# Patient Record
Sex: Female | Born: 1955 | Race: Black or African American | Hispanic: No | Marital: Single | Smoking: Former smoker
Health system: Southern US, Community
[De-identification: ages and names within clinical notes are randomized; demographics above are authoritative.]

## PROBLEM LIST (undated history)

## (undated) DIAGNOSIS — Z9221 Personal history of antineoplastic chemotherapy: Secondary | ICD-10-CM

## (undated) DIAGNOSIS — Z972 Presence of dental prosthetic device (complete) (partial): Secondary | ICD-10-CM

## (undated) DIAGNOSIS — K219 Gastro-esophageal reflux disease without esophagitis: Secondary | ICD-10-CM

## (undated) DIAGNOSIS — E7800 Pure hypercholesterolemia, unspecified: Secondary | ICD-10-CM

## (undated) DIAGNOSIS — Z853 Personal history of malignant neoplasm of breast: Secondary | ICD-10-CM

## (undated) DIAGNOSIS — M5030 Other cervical disc degeneration, unspecified cervical region: Secondary | ICD-10-CM

## (undated) DIAGNOSIS — C50919 Malignant neoplasm of unspecified site of unspecified female breast: Secondary | ICD-10-CM

## (undated) DIAGNOSIS — Z8489 Family history of other specified conditions: Secondary | ICD-10-CM

## (undated) DIAGNOSIS — R0602 Shortness of breath: Secondary | ICD-10-CM

## (undated) DIAGNOSIS — H269 Unspecified cataract: Secondary | ICD-10-CM

## (undated) DIAGNOSIS — R0989 Other specified symptoms and signs involving the circulatory and respiratory systems: Secondary | ICD-10-CM

## (undated) DIAGNOSIS — J449 Chronic obstructive pulmonary disease, unspecified: Secondary | ICD-10-CM

## (undated) DIAGNOSIS — K08109 Complete loss of teeth, unspecified cause, unspecified class: Secondary | ICD-10-CM

## (undated) DIAGNOSIS — R05 Cough: Secondary | ICD-10-CM

## (undated) HISTORY — DX: Chronic obstructive pulmonary disease, unspecified: J44.9

## (undated) HISTORY — DX: Gastro-esophageal reflux disease without esophagitis: K21.9

## (undated) HISTORY — PX: COLONOSCOPY: SHX174

## (undated) HISTORY — PX: REDUCTION MAMMAPLASTY: SUR839

## (undated) HISTORY — PX: TUBAL LIGATION: SHX77

## (undated) SURGERY — RECONSTRUCTION, BREAST
Anesthesia: General | Laterality: Right

---

## 1995-09-24 DIAGNOSIS — Z9221 Personal history of antineoplastic chemotherapy: Secondary | ICD-10-CM

## 1995-09-24 HISTORY — PX: BREAST SURGERY: SHX581

## 1995-09-24 HISTORY — DX: Personal history of antineoplastic chemotherapy: Z92.21

## 1995-09-24 HISTORY — PX: MASTECTOMY, PARTIAL: SHX709

## 1997-11-17 ENCOUNTER — Ambulatory Visit (HOSPITAL_COMMUNITY): Admission: RE | Admit: 1997-11-17 | Discharge: 1997-11-17 | Payer: Self-pay | Admitting: General Surgery

## 1998-04-22 ENCOUNTER — Emergency Department (HOSPITAL_COMMUNITY): Admission: EM | Admit: 1998-04-22 | Discharge: 1998-04-22 | Payer: Self-pay | Admitting: Emergency Medicine

## 1998-11-13 ENCOUNTER — Ambulatory Visit (HOSPITAL_COMMUNITY): Admission: RE | Admit: 1998-11-13 | Discharge: 1998-11-13 | Payer: Self-pay | Admitting: General Surgery

## 1998-11-13 ENCOUNTER — Encounter (HOSPITAL_BASED_OUTPATIENT_CLINIC_OR_DEPARTMENT_OTHER): Payer: Self-pay | Admitting: General Surgery

## 2001-03-20 ENCOUNTER — Ambulatory Visit (HOSPITAL_COMMUNITY): Admission: RE | Admit: 2001-03-20 | Discharge: 2001-03-20 | Payer: Self-pay | Admitting: General Surgery

## 2001-03-20 ENCOUNTER — Encounter (HOSPITAL_BASED_OUTPATIENT_CLINIC_OR_DEPARTMENT_OTHER): Payer: Self-pay | Admitting: General Surgery

## 2001-08-25 ENCOUNTER — Emergency Department (HOSPITAL_COMMUNITY): Admission: EM | Admit: 2001-08-25 | Discharge: 2001-08-25 | Payer: Self-pay | Admitting: Emergency Medicine

## 2002-01-02 ENCOUNTER — Emergency Department (HOSPITAL_COMMUNITY): Admission: EM | Admit: 2002-01-02 | Discharge: 2002-01-02 | Payer: Self-pay | Admitting: *Deleted

## 2002-01-02 ENCOUNTER — Encounter: Payer: Self-pay | Admitting: *Deleted

## 2002-01-04 ENCOUNTER — Emergency Department (HOSPITAL_COMMUNITY): Admission: EM | Admit: 2002-01-04 | Discharge: 2002-01-04 | Payer: Self-pay

## 2002-05-19 ENCOUNTER — Encounter (HOSPITAL_BASED_OUTPATIENT_CLINIC_OR_DEPARTMENT_OTHER): Payer: Self-pay | Admitting: General Surgery

## 2002-05-19 ENCOUNTER — Encounter: Admission: RE | Admit: 2002-05-19 | Discharge: 2002-05-19 | Payer: Self-pay | Admitting: General Surgery

## 2004-11-14 ENCOUNTER — Emergency Department (HOSPITAL_COMMUNITY): Admission: EM | Admit: 2004-11-14 | Discharge: 2004-11-14 | Payer: Self-pay | Admitting: Family Medicine

## 2006-04-18 ENCOUNTER — Inpatient Hospital Stay (HOSPITAL_COMMUNITY): Admission: EM | Admit: 2006-04-18 | Discharge: 2006-04-21 | Payer: Self-pay | Admitting: Emergency Medicine

## 2006-11-02 ENCOUNTER — Emergency Department (HOSPITAL_COMMUNITY): Admission: EM | Admit: 2006-11-02 | Discharge: 2006-11-02 | Payer: Self-pay | Admitting: Emergency Medicine

## 2006-12-16 ENCOUNTER — Emergency Department (HOSPITAL_COMMUNITY): Admission: EM | Admit: 2006-12-16 | Discharge: 2006-12-16 | Payer: Self-pay | Admitting: Family Medicine

## 2006-12-18 ENCOUNTER — Encounter: Admission: RE | Admit: 2006-12-18 | Discharge: 2006-12-18 | Payer: Self-pay | Admitting: Family Medicine

## 2007-07-06 ENCOUNTER — Emergency Department (HOSPITAL_COMMUNITY): Admission: EM | Admit: 2007-07-06 | Discharge: 2007-07-06 | Payer: Self-pay | Admitting: Emergency Medicine

## 2008-01-05 ENCOUNTER — Encounter: Admission: RE | Admit: 2008-01-05 | Discharge: 2008-01-05 | Payer: Self-pay | Admitting: General Surgery

## 2008-01-09 ENCOUNTER — Emergency Department (HOSPITAL_COMMUNITY): Admission: EM | Admit: 2008-01-09 | Discharge: 2008-01-09 | Payer: Self-pay | Admitting: Emergency Medicine

## 2008-01-11 ENCOUNTER — Emergency Department (HOSPITAL_COMMUNITY): Admission: EM | Admit: 2008-01-11 | Discharge: 2008-01-11 | Payer: Self-pay | Admitting: Emergency Medicine

## 2008-01-20 ENCOUNTER — Emergency Department (HOSPITAL_COMMUNITY): Admission: EM | Admit: 2008-01-20 | Discharge: 2008-01-20 | Payer: Self-pay | Admitting: Emergency Medicine

## 2009-01-08 ENCOUNTER — Emergency Department (HOSPITAL_COMMUNITY): Admission: EM | Admit: 2009-01-08 | Discharge: 2009-01-08 | Payer: Self-pay | Admitting: Emergency Medicine

## 2009-12-22 ENCOUNTER — Emergency Department (HOSPITAL_COMMUNITY): Admission: EM | Admit: 2009-12-22 | Discharge: 2009-12-22 | Payer: Self-pay | Admitting: Emergency Medicine

## 2010-07-27 ENCOUNTER — Observation Stay (HOSPITAL_COMMUNITY): Admission: EM | Admit: 2010-07-27 | Discharge: 2010-07-27 | Payer: Self-pay | Admitting: Emergency Medicine

## 2010-11-04 ENCOUNTER — Emergency Department (HOSPITAL_COMMUNITY)
Admission: EM | Admit: 2010-11-04 | Discharge: 2010-11-04 | Disposition: A | Discharge status: Home / Self Care | Payer: Self-pay | Attending: Emergency Medicine | Admitting: Emergency Medicine

## 2010-11-04 ENCOUNTER — Emergency Department (HOSPITAL_COMMUNITY): Payer: Self-pay

## 2010-11-04 DIAGNOSIS — J4489 Other specified chronic obstructive pulmonary disease: Secondary | ICD-10-CM | POA: Insufficient documentation

## 2010-11-04 DIAGNOSIS — R0609 Other forms of dyspnea: Secondary | ICD-10-CM | POA: Insufficient documentation

## 2010-11-04 DIAGNOSIS — R05 Cough: Secondary | ICD-10-CM | POA: Insufficient documentation

## 2010-11-04 DIAGNOSIS — Z853 Personal history of malignant neoplasm of breast: Secondary | ICD-10-CM | POA: Insufficient documentation

## 2010-11-04 DIAGNOSIS — R0602 Shortness of breath: Secondary | ICD-10-CM | POA: Insufficient documentation

## 2010-11-04 DIAGNOSIS — J449 Chronic obstructive pulmonary disease, unspecified: Secondary | ICD-10-CM | POA: Insufficient documentation

## 2010-11-04 DIAGNOSIS — R64 Cachexia: Secondary | ICD-10-CM | POA: Insufficient documentation

## 2010-11-04 DIAGNOSIS — R059 Cough, unspecified: Secondary | ICD-10-CM | POA: Insufficient documentation

## 2010-11-04 DIAGNOSIS — R0989 Other specified symptoms and signs involving the circulatory and respiratory systems: Secondary | ICD-10-CM | POA: Insufficient documentation

## 2010-11-04 LAB — POCT I-STAT, CHEM 8
BUN: 16 mg/dL (ref 6–23)
Chloride: 105 mEq/L (ref 96–112)
HCT: 50 % — ABNORMAL HIGH (ref 36.0–46.0)
Potassium: 4.1 mEq/L (ref 3.5–5.1)
Sodium: 140 mEq/L (ref 135–145)

## 2010-11-15 ENCOUNTER — Emergency Department (HOSPITAL_COMMUNITY)
Admission: EM | Admit: 2010-11-15 | Discharge: 2010-11-15 | Disposition: A | Discharge status: Home / Self Care | Payer: No Typology Code available for payment source | Attending: Emergency Medicine | Admitting: Emergency Medicine

## 2010-11-15 ENCOUNTER — Emergency Department (HOSPITAL_COMMUNITY): Payer: No Typology Code available for payment source

## 2010-11-15 DIAGNOSIS — R071 Chest pain on breathing: Secondary | ICD-10-CM | POA: Insufficient documentation

## 2010-11-15 DIAGNOSIS — S139XXA Sprain of joints and ligaments of unspecified parts of neck, initial encounter: Secondary | ICD-10-CM | POA: Insufficient documentation

## 2010-11-15 DIAGNOSIS — M25519 Pain in unspecified shoulder: Secondary | ICD-10-CM | POA: Insufficient documentation

## 2010-11-15 DIAGNOSIS — Y92410 Unspecified street and highway as the place of occurrence of the external cause: Secondary | ICD-10-CM | POA: Insufficient documentation

## 2010-11-20 ENCOUNTER — Emergency Department (HOSPITAL_COMMUNITY)
Admission: EM | Admit: 2010-11-20 | Discharge: 2010-11-20 | Disposition: A | Discharge status: Home / Self Care | Payer: No Typology Code available for payment source | Attending: Emergency Medicine | Admitting: Emergency Medicine

## 2010-11-20 DIAGNOSIS — R109 Unspecified abdominal pain: Secondary | ICD-10-CM | POA: Insufficient documentation

## 2010-11-20 LAB — URINALYSIS, ROUTINE W REFLEX MICROSCOPIC
Ketones, ur: NEGATIVE mg/dL
Leukocytes, UA: NEGATIVE
Nitrite: NEGATIVE
Specific Gravity, Urine: 1.026 (ref 1.005–1.030)
Urine Glucose, Fasting: NEGATIVE mg/dL
pH: 5 (ref 5.0–8.0)

## 2010-11-20 LAB — URINE MICROSCOPIC-ADD ON

## 2010-11-21 LAB — URINE CULTURE: Culture  Setup Time: 201202290133

## 2010-12-04 LAB — DIFFERENTIAL
Lymphocytes Relative: 41 % (ref 12–46)
Lymphs Abs: 1.7 10*3/uL (ref 0.7–4.0)
Neutrophils Relative %: 46 % (ref 43–77)

## 2010-12-04 LAB — BASIC METABOLIC PANEL
Calcium: 8.9 mg/dL (ref 8.4–10.5)
Chloride: 111 mEq/L (ref 96–112)
Creatinine, Ser: 0.8 mg/dL (ref 0.4–1.2)
GFR calc Af Amer: >60 mL/min (ref >60)
Sodium: 143 mEq/L (ref 135–145)

## 2010-12-04 LAB — CBC
Hemoglobin: 12.7 g/dL (ref 12.0–15.0)
MCV: 79.1 fL (ref 78.0–100.0)
Platelets: 214 10*3/uL (ref 150–400)
RBC: 4.94 MIL/uL (ref 3.87–5.11)
WBC: 4.1 10*3/uL (ref 4.0–10.5)

## 2011-02-08 NOTE — Discharge Summary (Signed)
NAMECATHE, Mary Jenkins                ACCOUNT NO.:  [id number]   MEDICAL RECORD NO.:  [id number]          PATIENT TYPE:  INP   LOCATION:  1407                         FACILITY:  [facility]   PHYSICIAN:  Corinna L. Conley Canal, MDDATE OF BIRTH:  [birth year]   DATE OF ADMISSION:  04/18/2006  DATE OF DISCHARGE:  04/21/2006                                 DISCHARGE SUMMARY   DISCHARGE DIAGNOSES:  1.  Pneumonia.  2.  Chronic hypotension.  3.  Cocaine abuse.  4.  Tobacco abuse.   DISCHARGE MEDICATIONS:  1.  Azithromycin 250 mg p.o. for one more day.  2.  Ceftin 500 mg p.o. b.i.d. for five more days.   FOLLOWUP:  With [facility].   DIET:  Regular.   ACTIVITY:  No heavy exertion for a week.  She may return to work in a week.   CONDITION:  Stable.   CONSULTATIONS:  None.   PROCEDURE:  None.   PERTINENT LABORATORY DATA:  On admission, CBC was significant for a white  blood cell count of 11,000 with 89% neutrophils, 7% lymphocytes.  Basic  metabolic panel was fairly unremarkable.  Urine drug screen positive for  cocaine and opiates, but after having received morphine.  UA revealed small  blood, negative nitrite, negative leukocyte esterase, negative ketones,  negative protein.  Chest x-ray showed COPD, increased opacities in the left  mid lung zone worrisome for developing pneumonia.   HISTORY AND HOSPITAL COURSE:  This is a 55 year old unassigned black female  without a primary care physician who presented to the emergency room with  cough and chest pain.  The ED physician reported hypotension and hypoxemia,  although this was not on any of the nursing documentation.  She reportedly  dropped into the oxygen saturations of 88th percentile range on room air,  but again the nurses did not confirm this.  She also reportedly dropped into  the 41P systolic blood pressure but the lowest blood pressure that the  nurses recorded was 89.  The patient reported that she runs chronically low  blood pressures.  She had been coughing and had chest pain, generalized  malaise for three days.  She also had a fever.  Please see my H&P for  admission details.  Her heart rate was 104, respiratory rate 20.  His  temperature was 102.3.  She was very thin and had pursed-lip breathing.  She  would not take a deep breath.  She was admitted for pneumonia, given IV  Rocephin, and azithromycin, placed on telemetry, and she was given IV  fluids.  She improved.  Her blood pressure remained marginal, but she was  asymptomatic and I suspect this was her baseline of about 85 to 90 systolic.  She was noted to be very thin and I have ordered a HIV test which she  refused.  I did order a urine drug screen, which is positive for cocaine.  She had already received morphine and so of course her urine drug screen was  positive for opiates.  She denied using cocaine.  However, she is a smoker  and  was counseled against. On the 29th of July, she was feeling better,  tolerating a diet.  I will Hep-lock her IV, change her antibiotics over to  oral, however he ambulate and discontinue the telemetry. I suspect she can  go home in the morning, if stable.      Corinna L. Conley Canal, MD  Electronically Signed     CLS/MEDQ  D:  04/20/2006  T:  04/20/2006  Job:  [id number]   cc:   [facility]

## 2011-02-08 NOTE — H&P (Signed)
Mary Jenkins, Mary Jenkins                ACCOUNT NO.:  [id number]   MEDICAL RECORD NO.:  [id number]          PATIENT TYPE:  EMS   LOCATION:  ED                           FACILITY:  [facility]   PHYSICIAN:  Corinna L. Conley Canal, MDDATE OF BIRTH:  1956-08-30   DATE OF ADMISSION:  04/18/2006  DATE OF DISCHARGE:                                HISTORY & PHYSICAL   CHIEF COMPLAINT:  Cough.   HISTORY OF PRESENT ILLNESS:  Mary Jenkins is an unassigned 55 year old black  female smoker who presents with a 3-day history of cough, pleuritic chest  pain, fever.  She also is short of breath.   PAST MEDICAL HISTORY:  Significant for breast cancer, status post  mastectomy.   MEDICATIONS:  None.   SOCIAL HISTORY:  She smokes a pack of cigarettes a day.  She does not drink  or use drugs.  She is a Secretary/administrator.   FAMILY HISTORY:  Her mother had a stroke.   REVIEW OF SYSTEMS:  As above, otherwise negative.   PHYSICAL EXAMINATION:  VITAL SIGNS:  Her oxygen saturations are recorded at  98% on 2 L per the ED physician.  She dropped into the 80s off oxygen but I  do not see this recorded anywhere.  She also reportedly dropped into the 35T  systolic blood pressure but I do not see this recorded.  She has been 95  systolic blood pressure here, heart rate 104, respiratory rate 20.  GENERAL:  The patient is thin black female who has pursed-lips breathing.  HEENT: Normocephalic, atraumatic.  Pupils equal, round and reactive to  light.  She has dentures.  Moist mucous membranes.  NECK:  Supple.  No lymphadenopathy.  LUNGS:  Poor inspiratory effort.  No wheeze, no rhonchi.  CARDIOVASCULAR:  Regular rate and rhythm without murmurs, gallops or rubs.  ABDOMEN:  Normal bowel sounds, soft, nontender, nondistended.  GENITOURINARY, RECTAL:  Deferred.  EXTREMITIES:  No clubbing, cyanosis or edema.  SKIN:  No rash.  PSYCHIATRIC:  Normal affect.  NEUROLOGIC:  Alert and oriented.  Cranial nerves and sensorimotor exam are  intact.   LABORATORY DATA:  Her CBC is significant for a white blood cell count of  11,000, 89% neutrophils, 7% lymphocytes, otherwise unremarkable.  Urine  pregnancy negative.  Sodium is 133, potassium 3.5, BMET otherwise  unremarkable.  UA is negative for nitrite, leukocyte esterase.  Chest x-ray  shows COPD with increased opacities in the left midlung zone.   ASSESSMENT/PLAN:  1.  Pneumonia with questionable hypoxia and hypotension.  I cannot find      record of this, but I will admit the patient to telemetry.  She will get      IV fluids, IV antibiotics.  She has had blood cultures drawn.  I will      give pain medication as needed for the pleuritic pain.  2.  Tobacco abuse, counseled against.  In addition to above, I will check      urine drug screen and HIV.  The patient reports that she is always thin,      but she  looks near-emaciated on exam.      Corinna L. Conley Canal, MD  Electronically Signed     CLS/MEDQ  D:  04/18/2006  T:  04/18/2006  Job:  [id number]

## 2011-06-01 ENCOUNTER — Emergency Department (HOSPITAL_COMMUNITY): Payer: Medicaid Other

## 2011-06-01 ENCOUNTER — Emergency Department (HOSPITAL_COMMUNITY)
Admission: EM | Admit: 2011-06-01 | Discharge: 2011-06-01 | Disposition: A | Discharge status: Home / Self Care | Payer: Medicaid Other | Attending: Emergency Medicine | Admitting: Emergency Medicine

## 2011-06-01 DIAGNOSIS — J4489 Other specified chronic obstructive pulmonary disease: Secondary | ICD-10-CM | POA: Insufficient documentation

## 2011-06-01 DIAGNOSIS — J449 Chronic obstructive pulmonary disease, unspecified: Secondary | ICD-10-CM | POA: Insufficient documentation

## 2011-06-01 DIAGNOSIS — Z853 Personal history of malignant neoplasm of breast: Secondary | ICD-10-CM | POA: Insufficient documentation

## 2011-06-01 DIAGNOSIS — R0989 Other specified symptoms and signs involving the circulatory and respiratory systems: Secondary | ICD-10-CM | POA: Insufficient documentation

## 2011-06-01 DIAGNOSIS — R0609 Other forms of dyspnea: Secondary | ICD-10-CM | POA: Insufficient documentation

## 2011-06-01 DIAGNOSIS — R0602 Shortness of breath: Secondary | ICD-10-CM | POA: Insufficient documentation

## 2011-06-01 LAB — CBC
HCT: 40.3 % (ref 36.0–46.0)
Hemoglobin: 13.9 g/dL (ref 12.0–15.0)
MCH: 27.1 pg (ref 26.0–34.0)
MCHC: 34.5 g/dL (ref 30.0–36.0)
MCV: 78.6 fL (ref 78.0–100.0)
Platelets: 241 10*3/uL (ref 150–400)
RBC: 5.13 MIL/uL — ABNORMAL HIGH (ref 3.87–5.11)
RDW: 15.3 % (ref 11.5–15.5)
WBC: 5 10*3/uL (ref 4.0–10.5)

## 2011-06-01 LAB — POCT I-STAT, CHEM 8
BUN: 14 mg/dL (ref 6–23)
Calcium, Ion: 1.17 mmol/L (ref 1.12–1.32)
Chloride: 105 meq/L (ref 96–112)
Creatinine, Ser: 0.8 mg/dL (ref 0.50–1.10)
Glucose, Bld: 89 mg/dL (ref 70–99)
HCT: 46 % (ref 36.0–46.0)
Hemoglobin: 15.6 g/dL — ABNORMAL HIGH (ref 12.0–15.0)
Potassium: 4.2 meq/L (ref 3.5–5.1)
Sodium: 141 meq/L (ref 135–145)
TCO2: 26 mmol/L (ref 0–100)

## 2011-06-13 ENCOUNTER — Inpatient Hospital Stay (HOSPITAL_COMMUNITY)
Admission: EM | Admit: 2011-06-13 | Discharge: 2011-06-16 | DRG: 192 | Disposition: A | Discharge status: Home / Self Care | Payer: Medicaid Other | Attending: Internal Medicine | Admitting: Internal Medicine

## 2011-06-13 ENCOUNTER — Emergency Department (HOSPITAL_COMMUNITY): Payer: Medicaid Other

## 2011-06-13 ENCOUNTER — Encounter: Payer: Self-pay | Admitting: Internal Medicine

## 2011-06-13 ENCOUNTER — Encounter (HOSPITAL_COMMUNITY): Payer: Self-pay | Admitting: Radiology

## 2011-06-13 DIAGNOSIS — J441 Chronic obstructive pulmonary disease with (acute) exacerbation: Principal | ICD-10-CM | POA: Diagnosis present

## 2011-06-13 DIAGNOSIS — R935 Abnormal findings on diagnostic imaging of other abdominal regions, including retroperitoneum: Secondary | ICD-10-CM | POA: Diagnosis present

## 2011-06-13 DIAGNOSIS — Z853 Personal history of malignant neoplasm of breast: Secondary | ICD-10-CM

## 2011-06-13 HISTORY — DX: Malignant neoplasm of unspecified site of unspecified female breast: C50.919

## 2011-06-13 HISTORY — DX: Personal history of antineoplastic chemotherapy: Z92.21

## 2011-06-13 LAB — DIFFERENTIAL
Basophils Absolute: 0.1 10*3/uL (ref 0.0–0.1)
Basophils Relative: 1 % (ref 0–1)
Eosinophils Absolute: 0.6 10*3/uL (ref 0.0–0.7)
Eosinophils Relative: 7 % — ABNORMAL HIGH (ref 0–5)
Monocytes Absolute: 0.7 10*3/uL (ref 0.1–1.0)

## 2011-06-13 LAB — CBC
MCHC: 34.3 g/dL (ref 30.0–36.0)
RDW: 16.2 % — ABNORMAL HIGH (ref 11.5–15.5)

## 2011-06-13 LAB — BASIC METABOLIC PANEL
BUN: 14 mg/dL (ref 6–23)
GFR calc Af Amer: >60 mL/min (ref >60)
GFR calc non Af Amer: >60 mL/min (ref >60)
Potassium: 3.7 mEq/L (ref 3.5–5.1)
Sodium: 140 mEq/L (ref 135–145)

## 2011-06-13 LAB — POCT I-STAT 3, ART BLOOD GAS (G3+)
Acid-Base Excess: 2 mmol/L (ref 0.0–2.0)
pCO2 arterial: 50.5 mmHg — ABNORMAL HIGH (ref 35.0–45.0)
pO2, Arterial: 284 mmHg — ABNORMAL HIGH (ref 80.0–100.0)

## 2011-06-13 MED ORDER — IOHEXOL 300 MG/ML  SOLN
100.0000 mL | Freq: Once | INTRAMUSCULAR | Status: AC | PRN
  Administered 2011-06-13: 100 mL via INTRAVENOUS

## 2011-06-13 NOTE — H&P (Signed)
Hospital Admission Note Date: 06/13/2011  Patient name: Mary Jenkins Medical record number: [id number] Date of birth: [birth year] Age: 55 y.o. Gender: female PCP: No primary provider on file.  Medical Service:  General Medicine Teaching Service B1  Attending physician:   Mary Jenkins Resident [phone]):   Mary Jenkins  Pager:  [phone] Resident (R1):   Mary Jenkins   Pager:  [phone]  Chief Complaint: Shortness of Breath  History of Present Illness: Mary Jenkins is a 55 y/o woman with a history of COPD, past heavy tobacco use, past crack cocaine use, and past breast cancer s/p mastectomy and chemotherapy who presents with increased shortness of breath that started today.  She was on the couch watching TV when she started to have the shortness of breath.  She also experienced cough, headache, and diaphoresis starting at the same time.  She went to lay down, but the SOB did not improve significantly.  Due to the severity, her family called EMS as they did not think it was safe to take her to the hospital themselves without supplemental oxygen.  No CP, fevers, or chills.  No runny nose or sore throat. No new leg pain, but she does have chronic bilateral leg soreness at baseline.  She has no history of recent long car rides or travel, but she has been laying around a lot recently.  No sick contacts.    She has a history of multiple ED visits for COPD exacerbations, including one September 8th, 2012.  Since that time, her shortness of breath has remained worse than previous baseline, but not nearly as bad as it has been today.  At home she only uses an albuterol inhaler, no other medications.  At baseline, she does not need it every day, but since this recent ED visit she has needed her albuterol "too much" at home.    She has a history of smoking 3 ppd cigarettes for over 20 years before quitting two months ago.  She also has a past history of crack cocaine use, which she stopped about four  years ago.   She has no PFTs in our records, and she does not see a doctor outside of the hospital because she is still applying for Medicaid.    No abdominal pain, nausea, vomiting, constipation, diarrhea, hematuria, dysuria, numbness/tingling anywhere, or recent weight changes.    Meds: Albuterol Inhaler Q4HPRN  Allergies: No known drug allergies  Past Medical History: Breast Cancer s/p chemotherapy and mastectomy.  "many years ago" "no recurrence" COPD  Past Surgical History: L mastectomy  Family History: Some family members with diabetes HTN in multiple sisters  Social History: Smoked 3 ppd for over 20 years, quit two months ago Past history of crack cocaine use, quit 4 yrs ago Lives with her daughter Unemployed and uninsured, applying for disability and medicaid  Review of Systems: See HPI  Physical Exam: Vitals on arrival:  T 97.6, P 121, BP 194/136, R 36, SiO2 100% on 5L O2  General: alert, thin, and cooperative to examination.  Head: normocephalic and atraumatic.  Eyes: vision grossly intact, pupils equal, pupils round, pupils reactive to light, no injection and anicteric.  Mouth: pharynx pink and moist, no erythema, and no exudates.  Neck: full ROM, no JVD, and no carotid bruits.   Lungs: on BIPAP, increased respiratory effort with some accessory muscle use, decreased air movement, mild scattered crackles, few expiratory wheezes.  Heart: normal rate during exam, regular rhythm, no murmur, no  gallop, and no rub.  Abdomen: soft, non-tender, normal bowel sounds, no distention, no guarding, no rebound tenderness. Msk: no joint swelling, no joint warmth, and no redness over joints.  Pulses: 2+ DP/PT pulses bilaterally Extremities: No cyanosis, clubbing, edema Neurologic: alert & oriented X3, cranial nerves II-XII intact, strength 4/5 in upper extremities (patient very thin, poor musculature), sensation intact to light touch. Skin: turgor normal and no rashes.    Psych: Oriented X3, memory intact for recent and remote, good eye contact, not anxious appearing, and not depressed appearing.      Lab results: Basic Metabolic Panel:  [facility] 06/13/11 1926  NA 140  K 3.7  CL 104  CO2 29  GLUCOSE 116*  BUN 14  CREATININE 0.95  CALCIUM 8.9  MG --  PHOS --   CBC:  Basename 06/13/11 1926  WBC 9.0  NEUTROABS 4.4  HGB 13.5  HCT 39.4  MCV 78.5  PLT 262    ABG:  Result Name                               Result     Abnl   Normal Range     Units      Perf. Loc.  pH, Blood Gas                             7.359             7.350-7.400  pCO2                                      50.5       h      35.0-45.0        mmHg  pO2, Blood Gas                            284.0      h      80.0-100.0       mmHg  Bicarbonate                               28.5       h      20.0-24.0        mEq/L  TCO2                                      30                0-100            mmol/L  Acid-Base Excess                          2.0               0.0-2.0          mmol/L  Oxygen Saturation                         100.0                              %  Collection Site                           SEE NOTE.      RADIAL, ALLEN'S TEST ACCEPTABLE   Imaging results:  Dg Chest Portable 1 View  06/13/2011  *RADIOLOGY REPORT*  Clinical Data: 55 year old female with shortness of breath.  PORTABLE CHEST - 1 VIEW  Comparison: 06/01/2011  Findings: The cardiomediastinal silhouette is unremarkable. Moderate to severe emphysema is again identified. There is no evidence of focal airspace disease, pulmonary edema, pulmonary nodule/mass, pleural effusion, or pneumothorax. No acute bony abnormalities are identified. Right axillary surgical clips are again identified as well as a right breast prosthesis.  IMPRESSION: Emphysema without evidence of acute cardiopulmonary disease.  Original Report Authenticated By: Mary Jenkins, M.D.    Other results: EKG: Borderline right axis deviation,  normal rate, sinus rhythm, late R wave progression in precordial leads, T wave inversion in V2 and aVL, no other ST or T wave abnormalities.       Assessment & Plan by Problem:  Mary Jenkins is a 55 y/o woman with a history of COPD, past heavy tobacco use, past crack cocaine use, and past breast cancer s/p mastectomy and chemotherapy who presents with increased shortness of breath that started today.  Shortness of Breath:  The most likely cause of Ms. Savich's SOB is a COPD exacerbation, given her history of previous exacerbations and ED team's report that she dramatically improved in ED with albuterol nebulizer treatment.  No signs of pneumonia on chest XRay.  Congestive heart failure is unlikely no known history of CAD, HLD, or HTN and no peripheral edema on exam.  However, she has not seen an outpatient doctor for formal health screening, so she may have one of these conditions.  NICM would also be possible given her history of crack cocaine use.  Finally, PE is also a concern given her lack of more severe findings on lung auscultation and recent history of "laying around".  Her modified Geneva score indicates that she is intermediate probability for a PE.  Given this and her history of breast cancer and COPD, a D-Dimer screen would likely be positive in her regardless of if she has a PE, so will go straight to imaging.  Avelox IV (on BIPAP) Duonebs Q4H Solumedrol IV Progress of BIPAP to supplemental O2 as tolerated PA and Lat CXRay on the am CTA to rule out possible PE Pro-BNP CEs x 1 set UDS HIV Antibody Screen Zofran PRN nausea Tylenol PRN pain   COPD: Aside from this possible exacerbation, Ms. Tabet's COPD does not seem to be adequately controlled at home on just PRN albuterol inhaler.  She will likely need at lease one maintenance inhaler in addition to this on discharge.  She also needs to establish care with a PCP for management of her COPD.  She will likely need PFTs in the future after  exacerbation is resolved.  Will consult case management to see if she qualifies for home O2.     DVT Prophylaxis: Lovenox      R2/3______________________________      R1________________________________  ATTENDING: I performed and/or observed a history and physical examination of the patient.  I discussed the case with the residents as noted and reviewed the residents' notes.  I agree with the findings and plan--please refer to the attending physician note for more details.  Signature________________________________  Printed Name_____________________________

## 2011-06-14 ENCOUNTER — Inpatient Hospital Stay (HOSPITAL_COMMUNITY): Payer: Medicaid Other

## 2011-06-14 ENCOUNTER — Encounter (HOSPITAL_COMMUNITY): Payer: Self-pay | Admitting: Radiology

## 2011-06-14 DIAGNOSIS — J441 Chronic obstructive pulmonary disease with (acute) exacerbation: Secondary | ICD-10-CM

## 2011-06-14 LAB — PRO B NATRIURETIC PEPTIDE: Pro B Natriuretic peptide (BNP): 96.9 pg/mL (ref 0–125)

## 2011-06-14 LAB — RAPID URINE DRUG SCREEN, HOSP PERFORMED
Amphetamines: NOT DETECTED
Opiates: NOT DETECTED

## 2011-06-14 LAB — CBC
HCT: 37.7 % (ref 36.0–46.0)
Hemoglobin: 12.7 g/dL (ref 12.0–15.0)
MCH: 26.6 pg (ref 26.0–34.0)
MCV: 79 fL (ref 78.0–100.0)
Platelets: 246 10*3/uL (ref 150–400)
RBC: 4.77 MIL/uL (ref 3.87–5.11)

## 2011-06-14 LAB — BASIC METABOLIC PANEL
BUN: 12 mg/dL (ref 6–23)
CO2: 23 mEq/L (ref 19–32)
Calcium: 9 mg/dL (ref 8.4–10.5)
Creatinine, Ser: 0.9 mg/dL (ref 0.50–1.10)
Glucose, Bld: 154 mg/dL — ABNORMAL HIGH (ref 70–99)

## 2011-06-14 LAB — HIV ANTIBODY (ROUTINE TESTING W REFLEX): HIV: NONREACTIVE

## 2011-06-14 MED ORDER — IOHEXOL 300 MG/ML  SOLN
100.0000 mL | Freq: Once | INTRAMUSCULAR | Status: AC | PRN
  Administered 2011-06-14: 100 mL via INTRAVENOUS

## 2011-06-15 LAB — CBC
Hemoglobin: 11.2 g/dL — ABNORMAL LOW (ref 12.0–15.0)
Platelets: 233 10*3/uL (ref 150–400)
RBC: 4.28 MIL/uL (ref 3.87–5.11)
WBC: 30.4 10*3/uL — ABNORMAL HIGH (ref 4.0–10.5)

## 2011-06-15 LAB — BASIC METABOLIC PANEL
CO2: 29 mEq/L (ref 19–32)
Glucose, Bld: 130 mg/dL — ABNORMAL HIGH (ref 70–99)
Potassium: 4.4 mEq/L (ref 3.5–5.1)
Sodium: 141 mEq/L (ref 135–145)

## 2011-06-16 LAB — CBC
Hemoglobin: 10.4 g/dL — ABNORMAL LOW (ref 12.0–15.0)
MCH: 26 pg (ref 26.0–34.0)
MCV: 79.3 fL (ref 78.0–100.0)
RBC: 4 MIL/uL (ref 3.87–5.11)
WBC: 17.9 10*3/uL — ABNORMAL HIGH (ref 4.0–10.5)

## 2011-06-18 LAB — BASIC METABOLIC PANEL
BUN: 19
Chloride: 101
Glucose, Bld: 107 — ABNORMAL HIGH
Potassium: 4.2
Sodium: 136

## 2011-06-18 LAB — CBC
HCT: 42.5
Hemoglobin: 14.2
MCV: 80.4
Platelets: 248
WBC: 5.2

## 2011-06-18 LAB — DIFFERENTIAL
Eosinophils Absolute: 0
Eosinophils Relative: 0
Lymphocytes Relative: 20
Lymphs Abs: 1.1
Monocytes Absolute: 0.7
Monocytes Relative: 13 — ABNORMAL HIGH

## 2011-06-18 LAB — ETHANOL: Alcohol, Ethyl (B): <5

## 2011-06-18 LAB — POCT CARDIAC MARKERS
Operator id: 1211
Troponin i, poc: <0.05

## 2011-06-24 ENCOUNTER — Encounter: Payer: Self-pay | Admitting: Internal Medicine

## 2011-06-24 ENCOUNTER — Ambulatory Visit (INDEPENDENT_AMBULATORY_CARE_PROVIDER_SITE_OTHER): Payer: Medicaid Other | Admitting: Internal Medicine

## 2011-06-24 DIAGNOSIS — D72829 Elevated white blood cell count, unspecified: Secondary | ICD-10-CM | POA: Insufficient documentation

## 2011-06-24 DIAGNOSIS — K7689 Other specified diseases of liver: Secondary | ICD-10-CM | POA: Insufficient documentation

## 2011-06-24 DIAGNOSIS — J449 Chronic obstructive pulmonary disease, unspecified: Secondary | ICD-10-CM

## 2011-06-24 DIAGNOSIS — F17200 Nicotine dependence, unspecified, uncomplicated: Secondary | ICD-10-CM

## 2011-06-24 DIAGNOSIS — G4700 Insomnia, unspecified: Secondary | ICD-10-CM

## 2011-06-24 LAB — CBC WITH DIFFERENTIAL/PLATELET
Basophils Absolute: 0 10*3/uL (ref 0.0–0.1)
Eosinophils Relative: 1 % (ref 0–5)
HCT: 40.2 % (ref 36.0–46.0)
Hemoglobin: 12.9 g/dL (ref 12.0–15.0)
Lymphocytes Relative: 14 % (ref 12–46)
Lymphs Abs: 1 10*3/uL (ref 0.7–4.0)
MCV: 81.2 fL (ref 78.0–100.0)
Monocytes Absolute: 0.4 10*3/uL (ref 0.1–1.0)
Monocytes Relative: 5 % (ref 3–12)
Neutro Abs: 6 10*3/uL (ref 1.7–7.7)
RBC: 4.95 MIL/uL (ref 3.87–5.11)
WBC: 7.5 10*3/uL (ref 4.0–10.5)

## 2011-06-24 MED ORDER — NICOTINE 14 MG/24HR TD PT24: 1.0000 | MEDICATED_PATCH | TRANSDERMAL | Status: AC

## 2011-06-24 MED ORDER — ZOLPIDEM TARTRATE 5 MG PO TABS: 5.0000 mg | ORAL_TABLET | Freq: Every evening | ORAL | Status: DC | PRN

## 2011-06-24 MED ORDER — NICOTINE 21 MG/24HR TD PT24: 21.0000 | MEDICATED_PATCH | TRANSDERMAL | Status: AC

## 2011-06-24 MED ORDER — NICOTINE 7 MG/24HR TD PT24: 1.0000 | MEDICATED_PATCH | TRANSDERMAL | Status: AC

## 2011-06-24 NOTE — Progress Notes (Deleted)
  Subjective:    Patient ID: Mary Jenkins, female    DOB: [birth year], 55 y.o.   MRN: [id number]  HPI    Review of Systems     Objective:   Physical Exam        Assessment & Plan:

## 2011-06-24 NOTE — Progress Notes (Signed)
Subjective:   Patient ID: Mary Jenkins female   DOB: October 08, 1955 55 y.o.   MRN: [id number]  HPI: [name] is a 55 y.o. female who presented to the clinic for a hospital follow up ( hospital admission 9/20-9/23)She is seen in this clinic for the first time. She has multiple ED visits and admission for SOB most likely due COPD exacerbation. She never had a PFT done. She was discharged on steroid taper which she completed today and antibiotic therapy for a total of 7 days with doxycycline. She currently just using albuterol inhaler which was given from the hospital on day of discharge. She was not able to get medication for her nebulizer due to financial restrains.  She just got her Medicaid approved. She noted that she feels SOB and has some wheezing. She cut down on cigarettes from 2ppd before admission now down to some few cigarettes but she underlined she needs something to decrease the urge to smoke. She understands the importance to quit. She further noted significant trouble falling a sleep. She watch TV in bed but otherwise denies any further special activities in bed. She lives with her daughter and plays with her grandchildren but seems that is not making her tired enough. She would walk around and eat since she is not able to sleep.     Past Medical History  Diagnosis Date  . Breast cancer   . History of right mastectomy   . Personal history of chemotherapy    Current Outpatient Prescriptions  Medication Sig Dispense Refill  . albuterol (ACCUNEB) 1.25 MG/3ML nebulizer solution Take 1 ampule by nebulization every 6 (six) hours as needed.        Marland Kitchen albuterol (PROVENTIL HFA;VENTOLIN HFA) 108 (90 BASE) MCG/ACT inhaler Inhale 2 puffs into the lungs every 4 (four) hours as needed.        Marland Kitchen albuterol (PROVENTIL,VENTOLIN) 2 MG/5ML syrup Take 2 mg by mouth 3 (three) times daily.        Marland Kitchen doxycycline (VIBRA-TABS) 100 MG tablet Take 100 mg by mouth 2 (two) times daily. For 6 days.       Marland Kitchen  ipratropium (ATROVENT HFA) 17 MCG/ACT inhaler Inhale 2 puffs into the lungs 4 (four) times daily. Prn        . predniSONE (DELTASONE) 10 MG tablet Take 10 mg by mouth daily. 30 mg po for 3 days,  then 20 mg po for 3 days, then 10 mg for 3 days and then stop.        No family history on file. History   Social History  . Marital Status: Single    Spouse Name: N/A    Number of Children: N/A  . Years of Education: N/A   Social History Main Topics  . Smoking status: Former Smoker -- 41 years    Types: Cigarettes  . Smokeless tobacco: None   Comment: quit x 2 months  . Alcohol Use: No  . Drug Use: No  . Sexually Active: None   Other Topics Concern  . None   Social History Narrative  . None   Review of Systems: Constitutional: Denies fever, chills, diaphoresis, appetite change and fatigue.  Respiratory: Noted SOB, DOE, cough, chest tightness,  and wheezing occasionally.    Cardiovascular: Denies chest pain, palpitations and leg swelling.  Gastrointestinal: Denies nausea, vomiting, abdominal pain, diarrhea, constipation, blood in stool and abdominal distention.   Musculoskeletal: Denies myalgias, back pain, joint swelling, arthralgias and gait problem.  Neurological: Denies  dizziness, seizures, syncope, weakness, light-headedness, numbness and headaches.   Objective:  Physical Exam: Filed Vitals:   06/24/11 1606  BP: 112/72  Pulse: 93  Temp: 98 F (36.7 C)  TempSrc: Oral  Height: 5' 5.5" (1.664 m)  Weight: 92 lb 4.8 oz (41.867 kg)  SpO2: 95%   Constitutional: Vital signs reviewed.  Patient is a well-developed and well-nourished in no acute distress and cooperative with exam. Alert and oriented x3.  Head: Normocephalic and atraumatic Ear: TM normal bilaterally Mouth: no erythema or exudates, MMM Eyes: PERRL, EOMI, conjunctivae normal, No scleral icterus.  Neck: Supple, Trachea midline normal ROM, No JVD, mass, thyromegaly, or carotid bruit present.  Cardiovascular:  Tachycardic, S1 normal, S2 normal, no MRG, pulses symmetric and intact bilaterally Pulmonary/Chest: CTAB,  Wheezes noted  But no  rales, or rhonchi Abdominal: Soft. Non-tender, non-distended, bowel sounds are normal, no masses, organomegaly, or guarding present.  GU: no CVA tenderness Musculoskeletal: No joint deformities, erythema, or stiffness, ROM full and no nontender.  Neurological: A&O x3, Strenght is normal and symmetric bilaterally,no focal motor deficit, sensory intact to light touch bilaterally.  Skin: Warm, dry and intact. No rash, cyanosis, or clubbing.

## 2011-06-25 ENCOUNTER — Encounter: Payer: Self-pay | Admitting: Internal Medicine

## 2011-06-25 NOTE — Assessment & Plan Note (Signed)
Patient was noted to have leucocytosis likely due to steroids. Will reevaluate today and will repeat a CBC

## 2011-06-25 NOTE — Assessment & Plan Note (Signed)
Patient was smoking for 40 years 2-3 packs a day. She quit 2 month ago but started to smoke again some few cigarettes. She expressed wishes to quit and wanted to try nicotine patches at this point. I will start a 21 day taper starting with 21 mg then 14 mg and then 7 mg. She did not want any further help but she would think about it at this point.

## 2011-06-25 NOTE — Assessment & Plan Note (Signed)
It is not clear if patient has COPD but most likely. Patient had multiple admission and ED visit. Patient was not seeing a PCP on a regular basis. Patient was discharged for possible COPD exacerbation 0n 9/23 with steroid taper for 12 days, Doxycyline for 7 days, albuterol nebs scheduled and Atrovent and Albuterol inhaler prn. Patient completed the steroid and antibiotic therapy course but was not able to get albuterol neb medication due to financial restriction. She got medicaid approved . Will refer patient today for PFT which scheduled for Thursday and will determine possible changes in management at that time.

## 2011-06-25 NOTE — Assessment & Plan Note (Signed)
Patient noted that has been having chronic insomnia since years. We discussed sleep hygiene extensively and noted that Lorrin Mais was helping during the hospital admission.

## 2011-06-27 ENCOUNTER — Ambulatory Visit (HOSPITAL_COMMUNITY)
Admission: RE | Admit: 2011-06-27 | Discharge: 2011-06-27 | Disposition: A | Discharge status: Home / Self Care | Payer: Medicaid Other | Source: Ambulatory Visit | Attending: Internal Medicine | Admitting: Internal Medicine

## 2011-06-27 DIAGNOSIS — J449 Chronic obstructive pulmonary disease, unspecified: Secondary | ICD-10-CM | POA: Insufficient documentation

## 2011-06-27 DIAGNOSIS — J4489 Other specified chronic obstructive pulmonary disease: Secondary | ICD-10-CM | POA: Insufficient documentation

## 2011-06-27 LAB — PULMONARY FUNCTION TEST

## 2011-07-01 ENCOUNTER — Other Ambulatory Visit: Payer: Self-pay | Admitting: Internal Medicine

## 2011-07-01 ENCOUNTER — Other Ambulatory Visit: Payer: Self-pay | Admitting: *Deleted

## 2011-07-01 NOTE — Telephone Encounter (Signed)
Pt called needs to get a Nebulizer.  Had an old one that is not working.  Will come by to pick up a prescription for to take to [facility].

## 2011-07-01 NOTE — Telephone Encounter (Signed)
Will give a prescription for nebulizer.

## 2011-07-03 NOTE — Discharge Summary (Signed)
NAMEKELLSEY, SANSONE                ACCOUNT NO.:  [id number]  MEDICAL RECORD NO.:  [id number]  LOCATION:  6945                         FACILITY:  [facility]  PHYSICIAN:  Oval Linsey, MD     DATE OF BIRTH:  [birth year]  DATE OF ADMISSION:  06/13/2011 DATE OF DISCHARGE:  06/16/2011                              DISCHARGE SUMMARY   DISCHARGING DIAGNOSES: 1. Chronic obstructive pulmonary disease exacerbation. 2. Liver cyst.  DISCHARGE MEDICATIONS: 1. Albuterol 2.5 mg inhale four times daily. 2. Doxycycline 100 mg p.o. twice daily. 3. Atrovent 0.5 mg inhaled four times daily. 4. Prednisone 30 mg p.o. for 3 days, then take 20 mg p.o. for 3 days,     then take 10 mg p.o. for 3 days, and then stop. 5. Ventolin inhaler 2 puffs inhale every 4 hours p.r.n. for shortness     of breath.  DISPOSITION AND FOLLOWUP:  Ms. Vanecek has an appointment with Dr. Newt Lukes [facility] on June 24, 2011, at 3:45 p.m.  The liver lesions  seen on CT needed to be followed up in 3-6 months.  PROCEDURE PERFORMED IN THE HOSPITAL: 1. Chest portable x-ray:  Emphysema without evidence of acute     cardiopulmonary disease. 2. CT angiogram:  No pulmonary embolism or acute intrathoracic process     identified.  Central lobular emphysematous change.  A hypervascular     focus within the left hepatic lobe was accidentally found. 3. CT abdomen with or without contrast:  Hypervascular focus in the     left lobe of the liver was only identified on the portal venous     phase image and it favored to represent perfusion abnormalities,     also separate hypoattenuating lesions within the liver, favored to     represent small cysts or complex cysts, too small to entirely     characterize.  CONSULTATIONS:  None.  BRIEF ADMISSION HISTORY:  Ms. Hinkson is a 55 year old woman with a past medical history of COPD,  heavy tobacco use, crack cocaine use, and breast cancer status post  mastectomy and  chemotherapy, who presents with increased shortness of  breath.  She also experienced cough, headache, and diaphoresis starting  at the same time.  She went to lay down, but the shortness of breath  did not improve significantly.  Due to the severity, her family called  EMS.  She denies chest pain, fevers, chills, runny nose sore throat, sick contacts, recent history of a long car ride or traveling, but has  been laying around a lot recently.  She has a history of multiple ED visits for COPD exacerbations, including on June 01, 2011.  Since then, her shortness of breath has remained worse than previous baseline, but not nearly as bad as today.  At home, she only uses an albuterol inhaler.  At baseline,  she does not need it every day, but since her recent ED visit, she has  needed her albuterol too much at home per patient.  She has a history of smoking three packs a day for over 20 years before  quitting 2 months ago.  She also has a past  medical history of crack  cocaine use, which she stopped about 4 years ago.  She has no pulmonary function tests in our record.  PHYSICAL EXAMINATION:   ADMISSION VITAL SIGNS:  Temperature 97.6, blood pressure 194/136, heart rate  121, respiration rate 36, oxygen saturation 100% on 5 liters of oxygen. GENERAL:  Alert, sitting and cooperative to examination.  On BiPAP. HEENT:  PERRLA. Supple.  No JVD or carotid artery bruits. LUNGS:  Increased respiratory effort with accessory muscle use, decreased  air movement bilaterally, mild scattered crackles, very few expiratory wheezes. HEART:  S1, S2, regular rhythm and rate, no murmurs or gallops. ABDOMEN:  Soft, nontender, active bowel sounds, no distention, guarding, or  rebound pain. PULSES:  2+ DP/PT bilaterally. EXTREMITIES:  No cyanosis, clubbing, or edema. NEURO:  Alert, oriented x 3, cranial nerves II through XII grossly intact.   Muscle strength 4/5 in upper extremities. Sensation intact to  the light touch.  ADMISSION LABS:   WBC count 9.0, hemoglobin 13.5, hematocrit 39.4, platelet 262.   Sodium 140, potassium 3.7, chloride 104, bicarbonate 29, glucose 116, BUN 14,  creatinine 0.95, calcium 8.9.   Drug screen negative.   Cardiac enzyme CK-MB 2.29, troponin less than 0.3. BNP 96.9.   HIV antibody negative.  HOSPITAL COURSE BY PROBLEM: 1. Shortness of breath:  Ms. Martorano's shortness of breath was most     likely caused by a COPD exacerbation.  However, her Geneva     score suggested she has intermittent probability for PE.  CT     angiogram was negative for PE.  Her chest x-ray showed     emphysematous changes without evidence of acute cardiopulmonary     disease.  She was treated with antibiotics, steroids,     BiPAP, Ventolin, Atrovent, and Advair.  She responded well to      treatment.  At discharge, her shortness of breath dramatically      improved.  We changed her antibiotics from Avelox to p.o.      doxycycline for another 6 days.  She was also started on a      prednisone taper.  She has nebulizer machine at home and was placed     on albuterol and Atrovent nebulizer after discharge.  She will be      on p.r.n. Ventolin inhaler as well.  2. Liver cysts:  Ms. Rascon was incidentally found to have a     hypervascular focus in the left hepatic lobe on CT angiogram.  Liver      protocol CT scan showed that the hypervascular focus in the left     lobe of liver is only identified on the portal venous phase image     and was felt to represent a perfusion abnormality, also a     separate hypoattenuating lesion within the liver, favored to     represent a small cyst or complex cyst, too small to entirely     characterize.  These liver lesions need to be followed up in 3-6     months.  DISCHARGE VITAL SIGNS:  Temperature 98, blood pressure 113/72, pulse rate 71, respiration rate 16, oxygen saturation 98% on 2 liters oxygen.  DISCHARGE LABS: Sodium 141, potassium 4.4,  chloride 106, bicarbonate 29, glucose 130,  BUN 12, creatinine 0.77, calcium 8.8. WBC 17.9, hemoglobin 10.4, hematocrit 31.7, platelet 206.   ______________________________ Ivor Costa, MD   ______________________________ Oval Linsey, MD   XN/MEDQ  D:  06/23/2011  T:  06/23/2011  Job:  [id number]  cc:   Rosalia Hammers, MD  Electronically Signed by Ivor Costa MD on 06/24/2011 07:00:58 PM Electronically Signed by Oval Linsey  on 07/03/2011 10:09:03 AM

## 2011-07-09 ENCOUNTER — Emergency Department (HOSPITAL_COMMUNITY): Payer: Medicaid Other

## 2011-07-09 ENCOUNTER — Encounter: Payer: Self-pay | Admitting: Internal Medicine

## 2011-07-09 ENCOUNTER — Inpatient Hospital Stay (HOSPITAL_COMMUNITY)
Admission: EM | Admit: 2011-07-09 | Discharge: 2011-07-11 | DRG: 192 | Disposition: A | Discharge status: Home / Self Care | Payer: Medicaid Other | Source: Ambulatory Visit | Attending: Internal Medicine | Admitting: Internal Medicine

## 2011-07-09 DIAGNOSIS — Z9221 Personal history of antineoplastic chemotherapy: Secondary | ICD-10-CM

## 2011-07-09 DIAGNOSIS — Z853 Personal history of malignant neoplasm of breast: Secondary | ICD-10-CM

## 2011-07-09 DIAGNOSIS — J441 Chronic obstructive pulmonary disease with (acute) exacerbation: Secondary | ICD-10-CM

## 2011-07-09 DIAGNOSIS — Z79899 Other long term (current) drug therapy: Secondary | ICD-10-CM

## 2011-07-09 DIAGNOSIS — IMO0002 Reserved for concepts with insufficient information to code with codable children: Secondary | ICD-10-CM

## 2011-07-09 DIAGNOSIS — Z87891 Personal history of nicotine dependence: Secondary | ICD-10-CM

## 2011-07-09 DIAGNOSIS — Z9010 Acquired absence of unspecified breast and nipple: Secondary | ICD-10-CM

## 2011-07-09 DIAGNOSIS — G4700 Insomnia, unspecified: Secondary | ICD-10-CM | POA: Diagnosis present

## 2011-07-09 LAB — COMPREHENSIVE METABOLIC PANEL
ALT: 13 U/L (ref 0–35)
Calcium: 9.1 mg/dL (ref 8.4–10.5)
GFR calc Af Amer: 88 mL/min — ABNORMAL LOW (ref >90)
Glucose, Bld: 119 mg/dL — ABNORMAL HIGH (ref 70–99)
Sodium: 138 mEq/L (ref 135–145)
Total Protein: 7 g/dL (ref 6.0–8.3)

## 2011-07-09 LAB — LACTIC ACID, PLASMA: Lactic Acid, Venous: 1.8 mmol/L (ref 0.5–2.2)

## 2011-07-09 LAB — DIFFERENTIAL
Basophils Absolute: 0.1 10*3/uL (ref 0.0–0.1)
Basophils Relative: 1 % (ref 0–1)
Monocytes Absolute: 0.7 10*3/uL (ref 0.1–1.0)
Neutro Abs: 2.4 10*3/uL (ref 1.7–7.7)
Neutrophils Relative %: 45 % (ref 43–77)

## 2011-07-09 LAB — CK TOTAL AND CKMB (NOT AT ARMC)
CK, MB: 2.4 ng/mL (ref 0.3–4.0)
Relative Index: INVALID (ref 0.0–2.5)
Total CK: 91 U/L (ref 7–177)

## 2011-07-09 LAB — CBC
Hemoglobin: 12.1 g/dL (ref 12.0–15.0)
MCHC: 31.8 g/dL (ref 30.0–36.0)
RBC: 4.77 MIL/uL (ref 3.87–5.11)
WBC: 5.4 10*3/uL (ref 4.0–10.5)

## 2011-07-09 NOTE — H&P (Signed)
Hospital Admission Note Date: 07/09/2011  Patient name:  Mary Jenkins   Medical record number:  [id number] Date of birth:  1956/04/26  Age: 55 y.o. Gender:  female PCP:    Rosalia Hammers, MD  Medical Service:   Internal Medicine Teaching Service   Attending physician:  Dr. Michel Bickers First Contact:   Dr. Michail Sermon  Pager: [phone]  Second Contact:   Dr. Guy Sandifer Pager: [phone] After Hours:    First Contact   Pager: [phone]      Second Contact  Pager: [phone]   Chief Complaint: Shortness of breath  History of Present Illness:  Mary Jenkins is a 55 y/o woman with a history of COPD with multiple Ed visits and recent hospitalization (06/16/11) for exacerbation , past heavy tobacco use, past crack cocaine use, and breast cancer s/p mastectomy and chemotherapy in remote past who presents with increased shortness of breath that started 2 days ago and got progressively worse. She woke up this morning with worsening SOB. She has mild shortness of breath the night before she went to bed which has been bothering her for past 2 days. She took 3 Ambiens last night to go to sleep. She has also been having cough which was not productive until after coming to Ed when she had some yellowish expectoration. No CP, fevers, or chills. No runny nose or sore throat. No new leg pain, but she does have chronic bilateral leg soreness at baseline. She has no history of recent long car rides or travel, and she has been fairly ambulatory at home. No sick contacts.  She has been using albuterol inhaler and albuterol nebulizer four to five times a day since past 2 days without much improvement in symptoms. Before her last hospitalization, she was using her ventolin just once a day which has gone up to up to 4 times a day since discharge. She did complete her course of doxycycline and prednisone prescribed last time and started feeling worse since she stopped it. She has a history of smoking 3 ppd cigarettes for over 20  years before quitting two months ago. No abdominal pain, nausea, vomiting, constipation, diarrhea, hematuria, dysuria, numbness/tingling anywhere, or recent weight changes. She has received 58m Mag Sulfate, duonebs, 1227msolumedrol,1 L NS bolus and BiPAP since coming to Ed this am and is feeling significantly better now.   Current Outpatient Medications: Current Outpatient Prescriptions  Medication Sig Dispense Refill  . albuterol (ACCUNEB) 1.25 MG/3ML nebulizer solution Take 1 ampule by nebulization every 6 (six) hours as needed.        . Marland Kitchenlbuterol (PROVENTIL HFA;VENTOLIN HFA) 108 (90 BASE) MCG/ACT inhaler Inhale 2 puffs into the lungs every 4 (four) hours as needed.        . nicotine (NICODERM CQ - DOSED IN MG/24 HR) 7 mg/24hr patch Place 1 patch onto the skin daily.  30 patch  0  . zolpidem (AMBIEN) 5 MG tablet Take 1 tablet (5 mg total) by mouth at bedtime as needed for sleep.  30 tablet  0    Allergies: Allergies  Allergen Reactions  . Aspirin ? B/l flank pain     Past Medical History: Past Medical History  Diagnosis Date  . Breast Cancer s/p chemotherapy and mastectomy. "many years ago" "no recurrence"     . COPD (chronic obstructive pulmonary disease)- No PFTs. Multiple ED visit. Hospitalized 06/23/2011. No h/o intubation 06/24/2011  . Liver cyst ( A separate 4 mm hypoattenuating focus is identified in the medial segment left  lobe, too small to characterize. No follow up recommended)  06/24/2011  . Smoking addiction 06/24/2011  . Insomnia 06/24/2011    Past Surgical History:  Right mastectomy.   Family History: Family History  Problem Relation Age of Onset  . Hypertension Sister     Social History: Smoked 3 ppd for over 20 years, quit two months ago  Past history of crack cocaine use, quit 4 yrs ago  Lives with her daughter  Unemployed , has Medicaid.   Review of Systems: Constitutional:  Denies fever, chills,, appetite change and fatigue.  HEENT: Denies photophobia,  eye pain, redness, hearing loss, ear pain, congestion, sore throat, rhinorrhea, sneezing, mouth sores, trouble swallowing, neck pain, neck stiffness and tinnitus.  Respiratory: See HPI  Cardiovascular: Denies chest pain, palpitations and leg swelling.  Gastrointestinal: Denies nausea, vomiting, abdominal pain, diarrhea, constipation, blood in stool and abdominal distention.  Genitourinary: Denies dysuria, urgency, frequency, hematuria, flank pain and difficulty urinating.  Musculoskeletal: Denies myalgias, back pain, joint swelling, arthralgias and gait problem. Has b/l leg pain  Skin: Denies pallor, rash and wound.  Neurological: Denies dizziness, seizures, syncope, weakness, light-headedness, numbness and headaches.   Hematological: Denies adenopathy. Easy bruising, personal or family bleeding history.  Psychiatric/ Behavioral: Denies suicidal ideation, mood changes, confusion, nervousness, sleep disturbance and agitation.    Vital Signs: T: 98.2 P: 86-108 BP: 103-118/64-81 dropped to sys 80-90 in Ed which is responding to fluid bolus RR: 23 O2 sat: 100% RA on arrival to ED, BiPAP placed for increased work of breathing and reduced breath sound b/l   Physical Exam: General: Vital signs reviewed and noted. Well-developed, well-nourished, on BiPAP, no acute distress, able to speak in full sentences  Head: Normocephalic, atraumatic.  Eyes: PERRL, EOMI, No signs of anemia or jaundince.  Nose: Mucous membranes moist, not inflammed, nonerythematous.  Throat: Oropharynx nonerythematous, no exudate appreciated.   Neck: No deformities, masses, or tenderness noted.Supple, No carotid Bruits, no JVD.  Lungs:  Mildly SOB. Clear to auscultation BL without crackles or wheezes.  Heart: RRR, tachycardia. S1 and S2 normal without gallop, murmur, or rubs.  Abdomen:  BS normoactive. Soft, Nondistended, non-tender.  No masses or organomegaly.  Extremities: No pretibial edema.  Neurologic: A&O X3, CN II - XII  are grossly intact. Motor strength is 5/5 in the all 4 extremities, Sensations intact to light touch, Cerebellar signs negative.  Skin: No visible rashes, scars.   Lab results: Basic Metabolic Panel: Recent Labs  [facility] 07/09/11 0755   NA 138   K 3.9   CL 104   CO2 25   GLUCOSE 119*   BUN 12   CREATININE 0.85   CALCIUM 9.1   MG --   PHOS --   Liver Function Tests: Recent Labs  [facility] 07/09/11 0755   AST 20   ALT 13   ALKPHOS 74   BILITOT 0.3   PROT 7.0   ALBUMIN 3.4*   No results found for this basename: LIPASE:2,AMYLASE:2 in the last 72 hours CBC: Recent Labs  [facility] 07/09/11 0755   WBC 5.4   NEUTROABS 2.4   HGB 12.1   HCT 38.1   MCV 79.9   PLT 256   Cardiac Enzymes: Recent Labs  [facility] 07/09/11 0755   CKTOTAL 91   CKMB 2.4   CKMBINDEX --   TROPONINI <0.30    Imaging results:  Dg Chest Portable 1 View  07/09/2011  *RADIOLOGY REPORT*  Clinical Data: Shortness of breath, COPD, smoker, BiPap  PORTABLE  CHEST - 1 VIEW  Comparison: Portable exam 2952 hours compared to 06/14/2011  Findings: Normal heart size, mediastinal contours, and pulmonary vascularity. Severe emphysematous changes with minimal peribronchial thickening. No acute infiltrate, pleural effusion, or pneumothorax. Bones demineralized. Prior right mastectomy, axillary node dissection, and breast prosthesis.  IMPRESSION: Severe emphysematous changes consistent with COPD. No acute abnormalities.  Original Report Authenticated By: Burnetta Sabin, M.D.     Other results: EKG:  Problem List  1.COPD exacerbation 2. Tobacco abuse- has quit since past 2 months 3. Leg pain b/l- chronic 4. Insomnia  Assessment & Plan:  The most likely cause of Ms. Caraher's SOB is a COPD exacerbation, given her history of previous exacerbations and ED team's report that she dramatically improved in ED with albuterol nebulizer treatment. No signs of pneumonia on chest XRay. Congestive heart failure is unlikely no  known history of CAD, HLD, or HTN and no peripheral edema on exam. ACS is less likely given absense of chest pain, no EKG changes and negative cardiac enzymes. Finally, PE is also a concern given her h/o breast cancer but my suspicion is low because her symptoms are well explained by COPD exas, improvement with nebulizer and the fact that she had CT angiograms done in the past when she presented with SOB and were negative.  The cause of her COPD exascerbation is unclear to me at this time but it seems that she has not been taking advair or spiriva and her COPD is not well controlled at baseline.   Will start treating as COPD exascerbation with albuterol/atrovent nebs, rocephin and zithromax for CAP coverage although given the fact that she was in the health care setting in less than 3 months, she may need broader coverage if pneumonia is diagnosed.  Patient is currently on BiPAP which was intitated basically for increased work of breathing. Will try to wean her off BiPAP Duonebs Q4H  Solumedrol 60 mg IV q6 hr  UDS  HIV Antibody Screen  Zofran PRN nausea  Tylenol PRN pain Will need inhaled steroids when she is discharged for chronic management of COPD  2. Leg pain b/l- no evidence of DVT on physical exam and this problem has been persistent for a while. Tylenol for pain 3. Insomnia- will avoid ativan at this time until her respiratory function improves 4. DVT PPX - lovenox     Lester [place], M.D. (PGY3):  ____________________________________    Date/ Time:     ____________________________________        I have seen and examined the patient. I reviewed the resident/fellow note and agree with the findings and plan of care as documented. My additions and revisions are included.   Signature:  ____________________________________________     Internal Medicine Teaching Service Attending    Date:    ____________________________________________

## 2011-07-10 DIAGNOSIS — J441 Chronic obstructive pulmonary disease with (acute) exacerbation: Secondary | ICD-10-CM

## 2011-07-10 LAB — BASIC METABOLIC PANEL
Calcium: 8.9 mg/dL (ref 8.4–10.5)
Chloride: 107 mEq/L (ref 96–112)
Creatinine, Ser: 0.67 mg/dL (ref 0.50–1.10)
GFR calc non Af Amer: >90 mL/min (ref >90)
Potassium: 3.9 mEq/L (ref 3.5–5.1)

## 2011-07-10 LAB — EXPECTORATED SPUTUM ASSESSMENT W GRAM STAIN, RFLX TO RESP C

## 2011-07-10 LAB — CBC
Platelets: 237 10*3/uL (ref 150–400)
RDW: 15.8 % — ABNORMAL HIGH (ref 11.5–15.5)
WBC: 9.4 10*3/uL (ref 4.0–10.5)

## 2011-07-10 LAB — CORTISOL: Cortisol, Plasma: 29.2 ug/dL

## 2011-07-11 LAB — CBC
Hemoglobin: 10.2 g/dL — ABNORMAL LOW (ref 12.0–15.0)
MCH: 25.7 pg — ABNORMAL LOW (ref 26.0–34.0)
MCHC: 31.7 g/dL (ref 30.0–36.0)
RDW: 16.3 % — ABNORMAL HIGH (ref 11.5–15.5)

## 2011-07-11 LAB — LEGIONELLA ANTIGEN, URINE

## 2011-07-12 LAB — CULTURE, RESPIRATORY W GRAM STAIN: Culture: NORMAL

## 2011-07-15 ENCOUNTER — Telehealth: Payer: Self-pay | Admitting: *Deleted

## 2011-07-15 NOTE — Telephone Encounter (Signed)
Request refill on: Advair Diskus 250/50  Inhale 1 dose by moth twice daily  Qty 60 Last refill date 07/12/11

## 2011-07-15 NOTE — Telephone Encounter (Signed)
[place] made awared.

## 2011-07-15 NOTE — Telephone Encounter (Signed)
Patient is not on Advair at this point. Will discuss changes in management at the next office visit.

## 2011-07-16 ENCOUNTER — Other Ambulatory Visit: Payer: Self-pay | Admitting: Internal Medicine

## 2011-07-16 ENCOUNTER — Telehealth: Payer: Self-pay | Admitting: *Deleted

## 2011-07-16 MED ORDER — FLUTICASONE-SALMETEROL 250-50 MCG/DOSE IN AEPB: 1.0000 | INHALATION_SPRAY | Freq: Two times a day (BID) | RESPIRATORY_TRACT | Status: DC

## 2011-07-16 NOTE — Telephone Encounter (Signed)
Call from pt to ask about her Advair.  Pt was informed that she is not on Advair.  Pt adamantly said that she is and has 2 puffs left and that we needed to get a refill for her.

## 2011-07-17 ENCOUNTER — Telehealth: Payer: Self-pay | Admitting: *Deleted

## 2011-07-17 NOTE — Telephone Encounter (Signed)
Dr Newt Lukes completed papers for prior authorization for Advair Diskus - faxed to [phone]. Hilda Blades Tahmid Stonehocker RN 07/17/11 1:45PM

## 2011-07-17 NOTE — Telephone Encounter (Signed)
Pt was given a sample through Dr. Lynnae January.

## 2011-07-18 ENCOUNTER — Telehealth: Payer: Self-pay | Admitting: *Deleted

## 2011-07-18 NOTE — Telephone Encounter (Signed)
Pt and [facility] aware of approval Advair Diskus till10/23/2014. Hilda Blades Parthena Fergeson RN 07/18/11 1:30PM

## 2011-07-23 ENCOUNTER — Encounter: Payer: Self-pay | Admitting: Internal Medicine

## 2011-07-27 NOTE — Discharge Summary (Signed)
[name], GUNNOE NO.:  [id number]  MEDICAL RECORD NO.:  [id number]  LOCATION:                                 FACILITY:  PHYSICIAN:  Michel Bickers, M.D.    DATE OF BIRTH:  1955-11-22  DATE OF ADMISSION:  07/09/2011 DATE OF DISCHARGE:  07/11/2011                              DISCHARGE SUMMARY   PRIMARY CARE PROVIDER:  Outpatient Clinic.  DISCHARGE DIAGNOSES: 1. Chronic obstructive pulmonary disease exacerbation.  The patient was     on nebulizers at home 4 times a day of albuterol and ipratropium prior     to admission. The patient does not use home oxygen. Her COPD had required     a hospital admission within the past month and a home steroid taper. She     was not on any contoller medications prior to admission. 2. Tobacco cessation.  The patient has a history of smoking 3 packs a day      for over 20 years.  She has not smoked for the last 2 months, but is      struggling with this change as she lives with her daughter who is a heavy smoker. 3. Insomnia.  The patient has insomnia treated with Ambien on an     outpatient basis.  DISCHARGE MEDICATIONS: 1. Tessalon Perles 100 mg capsule by mouth 3 times a day as needed. 2. Advair inhaler 250/50 one puff twice daily. 3. Nicotine patch 21 mg transdermally daily. 4. Prednisone 20 mg by mouth daily.  Instructions, take 2 tablets by     mouth once daily for 2 days and take one tablet by mouth once a day     for 2 days, then stop medication. 5. Ambien 5 mg one tablet by mouth daily at bedtime as needed. 6. Albuterol nebulizer 2 vials 4 times a day as needed. 7. Ipratropium nebulizer one vial inhaled 4 times a day as needed. 8. Albuterol inhaler 90 mcg 2 puffs 4 times daily as needed.  DISPOSITION AND FOLLOWUP:  Appointment with Dr. Rosalia Hammers in [facility], July 29, 2011, at 2:15 p.m.  Please monitor the patient's respiratory status during and after steroid taper.  The patient represented  to [facility] after prior steroid taper if possible that she will not be adequately controlled when she has stopped taking the steroids.  PROCEDURES PERFORMED:  Portable chest x-ray findings, severe emphysematous changes consistent with COPD.  No acute cardiopulmonary abnormalities.  CONSULTATIONS:  No consultations ordered.  BRIEF HISTORY AND PHYSICAL:  Mr. Mary Jenkins is a 55 year old woman with a history of COPD with multiple emergency department visits and a recent hospitalization on June 16, 2011, for COPD exacerbation with past heavy tobacco use, past crack cocaine use and breast cancers, status post mastectomy and chemotherapy in the remote history who presents with increased shortness of breath that began 2 days ago and got progressively worse.  She woke this morning with worsening shortness of breath.  She has had mild shortness of breath for the past 2 days.  She has been having cough, which is nonproductive.  No chest pain, fever, or chills.  No runny nose or sore  throat.  No new leg pain.  No sick contacts.  She has been using her albuterol inhaler and albuterol nebulizer 4-5 times a day for the past 2 days without any improvement. Before her last hospitalization, she was using her nebulizers and inhalers just once a day.  She did complete her course of doxycycline and prednisone prescribed at last admission, but started feeling worse around the time that she stopped the course.  She has a history of smoking 3 packs a day for over 20 years for quitting about 2 months ago. While in the ED, she received 2 mg of magnesium sulfate, 125 mg of Solu- Medrol and was placed on BiPAP.  PHYSICAL EXAMINATION:  VITAL SIGNS:  Temperature 98.2, pulse 86-108, blood pressure 103-118/64-81, respiratory rate 23, oxygen saturation 100% on room air on arrival to ED.  The patient was on BiPAP for increased work of breathing. GENERAL:  No acute distress, able to speak in full sentences. HEAD:   Normocephalic, atraumatic. EYES:  Pupils equal and reactive to light.  Extraocular motions intact. No signs of anemia or jaundice. NOSE:  Mucous membranes moist, noninflamed, nonerythematous. THROAT:  Oropharynx nonerythematous.  No exudate appreciated. NECK:  No deformities, no masses or tenderness.  Supple.  No carotid bruits.  No JVD. LUNGS:  Mildly short of breath, clear to auscultation bilaterally without crackles or wheezes. HEART:  Regular rate and rhythm, tachycardia.  S1 and S2 normal without rubs, murmurs, or gallops. ABDOMEN:  Bowel sounds normoactive, soft, nondistended, nontender.  No masses or organomegaly. EXTREMITIES:  No pretibial edema. NEUROLOGIC:  Alert and oriented x3.  Cranial nerves II through XII are grossly intact.  Motor strength is 5/5 in all 4 extremities.  Sensation intact to light touch.  Cerebellar signs negative. SKIN:  No visible rashes or scars.  LABS UPON ADMISSION:  CBC; white blood cells 5.4, H and H 12.1 and 38.1, platelets 256.  No diff.  BMP; sodium 138, potassium 3.9, chloride 104, bicarb 25, BUN 12, creatinine 0.85 with the glucose of 119, calcium 9.1. LFTs, AST 20, ALT 13, alk phos 74, bilirubin 0.3, protein 7.0, albumin 3.4.  Cardiac enzymes, CK total 91, CK-MB 2.4, troponin less than 0.3.  HOSPITAL COURSE BY PROBLEMS: 1. COPD exacerbation.  Began treatment with albuterol, Atrovent     nebulizer, Rocephin, and Zithromax for community-acquired pneumonia     coverage.  The patient was also initially started on BiPAP in the     emergency room, which was initiated for increased work of     breathing.  She was taken off BiPAP on the same day of admission.     The patient was screened for HIV and also was checked for     legionella and streptococcal antigen.  These were found to be     negative.  The patient was started on IV Solu-Medrol 60 mg.  She     was then switched to oral steroids 60 mg.  Her antibiotics were     stopped that morning  after admission since she did not seem to have     any infectious symptoms and she appeared to be having simply a COPD     exacerbation.  The patient was started on oxygen by nasal cannula.     She does not use oxygen at home.  She did not require oxygen by     nasal cannula and this was stopped.  She did well overnight and     through the day  of discharge without any oxygen.  There was a     concern for needs for home oxygen and the patient was walked with     the pulse oximeter.  She started on oxygen saturation of 94% and     decreased to 91% with walking.  She was asymptomatic at this time     so it was felt that she does not need home oxygen. 2. Tobacco cessation.  The patient has not smoked for 2 months,     although she admits to being around her daughter and getting a     significant amount of second-hand smoke.  She says that the     nicotine patches are helping her and reduced all cravings for     smoking and she feels that she will not restart smoking on return     to home. 3. Insomnia.  The patient was restarted on her home dose of Ambien.     No further changes were made to management of her insomnia.  DISCHARGE LABS AND VITALS:  CBC; white blood cells 8.1, H and H 10.2 and 32.2 with platelets of 215.  No diff.  Temperature 98.2, pulse 82, respirations 18, blood pressure 106/71, oxygen saturation 94% on room air.    ______________________________ Hans Eden, MD   ______________________________ Michel Bickers, M.D.    ST/MEDQ  D:  07/17/2011  T:  07/18/2011  Job:  [id number]  Electronically Signed by Hans Eden MD on 07/26/2011 08:28:33 AM Electronically Signed by Michel Bickers M.D. on 07/27/2011 01:29:08 PM

## 2011-07-29 ENCOUNTER — Ambulatory Visit (INDEPENDENT_AMBULATORY_CARE_PROVIDER_SITE_OTHER): Payer: Medicaid Other | Admitting: Internal Medicine

## 2011-07-29 ENCOUNTER — Encounter: Payer: Self-pay | Admitting: Internal Medicine

## 2011-07-29 VITALS — BP 100/73 | HR 91 | Temp 96.6°F | Ht 65.5 in | Wt 97.8 lb

## 2011-07-29 DIAGNOSIS — D649 Anemia, unspecified: Secondary | ICD-10-CM

## 2011-07-29 DIAGNOSIS — F17200 Nicotine dependence, unspecified, uncomplicated: Secondary | ICD-10-CM

## 2011-07-29 DIAGNOSIS — Z299 Encounter for prophylactic measures, unspecified: Secondary | ICD-10-CM

## 2011-07-29 DIAGNOSIS — J449 Chronic obstructive pulmonary disease, unspecified: Secondary | ICD-10-CM

## 2011-07-29 DIAGNOSIS — K7689 Other specified diseases of liver: Secondary | ICD-10-CM

## 2011-07-29 LAB — IRON AND TIBC
%SAT: 23 % (ref 20–55)
Iron: 61 ug/dL (ref 42–145)
TIBC: 267 ug/dL (ref 250–470)
UIBC: 206 ug/dL (ref 125–400)

## 2011-07-29 LAB — FERRITIN: Ferritin: 126 ng/mL (ref 10–291)

## 2011-07-29 NOTE — Assessment & Plan Note (Signed)
Using nicotine patches  At this point.

## 2011-07-29 NOTE — Assessment & Plan Note (Signed)
Will refer patient for Colonoscopy and Mammogram today.

## 2011-07-29 NOTE — Assessment & Plan Note (Signed)
Patient stable on current regimen. Completed prednisone taper from the hospital 10/18. Will continue Advair and prn albuterol and Atrovent . Counseled again how  important it is not to smoke and she agrees.

## 2011-07-29 NOTE — Progress Notes (Signed)
Subjective:   Patient ID: Mary Jenkins female   DOB: Mar 27, 1956 55 y.o.   MRN: [id number]  HPI: [name] is a 55 y.o. female with PMH significant as outlined below who presented to the clinic for hospital follow up. Patient was admitted on 10/16 for COPD exacerbation. Patient was discharged on Advair and prednisone  Taper. She reports she is doing great. She stopped smoking and moved out of he daughter's house who was smoking on a daily basis.She noted that she feels a lot better.       Past Medical History  Diagnosis Date  . Breast cancer   . History of right mastectomy   . Personal history of chemotherapy   . COPD (chronic obstructive pulmonary disease) 06/24/2011  . Liver cyst 06/24/2011  . Smoking addiction 06/24/2011  . Insomnia 06/24/2011   Current Outpatient Prescriptions  Medication Sig Dispense Refill  . albuterol (ACCUNEB) 1.25 MG/3ML nebulizer solution Take 1 ampule by nebulization every 6 (six) hours as needed.        Marland Kitchen albuterol (PROVENTIL HFA;VENTOLIN HFA) 108 (90 BASE) MCG/ACT inhaler Inhale 2 puffs into the lungs every 4 (four) hours as needed.        . Fluticasone-Salmeterol (ADVAIR DISKUS) 250-50 MCG/DOSE AEPB Inhale 1 puff into the lungs 2 (two) times daily.  1 each  0  . ipratropium (ATROVENT HFA) 17 MCG/ACT inhaler Inhale 2 puffs into the lungs 4 (four) times daily. Prn        . zolpidem (AMBIEN) 5 MG tablet Take 1 tablet (5 mg total) by mouth at bedtime as needed for sleep.  30 tablet  0   Family History  Problem Relation Age of Onset  . Hypertension Sister    History   Social History  . Marital Status: Single    Spouse Name: N/A    Number of Children: N/A  . Years of Education: N/A   Social History Main Topics  . Smoking status: Current Some Day Smoker -- 0.2 packs/day for 41 years    Types: Cigarettes  . Smokeless tobacco: Not on file   Comment: quit x 2 months  . Alcohol Use: No  . Drug Use: No     In the past cocaine abuse. Quit 4  years ago.   Marland Kitchen Sexually Active: Not on file   Other Topics Concern  . Not on file   Social History Narrative  . No narrative on file   Review of Systems: Constitutional: Denies fever, chills, diaphoresis, appetite change and fatigue.  HEENT: Denies  congestion, sore throat, rhinorrhea, sneezing, mouth sores, trouble swallowing, neck pain, neck stiffness and tinnitus.   Respiratory: Denies SOB, cough, chest tightness,  and wheezing.   Cardiovascular: Denies chest pain, palpitations and leg swelling.  Gastrointestinal: Denies nausea, vomiting, abdominal pain, diarrhea, constipation, blood in stool and abdominal distention.  Genitourinary: Denies dysuria, urgency, frequency, hematuria, flank pain and difficulty urinating.  Musculoskeletal: Denies myalgias, back pain, joint swelling, arthralgias and gait problem.  Skin: Denies pallor, rash and wound.  Neurological: Denies dizziness,, syncope, weakness, light-headedness, numbness and headaches.  Hematological: Denies adenopathy. Easy bruising, personal or family bleeding history    Objective:  Physical Exam: Constitutional: Vital signs reviewed.  Patient is a well-developed and well-nourished  in no acute distress and cooperative with exam. Alert and oriented x3.  Mouth: no erythema or exudates, MMM Neck: Supple Cardiovascular: RRR, S1 normal, S2 normal, no MRG, pulses symmetric and intact bilaterally Pulmonary/Chest: CTAB, no wheezes, rales, or  rhonchi Abdominal: Soft. Non-tender, non-distended, bowel sounds are normal, Musculoskeletal: No joint deformities, erythema, or stiffness, ROM full and no nontender  Neurological: A&O x3, no focal motor deficit, sensory intact to light touch bilaterally.  Skin: Warm, dry and intact. No rash, cyanosis, or clubbing.

## 2011-08-15 ENCOUNTER — Encounter (HOSPITAL_COMMUNITY): Payer: Self-pay | Admitting: Emergency Medicine

## 2011-08-15 ENCOUNTER — Observation Stay (HOSPITAL_COMMUNITY)
Admission: EM | Admit: 2011-08-15 | Discharge: 2011-08-17 | Disposition: A | Discharge status: Home / Self Care | Payer: Medicaid Other | Source: Ambulatory Visit | Attending: Internal Medicine | Admitting: Internal Medicine

## 2011-08-15 ENCOUNTER — Emergency Department (HOSPITAL_COMMUNITY): Payer: Medicaid Other

## 2011-08-15 DIAGNOSIS — Z87891 Personal history of nicotine dependence: Secondary | ICD-10-CM | POA: Insufficient documentation

## 2011-08-15 DIAGNOSIS — J449 Chronic obstructive pulmonary disease, unspecified: Secondary | ICD-10-CM

## 2011-08-15 DIAGNOSIS — J441 Chronic obstructive pulmonary disease with (acute) exacerbation: Secondary | ICD-10-CM

## 2011-08-15 DIAGNOSIS — G4700 Insomnia, unspecified: Secondary | ICD-10-CM

## 2011-08-15 DIAGNOSIS — D649 Anemia, unspecified: Secondary | ICD-10-CM | POA: Insufficient documentation

## 2011-08-15 DIAGNOSIS — J40 Bronchitis, not specified as acute or chronic: Secondary | ICD-10-CM

## 2011-08-15 LAB — CBC
MCH: 26.1 pg (ref 26.0–34.0)
MCHC: 32.7 g/dL (ref 30.0–36.0)
Platelets: 246 10*3/uL (ref 150–400)
RBC: 4.91 MIL/uL (ref 3.87–5.11)

## 2011-08-15 LAB — BASIC METABOLIC PANEL
GFR calc Af Amer: >90 mL/min (ref >90)
GFR calc non Af Amer: 81 mL/min — ABNORMAL LOW (ref >90)
Potassium: 4.6 mEq/L (ref 3.5–5.1)
Sodium: 136 mEq/L (ref 135–145)

## 2011-08-15 LAB — DIFFERENTIAL
Basophils Relative: 1 % (ref 0–1)
Eosinophils Absolute: 0 10*3/uL (ref 0.0–0.7)
Neutrophils Relative %: 67 % (ref 43–77)

## 2011-08-15 MED ORDER — ALBUTEROL SULFATE (5 MG/ML) 0.5% IN NEBU
5.0000 mg | INHALATION_SOLUTION | Freq: Once | RESPIRATORY_TRACT | Status: AC
  Administered 2011-08-15: 5 mg via RESPIRATORY_TRACT
  Filled 2011-08-15: qty 0.5

## 2011-08-15 MED ORDER — BENZONATATE 100 MG PO CAPS
100.0000 mg | ORAL_CAPSULE | Freq: Three times a day (TID) | ORAL | Status: DC | PRN
  Administered 2011-08-16: 100 mg via ORAL
  Filled 2011-08-15: qty 1

## 2011-08-15 MED ORDER — ACETAMINOPHEN 650 MG RE SUPP: 650.0000 mg | Freq: Four times a day (QID) | RECTAL | Status: DC | PRN

## 2011-08-15 MED ORDER — ALBUTEROL SULFATE (5 MG/ML) 0.5% IN NEBU
15.0000 mg | INHALATION_SOLUTION | Freq: Once | RESPIRATORY_TRACT | Status: AC
  Administered 2011-08-15: 5 mg via RESPIRATORY_TRACT
  Filled 2011-08-15: qty 1
  Filled 2011-08-15: qty 3

## 2011-08-15 MED ORDER — ZOLPIDEM TARTRATE 5 MG PO TABS: 5.0000 mg | ORAL_TABLET | Freq: Every evening | ORAL | Status: DC | PRN

## 2011-08-15 MED ORDER — IPRATROPIUM BROMIDE 0.02 % IN SOLN
0.5000 mg | Freq: Once | RESPIRATORY_TRACT | Status: AC
  Administered 2011-08-15: 0.5 mg via RESPIRATORY_TRACT
  Filled 2011-08-15: qty 2.5

## 2011-08-15 MED ORDER — FLUTICASONE-SALMETEROL 250-50 MCG/DOSE IN AEPB
1.0000 | INHALATION_SPRAY | Freq: Two times a day (BID) | RESPIRATORY_TRACT | Status: DC
  Administered 2011-08-16 – 2011-08-17 (×2): 1 via RESPIRATORY_TRACT
  Filled 2011-08-15: qty 14

## 2011-08-15 MED ORDER — SODIUM CHLORIDE 0.9 % IV BOLUS (SEPSIS)
500.0000 mL | Freq: Once | INTRAVENOUS | Status: AC
  Administered 2011-08-15: 500 mL via INTRAVENOUS

## 2011-08-15 MED ORDER — ALBUTEROL (5 MG/ML) CONTINUOUS INHALATION SOLN
10.0000 mg/h | INHALATION_SOLUTION | RESPIRATORY_TRACT | Status: DC
  Administered 2011-08-15: 10 mg/h via RESPIRATORY_TRACT

## 2011-08-15 MED ORDER — ALBUTEROL SULFATE (5 MG/ML) 0.5% IN NEBU
2.5000 mg | INHALATION_SOLUTION | RESPIRATORY_TRACT | Status: DC
  Administered 2011-08-16 – 2011-08-17 (×9): 2.5 mg via RESPIRATORY_TRACT
  Filled 2011-08-15 (×9): qty 0.5

## 2011-08-15 MED ORDER — ACETAMINOPHEN 325 MG PO TABS
650.0000 mg | ORAL_TABLET | Freq: Four times a day (QID) | ORAL | Status: DC | PRN
  Filled 2011-08-15: qty 2

## 2011-08-15 MED ORDER — SODIUM CHLORIDE 0.9 % IV SOLN
INTRAVENOUS | Status: AC
  Administered 2011-08-15 (×2): via INTRAVENOUS

## 2011-08-15 MED ORDER — METHYLPREDNISOLONE SODIUM SUCC 125 MG IJ SOLR
60.0000 mg | Freq: Every day | INTRAMUSCULAR | Status: DC
  Administered 2011-08-16 – 2011-08-17 (×2): 60 mg via INTRAVENOUS
  Filled 2011-08-15 (×2): qty 0.96

## 2011-08-15 MED ORDER — ALBUTEROL SULFATE (5 MG/ML) 0.5% IN NEBU
INHALATION_SOLUTION | RESPIRATORY_TRACT | Status: AC
  Administered 2011-08-16: 2.5 mg via RESPIRATORY_TRACT
  Filled 2011-08-15: qty 2

## 2011-08-15 MED ORDER — IPRATROPIUM BROMIDE 0.02 % IN SOLN
0.5000 mg | RESPIRATORY_TRACT | Status: DC
  Administered 2011-08-16 – 2011-08-17 (×9): 0.5 mg via RESPIRATORY_TRACT
  Filled 2011-08-15 (×7): qty 2.5

## 2011-08-15 MED ORDER — ENOXAPARIN SODIUM 40 MG/0.4ML ~~LOC~~ SOLN: 40.0000 mg | SUBCUTANEOUS | Status: DC

## 2011-08-15 MED ORDER — IPRATROPIUM BROMIDE 0.02 % IN SOLN
0.5000 mg | RESPIRATORY_TRACT | Status: DC | PRN
  Filled 2011-08-15: qty 2.5

## 2011-08-15 MED ORDER — ENOXAPARIN SODIUM 30 MG/0.3ML ~~LOC~~ SOLN
30.0000 mg | SUBCUTANEOUS | Status: DC
  Administered 2011-08-15 – 2011-08-16 (×2): 30 mg via SUBCUTANEOUS
  Filled 2011-08-15 (×3): qty 0.3

## 2011-08-15 MED ORDER — METHYLPREDNISOLONE SODIUM SUCC 125 MG IJ SOLR
125.0000 mg | Freq: Once | INTRAMUSCULAR | Status: AC
  Administered 2011-08-15: 125 mg via INTRAVENOUS
  Filled 2011-08-15: qty 2

## 2011-08-15 MED ORDER — ALBUTEROL SULFATE (5 MG/ML) 0.5% IN NEBU: 2.5000 mg | INHALATION_SOLUTION | RESPIRATORY_TRACT | Status: DC | PRN

## 2011-08-15 MED ORDER — MOXIFLOXACIN HCL IN NACL 400 MG/250ML IV SOLN
400.0000 mg | INTRAVENOUS | Status: DC
  Administered 2011-08-15 – 2011-08-16 (×2): 400 mg via INTRAVENOUS
  Filled 2011-08-15 (×3): qty 250

## 2011-08-15 MED ORDER — SODIUM CHLORIDE 0.9 % IV SOLN
INTRAVENOUS | Status: DC
  Administered 2011-08-15: 15:00:00 via INTRAVENOUS

## 2011-08-15 NOTE — ED Notes (Signed)
Attempted to call report to floor.  States can not take report at this time.

## 2011-08-15 NOTE — H&P (Signed)
Hospital Admission Note Date: 08/15/2011  Patient name: Mary Jenkins Medical record number: [id number] Date of birth: 08-25-1956 Age: 55 y.o. Gender: female PCP: Rosalia Hammers, MD  Medical Service: Int Med  Attending physician: Lynnae January    Pager: Resident (R2/R3): [place]     Pager: [phone] Resident (R1): [place]    Pager: [phone]  Chief Complaint: SOB  History of Present Illness: Patient is a 55 year old woman with past medical history of COPD who presents to the emergency room with shortness of breath and cough. Patient says for the past 2 days she's had increased cough, white sputum production, chest tightness, and shortness of breath. She has had no recent infections, no sick contacts, but thinks that some burning inscents may have triggered her exacerbation. She moved into her sister's house roughly 3 weeks go, but says there are no allergens, pets, or other triggers in the house. The patient also describes some increased nebulizer use.  She was previously admitted in September and October of 2012 for a COPD exacerbations. She was placed on BiPAP during her October admission, but has not been intubated. She has a history of smoking 3 packs a day for roughly 50 years, but quit smoking 4-5 months ago. She also moved out of her daughter's house, where she was exposed to additional cigarette smoke.  Patient denies chest pain, palpitations, abdominal pain, dysuria, changes in bowel or bladder habits, abdominal pain.  Past Medical History: Past Medical History  Diagnosis Date  . Breast cancer   . History of right mastectomy   . Personal history of chemotherapy   . COPD (chronic obstructive pulmonary disease) 06/24/2011  . Liver cyst 06/24/2011  . Smoking addiction 06/24/2011  . Insomnia 06/24/2011  . Shortness of breath   . Anemia    Past Surgical History  Procedure Date  . Mastectomy, partial     right   . Tubal ligation     Meds: Current Facility-Administered Medications    Medication Dose Route Frequency Provider Last Rate Last Dose  . 0.9 %  sodium chloride infusion   Intravenous Continuous Richarda Blade, MD 125 mL/hr at 08/15/11 1518    . albuterol (PROVENTIL) (5 MG/ML) 0.5% nebulizer solution 15 mg  15 mg Nebulization Once Richarda Blade, MD   5 mg at 08/15/11 1414  . albuterol (PROVENTIL) (5 MG/ML) 0.5% nebulizer solution 5 mg  5 mg Nebulization Once Richarda Blade, MD   5 mg at 08/15/11 1252  . albuterol (PROVENTIL) (5 MG/ML) 0.5% nebulizer solution           . albuterol (PROVENTIL,VENTOLIN) solution continuous neb  10 mg/hr Nebulization Continuous Richarda Blade, MD 2 mL/hr at 08/15/11 1431 10 mg/hr at 08/15/11 1431  . ipratropium (ATROVENT) nebulizer solution 0.5 mg  0.5 mg Nebulization Once Richarda Blade, MD   0.5 mg at 08/15/11 1252  . methylPREDNISolone sodium succinate (SOLU-MEDROL) 125 MG injection 125 mg  125 mg Intravenous Once Richarda Blade, MD   125 mg at 08/15/11 1306  . sodium chloride 0.9 % bolus 500 mL  500 mL Intravenous Once Richarda Blade, MD   500 mL at 08/15/11 1305   Current Outpatient Prescriptions  Medication Sig Dispense Refill  . albuterol (PROVENTIL HFA;VENTOLIN HFA) 108 (90 BASE) MCG/ACT inhaler Inhale 2 puffs into the lungs every 4 (four) hours as needed.        Marland Kitchen albuterol (PROVENTIL) (2.5 MG/3ML) 0.083% nebulizer solution Take 2.5 mg by nebulization every 6 (six) hours as  needed.        . benzonatate (TESSALON) 100 MG capsule Take 100 mg by mouth 3 (three) times daily as needed. For cough       . Fluticasone-Salmeterol (ADVAIR DISKUS) 250-50 MCG/DOSE AEPB Inhale 1 puff into the lungs 2 (two) times daily.  1 each  0  . ipratropium (ATROVENT) 0.02 % nebulizer solution Take 500 mcg by nebulization 4 (four) times daily as needed. For wheezing.        Marland Kitchen zolpidem (AMBIEN) 5 MG tablet Take 1 tablet (5 mg total) by mouth at bedtime as needed for sleep.  30 tablet  0    Allergies: Aspirin  Family Hx: Family History   Problem Relation Age of Onset  . Hypertension Sister     Social Hx: History   Social History  . Marital Status: Single    Spouse Name: N/A    Number of Children: N/A  . Years of Education: N/A   Occupational History  . Not on file.   Social History Main Topics  . Smoking status: Former Smoker -- 0.2 packs/day for 41 years    Types: Cigarettes    Quit date: 07/24/2011  . Smokeless tobacco: Never Used  . Alcohol Use: No  . Drug Use: No     In the past cocaine abuse. Quit 4 years ago.   Marland Kitchen Sexually Active: No   Other Topics Concern  . Not on file   Social History Narrative  . No narrative on file    Review of Systems: As per history of present illness  Physical Exam: Filed Vitals:   08/15/11 1530 08/15/11 1630 08/15/11 1745 08/15/11 1749  BP: _0   Pulse: 94 82 90   Temp:    98.4 F (36.9 C)  TempSrc:    Oral  Resp: _1 SpO2: 100% 99% 100%    GEN: No apparent distress.  Alert and oriented x 3.  Pleasant, conversant, and cooperative to exam. HEENT: head is autraumatic and normocephalic.  Neck is supple without palpable masses or lymphadenopathy.  No JVD or carotid bruits.  Vision intact.  EOMI.  PERRLA.  Sclerae anicteric.  Conjunctivae without pallor or injection. Mucous membranes are moist.  Oropharynx is without erythema, exudates, or other abnormal lesions. RESP:  Lungs are clear to ascultation bilaterally with very poor air movement.  No wheezes, ronchi, or rubs. CARDIOVASCULAR: regular rate, normal rhythm.  Clear S1, S2, no murmurs, gallops, or rubs. ABDOMEN: soft, non-tender, non-distended.  Bowels sounds present in all quadrants and normoactive.  No palpable masses. EXT: warm and dry.  Peripheral pulses equal, intact, and +2 globally.  No clubbing or cyanosis.  No edema in b/l lower extremeties SKIN: warm and dry with normal turgor.  No rashes or abnormal lesions observed.  Lab results: Basic Metabolic Panel:  Basename 08/15/11 1240   NA 136  K 4.6  CL 100  CO2 26  GLUCOSE 87  BUN 11  CREATININE 0.80  CALCIUM 8.7  MG --  PHOS --   CBC:  Basename 08/15/11 1240  WBC 5.6  NEUTROABS 3.7  HGB 12.8  HCT 39.2  MCV 79.8  PLT 246    Imaging results:  Dg Chest Port 1 View  08/15/2011  *RADIOLOGY REPORT*  Clinical Data: Shortness of breath.  PORTABLE CHEST - 1 VIEW  Comparison: 07/09/2011.  Findings: The patient has a right breast implant.  Again noted is hyperinflation of the lungs which is unchanged.  There is a round nodular density in the left hemithorax probably representing a nipple shadow and similar to prior examinations.  Stable appearance of the heart and mediastinum.  Postsurgical changes near the right axilla.  No focal airspace disease.  IMPRESSION: Emphysematous changes without focal disease.  Original Report Authenticated By: Markus Daft, M.D.    Assessment & Plan by Problem: #Shortness of breath Patient presents with 2 days of shortness of breath, increased sputum, and cough, most likely secondary to COPD exacerbation. This patient does have extensive history of COPD, and this appears to be yet another episode. Given the lack of leukocytosis and negative chest x-ray, pneumonia remains much less likely. PE is also unlikely given her low Geneva score. She was given steroids and nebulizers in the ED, and by the time we evaluated her she was breathing quite comfortably with no wheezes on exam. She has, however, been in the hospital for 3 months, so we will give her IV Avelox. Overall she does appear quite well, and we will evaluate her overnight and consider disposition as she progresses. - Solu-Medrol 60 mg IV - Albuterol nebs q4h - Atrovent nebs q4h - Avelox IV - Advair twice a day - Sputum culture - Incentive spirometry  #Ppx - Lovenox

## 2011-08-15 NOTE — ED Notes (Signed)
Pt state she has been having shortness of breath for the past two days but it got a lot worse this morning.  Pt state she has been coughing and took medications at home for her cough

## 2011-08-15 NOTE — ED Notes (Signed)
Pt reports breathing has improved.

## 2011-08-15 NOTE — ED Notes (Signed)
Pt given happy meal and ice water per RNs approval.  Pt finished albuterol treatment and resting comfortably with no other requests at this time

## 2011-08-15 NOTE — ED Provider Notes (Signed)
History     CSN: [id number] Arrival date & time: 08/15/2011  9:46 AM   First MD Initiated Contact with Patient 08/15/11 [phone]      Chief Complaint  Patient presents with  . Shortness of Breath    (Consider location/radiation/quality/duration/timing/severity/associated sxs/prior treatment) Patient is a 55 y.o. female presenting with shortness of breath. The history is provided by the patient.  Shortness of Breath  The current episode started yesterday. The problem has been gradually worsening. The problem is moderate. The symptoms are relieved by nothing. The symptoms are aggravated by activity. Associated symptoms include chest pain, cough (Productive of white sputum) and shortness of breath. Pertinent negatives include no chest pressure, no orthopnea, no fever, no rhinorrhea and no sore throat. The cough is productive. The cough is relieved by beta-agonist inhalers. The cough is worsened by activity. She was not exposed to toxic fumes. She has not inhaled smoke recently. She has had intermittent steroid use. She has had prior hospitalizations. She has had no prior ICU admissions. She has had no prior intubations. Her past medical history is significant for asthma. She has been behaving normally. Urine output has been normal. There were no sick contacts. Recently, medical care has been given by the PCP.    Past Medical History  Diagnosis Date  . Breast cancer   . History of right mastectomy   . Personal history of chemotherapy   . COPD (chronic obstructive pulmonary disease) 06/24/2011  . Liver cyst 06/24/2011  . Smoking addiction 06/24/2011  . Insomnia 06/24/2011  . Shortness of breath   . Anemia     Past Surgical History  Procedure Date  . Mastectomy, partial     right   . Tubal ligation     Family History  Problem Relation Age of Onset  . Hypertension Sister     History  Substance Use Topics  . Smoking status: Former Smoker -- 0.2 packs/day for 41 years    Types:  Cigarettes    Quit date: 07/24/2011  . Smokeless tobacco: Never Used  . Alcohol Use: No    OB History    Grav Para Term Preterm Abortions TAB SAB Ect Mult Living                  Review of Systems  Constitutional: Negative for fever.  HENT: Negative for sore throat and rhinorrhea.   Respiratory: Positive for cough (Productive of white sputum) and shortness of breath.   Cardiovascular: Positive for chest pain. Negative for orthopnea.  All other systems reviewed and are negative.    Allergies  Aspirin  Home Medications   No current outpatient prescriptions on file.  BP 104/68  Pulse 90  Temp(Src) 97.9 F (36.6 C) (Oral)  Resp 18  Ht _0  (1.651 m)  Wt 100 lb (45.36 kg)  BMI 16.64 kg/m2  SpO2 98%  Physical Exam  Constitutional: She is oriented to person, place, and time. She appears well-developed and well-nourished.  HENT:  Head: Normocephalic and atraumatic.  Eyes: EOM are normal. Pupils are equal, round, and reactive to light. Right eye exhibits no discharge. Left eye exhibits no discharge.  Neck: Normal range of motion. Neck supple.  Cardiovascular: Normal rate and regular rhythm.   Pulmonary/Chest: She is in respiratory distress. She has wheezes. She has no rales. She exhibits no tenderness.  Abdominal: Soft. Bowel sounds are normal.  Musculoskeletal: Normal range of motion.  Neurological: She is alert and oriented to person, place, and time. No  cranial nerve deficit. She exhibits normal muscle tone. Coordination normal.  Skin: Skin is warm and dry.  Psychiatric: She has a normal mood and affect. Her behavior is normal. Judgment and thought content normal.    ED Course  Procedures (including critical care time) Emergency Department treatment: IV fluids. Multiple nebulizers and Solu-Medrol. Reevaluation at 1558. Patient still  tachypneic and uncomfortable. Her blood pressure is mildly low. Labs Reviewed  CBC - Abnormal; Notable for the following:    RDW  15.8 (*)    All other components within normal limits  BASIC METABOLIC PANEL - Abnormal; Notable for the following:    GFR calc non Af Amer 81 (*)    All other components within normal limits  CBC - Abnormal; Notable for the following:    Hemoglobin 10.3 (*)    HCT 32.2 (*)    MCH 25.7 (*)    RDW 16.2 (*)    All other components within normal limits  BASIC METABOLIC PANEL - Abnormal; Notable for the following:    Glucose, Bld 107 (*)    All other components within normal limits  DIFFERENTIAL  CULTURE, SPUTUM-ASSESSMENT  CBC  VITAMIN B12  FOLATE  IRON AND TIBC  FERRITIN  RETICULOCYTES  CULTURE, RESPIRATORY   Dg Chest Port 1 View  08/15/2011  *RADIOLOGY REPORT*  Clinical Data: Shortness of breath.  PORTABLE CHEST - 1 VIEW  Comparison: 07/09/2011.  Findings: The patient has a right breast implant.  Again noted is hyperinflation of the lungs which is unchanged.  There is a round nodular density in the left hemithorax probably representing a nipple shadow and similar to prior examinations.  Stable appearance of the heart and mediastinum.  Postsurgical changes near the right axilla.  No focal airspace disease.  IMPRESSION: Emphysematous changes without focal disease.  Original Report Authenticated By: Markus Daft, M.D.     1. COPD (chronic obstructive pulmonary disease)   2. Bronchitis   3. Insomnia       MDM  COPD exacerbation, unable to improve in the ER with the usual treatments. She'll be admitted for stabilization.        Richarda Blade, MD 08/17/11 [phone]

## 2011-08-16 LAB — BASIC METABOLIC PANEL
Calcium: 8.9 mg/dL (ref 8.4–10.5)
Creatinine, Ser: 0.69 mg/dL (ref 0.50–1.10)
GFR calc non Af Amer: >90 mL/min (ref >90)
Glucose, Bld: 107 mg/dL — ABNORMAL HIGH (ref 70–99)
Potassium: 4.6 mEq/L (ref 3.5–5.1)
Sodium: 138 mEq/L (ref 135–145)

## 2011-08-16 LAB — EXPECTORATED SPUTUM ASSESSMENT W GRAM STAIN, RFLX TO RESP C

## 2011-08-16 LAB — CBC
MCH: 25.7 pg — ABNORMAL LOW (ref 26.0–34.0)
MCHC: 32 g/dL (ref 30.0–36.0)
Platelets: 202 10*3/uL (ref 150–400)
RDW: 16.2 % — ABNORMAL HIGH (ref 11.5–15.5)

## 2011-08-16 NOTE — Progress Notes (Signed)
Subjective: No events overnight.  Still some tightness, SOB.  Objective: Vital signs in last 24 hours: Filed Vitals:   08/15/11 2003 08/15/11 2054 08/16/11 0533 08/16/11 0945  BP:  105/75 96/60   Pulse:  95 91   Temp:  97.9 F (36.6 C) 97.5 F (36.4 C)   TempSrc:  Oral Oral   Resp:  17 18   Height:      Weight:      SpO2: 98% 97% 91% 100%   Weight change:   Intake/Output Summary (Last 24 hours) at 08/16/11 1135 Last data filed at 08/16/11 0900  Gross per 24 hour  Intake    120 ml  Output      0 ml  Net    120 ml   Physical Exam: Physical Exam GEN: NAD.  Alert and oriented x 3.  Pleasant, conversant, and cooperative to exam. RESP:  Poor air movement, no wheezes CARDIOVASCULAR: RRR, S1, S2, no m/r/g ABDOMEN: soft, NT/ND, NABS EXT: warm and dry. No edema in b/l LE  Lab Results: Basic Metabolic Panel:  Lab 62/13/08 0500 08/15/11 1240  NA 138 136  K 4.6 4.6  CL 104 100  CO2 24 26  GLUCOSE 107* 87  BUN 12 11  CREATININE 0.69 0.80  CALCIUM 8.9 8.7  MG -- --  PHOS -- --   CBC:  Lab 08/16/11 0500 08/15/11 1240  WBC 4.4 5.6  NEUTROABS -- 3.7  HGB 10.3* 12.8  HCT 32.2* 39.2  MCV 80.3 79.8  PLT 202 246   CarUrine Drug Screen: Drugs of Abuse     Component Value Date/Time   LABOPIA NONE DETECTED 06/13/2011 2154   COCAINSCRNUR NONE DETECTED 06/13/2011 2154   LABBENZ NONE DETECTED 06/13/2011 2154   AMPHETMU NONE DETECTED 06/13/2011 2154   THCU NONE DETECTED 06/13/2011 2154   LABBARB NONE DETECTED 06/13/2011 2154     Micro Results: No results found for this or any previous visit (from the past 240 hour(s)). Studies/Results: Dg Chest Port 1 View  08/15/2011  *RADIOLOGY REPORT*  Clinical Data: Shortness of breath.  PORTABLE CHEST - 1 VIEW  Comparison: 07/09/2011.  Findings: The patient has a right breast implant.  Again noted is hyperinflation of the lungs which is unchanged.  There is a round nodular density in the left hemithorax probably representing a nipple  shadow and similar to prior examinations.  Stable appearance of the heart and mediastinum.  Postsurgical changes near the right axilla.  No focal airspace disease.  IMPRESSION: Emphysematous changes without focal disease.  Original Report Authenticated By: Markus Daft, M.D.   Medications: I have reviewed the patient's current medications. Scheduled Meds:   . albuterol  15 mg Nebulization Once  . ipratropium  0.5 mg Nebulization Q4H   And  . albuterol  2.5 mg Nebulization Q4H  . albuterol  5 mg Nebulization Once  . enoxaparin (LOVENOX) injection  30 mg Subcutaneous Q24H  . Fluticasone-Salmeterol  1 puff Inhalation BID  . ipratropium  0.5 mg Nebulization Once  . methylPREDNISolone (SOLU-MEDROL) injection  125 mg Intravenous Once  . methylPREDNISolone (SOLU-MEDROL) injection  60 mg Intravenous Daily  . moxifloxacin  400 mg Intravenous Q24H  . sodium chloride  500 mL Intravenous Once  . DISCONTD: enoxaparin  40 mg Subcutaneous Q24H   Continuous Infusions:   . sodium chloride 150 mL/hr at 08/15/11 2122  . DISCONTD: sodium chloride 125 mL/hr at 08/15/11 1518  . DISCONTD: albuterol 10 mg/hr (08/15/11 1431)   PRN Meds:.acetaminophen, acetaminophen,  albuterol, benzonatate, ipratropium, zolpidem Assessment/Plan: #Shortness of breath  Mildly improved overnight, still with SOB and tightness.  Was on RA but now on [place] at 2 L.  Will con't with IV steroids today, likely switch to PO tomorrow. - Solu-Medrol 60 mg IV today, switch to PO tomorrow - Albuterol nebs q4h  - Atrovent nebs q4h  - Avelox IV  - Advair twice a day  - Sputum culture  - Incentive spirometry   #Ppx  - Lovenox   LOS: 1 day   WILDMAN-TOBRINER, BEN 08/16/2011, 11:35 AM

## 2011-08-16 NOTE — Progress Notes (Signed)
08-16-11 UR completed . Magdalen Spatz RN BSN

## 2011-08-16 NOTE — H&P (Signed)
I have reviewed and discussed the care of this patient in detail with the resident physician including pertinent patient records, physical exam findings and data. I agree with details of this encounter with the following changes: Transition to PO steroids, continue to monitor inpatient until lung exam improved. Needs Pulm Rehab referral on D/C and nebs at home. Ambulatory O2Sats-may need home o2,

## 2011-08-17 LAB — RETICULOCYTES
RBC.: 4.32 MIL/uL (ref 3.87–5.11)
Retic Count, Absolute: 64.8 10*3/uL (ref 19.0–186.0)
Retic Ct Pct: 1.5 % (ref 0.4–3.1)

## 2011-08-17 LAB — FERRITIN: Ferritin: 96 ng/mL (ref 10–291)

## 2011-08-17 LAB — CBC
MCH: 25.7 pg — ABNORMAL LOW (ref 26.0–34.0)
MCHC: 31.9 g/dL (ref 30.0–36.0)
MCV: 80.6 fL (ref 78.0–100.0)
Platelets: 223 10*3/uL (ref 150–400)
RBC: 4.32 MIL/uL (ref 3.87–5.11)
RDW: 16.4 % — ABNORMAL HIGH (ref 11.5–15.5)

## 2011-08-17 LAB — VITAMIN B12: Vitamin B-12: 361 pg/mL (ref 211–911)

## 2011-08-17 LAB — IRON AND TIBC: UIBC: 208 ug/dL (ref 125–400)

## 2011-08-17 MED ORDER — MOXIFLOXACIN HCL 400 MG PO TABS: 400.0000 mg | ORAL_TABLET | Freq: Every day | ORAL | Status: DC

## 2011-08-17 MED ORDER — PREDNISONE 20 MG PO TABS: ORAL_TABLET | ORAL | Status: DC

## 2011-08-17 NOTE — Progress Notes (Signed)
Subjective: Feeling better today.  Still coughing a lot but no fevers.  Denies any pain, diarrhea, constipation, nausea, or decreased appetite.    Objective: Vital signs in last 24 hours: Filed Vitals:   08/16/11 2300 08/17/11 0438 08/17/11 0519 08/17/11 0802  BP:   96/60   Pulse:   88   Temp:   98 F (36.7 C)   TempSrc:   Oral   Resp:   18   Height:      Weight:      SpO2: 98% 97% 100% 99%   Weight change:   Intake/Output Summary (Last 24 hours) at 08/17/11 0840 Last data filed at 08/17/11 0600  Gross per 24 hour  Intake   1690 ml  Output      1 ml  Net   1689 ml   Physical Exam: Vitals reviewed. General: resting in bed, NAD HEENT: PERRL, EOMI, no scleral icterus Cardiac: RRR, no rubs, murmurs or gallops Pulm: very distant breath sounds but clear to auscultation bilaterally, no wheezes, rales, or rhonchi Abd: soft, nontender, nondistended, BS present Ext: warm and well perfused, no pedal edema Neuro: alert and oriented X3, cranial nerves II-XII grossly intact, strength and sensation to light touch equal in bilateral upper and lower extremities  Lab Results: Basic Metabolic Panel:  Lab 27/51/70 0500 08/15/11 1240  NA 138 136  K 4.6 4.6  CL 104 100  CO2 24 26  GLUCOSE 107* 87  BUN 12 11  CREATININE 0.69 0.80  CALCIUM 8.9 8.7  MG -- --  PHOS -- --   CBC:  Lab 08/17/11 0600 08/16/11 0500 08/15/11 1240  WBC 6.4 4.4 --  NEUTROABS -- -- 3.7  HGB 11.1* 10.3* --  HCT 34.8* 32.2* --  MCV 80.6 80.3 --  PLT 223 202 --   Anemia Panel:  Lab 08/17/11 0600  VITAMINB12 --  FOLATE --  FERRITIN --  TIBC --  IRON --  RETICCTPCT 1.5   Urine Drug Screen: Drugs of Abuse     Component Value Date/Time   LABOPIA NONE DETECTED 06/13/2011 2154   COCAINSCRNUR NONE DETECTED 06/13/2011 2154   LABBENZ NONE DETECTED 06/13/2011 2154   AMPHETMU NONE DETECTED 06/13/2011 2154   THCU NONE DETECTED 06/13/2011 2154   LABBARB NONE DETECTED 06/13/2011 2154    Micro Results: Recent  Results (from the past 240 hour(s))  CULTURE, SPUTUM-ASSESSMENT     Status: Normal   Collection Time   08/16/11  7:53 PM      Component Value Range Status Comment   Specimen Description SPUTUM   Final    Special Requests NONE   Final    Sputum evaluation     Final    Value: THIS SPECIMEN IS ACCEPTABLE. RESPIRATORY CULTURE REPORT TO FOLLOW.   Report Status 08/16/2011 FINAL   Final   CULTURE, RESPIRATORY     Status: Normal (Preliminary result)   Collection Time   08/16/11  7:53 PM      Component Value Range Status Comment   Specimen Description SPUTUM   Final    Special Requests NONE   Final    Gram Stain     Final    Value: MODERATE WBC PRESENT, PREDOMINANTLY PMN     RARE SQUAMOUS EPITHELIAL CELLS PRESENT     RARE GRAM POSITIVE COCCI IN PAIRS   Culture PENDING   Incomplete    Report Status PENDING   Incomplete    Studies/Results: Dg Chest Port 1 View  08/15/2011  *RADIOLOGY  REPORT*  Clinical Data: Shortness of breath.  PORTABLE CHEST - 1 VIEW  Comparison: 07/09/2011.  Findings: The patient has a right breast implant.  Again noted is hyperinflation of the lungs which is unchanged.  There is a round nodular density in the left hemithorax probably representing a nipple shadow and similar to prior examinations.  Stable appearance of the heart and mediastinum.  Postsurgical changes near the right axilla.  No focal airspace disease.  IMPRESSION: Emphysematous changes without focal disease.  Original Report Authenticated By: Markus Daft, M.D.   Medications: I have reviewed the patient's current medications. Scheduled Meds:   . ipratropium  0.5 mg Nebulization Q4H   And  . albuterol  2.5 mg Nebulization Q4H  . enoxaparin (LOVENOX) injection  30 mg Subcutaneous Q24H  . Fluticasone-Salmeterol  1 puff Inhalation BID  . methylPREDNISolone (SOLU-MEDROL) injection  60 mg Intravenous Daily  . moxifloxacin  400 mg Intravenous Q24H   Continuous Infusions:  PRN Meds:.acetaminophen, acetaminophen,  albuterol, benzonatate, ipratropium, zolpidem  Assessment/Plan: 1. Shortness of breath: Secondary to COPD exacerbation.  Improving slowly.  Off O2 this am with good sats.  Still states she feels short of breath and is coughing.   - Change to PO prednisone 60 today. - Ambulate with O2 monitor today for desaturation.  If these are good will consider D/C this afternoon. - Albuterol nebs q4h  - Atrovent nebs q4h  - Avelox PO - Advair twice a day  - Sputum culture Pending. - continue to encouraged Incentive spirometry   2. Anemia:  Hemoglobin stable at 11.1.  MCV 80.6 which is normal but mildly hypochromic.  Retic % normal.  Rest of the Anemia panel is pending today.  Will continue to follow.  May be contributing to her SOB.  We will consider iron therapy if they appear low.   3. Ppx  - Lovenox   LOS: 2 days   Sheryn Aldaz 08/17/2011, 8:40 AM

## 2011-08-17 NOTE — Progress Notes (Signed)
Gave pt AVS with follow up appointments, new prescriptions and medication administration times. All questions answered. Skin intact. D/C IV, unremarkable. D/C telemetry. Went home with family members. Devoria Albe RN

## 2011-08-17 NOTE — Progress Notes (Signed)
Pt ambulated the length of two hallways without O2 and oxygen sat. Remained at 91% the whole time. Devoria Albe RN

## 2011-08-19 ENCOUNTER — Telehealth: Payer: Self-pay | Admitting: *Deleted

## 2011-08-19 ENCOUNTER — Other Ambulatory Visit: Payer: Self-pay | Admitting: Internal Medicine

## 2011-08-19 LAB — CULTURE, RESPIRATORY W GRAM STAIN

## 2011-08-19 MED ORDER — LEVOFLOXACIN 750 MG PO TABS: 750.0000 mg | ORAL_TABLET | Freq: Every day | ORAL | Status: DC

## 2011-08-19 NOTE — Telephone Encounter (Signed)
Dr. Obie Dredge changed abx to Levaquin and electronic sent to [facility]; pt was called and made awared.

## 2011-08-19 NOTE — Telephone Encounter (Signed)
Pt states she was d/c from the hospital recently.  She was prescribed Moxifloxacin and Prednisone; she was able to purchase the Prednisone but did not get The Moxifloxacin b/c need prior authorization from [facility].  Do you want to try to get it approved or change medication? Thanks

## 2011-08-19 NOTE — Discharge Summary (Signed)
[facility] Discharge Note  Name: Mary Jenkins MRN: [id number] DOB: 11-Jun-1956 55 y.o.  Date of Admission: 08/15/2011  9:46 AM Date of Discharge: 08/19/2011 Attending Physician: Dr. Desiree Hane  Discharge Diagnosis: 1. COPD (chronic obstructive pulmonary disease)  2. Anemia  Discharge Medications: Discharge Medication List as of 08/17/2011  4:50 PM    START taking these medications   Details  moxifloxacin (AVELOX) 400 MG tablet Take 1 tablet (400 mg total) by mouth daily., Starting 08/17/2011, Until Tue 08/27/11, Normal    predniSONE (DELTASONE) 20 MG tablet Take 3 tabs BID for 3 days then 3 tabs daily for 3 days then 2 tabs daily for 3 days then 1 tab daily for 3 days then 1/2 tab daily til gone, Normal      CONTINUE these medications which have NOT CHANGED   Details  albuterol (PROVENTIL HFA;VENTOLIN HFA) 108 (90 BASE) MCG/ACT inhaler Inhale 2 puffs into the lungs every 4 (four) hours as needed.  , Starting 06/16/2011, Until Discontinued, Historical Med    albuterol (PROVENTIL) (2.5 MG/3ML) 0.083% nebulizer solution Take 2.5 mg by nebulization every 6 (six) hours as needed.  , Until Discontinued, Historical Med    benzonatate (TESSALON) 100 MG capsule Take 100 mg by mouth 3 (three) times daily as needed. For cough , Until Discontinued, Historical Med    Fluticasone-Salmeterol (ADVAIR DISKUS) 250-50 MCG/DOSE AEPB Inhale 1 puff into the lungs 2 (two) times daily., Starting 07/16/2011, Until Wed 07/15/12, Sample    ipratropium (ATROVENT) 0.02 % nebulizer solution Take 500 mcg by nebulization 4 (four) times daily as needed. For wheezing.  , Until Discontinued, Historical Med    zolpidem (AMBIEN) 5 MG tablet Take 1 tablet (5 mg total) by mouth at bedtime as needed for sleep., Starting 06/24/2011, Until Tue 06/23/12, Print       Disposition and follow-up:   Mary Jenkins was discharged from [facility] in Stable and improved  condition.  She will have a follow up appointment in the [facility] on 08/22/11 with Dr. Marcello Jenkins.  At that appointment they should assess how she is doing after the steroid taper.    Follow-up Appointments: Follow-up Information    Make an appointment with [facility]. (We will call you with a follow up appointment. )    Contact information:   [address] [place]         Discharge Orders    Future Appointments: Provider: Department: Dept Phone: Center:   08/22/2011 2:30 PM Mary Eden, MD [facility] [phone] [facility]   08/23/2011 11:00 AM [place] [phone] [facility]   08/26/2011 2:45 PM [facility] [phone]   09/06/2011 10:30 AM Mary Jenkins, [place] [phone] [facility]   10/01/2011 3:15 PM Mary Jenkins [facility] [phone] [facility]     Future Orders Please Complete By Expires   Diet - low sodium heart healthy      Increase activity slowly      Discharge instructions      Comments:   1. Start Avelox 400 mg 1 tablet daily for 5 more days. 2. Start Prednisone 20 mg tablets:  Take 3 tablets twice daily for 3 days then 3 tablets daily for 3 days then 2 tablets daily for 3 days then 1 tablet daily for 3 days then 1/2 tablet daily until gone. 3.  We will call with a follow up appointment  at [facility]. 4.  Continue all your other medications as directed.   Call MD for:  temperature >100.4      Call MD for:  difficulty breathing, headache or visual disturbances        Consultations:    Procedures Performed:  Dg Chest Port 1 View  08/15/2011  *RADIOLOGY REPORT*  Clinical Data: Shortness of breath.  PORTABLE CHEST - 1 VIEW  Comparison: 07/09/2011.  Findings: The patient has a right breast implant.  Again noted is hyperinflation of the lungs which is unchanged.  There is a round nodular density in the left hemithorax probably representing a nipple  shadow and similar to prior examinations.  Stable appearance of the heart and mediastinum.  Postsurgical changes near the right axilla.  No focal airspace disease.  IMPRESSION: Emphysematous changes without focal disease.  Original Report Authenticated By: Mary Jenkins, M.D.   Admission HPI: Patient is a 55 year old woman with past medical history of COPD who presents to the emergency room with shortness of breath and cough. Patient says for the past 2 days she's had increased cough, white sputum production, chest tightness, and shortness of breath. She has had no recent infections, no sick contacts, but thinks that some burning inscents may have triggered her exacerbation. She moved into her sister's house roughly 3 weeks go, but says there are no allergens, pets, or other triggers in the house. The patient also describes some increased nebulizer use. She was previously admitted in September and October of 2012 for a COPD exacerbations. She was placed on BiPAP during her October admission, but has not been intubated. She has a history of smoking 3 packs a day for roughly 50 years, but quit smoking 4-5 months ago. She also moved out of her daughter's house, where she was exposed to additional cigarette smoke.  Patient denies chest pain, palpitations, abdominal pain, dysuria, changes in bowel or bladder habits, abdominal pain.  Hospital Course by problem list: 1. COPD:  Mary Jenkins was admitted with a COPD exacerbation.  She was given IV solumedrol in the ED and slowly tapered off steroids.  She was also started on scheduled nebulizers and avelox.  She was slowly tapered off oxygen to her baseline of room air.  On the date of discharge she ambulated without O2 and was able to maintain her O2 saturation >90%.  On discharge she was sent with an additional 5 days of Avelox.  This was changed to Levoquin because of cost issues and Avelox needing a Prior Authorization from [place].  She was also sent on a prednisone  taper over the next 10 days.  2. Anemia:  On admission her hemoglobin was noted to be 10.3.  MCV was 79.8 which indicated microcytosis.  Anemia panel showed iron at 30, Ferritin of 96, % Sat was 13 with reticulocyte % was 1.5 indicating that this a mild Iron deficiency anemia with a component of anemia of chronic disease.  She may respond well to iron therapy which can be initiated as an outpatient.   Discharge Vitals:  BP 98/65  Pulse 90  Temp(Src) 98.1 F (36.7 C) (Oral)  Resp 18  Ht _0  (1.651 m)  Wt 100 lb (45.36 kg)  BMI 16.64 kg/m2  SpO2 94%  Discharge Labs: No results found for this or any previous visit (from the past 24 hour(s)).  Signed: Nannie Starzyk 08/19/2011, 11:57 AM

## 2011-08-22 ENCOUNTER — Encounter: Payer: Self-pay | Admitting: Ophthalmology

## 2011-08-22 ENCOUNTER — Encounter: Payer: Medicaid Other | Admitting: Ophthalmology

## 2011-08-22 ENCOUNTER — Ambulatory Visit (INDEPENDENT_AMBULATORY_CARE_PROVIDER_SITE_OTHER): Payer: Medicaid Other | Admitting: Ophthalmology

## 2011-08-22 VITALS — BP 121/71 | HR 83 | Temp 98.2°F | Ht 65.5 in | Wt 104.4 lb

## 2011-08-22 DIAGNOSIS — G4700 Insomnia, unspecified: Secondary | ICD-10-CM

## 2011-08-22 DIAGNOSIS — J449 Chronic obstructive pulmonary disease, unspecified: Secondary | ICD-10-CM

## 2011-08-22 DIAGNOSIS — R61 Generalized hyperhidrosis: Secondary | ICD-10-CM

## 2011-08-22 DIAGNOSIS — Z299 Encounter for prophylactic measures, unspecified: Secondary | ICD-10-CM

## 2011-08-22 DIAGNOSIS — D649 Anemia, unspecified: Secondary | ICD-10-CM

## 2011-08-22 DIAGNOSIS — Z0000 Encounter for general adult medical examination without abnormal findings: Secondary | ICD-10-CM

## 2011-08-22 LAB — LIPID PANEL
HDL: 98 mg/dL (ref >39)
LDL Cholesterol: 110 mg/dL — ABNORMAL HIGH (ref 0–99)
Total CHOL/HDL Ratio: 2.3 Ratio

## 2011-08-22 NOTE — Assessment & Plan Note (Signed)
Patient is sleeping better likely due to improved respiratory status and breathing txt before bed. She is using [place] only occassionally.

## 2011-08-22 NOTE — Assessment & Plan Note (Signed)
Patient mentioned this at the end of her visit. Has been post-menopausal for over 15 years so this is unlikely to be hot flashes. Checked TSH since not checked in the past. CBC has normal blood counts. Could check PPD at next visit.

## 2011-08-22 NOTE — Patient Instructions (Signed)
Keep walking regularly as long as you feel comfortable. Keep up the good work with staying away from the cigarettes!!

## 2011-08-22 NOTE — Assessment & Plan Note (Signed)
Patient is doing well aside from exacerbation caused by URI. Has not restarted smoking and is avoiding situations that will tempt her. Advised her to continue her current regimen and keep walking to build her capacity back up.

## 2011-08-22 NOTE — Assessment & Plan Note (Signed)
Offer pap smear at next appointment. Patient reports not having one for the past 20 years. She is not sexually active for the past 4-5 years. Post-menopausal.

## 2011-08-22 NOTE — Progress Notes (Signed)
Subjective:   Patient ID: [name] female   DOB: 1956/06/11 55 y.o.   MRN: [id number]  HPI: [name] is a 55 y.o. woman who is a hospital follow up from 3rd COPD exacerbation in 3 months. No hospitalizations for exacerbations prior to these.  COPD: She has moved out living with her daughter Nov 1st who is a heavy smoker and was not willing to quit smoking. She had a cough and nasal congestion and chest congestion. Went to her sister's house for [employer] and was likely exposed to a sick person there. Tried to treat at home with a breathing treatment but she couldn't. Is taking albuterol inhaler and advair twice a day. Is doing treatment every morning and every night. Using puff when she over exerts herself, trying not to use it. Uses maybe 2-3 times a week. Was feeling fine until she got sick. Was able to walk all the way around [facility] which was a large improvement from her prior ability to walk around a room. Oxygen goes down to low 90's when walking.  RHM: has mammogram and colonoscopy scheduled. No abnormal pap smears in the past. Is willing to do pap smear at next appt. Went through menopause at 55 in the the setting of chemotherapy. Will measure lipid panel.  Night sweats: Has night sweats that wet the sheets. Does sleep near a furnace. Was also hot when staying with her daughter who didn't use the heat. No heart racing. No prior TSH. Sister has a goiter.  Insomnia: Sleeping well, not needing to use ambien regularly.  Past Medical History  Diagnosis Date  . Breast cancer   . History of right mastectomy   . Personal history of chemotherapy   . COPD (chronic obstructive pulmonary disease) 06/24/2011  . Liver cyst 06/24/2011  . Smoking addiction 06/24/2011  . Insomnia 06/24/2011  . Shortness of breath   . Anemia    Current Outpatient Prescriptions  Medication Sig Dispense Refill  . albuterol (PROVENTIL HFA;VENTOLIN HFA) 108 (90 BASE) MCG/ACT inhaler Inhale 2 puffs into  the lungs every 4 (four) hours as needed.        Marland Kitchen albuterol (PROVENTIL) (2.5 MG/3ML) 0.083% nebulizer solution Take 2.5 mg by nebulization every 6 (six) hours as needed.        . benzonatate (TESSALON) 100 MG capsule Take 100 mg by mouth 3 (three) times daily as needed. For cough       . Fluticasone-Salmeterol (ADVAIR DISKUS) 250-50 MCG/DOSE AEPB Inhale 1 puff into the lungs 2 (two) times daily.  1 each  0  . ipratropium (ATROVENT) 0.02 % nebulizer solution Take 500 mcg by nebulization 4 (four) times daily as needed. For wheezing.        Marland Kitchen levofloxacin (LEVAQUIN) 750 MG tablet Take 1 tablet (750 mg total) by mouth daily.  5 tablet  0  . predniSONE (DELTASONE) 20 MG tablet Take 3 tabs BID for 3 days then 3 tabs daily for 3 days then 2 tabs daily for 3 days then 1 tab daily for 3 days then 1/2 tab daily til gone  38 tablet  0  . zolpidem (AMBIEN) 5 MG tablet Take 1 tablet (5 mg total) by mouth at bedtime as needed for sleep.  30 tablet  0   Family History  Problem Relation Age of Onset  . Hypertension Sister    History   Social History  . Marital Status: Single    Spouse Name: N/A    Number  of Children: N/A  . Years of Education: N/A   Social History Main Topics  . Smoking status: Former Smoker -- 0.2 packs/day for 41 years    Types: Cigarettes    Quit date: 07/24/2011  . Smokeless tobacco: Never Used  . Alcohol Use: No  . Drug Use: No     In the past cocaine abuse. Quit 4 years ago.   Marland Kitchen Sexually Active: No   Other Topics Concern  . None   Social History Narrative  . None   Objective:  Physical Exam: Filed Vitals:   08/22/11 1521  BP: 121/71  Pulse: 83  Temp: 98.2 F (36.8 C)  TempSrc: Oral  Height: 5' 5.5" (1.664 m)  Weight: 104 lb 6.4 oz (47.356 kg)  SpO2: 96%   General: thin friendly woman sitting in a hospital wheelchair HEENT: PERRL, EOMI, no scleral icterus Cardiac: RRR, no rubs, murmurs or gallops Pulm: clear to auscultation bilaterally, moving reduced  volumes of air, some accessory muscle use Neuro: alert and oriented X3, cranial nerves II-XII grossly intact  Assessment & Plan:

## 2011-08-22 NOTE — Assessment & Plan Note (Signed)
Patient does not have evidence of iron deficiency or anemia of chronic disease. Her Hgb is also improved to 11.1. She has colonoscopy pre-visit tomorrow.

## 2011-08-23 ENCOUNTER — Ambulatory Visit (AMBULATORY_SURGERY_CENTER): Payer: Medicaid Other | Admitting: *Deleted

## 2011-08-23 VITALS — Ht 65.5 in | Wt 104.8 lb

## 2011-08-23 DIAGNOSIS — Z1211 Encounter for screening for malignant neoplasm of colon: Secondary | ICD-10-CM

## 2011-08-23 NOTE — Progress Notes (Signed)
agree with plans and notes

## 2011-08-23 NOTE — Progress Notes (Signed)
Pt is here today for PV.  She has COPD and was discharged from the hospital 6 days ago with respiratory infection.  She says that she still is weak and having some difficulty breathing on exertion.  Pt wants to cancel colonoscopy scheduled for 12/14.  She will call back and reschedule when she is feeling better.  Ulice Dash

## 2011-08-26 ENCOUNTER — Ambulatory Visit (HOSPITAL_COMMUNITY)
Admission: RE | Admit: 2011-08-26 | Discharge: 2011-08-26 | Disposition: A | Discharge status: Home / Self Care | Payer: Medicaid Other | Source: Ambulatory Visit | Attending: Internal Medicine | Admitting: Internal Medicine

## 2011-08-26 DIAGNOSIS — Z299 Encounter for prophylactic measures, unspecified: Secondary | ICD-10-CM

## 2011-08-26 DIAGNOSIS — Z1231 Encounter for screening mammogram for malignant neoplasm of breast: Secondary | ICD-10-CM | POA: Insufficient documentation

## 2011-09-02 ENCOUNTER — Other Ambulatory Visit: Payer: Self-pay | Admitting: *Deleted

## 2011-09-02 DIAGNOSIS — J449 Chronic obstructive pulmonary disease, unspecified: Secondary | ICD-10-CM

## 2011-09-02 MED ORDER — ALBUTEROL SULFATE (2.5 MG/3ML) 0.083% IN NEBU: 2.5000 mg | INHALATION_SOLUTION | Freq: Four times a day (QID) | RESPIRATORY_TRACT | Status: DC | PRN

## 2011-09-02 MED ORDER — IPRATROPIUM BROMIDE 0.02 % IN SOLN: 500.0000 ug | Freq: Four times a day (QID) | RESPIRATORY_TRACT | Status: DC | PRN

## 2011-09-02 MED ORDER — ALBUTEROL SULFATE HFA 108 (90 BASE) MCG/ACT IN AERS: 2.0000 | INHALATION_SPRAY | RESPIRATORY_TRACT | Status: DC | PRN

## 2011-09-02 NOTE — Telephone Encounter (Signed)
Rx called in 

## 2011-09-06 ENCOUNTER — Other Ambulatory Visit: Payer: Medicaid Other | Admitting: Gastroenterology

## 2011-09-10 ENCOUNTER — Other Ambulatory Visit: Payer: Self-pay | Admitting: *Deleted

## 2011-09-10 DIAGNOSIS — R05 Cough: Secondary | ICD-10-CM

## 2011-09-11 MED ORDER — BENZONATATE 100 MG PO CAPS: 100.0000 mg | ORAL_CAPSULE | Freq: Three times a day (TID) | ORAL | Status: DC | PRN

## 2011-10-01 ENCOUNTER — Encounter: Payer: Medicaid Other | Admitting: Internal Medicine

## 2011-10-01 ENCOUNTER — Encounter: Payer: Self-pay | Admitting: Internal Medicine

## 2011-10-01 ENCOUNTER — Ambulatory Visit (INDEPENDENT_AMBULATORY_CARE_PROVIDER_SITE_OTHER): Payer: Medicaid Other | Admitting: Internal Medicine

## 2011-10-01 DIAGNOSIS — E038 Other specified hypothyroidism: Secondary | ICD-10-CM

## 2011-10-01 DIAGNOSIS — Z299 Encounter for prophylactic measures, unspecified: Secondary | ICD-10-CM

## 2011-10-01 DIAGNOSIS — D649 Anemia, unspecified: Secondary | ICD-10-CM

## 2011-10-01 DIAGNOSIS — K7689 Other specified diseases of liver: Secondary | ICD-10-CM

## 2011-10-01 DIAGNOSIS — R05 Cough: Secondary | ICD-10-CM

## 2011-10-01 DIAGNOSIS — J449 Chronic obstructive pulmonary disease, unspecified: Secondary | ICD-10-CM

## 2011-10-01 DIAGNOSIS — R059 Cough, unspecified: Secondary | ICD-10-CM

## 2011-10-01 MED ORDER — DOXYCYCLINE HYCLATE 100 MG PO TABS: 100.0000 mg | ORAL_TABLET | Freq: Two times a day (BID) | ORAL | Status: AC

## 2011-10-01 MED ORDER — BENZONATATE 100 MG PO CAPS: 100.0000 mg | ORAL_CAPSULE | Freq: Three times a day (TID) | ORAL | Status: DC | PRN

## 2011-10-01 MED ORDER — PREDNISONE (PAK) 10 MG PO TABS: 20.0000 mg | ORAL_TABLET | Freq: Every day | ORAL | Status: AC

## 2011-10-01 MED ORDER — FERROUS SULFATE 325 (65 FE) MG PO TABS: 325.0000 mg | ORAL_TABLET | Freq: Every day | ORAL | Status: DC

## 2011-10-01 MED ORDER — PREDNISONE (PAK) 10 MG PO TABS: 20.0000 mg | ORAL_TABLET | Freq: Every day | ORAL | Status: DC

## 2011-10-01 NOTE — Assessment & Plan Note (Signed)
Most likely acute bronchitis with mild COPD exacerbation. Since patient has recon hospitalization with COPD exacerbation I will start patient on antibiotic therapy with doxycycline for 10 days and 14 days prednisone taper. Recommended to use albuterol and Atrovent inhaler every 4 hours for the next 2 days and then as needed. If she is experiencing worsening symptoms she should call the clinic for further evaluation and management.

## 2011-10-01 NOTE — Progress Notes (Signed)
  Subjective:   Patient ID: Mary Jenkins female   DOB: July 30, 1956 56 y.o.   MRN: [id number]  HPI: [name] is a 56 y.o.  female with past history significant as outlined below who presented to the clinic for cough. Patient reports having productive cough with yellowish sputum since 5 days and has been recently getting worse. She further reports shortness of breath and wheezing and some nasal congestion. She noted she was taken care off her nieces children who are 68 years old and a newborn who had some rest if symptoms. She has been using hot. Inhaler more often.  She is not smoking anymore.  Cough Colonoscopy Liver lesion tsh    Past Medical History  Diagnosis Date  . Breast cancer   . History of right mastectomy   . Personal history of chemotherapy   . COPD (chronic obstructive pulmonary disease) 06/24/2011  . Liver cyst 06/24/2011  . Smoking addiction 06/24/2011  . Insomnia 06/24/2011  . Shortness of breath   . Anemia    Current Outpatient Prescriptions  Medication Sig Dispense Refill  . albuterol (PROVENTIL HFA;VENTOLIN HFA) 108 (90 BASE) MCG/ACT inhaler Inhale 2 puffs into the lungs every 4 (four) hours as needed.  1 Inhaler  2  . albuterol (PROVENTIL) (2.5 MG/3ML) 0.083% nebulizer solution Take 3 mLs (2.5 mg total) by nebulization every 6 (six) hours as needed.  75 mL  3  . benzonatate (TESSALON) 100 MG capsule Take 1 capsule (100 mg total) by mouth 3 (three) times daily as needed. For cough  20 capsule  1  . Fluticasone-Salmeterol (ADVAIR DISKUS) 250-50 MCG/DOSE AEPB Inhale 1 puff into the lungs 2 (two) times daily.  1 each  0  . ipratropium (ATROVENT) 0.02 % nebulizer solution Take 2.5 mLs (500 mcg total) by nebulization 4 (four) times daily as needed. For wheezing.   75 mL  3  . zolpidem (AMBIEN) 5 MG tablet Take 1 tablet (5 mg total) by mouth at bedtime as needed for sleep.  30 tablet  0   Review of Systems: Constitutional: Denies fever, chills, diaphoresis,  appetite change and fatigue.    Respiratory: Noted SOB, DOE, cough, chest tightness,  and wheezing.   Cardiovascular: Denies chest pain, palpitations and leg swelling.  Gastrointestinal: Denies nausea, vomiting, abdominal pain, diarrhea, constipation, blood in stool and abdominal distention.  Genitourinary: Denies dysuria, urgency, frequency, hematuria, flank pain and difficulty urinating.   Skin: Denies pallor, rash and wound. .    Objective:  Physical Exam: Filed Vitals:   10/01/11 1531  BP: 115/74  Pulse: 86  Temp: 97 F (36.1 C)  TempSrc: Oral  Height: 5' 5.5" (1.664 m)  Weight: 106 lb 11.2 oz (48.399 kg)  SpO2: 95%   Constitutional: Vital signs reviewed.  Patient is a well-developed and well-nourished woman in no acute distress and cooperative with exam. Alert and oriented x3.  Ear: TM normal bilaterally Mouth: no erythema or exudates, MMM Neck: Supple,  Cardiovascular: RRR, S1 normal, S2 normal, no MRG, pulses symmetric and intact bilaterally Pulmonary/Chest: Few wheezes throughout, rales, or rhonchi Abdominal: Soft. Non-tender, non-distended, bowel sounds are normal,  Neurological: A&O x3, no focal motor deficit, sensory intact to light touch bilaterally.  Skin: Warm, dry and intact. No rash, cyanosis, or clubbing.

## 2011-10-01 NOTE — Assessment & Plan Note (Signed)
We'll obtain CT of the abdomen and pelvis to reevaluate the liver lesions.

## 2011-10-01 NOTE — Assessment & Plan Note (Signed)
Patient was not able to be referred to colonoscopy in November since patient was in the hospital therefore we schedule a today.

## 2011-10-02 ENCOUNTER — Encounter: Payer: Self-pay | Admitting: Gastroenterology

## 2011-10-02 LAB — TSH: TSH: 0.88 u[IU]/mL (ref 0.350–4.500)

## 2011-10-23 ENCOUNTER — Ambulatory Visit (AMBULATORY_SURGERY_CENTER): Payer: Medicaid Other | Admitting: *Deleted

## 2011-10-23 VITALS — Ht 65.0 in | Wt 107.9 lb

## 2011-10-23 DIAGNOSIS — Z1211 Encounter for screening for malignant neoplasm of colon: Secondary | ICD-10-CM

## 2011-10-23 MED ORDER — MOVIPREP 100 G PO SOLR: ORAL | Status: DC

## 2011-10-30 ENCOUNTER — Encounter: Payer: Self-pay | Admitting: Gastroenterology

## 2011-10-30 ENCOUNTER — Ambulatory Visit (AMBULATORY_SURGERY_CENTER): Payer: Medicaid Other | Admitting: Gastroenterology

## 2011-10-30 VITALS — BP 141/70 | HR 99 | Temp 96.4°F | Resp 24 | Ht 65.0 in | Wt 107.0 lb

## 2011-10-30 DIAGNOSIS — Z1211 Encounter for screening for malignant neoplasm of colon: Secondary | ICD-10-CM

## 2011-10-30 MED ORDER — SODIUM CHLORIDE 0.9 % IV SOLN: 500.0000 mL | INTRAVENOUS | Status: DC

## 2011-10-30 NOTE — Op Note (Signed)
[facility] [employer]. [place], [place]  [id number]  COLONOSCOPY PROCEDURE REPORT  PATIENT:  Mary Jenkins, Mary Jenkins  MR#:  [id number] BIRTHDATE:  1956/09/21, 6 yrs. old  GENDER:  female ENDOSCOPIST:  Milus Banister, MD REFERRING:  Rosalia Hammers  MD PROCEDURE DATE:  10/30/2011 PROCEDURE:  Colonoscopy [phone] ASA CLASS:  Class II INDICATIONS:  Routine Risk Screening MEDICATIONS:   Fentanyl 100 mcg IV, These medications were titrated to patient response per physician's verbal order, Versed 10 mg IV  DESCRIPTION OF PROCEDURE:   After the risks benefits and alternatives of the procedure were thoroughly explained, informed consent was obtained.  Digital rectal exam was performed and revealed no rectal masses.   The LB [id number]-[id number] endoscope was introduced through the anus and advanced to the cecum, which was identified by both the appendix and ileocecal valve, without limitations.  The quality of the prep was excellent..  The instrument was then slowly withdrawn as the colon was fully examined.<<PROCEDUREIMAGES>> FINDINGS:  A normal appearing cecum, ileocecal valve, and appendiceal orifice were identified. The ascending, hepatic flexure, transverse, splenic flexure, descending, sigmoid colon, and rectum appeared unremarkable (see image1, image2, and image3). Retroflexed views in the rectum revealed no abnormalities.  ENDOSCOPIC IMPRESSION: 1) Normal colon 2) No polyps or cancers  RECOMMENDATIONS: 1) You should continue to follow colorectal cancer screening guidelines for "routine risk" patients with a repeat colonoscopy in 10 years. There is no need for FOBT (stool) testing for at least 5 years.  REPEAT EXAM:  10 years  ______________________________ Milus Banister, MD  n. eSIGNED:   Milus Banister at 10/30/2011 10:14 AM  Desiree Lucy, [phone]

## 2011-10-30 NOTE — Progress Notes (Signed)
Pt extremely sleepy from IV sedation. Pt unable to help dress self, required maximum assistance to dress and ambulate to wheelchair. Pt holding in conference room, resting in recliner, care partner at side. Will continue to monitor pt until she is more awake and able to ambulate to wheelchair to go home.  1120-assisted pt minimally to wheelchair to be discharged home. Pt tolerated well. Pt more alert.

## 2011-10-30 NOTE — Progress Notes (Signed)
Patient did not experience any of the following events: a burn prior to discharge; a fall within the facility; wrong site/side/patient/procedure/implant event; or a hospital transfer or hospital admission upon discharge from the facility. (G8907) Patient did not have preoperative order for IV antibiotic SSI prophylaxis. (G8918)  

## 2011-10-30 NOTE — Patient Instructions (Signed)
Please refer to your blue and neon green sheets for instructions regarding diet and activity for the rest of today.  You may resume your medications as you would normally take them.

## 2011-10-31 ENCOUNTER — Telehealth: Payer: Self-pay | Admitting: *Deleted

## 2011-10-31 NOTE — Telephone Encounter (Signed)
  Follow up Call-     Patient questions:  Do you have a fever, pain , or abdominal swelling? no Pain Score  0 *  Have you tolerated food without any problems? yes  Have you been able to return to your normal activities? yes  Do you have any questions about your discharge instructions: Diet   no Medications  no Follow up visit  no  Do you have questions or concerns about your Care? no  Actions: * If pain score is 4 or above: No action needed, pain <4. No ph # recorded in adm. yesterday ;therefore called # on demographic sheet and pt. answered.

## 2011-11-05 ENCOUNTER — Encounter: Payer: Self-pay | Admitting: Internal Medicine

## 2011-11-05 ENCOUNTER — Ambulatory Visit (INDEPENDENT_AMBULATORY_CARE_PROVIDER_SITE_OTHER): Payer: Medicaid Other | Admitting: Internal Medicine

## 2011-11-05 DIAGNOSIS — R05 Cough: Secondary | ICD-10-CM

## 2011-11-05 DIAGNOSIS — K7689 Other specified diseases of liver: Secondary | ICD-10-CM

## 2011-11-05 DIAGNOSIS — R51 Headache: Secondary | ICD-10-CM

## 2011-11-05 DIAGNOSIS — F4541 Pain disorder exclusively related to psychological factors: Secondary | ICD-10-CM

## 2011-11-05 DIAGNOSIS — J449 Chronic obstructive pulmonary disease, unspecified: Secondary | ICD-10-CM

## 2011-11-05 MED ORDER — IPRATROPIUM BROMIDE 0.02 % IN SOLN: 500.0000 ug | Freq: Four times a day (QID) | RESPIRATORY_TRACT | Status: DC | PRN

## 2011-11-05 MED ORDER — BENZONATATE 100 MG PO CAPS: 100.0000 mg | ORAL_CAPSULE | Freq: Three times a day (TID) | ORAL | Status: DC | PRN

## 2011-11-05 MED ORDER — OMEPRAZOLE 20 MG PO CPDR: 20.0000 mg | DELAYED_RELEASE_CAPSULE | Freq: Every day | ORAL | Status: DC

## 2011-11-05 MED ORDER — ALBUTEROL SULFATE (2.5 MG/3ML) 0.083% IN NEBU: 2.5000 mg | INHALATION_SOLUTION | Freq: Four times a day (QID) | RESPIRATORY_TRACT | Status: DC | PRN

## 2011-11-05 NOTE — Progress Notes (Signed)
  Subjective:    Patient ID: Mary Jenkins, female    DOB: 11/17/1955, 56 y.o.   MRN: [id number]  HPI: 56 year old woman with past medical history significant for breast cancer status post surgery, hypertension comes to the clinic for a followup visit.  1) Cough: She reports that she continues to have dry cough for 6-7 months. She states that her cough gets worse when she lies down and she feels ticklish in her throat she also states that her cough gets worse sometimes when her sister is looking some specific type of meals.  2) Headaches: She also reports some headaches for last few months which occur almost every day. She describes these as episodic, lasting for a few seconds, located in the left temporal and occipital region. She states that the she has been undergoing some stress lately from family related issues    Review of Systems  Constitutional: Negative for fever and fatigue.  HENT: Negative for hearing loss, congestion, rhinorrhea and ear discharge.   Respiratory: Positive for cough.   Cardiovascular: Negative for chest pain, palpitations and leg swelling.  Genitourinary: Negative for dysuria and frequency.  Musculoskeletal: Negative for arthralgias.  Neurological: Negative for dizziness, facial asymmetry and headaches.  Hematological: Negative for adenopathy.       Objective:   Physical Exam  Constitutional: She is oriented to person, place, and time. She appears well-developed and well-nourished. No distress.  HENT:  Head: Normocephalic and atraumatic.  Mouth/Throat: No oropharyngeal exudate.  Eyes: Conjunctivae and EOM are normal. Pupils are equal, round, and reactive to light.  Neck: Normal range of motion. Neck supple. No JVD present. No tracheal deviation present. No thyromegaly present.  Cardiovascular: Normal rate, regular rhythm, normal heart sounds and intact distal pulses.  Exam reveals no gallop and no friction rub.   No murmur heard. Pulmonary/Chest: Breath  sounds normal. No stridor. No respiratory distress. She has no wheezes. She has no rales.  Abdominal: Soft. Bowel sounds are normal. She exhibits no distension. There is no tenderness. There is no rebound and no guarding.  Musculoskeletal: Normal range of motion. She exhibits no edema and no tenderness.  Neurological: She is alert and oriented to person, place, and time. She has normal reflexes. She displays normal reflexes. No cranial nerve deficit. Coordination normal.  Skin: Skin is warm. She is not diaphoretic.          Assessment & Plan:

## 2011-11-05 NOTE — Patient Instructions (Signed)
Please schedule a follow up appointment in 1-2 months whenever she is available. . Please bring your medication bottles with your next appointment. Please take your medicines as prescribed.

## 2011-11-06 NOTE — Assessment & Plan Note (Signed)
CT ordered during the last visit did not get approved by Medicaid as apparently there was no indication to cover her pelvis as the part of imaging. I discussed her CT with the radiologist who thinks that this liver lesion is likely a benign cyst but given her prior history of breast cancer, a followup CT is recommended atleast once. I would change the order to CT abdomen with contrast and see if it gets approved by her insurance.

## 2011-11-06 NOTE — Assessment & Plan Note (Signed)
I think her cough could be related to GERD, given the history that it gets worse when she lies down. - Give her a trial of PPI.

## 2011-11-07 DIAGNOSIS — R51 Headache: Secondary | ICD-10-CM | POA: Insufficient documentation

## 2011-11-07 DIAGNOSIS — R519 Headache, unspecified: Secondary | ICD-10-CM | POA: Insufficient documentation

## 2011-11-07 NOTE — Assessment & Plan Note (Signed)
Etiology unclear. I think this is most likely tension or stress headache. - Continue to monitor for now.

## 2011-11-15 ENCOUNTER — Ambulatory Visit (HOSPITAL_COMMUNITY)
Admission: RE | Admit: 2011-11-15 | Discharge: 2011-11-15 | Disposition: A | Discharge status: Home / Self Care | Payer: Medicaid Other | Source: Ambulatory Visit | Attending: Internal Medicine | Admitting: Internal Medicine

## 2011-11-15 ENCOUNTER — Other Ambulatory Visit: Payer: Self-pay | Admitting: Internal Medicine

## 2011-11-15 ENCOUNTER — Other Ambulatory Visit (HOSPITAL_COMMUNITY): Payer: Medicaid Other

## 2011-11-15 DIAGNOSIS — K7689 Other specified diseases of liver: Secondary | ICD-10-CM | POA: Insufficient documentation

## 2011-11-15 DIAGNOSIS — Z853 Personal history of malignant neoplasm of breast: Secondary | ICD-10-CM | POA: Insufficient documentation

## 2011-11-15 DIAGNOSIS — J438 Other emphysema: Secondary | ICD-10-CM | POA: Insufficient documentation

## 2011-11-15 MED ORDER — IOHEXOL 300 MG/ML  SOLN
100.0000 mL | Freq: Once | INTRAMUSCULAR | Status: AC | PRN
  Administered 2011-11-15: 100 mL via INTRAVENOUS

## 2011-12-03 ENCOUNTER — Other Ambulatory Visit: Payer: Self-pay | Admitting: *Deleted

## 2011-12-03 MED ORDER — ALBUTEROL SULFATE HFA 108 (90 BASE) MCG/ACT IN AERS: 2.0000 | INHALATION_SPRAY | RESPIRATORY_TRACT | Status: DC | PRN

## 2011-12-13 ENCOUNTER — Telehealth: Payer: Self-pay | Admitting: *Deleted

## 2011-12-13 NOTE — Telephone Encounter (Signed)
Pt states she has not felt well for 3-5 days, achy, cough, general malaise, denies fevers. No appts this pm, appt made by sharonb. Informed she may go to urg care or ED if becomes worse or feels the need before the appt.

## 2011-12-15 ENCOUNTER — Other Ambulatory Visit: Payer: Self-pay

## 2011-12-15 ENCOUNTER — Observation Stay (HOSPITAL_COMMUNITY)
Admission: EM | Admit: 2011-12-15 | Discharge: 2011-12-16 | Disposition: A | Discharge status: Home / Self Care | Payer: Medicaid Other | Source: Ambulatory Visit | Attending: Internal Medicine | Admitting: Internal Medicine

## 2011-12-15 ENCOUNTER — Emergency Department (HOSPITAL_COMMUNITY): Payer: Medicaid Other

## 2011-12-15 ENCOUNTER — Encounter (HOSPITAL_COMMUNITY): Payer: Self-pay | Admitting: Emergency Medicine

## 2011-12-15 DIAGNOSIS — R51 Headache: Secondary | ICD-10-CM | POA: Insufficient documentation

## 2011-12-15 DIAGNOSIS — Z853 Personal history of malignant neoplasm of breast: Secondary | ICD-10-CM | POA: Insufficient documentation

## 2011-12-15 DIAGNOSIS — J441 Chronic obstructive pulmonary disease with (acute) exacerbation: Principal | ICD-10-CM | POA: Insufficient documentation

## 2011-12-15 DIAGNOSIS — J449 Chronic obstructive pulmonary disease, unspecified: Secondary | ICD-10-CM

## 2011-12-15 DIAGNOSIS — R05 Cough: Secondary | ICD-10-CM

## 2011-12-15 DIAGNOSIS — R0902 Hypoxemia: Secondary | ICD-10-CM

## 2011-12-15 DIAGNOSIS — G4700 Insomnia, unspecified: Secondary | ICD-10-CM

## 2011-12-15 LAB — CBC
HCT: 41.9 % (ref 36.0–46.0)
Hemoglobin: 13.9 g/dL (ref 12.0–15.0)
MCH: 26.1 pg (ref 26.0–34.0)
MCHC: 33.2 g/dL (ref 30.0–36.0)
MCV: 78.6 fL (ref 78.0–100.0)
RDW: 15.1 % (ref 11.5–15.5)

## 2011-12-15 LAB — BASIC METABOLIC PANEL
BUN: 10 mg/dL (ref 6–23)
CO2: 26 mEq/L (ref 19–32)
Chloride: 103 mEq/L (ref 96–112)
Creatinine, Ser: 0.85 mg/dL (ref 0.50–1.10)
Glucose, Bld: 146 mg/dL — ABNORMAL HIGH (ref 70–99)
Potassium: 4.2 mEq/L (ref 3.5–5.1)

## 2011-12-15 LAB — DIFFERENTIAL
Basophils Absolute: 0 10*3/uL (ref 0.0–0.1)
Basophils Relative: 0 % (ref 0–1)
Eosinophils Relative: 1 % (ref 0–5)
Monocytes Absolute: 0.6 10*3/uL (ref 0.1–1.0)
Monocytes Relative: 15 % — ABNORMAL HIGH (ref 3–12)

## 2011-12-15 MED ORDER — SODIUM CHLORIDE 0.9 % IV SOLN
INTRAVENOUS | Status: DC
  Administered 2011-12-15 – 2011-12-16 (×3): via INTRAVENOUS

## 2011-12-15 MED ORDER — ENOXAPARIN SODIUM 40 MG/0.4ML ~~LOC~~ SOLN
40.0000 mg | SUBCUTANEOUS | Status: DC
  Administered 2011-12-16: 40 mg via SUBCUTANEOUS
  Filled 2011-12-15: qty 0.4

## 2011-12-15 MED ORDER — METHYLPREDNISOLONE SODIUM SUCC 125 MG IJ SOLR
125.0000 mg | Freq: Once | INTRAMUSCULAR | Status: AC
  Administered 2011-12-15: 125 mg via INTRAVENOUS
  Filled 2011-12-15: qty 2

## 2011-12-15 MED ORDER — DOXYCYCLINE HYCLATE 100 MG PO TABS
100.0000 mg | ORAL_TABLET | Freq: Two times a day (BID) | ORAL | Status: DC
  Administered 2011-12-15: 100 mg via ORAL
  Filled 2011-12-15 (×3): qty 1

## 2011-12-15 MED ORDER — ZOLPIDEM TARTRATE 5 MG PO TABS
5.0000 mg | ORAL_TABLET | Freq: Every evening | ORAL | Status: DC | PRN
Start: 2011-12-15 — End: 2011-12-16

## 2011-12-15 MED ORDER — ACETAMINOPHEN 325 MG PO TABS: 650.0000 mg | ORAL_TABLET | Freq: Four times a day (QID) | ORAL | Status: DC | PRN

## 2011-12-15 MED ORDER — ALBUTEROL SULFATE (5 MG/ML) 0.5% IN NEBU
2.5000 mg | INHALATION_SOLUTION | Freq: Four times a day (QID) | RESPIRATORY_TRACT | Status: DC
  Administered 2011-12-15 – 2011-12-16 (×4): 2.5 mg via RESPIRATORY_TRACT
  Filled 2011-12-15 (×4): qty 0.5

## 2011-12-15 MED ORDER — PANTOPRAZOLE SODIUM 40 MG PO TBEC
40.0000 mg | DELAYED_RELEASE_TABLET | Freq: Every day | ORAL | Status: DC
  Administered 2011-12-16: 40 mg via ORAL
  Filled 2011-12-15: qty 1

## 2011-12-15 MED ORDER — FLUTICASONE-SALMETEROL 250-50 MCG/DOSE IN AEPB
1.0000 | INHALATION_SPRAY | Freq: Two times a day (BID) | RESPIRATORY_TRACT | Status: DC
  Filled 2011-12-15: qty 14

## 2011-12-15 MED ORDER — IPRATROPIUM BROMIDE 0.02 % IN SOLN
500.0000 ug | Freq: Four times a day (QID) | RESPIRATORY_TRACT | Status: DC
  Administered 2011-12-15 – 2011-12-16 (×4): 500 ug via RESPIRATORY_TRACT
  Filled 2011-12-15 (×4): qty 2.5

## 2011-12-15 MED ORDER — SODIUM CHLORIDE 0.9 % IV BOLUS (SEPSIS)
1000.0000 mL | Freq: Once | INTRAVENOUS | Status: AC
  Administered 2011-12-15: 1000 mL via INTRAVENOUS

## 2011-12-15 MED ORDER — ALBUTEROL SULFATE (5 MG/ML) 0.5% IN NEBU
10.0000 mg | INHALATION_SOLUTION | Freq: Once | RESPIRATORY_TRACT | Status: AC
  Administered 2011-12-15: 10 mg via RESPIRATORY_TRACT
  Filled 2011-12-15: qty 2

## 2011-12-15 MED ORDER — ACETAMINOPHEN 650 MG RE SUPP: 650.0000 mg | Freq: Four times a day (QID) | RECTAL | Status: DC | PRN

## 2011-12-15 MED ORDER — IPRATROPIUM BROMIDE 0.02 % IN SOLN
0.5000 mg | Freq: Once | RESPIRATORY_TRACT | Status: AC
  Administered 2011-12-15: 0.5 mg via RESPIRATORY_TRACT
  Filled 2011-12-15: qty 2.5

## 2011-12-15 MED ORDER — HYDROCOD POLST-CHLORPHEN POLST 10-8 MG/5ML PO LQCR
5.0000 mL | Freq: Two times a day (BID) | ORAL | Status: DC
  Administered 2011-12-15 – 2011-12-16 (×2): 5 mL via ORAL
  Filled 2011-12-15 (×2): qty 5

## 2011-12-15 MED ORDER — PREDNISONE 50 MG PO TABS
60.0000 mg | ORAL_TABLET | Freq: Every day | ORAL | Status: DC
  Administered 2011-12-16: 60 mg via ORAL
  Filled 2011-12-15 (×2): qty 1

## 2011-12-15 NOTE — ED Provider Notes (Signed)
History     CSN: [id number]  Arrival date & time 12/15/11  1658   First MD Initiated Contact with Patient 12/15/11 1708      Chief Complaint  Patient presents with  . Shortness of Breath   PCP: OPC (Consider location/radiation/quality/duration/timing/severity/associated sxs/prior treatment) HPI This 56 year old female is a patient from the outpatient clinic [facility] at [facility], she has a history of COPD and now has been coughing and short of breath for the last few days with gradually worsening shortness breath today. She has chest pain when she coughs and twists and moves her torso but not exertional chest pain. She is no fever or altered mental status. She is in moderately severe respiratory distress able to speak short phrases only. She is mildly hypoxic with room pulse oximetry 90%. She is no nausea vomiting abdominal pain or confusion. She does feel generally weak. She has had no relief with multiple uses of her inhalers at home. Past Medical History  Diagnosis Date  . Breast cancer   . History of right mastectomy   . Personal history of chemotherapy   . COPD (chronic obstructive pulmonary disease) 06/24/2011  . Liver cyst 06/24/2011  . Smoking addiction 06/24/2011  . Insomnia 06/24/2011  . Shortness of breath   . Anemia     Past Surgical History  Procedure Date  . Mastectomy, partial 1997    right   . Tubal ligation     Family History  Problem Relation Age of Onset  . Hypertension Sister   . Colon cancer Neg Hx   . Esophageal cancer Neg Hx   . Stomach cancer Neg Hx   . Heart disease Brother   . Heart disease Brother   . Heart disease Brother     History  Substance Use Topics  . Smoking status: Former Smoker -- 0.2 packs/day for 41 years    Types: Cigarettes    Quit date: 07/24/2011  . Smokeless tobacco: Never Used  . Alcohol Use: No    OB History    Grav Para Term Preterm Abortions TAB SAB Ect Mult Living                  Review of Systems    Constitutional: Negative for fever.       10 Systems reviewed and are negative for acute change except as noted in the HPI.  HENT: Negative for congestion.   Eyes: Negative for discharge and redness.  Respiratory: Positive for cough, shortness of breath and wheezing.   Cardiovascular: Positive for chest pain. Negative for leg swelling.  Gastrointestinal: Negative for vomiting and abdominal pain.  Musculoskeletal: Negative for back pain.  Skin: Negative for rash.  Neurological: Positive for weakness. Negative for syncope, numbness and headaches.  Psychiatric/Behavioral:       No behavior change.    Allergies  Aspirin  Home Medications   Current Outpatient Rx  Name Route Sig Dispense Refill  . ALBUTEROL SULFATE HFA 108 (90 BASE) MCG/ACT IN AERS Inhalation Inhale 2 puffs into the lungs every 4 (four) hours as needed. For shortness of breath.    . ALBUTEROL SULFATE (2.5 MG/3ML) 0.083% IN NEBU Nebulization Take 2.5 mg by nebulization every 6 (six) hours as needed. For shortness of breath.    Marland Kitchen FLUTICASONE-SALMETEROL 250-50 MCG/DOSE IN AEPB Inhalation Inhale 1 puff into the lungs 2 (two) times daily. 1 each 0    Samples of this drug were given to the patient, qu ...  . ZOLPIDEM TARTRATE  5 MG PO TABS Oral Take 5 mg by mouth at bedtime as needed. For sleep.    Marland Kitchen DOXYCYCLINE HYCLATE 100 MG PO CAPS Oral Take 1 capsule (100 mg total) by mouth 2 (two) times daily. One po bid x 7 days 10 capsule 0  . IPRATROPIUM BROMIDE HFA 17 MCG/ACT IN AERS Inhalation Inhale 2 puffs into the lungs every 6 (six) hours as needed for wheezing. 1 Inhaler 12  . PREDNISONE 10 MG PO TABS  67m (3 tabs) daily for 10 days, then 267m(2 tabs) daily for 3 days, then 1071m1 tab) daily for 4 days.  Take with breakfast. 40 tablet 0    BP 123/84  Pulse 76  Temp(Src) 97.1 F (36.2 C) (Oral)  Resp 20  Ht _0  (1.575 m)  Wt 114 lb 6.7 oz (51.9 kg)  BMI 20.93 kg/m2  SpO2 100%  Physical Exam  Nursing note and vitals  reviewed. Constitutional:       Awake, alert, nontoxic appearance.  HENT:  Head: Atraumatic.  Eyes: Right eye exhibits no discharge. Left eye exhibits no discharge.  Neck: Neck supple.  Cardiovascular: Regular rhythm.   No murmur heard.      Tachycardic  Pulmonary/Chest: She is in respiratory distress. She has wheezes. She has no rales. She exhibits tenderness.       The patient has diffuse expiratory wheezes with decreased breath sounds bilaterally retractions, accessory muscle usage able to speak short phrases with moderate respiratory distress and mild hypoxia with room air pulse oximetry 90% upon arrival  Abdominal: Soft. There is no tenderness. There is no rebound.  Musculoskeletal: She exhibits no edema and no tenderness.       Baseline ROM, no obvious new focal weakness.  Neurological: She is alert.       Mental status and motor strength appears baseline for patient and situation.  Skin: No rash noted.  Psychiatric: She has a normal mood and affect.    ED Course  Procedures (including critical care time) ECG: Sinus rhythm, ventricular rate 90, right axis deviation, nonspecific T wave changes, no significant change noted compared with October Tussi-12 Labs Reviewed  BASIC METABOLIC PANEL - Abnormal; Notable for the following:    Glucose, Bld 146 (*)    GFR calc non Af Amer 76 (*)    GFR calc Af Amer 88 (*)    All other components within normal limits  CBC - Abnormal; Notable for the following:    RBC 5.33 (*)    All other components within normal limits  DIFFERENTIAL - Abnormal; Notable for the following:    Monocytes Relative 15 (*)    All other components within normal limits  BASIC METABOLIC PANEL - Abnormal; Notable for the following:    Glucose, Bld 127 (*)    All other components within normal limits  GLUCOSE, CAPILLARY - Abnormal; Notable for the following:    Glucose-Capillary 135 (*)    All other components within normal limits  POCT I-STAT TROPONIN I   Dg  Chest Port 1 View  12/15/2011  *RADIOLOGY REPORT*  Clinical Data: Cough and shortness of breath.  PORTABLE CHEST - 1 VIEW  Comparison: Chest 08/15/2011.  CT chest 06/13/2011.  Findings: The chest is markedly hyperexpanded with attenuation of the pulmonary vasculature.  Lungs are clear.  Heart size is normal. No pneumothorax or pleural effusion.  IMPRESSION: Emphysema without acute disease.  Original Report Authenticated By: THOArvid Right'ALESSIO, M.D.     1. COPD  exacerbation   2. Hypoxia   3. Insomnia   4. Cough   5. COPD (chronic obstructive pulmonary disease)       MDM  Patient / Family / Caregiver understand and agree with initial ED impression and plan with expectations set for ED visit.  Pt stable in ED with no significant deterioration in condition.  Patient / Family / Caregiver informed of clinical course, understand medical decision-making process, and agree with plan. The patient appears reasonably stabilized for admission considering the current resources, flow, and capabilities available in the ED at this time, and I doubt any other [facility] requiring further screening and/or treatment in the ED prior to admission.      Babette Relic, MD 12/16/11 [phone]

## 2011-12-15 NOTE — ED Notes (Signed)
Pt remains on continuous nebulizer treatment. Vital signs stable. Work of breathing decreased since arrival to ED. She remains alert and oriented x4. Son at bedside.

## 2011-12-15 NOTE — ED Notes (Signed)
Pt presents to department for evaluation of COPD exacerbation/SOB. Pt states difficulty breathing started this afternoon, no relief with at home nebulizer treatments. Expiratory/inspiratory wheezing all fields. Labored and shallow breathing upon arrival. Speaking short sentences. Skin warm and dry. Denies pain at the time. She is conscious alert and oriented x4.

## 2011-12-15 NOTE — ED Notes (Signed)
C/o difficulty breathing x 30 minutes.  Pt unable to speak in complete sentences. Pursed lip breathing.

## 2011-12-15 NOTE — ED Notes (Signed)
Continuous nebulizer treatment completed. Pt now speaking complete sentences. Respirations unlabored. Expiratory wheezing noted. Pt states she feels much better. Placed on 2L nasal cannula.

## 2011-12-15 NOTE — H&P (Signed)
Hospital Admission Note Date: 12/15/2011  Patient name: Mary Jenkins Medical record number: [id number] Date of birth: 09-04-1956 Age: 56 y.o. Gender: female PCP: Rosalia Hammers, MD, MD  Medical Service: Internal Medicine  Attending physician:  Lynnae January   Pager: Resident (R2/R3): Leonia Reeves   Pager: [phone] Resident (R1): Victorio Palm   Pager: [phone]  Chief Complaint: shortness of breath  History of Present Illness: This is a 5 her old woman with a history of COPD, last admitted in November 2012 with COPD exacerbation, who presents with one-week progressively worsening cough and shortness of breath. Over the past 2 days in particular, her shortness of breath had worsened significantly. She has been increasing her inhaler and nebulizer use, but with incomplete relief. She has a mildly productive cough and does endorse rhinorrhea and sore throat for a similar duration, as well as subjective fevers. She denies sick contacts, recent travel, or pets. She also endorses a decreased appetite x2 weeks.  At the time of interview in the emergency room, the patient has experienced significant and dramatic improvement after treatment with steroids and nebulizers in the ED.  In addition, the patient describes a bilateral posterior headache at the skull base that radiates occasionally to the ears, as well as left ear pain. This has been going on for 3 weeks, with no aggravating or alleviating factors. The pain is throbbing in nature, and lasts for an unclear length of time for episode.  Denies chest pain, abdominal pain, dysuria, changes in bowel/bladder habits.   Past Medical History: Past Medical History  Diagnosis Date  . Breast cancer   . History of right mastectomy   . Personal history of chemotherapy   . COPD (chronic obstructive pulmonary disease) 06/24/2011  . Liver cyst 06/24/2011  . Smoking addiction 06/24/2011  . Insomnia 06/24/2011  . Shortness of breath   . Anemia    Past Surgical History    Procedure Date  . Mastectomy, partial 1997    right   . Tubal ligation     Meds: Prior to Admission medications   Medication Sig Start Date End Date Taking? Authorizing Provider  albuterol (PROVENTIL HFA;VENTOLIN HFA) 108 (90 BASE) MCG/ACT inhaler Inhale 2 puffs into the lungs every 4 (four) hours as needed. For shortness of breath. 12/03/11  Yes Bartholomew Crews, MD  albuterol (PROVENTIL) (2.5 MG/3ML) 0.083% nebulizer solution Take 2.5 mg by nebulization every 6 (six) hours as needed. For shortness of breath. 11/05/11  Yes Pedro Earls, MD  Fluticasone-Salmeterol (ADVAIR DISKUS) 250-50 MCG/DOSE AEPB Inhale 1 puff into the lungs 2 (two) times daily. 07/16/11 07/15/12 Yes Bartholomew Crews, MD  zolpidem (AMBIEN) 5 MG tablet Take 5 mg by mouth at bedtime as needed. For sleep. 06/24/11 06/23/12 Yes Rosalia Hammers, MD  atrovent nebs  Allergies: Aspirin  Family Hx: Family History  Problem Relation Age of Onset  . Hypertension Sister   . Colon cancer Neg Hx   . Esophageal cancer Neg Hx   . Stomach cancer Neg Hx   . Heart disease Brother   . Heart disease Brother   . Heart disease Brother     Social Hx: History   Social History  . Marital Status: Single    Spouse Name: N/A    Number of Children: N/A  . Years of Education: N/A   Occupational History  . Not on file.   Social History Main Topics  . Smoking status: Former Smoker -- 0.2 packs/day for 41 years    Types: Cigarettes  Quit date: 07/24/2011  . Smokeless tobacco: Never Used  . Alcohol Use: No  . Drug Use: No     In the past cocaine abuse. Quit 4 years ago.   Marland Kitchen Sexually Active: No   Other Topics Concern  . Not on file   Social History Narrative  . No narrative on file    Review of Systems: As per HPI  Physical Exam: Filed Vitals:   12/15/11 1732 12/15/11 1816 12/15/11 1840 12/15/11 1938  BP:  107/70 104/66 104/78  Pulse:  86 92   Temp:   99.2 F (37.3 C)   TempSrc:   Axillary   Resp:  _0 SpO2: 91% 100% 100% 98%   GEN: No apparent distress.  Alert and oriented x 3.  Pleasant, conversant, and cooperative to exam. HEENT: head is autraumatic and normocephalic.  Mild TTP over posterior b/l neck. RESP:  Speaking in complete sentences, at ease with conversation.  No accessory muscle use.  Very poor air movement, no wheezes appreciated. CARDIOVASCULAR: regular rate, normal rhythm.  distant S1, S2, no murmurs, gallops, or rubs. ABDOMEN: soft, non-tender, non-distended.  EXT: warm and dry. No clubbing or cyanosis.  No edema in b/l lower extremeties SKIN: warm and dry with normal turgor.  No rashes or abnormal lesions observed. NEURO: communicating w/o difficult, ambulating   Lab results: Basic Metabolic Panel:  Basename 12/15/11 1717  NA 138  K 4.2  CL 103  CO2 26  GLUCOSE 146*  BUN 10  CREATININE 0.85  CALCIUM 8.9  MG --  PHOS --   CBC:  Basename 12/15/11 1717  WBC 4.2  NEUTROABS 2.0  HGB 13.9  HCT 41.9  MCV 78.6  PLT 230    Imaging results:  Dg Chest Port 1 View  12/15/2011  *RADIOLOGY REPORT*  Clinical Data: Cough and shortness of breath.  PORTABLE CHEST - 1 VIEW  Comparison: Chest 08/15/2011.  CT chest 06/13/2011.  Findings: The chest is markedly hyperexpanded with attenuation of the pulmonary vasculature.  Lungs are clear.  Heart size is normal. No pneumothorax or pleural effusion.  IMPRESSION: Emphysema without acute disease.  Original Report Authenticated By: Arvid Right. Luther Parody, M.D.    Other results: EKG: NSR, no new ST or TW changes  Assessment & Plan by Problem: # COPD Exacerbation Patient with extensive smoking history, shortness of breath, last admitted for COPD exacerbation in November of 2012, who now presents with shortness of breath. She is afebrile with no leukocytosis and a clean chest x-ray. She was given steroids and nebulizer treatments in the ED, and has markedly improved in just a few hours. She was given the option to go home, however,  given her extensive history of COPD, she elected to stay for continued treatment in the hospital. She does have PFTs from 06/2011, but the test results were unreliable given reduced patient effort. We will continue to treat her for COPD exacerbation and hope she continues her quick recovery. Alternative etiologies include PE (low risk score), MI (few risk factors, initial troponin 0.00, normal EKG). - albuterol nebs q6h - atrovent nebs q6h  - prednisone 57m daily - doxycycline 1052mbid - tussionex q12  # HA Patient complains of 3 weeks bilateral posterior occipital and neck headache. Based on her description and tenderness on exam, it is possible these are musculoskeletal in origin given her chronic cough (noted in last clinic progress note) and recent worsening. We will treat her with Tylenol for pain and will  have to continue to consider alternative diagnoses such as tension headache, stress headache, or less likely migraine. - tylenol  # Chronic Cough Patient was noted to have chronic cough at previous clinic visit, thought possibly secondary to GERD given the worsening when she is prone. - con't GERD trial  # Liver Cyst See prior clinic note, but patient does have a liver lesion that was incidentally seen on a September 2012 CT scan. Followup CT, which was recommended at 3-6 months, as an outpatient has been difficult secondary to insurance difficulties. - Consider inpatient CT scan if indicated and/or feasible  # Ppx - lovenox

## 2011-12-16 ENCOUNTER — Encounter: Payer: Medicaid Other | Admitting: Internal Medicine

## 2011-12-16 LAB — BASIC METABOLIC PANEL
CO2: 24 mEq/L (ref 19–32)
Calcium: 8.4 mg/dL (ref 8.4–10.5)
Creatinine, Ser: 0.69 mg/dL (ref 0.50–1.10)
GFR calc non Af Amer: >90 mL/min (ref >90)
Glucose, Bld: 127 mg/dL — ABNORMAL HIGH (ref 70–99)

## 2011-12-16 LAB — GLUCOSE, CAPILLARY: Glucose-Capillary: 135 mg/dL — ABNORMAL HIGH (ref 70–99)

## 2011-12-16 MED ORDER — PREDNISONE 10 MG PO TABS: ORAL_TABLET | ORAL | Status: DC

## 2011-12-16 MED ORDER — DOXYCYCLINE HYCLATE 100 MG PO CAPS: 100.0000 mg | ORAL_CAPSULE | Freq: Two times a day (BID) | ORAL | Status: AC

## 2011-12-16 MED ORDER — IPRATROPIUM BROMIDE HFA 17 MCG/ACT IN AERS: 2.0000 | INHALATION_SPRAY | Freq: Four times a day (QID) | RESPIRATORY_TRACT | Status: DC | PRN

## 2011-12-16 NOTE — Discharge Summary (Signed)
Internal [facility] Discharge Note  Name: Mary Jenkins MRN: [id number] DOB: [birth year] 56 y.o.  Date of Admission: 12/15/2011  5:02 PM Date of Discharge: 12/16/2011 Attending Physician: Bartholomew Crews, MD  Discharge Diagnosis: #COPD Exacerbation #H/o liver cyst  Discharge Medications: Medication List  As of 12/16/2011  2:24 PM   TAKE these medications         albuterol (2.5 MG/3ML) 0.083% nebulizer solution   Commonly known as: PROVENTIL   Take 2.5 mg by nebulization every 6 (six) hours as needed. For shortness of breath.      albuterol 108 (90 BASE) MCG/ACT inhaler   Commonly known as: PROVENTIL HFA;VENTOLIN HFA   Inhale 2 puffs into the lungs every 4 (four) hours as needed. For shortness of breath.      doxycycline 100 MG capsule   Commonly known as: VIBRAMYCIN   Take 1 capsule (100 mg total) by mouth 2 (two) times daily. One po bid x 5 days      Fluticasone-Salmeterol 250-50 MCG/DOSE Aepb   Commonly known as: ADVAIR   Inhale 1 puff into the lungs 2 (two) times daily.      ipratropium 17 MCG/ACT inhaler   Commonly known as: ATROVENT HFA   Inhale 2 puffs into the lungs every 6 (six) hours as needed for wheezing.      predniSONE 10 MG tablet   Commonly known as: DELTASONE   39m (3 tabs) daily for 10 days, then 278m(2 tabs) daily for 3 days, then 1078m1 tab) daily for 4 days.  Take with breakfast.      zolpidem 5 MG tablet   Commonly known as: AMBIEN   Take 5 mg by mouth at bedtime as needed. For sleep.            Disposition and follow-up:   [facility]Send[facility] G [facility]  At hospital follow up, please assess recovery of COPD exacerbation.  Follow-up Appointments: Follow-up Information    [facility] (3:45pm)    Contact information:   [id number] [phone]      Discharge Orders    Future  Appointments: Provider: Department: Dept Phone: Center:   12/25/2011 3:45 PM MadCoralee PesaD [phone] 2:15 PM JasRosalia HammersD [phone]  Future Orders Please Complete By Expires   Diet - low sodium heart healthy      Increase activity slowly      Discharge instructions      Comments:   Please complete your course of antibiotics and your prednisone taper.  Be sure to go to your hospital follow up as scheduled.  Please bring all of your medications with you to your appointment.  If you have any new or worsening symptoms, please call the internal medicine outpatient clinic at [phone] Call MD for:  temperature >100.4      Call MD for:  persistant nausea and vomiting      Call MD for:  severe uncontrolled pain      Call MD for:  difficulty breathing, headache or visual disturbances      Call MD for:  hives      Call MD for:  persistant dizziness or light-headedness      Call MD for:  extreme fatigue  Procedures Performed:  Dg Chest Port 1 View 12/15/2011   IMPRESSION: Emphysema without acute disease.    Admission HPI:  This is a 54 her old woman with a history of COPD, last admitted in November 2012 with COPD exacerbation, who presents with one-week progressively worsening cough and shortness of breath. Over the past 2 days in particular, her shortness of breath had worsened significantly. She has been increasing her inhaler and nebulizer use, but with incomplete relief. She has a mildly productive cough and does endorse rhinorrhea and sore throat for a similar duration, as well as subjective fevers. She denies sick contacts, recent travel, or pets. She also endorses a decreased appetite x2 weeks.  At the time of interview in the emergency room, the patient has experienced significant and dramatic improvement after treatment with steroids and nebulizers in the ED.  In addition, the patient describes a bilateral  posterior headache at the skull base that radiates occasionally to the ears, as well as left ear pain. This has been going on for 3 weeks, with no aggravating or alleviating factors. The pain is throbbing in nature, and lasts for an unclear length of time for episode.  Denies chest pain, abdominal pain, dysuria, changes in bowel/bladder habits.  Admission Physical Exam:  Filed Vitals:    12/15/11 1732  12/15/11 1816  12/15/11 1840  12/15/11 1938   BP:   107/70  104/66  104/78   Pulse:   86  92    Temp:    99.2 F (37.3 C)    TempSrc:    Axillary    Resp:   _0 SpO2:  91%  100%  100%  98%    GEN: No apparent distress. Alert and oriented x 3. Pleasant, conversant, and cooperative to exam.  HEENT: head is autraumatic and normocephalic. Mild TTP over posterior b/l neck.  RESP: Speaking in complete sentences, at ease with conversation. No accessory muscle use. Very poor air movement, no wheezes appreciated.  CARDIOVASCULAR: regular rate, normal rhythm. distant S1, S2, no murmurs, gallops, or rubs.  ABDOMEN: soft, non-tender, non-distended.  EXT: warm and dry. No clubbing or cyanosis. No edema in b/l lower extremeties  SKIN: warm and dry with normal turgor. No rashes or abnormal lesions observed.  NEURO: communicating w/o difficult, ambulating   Admission Lab results:  Basic Metabolic Panel:   Basename  12/15/11 1717   NA  138   K  4.2   CL  103   CO2  26   GLUCOSE  146*   BUN  10   CREATININE  0.85   CALCIUM  8.9   MG  --   PHOS  --    CBC:   Basename  12/15/11 1717   WBC  4.2   NEUTROABS  2.0   HGB  13.9   HCT  41.9   MCV  78.6   PLT  230     Hospital Course by problem list:  #COPD Exacerbation: Improved SOB and cough by day of discharge. She was discharged with 5d course of doxy and prednisone taper, with plan to continue home nebs/inhalers.   #HA: Improved by day of discharge, likely related to cough/exacerbation.  No complaints at discharge.  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  Discharge Vitals:  BP 123/84  Pulse 76  Temp(Src) 97.1 F (36.2 C) (Oral)  Resp 20  Ht _0  (1.575 m)  Wt 114 lb 6.7 oz (51.9 kg)  BMI 20.93 kg/m2  SpO2 100%  Discharge Labs:  Results for orders placed during the hospital encounter of 12/15/11 (from the past 24 hour(s))  BASIC METABOLIC PANEL     Status: Abnormal   Collection Time   12/15/11  5:17 PM      Component Value Range   Sodium 138  135 - 145 (mEq/L)   Potassium 4.2  3.5 - 5.1 (mEq/L)   Chloride 103  96 - 112 (mEq/L)   CO2 26  19 - 32 (mEq/L)   Glucose, Bld 146 (*) 70 - 99 (mg/dL)   BUN 10  6 - 23 (mg/dL)   Creatinine, Ser 0.85  0.50 - 1.10 (mg/dL)   Calcium 8.9  8.4 - 10.5 (mg/dL)   GFR calc non Af Amer 76 (*) >90 (mL/min)   GFR calc Af Amer 88 (*) >90 (mL/min)  CBC     Status: Abnormal   Collection Time   12/15/11  5:17 PM      Component Value Range   WBC 4.2  4.0 - 10.5 (K/uL)   RBC 5.33 (*) 3.87 - 5.11 (MIL/uL)   Hemoglobin 13.9  12.0 - 15.0 (g/dL)   HCT 41.9  36.0 - 46.0 (%)   MCV 78.6  78.0 - 100.0 (fL)   MCH 26.1  26.0 - 34.0 (pg)   MCHC 33.2  30.0 - 36.0 (g/dL)   RDW 15.1  11.5 - 15.5 (%)   Platelets 230  150 - 400 (K/uL)  DIFFERENTIAL     Status: Abnormal   Collection Time   12/15/11  5:17 PM      Component Value Range   Neutrophils Relative 48  43 - 77 (%)   Neutro Abs 2.0  1.7 - 7.7 (K/uL)   Lymphocytes Relative 37  12 - 46 (%)   Lymphs Abs 1.6  0.7 - 4.0 (K/uL)   Monocytes Relative 15 (*) 3 - 12 (%)   Monocytes Absolute 0.6  0.1 - 1.0 (K/uL)   Eosinophils Relative 1  0 - 5 (%)   Eosinophils Absolute 0.0  0.0 - 0.7 (K/uL)   Basophils Relative 0  0 - 1 (%)   Basophils Absolute 0.0  0.0 - 0.1 (K/uL)  POCT I-STAT TROPONIN I     Status: Normal   Collection Time   12/15/11  5:26 PM      Component Value Range   Troponin i, poc 0.00  0.00 - 0.08 (ng/mL)   Comment 3             GLUCOSE, CAPILLARY     Status: Abnormal   Collection Time   12/15/11 10:43 PM      Component Value Range   Glucose-Capillary 135 (*) 70 - 99 (mg/dL)  BASIC METABOLIC PANEL     Status: Abnormal   Collection Time   12/16/11  5:58 AM      Component Value Range   Sodium 137  135 - 145 (mEq/L)   Potassium 4.3  3.5 - 5.1 (mEq/L)   Chloride 104  96 - 112 (mEq/L)   CO2 24  19 - 32 (mEq/L)   Glucose, Bld 127 (*) 70 - 99 (mg/dL)   BUN 10  6 - 23 (mg/dL)   Creatinine, Ser 0.69  0.50 - 1.10 (mg/dL)   Calcium 8.4  8.4 - 10.5 (mg/dL)   GFR calc non Af Amer >90  >90 (mL/min)  GFR calc Af Amer >90  >90 (mL/min)    Signed: KAPADIA, Laressa Bolinger 12/16/2011, 2:24 PM  I spent 30 min on this discharge.

## 2011-12-16 NOTE — Discharge Instructions (Signed)
Chronic Obstructive Pulmonary Disease Exacerbation Chronic obstructive pulmonary disease (COPD) is a condition that limits airflow. COPD may include chronic bronchitis, pulmonary emphysema, or both. A COPD exacerbation means that your COPD has gotten worse. Without treatment, this can be a life-threatening problem. COPD exacerbation requires immediate medical care. CAUSES  COPD exacerbation can be caused by:  Exposure to smoke.   Exposure to air pollution, chemical fumes, or dust.   Respiratory infections.   Genetics, particularly alpha 1-antitrypsin deficiency.   A condition in which the body's immune system attacks itself (autoimmunity).  SYMPTOMS   Increased coughing.   Increased wheezing.   Increased shortness of breath.   Swelling due to a buildup of fluid (peripheral edema) related to heart strain.   Rapid breathing.   Chest enlargement (barrel chest).   Chest tightness.  DIAGNOSIS  There is no single test that can diagnosis COPD exacerbation. Your history, physical exam, and other tests will help your caregiver make a diagnosis. Tests may include a chest X-ray, pulmonary function tests, spirometry, basic lab tests, and an arterial blood gas test. TREATMENT  Severe problems may require a stay in the hospital. Depending on the cause of your problems, the following may be prescribed:  Antibiotic medicines.   Bronchodilators (inhaled or tablets).   Cortisone medicines (inhaled or tablets).   Supplemental oxygen therapy.   Pulmonary rehabilitation. This is a broad program that may involve exercise, nutrition counseling, breathing techniques, and further education about your condition.  It is important to use good technique with inhaled medicines. Spacer devices may be needed to help improve drug delivery. HOME CARE INSTRUCTIONS   Do not smoke. Quitting smoking is very important to prevent worsening of COPD.   Avoid exposure to all substances that irritate the airway,  especially tobacco smoke.   If prescribed, take your antibiotics as directed. Finish them even if you start to feel better.   Only take over-the-counter or prescription medicines as directed by your caregiver.   Drink enough fluids to keep your urine clear or pale yellow. This can help thin bronchial secretions.   Use a cool mist vaporizer. This makes it easier to clear your chest when you cough.   If you have a home nebulizer and oxygen, continue to use them as directed.   Maintain all necessary vaccinations to prevent infections.   Exercise regularly.   Eat a healthy diet.   Keep all follow-up appointments as directed by your caregiver.  SEEK IMMEDIATE MEDICAL CARE IF:  You have extreme shortness of breath.   You have severe chest pain or blood in your sputum.   You have a high fever, weakness, repeated vomiting, or fainting.   You feel confused.  MAKE SURE YOU:   Understand these instructions.   Will watch your condition.   Will get help right away if you are not doing well or get worse.  Document Released: 07/07/2007 Document Revised: 08/29/2011 Document Reviewed: 05/07/2011 [employer] Patient Information 2012 [employer]. 

## 2011-12-16 NOTE — H&P (Signed)
Please see Dr Buel Ream H&P for full details.   Briefly, Mary Jenkins is a 56 yo female with COPD. She had PFT's 10/12 that indicated severe COPD but results were questionable as her effort was poor. Her only RF for COPD is prior tobacco use. She has had URI sxs for one week, with sig worsening the past two days. She has not gotten relief with increased neb and MDI usage. She also has a posterior / neck HA and anorexia.   In there ER, she received steroids, nebs, and ABX and had sig relief of her sxs.   She had a similiar episode and hospitalization in Nov 2012 and recovered after 4 day stay.  On PE, she is able to speak in full sentences and is in no resp distress. She has exp wheezes B in the bases but overall has poor air movement.   Labs, CXR reviewed.  A/P  1. COPD exac - She has responded to ABX, steroids, and nebs. She can cont these meds at home and is medically stable for D/C. She has Medicaid so will be able to afford meds.   2. HA - this has been daily for three weeks. No red flags. Use symptomatic treatment for now.  3. Liver cyst - stable on repeat imaging.  Stable for D/C home on ABX, steroids, and nebs. F/U clinic 1-2 weeks.

## 2011-12-16 NOTE — Progress Notes (Signed)
Subjective: Reports feeling significantly better this AM.  Improved appetite.  Decreased cough and SOB.  HA improved.  Objective: Vital signs in last 24 hours: Filed Vitals:   12/15/11 2307 12/15/11 2324 12/16/11 0131 12/16/11 0622  BP: 91/59   123/84  Pulse: 78 84 81 76  Temp: 97.7 F (36.5 C)   97.1 F (36.2 C)  TempSrc:      Resp: _0 Height: 5' 2" (1.575 m)     Weight: 114 lb 6.7 oz (51.9 kg)     SpO2: 99% 98% 99% 97%   Weight change:   Intake/Output Summary (Last 24 hours) at 12/16/11 0918 Last data filed at 12/16/11 0100  Gross per 24 hour  Intake 1918.75 ml  Output      0 ml  Net 1918.75 ml   Physical Exam: General: resting in bed HEENT: PERRL, EOMI, no scleral icterus Cardiac: RRR, no rubs, murmurs or gallops Pulm: decent air mvmt in upper lobes with very soft expiratory wheezing, but decreased air mvmt in lower loves, no resp distress Abd: soft, nontender, nondistended, BS present Ext: warm and well perfused, no pedal edema Neuro: alert and oriented X3  Lab Results: Basic Metabolic Panel:  Lab 03/00/92 0558 12/15/11 1717  NA 137 138  K 4.3 4.2  CL 104 103  CO2 24 26  GLUCOSE 127* 146*  BUN 10 10  CREATININE 0.69 0.85  CALCIUM 8.4 8.9  MG -- --  PHOS -- --   CBC:  Lab 12/15/11 1717  WBC 4.2  NEUTROABS 2.0  HGB 13.9  HCT 41.9  MCV 78.6  PLT 230   CBG:  Lab 12/15/11 2243  GLUCAP 135*    Studies/Results: Dg Chest Port 1 View  12/15/2011  *RADIOLOGY REPORT*  Clinical Data: Cough and shortness of breath.  PORTABLE CHEST - 1 VIEW  Comparison: Chest 08/15/2011.  CT chest 06/13/2011.  Findings: The chest is markedly hyperexpanded with attenuation of the pulmonary vasculature.  Lungs are clear.  Heart size is normal. No pneumothorax or pleural effusion.  IMPRESSION: Emphysema without acute disease.  Original Report Authenticated By: Arvid Right. Luther Parody, M.D.   Medications: I have reviewed the patient's current medications. Scheduled  Meds:   . albuterol  10 mg Nebulization Once  . albuterol  2.5 mg Nebulization Q6H  . chlorpheniramine-HYDROcodone  5 mL Oral Q12H  . doxycycline  100 mg Oral Q12H  . enoxaparin  40 mg Subcutaneous Q24H  . Fluticasone-Salmeterol  1 puff Inhalation BID  . ipratropium  0.5 mg Nebulization Once  . ipratropium  500 mcg Nebulization Q6H  . methylPREDNISolone (SOLU-MEDROL) injection  125 mg Intravenous Once  . pantoprazole  40 mg Oral Q1200  . predniSONE  60 mg Oral Q breakfast  . sodium chloride  1,000 mL Intravenous Once   Continuous Infusions:   . sodium chloride 125 mL/hr at 12/16/11 0846   PRN Meds:.acetaminophen, acetaminophen, zolpidem  Assessment/Plan:  #COPD Exacerbation: Improved SOB and cough. Will plan to d/c home today with home nebs/inhalers and prednisone taper.  She was treated with 1 dose of doxy last evening, but I do not suspect that she needs antibiotic coverage given fast recovery.  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

## 2011-12-25 ENCOUNTER — Ambulatory Visit (INDEPENDENT_AMBULATORY_CARE_PROVIDER_SITE_OTHER): Payer: Medicaid Other | Admitting: Internal Medicine

## 2011-12-25 ENCOUNTER — Encounter: Payer: Self-pay | Admitting: Internal Medicine

## 2011-12-25 VITALS — BP 122/75 | HR 73 | Temp 98.0°F | Ht 65.5 in | Wt 112.9 lb

## 2011-12-25 DIAGNOSIS — J449 Chronic obstructive pulmonary disease, unspecified: Secondary | ICD-10-CM

## 2011-12-25 MED ORDER — ALBUTEROL SULFATE (2.5 MG/3ML) 0.083% IN NEBU: 2.5000 mg | INHALATION_SOLUTION | Freq: Four times a day (QID) | RESPIRATORY_TRACT | Status: DC | PRN

## 2011-12-25 MED ORDER — PANTOPRAZOLE SODIUM 40 MG PO TBEC: 40.0000 mg | DELAYED_RELEASE_TABLET | Freq: Every day | ORAL | Status: DC

## 2011-12-25 MED ORDER — NICOTINE 21 MG/24HR TD PT24: 1.0000 | MEDICATED_PATCH | TRANSDERMAL | Status: DC

## 2011-12-25 NOTE — Assessment & Plan Note (Addendum)
Almost back to baseline Continue current inhalers. Would not prefer to continue to ipratropium for long time on prn bases, should be changed to tiotropium, but will defer to pcp Continue prednisone  Add ppi for gerd as cause of dry cough in am in setting of prednisone use Follow up in 1 week if continues to cough

## 2011-12-25 NOTE — Progress Notes (Signed)
Patient ID: Mary Jenkins, female   DOB: [birth year], 56 y.o.   MRN: [id number]  56 y/o w with pmh listed below comes for hfu Feeling much better Complaint with medications Back to baseline respi wise Has cough occasionally without sputum   Physical exam   General Appearance:     Filed Vitals:   12/25/11 1552  BP: 122/75  Pulse: 73  Temp: 98 F (36.7 C)  TempSrc: Oral  Height: 5' 5.5" (1.664 m)  Weight: 112 lb 14.4 oz (51.211 kg)  SpO2: 97%     Alert, cooperative, no distress, appears stated age  Head:    Normocephalic, without obvious abnormality, atraumatic  Eyes:    PERRL, conjunctiva/corneas clear, EOM's intact, fundi    benign, both eyes       Neck:   Supple, symmetrical, trachea midline, no adenopathy;       thyroid:  No enlargement/tenderness/nodules; no carotid   bruit or JVD  Lungs:    faint wheezes b/l with prolonged expi phase respirations unlabored  Chest wall:    No tenderness or deformity  Heart:    Regular rate and rhythm, S1 and S2 normal, no murmur, rub   or gallop  Abdomen:     Soft, non-tender, bowel sounds active all four quadrants,    no masses, no organomegaly  Extremities:   Extremities normal, atraumatic, no cyanosis or edema  Pulses:   2+ and symmetric all extremities  Skin:   Skin color, texture, turgor normal, no rashes or lesions  Neurologic:  nonfocal grossly   Ros  Constitutional: Denies fever, chills, diaphoresis, appetite change and fatigue.  Respiratory: as per hpi  Cardiovascular: Denies chest pain, palpitations and leg swelling.  Gastrointestinal: Denies nausea, vomiting, abdominal pain, diarrhea, constipation, blood in stool and abdominal distention.  Skin: Denies pallor, rash and wound.  Neurological: Denies dizziness, light-headedness, numbness and headaches.

## 2012-01-01 ENCOUNTER — Telehealth: Payer: Self-pay | Admitting: *Deleted

## 2012-01-01 ENCOUNTER — Emergency Department (HOSPITAL_COMMUNITY): Payer: Medicaid Other

## 2012-01-01 ENCOUNTER — Emergency Department (HOSPITAL_COMMUNITY)
Admission: EM | Admit: 2012-01-01 | Discharge: 2012-01-01 | Disposition: A | Discharge status: Home / Self Care | Payer: Medicaid Other | Attending: Emergency Medicine | Admitting: Emergency Medicine

## 2012-01-01 ENCOUNTER — Encounter (HOSPITAL_COMMUNITY): Payer: Self-pay | Admitting: *Deleted

## 2012-01-01 DIAGNOSIS — Z853 Personal history of malignant neoplasm of breast: Secondary | ICD-10-CM | POA: Insufficient documentation

## 2012-01-01 DIAGNOSIS — J4489 Other specified chronic obstructive pulmonary disease: Secondary | ICD-10-CM | POA: Insufficient documentation

## 2012-01-01 DIAGNOSIS — K219 Gastro-esophageal reflux disease without esophagitis: Secondary | ICD-10-CM | POA: Insufficient documentation

## 2012-01-01 DIAGNOSIS — J449 Chronic obstructive pulmonary disease, unspecified: Secondary | ICD-10-CM | POA: Insufficient documentation

## 2012-01-01 DIAGNOSIS — R079 Chest pain, unspecified: Secondary | ICD-10-CM | POA: Insufficient documentation

## 2012-01-01 DIAGNOSIS — Z87891 Personal history of nicotine dependence: Secondary | ICD-10-CM | POA: Insufficient documentation

## 2012-01-01 DIAGNOSIS — R0602 Shortness of breath: Secondary | ICD-10-CM | POA: Insufficient documentation

## 2012-01-01 LAB — POCT I-STAT TROPONIN I
Troponin i, poc: 0 ng/mL (ref 0.00–0.08)
Troponin i, poc: 0 ng/mL (ref 0.00–0.08)

## 2012-01-01 LAB — CBC
HCT: 41 % (ref 36.0–46.0)
MCHC: 31.5 g/dL (ref 30.0–36.0)
MCV: 81.3 fL (ref 78.0–100.0)
Platelets: 260 10*3/uL (ref 150–400)
RDW: 17.3 % — ABNORMAL HIGH (ref 11.5–15.5)

## 2012-01-01 LAB — COMPREHENSIVE METABOLIC PANEL
Albumin: 3.4 g/dL — ABNORMAL LOW (ref 3.5–5.2)
BUN: 12 mg/dL (ref 6–23)
Calcium: 9 mg/dL (ref 8.4–10.5)
Creatinine, Ser: 0.94 mg/dL (ref 0.50–1.10)
Total Protein: 6.4 g/dL (ref 6.0–8.3)

## 2012-01-01 MED ORDER — GI COCKTAIL ~~LOC~~
30.0000 mL | Freq: Once | ORAL | Status: AC
  Administered 2012-01-01: 30 mL via ORAL
  Filled 2012-01-01: qty 30

## 2012-01-01 NOTE — ED Provider Notes (Signed)
History     CSN: [id number]  Arrival date & time 01/01/12  6962   First MD Initiated Contact with Patient 01/01/12 [phone]      Chief Complaint  Patient presents with  . Chest Pain  . Shortness of Breath    (Consider location/radiation/quality/duration/timing/severity/associated sxs/prior treatment) HPI Patient is a 56 yo female who presents complaining of 30 minutes of substernal chest pain lasting 30 minutes.  She has no history of CAD, HLD or DM.  She is a former smoker with history of HTN.  Pain occurred at rest and resolved spontaneously.  She did not a belch at the termination of this.  She did not have ASA prior to arrival and feels that this was different then prior episodes of heart burn.  She denies cough, fever, injury, or variation with respiration.  Enid Derry  Past Medical History  Diagnosis Date  . Breast cancer   . History of right mastectomy   . Personal history of chemotherapy   . COPD (chronic obstructive pulmonary disease) 06/24/2011  . Liver cyst 06/24/2011  . Smoking addiction 06/24/2011  . Insomnia 06/24/2011  . Shortness of breath   . Anemia     Past Surgical History  Procedure Date  . Mastectomy, partial 1997    right   . Tubal ligation     Family History  Problem Relation Age of Onset  . Hypertension Sister   . Colon cancer Neg Hx   . Esophageal cancer Neg Hx   . Stomach cancer Neg Hx   . Heart disease Brother   . Heart disease Brother   . Heart disease Brother     History  Substance Use Topics  . Smoking status: Former Smoker -- 0.2 packs/day for 41 years    Types: Cigarettes    Quit date: 07/24/2011  . Smokeless tobacco: Never Used  . Alcohol Use: No    OB History    Grav Para Term Preterm Abortions TAB SAB Ect Mult Living                  Review of Systems  Constitutional: Negative.   HENT: Negative.   Eyes: Negative.   Respiratory: Negative.   Cardiovascular: Positive for chest pain.  Gastrointestinal: Negative.     Genitourinary: Negative.   Musculoskeletal: Negative.   Skin: Negative.   Neurological: Negative.   Hematological: Negative.   Psychiatric/Behavioral: Negative.   All other systems reviewed and are negative.    Allergies  Aspirin  Home Medications   Current Outpatient Rx  Name Route Sig Dispense Refill  . ALBUTEROL SULFATE HFA 108 (90 BASE) MCG/ACT IN AERS Inhalation Inhale 2 puffs into the lungs every 4 (four) hours as needed. For shortness of breath.    . ALBUTEROL SULFATE (2.5 MG/3ML) 0.083% IN NEBU Nebulization Take 3 mLs (2.5 mg total) by nebulization every 6 (six) hours as needed. For shortness of breath. 75 mL 1  . FLUTICASONE-SALMETEROL 250-50 MCG/DOSE IN AEPB Inhalation Inhale 1 puff into the lungs 2 (two) times daily. 1 each 0    Samples of this drug were given to the patient, qu ...  . IPRATROPIUM BROMIDE HFA 17 MCG/ACT IN AERS Inhalation Inhale 2 puffs into the lungs every 6 (six) hours as needed for wheezing. 1 Inhaler 12  . ADULT MULTIVITAMIN W/MINERALS CH Oral Take 1 tablet by mouth daily.    Marland Kitchen NICOTINE 14 MG/24HR TD PT24 Transdermal Place 1 patch onto the skin daily.    Marland Kitchen PANTOPRAZOLE SODIUM  40 MG PO TBEC Oral Take 1 tablet (40 mg total) by mouth daily. 30 tablet 1  . ZOLPIDEM TARTRATE 5 MG PO TABS Oral Take 5 mg by mouth at bedtime as needed. For sleep.      BP 124/63  Pulse 84  Temp(Src) 98.3 F (36.8 C) (Oral)  Resp 18  SpO2 97%  Physical Exam  Nursing note and vitals reviewed. GEN: Well-developed, well-nourished female in no distress HEENT: Atraumatic, normocephalic. Oropharynx clear without erythema EYES: PERRLA BL, no scleral icterus. NECK: Trachea midline, no meningismus CV: regular rate and rhythm. No murmurs, rubs, or gallops PULM: No respiratory distress.  No crackles, wheezes, or rales. GI: soft, non-tender. No guarding, rebound, or tenderness. + bowel sounds  GU: deferred Neuro: cranial nerves 2-12 intact, no abnormalities of strength or  sensation, A and O x 3 MSK: Patient moves all 4 extremities symmetrically, no deformity, edema, or injury noted Skin: No rashes petechiae, purpura, or jaundice Psych: no abnormality of mood   ED Course  Procedures (including critical care time)   Date: 01/02/2012  Rate: 101  Rhythm: sinus tachycardia  QRS Axis: right  Intervals: normal  ST/T Wave abnormalities: nonspecific T wave changes  Conduction Disutrbances:none  Narrative Interpretation:   Old EKG Reviewed: unchanged   Labs Reviewed  CBC - Abnormal; Notable for the following:    MCH 25.6 (*)    RDW 17.3 (*)    All other components within normal limits  COMPREHENSIVE METABOLIC PANEL - Abnormal; Notable for the following:    Albumin 3.4 (*)    GFR calc non Af Amer 67 (*)    GFR calc Af Amer 78 (*)    All other components within normal limits  POCT I-STAT TROPONIN I  POCT I-STAT TROPONIN I   Dg Chest 2 View  01/01/2012  *RADIOLOGY REPORT*  Clinical Data: Shortness of breath, wheezing, chest pain.  CHEST - 2 VIEW  Comparison: 12/15/2011  Findings: There is hyperinflation of the lungs compatible with COPD.  Heart and mediastinal contours are within normal limits.  No focal opacities or effusions.  No acute bony abnormality.  Surgical clips in the right axilla.  IMPRESSION: COPD. No active cardiopulmonary disease.  Original Report Authenticated By: Raelyn Number, M.D.     1. Chest pain       MDM  Patient was evaluated and treated with ASA.  She also received a GI cocktail.  Pain was resolved prior to presentation.  Eval was negative with negative TNI at 0 and 3 hours.  Patient had pulmonary disease pattern on ECG consistent with her COPD.  She was discharged in good condition with instructions to start the medication for reflux prescribed by her PCP for GERD and to follow-up with them.  Her symptoms were more consistent with GERD than ACS.  She was discharged in good condition and was comfortable with  plan.        Chauncy Passy, MD 01/02/12 1133

## 2012-01-01 NOTE — ED Notes (Signed)
Pt was received to RM 7 with c/o sudden onset of chest discomfort with shortness of breath this morning. Pt claimed that the pain radiated to her lt arm with tingling noted. Pt was attached to the cardiac monitor. Family is at the bedside

## 2012-01-01 NOTE — Discharge Instructions (Signed)
Chest Pain (Nonspecific) It is often hard to give a specific diagnosis for the cause of chest pain. There is always a chance that your pain could be related to something serious, such as a heart attack or a blood clot in the lungs. You need to follow up with your caregiver for further evaluation. CAUSES   Heartburn.   Pneumonia or bronchitis.   Anxiety or stress.   Inflammation around your heart (pericarditis) or lung (pleuritis or pleurisy).   A blood clot in the lung.   A collapsed lung (pneumothorax). It can develop suddenly on its own (spontaneous pneumothorax) or from injury (trauma) to the chest.   Shingles infection (herpes zoster virus).  The chest wall is composed of bones, muscles, and cartilage. Any of these can be the source of the pain.  The bones can be bruised by injury.   The muscles or cartilage can be strained by coughing or overwork.   The cartilage can be affected by inflammation and become sore (costochondritis).  DIAGNOSIS  Lab tests or other studies, such as X-rays, electrocardiography, stress testing, or cardiac imaging, may be needed to find the cause of your pain.  TREATMENT   Treatment depends on what may be causing your chest pain. Treatment may include:   Acid blockers for heartburn.   Anti-inflammatory medicine.   Pain medicine for inflammatory conditions.   Antibiotics if an infection is present.   You may be advised to change lifestyle habits. This includes stopping smoking and avoiding alcohol, caffeine, and chocolate.   You may be advised to keep your head raised (elevated) when sleeping. This reduces the chance of acid going backward from your stomach into your esophagus.   Most of the time, nonspecific chest pain will improve within 2 to 3 days with rest and mild pain medicine.  HOME CARE INSTRUCTIONS   If antibiotics were prescribed, take your antibiotics as directed. Finish them even if you start to feel better.   For the next few  days, avoid physical activities that bring on chest pain. Continue physical activities as directed.   Do not smoke.   Avoid drinking alcohol.   Only take over-the-counter or prescription medicine for pain, discomfort, or fever as directed by your caregiver.   Follow your caregiver's suggestions for further testing if your chest pain does not go away.   Keep any follow-up appointments you made. If you do not go to an appointment, you could develop lasting (chronic) problems with pain. If there is any problem keeping an appointment, you must call to reschedule.  SEEK MEDICAL CARE IF:   You think you are having problems from the medicine you are taking. Read your medicine instructions carefully.   Your chest pain does not go away, even after treatment.   You develop a rash with blisters on your chest.  SEEK IMMEDIATE MEDICAL CARE IF:   You have increased chest pain or pain that spreads to your arm, neck, jaw, back, or abdomen.   You develop shortness of breath, an increasing cough, or you are coughing up blood.   You have severe back or abdominal pain, feel nauseous, or vomit.   You develop severe weakness, fainting, or chills.   You have a fever.  THIS IS AN EMERGENCY. Do not wait to see if the pain will go away. Get medical help at once. Call your local emergency services (911 in [place] Do not drive yourself to the hospital. MAKE SURE YOU:   Understand these instructions.  Will watch your condition.   Will get help right away if you are not doing well or get worse.  Document Released: 06/19/2005 Document Revised: 08/29/2011 Document Reviewed: 04/14/2008 [employer] Patient Information 2012 [place].

## 2012-01-01 NOTE — Telephone Encounter (Signed)
Agree that severe chest pain requires emergent evaluation in the nearest Emergency Department.

## 2012-01-01 NOTE — Telephone Encounter (Signed)
Returned pt's call.  Pt stated she's having "terrible" chest pain in her mid chest; with tingling of right arm. Instructed pt to call 911. Stated she had called someone to take her to the hospital (I told her ED not here at the clinic).  But I told her she need to call 911 then she said someone was calling her back; and she hung up. I called pt back to make sure she had left home. Apparently telephone # is a cell # [phone]); the person who answered the phone stated she is on her  Way to the ED and we were disconnected.

## 2012-01-01 NOTE — ED Notes (Signed)
Reports onset of mid chest pain 30 min ago, radiates into right arm and hand. Then pt became sob, hx of copd. spo2 93% at triage, ekg being done.

## 2012-01-07 ENCOUNTER — Other Ambulatory Visit: Payer: Self-pay | Admitting: *Deleted

## 2012-01-07 MED ORDER — PREDNISONE 10 MG PO TABS: ORAL_TABLET | ORAL | Status: AC

## 2012-01-07 NOTE — Telephone Encounter (Signed)
Rx called in

## 2012-01-07 NOTE — Telephone Encounter (Signed)
Pt states she needs refill on prednisone. Her cough is not better and she needs to use inhaler more that she should.

## 2012-02-03 ENCOUNTER — Encounter: Payer: Self-pay | Admitting: Internal Medicine

## 2012-02-03 ENCOUNTER — Ambulatory Visit (INDEPENDENT_AMBULATORY_CARE_PROVIDER_SITE_OTHER): Payer: Medicaid Other | Admitting: Internal Medicine

## 2012-02-03 VITALS — BP 110/72 | HR 80 | Temp 97.1°F | Ht 65.5 in | Wt 119.4 lb

## 2012-02-03 DIAGNOSIS — J4489 Other specified chronic obstructive pulmonary disease: Secondary | ICD-10-CM

## 2012-02-03 DIAGNOSIS — K219 Gastro-esophageal reflux disease without esophagitis: Secondary | ICD-10-CM | POA: Insufficient documentation

## 2012-02-03 DIAGNOSIS — R200 Anesthesia of skin: Secondary | ICD-10-CM | POA: Insufficient documentation

## 2012-02-03 DIAGNOSIS — R209 Unspecified disturbances of skin sensation: Secondary | ICD-10-CM

## 2012-02-03 DIAGNOSIS — K5900 Constipation, unspecified: Secondary | ICD-10-CM

## 2012-02-03 DIAGNOSIS — Z23 Encounter for immunization: Secondary | ICD-10-CM | POA: Insufficient documentation

## 2012-02-03 DIAGNOSIS — J449 Chronic obstructive pulmonary disease, unspecified: Secondary | ICD-10-CM

## 2012-02-03 DIAGNOSIS — K7689 Other specified diseases of liver: Secondary | ICD-10-CM

## 2012-02-03 DIAGNOSIS — R079 Chest pain, unspecified: Secondary | ICD-10-CM | POA: Insufficient documentation

## 2012-02-03 HISTORY — DX: Gastro-esophageal reflux disease without esophagitis: K21.9

## 2012-02-03 MED ORDER — DOCUSATE SODIUM 100 MG PO CAPS: 100.0000 mg | ORAL_CAPSULE | Freq: Two times a day (BID) | ORAL | Status: DC

## 2012-02-03 MED ORDER — FLUTICASONE-SALMETEROL 500-50 MCG/DOSE IN AEPB: 1.0000 | INHALATION_SPRAY | Freq: Two times a day (BID) | RESPIRATORY_TRACT | Status: DC

## 2012-02-03 NOTE — Assessment & Plan Note (Signed)
Patient was given DTaP vaccination today.

## 2012-02-03 NOTE — Assessment & Plan Note (Addendum)
Patient's COPD is well controlled. Shortness of breath with mild exertion is present. Had multiple ED visits and hospitalization until 11/2011. Currently on Advair 250/50 mcg. I will increase it to 500/50 MCG. I further counseled her on the importance about not smoking and to avoiding secondary exposure as much as possible. Her daughter is present during this office visit and she noted that  does smoke.  I will refer patient to pulmonology for further evaluation and management for uncontrolled COPD

## 2012-02-03 NOTE — Assessment & Plan Note (Signed)
Pain most likely due to constipation. Prescribed Colace 100 g twice a day

## 2012-02-03 NOTE — Assessment & Plan Note (Signed)
Chronic. If patient has chronic vascular disease with long history of smoking. We'll continue to monitor at this point. Electrolytes last month were within normal.  limits.

## 2012-02-03 NOTE — Progress Notes (Signed)
Subjective:   Patient ID: [name] female   DOB: July 24, 1956 56 y.o.   MRN: [id number]  HPI: [name] is a 56 y.o. female with PMH significant as outlined below who presented to the clinic for regular office visit. Patient is complaining today about: 1. Chest pain: It started one month ago. It is located in the epigastric area. At worst it is 8/10 in severity. It does not radiate. No aggravating or alleviating factors. Patient and noted that most of the time it is in the morning when she wakes up and has an episode of cough. It occasionally occurs also went resting. Patient does not do exercise do to low shortness of breath with exertion. Patient was evaluated in the ED a month ago for chest pain. Cardiac enzymes were negative x2. Patient's symptoms resolved with GI cocktail. She occasionally takes aspirin, ibuprofen, and BC powder for leg cramps  2. Numbness and cramping in arm and leg : Patient reports whenever she holds her arm too long for example to calm her hair she would experience some cramping. She was noted cramping feeling in her legs. This has been going on for couple of months. Has not been experiencing worsening symptoms. Denies any dizziness, syncope.   3. Abdominal pain: Patient noted that she'll patient had some cramping in her left lower quadrant area. It improves after bowel movements. Noted that she does have constipation. Denies any diarrhea or changes in urinary habits.   4. COPD: Patient noted that since March she has not been in the emergency room are admitted to the hospital. She feels a lot better. Takes all her medication on a regular basis. She noted that she has been using albuterol inhaler only once a day in the morning. She would sneak one or 2 cigarettes into her house and with small prior to the last admission in March. She noted that this may have contributed to the multiple ED visit and hospitalization in the past. Patient reports that she has to hold on  to heart when she goes grocery shopping otherwise she is short of breath. She is not able to walk in [employer] since this is too big for her and she would get short of breast very soon. Denies any fever this or chills, short of breath or cough today   Past Medical History  Diagnosis Date  . Breast cancer   . History of right mastectomy   . Personal history of chemotherapy   . COPD (chronic obstructive pulmonary disease) 06/24/2011  . Liver cyst 06/24/2011  . Smoking addiction 06/24/2011  . Insomnia 06/24/2011  . Shortness of breath   . Anemia    Current Outpatient Prescriptions  Medication Sig Dispense Refill  . albuterol (PROVENTIL HFA;VENTOLIN HFA) 108 (90 BASE) MCG/ACT inhaler Inhale 2 puffs into the lungs every 4 (four) hours as needed. For shortness of breath.      Marland Kitchen albuterol (PROVENTIL) (2.5 MG/3ML) 0.083% nebulizer solution Take 3 mLs (2.5 mg total) by nebulization every 6 (six) hours as needed. For shortness of breath.  75 mL  1  . Fluticasone-Salmeterol (ADVAIR DISKUS) 250-50 MCG/DOSE AEPB Inhale 1 puff into the lungs 2 (two) times daily.  1 each  0  . ipratropium (ATROVENT HFA) 17 MCG/ACT inhaler Inhale 2 puffs into the lungs every 6 (six) hours as needed for wheezing.  1 Inhaler  12  . Multiple Vitamin (MULITIVITAMIN WITH MINERALS) TABS Take 1 tablet by mouth daily.      . nicotine (  NICODERM CQ - DOSED IN MG/24 HOURS) 14 mg/24hr patch Place 1 patch onto the skin daily.      . pantoprazole (PROTONIX) 40 MG tablet Take 1 tablet (40 mg total) by mouth daily.  30 tablet  1  . zolpidem (AMBIEN) 5 MG tablet Take 5 mg by mouth at bedtime as needed. For sleep.       Family History  Problem Relation Age of Onset  . Hypertension Sister   . Colon cancer Neg Hx   . Esophageal cancer Neg Hx   . Stomach cancer Neg Hx   . Heart disease Brother   . Heart disease Brother   . Heart disease Brother    History   Social History  . Marital Status: Single    Spouse Name: N/A    Number of  Children: N/A  . Years of Education: N/A   Social History Main Topics  . Smoking status: Former Smoker -- 41 years    Types: Cigarettes    Quit date: 07/24/2011  . Smokeless tobacco: Never Used  . Alcohol Use: No  . Drug Use: No     In the past cocaine abuse. Quit 4 years ago.   Marland Kitchen Sexually Active: No   Other Topics Concern  . None   Social History Narrative  . None   Review of Systems: Constitutional: Denies fever, chills, diaphoresis, appetite change and fatigue.  HEENT: Denies congestion, sore throat, rhinorrhea, sneezing,trouble swallowing, neck pain, neck stiffness and tinnitus.   Respiratory: Denies SOB, DOE, cough, chest tightness,  and wheezing.   Cardiovascular: Denies, palpitations and leg swelling.  Gastrointestinal: Denies nausea, vomiting, abdominal pain, diarrhea,  blood in stool and abdominal distention.  Genitourinary: Denies dysuria, urgency, frequency, hematuria, flank pain and difficulty urinating.  Skin: Denies pallor, rash and wound.  Neurological: Denies dizziness, syncope, weakness, light-headedness, numbness and headaches.  Psychiatric/Behavioral: Denies  mood changes  Objective:  Physical Exam: Filed Vitals:   02/03/12 1421  BP: 110/72  Pulse: 90  Temp: 97.1 F (36.2 C)  TempSrc: Oral  Height: 5' 5.5" (1.664 m)  Weight: 119 lb 6.4 oz (54.159 kg)   Constitutional: Vital signs reviewed.  Patient is a well-developed and well-nourished female in no acute distress and cooperative with exam. Alert and oriented x3.  Mouth: no erythema or exudates, MMM Neck: Supple,  Cardiovascular: RRR, S1 normal, S2 normal, no MRG, pulses symmetric and intact bilaterally Pulmonary/Chest: Few rhonchi at the basis. no wheezes, rales,  Abdominal: Soft. Non-tender, non-distended, bowel sounds are normal, no masses, or guarding present.  GU: no CVA tenderness Hematology: no cervical adenopathy.  Neurological: A&O x3, no focal motor deficit, sensory intact to light touch  bilaterally.  Skin: Warm, dry and intact. No rash, cyanosis, or clubbing.  Psychiatric: Normal mood and affect.

## 2012-02-03 NOTE — Patient Instructions (Addendum)
1. Do not smoke a single cigarette any more.  2. Try to exercise. You can do what ever you like :walking, running, swimming.  3. Avoid caffeine, chocolate and smoking as much as possible for heartburn 5. Avoid nonsteroidal, aspirin and BC powder

## 2012-02-13 ENCOUNTER — Encounter: Payer: Self-pay | Admitting: Internal Medicine

## 2012-02-13 ENCOUNTER — Ambulatory Visit (INDEPENDENT_AMBULATORY_CARE_PROVIDER_SITE_OTHER): Payer: Medicaid Other | Admitting: Internal Medicine

## 2012-02-13 VITALS — BP 102/68 | HR 86 | Temp 97.9°F | Ht 65.5 in | Wt 120.4 lb

## 2012-02-13 DIAGNOSIS — J4489 Other specified chronic obstructive pulmonary disease: Secondary | ICD-10-CM

## 2012-02-13 DIAGNOSIS — J449 Chronic obstructive pulmonary disease, unspecified: Secondary | ICD-10-CM

## 2012-02-13 MED ORDER — BUDESONIDE-FORMOTEROL FUMARATE 160-4.5 MCG/ACT IN AERO: INHALATION_SPRAY | RESPIRATORY_TRACT | Status: DC

## 2012-02-13 MED ORDER — TIOTROPIUM BROMIDE MONOHYDRATE 18 MCG IN CAPS: 18.0000 ug | ORAL_CAPSULE | Freq: Every day | RESPIRATORY_TRACT | Status: DC

## 2012-02-13 NOTE — Patient Instructions (Addendum)
Stop atrovent and advair  Start symbiocort Take 2 puffs first thing in am and then another 2 puffs about 12 hours later.   Take after only am dose Spiriva  Protonix 40 mg Take 30-60 min before first meal of the day   GERD (REFLUX)  is an extremely common cause of respiratory symptoms, many times with no significant heartburn at all.    It can be treated with medication, but also with lifestyle changes including avoidance of late meals, excessive alcohol, smoking cessation, and avoid fatty foods, chocolate, peppermint, colas, red wine, and acidic juices such as orange juice.  NO MINT OR MENTHOL PRODUCTS SO NO COUGH DROPS  USE SUGARLESS CANDY INSTEAD (jolley ranchers or Stover's)  NO OIL BASED VITAMINS - use powdered substitutes.    Only use your albuterol (Plan B= ventolin puffer,  Plan C is nebulizer) as a rescue medication to be used if you can't catch your breath by resting or doing a relaxed purse lip breathing pattern. The less you use it, the better it will work when you need it. Ok to use rescue up to every 4 hours if doing poorly.   Please schedule a follow up office visit in 4 weeks, sooner if needed

## 2012-02-13 NOTE — Progress Notes (Signed)
  Subjective:    Patient ID: Mary Jenkins, female    DOB: 02/05/1956  MRN: [id number]  HPI  5 yobm quit smoking  07/24/11 not taking breathing medications at that point and subsequently placed on multiple meds and still sob just getting dressed so referred from the medicine clinic at [facility] 02/13/2012 for pulmonary eval.  02/13/2012 1st pulmonary cc progressive worse x 6 months doe x 50-100 ft can't do a grocery store where could do before quit. No real variability, cough is better with no excess or purulent sputum.  Not using saba daytime because finds if she's holding still doesn't need it as oftern. Some better p last  Prednisone rx. Not much variability.  Sleeping ok without nocturnal  or early am exacerbation  of respiratory  c/o's or need for noct saba. Also denies any obvious fluctuation of symptoms with weather or environmental changes or other aggravating or alleviating factors except as outlined above     Review of Systems  Constitutional: Negative for fever, chills and unexpected weight change.  HENT: Positive for ear pain. Negative for nosebleeds, congestion, sore throat, rhinorrhea, sneezing, trouble swallowing, dental problem, voice change, postnasal drip and sinus pressure.   Eyes: Negative for visual disturbance.  Respiratory: Positive for cough and shortness of breath. Negative for choking.   Cardiovascular: Negative for chest pain and leg swelling.  Gastrointestinal: Negative for vomiting, abdominal pain and diarrhea.  Genitourinary: Negative for difficulty urinating.  Musculoskeletal: Negative for arthralgias.  Skin: Negative for rash.  Neurological: Negative for tremors, syncope and headaches.  Hematological: Does not bruise/bleed easily.       Objective:   Physical Exam amb thin wf nad Wt 120 02/13/2012  HEENT mild turbinate edema.  Oropharynx no thrush or excess pnd or cobblestoning.  No JVD or cervical adenopathy. Mild accessory muscle hypertrophy. Trachea midline,  nl thryroid. Chest was hyperinflated by percussion with diminished breath sounds and moderate increased exp time without wheeze. Hoover sign positive at mid inspiration. Regular rate and rhythm without murmur gallop or rub or increase P2 or edema.  Abd: no hsm, nl excursion. Ext warm without cyanosis or clubbing.     cxr 01/01/12 COPD. No active cardiopulmonary disease.     Assessment & Plan:

## 2012-02-13 NOTE — Assessment & Plan Note (Signed)
-  FEV1 0.39 ( 16%) ratio 23 and 16% better p B2    - HFA 75% p coaching  02/13/2012   Probably GOLD IV copd though the FV curve doesn't really look physiologic to me, it is c/w severe airflow obst in both the effort dep and indep portion.  DDX of  difficult airways managment all start with A and  include Adherence, Ace Inhibitors, Acid Reflux, Active Sinus Disease, Alpha 1 Antitripsin deficiency, Anxiety masquerading as Airways dz,  ABPA,  allergy(esp in young), Aspiration (esp in elderly), Adverse effects of DPI,  Active smokers, plus two Bs  = Bronchiectasis and Beta blocker use..and one C= CHF  Adherence is always the initial "prime suspect" and is a multilayered concern that requires a "trust but verify" approach in every patient - starting with knowing how to use medications, especially inhalers, correctly, keeping up with refills and understanding the fundamental difference between maintenance and prns vs those medications only taken for a very short course and then stopped and not refilled. The proper method of use, as well as anticipated side effects, of a metered-dose inhaler are discussed and demonstrated to the patient. Improved effectiveness after extensive coaching during this visit to a level of approximately  75% so try symbicort and spiriva then regroup in 4 weeks  ? Acid reflux > reviewed rx and diet  ? Adverse effect of dpi's esp advair > try off and challenge with spiriva

## 2012-03-04 ENCOUNTER — Encounter: Payer: Self-pay | Admitting: Internal Medicine

## 2012-03-06 ENCOUNTER — Telehealth: Payer: Self-pay | Admitting: Internal Medicine

## 2012-03-06 NOTE — Telephone Encounter (Signed)
Per MW- Alpha 1 test was neg. Spoke with pt and notified of this and she verbalized understanding and denied any questions.

## 2012-03-13 ENCOUNTER — Ambulatory Visit (INDEPENDENT_AMBULATORY_CARE_PROVIDER_SITE_OTHER): Payer: Medicaid Other | Admitting: Internal Medicine

## 2012-03-13 ENCOUNTER — Encounter: Payer: Self-pay | Admitting: Internal Medicine

## 2012-03-13 ENCOUNTER — Telehealth: Payer: Self-pay | Admitting: Internal Medicine

## 2012-03-13 VITALS — BP 102/78 | HR 74 | Temp 97.8°F | Ht 65.5 in | Wt 119.0 lb

## 2012-03-13 DIAGNOSIS — R05 Cough: Secondary | ICD-10-CM

## 2012-03-13 DIAGNOSIS — J449 Chronic obstructive pulmonary disease, unspecified: Secondary | ICD-10-CM

## 2012-03-13 MED ORDER — FAMOTIDINE 20 MG PO TABS: ORAL_TABLET | ORAL | Status: DC

## 2012-03-13 MED ORDER — PANTOPRAZOLE SODIUM 40 MG PO TBEC: 40.0000 mg | DELAYED_RELEASE_TABLET | Freq: Every day | ORAL | Status: DC

## 2012-03-13 MED ORDER — BUDESONIDE-FORMOTEROL FUMARATE 160-4.5 MCG/ACT IN AERO: INHALATION_SPRAY | RESPIRATORY_TRACT | Status: DC

## 2012-03-13 MED ORDER — TIOTROPIUM BROMIDE MONOHYDRATE 18 MCG IN CAPS: 18.0000 ug | ORAL_CAPSULE | Freq: Every day | RESPIRATORY_TRACT | Status: DC

## 2012-03-13 NOTE — Assessment & Plan Note (Addendum)
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  The proper method of use, as well as anticipated side effects, of a metered-dose inhaler are discussed and demonstrated to the patient. Improved effectiveness after extensive coaching during this visit to a level of approximately  90% with dpi but only 50% with mdi    Each maintenance medication was reviewed in detail including most importantly the difference between maintenance and as needed and under what circumstances the prns are to be used.  Please see instructions for details which were reviewed in writing and the patient given a copy.

## 2012-03-13 NOTE — Telephone Encounter (Signed)
rx has been sent

## 2012-03-13 NOTE — Progress Notes (Signed)
  Subjective:    Patient ID: Mary Jenkins, female    DOB: 1955-12-01  MRN: [id number]  HPI  40 yobm quit smoking  07/24/11 not taking breathing medications at that point and subsequently placed on multiple meds and still sob just getting dressed so referred from the medicine clinic at [facility] 02/13/2012 for pulmonary eval.  02/13/2012 1st pulmonary cc progressive worse x 6 months doe x 50-100 ft can't do a grocery store where could do before quit. No real variability, cough is better with no excess or purulent sputum.  Not using saba daytime because finds if she's holding still doesn't need it as oftern. Some better p last  Prednisone rx. Not much variability. rec Stop atrovent and advair Start symbiocort Take 2 puffs first thing in am and then another 2 puffs about 12 hours later.  Take after only am dose Spiriva Protonix 40 mg Take 30-60 min before first meal of the day  GERD diet Only use your albuterol (Plan B= ventolin puffer,  Plan C is nebulizer) as a rescue medication to be used if you can't catch your breath by resting or doing a relaxed purse lip breathing pattern. The less you use it, the better it will work when you need it. Ok to use rescue up to every 4 hours if doing poorly.     03/13/2012 f/u ov/Mary Jenkins cc breathing better but cough worse x 1-2 weeks esp when lie down and since ran out of ppi (didn't know she was supposed to refill despite above instructions). Min thick white mucus, then sleeps ok s exacerbation in am.  Denies variability or need for more than occ daytime saba. No neb use at all      Sleeping ok without nocturnal  or early am exacerbation  of respiratory  c/o's or need for noct saba. Also denies any obvious fluctuation of symptoms with weather or environmental changes or other aggravating or alleviating factors except as outlined above.  ROS  At present no c/o any significant sore throat, dysphagia, dental problems, itching, sneezing,  nasal congestion or excess/  purulent secretions, ear ache,   fever, chills, sweats, unintended wt loss, pleuritic or exertional cp, hemoptysis, palpitations, orthopnea pnd or leg swelling.  Also denies presyncope, palpitations, heartburn, abdominal pain, anorexia, nausea, vomiting, diarrhea  or change in bowel or urinary habits, change in stools or urine, dysuria,hematuria,  rash, arthralgias, visual complaints, headache, numbness weakness or ataxia or problems with walking or coordination. No noted change in mood/affect or memory.             Objective:   Physical Exam amb thin wf nad Wt 120 02/13/2012  > 119 03/13/2012  HEENT mild turbinate edema.  Oropharynx no thrush or excess pnd or cobblestoning.  No JVD or cervical adenopathy. Mild accessory muscle hypertrophy. Trachea midline, nl thryroid. Chest was hyperinflated by percussion with diminished breath sounds and moderate increased exp time without wheeze. Hoover sign positive at mid inspiration. Regular rate and rhythm without murmur gallop or rub or increase P2 or edema.  Abd: no hsm, nl excursion. Ext warm without cyanosis or clubbing.     cxr 01/01/12 COPD. No active cardiopulmonary disease.     Assessment & Plan:

## 2012-03-13 NOTE — Assessment & Plan Note (Signed)
Note this worsened off ppi while breathing improved  Strongly suggests  Classic Upper airway cough syndrome, so named because it's frequently impossible to sort out how much is  CR/sinusitis with freq throat clearing (which can be related to primary GERD)   vs  causing  secondary (" extra esophageal")  GERD from wide swings in gastric pressure that occur with throat clearing, often  promoting self use of mint and menthol lozenges that reduce the lower esophageal sphincter tone and exacerbate the problem further in a cyclical fashion.   These are the same pts (now being labeled as having "irritable larynx syndrome" by some cough centers) who not infrequently have a history of having failed to tolerate ace inhibitors,  dry powder inhalers or biphosphonates or report having atypical reflux symptoms that don't respond to standard doses of PPI , and are easily confused as having aecopd or asthma flares by even experienced allergists/ pulmonologists.  For now rec restart ppi plus hs h2 x 6 week trial then regroup.

## 2012-03-13 NOTE — Patient Instructions (Addendum)
Work on inhaler technique:  relax and gently blow all the way out then take a nice smooth deep breath back in, triggering the inhaler at same time you start breathing in.  Hold for up to 5 seconds if you can.  Rinse and gargle with water when done   If your mouth or throat starts to bother you,   I suggest you time the inhaler to your dental care and after using the inhaler(s) brush teeth and tongue with a baking soda containing toothpaste and when you rinse this out, gargle with it first to see if this helps your mouth and throat.     Start back on protonix 40 mg Take 30-60 min before first meal of the day and Pepcid 20 mg one at bedtime  GERD (REFLUX)  is an extremely common cause of respiratory symptoms, many times with no significant heartburn at all.    It can be treated with medication, but also with lifestyle changes including avoidance of late meals, excessive alcohol, smoking cessation, and avoid fatty foods, chocolate, peppermint, colas, red wine, and acidic juices such as orange juice.  NO MINT OR MENTHOL PRODUCTS SO NO COUGH DROPS  USE SUGARLESS CANDY INSTEAD (jolley ranchers or Stover's)  NO OIL BASED VITAMINS - use powdered substitutes.   Please schedule a follow up office visit in 6 weeks, call sooner if needed

## 2012-03-17 ENCOUNTER — Encounter: Payer: Self-pay | Admitting: Internal Medicine

## 2012-03-30 ENCOUNTER — Telehealth: Payer: Self-pay | Admitting: Internal Medicine

## 2012-03-30 NOTE — Telephone Encounter (Signed)
Called spoke with patient who reports that her cough is unimproved with occasional "very little" amounts of white mucus.  Denies tightness, wheezing, SOB, f/c/s.  Is still taking her symbicort, protonix and pepcid at bedtime.  Reports is following all other recs as well.  Pt aware MW not in office until Wed 7.10.13 and is ok with call back at that time.  Dr Melvyn Novas please advise, thanks.

## 2012-03-30 NOTE — Telephone Encounter (Signed)
Way to soon to judge response to changes made on last ov but this is best we can do for now - if not happy with progress she'll need to return with all acitve meds incuding otc s in hand to regroup   She should understand this  The standardized cough guidelines recently published in Chest by Lissa Morales in 2006  are a multiple step process (up to 12!) , not a single office visit,  and are intended  to address this problem logically,  with an alogrithm dependent on response to empiric treatment at  each progressive step  to determine a specific diagnosis with  minimal addtional testing needed.

## 2012-03-31 NOTE — Telephone Encounter (Signed)
I spoke with pt and is aware of MW response. She voiced her understanding and will give it a little bit longer to see if her cough improves. I advised pt to call back for appt if she is not happy with the improvement or if it worsens. She voiced her understanding and needed nothing further

## 2012-04-22 ENCOUNTER — Ambulatory Visit (INDEPENDENT_AMBULATORY_CARE_PROVIDER_SITE_OTHER): Payer: Medicaid Other | Admitting: Internal Medicine

## 2012-04-22 ENCOUNTER — Encounter: Payer: Self-pay | Admitting: Internal Medicine

## 2012-04-22 VITALS — BP 88/60 | HR 75 | Temp 97.4°F | Ht 65.5 in | Wt 117.0 lb

## 2012-04-22 DIAGNOSIS — R079 Chest pain, unspecified: Secondary | ICD-10-CM

## 2012-04-22 DIAGNOSIS — J449 Chronic obstructive pulmonary disease, unspecified: Secondary | ICD-10-CM

## 2012-04-22 NOTE — Progress Notes (Signed)
Subjective:    Patient ID: Mary Jenkins, female    DOB: 04-Jul-1956  MRN: [id number]  HPI  78 yobm quit smoking  07/24/11 not taking breathing medications at that point and subsequently placed on multiple meds and still sob just getting dressed so referred from the medicine clinic at [facility] 02/13/2012 for pulmonary eval.  02/13/2012 1st pulmonary cc progressive worse x 6 months doe x 50-100 ft can't do a grocery store where could do before quit. No real variability, cough is better with no excess or purulent sputum.  Not using saba daytime because finds if she's holding still doesn't need it as oftern. Some better p last  Prednisone rx. Not much variability. rec Stop atrovent and advair Start symbiocort Take 2 puffs first thing in am and then another 2 puffs about 12 hours later.  Take after only am dose Spiriva Protonix 40 mg Take 30-60 min before first meal of the day  GERD diet Only use your albuterol (Plan B= ventolin puffer,  Plan C is nebulizer) as a rescue medication to be used if you can't catch your breath by resting or doing a relaxed purse lip breathing pattern. The less you use it, the better it will work when you need it. Ok to use rescue up to every 4 hours if doing poorly.     03/13/2012 f/u ov/Mary Jenkins cc breathing better but cough worse x 1-2 weeks esp when lie down and since ran out of ppi (didn't know she was supposed to refill despite above instructions). Min thick white mucus, then sleeps ok s exacerbation in am.  Denies variability or need for more than occ daytime saba. No neb use at all  rec Work on inhaler technique:   Start back on protonix 40 mg Take 30-60 min before first meal of the day and Pepcid 20 mg one at bedtime GERD diet  04/22/2012 f/u ov/Mary Jenkins cc breathing better, dry cough worse x this week attributed to dietary discretion assoc with overt HB , maintaining on symbicort 160 2bid and spiriva. Really no limitation.  No purulent sputum or sinus symptoms  Sleeping  ok without nocturnal  or early am exacerbation  of respiratory  c/o's or need for noct saba. Also denies any obvious fluctuation of symptoms with weather or environmental changes or other aggravating or alleviating factors except as outlined above.  ROS  At present no c/o any significant sore throat, dysphagia, dental problems, itching, sneezing,  nasal congestion or excess/ purulent secretions, ear ache,   fever, chills, sweats, unintended wt loss, pleuritic or exertional cp, hemoptysis, palpitations, orthopnea pnd or leg swelling.  Also denies presyncope, palpitations, heartburn, abdominal pain, anorexia, nausea, vomiting, diarrhea  or change in bowel or urinary habits, change in stools or urine, dysuria,hematuria,  rash, arthralgias, visual complaints, headache, numbness weakness or ataxia or problems with walking or coordination. No noted change in mood/affect or memory.             Objective:   Physical Exam amb thin wf nad Wt 120 02/13/2012  > 119 03/13/2012 > 04/22/2012 117 HEENT mild turbinate edema.  Oropharynx no thrush or excess pnd or cobblestoning.  No JVD or cervical adenopathy. Mild accessory muscle hypertrophy. Trachea midline, nl thryroid. Chest was hyperinflated by percussion with diminished breath sounds and moderate increased exp time without wheeze. Hoover sign positive at mid inspiration. Regular rate and rhythm without murmur gallop or rub or increase P2 or edema.  Abd: no hsm, nl excursion. Ext warm without  cyanosis or clubbing.     cxr 01/01/12 COPD. No active cardiopulmonary disease.     Assessment & Plan:

## 2012-04-22 NOTE — Patient Instructions (Addendum)
Work on inhaler technique:  relax and gently blow all the way out then take a nice smooth deep breath back in, triggering the inhaler at same time you start breathing in.  Hold for up to 5 seconds if you can.  Rinse and gargle with water when done   If your mouth or throat starts to bother you,   I suggest you time the inhaler to your dental care and after using the inhaler(s) brush teeth and tongue with a baking soda containing toothpaste and when you rinse this out, gargle with it first to see if this helps your mouth and throat.     Best cough medication is delsym cough syrup  Please schedule a follow up visit in 3 months but call sooner if needed

## 2012-04-24 ENCOUNTER — Ambulatory Visit: Payer: Medicaid Other | Admitting: Internal Medicine

## 2012-04-24 NOTE — Assessment & Plan Note (Signed)
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 Improved effectiveness after extensive coaching during this visit to a level of approximately  50%   See instructions for specific recommendations which were reviewed directly with the patient who was given a copy with highlighter outlining the key components.

## 2012-04-24 NOTE — Assessment & Plan Note (Signed)
Likely due to GERD. Evaluated 01/01/12 in the ED with negative cardiac workup including cardiac enzymes and EKG. Recommend to take protonix on a daily basis and avoid Nonsteroidal, aspirin and BC powder. Furthermore advised the patient to reduce caffeine and chocolate  to help with reflux  Reviewed diet 04/22/12

## 2012-05-12 ENCOUNTER — Other Ambulatory Visit: Payer: Self-pay | Admitting: *Deleted

## 2012-05-12 DIAGNOSIS — J449 Chronic obstructive pulmonary disease, unspecified: Secondary | ICD-10-CM

## 2012-05-12 MED ORDER — ALBUTEROL SULFATE HFA 108 (90 BASE) MCG/ACT IN AERS: 2.0000 | INHALATION_SPRAY | RESPIRATORY_TRACT | Status: DC | PRN

## 2012-05-14 ENCOUNTER — Other Ambulatory Visit: Payer: Self-pay | Admitting: Internal Medicine

## 2012-05-14 DIAGNOSIS — J449 Chronic obstructive pulmonary disease, unspecified: Secondary | ICD-10-CM

## 2012-05-14 MED ORDER — ALBUTEROL SULFATE HFA 108 (90 BASE) MCG/ACT IN AERS: 2.0000 | INHALATION_SPRAY | Freq: Four times a day (QID) | RESPIRATORY_TRACT | Status: DC | PRN

## 2012-05-14 NOTE — Telephone Encounter (Signed)
Note from pharmacy - Medicaid will pay for Proair instead of Albuterol inhaler. Will need new Rx. [facility] as listed # [id number]. Hilda Blades Teion Ballin RN 05/14/12 10:30AM

## 2012-05-15 NOTE — Telephone Encounter (Signed)
Pharmacy called to ok generic ProAir for pt. Mary Jenkins [name] RN 05/15/12 11:50AM

## 2012-07-21 ENCOUNTER — Ambulatory Visit (INDEPENDENT_AMBULATORY_CARE_PROVIDER_SITE_OTHER): Payer: Medicaid Other | Admitting: Internal Medicine

## 2012-07-21 ENCOUNTER — Encounter: Payer: Self-pay | Admitting: Internal Medicine

## 2012-07-21 VITALS — BP 102/68 | HR 87 | Temp 98.2°F | Ht 65.5 in | Wt 122.0 lb

## 2012-07-21 DIAGNOSIS — Z23 Encounter for immunization: Secondary | ICD-10-CM

## 2012-07-21 DIAGNOSIS — J449 Chronic obstructive pulmonary disease, unspecified: Secondary | ICD-10-CM

## 2012-07-21 NOTE — Progress Notes (Signed)
Subjective:    Patient ID: Mary Jenkins, female    DOB: 05-27-1956  MRN: [id number]  HPI  7 yobm quit smoking  07/24/11 not taking breathing medications at that point and subsequently placed on multiple meds and still sob just getting dressed so referred from the medicine clinic at [facility] 02/13/2012 for pulmonary eval.  02/13/2012 1st pulmonary cc progressive worse x 6 months doe x 50-100 ft can't do a grocery store where could do before quit. No real variability, cough is better with no excess or purulent sputum.  Not using saba daytime because finds if she's holding still doesn't need it as oftern. Some better p last  Prednisone rx. Not much variability. rec Stop atrovent and advair Start symbiocort Take 2 puffs first thing in am and then another 2 puffs about 12 hours later.  Take after only am dose Spiriva Protonix 40 mg Take 30-60 min before first meal of the day  GERD diet Only use your albuterol (Plan B= ventolin puffer,  Plan C is nebulizer) as a rescue medication to be used if you can't catch your breath by resting or doing a relaxed purse lip breathing pattern. The less you use it, the better it will work when you need it. Ok to use rescue up to every 4 hours if doing poorly.     03/13/2012 f/u ov/Mary Jenkins cc breathing better but cough worse x 1-2 weeks esp when lie down and since ran out of ppi (didn't know she was supposed to refill despite above instructions). Min thick white mucus, then sleeps ok s exacerbation in am.  Denies variability or need for more than occ daytime saba. No neb use at all  rec Work on inhaler technique:   Start back on protonix 40 mg Take 30-60 min before first meal of the day and Pepcid 20 mg one at bedtime GERD diet  04/22/2012 f/u ov/Mary Jenkins cc breathing better, dry cough worse x this week attributed to dietary discretion assoc with overt HB , maintaining on symbicort 160 2bid and spiriva. Really no limitation.  No purulent sputum or sinus symptoms rec Work on  inhaler technique: .    Best cough medication is delsym cough syrup   07/21/2012 f/u ov/Mary Jenkins cc breathing better, rare daytime saba hfa, never neb. No obvious daytime variabilty or assoc chronic cough or cp or chest tightness, subjective wheeze overt sinus or hb symptoms. No unusual exp hx or h/o childhood pna/ asthma or premature birth to his knowledge.      Sleeping ok without nocturnal  or early am exacerbation  of respiratory  c/o's or need for noct saba. Also denies any obvious fluctuation of symptoms with weather or environmental changes or other aggravating or alleviating factors except as outlined above.  ROS  At present no c/o any significant sore throat, dysphagia, dental problems, itching, sneezing,  nasal congestion or excess/ purulent secretions, ear ache,   fever, chills, sweats, unintended wt loss, pleuritic or exertional cp, hemoptysis, palpitations, orthopnea pnd or leg swelling.  Also denies presyncope, palpitations, heartburn, abdominal pain, anorexia, nausea, vomiting, diarrhea  or change in bowel or urinary habits, change in stools or urine, dysuria,hematuria,  rash, arthralgias, visual complaints, headache, numbness weakness or ataxia or problems with walking or coordination. No noted change in mood/affect or memory.             Objective:   Physical Exam amb thin wf nad Wt 120 02/13/2012  > 119 03/13/2012 > 04/22/2012 117 > 07/21/2012 122  HEENT mild turbinate edema.  Oropharynx no thrush or excess pnd or cobblestoning.  No JVD or cervical adenopathy. Mild accessory muscle hypertrophy. Trachea midline, nl thryroid. Chest was hyperinflated by percussion with diminished breath sounds and moderate increased exp time without wheeze. Hoover sign positive at mid inspiration. Regular rate and rhythm without murmur gallop or rub or increase P2 or edema.  Abd: no hsm, nl excursion. Ext warm without cyanosis or clubbing.     cxr 01/01/12 COPD. No active cardiopulmonary disease.      Assessment & Plan:

## 2012-07-21 NOTE — Patient Instructions (Addendum)
If you are satisfied with your treatment plan let your doctor know and he/she can either refill your medications or you can return here when your prescription runs out.     If in any way you are not 100% satisfied,  please tell us.  If 100% better, tell your friends!

## 2012-07-21 NOTE — Assessment & Plan Note (Signed)
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  Please see instructions for details which were reviewed in writing and the patient given a copy.  pulmonary f/u can be prn

## 2012-07-22 ENCOUNTER — Ambulatory Visit (INDEPENDENT_AMBULATORY_CARE_PROVIDER_SITE_OTHER): Payer: Medicaid Other | Admitting: Internal Medicine

## 2012-07-22 ENCOUNTER — Telehealth: Payer: Self-pay | Admitting: *Deleted

## 2012-07-22 ENCOUNTER — Encounter: Payer: Self-pay | Admitting: Internal Medicine

## 2012-07-22 VITALS — BP 102/67 | HR 70 | Temp 97.6°F | Ht 65.5 in | Wt 120.4 lb

## 2012-07-22 DIAGNOSIS — J449 Chronic obstructive pulmonary disease, unspecified: Secondary | ICD-10-CM

## 2012-07-22 DIAGNOSIS — R202 Paresthesia of skin: Secondary | ICD-10-CM

## 2012-07-22 DIAGNOSIS — Z853 Personal history of malignant neoplasm of breast: Secondary | ICD-10-CM

## 2012-07-22 DIAGNOSIS — R209 Unspecified disturbances of skin sensation: Secondary | ICD-10-CM

## 2012-07-22 MED ORDER — GABAPENTIN 100 MG PO CAPS: 100.0000 mg | ORAL_CAPSULE | Freq: Two times a day (BID) | ORAL | Status: DC

## 2012-07-22 NOTE — Patient Instructions (Addendum)
Please start taking Gabapentin for nerve pain  Please you will be contacted to have an MRI of your neck  Please come back to the clinic after 2 weeks

## 2012-07-22 NOTE — Telephone Encounter (Signed)
Pt called c/o of right arm hurts, sore and has a knot. Hx of Br Ca - right mastectomy. Problem has been going on 1-2 years but getting worse. Problems with movement both arms. Tingling feeling both arms. Appt 07/22/12 2:15PM Dr Alice Rieger. Hilda Blades Jaime Grizzell RN 07/22/12 1:30PM

## 2012-07-23 DIAGNOSIS — Z853 Personal history of malignant neoplasm of breast: Secondary | ICD-10-CM | POA: Insufficient documentation

## 2012-07-23 LAB — BASIC METABOLIC PANEL
BUN: 13 mg/dL (ref 6–23)
Chloride: 107 mEq/L (ref 96–112)
Glucose, Bld: 88 mg/dL (ref 70–99)
Potassium: 5.4 mEq/L — ABNORMAL HIGH (ref 3.5–5.3)

## 2012-07-23 NOTE — Progress Notes (Signed)
Subjective:   Patient ID: Mary Jenkins female   DOB: 01/04/1956 56 y.o.   MRN: [id number]  HPI: [name] is a 57 y.o. with past medical history of COPD, breast cancer, status post left mastectomy with silicon implants. She presents to the clinic with worsening tingling and numbness of the upper extremities. She reports that she has had these symptoms for the last 1-1/2 years. However, over the last 1 month they have progressively worsened. 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 Symptoms are worse on the right side compared to the left. She notes that she drops things in her right hand. She also reports some associated pains in both fore and upper arms that is achy in nature and about 4/10 in severity and constant. The symptoms seem to be worsened by moving her arms like when she needs to make her hair. It feel like are arms are locked between the shoulder blades and can't move high enough. She also reports associated history of neck pains. She does not report any history of difficulty with speech, visual changes, headaches, or weakness in her legs.    Past Medical History  Diagnosis Date  . Breast cancer   . History of right mastectomy   . Personal history of chemotherapy   . COPD (chronic obstructive pulmonary disease) 06/24/2011  . Liver cyst 06/24/2011  . Smoking addiction 06/24/2011  . Insomnia 06/24/2011  . Shortness of breath   . Anemia   . GERD (gastroesophageal reflux disease) 02/03/2012   Current Outpatient Prescriptions  Medication Sig Dispense Refill  . albuterol (PROVENTIL HFA;VENTOLIN HFA) 108 (90 BASE) MCG/ACT inhaler Inhale 2 puffs into the lungs every 6 (six) hours as needed for wheezing.  1 Inhaler  0  . albuterol (PROVENTIL) (2.5 MG/3ML) 0.083% nebulizer solution Take 3 mLs (2.5 mg total) by nebulization every 6 (six) hours as needed. For shortness of breath.  75 mL  1  . budesonide-formoterol  (SYMBICORT) 160-4.5 MCG/ACT inhaler Take 2 puffs first thing in am and then another 2 puffs about 12 hours later.  1 Inhaler  12  . famotidine (PEPCID) 20 MG tablet One at bedtime  30 tablet  11  . pantoprazole (PROTONIX) 40 MG tablet Take 1 tablet (40 mg total) by mouth daily.  30 tablet  11  . tiotropium (SPIRIVA) 18 MCG inhalation capsule Place 1 capsule (18 mcg total) into inhaler and inhale daily.  30 capsule  6  . gabapentin (NEURONTIN) 100 MG capsule Take 1 capsule (100 mg total) by mouth 2 (two) times daily.  30 capsule  2   Family History  Problem Relation Age of Onset  . Hypertension Sister   . Colon cancer Neg Hx   . Esophageal cancer Neg Hx   . Stomach cancer Neg Hx   . Heart disease Brother   . Heart disease Brother   . Heart disease Brother    History   Social History  . Marital Status: Single    Spouse Name: N/A    Number of Children: N/A  . Years of Education: N/A   Social History Main Topics  . Smoking status: Former Smoker -- 2.5 packs/day for 41 years    Types: Cigarettes    Quit date: 07/24/2011  . Smokeless tobacco: Never Used  . Alcohol Use: No  . Drug Use: No     In the past cocaine abuse. Quit 4 years ago.   Marland Kitchen  Sexually Active: No   Other Topics Concern  . None   Social History Narrative  . None   Review of Systems: Review of Systems - General ROS: negative for - chills, fatigue, fever, malaise, night sweats or weight loss Breast ROS: negative Respiratory ROS: no cough, shortness of breath, or wheezing symptoms have been stable with her medications for COPD Cardiovascular ROS: no chest pain or dyspnea on exertion Gastrointestinal ROS: no abdominal pain, change in bowel habits, or black or bloody stools Neurological ROS: no TIA or stroke symptoms Objective:  Physical Exam: Filed Vitals:   07/22/12 1440  BP: 102/67  Pulse: 70  Temp: 97.6 F (36.4 C)  TempSrc: Oral  Height: 5' 5.5" (1.664 m)  Weight: 120 lb 6.4 oz (54.613 kg)  SpO2: 94%     Physical Examination: General appearance - alert, well appearing, and in no distress, oriented to person, place, and time, normal appearing weight and playful, active Neck - supple, no significant adenopathy, no area of tenderness noted in cervical spine. There is some noted restrictions in head movements, with inability to turn her head all the way to the sides. Chest - clear to auscultation, no wheezes, rales or rhonchi, symmetric air entry Heart - normal rate, regular rhythm, normal S1, S2, no murmurs, rubs, clicks or gallops Abdomen - soft, nontender, nondistended, no masses or organomegaly Breasts - breasts appear normal, no suspicious masses, no skin or nipple changes or axillary nodes, right breast with breast implants. Left breast - no lumps Back exam - full range of motion, no tenderness, palpable spasm or pain on motion Musculoskeletal - no joint tenderness, deformity or swelling Extremities - peripheral pulses normal, no pedal edema, no clubbing or cyanosis, no pedal edema noted, reduced sensation on touch right more than left, power is reduced to 4/5 in the right UE. There is notable pain/tenderness with abduction of the right shoulder but no point tenderness in her shoulder joint. DTR are normal. Pulses and skin look normal.  Assessment & Plan:  Assessment and plan has been discussed with Dr Murlean Caller as detailed under each problem.

## 2012-07-25 NOTE — Assessment & Plan Note (Signed)
Mary Jenkins was recently seen by her primary pulmonologist, and was advised to continue with her current medications for COPD. She does not report any new symptoms today, and she describes her disease is stable. Have not made any changes to her, medication. She'll continue with the inhalers including albuterol Symbicort and Spiriva.

## 2012-07-25 NOTE — Assessment & Plan Note (Addendum)
Mary Jenkins's  description of bilateral tingling and weakness in the upper extremities which has worsened over the last 1 month is concerning for cervical radiculopathy. She reports pain in her neck associated with her symptoms. Examination of the right upper extremity reveals reduced sensation, but with normal reflexes. She does not have tenderness in the muscles or bone. She winks with abduction of the right shoulder. However, there is no point tenderness to suggest joint abnormality. The left extremity has normal strength with normal sensation. Differential diagnosis for this presentation and physical exam include nerve entrapment and right shoulder arthritis. However cervical radiculopathy is more favored given the distribution of her tingling and numbness that is bilateral, associated with neck pain. Cervical, CT scan 4 years ago, revealed degenerative changes most notably C6-  7.  There were also Uncinate spurring congestion with disc height loss and facet hypertrophy narrows the right C6-7 foramen.  Plan -X-ray of the right shoulder to rule out right shoulder arthritis given reduced range of motion especially with abduction.. -MRI of the cervical spine - this will be beneficial in considering surgery. -gabapentin at 100 mg twice daily as this will help with her neuropathic pain. -I will contact the patient with the findings of the x-ray of the shoulder and MRI.

## 2012-07-28 ENCOUNTER — Other Ambulatory Visit: Payer: Self-pay | Admitting: Internal Medicine

## 2012-07-28 DIAGNOSIS — Z853 Personal history of malignant neoplasm of breast: Secondary | ICD-10-CM

## 2012-07-28 DIAGNOSIS — Z1231 Encounter for screening mammogram for malignant neoplasm of breast: Secondary | ICD-10-CM

## 2012-07-30 NOTE — Progress Notes (Signed)
INTERNAL MEDICINE TEACHING ATTENDING ADDENDUM - Dominic Pea, DO: I personally saw and evaluated Mary Jenkins in this clinic visit in conjunction with the resident, Dr. Alice Rieger. I have discussed patient's plan of care with medical resident during this visit. I have confirmed the physical exam findings and have read and agree with the clinic note including the plan.

## 2012-08-26 ENCOUNTER — Ambulatory Visit (HOSPITAL_COMMUNITY)
Admission: RE | Admit: 2012-08-26 | Discharge: 2012-08-26 | Disposition: A | Discharge status: Home / Self Care | Payer: Medicaid Other | Source: Ambulatory Visit | Attending: Internal Medicine | Admitting: Internal Medicine

## 2012-08-26 DIAGNOSIS — Z1231 Encounter for screening mammogram for malignant neoplasm of breast: Secondary | ICD-10-CM

## 2012-08-26 DIAGNOSIS — Z853 Personal history of malignant neoplasm of breast: Secondary | ICD-10-CM

## 2012-08-27 ENCOUNTER — Ambulatory Visit (HOSPITAL_COMMUNITY)
Admission: RE | Admit: 2012-08-27 | Discharge: 2012-08-27 | Disposition: A | Discharge status: Home / Self Care | Payer: Medicaid Other | Source: Ambulatory Visit | Attending: Internal Medicine | Admitting: Internal Medicine

## 2012-08-27 ENCOUNTER — Encounter: Payer: Self-pay | Admitting: Internal Medicine

## 2012-08-27 ENCOUNTER — Ambulatory Visit (INDEPENDENT_AMBULATORY_CARE_PROVIDER_SITE_OTHER): Payer: Medicaid Other | Admitting: Internal Medicine

## 2012-08-27 VITALS — BP 103/71 | HR 76 | Temp 98.2°F | Ht 65.5 in | Wt 120.8 lb

## 2012-08-27 DIAGNOSIS — R209 Unspecified disturbances of skin sensation: Secondary | ICD-10-CM | POA: Insufficient documentation

## 2012-08-27 DIAGNOSIS — R200 Anesthesia of skin: Secondary | ICD-10-CM

## 2012-08-27 DIAGNOSIS — J449 Chronic obstructive pulmonary disease, unspecified: Secondary | ICD-10-CM

## 2012-08-27 DIAGNOSIS — M47812 Spondylosis without myelopathy or radiculopathy, cervical region: Secondary | ICD-10-CM | POA: Insufficient documentation

## 2012-08-27 DIAGNOSIS — M25519 Pain in unspecified shoulder: Secondary | ICD-10-CM | POA: Insufficient documentation

## 2012-08-27 DIAGNOSIS — R202 Paresthesia of skin: Secondary | ICD-10-CM

## 2012-08-27 DIAGNOSIS — M5412 Radiculopathy, cervical region: Secondary | ICD-10-CM

## 2012-08-27 MED ORDER — GABAPENTIN 300 MG PO CAPS: 300.0000 mg | ORAL_CAPSULE | Freq: Three times a day (TID) | ORAL | Status: DC

## 2012-08-27 MED ORDER — IBUPROFEN 800 MG PO TABS: 800.0000 mg | ORAL_TABLET | Freq: Three times a day (TID) | ORAL | Status: DC | PRN

## 2012-08-27 MED ORDER — TIOTROPIUM BROMIDE MONOHYDRATE 18 MCG IN CAPS: 18.0000 ug | ORAL_CAPSULE | Freq: Every day | RESPIRATORY_TRACT | Status: DC

## 2012-08-27 NOTE — Patient Instructions (Addendum)
We will refer you to physical therapy for your neck and shoulder pain. We have increased the dosage of Gabapentin to 300 mg three times a day.  The dosage has been changed at the pharmacy.  You may take 3 of the 100 mg pills three times a day until the bottle is empty then refill for the new prescription. We have prescribed Ibuprofen 800 mg every 6-8 hours as needed for your pain. Follow-up with your Primary Doctor after you have completed Physical Therapy.

## 2012-08-27 NOTE — Assessment & Plan Note (Signed)
MRI declined by Medicaid -will refer to PT, increase Gabapentin and add Ibuprofen -if no improvement consider repeat CT cervical spine and referral to Neurosurgeon

## 2012-08-27 NOTE — Assessment & Plan Note (Signed)
Stable, refilled Spiriva

## 2012-08-27 NOTE — Progress Notes (Signed)
  Subjective:    Patient ID: Mary Jenkins, female    DOB: 11-Apr-1956, 56 y.o.   MRN: [id number]  HPI  Pt with chronic cervical degenerative changes associated with pain and tingling of right arm.  Recently evaluated in clinic with recommendation for MRI of neck which was declined per Medicaid.  She presents today with continued pain, numbness and tingling on Gabapentin 100 mg bid.  Has not had Physical Therapy previously.  Review of Systems As per HPI    Objective:   Physical Exam  Constitutional: She is oriented to person, place, and time. She appears well-developed and well-nourished. She appears distressed.  HENT:  Head: Normocephalic and atraumatic.  Neck: Neck supple. Muscular tenderness present. No spinous process tenderness present. Normal range of motion present.  Cardiovascular: Normal rate and regular rhythm.   Pulmonary/Chest: Effort normal and breath sounds normal.  Abdominal: Soft. Bowel sounds are normal.  Musculoskeletal:       Right shoulder: She exhibits decreased range of motion and pain.       Cervical back: She exhibits tenderness and pain. She exhibits normal range of motion, no bony tenderness and no swelling.  Neurological: She is alert and oriented to person, place, and time.  Skin: Skin is warm and dry.  Psychiatric: She has a normal mood and affect.          Assessment & Plan:  1. Cervical radiculopathy: 2010 CT of the cervical spine shows degenerative changes most notably C6- 7. Uncinate spurring congestion with disc height loss and facet hypertrophy narrows the right C6-7 foramen. Medicaid denied MRI. -will refer to PT -increase Gabapentin to 300 tid -ibuprofen 800 mg q8h  -if no improvement consider repeat CT and NeuroSurgery referral   2. COPD: stable -refilled Spiriva

## 2012-09-18 ENCOUNTER — Ambulatory Visit: Payer: Medicaid Other | Admitting: Rehabilitative and Restorative Service Providers"

## 2012-09-21 ENCOUNTER — Ambulatory Visit: Payer: Medicaid Other | Attending: Internal Medicine | Admitting: Physical Therapy

## 2012-09-21 DIAGNOSIS — R209 Unspecified disturbances of skin sensation: Secondary | ICD-10-CM | POA: Insufficient documentation

## 2012-09-21 DIAGNOSIS — IMO0001 Reserved for inherently not codable concepts without codable children: Secondary | ICD-10-CM | POA: Insufficient documentation

## 2012-09-21 DIAGNOSIS — M5412 Radiculopathy, cervical region: Secondary | ICD-10-CM | POA: Insufficient documentation

## 2012-09-21 DIAGNOSIS — M2569 Stiffness of other specified joint, not elsewhere classified: Secondary | ICD-10-CM | POA: Insufficient documentation

## 2012-09-29 ENCOUNTER — Ambulatory Visit: Payer: Medicaid Other | Attending: Internal Medicine | Admitting: Physical Therapy

## 2012-09-29 DIAGNOSIS — IMO0001 Reserved for inherently not codable concepts without codable children: Secondary | ICD-10-CM | POA: Insufficient documentation

## 2012-09-29 DIAGNOSIS — M5412 Radiculopathy, cervical region: Secondary | ICD-10-CM | POA: Insufficient documentation

## 2012-09-29 DIAGNOSIS — R209 Unspecified disturbances of skin sensation: Secondary | ICD-10-CM | POA: Insufficient documentation

## 2012-09-29 DIAGNOSIS — M2569 Stiffness of other specified joint, not elsewhere classified: Secondary | ICD-10-CM | POA: Insufficient documentation

## 2012-10-06 ENCOUNTER — Ambulatory Visit: Payer: Medicaid Other | Admitting: Physical Therapy

## 2012-10-06 ENCOUNTER — Telehealth: Payer: Self-pay | Admitting: *Deleted

## 2012-10-06 NOTE — Telephone Encounter (Signed)
Pt calls and states when she was at the PT today the PT noticed pt's R upper arm is swollen and she states it tingles and is painful, she has not injured the arm, nothing makes it start or stop and nothing makes it better or worse. appt 1/15 0915 dr brown/sadek

## 2012-10-07 ENCOUNTER — Encounter: Payer: Self-pay | Admitting: Internal Medicine

## 2012-10-07 ENCOUNTER — Telehealth: Payer: Self-pay | Admitting: Internal Medicine

## 2012-10-07 ENCOUNTER — Ambulatory Visit (INDEPENDENT_AMBULATORY_CARE_PROVIDER_SITE_OTHER): Payer: Medicaid Other | Admitting: Internal Medicine

## 2012-10-07 VITALS — BP 108/70 | HR 70 | Temp 97.3°F | Ht 65.5 in | Wt 124.1 lb

## 2012-10-07 DIAGNOSIS — M5412 Radiculopathy, cervical region: Secondary | ICD-10-CM

## 2012-10-07 DIAGNOSIS — D179 Benign lipomatous neoplasm, unspecified: Secondary | ICD-10-CM

## 2012-10-07 MED ORDER — IBUPROFEN 800 MG PO TABS: 800.0000 mg | ORAL_TABLET | Freq: Three times a day (TID) | ORAL | Status: DC | PRN

## 2012-10-07 NOTE — Patient Instructions (Signed)
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  This is a harmless collection of fat cells, and requires no further work-up.  We are still concerned about the pinched nerve in your neck, and we will try again to obtain an MRI for this condition.  We will contact you with an appointment.   Treatment Goals:  Goals (1 Years of Data) as of 10/07/2012    None      Progress Toward Treatment Goals:    Self Care Goals & Plans:       Care Management & Community Referrals:     Lipoma A lipoma is a noncancerous (benign) tumor composed of fat cells. They are usually found under the skin (subcutaneous). A lipoma may occur in any tissue of the body that contains fat. Common areas for lipomas to appear include the back, shoulders, buttocks, and thighs. Lipomas are a very common soft tissue growth. They are soft and grow slowly. Most problems caused by a lipoma depend on where it is growing. DIAGNOSIS  A lipoma can be diagnosed with a physical exam. These tumors rarely become cancerous, but radiographic studies can help determine this for certain. Studies used may include:  Computerized X-ray scans (CT or CAT scan).  Computerized magnetic scans (MRI). TREATMENT  Small lipomas that are not causing problems may be watched. If a lipoma continues to enlarge or causes problems, removal is often the best treatment. Lipomas can also be removed to improve appearance. Surgery is done to remove the fatty cells and the surrounding capsule. Most often, this is done with medicine that numbs the area (local anesthetic). The removed tissue is examined under a microscope to make sure it is not cancerous. Keep all follow-up appointments with your caregiver. SEEK MEDICAL CARE IF:   The lipoma becomes larger or hard.  The lipoma becomes painful, red, or increasingly swollen. These could be signs of infection or a more serious condition. Document Released: 08/30/2002  Document Revised: 12/02/2011 Document Reviewed: 02/09/2010 [facility] Patient Information 2013 [place].

## 2012-10-07 NOTE — Progress Notes (Signed)
HPI The patient is a 57 y.o. female with a history of COPD, breast cancer (diagnosed in 1997, treated with mastectomy and chemo (no rad)), presenting with R arm pain.  The patient has a history of R arm pain, present for months, thought to be due to cervical nerve impingement.  MRI has been previously ordered, and has been denied by medicaid.  She describes the pain as a 7/10, radiating from right shoulder to fingertips, maximum pain at the elbow, which comes and goes, no relation to movement.  She also notes numbness on the back of her right hand for this same time period.  Now she notes a new "knot" in her right arm, first noticed yesterday at physical therapy.    ROS: General: no fevers, chills, changes in weight, changes in appetite Skin: no rash HEENT: no blurry vision, hearing changes, sore throat Pulm: no dyspnea, coughing, wheezing CV: no chest pain, palpitations, shortness of breath Abd: no abdominal pain, nausea/vomiting, diarrhea/constipation GU: no dysuria, hematuria, polyuria Ext: no arthralgias, myalgias Neuro: see HPI  Filed Vitals:   10/07/12 0933  BP: 108/70  Pulse: 70  Temp: 97.3 F (36.3 C)    PEX General: alert, cooperative, and in no apparent distress HEENT: pupils equal round and reactive to light, vision grossly intact, oropharynx clear and non-erythematous  Neck: supple, no lymphadenopathy Lungs: clear to ascultation bilaterally, normal work of respiration, no wheezes, rales, ronchi Heart: regular rate and rhythm, no murmurs, gallops, or rubs Abdomen: soft, non-tender, non-distended, normal bowel sounds Extremities: Right dorsal upper arm with soft mass proximal to olecranon process (c/w lipoma) Neurologic: alert & oriented X3, cranial nerves II-XII intact, strength 4/5 throughout right arm, otherwise 5/5 throughout other 3 extremities, sensation somewhat subjectively decreased to pinprick in right upper extremity diffusely to the level of the  shoulder  Current Outpatient Prescriptions on File Prior to Visit  Medication Sig Dispense Refill  . albuterol (PROVENTIL HFA;VENTOLIN HFA) 108 (90 BASE) MCG/ACT inhaler Inhale 2 puffs into the lungs every 6 (six) hours as needed for wheezing.  1 Inhaler  0  . albuterol (PROVENTIL) (2.5 MG/3ML) 0.083% nebulizer solution Take 3 mLs (2.5 mg total) by nebulization every 6 (six) hours as needed. For shortness of breath.  75 mL  1  . budesonide-formoterol (SYMBICORT) 160-4.5 MCG/ACT inhaler Take 2 puffs first thing in am and then another 2 puffs about 12 hours later.  1 Inhaler  12  . famotidine (PEPCID) 20 MG tablet One at bedtime  30 tablet  11  . gabapentin (NEURONTIN) 300 MG capsule Take 1 capsule (300 mg total) by mouth 3 (three) times daily.  90 capsule  3  . ibuprofen (ADVIL,MOTRIN) 800 MG tablet Take 1 tablet (800 mg total) by mouth every 8 (eight) hours as needed for pain.  30 tablet  0  . pantoprazole (PROTONIX) 40 MG tablet Take 1 tablet (40 mg total) by mouth daily.  30 tablet  11  . tiotropium (SPIRIVA) 18 MCG inhalation capsule Place 1 capsule (18 mcg total) into inhaler and inhale daily.  30 capsule  6    Assessment/Plan

## 2012-10-07 NOTE — Telephone Encounter (Signed)
I advised pt Dr. Michail Sermon sent in RX 08/27/12 x 6 refills. I also confirmed this with [facility] they did have this. Nothing further was needed

## 2012-10-07 NOTE — Assessment & Plan Note (Signed)
Case discussed with Dr. Stann Mainland.  The patient notes a several-month history of right arm pain, consistent with cervical radiculopathy.  Given her history of breast cancer (albeit distant), new cervical radiculopathy raises some concern for new bony metastasis to cervical spine, which could only be adequately evaluated by MRI.  MRI has been declined once by medicaid, but we will try to obtain prior authorization again, given possibility of breast cancer metastatsis. -re-ordered MRI cervical spine

## 2012-10-07 NOTE — Assessment & Plan Note (Addendum)
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  DVT not likely (no edema distal to lesion, and no pain on palpation of lesion) -no treatment needed at this time.

## 2012-10-13 ENCOUNTER — Ambulatory Visit: Payer: Medicaid Other | Admitting: Physical Therapy

## 2012-10-27 ENCOUNTER — Telehealth: Payer: Self-pay | Admitting: *Deleted

## 2012-10-27 NOTE — Telephone Encounter (Signed)
Pt calls and states pain is worse, ibuprofen not helping, has done therapy as instructed and not progressing, pain is spreading, appt 2/5 at 0945 dr Newt Lukes

## 2012-10-28 ENCOUNTER — Ambulatory Visit (INDEPENDENT_AMBULATORY_CARE_PROVIDER_SITE_OTHER): Payer: Medicaid Other | Admitting: Internal Medicine

## 2012-10-28 ENCOUNTER — Encounter: Payer: Self-pay | Admitting: Internal Medicine

## 2012-10-28 VITALS — BP 124/64 | HR 85 | Temp 97.6°F | Resp 20 | Ht 65.0 in | Wt 124.0 lb

## 2012-10-28 DIAGNOSIS — R200 Anesthesia of skin: Secondary | ICD-10-CM

## 2012-10-28 DIAGNOSIS — M5412 Radiculopathy, cervical region: Secondary | ICD-10-CM

## 2012-10-28 DIAGNOSIS — K219 Gastro-esophageal reflux disease without esophagitis: Secondary | ICD-10-CM

## 2012-10-28 LAB — COMPREHENSIVE METABOLIC PANEL
Albumin: 4 g/dL (ref 3.5–5.2)
Alkaline Phosphatase: 77 U/L (ref 39–117)
BUN: 15 mg/dL (ref 6–23)
Glucose, Bld: 85 mg/dL (ref 70–99)
Total Bilirubin: 0.4 mg/dL (ref 0.3–1.2)

## 2012-10-28 MED ORDER — TRAMADOL HCL 50 MG PO TABS: 50.0000 mg | ORAL_TABLET | Freq: Two times a day (BID) | ORAL | Status: DC

## 2012-10-28 MED ORDER — GABAPENTIN 400 MG PO CAPS: 400.0000 mg | ORAL_CAPSULE | Freq: Three times a day (TID) | ORAL | Status: DC

## 2012-10-28 NOTE — Assessment & Plan Note (Signed)
Patient continues to have significant cervical radiculopathy. MRI cervical spine was ordered twice but was rejected by insurance. Will try to get the MRI to further evaluate her cervical spine. At this point I will increase her Neurontin dosage to 400 mg 3 times a day as well and starting hormone tramadol 50 mg twice a day. I will reevaluate her in 3 weeks.

## 2012-10-28 NOTE — Progress Notes (Signed)
Subjective:   Patient ID: Mary Jenkins female   DOB: January 13, 1956 57 y.o.   MRN: [id number]  HPI: [name] is a 57 y.o. female with past history significant as outlined below who presented to the clinic with persistent neck and shoulder pain. She reports that her pain has migrated now to her left  Shoulder as well as back. She further notice significant numbness and tingling in both her arms worse in her right. She's not able to sleep well do to numbness and tingling. She denies any weakness. She continues to do physical therapy without any significant improvement. She is currently taking 800 mg ibuprofen every 6 hours without any change of her symptoms.  Past Medical History  Diagnosis Date  . Breast cancer   . History of right mastectomy   . Personal history of chemotherapy   . COPD (chronic obstructive pulmonary disease) 06/24/2011  . Liver cyst 06/24/2011  . Smoking addiction 06/24/2011  . Insomnia 06/24/2011  . Shortness of breath   . Anemia   . GERD (gastroesophageal reflux disease) 02/03/2012   Current Outpatient Prescriptions  Medication Sig Dispense Refill  . albuterol (PROVENTIL HFA;VENTOLIN HFA) 108 (90 BASE) MCG/ACT inhaler Inhale 2 puffs into the lungs every 6 (six) hours as needed for wheezing.  1 Inhaler  0  . albuterol (PROVENTIL) (2.5 MG/3ML) 0.083% nebulizer solution Take 3 mLs (2.5 mg total) by nebulization every 6 (six) hours as needed. For shortness of breath.  75 mL  1  . budesonide-formoterol (SYMBICORT) 160-4.5 MCG/ACT inhaler Take 2 puffs first thing in am and then another 2 puffs about 12 hours later.  1 Inhaler  12  . famotidine (PEPCID) 20 MG tablet One at bedtime  30 tablet  11  . gabapentin (NEURONTIN) 300 MG capsule Take 1 capsule (300 mg total) by mouth 3 (three) times daily.  90 capsule  3  . ibuprofen (ADVIL,MOTRIN) 800 MG tablet Take 1 tablet (800 mg total) by mouth every 8 (eight) hours as needed for pain.  60 tablet  2  . pantoprazole (PROTONIX) 40  MG tablet Take 1 tablet (40 mg total) by mouth daily.  30 tablet  11  . tiotropium (SPIRIVA) 18 MCG inhalation capsule Place 1 capsule (18 mcg total) into inhaler and inhale daily.  30 capsule  6   Family History  Problem Relation Age of Onset  . Hypertension Sister   . Colon cancer Neg Hx   . Esophageal cancer Neg Hx   . Stomach cancer Neg Hx   . Heart disease Brother   . Heart disease Brother   . Heart disease Brother    History   Social History  . Marital Status: Single    Spouse Name: N/A    Number of Children: N/A  . Years of Education: N/A   Social History Main Topics  . Smoking status: Former Smoker -- 2.5 packs/day for 41 years    Types: Cigarettes    Quit date: 07/24/2011  . Smokeless tobacco: Never Used  . Alcohol Use: No  . Drug Use: No     Comment: In the past cocaine abuse. Quit 4 years ago.   Marland Kitchen Sexually Active: No   Other Topics Concern  . None   Social History Narrative  . None   Review of Systems: Constitutional: Denies fever, chills, diaphoresis, appetite change and fatigue.  HEENT: Denies trouble swallowing but noted neck pain, neck stiffness    Respiratory: Denies SOB, DOE, cough, chest tightness,  and wheezing.   Cardiovascular: Denies chest pain, palpitations and leg swelling.  Gastrointestinal: Denies nausea, vomiting, abdominal pain, diarrhea, constipation, Musculoskeletal: Noted  myalgias, back pain but denies joint swelling, arthralgias and gait problem.   Neurological: Denies dizziness, seizures, syncope, weakness, light-headedness and headaches but  numbness    Objective:  Physical Exam: Filed Vitals:   10/28/12 0952  BP: 124/64  Pulse: 85  Temp: 97.6 F (36.4 C)  TempSrc: Oral  Resp: 20  Height: _0  (1.651 m)  Weight: 124 lb (56.246 kg)  SpO2: 96%   Constitutional: Vital signs reviewed.  Patient is a well-developed and well-nourished female in no acute distress and cooperative with exam. Alert and oriented x3.  Head:  Normocephalic and atraumatic  Neck: Supple, Trachea midline normal ROM, No JVD, mass, thyromegaly, or carotid bruit present.  Cardiovascular: RRR, S1 normal, S2 normal, no MRG, pulses symmetric and intact bilaterally Pulmonary/Chest: CTAB, no wheezes, rales, or rhonchi Abdominal: Soft. Non-tender, non-distended, bowel sounds are normal,  GU: no CVA tenderness Musculoskeletal: No joint deformities, erythema, or stiffness, ROM full and no nontender.  Neurological: A&O x3, Strength is normal and symmetric bilaterally, cranial nerve II-XII are grossly intact, no focal motor deficit, decreased sensation of left arm starting from cervical spine down to her hand compared to the left arm otherwise sensory intact to light touch of lower extremity bilateral. Patient reports about numbness as soon as I am examining her cervical spine. Skin: Warm, dry and intact. No rash, cyanosis, or clubbing.

## 2012-11-03 ENCOUNTER — Emergency Department (HOSPITAL_COMMUNITY): Payer: Medicaid Other

## 2012-11-03 ENCOUNTER — Emergency Department (HOSPITAL_COMMUNITY)
Admission: EM | Admit: 2012-11-03 | Discharge: 2012-11-03 | Disposition: A | Discharge status: Home / Self Care | Payer: Medicaid Other | Attending: Emergency Medicine | Admitting: Emergency Medicine

## 2012-11-03 ENCOUNTER — Encounter (HOSPITAL_COMMUNITY): Payer: Self-pay | Admitting: *Deleted

## 2012-11-03 DIAGNOSIS — Z8719 Personal history of other diseases of the digestive system: Secondary | ICD-10-CM | POA: Insufficient documentation

## 2012-11-03 DIAGNOSIS — Z79899 Other long term (current) drug therapy: Secondary | ICD-10-CM | POA: Insufficient documentation

## 2012-11-03 DIAGNOSIS — J441 Chronic obstructive pulmonary disease with (acute) exacerbation: Secondary | ICD-10-CM | POA: Insufficient documentation

## 2012-11-03 DIAGNOSIS — R079 Chest pain, unspecified: Secondary | ICD-10-CM | POA: Insufficient documentation

## 2012-11-03 DIAGNOSIS — Z9221 Personal history of antineoplastic chemotherapy: Secondary | ICD-10-CM | POA: Insufficient documentation

## 2012-11-03 DIAGNOSIS — Z853 Personal history of malignant neoplasm of breast: Secondary | ICD-10-CM | POA: Insufficient documentation

## 2012-11-03 DIAGNOSIS — K219 Gastro-esophageal reflux disease without esophagitis: Secondary | ICD-10-CM | POA: Insufficient documentation

## 2012-11-03 DIAGNOSIS — Z862 Personal history of diseases of the blood and blood-forming organs and certain disorders involving the immune mechanism: Secondary | ICD-10-CM | POA: Insufficient documentation

## 2012-11-03 DIAGNOSIS — Z87891 Personal history of nicotine dependence: Secondary | ICD-10-CM | POA: Insufficient documentation

## 2012-11-03 LAB — CBC WITH DIFFERENTIAL/PLATELET
Eosinophils Absolute: 0.1 10*3/uL (ref 0.0–0.7)
Hemoglobin: 13 g/dL (ref 12.0–15.0)
Lymphs Abs: 1.9 10*3/uL (ref 0.7–4.0)
MCH: 25.5 pg — ABNORMAL LOW (ref 26.0–34.0)
MCV: 79.4 fL (ref 78.0–100.0)
Monocytes Relative: 7 % (ref 3–12)
Neutrophils Relative %: 62 % (ref 43–77)
RBC: 5.09 MIL/uL (ref 3.87–5.11)

## 2012-11-03 LAB — BASIC METABOLIC PANEL
BUN: 12 mg/dL (ref 6–23)
CO2: 26 mEq/L (ref 19–32)
GFR calc non Af Amer: 60 mL/min — ABNORMAL LOW (ref >90)
Glucose, Bld: 79 mg/dL (ref 70–99)
Potassium: 3.9 mEq/L (ref 3.5–5.1)

## 2012-11-03 MED ORDER — IPRATROPIUM BROMIDE 0.02 % IN SOLN
0.5000 mg | Freq: Once | RESPIRATORY_TRACT | Status: AC
  Administered 2012-11-03: 0.5 mg via RESPIRATORY_TRACT
  Filled 2012-11-03: qty 2.5

## 2012-11-03 MED ORDER — ALBUTEROL (5 MG/ML) CONTINUOUS INHALATION SOLN
INHALATION_SOLUTION | RESPIRATORY_TRACT | Status: AC
  Administered 2012-11-03: 15 mg via RESPIRATORY_TRACT
  Filled 2012-11-03: qty 20

## 2012-11-03 MED ORDER — METHYLPREDNISOLONE SODIUM SUCC 125 MG IJ SOLR
125.0000 mg | Freq: Once | INTRAMUSCULAR | Status: AC
  Administered 2012-11-03: 125 mg via INTRAVENOUS
  Filled 2012-11-03: qty 2

## 2012-11-03 MED ORDER — PREDNISONE 10 MG PO TABS: 60.0000 mg | ORAL_TABLET | Freq: Every day | ORAL | Status: DC

## 2012-11-03 MED ORDER — ALBUTEROL SULFATE (5 MG/ML) 0.5% IN NEBU
15.0000 mg | INHALATION_SOLUTION | Freq: Once | RESPIRATORY_TRACT | Status: AC
  Administered 2012-11-03: 15 mg via RESPIRATORY_TRACT

## 2012-11-03 NOTE — ED Notes (Addendum)
Pt with hx of copd, NOT asthma, to ED C/O SOB.  Pt given 1 breathing tx of 5 albut/0.5 atrovent with some relief.  Sats of 99., however, lung sounds remain very tight and pt has very labored breathing.  Pt c/o L chest pain on inspiration since this am that is increasing.

## 2012-11-03 NOTE — ED Notes (Signed)
Patient given copy of discharge paperwork; went over discharge instructions with patient and family.  Patient instructed to take Prednisone as directed, to follow up with primary care physician, and to return to the ED for new, worsening, or concerning symptoms.

## 2012-11-03 NOTE — ED Notes (Signed)
Patient informed that troponin results are back and that EDP will be in to talk about them; EDP notified that results are back.  Patient ambulated to restroom with no difficulty; will continue to monitor.

## 2012-11-03 NOTE — ED Provider Notes (Signed)
History     CSN: [id number]  Arrival date & time 11/03/12  1545   First MD Initiated Contact with Patient 11/03/12 1629      Chief Complaint  Patient presents with  . Shortness of Breath    (Consider location/radiation/quality/duration/timing/severity/associated sxs/prior treatment) HPI Comments: 57 y/o F p/w SOB and CP. Onset this AM while cleaning. Intermittently improved with inhalers at home. Feels similar to prior COPD exacerbations. Previously feeling well. CP primarily left sided. Is worse with inspiration. Not exertional. No fevers or cough.   Patient is a 57 y.o. female presenting with shortness of breath. The history is provided by the patient.  Shortness of Breath Severity:  Severe Onset quality:  Gradual Timing:  Constant Progression:  Worsening Chronicity: h/o COPD. Relieved by:  Inhaler Worsened by:  Activity Associated symptoms: chest pain (left sided primarily) and wheezing   Associated symptoms: no abdominal pain, no cough, no fever, no headaches, no rash and no vomiting     Past Medical History  Diagnosis Date  . History of right mastectomy   . Personal history of chemotherapy   . COPD (chronic obstructive pulmonary disease) 06/24/2011  . Liver cyst 06/24/2011  . Smoking addiction 06/24/2011  . Insomnia 06/24/2011  . Shortness of breath   . Anemia   . GERD (gastroesophageal reflux disease) 02/03/2012  . Breast cancer     Past Surgical History  Procedure Laterality Date  . Mastectomy, partial  1997    right   . Tubal ligation      Family History  Problem Relation Age of Onset  . Hypertension Sister   . Colon cancer Neg Hx   . Esophageal cancer Neg Hx   . Stomach cancer Neg Hx   . Heart disease Brother   . Heart disease Brother   . Heart disease Brother     History  Substance Use Topics  . Smoking status: Former Smoker -- 2.50 packs/day for 41 years    Types: Cigarettes    Quit date: 07/23/2012  . Smokeless tobacco: Never Used  . Alcohol  Use: No    OB History   Grav Para Term Preterm Abortions TAB SAB Ect Mult Living                  Review of Systems  Constitutional: Negative for fever and chills.  HENT: Negative for congestion and rhinorrhea.   Respiratory: Positive for chest tightness, shortness of breath and wheezing. Negative for cough.   Cardiovascular: Positive for chest pain (left sided primarily). Negative for leg swelling.  Gastrointestinal: Negative for nausea, vomiting, abdominal pain and diarrhea.  Genitourinary: Negative for dysuria, hematuria, flank pain and difficulty urinating.  Musculoskeletal: Negative for back pain.  Skin: Negative for rash.  Neurological: Negative for dizziness and headaches.  All other systems reviewed and are negative.    Allergies  Aspirin  Home Medications   Current Outpatient Rx  Name  Route  Sig  Dispense  Refill  . albuterol (PROVENTIL HFA;VENTOLIN HFA) 108 (90 BASE) MCG/ACT inhaler   Inhalation   Inhale 2 puffs into the lungs every 6 (six) hours as needed for wheezing.   1 Inhaler   0   . budesonide-formoterol (SYMBICORT) 160-4.5 MCG/ACT inhaler      Take 2 puffs first thing in am and then another 2 puffs about 12 hours later.   1 Inhaler   12   . gabapentin (NEURONTIN) 400 MG capsule   Oral   Take 1 capsule (400  mg total) by mouth 3 (three) times daily.   90 capsule   2   . pantoprazole (PROTONIX) 40 MG tablet   Oral   Take 1 tablet (40 mg total) by mouth daily.   30 tablet   11   . tiotropium (SPIRIVA) 18 MCG inhalation capsule   Inhalation   Place 1 capsule (18 mcg total) into inhaler and inhale daily.   30 capsule   6   . traMADol (ULTRAM) 50 MG tablet   Oral   Take 1 tablet (50 mg total) by mouth 2 (two) times daily.   60 tablet   0   . predniSONE (DELTASONE) 10 MG tablet   Oral   Take 6 tablets (60 mg total) by mouth daily.   30 tablet   0     BP 109/69  Pulse 84  Temp(Src) 98.1 F (36.7 C) (Oral)  Resp 17  SpO2  96%  Physical Exam  Nursing note and vitals reviewed. Constitutional: She is oriented to person, place, and time. She appears well-developed and well-nourished. No distress.  HENT:  Head: Normocephalic and atraumatic.  Eyes: Conjunctivae are normal. Right eye exhibits no discharge. Left eye exhibits no discharge.  Neck: No tracheal deviation present.  Cardiovascular: Normal heart sounds and intact distal pulses.   Pulmonary/Chest: No stridor. She has no wheezes. She has no rales.  Tachypnea. Diffusely diminished breath sounds.  Abdominal: Soft. She exhibits no distension. There is no tenderness. There is no guarding.  Musculoskeletal: She exhibits no edema and no tenderness.  Neurological: She is alert and oriented to person, place, and time.  Skin: Skin is warm and dry.  Psychiatric: She has a normal mood and affect. Her behavior is normal.    ED Course  Procedures (including critical care time)  Labs Reviewed  CBC WITH DIFFERENTIAL - Abnormal; Notable for the following:    MCH 25.5 (*)    RDW 15.9 (*)    All other components within normal limits  BASIC METABOLIC PANEL - Abnormal; Notable for the following:    GFR calc non Af Amer 60 (*)    GFR calc Af Amer 69 (*)    All other components within normal limits  TROPONIN I  D-DIMER, QUANTITATIVE  TROPONIN I   Dg Chest Portable 1 View  11/03/2012  *RADIOLOGY REPORT*  Clinical Data: Shortness of breath  PORTABLE CHEST - 1 VIEW  Comparison: 01/01/2012  Findings: Cardiomediastinal silhouette is stable.  Stable surgical clips in the right axilla.  No acute infiltrate or pleural effusion.  No pulmonary edema.  Hyperinflation again noted.  IMPRESSION: No active disease.  Hyperinflation again noted.   Original Report Authenticated By: Lahoma Crocker, M.D.      1. COPD exacerbation      Date: 11/03/2012  Rate: 79  Rhythm: normal sinus rhythm  QRS Axis: normal  Intervals: normal  ST/T Wave abnormalities: nonspecific T wave changes   Conduction Disutrbances:none  Narrative Interpretation:   Old EKG Reviewed: Unchanged    MDM    57 y/o F h/o COPD p/w SOB and CP. Similar to prior COPD exacerbations. Improved with beta agonists at home.  Given solumedrol and continuous neb treatment. Resolution of symptoms after treatment.  Likely COPD/Asthma exacerbation. Given course of prednisone for next 5 days. Unlikely PNA as CXR neg, no leukocytosis, no fever  Unlikely CHF exacerbation as no history. Not c/w history or physical Unlikely ACS as troponin neg x2, EKG as above, low risk per [facility]  Unlikely PE as atypical presentation, low risk per [facility]. Dimer negative Doubt Aortic Dissection, Pancreatitis, Pneumothorax, Arrhythmia, Endo/Myo/Pericarditis, Esophageal pathology, or other Emergent pathology.   O2 saturations high 90's on room air   Dc home. Return precautions given. Follow up with primary care provider. Patient in agreement with plan  Labs and imaging reviewed by myself and considered in medical decision making if ordered. Imaging interpreted by radiology.   Discussed case with Dr. Darl Householder who is in agreement with assessment and plan.            Bonnita Hollow, MD 11/04/12 [phone]

## 2012-11-03 NOTE — ED Provider Notes (Signed)
I have supervised the resident on the management of this patient and agree with the note above. I personally interviewed and examined the patient and my addendum is below.   Mary Jenkins is a 57 y.o. female hx of COPD here with chest pain and SOB. Chest pain this AM then worsening SOB. Not improving with nebs at home. Not hypoxic on exam but tripoding and tachypneic. Dec breath sounds throughout. Will give nebs, steroids, and check EKG and trop. Will likely need admission.    Wandra Arthurs, MD 11/03/12 1640

## 2012-11-03 NOTE — ED Notes (Signed)
Patient currently resting quietly in bed; no respiratory or acute distress noted.  Patient updated on plan of care; informed patient that we are currently waiting on further orders/disposition from EDP.  Patient denies any need at this time; family present at bedside.  Will continue to monitor.

## 2012-11-03 NOTE — ED Notes (Signed)
C/o onset non radiating left side CP underneath left breast since 0800 with SOB. Reports pain as sharp in nature which worsens with deep breaths. Area is tender to palpation. Denies fever, cold, cough, chills, diaphoresis, n/v. Also c/o LUE pain, numbness & tingling intermittently x 2 weeks.

## 2012-11-03 NOTE — ED Notes (Signed)
Patient currently sitting up in bed; no respiratory or acute distress noted.  Patient updated on plan of care; informed patient that we are currently waiting for troponin results to come back; patient verbalized understanding.  Patient denies any other needs at this time; will continue to monitor.

## 2012-11-04 NOTE — ED Provider Notes (Signed)
I have supervised the resident on the management of this patient and agree with the note above. I personally interviewed and examined the patient and my addendum is below.   See my separate note. Patient here with COPD exacerbation. Sent home on steroids, albuterol.   Wandra Arthurs, MD 11/04/12 2212

## 2012-11-10 ENCOUNTER — Telehealth: Payer: Self-pay | Admitting: Internal Medicine

## 2012-11-10 MED ORDER — BUDESONIDE-FORMOTEROL FUMARATE 160-4.5 MCG/ACT IN AERO: INHALATION_SPRAY | RESPIRATORY_TRACT | Status: DC

## 2012-11-10 NOTE — Telephone Encounter (Signed)
Rx has been sent in, pt is aware.

## 2012-11-18 ENCOUNTER — Ambulatory Visit (INDEPENDENT_AMBULATORY_CARE_PROVIDER_SITE_OTHER): Payer: Medicaid Other | Admitting: Internal Medicine

## 2012-11-18 ENCOUNTER — Encounter: Payer: Self-pay | Admitting: Internal Medicine

## 2012-11-18 VITALS — BP 108/73 | HR 75 | Temp 96.9°F | Ht 65.5 in | Wt 129.2 lb

## 2012-11-18 DIAGNOSIS — J449 Chronic obstructive pulmonary disease, unspecified: Secondary | ICD-10-CM

## 2012-11-18 DIAGNOSIS — M5412 Radiculopathy, cervical region: Secondary | ICD-10-CM

## 2012-11-18 NOTE — Progress Notes (Signed)
Subjective:   Patient ID: Mary Jenkins female   DOB: 1956-08-12 57 y.o.   MRN: [id number]  HPI: [name] is a 57 y.o. female with past medical history significant as outlined below who presented to the clinic for an ED visit which was evaluated for COPD exacerbation ( was given Solumedrol and nebs). Patient reports on the day of the ED visit she fell softly short of breath. She did not try to use any of her albuterol inhaler or nebulizer. Trigger fracture may have been smoking exposure. She reported that her nephew slept that night in her apartment in smoke in the bathroom. Since that morning when she inhaled to smoke she was not feeling right. She noticed that whenever she is around people who smoke she feels more short of breath.  Today she feels almost back to normal. Patient completed prednisone taper dose. Noted just mild shortness of breath. She had been not using her rescue inhaler recently. Demonstrated  appropriate  Albuterol inhaler use. Denied any fevers or chills, nasal congestion, runny nose, cough, sick contacts recent travel.    Past Medical History  Diagnosis Date  . History of right mastectomy   . Personal history of chemotherapy   . COPD (chronic obstructive pulmonary disease) 06/24/2011  . Smoking addiction 06/24/2011    Quit 06/2012  . Insomnia 06/24/2011  . Anemia   . GERD (gastroesophageal reflux disease) 02/03/2012  . Breast cancer   . 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  Separate hypoattenuating lesions within the liver, favored to represent small cysts or complex cyst. Too small to entirely characterize.   CT abdomen/pelvis 11/2011. 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 2. Also  unchanged is a 7 mm low attenuation lesion in segment 8 of  the liver, which is too small to characterize, but is favored to represent a small cyst or biliary hamartoma. 3. Small amount of pneumobilia in the left lobe of the liver, as above, new compared to the prior examination 06/14/2011. This is of uncertain etiology and significance, but is a common finding in patients who have had prior sphincterotomy (clinical correlation is recommended).      Current Outpatient Prescriptions  Medication Sig Dispense Refill  . albuterol (PROVENTIL) (2.5 MG/3ML) 0.083% nebulizer solution Take 2.5 mg by nebulization every 6 (six) hours as needed for wheezing.      Marland Kitchen albuterol (PROVENTIL HFA;VENTOLIN HFA) 108 (90 BASE) MCG/ACT inhaler Inhale 2 puffs into the lungs every 6 (six) hours as needed for wheezing.  1 Inhaler  0  . budesonide-formoterol (SYMBICORT) 160-4.5 MCG/ACT inhaler Take 2 puffs first thing in am and then another 2 puffs about 12 hours later.  1 Inhaler  5  . gabapentin (NEURONTIN) 400 MG capsule Take 1 capsule (400 mg total) by mouth 3 (three) times daily.  90 capsule  2  . pantoprazole (PROTONIX) 40 MG tablet Take 1 tablet (40 mg total) by mouth daily.  30 tablet  11  . tiotropium (SPIRIVA) 18 MCG inhalation capsule Place 1 capsule (18 mcg total) into inhaler and inhale daily.  30 capsule  6  . traMADol (ULTRAM) 50 MG tablet Take 1 tablet (50 mg total) by mouth 2 (two)  times daily.  60 tablet  0   No current facility-administered medications for this visit.   Family History  Problem Relation Age of Onset  . Hypertension Sister   . Colon cancer Neg Hx   . Esophageal cancer Neg Hx   . Stomach cancer Neg Hx   . Heart disease Brother   . Heart disease Brother   . Heart disease Brother    History   Social History  . Marital Status: Single    Spouse Name: N/A    Number of Children: N/A  . Years of Education: N/A   Social History Main Topics  . Smoking status: Former Smoker -- 2.50  packs/day for 41 years    Types: Cigarettes    Quit date: 07/23/2012  . Smokeless tobacco: Never Used  . Alcohol Use: No  . Drug Use: No     Comment: In the past cocaine abuse. Quit 4 years ago.   Marland Kitchen Sexually Active: None   Other Topics Concern  . None   Social History Narrative  . None   Review of Systems: Constitutional: Denies fever, chills, diaphoresis, appetite change and fatigue.  HEENT: Denies  congestion, sore throat, rhinorrhea,mouth sores, trouble swallowing   Respiratory: Noted mild SOB, DOE but denies cough, chest tightness,  and wheezing.   Cardiovascular: Denies chest pain, palpitations and leg swelling.  Gastrointestinal: Denies nausea, vomiting, abdominal pain, diarrhea, constipation, blood in stool and abdominal distention.  Neurological: Denies dizziness  Objective:  Physical Exam: Filed Vitals:   11/18/12 0946  BP: 108/73  Pulse: 75  Temp: 96.9 F (36.1 C)  TempSrc: Oral  Height: 5' 5.5" (1.664 m)  Weight: 129 lb 3.2 oz (58.605 kg)  SpO2: 96%   Constitutional: Vital signs reviewed.  Patient is a well-developed and well-nourished female in no acute distress and cooperative with exam. Alert and oriented x3.  Mouth: no erythema or exudates, MMM.  Neck: Supple, Cardiovascular: RRR, S1 normal, S2 normal, no MRG, pulses symmetric and intact bilaterally Pulmonary/Chest: CTAB, no wheezes, rales, or rhonchi Abdominal: Soft. Musculoskeletal: No joint deformities, erythema, or stiffness, ROM full and no nontender Hematology: no cervical adenopathy.  Neurological: A&O x3,

## 2012-11-18 NOTE — Assessment & Plan Note (Signed)
Patient completed prednisone taper dose after COPD exacerbation. Patient otherwise has been doing well with only one ED visit since her last six-month period.  Recommended to continue Spiriva and Symbicort on a daily basis. Discussed that albuterol nebulizer and inhalers are only rescue medication and should not be used as a  daily medication. I advised her if she is using these  rescue medication on a daily basis she needs to be evaluated in the clinic. I will further refer patient back to Dr. Melvyn Novas ( Pulmonology)who recommended to followup in October.

## 2012-11-18 NOTE — Patient Instructions (Signed)
1. Take your Symbicort and Spiriva every day as prescribed 2. Take albuterol nebulizer or inhaler only as a rescue medication.

## 2012-12-02 ENCOUNTER — Encounter: Payer: Self-pay | Admitting: Internal Medicine

## 2012-12-02 ENCOUNTER — Ambulatory Visit (INDEPENDENT_AMBULATORY_CARE_PROVIDER_SITE_OTHER): Payer: Medicaid Other | Admitting: Internal Medicine

## 2012-12-02 VITALS — BP 102/72 | HR 83 | Temp 97.9°F | Ht 65.5 in | Wt 131.0 lb

## 2012-12-02 DIAGNOSIS — J441 Chronic obstructive pulmonary disease with (acute) exacerbation: Secondary | ICD-10-CM

## 2012-12-02 NOTE — Progress Notes (Signed)
Subjective:    Patient ID: Mary Jenkins, female    DOB: 1956-04-25  MRN: [id number]  HPI  34 yobm quit smoking  07/24/11 not taking breathing medications at that point and subsequently placed on multiple meds and still sob just getting dressed so referred from the medicine clinic at [facility] 02/13/2012 for pulmonary eval.  02/13/2012 1st pulmonary cc progressive worse x 6 months doe x 50-100 ft can't do a grocery store where could do before quit. No real variability, cough is better with no excess or purulent sputum.  Not using saba daytime because finds if she's holding still doesn't need it as oftern. Some better p last  Prednisone rx. Not much variability. rec Stop atrovent and advair Start symbiocort Take 2 puffs first thing in am and then another 2 puffs about 12 hours later.  Take after only am dose Spiriva Protonix 40 mg Take 30-60 min before first meal of the day  GERD diet Only use your albuterol (Plan B= ventolin puffer,  Plan C is nebulizer) as a rescue medication to be used if you can't catch your breath by resting or doing a relaxed purse lip breathing pattern. The less you use it, the better it will work when you need it. Ok to use rescue up to every 4 hours if doing poorly.     03/13/2012 f/u ov/Wert cc breathing better but cough worse x 1-2 weeks esp when lie down and since ran out of ppi (didn't know she was supposed to refill despite above instructions). Min thick white mucus, then sleeps ok s exacerbation in am.  Denies variability or need for more than occ daytime saba. No neb use at all  rec Work on inhaler technique:   Start back on protonix 40 mg Take 30-60 min before first meal of the day and Pepcid 20 mg one at bedtime GERD diet  04/22/2012 f/u ov/Wert cc breathing better, dry cough worse x this week attributed to dietary discretion assoc with overt HB , maintaining on symbicort 160 2bid and spiriva. Really no limitation.  No purulent sputum or sinus symptoms rec Work on  inhaler technique: .    Best cough medication is delsym cough syrup       12/02/2012 f/u ov/Wert cc back to baseline doe = gets dropped off at door, does shopping leaning on a buggy, and housework slow, can't vacuum - one day after clearing table could not recover breathing at rest > ER  11/03/12- had episode where she was very SOB, was txed with steroids and neb txs. Breathing has improved since then.    Normally rarely use saba but needed during event and did not use Plan C as per instructions "you told me to stop it" (see 02/13/12 printed instructions to pt)  No obvious daytime variabilty or assoc chronic cough or cp or chest tightness, subjective wheeze overt sinus or hb symptoms. No unusual exp hx or h/o childhood pna/ asthma or premature birth to his knowledge.      Sleeping ok without nocturnal  or early am exacerbation  of respiratory  c/o's or need for noct saba. Also denies any obvious fluctuation of symptoms with weather or environmental changes or other aggravating or alleviating factors except as outlined above.  ROS  At present no c/o any significant sore throat, dysphagia, dental problems, itching, sneezing,  nasal congestion or excess/ purulent secretions, ear ache,   fever, chills, sweats, unintended wt loss, pleuritic or exertional cp, hemoptysis, palpitations, orthopnea pnd or  leg swelling.  Also denies presyncope, palpitations, heartburn, abdominal pain, anorexia, nausea, vomiting, diarrhea  or change in bowel or urinary habits, change in stools or urine, dysuria,hematuria,  rash, arthralgias, visual complaints, headache, numbness weakness or ataxia or problems with walking or coordination. No noted change in mood/affect or memory.             Objective:   Physical Exam amb thin wf nad Wt 120 02/13/2012  > 119 03/13/2012 > 04/22/2012 117 > 07/21/2012 122> 12/02/2012  131 HEENT mild turbinate edema.  Oropharynx no thrush or excess pnd or cobblestoning.  No JVD or cervical  adenopathy. Mild accessory muscle hypertrophy. Trachea midline, nl thryroid. Chest was hyperinflated by percussion with diminished breath sounds and moderate increased exp time without wheeze. Hoover sign positive at mid inspiration. Regular rate and rhythm without murmur gallop or rub or increase P2 or edema.  Abd: no hsm, nl excursion. Ext warm without cyanosis or clubbing.    2//11/14 pcxr No active disease, hyperinflation noted     Assessment & Plan:

## 2012-12-02 NOTE — Patient Instructions (Addendum)
Please schedule a follow up visit in 3 months but call sooner if needed with pft's on return.   Plan A = automatic symbicort and spiriva   Plan B = Backup, proventil  Plan C = nebulizer, only use if can't relief sitting still and used plan B and still can't recover  Plan D= Doctor  Plan E = Er  Please schedule a follow up visit in 3 months but call sooner if needed with pft's

## 2012-12-05 NOTE — Assessment & Plan Note (Signed)
-  FEV1 0.39 ( 16%) ratio 23 and 16% better p B2    - 03/13/2012  Inhaler technique 90% with dpi but only 50% with mdi > 50% 04/22/12   - Alpha one genotype 02/13/12 > MM  I had an extended discussion with the patient today lasting 15 to 20 minutes of a 25 minute visit on the following issues:    Each maintenance medication Plan A) was reviewed in detail including most importantly the difference between maintenance and as needed (plan  B > E)  and under what circumstances the prns are to be used.  Please see instructions for details which were reviewed in writing and the patient given a copy.

## 2012-12-11 ENCOUNTER — Other Ambulatory Visit: Payer: Self-pay

## 2012-12-11 ENCOUNTER — Encounter (HOSPITAL_COMMUNITY): Payer: Self-pay | Admitting: Emergency Medicine

## 2012-12-11 ENCOUNTER — Telehealth: Payer: Self-pay | Admitting: *Deleted

## 2012-12-11 ENCOUNTER — Emergency Department (HOSPITAL_COMMUNITY)
Admission: EM | Admit: 2012-12-11 | Discharge: 2012-12-11 | Disposition: A | Discharge status: Home / Self Care | Payer: Medicaid Other | Attending: Emergency Medicine | Admitting: Emergency Medicine

## 2012-12-11 ENCOUNTER — Emergency Department (HOSPITAL_COMMUNITY): Payer: Medicaid Other

## 2012-12-11 DIAGNOSIS — Z87891 Personal history of nicotine dependence: Secondary | ICD-10-CM | POA: Insufficient documentation

## 2012-12-11 DIAGNOSIS — Z853 Personal history of malignant neoplasm of breast: Secondary | ICD-10-CM | POA: Insufficient documentation

## 2012-12-11 DIAGNOSIS — R5381 Other malaise: Secondary | ICD-10-CM | POA: Insufficient documentation

## 2012-12-11 DIAGNOSIS — J449 Chronic obstructive pulmonary disease, unspecified: Secondary | ICD-10-CM | POA: Insufficient documentation

## 2012-12-11 DIAGNOSIS — E860 Dehydration: Secondary | ICD-10-CM

## 2012-12-11 DIAGNOSIS — Z862 Personal history of diseases of the blood and blood-forming organs and certain disorders involving the immune mechanism: Secondary | ICD-10-CM | POA: Insufficient documentation

## 2012-12-11 DIAGNOSIS — Z79899 Other long term (current) drug therapy: Secondary | ICD-10-CM | POA: Insufficient documentation

## 2012-12-11 DIAGNOSIS — J4489 Other specified chronic obstructive pulmonary disease: Secondary | ICD-10-CM | POA: Insufficient documentation

## 2012-12-11 DIAGNOSIS — Z8719 Personal history of other diseases of the digestive system: Secondary | ICD-10-CM | POA: Insufficient documentation

## 2012-12-11 LAB — URINALYSIS, ROUTINE W REFLEX MICROSCOPIC
Nitrite: NEGATIVE
Specific Gravity, Urine: 1.028 (ref 1.005–1.030)
pH: 5 (ref 5.0–8.0)

## 2012-12-11 LAB — COMPREHENSIVE METABOLIC PANEL
BUN: 10 mg/dL (ref 6–23)
Calcium: 9 mg/dL (ref 8.4–10.5)
Creatinine, Ser: 1.06 mg/dL (ref 0.50–1.10)
GFR calc Af Amer: 67 mL/min — ABNORMAL LOW (ref >90)
Glucose, Bld: 90 mg/dL (ref 70–99)
Total Protein: 7.7 g/dL (ref 6.0–8.3)

## 2012-12-11 LAB — CBC WITH DIFFERENTIAL/PLATELET
Eosinophils Absolute: 0.2 10*3/uL (ref 0.0–0.7)
Eosinophils Relative: 3 % (ref 0–5)
Hemoglobin: 13.8 g/dL (ref 12.0–15.0)
Lymphs Abs: 2.2 10*3/uL (ref 0.7–4.0)
MCH: 26.5 pg (ref 26.0–34.0)
MCV: 78.3 fL (ref 78.0–100.0)
Monocytes Relative: 10 % (ref 3–12)
Platelets: 325 10*3/uL (ref 150–400)
RBC: 5.21 MIL/uL — ABNORMAL HIGH (ref 3.87–5.11)

## 2012-12-11 LAB — URINE MICROSCOPIC-ADD ON

## 2012-12-11 MED ORDER — SODIUM CHLORIDE 0.9 % IV BOLUS (SEPSIS)
1000.0000 mL | Freq: Once | INTRAVENOUS | Status: AC
  Administered 2012-12-11: 1000 mL via INTRAVENOUS

## 2012-12-11 NOTE — ED Notes (Signed)
Pt feels very weak, lightheaded when standing. Unable to stand longer than a minute. Pt remains AO x 4. Denies pain. Reports intermittent blurred vision, but denies at this time. Family at the bedside.

## 2012-12-11 NOTE — ED Notes (Signed)
Onset today syncope while talking on phone. States laid in bed all day today due to general weakness.  Alert answering and following commands appropriate.

## 2012-12-11 NOTE — ED Notes (Signed)
Verbal order given by greg/rn to draw a "dark green tube".

## 2012-12-11 NOTE — Telephone Encounter (Signed)
Pt called stating she just got up from a rest and noted change in eye sight, hard to describe but not seeing well, dizziness and being off balance.  She states she is not feeling right.  Denies any pain.  Onset of this was sudden starting about 1 hour ago.  She is concern and ask advise.  We will not be able to see pt today and closed for week end.  Pt advised to go to ED for evaluation.  She has someone to bring her. She voices understanding.

## 2012-12-12 NOTE — ED Provider Notes (Signed)
History     CSN: [id number]  Arrival date & time 12/11/12  1707   First MD Initiated Contact with Patient 12/11/12 1804      Chief Complaint  Patient presents with  . Near Syncope  . Fatigue     The history is provided by the patient.  Onset today syncope while talking on phone. States laid in bed all day today due to general weakness. Alert answering and following commands appropriate Denies headache, chest pain or sob.    Past Medical History  Diagnosis Date  . History of right mastectomy 1997  . Personal history of chemotherapy 1997  . COPD (chronic obstructive pulmonary disease) 06/24/2011  . Smoking addiction 06/24/2011    Quit 06/2012  . Insomnia 06/24/2011  . Anemia   . GERD (gastroesophageal reflux disease) 02/03/2012  . 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  Separate hypoattenuating lesions within the liver, favored to represent small cysts or complex cyst. Too small to entirely characterize.   CT abdomen/pelvis 11/2011. 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 2. Also unchanged is a 7 mm low attenuation lesion in segment 8 of  the liver, which is too small to characterize, but is favored to represent a small cyst or biliary hamartoma. 3. Small amount of pneumobilia in the left lobe of the liver, as above, new compared to the prior examination 06/14/2011. This is of uncertain etiology and significance, but is a common finding in patients who have had prior sphincterotomy (clinical correlation is recommended).     . Breast cancer 1997    Past Surgical History  Procedure Laterality Date  . Mastectomy, partial  1997    right   . Tubal ligation      Family History  Problem Relation Age of Onset  .  Hypertension Sister   . Colon cancer Neg Hx   . Esophageal cancer Neg Hx   . Stomach cancer Neg Hx   . Heart disease Brother   . Heart disease Brother   . Heart disease Brother     History  Substance Use Topics  . Smoking status: Former Smoker -- 2.50 packs/day for 41 years    Types: Cigarettes    Quit date: 07/23/2012  . Smokeless tobacco: Never Used  . Alcohol Use: No    OB History   Grav Para Term Preterm Abortions TAB SAB Ect Mult Living                  Review of Systems  Constitutional: Positive for fatigue. Negative for fever and unexpected weight change.  Respiratory: Negative for chest tightness and shortness of breath.   Cardiovascular: Negative for chest pain.  All other systems reviewed and are negative.    Allergies  Aspirin  Home Medications   Current Outpatient Rx  Name  Route  Sig  Dispense  Refill  . albuterol (PROVENTIL HFA;VENTOLIN HFA) 108 (90 BASE) MCG/ACT inhaler   Inhalation   Inhale 2 puffs into the lungs every 6 (six) hours as needed for wheezing.   1 Inhaler   0   . albuterol (PROVENTIL) (2.5 MG/3ML) 0.083% nebulizer solution   Nebulization   Take 2.5 mg by nebulization every 6 (six)  hours as needed for wheezing. For wheezing         . budesonide-formoterol (SYMBICORT) 160-4.5 MCG/ACT inhaler   Inhalation   Inhale 2 puffs into the lungs 2 (two) times daily.         Marland Kitchen gabapentin (NEURONTIN) 400 MG capsule   Oral   Take 1 capsule (400 mg total) by mouth 3 (three) times daily.   90 capsule   2   . pantoprazole (PROTONIX) 40 MG tablet   Oral   Take 1 tablet (40 mg total) by mouth daily.   30 tablet   11   . tiotropium (SPIRIVA) 18 MCG inhalation capsule   Inhalation   Place 1 capsule (18 mcg total) into inhaler and inhale daily.   30 capsule   6   . traMADol (ULTRAM) 50 MG tablet   Oral   Take 50 mg by mouth 2 (two) times daily as needed. For pain           BP 124/83  Pulse 70  Temp(Src) 98 F (36.7 C) (Oral)   Resp 16  Ht _0  (1.651 m)  Wt 131 lb (59.421 kg)  BMI 21.8 kg/m2  SpO2 99%  Physical Exam  Nursing note and vitals reviewed. Constitutional: She is oriented to person, place, and time. She appears well-developed and well-nourished. No distress.  HENT:  Head: Normocephalic and atraumatic.  Mouth/Throat: Mucous membranes are dry.  Eyes: Pupils are equal, round, and reactive to light.  Neck: Normal range of motion.  Cardiovascular: Normal rate and intact distal pulses.   Pulmonary/Chest: No respiratory distress.  Abdominal: Normal appearance. She exhibits no distension.  Musculoskeletal: Normal range of motion.  Neurological: She is alert and oriented to person, place, and time. No cranial nerve deficit.  Skin: Skin is warm and dry. No rash noted.  Psychiatric: She has a normal mood and affect. Her behavior is normal.    ED Course  Procedures (including critical care time) Medications  sodium chloride 0.9 % bolus 1,000 mL (0 mLs Intravenous Stopped 12/11/12 2145)   Labs Reviewed  CBC WITH DIFFERENTIAL - Abnormal; Notable for the following:    RBC 5.21 (*)    RDW 15.9 (*)    All other components within normal limits  COMPREHENSIVE METABOLIC PANEL - Abnormal; Notable for the following:    GFR calc non Af Amer 58 (*)    GFR calc Af Amer 67 (*)    All other components within normal limits  URINALYSIS, ROUTINE W REFLEX MICROSCOPIC - Abnormal; Notable for the following:    Bilirubin Urine SMALL (*)    Leukocytes, UA SMALL (*)    All other components within normal limits  URINE MICROSCOPIC-ADD ON - Abnormal; Notable for the following:    Squamous Epithelial / LPF FEW (*)    Bacteria, UA FEW (*)    All other components within normal limits  URINE CULTURE  GLUCOSE, CAPILLARY   No results found.   1. Dehydration       MDM  After treatment in the ED the patient feels back to baseline and wants to go home.        Dot Lanes, MD 12/14/12 [phone]

## 2012-12-13 LAB — URINE CULTURE

## 2013-02-04 ENCOUNTER — Other Ambulatory Visit: Payer: Self-pay | Admitting: Internal Medicine

## 2013-02-16 ENCOUNTER — Other Ambulatory Visit: Payer: Self-pay | Admitting: *Deleted

## 2013-02-16 DIAGNOSIS — M5412 Radiculopathy, cervical region: Secondary | ICD-10-CM

## 2013-02-16 DIAGNOSIS — R200 Anesthesia of skin: Secondary | ICD-10-CM

## 2013-02-16 DIAGNOSIS — R202 Paresthesia of skin: Secondary | ICD-10-CM

## 2013-02-17 MED ORDER — GABAPENTIN 400 MG PO CAPS: 400.0000 mg | ORAL_CAPSULE | Freq: Three times a day (TID) | ORAL | Status: DC

## 2013-03-08 ENCOUNTER — Emergency Department (HOSPITAL_COMMUNITY)
Admission: EM | Admit: 2013-03-08 | Discharge: 2013-03-08 | Disposition: A | Discharge status: Home / Self Care | Payer: Medicaid Other | Attending: Emergency Medicine | Admitting: Emergency Medicine

## 2013-03-08 ENCOUNTER — Emergency Department (HOSPITAL_COMMUNITY): Payer: Medicaid Other

## 2013-03-08 ENCOUNTER — Encounter (HOSPITAL_COMMUNITY): Payer: Self-pay | Admitting: Emergency Medicine

## 2013-03-08 DIAGNOSIS — K219 Gastro-esophageal reflux disease without esophagitis: Secondary | ICD-10-CM | POA: Insufficient documentation

## 2013-03-08 DIAGNOSIS — R5383 Other fatigue: Secondary | ICD-10-CM | POA: Insufficient documentation

## 2013-03-08 DIAGNOSIS — R059 Cough, unspecified: Secondary | ICD-10-CM | POA: Insufficient documentation

## 2013-03-08 DIAGNOSIS — R5381 Other malaise: Secondary | ICD-10-CM | POA: Insufficient documentation

## 2013-03-08 DIAGNOSIS — J3489 Other specified disorders of nose and nasal sinuses: Secondary | ICD-10-CM | POA: Insufficient documentation

## 2013-03-08 DIAGNOSIS — R079 Chest pain, unspecified: Secondary | ICD-10-CM | POA: Insufficient documentation

## 2013-03-08 DIAGNOSIS — Z853 Personal history of malignant neoplasm of breast: Secondary | ICD-10-CM | POA: Insufficient documentation

## 2013-03-08 DIAGNOSIS — J329 Chronic sinusitis, unspecified: Secondary | ICD-10-CM

## 2013-03-08 DIAGNOSIS — Z87891 Personal history of nicotine dependence: Secondary | ICD-10-CM | POA: Insufficient documentation

## 2013-03-08 DIAGNOSIS — R6884 Jaw pain: Secondary | ICD-10-CM | POA: Insufficient documentation

## 2013-03-08 DIAGNOSIS — G4700 Insomnia, unspecified: Secondary | ICD-10-CM | POA: Insufficient documentation

## 2013-03-08 DIAGNOSIS — Z862 Personal history of diseases of the blood and blood-forming organs and certain disorders involving the immune mechanism: Secondary | ICD-10-CM | POA: Insufficient documentation

## 2013-03-08 DIAGNOSIS — J441 Chronic obstructive pulmonary disease with (acute) exacerbation: Secondary | ICD-10-CM | POA: Insufficient documentation

## 2013-03-08 DIAGNOSIS — Z79899 Other long term (current) drug therapy: Secondary | ICD-10-CM | POA: Insufficient documentation

## 2013-03-08 DIAGNOSIS — R05 Cough: Secondary | ICD-10-CM | POA: Insufficient documentation

## 2013-03-08 DIAGNOSIS — Z8719 Personal history of other diseases of the digestive system: Secondary | ICD-10-CM | POA: Insufficient documentation

## 2013-03-08 LAB — CBC
Hemoglobin: 13.4 g/dL (ref 12.0–15.0)
MCH: 25.9 pg — ABNORMAL LOW (ref 26.0–34.0)
Platelets: 254 10*3/uL (ref 150–400)
RBC: 5.17 MIL/uL — ABNORMAL HIGH (ref 3.87–5.11)

## 2013-03-08 LAB — BASIC METABOLIC PANEL
Calcium: 8.6 mg/dL (ref 8.4–10.5)
GFR calc Af Amer: 71 mL/min — ABNORMAL LOW (ref >90)
GFR calc non Af Amer: 61 mL/min — ABNORMAL LOW (ref >90)
Glucose, Bld: 94 mg/dL (ref 70–99)
Potassium: 3.8 mEq/L (ref 3.5–5.1)
Sodium: 137 mEq/L (ref 135–145)

## 2013-03-08 LAB — POCT I-STAT TROPONIN I: Troponin i, poc: 0 ng/mL (ref 0.00–0.08)

## 2013-03-08 MED ORDER — ALBUTEROL SULFATE (5 MG/ML) 0.5% IN NEBU
INHALATION_SOLUTION | RESPIRATORY_TRACT | Status: AC
  Administered 2013-03-08: 5 mg via RESPIRATORY_TRACT
  Filled 2013-03-08: qty 0.5

## 2013-03-08 MED ORDER — DEXTROSE 5 % IV SOLN
1.0000 g | Freq: Once | INTRAVENOUS | Status: AC
  Administered 2013-03-08: 1 g via INTRAVENOUS
  Filled 2013-03-08: qty 10

## 2013-03-08 MED ORDER — FLUTICASONE PROPIONATE 50 MCG/ACT NA SUSP: 2.0000 | Freq: Every day | NASAL | Status: DC

## 2013-03-08 MED ORDER — METHYLPREDNISOLONE SODIUM SUCC 125 MG IJ SOLR
125.0000 mg | Freq: Once | INTRAMUSCULAR | Status: AC
  Administered 2013-03-08: 125 mg via INTRAVENOUS
  Filled 2013-03-08: qty 2

## 2013-03-08 MED ORDER — PREDNISONE 20 MG PO TABS: 40.0000 mg | ORAL_TABLET | Freq: Every day | ORAL | Status: DC

## 2013-03-08 MED ORDER — AMOXICILLIN 500 MG PO CAPS: 500.0000 mg | ORAL_CAPSULE | Freq: Three times a day (TID) | ORAL | Status: DC

## 2013-03-08 MED ORDER — ALBUTEROL SULFATE (5 MG/ML) 0.5% IN NEBU: 5.0000 mg | INHALATION_SOLUTION | Freq: Once | RESPIRATORY_TRACT | Status: AC

## 2013-03-08 MED ORDER — ALBUTEROL SULFATE (5 MG/ML) 0.5% IN NEBU
5.0000 mg | INHALATION_SOLUTION | Freq: Once | RESPIRATORY_TRACT | Status: AC
  Administered 2013-03-08: 5 mg via RESPIRATORY_TRACT
  Filled 2013-03-08: qty 1

## 2013-03-08 NOTE — ED Provider Notes (Signed)
History     CSN: [id number]  Arrival date & time 03/08/13  0846   First MD Initiated Contact with Patient 03/08/13 [phone]      Chief Complaint  Patient presents with  . Shortness of Breath    (Consider location/radiation/quality/duration/timing/severity/associated sxs/prior treatment) HPI  Mary Jenkins is a 57 y.o. female past medical history significant for COPD and breast cancer in remission complaining of cough, shortness of breath, change in sputum (is now yellow), also endorses a pleuritic chest pain. Patient has been compliant with her inhalers. She denies fever, chills, nausea vomiting, abdominal pain, change in bowel or bladder habits. Patient does endorse a general fatigue. Patient also has clear rhinorrhea and nasal congestion. She reports that she has a positional cough is worse at night.   Past Medical History  Diagnosis Date  . History of right mastectomy 1997  . Personal history of chemotherapy 1997  . COPD (chronic obstructive pulmonary disease) 06/24/2011  . Smoking addiction 06/24/2011    Quit 06/2012  . Insomnia 06/24/2011  . Anemia   . GERD (gastroesophageal reflux disease) 02/03/2012  . 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  Separate hypoattenuating lesions within the liver, favored to represent small cysts or complex cyst. Too small to entirely characterize.   CT abdomen/pelvis 11/2011. 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 2. Also unchanged is a 7 mm low attenuation lesion in segment 8 of  the liver, which is too small to characterize, but is favored to represent a small cyst or biliary hamartoma. 3. Small amount of pneumobilia in the left lobe of the liver, as above, new compared to the  prior examination 06/14/2011. This is of uncertain etiology and significance, but is a common finding in patients who have had prior sphincterotomy (clinical correlation is recommended).     . Breast cancer 1997    Past Surgical History  Procedure Laterality Date  . Mastectomy, partial  1997    right   . Tubal ligation      Family History  Problem Relation Age of Onset  . Hypertension Sister   . Colon cancer Neg Hx   . Esophageal cancer Neg Hx   . Stomach cancer Neg Hx   . Heart disease Brother   . Heart disease Brother   . Heart disease Brother     History  Substance Use Topics  . Smoking status: Former Smoker -- 2.50 packs/day for 41 years    Types: Cigarettes    Quit date: 07/23/2012  . Smokeless tobacco: Never Used  . Alcohol Use: No    OB History   Grav Para Term Preterm Abortions TAB SAB Ect Mult Living                  Review of Systems  Constitutional: Negative for fever.  Respiratory: Positive for cough and shortness of breath.   Cardiovascular: Positive for chest pain.  Gastrointestinal: Negative for nausea, vomiting, abdominal pain and diarrhea.  All other systems reviewed and are negative.    Allergies  Aspirin  Home Medications   Current Outpatient Rx  Name  Route  Sig  Dispense  Refill  . budesonide-formoterol (SYMBICORT) 160-4.5 MCG/ACT inhaler   Inhalation  Inhale 2 puffs into the lungs 2 (two) times daily.         Marland Kitchen gabapentin (NEURONTIN) 400 MG capsule   Oral   Take 1 capsule (400 mg total) by mouth 3 (three) times daily.   90 capsule   3   . pantoprazole (PROTONIX) 40 MG tablet   Oral   Take 1 tablet (40 mg total) by mouth daily.   30 tablet   11   . tiotropium (SPIRIVA) 18 MCG inhalation capsule   Inhalation   Place 1 capsule (18 mcg total) into inhaler and inhale daily.   30 capsule   6   . albuterol (PROVENTIL HFA;VENTOLIN HFA) 108 (90 BASE) MCG/ACT inhaler   Inhalation   Inhale 2 puffs into the lungs every 6 (six)  hours as needed for wheezing.   1 Inhaler   0   . albuterol (PROVENTIL) (2.5 MG/3ML) 0.083% nebulizer solution   Nebulization   Take 2.5 mg by nebulization every 6 (six) hours as needed for wheezing. For wheezing           BP 98/64  Pulse 90  Temp(Src) 98.6 F (37 C) (Oral)  Resp 20  SpO2 96%  Physical Exam  Nursing note and vitals reviewed. Constitutional: She is oriented to person, place, and time. She appears well-developed and well-nourished. No distress.  HENT:  Head: Normocephalic and atraumatic.  Mouth/Throat: Oropharynx is clear and moist.  TTP of bilateral maxillary sinuses.   Posterior pharynx is injected  Eyes: Conjunctivae and EOM are normal.  Cardiovascular: Normal rate, regular rhythm and normal heart sounds.   Pulmonary/Chest: Effort normal and breath sounds normal. No stridor. No respiratory distress. She has no wheezes. She has no rales. She exhibits no tenderness.  Decreased at the bases bilaterally, no wheezing, rhonchi or rales  Abdominal: Soft. Bowel sounds are normal. She exhibits no distension. There is no tenderness. There is no rebound and no guarding.  Musculoskeletal: Normal range of motion.  Neurological: She is alert and oriented to person, place, and time.  Psychiatric: She has a normal mood and affect.    ED Course  Procedures (including critical care time)  Labs Reviewed  BASIC METABOLIC PANEL - Abnormal; Notable for the following:    GFR calc non Af Amer 61 (*)    GFR calc Af Amer 71 (*)    All other components within normal limits  CBC - Abnormal; Notable for the following:    RBC 5.17 (*)    MCV 77.9 (*)    MCH 25.9 (*)    All other components within normal limits  POCT I-STAT TROPONIN I   Dg Chest 2 View (if Patient Has Fever And/or Copd)  03/08/2013   *RADIOLOGY REPORT*  Clinical Data: Short of breath with fever and cough.  CHEST - 2 VIEW  Comparison: 12/11/2012 radiographs.  CT 06/13/2011  Findings: The heart size and  mediastinal contours are stable. There are stable emphysematous changes throughout both lungs.  No pleural effusion or pneumothorax is identified.  Postsurgical changes status post right mastectomy, axillary node dissection and breast implantation are noted.  IMPRESSION: Stable postoperative chest with evidence of emphysema.  No acute findings identified.   Original Report Authenticated By: Richardean Sale, M.D.     1. Sinusitis       MDM   Filed Vitals:   03/08/13 5664 03/08/13 1000 03/08/13 1030 03/08/13 1057  BP:  98/64 100/65   Pulse:  90 82   Temp:  TempSrc:      Resp:  20 16   SpO2: 97% 96% 97% 98%     Mary Jenkins is a 57 y.o. female with COPD complaining of exacerbation with change in sputum. Patient is afebrile she is saturating well on room air, not tachypneic, there is no stridor, no wheezing on exam, chest x-ray shows no infiltrate. Patient has no leukocytosis, electrolytes are grossly normal. And her discomfort is coming from a sinusitis with postnasal drip.  Medications  albuterol (PROVENTIL) (5 MG/ML) 0.5% nebulizer solution 5 mg (5 mg Nebulization Given 03/08/13 0921)  cefTRIAXone (ROCEPHIN) 1 g in dextrose 5 % 50 mL IVPB (0 g Intravenous Stopped 03/08/13 1132)  methylPREDNISolone sodium succinate (SOLU-MEDROL) 125 mg/2 mL injection 125 mg (125 mg Intravenous Given 03/08/13 1044)  albuterol (PROVENTIL) (5 MG/ML) 0.5% nebulizer solution 5 mg (5 mg Nebulization Given 03/08/13 1057)    The patient is hemodynamically stable, appropriate for, and amenable to, discharge at this time. Pt verbalized understanding and agrees with care plan. Outpatient follow-up and return precautions given.    Discharge Medication List as of 03/08/2013  1:17 PM    START taking these medications   Details  amoxicillin (AMOXIL) 500 MG capsule Take 1 capsule (500 mg total) by mouth 3 (three) times daily., Starting 03/08/2013, Until Discontinued, Print    fluticasone (FLONASE) 50 MCG/ACT nasal  spray Place 2 sprays into the nose daily., Starting 03/08/2013, Until Discontinued, [employer], PA-C 03/08/13 [phone]

## 2013-03-08 NOTE — ED Notes (Signed)
Hx of COPD-- started getting SOB yesterday-- coughing up yellow sputum0-- able to speak in complete sentences currently-- has a pulmonologist appt at end of month with Dr. Melvyn Novas

## 2013-03-08 NOTE — ED Provider Notes (Signed)
9:31 AM  Date: 03/08/2013  Rate:84  Rhythm: normal sinus rhythm  QRS Axis: normal  Intervals: normal  ST/T Wave abnormalities: normal  Conduction Disutrbances:none  Narrative Interpretation: Normal EKg  Old EKG Reviewed: unchanged    Mylinda Latina III, MD 03/08/13 [phone]

## 2013-03-08 NOTE — ED Notes (Signed)
BIL BS clear/diminished post tx.  RT Spoke with [place], [place] and no other treatments are necessary at this time.

## 2013-03-08 NOTE — ED Notes (Signed)
Pt unable to successfully perform peak flow.  Minimal effort and cooperation noted from pt.

## 2013-03-09 NOTE — ED Provider Notes (Signed)
Medical screening examination/treatment/procedure(s) were performed by non-physician practitioner and as supervising physician I was immediately available for consultation/collaboration.   Mylinda Latina III, MD 03/09/13 1120

## 2013-03-10 ENCOUNTER — Telehealth: Payer: Self-pay | Admitting: Internal Medicine

## 2013-03-10 MED ORDER — PANTOPRAZOLE SODIUM 40 MG PO TBEC: 40.0000 mg | DELAYED_RELEASE_TABLET | Freq: Every day | ORAL | Status: DC

## 2013-03-10 NOTE — Telephone Encounter (Signed)
Pt aware rx has been sent. Nothing further was needed

## 2013-03-12 ENCOUNTER — Telehealth: Payer: Self-pay | Admitting: Internal Medicine

## 2013-03-12 ENCOUNTER — Encounter (HOSPITAL_COMMUNITY): Payer: Self-pay | Admitting: Internal Medicine

## 2013-03-12 ENCOUNTER — Emergency Department (HOSPITAL_COMMUNITY): Payer: Medicaid Other

## 2013-03-12 ENCOUNTER — Inpatient Hospital Stay (HOSPITAL_COMMUNITY)
Admission: EM | Admit: 2013-03-12 | Discharge: 2013-03-13 | DRG: 192 | Disposition: A | Discharge status: Home / Self Care | Payer: Medicaid Other | Attending: Internal Medicine | Admitting: Internal Medicine

## 2013-03-12 DIAGNOSIS — J441 Chronic obstructive pulmonary disease with (acute) exacerbation: Secondary | ICD-10-CM

## 2013-03-12 DIAGNOSIS — Z853 Personal history of malignant neoplasm of breast: Secondary | ICD-10-CM

## 2013-03-12 DIAGNOSIS — Z79899 Other long term (current) drug therapy: Secondary | ICD-10-CM

## 2013-03-12 DIAGNOSIS — J449 Chronic obstructive pulmonary disease, unspecified: Secondary | ICD-10-CM | POA: Diagnosis present

## 2013-03-12 DIAGNOSIS — R0982 Postnasal drip: Secondary | ICD-10-CM | POA: Diagnosis present

## 2013-03-12 DIAGNOSIS — M5412 Radiculopathy, cervical region: Secondary | ICD-10-CM | POA: Diagnosis present

## 2013-03-12 DIAGNOSIS — Z87891 Personal history of nicotine dependence: Secondary | ICD-10-CM

## 2013-03-12 DIAGNOSIS — G4700 Insomnia, unspecified: Secondary | ICD-10-CM | POA: Diagnosis present

## 2013-03-12 DIAGNOSIS — K219 Gastro-esophageal reflux disease without esophagitis: Secondary | ICD-10-CM | POA: Diagnosis present

## 2013-03-12 DIAGNOSIS — J069 Acute upper respiratory infection, unspecified: Secondary | ICD-10-CM | POA: Diagnosis present

## 2013-03-12 HISTORY — DX: Shortness of breath: R06.02

## 2013-03-12 LAB — BASIC METABOLIC PANEL
Chloride: 103 mEq/L (ref 96–112)
GFR calc Af Amer: 73 mL/min — ABNORMAL LOW (ref >90)
GFR calc non Af Amer: 63 mL/min — ABNORMAL LOW (ref >90)
Potassium: 3.9 mEq/L (ref 3.5–5.1)
Sodium: 140 mEq/L (ref 135–145)

## 2013-03-12 LAB — HEPATIC FUNCTION PANEL
ALT: 13 U/L (ref 0–35)
AST: 15 U/L (ref 0–37)
Total Protein: 6.8 g/dL (ref 6.0–8.3)

## 2013-03-12 LAB — CBC WITH DIFFERENTIAL/PLATELET
Basophils Relative: 0 % (ref 0–1)
Eosinophils Absolute: 0 10*3/uL (ref 0.0–0.7)
Hemoglobin: 12.2 g/dL (ref 12.0–15.0)
MCH: 25.9 pg — ABNORMAL LOW (ref 26.0–34.0)
MCHC: 32.9 g/dL (ref 30.0–36.0)
Neutro Abs: 6.6 10*3/uL (ref 1.7–7.7)
Neutrophils Relative %: 85 % — ABNORMAL HIGH (ref 43–77)
Platelets: 236 10*3/uL (ref 150–400)
RBC: 4.71 MIL/uL (ref 3.87–5.11)

## 2013-03-12 MED ORDER — IPRATROPIUM BROMIDE 0.02 % IN SOLN
0.5000 mg | Freq: Once | RESPIRATORY_TRACT | Status: AC
  Administered 2013-03-12: 0.5 mg via RESPIRATORY_TRACT
  Filled 2013-03-12 (×2): qty 2.5

## 2013-03-12 MED ORDER — FLUTICASONE PROPIONATE 50 MCG/ACT NA SUSP
2.0000 | Freq: Every day | NASAL | Status: DC
  Filled 2013-03-12: qty 16

## 2013-03-12 MED ORDER — ACETAMINOPHEN 325 MG PO TABS: 650.0000 mg | ORAL_TABLET | Freq: Four times a day (QID) | ORAL | Status: DC | PRN

## 2013-03-12 MED ORDER — DIPHENHYDRAMINE HCL 25 MG PO CAPS
25.0000 mg | ORAL_CAPSULE | Freq: Once | ORAL | Status: AC
  Administered 2013-03-12: 25 mg via ORAL
  Filled 2013-03-12: qty 1

## 2013-03-12 MED ORDER — ENOXAPARIN SODIUM 40 MG/0.4ML ~~LOC~~ SOLN
40.0000 mg | SUBCUTANEOUS | Status: DC
  Administered 2013-03-12: 40 mg via SUBCUTANEOUS
  Filled 2013-03-12 (×2): qty 0.4

## 2013-03-12 MED ORDER — ONDANSETRON HCL 4 MG/2ML IJ SOLN: 4.0000 mg | Freq: Four times a day (QID) | INTRAMUSCULAR | Status: DC | PRN

## 2013-03-12 MED ORDER — PANTOPRAZOLE SODIUM 40 MG PO TBEC
40.0000 mg | DELAYED_RELEASE_TABLET | Freq: Every day | ORAL | Status: DC
  Administered 2013-03-12 – 2013-03-13 (×2): 40 mg via ORAL
  Filled 2013-03-12 (×2): qty 1

## 2013-03-12 MED ORDER — ALBUTEROL SULFATE (5 MG/ML) 0.5% IN NEBU
2.5000 mg | INHALATION_SOLUTION | Freq: Four times a day (QID) | RESPIRATORY_TRACT | Status: DC
  Administered 2013-03-12 – 2013-03-13 (×4): 2.5 mg via RESPIRATORY_TRACT
  Filled 2013-03-12 (×4): qty 0.5

## 2013-03-12 MED ORDER — IPRATROPIUM BROMIDE 0.02 % IN SOLN
0.5000 mg | Freq: Four times a day (QID) | RESPIRATORY_TRACT | Status: DC
  Administered 2013-03-12 – 2013-03-13 (×4): 0.5 mg via RESPIRATORY_TRACT
  Filled 2013-03-12 (×4): qty 2.5

## 2013-03-12 MED ORDER — IPRATROPIUM BROMIDE 0.02 % IN SOLN: 0.5000 mg | RESPIRATORY_TRACT | Status: DC | PRN

## 2013-03-12 MED ORDER — HYDROCOD POLST-CHLORPHEN POLST 10-8 MG/5ML PO LQCR: 5.0000 mL | Freq: Two times a day (BID) | ORAL | Status: DC | PRN

## 2013-03-12 MED ORDER — SALINE SPRAY 0.65 % NA SOLN
1.0000 | NASAL | Status: DC | PRN
  Filled 2013-03-12: qty 44

## 2013-03-12 MED ORDER — PREDNISONE 50 MG PO TABS
60.0000 mg | ORAL_TABLET | Freq: Every day | ORAL | Status: DC
  Administered 2013-03-13: 60 mg via ORAL
  Filled 2013-03-12 (×2): qty 1

## 2013-03-12 MED ORDER — ALBUTEROL SULFATE (5 MG/ML) 0.5% IN NEBU
10.0000 mg | INHALATION_SOLUTION | Freq: Once | RESPIRATORY_TRACT | Status: AC
  Administered 2013-03-12: 10 mg via RESPIRATORY_TRACT
  Filled 2013-03-12: qty 2
  Filled 2013-03-12: qty 0.5

## 2013-03-12 MED ORDER — MENTHOL 3 MG MT LOZG
1.0000 | LOZENGE | OROMUCOSAL | Status: DC | PRN
  Filled 2013-03-12: qty 9

## 2013-03-12 MED ORDER — SODIUM CHLORIDE 0.9 % IV SOLN
INTRAVENOUS | Status: AC
  Administered 2013-03-12: 19:00:00 via INTRAVENOUS

## 2013-03-12 MED ORDER — MORPHINE SULFATE 2 MG/ML IJ SOLN: 2.0000 mg | INTRAMUSCULAR | Status: DC | PRN

## 2013-03-12 MED ORDER — ONDANSETRON HCL 4 MG PO TABS: 4.0000 mg | ORAL_TABLET | Freq: Four times a day (QID) | ORAL | Status: DC | PRN

## 2013-03-12 MED ORDER — GABAPENTIN 400 MG PO CAPS
400.0000 mg | ORAL_CAPSULE | Freq: Three times a day (TID) | ORAL | Status: DC
  Administered 2013-03-12 – 2013-03-13 (×2): 400 mg via ORAL
  Filled 2013-03-12 (×4): qty 1

## 2013-03-12 MED ORDER — ACETAMINOPHEN 650 MG RE SUPP: 650.0000 mg | Freq: Four times a day (QID) | RECTAL | Status: DC | PRN

## 2013-03-12 MED ORDER — ALBUTEROL SULFATE (5 MG/ML) 0.5% IN NEBU: 2.5000 mg | INHALATION_SOLUTION | RESPIRATORY_TRACT | Status: DC | PRN

## 2013-03-12 MED ORDER — LEVOFLOXACIN IN D5W 750 MG/150ML IV SOLN
750.0000 mg | INTRAVENOUS | Status: DC
  Administered 2013-03-12: 750 mg via INTRAVENOUS
  Filled 2013-03-12 (×2): qty 150

## 2013-03-12 MED ORDER — ONDANSETRON HCL 4 MG/2ML IJ SOLN: 4.0000 mg | Freq: Three times a day (TID) | INTRAMUSCULAR | Status: DC | PRN

## 2013-03-12 NOTE — ED Notes (Signed)
Pink armband applied to right arm.

## 2013-03-12 NOTE — ED Notes (Signed)
Pt's O2 sats reached 100% and was put on 2 L/min via nasal cannula for comfort.

## 2013-03-12 NOTE — Telephone Encounter (Signed)
Aware, pt in ER

## 2013-03-12 NOTE — Progress Notes (Signed)
Continuous neb started at 1425. BBS diminished, SAT 99% on RA. No distress noted. RT will continue to monitor.

## 2013-03-12 NOTE — H&P (Signed)
Date: 03/12/2013               Patient Name:  Mary Jenkins MRN: [id number]  DOB: May 16, 1956 Age / Sex: 57 y.o., female   PCP: Rosalia Hammers, MD         Medical Service: Internal Medicine Teaching Service         Attending Physician: Dr. Madilyn Fireman, MD    First Contact: Dr. Hayes Ludwig Pager: [phone]  Second Contact: Dr. Doug Sou Pager: [phone]       After Hours (After 5p/  First Contact Pager: [phone]  weekends / holidays): Second Contact Pager: [phone]   Chief Complaint: Cough, shortness of breath  History of Present Illness:  [name] is a pleasant 57 year old woman with PMH of COPD, and GERD who presents to the [facility] ED for shortness of breath. She states that on Monday she developed URI symptoms with runny nose, sinus, pressure, cough productive of yellow sputum, and subjective fever. She was seen at the ED that day and treated for sinusitis and given prescription for amoxicillin, and prednisone. Since then her symptoms have progressively worsened with change in the sputum color to yellow/brown, increased cough, and worsening of her shortness of breath. She has been using her daily inhaler but did not have time to try her nebulizer treatment prior to coming to the ED. She denies smoking, exposure to pets, or dust, and denies having environmental allergies.   She denies abdominal pain, decreased appetite, nausea, or vomiting.   Meds: No current facility-administered medications for this encounter.   Current Outpatient Prescriptions  Medication Sig Dispense Refill  . amoxicillin (AMOXIL) 500 MG capsule Take 500 mg by mouth 3 (three) times daily.      . budesonide-formoterol (SYMBICORT) 160-4.5 MCG/ACT inhaler Inhale 2 puffs into the lungs 2 (two) times daily.      . fluticasone (FLONASE) 50 MCG/ACT nasal spray Place 2 sprays into the nose daily.  16 g  0  . gabapentin (NEURONTIN) 400 MG capsule Take 1 capsule (400 mg total) by mouth 3 (three) times daily.  90 capsule  3  .  pantoprazole (PROTONIX) 40 MG tablet Take 40 mg by mouth daily.      Marland Kitchen tiotropium (SPIRIVA) 18 MCG inhalation capsule Place 1 capsule (18 mcg total) into inhaler and inhale daily.  30 capsule  6  . albuterol (PROVENTIL HFA;VENTOLIN HFA) 108 (90 BASE) MCG/ACT inhaler Inhale 2 puffs into the lungs every 6 (six) hours as needed for wheezing.  1 Inhaler  0  . albuterol (PROVENTIL) (2.5 MG/3ML) 0.083% nebulizer solution Take 2.5 mg by nebulization every 6 (six) hours as needed for wheezing. For wheezing        Allergies: Allergies as of 03/12/2013 - Review Complete 03/12/2013  Allergen Reaction Noted  . Aspirin Other (See Comments) 06/14/2011   Past Medical History  Diagnosis Date  . History of right mastectomy 1997  . Personal history of chemotherapy 1997  . COPD (chronic obstructive pulmonary disease) 06/24/2011  . Smoking addiction 06/24/2011    Quit 06/2012  . Insomnia 06/24/2011  . Anemia   . GERD (gastroesophageal reflux disease) 02/03/2012  . 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  Separate hypoattenuating lesions within the liver, favored to represent small cysts or complex cyst. Too small to entirely characterize.  CT abdomen/pelvis 11/2011. 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 2. Also unchanged is a 7 mm low attenuation lesion in segment 8 of  the liver, which is too small to characterize, but is favored to represent a small cyst or biliary hamartoma. 3. Small amount of pneumobilia in the left lobe of the liver, as above, new compared to the prior examination 06/14/2011. This is of uncertain etiology and significance, but is a common finding in patients who have had prior sphincterotomy (clinical correlation is  recommended).     . Breast cancer 1997   Past Surgical History  Procedure Laterality Date  . Mastectomy, partial  1997    right   . Tubal ligation     Family History  Problem Relation Age of Onset  . Hypertension Sister   . Colon cancer Neg Hx   . Esophageal cancer Neg Hx   . Stomach cancer Neg Hx   . Heart disease Brother   . Heart disease Brother   . Heart disease Brother    History   Social History  . Marital Status: Single    Spouse Name: N/A    Number of Children: N/A  . Years of Education: N/A   Occupational History  . Not on file.   Social History Main Topics  . Smoking status: Former Smoker -- 2.50 packs/day for 41 years    Types: Cigarettes    Quit date: 07/23/2012  . Smokeless tobacco: Never Used  . Alcohol Use: No  . Drug Use: No     Comment: In the past cocaine abuse. Quit 4 years ago.   Marland Kitchen Sexually Active: Not on file   Other Topics Concern  . Not on file   Social History Narrative  . No narrative on file    Review of Systems: Pertinent items are noted in HPI.  Physical Exam: Blood pressure 108/72, pulse 74, temperature 99.1 F (37.3 C), temperature source Oral, resp. rate 16, SpO2 98.00%. Vitals reviewed. General: Sitting up in in bed, in NAD, her sister at her bedside HEENT: PERRL, EOMI, no scleral icterus, MM, red swollen turbinates bilaterally, clear nasal discharge Cardiac: RRR, no rubs, murmurs or gallops Pulm: clear to auscultation bilaterally, no wheezes, rales, transient expiratory rhonchi, no respiratory distress Abd: soft, nontender, nondistended, BS present Ext: warm and well perfused, no pedal edema Neuro: alert and oriented X3, cranial nerves II-XII grossly intact, strength and sensation to light touch equal in bilateral upper and lower extremities   Lab results: Basic Metabolic Panel:  Recent Labs  03/12/13 1437  NA 140  K 3.9  CL 103  CO2 29  GLUCOSE 131*  BUN 13  CREATININE 0.98  CALCIUM 8.4   CBC:  Recent  Labs  03/12/13 1437  WBC 7.8  NEUTROABS 6.6  HGB 12.2  HCT 37.1  MCV 78.8  PLT 236    Urine Drug Screen: Drugs of Abuse     Component Value Date/Time   LABOPIA NONE DETECTED 06/13/2011 2154   COCAINSCRNUR NONE DETECTED 06/13/2011 2154   LABBENZ NONE DETECTED 06/13/2011 2154   AMPHETMU NONE DETECTED 06/13/2011 2154   THCU NONE DETECTED 06/13/2011 2154   LABBARB NONE DETECTED 06/13/2011 2154    Imaging results:  Dg Chest Portable 1 View  03/12/2013   *RADIOLOGY REPORT*  Clinical Data: P, cough  PORTABLE CHEST - 1 VIEW  Comparison: 03/08/2013  Findings: Right breast reconstruction noted. No effusion, infiltrate, or pneumothorax.  IMPRESSION: No acute cardiopulmonary process.   Original Report Authenticated By: Suzy Bouchard, M.D.    Other results: EKG: NSR, normal PR and QRS intervals, borderline right axis deviation, nonspecific T wave changes (flatening in V1 and V2)  Assessment & Plan by Problem:  COPD with acute exacerbation - She has increased cough and sputum changes, increased shortness of breath, COPD exacerbation likely triggered by ongoing. Pneumonia is less likely given that her CXR is unremarkable for PNA and she is afebrile with no leucocytosis.  -Inpatient Observation  -DuoNebs with albuterol and atrovent -Levaquin 528m PO -Prednisone 667mPO daily -O2 supplementation for saturation ,92%  URI (upper respiratory infection) with postnasal drip - Likely viral etiology but could be bacterial as well.  -Tussionex q 12hr PRN for cough -Albuterol inhaler PRN -Flonase nasal spray PRN  GERD. Stable. Continue Protonix  Cervical radiculopathy. Stable. Continue her gabapentin home dose.   DVT prophylaxis: Lovenox SQ  Dispo: Disposition is deferred at this time, awaiting improvement of current medical problems. Anticipated discharge in approximately 1-2 day(s).   The patient does have a current PCP (JRosalia HammersMD) and [facility].  The patient does not have transportation limitations that hinder transportation to clinic appointments.  Signed: SoBlain PaisMD 03/12/2013, 5:31 PM

## 2013-03-12 NOTE — ED Notes (Signed)
Patient arrived via [facility] in respiratory distress x5 days. Patient started enroute to ER with family when EMS had to be called. Patient had unsuccessful neb treatments at home. EMS administered Albuterol 37m x2, Solumedrol 1250m atrovent 0.74m28m1.

## 2013-03-12 NOTE — ED Notes (Signed)
Pt's o2 sats dropped to 87% while ambulating. Pt put on nonrebreather mask at 15 L/min upon returning to her room.

## 2013-03-12 NOTE — ED Provider Notes (Signed)
History     CSN: [id number]  Arrival date & time 03/12/13  1333   First MD Initiated Contact with Patient 03/12/13 1333      Chief Complaint  Patient presents with  . Respiratory Distress    (Consider location/radiation/quality/duration/timing/severity/associated sxs/prior treatment) The history is provided by the patient.   57 year old female comes in because of dyspnea. She woke up this morning with severe dyspnea which did not improve with using her inhaler or taking her other medications. She has had a cough productive of yellowish to greenish sputum. She denies fever, chills, sweats. She denies chest pain. She denies nausea, vomiting, diarrhea. She denies arthralgias or myalgias. She states her breathing was normal yesterday. EMS treated her with 2 breathing treatments and a dose of methylprednisolone. She has noted moderate improvement with the second nebulizer treatment. She was seen in the emergency department 3 days ago and diagnosed with a respiratory tract infection and given a prescription for an antibiotic. She has a history of COPD and has been hospitalized for same but has never been intubated. She is currently a nonsmoker.  Past Medical History  Diagnosis Date  . History of right mastectomy 1997  . Personal history of chemotherapy 1997  . COPD (chronic obstructive pulmonary disease) 06/24/2011  . Smoking addiction 06/24/2011    Quit 06/2012  . Insomnia 06/24/2011  . Anemia   . GERD (gastroesophageal reflux disease) 02/03/2012  . 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  Separate hypoattenuating lesions within the liver, favored to represent small cysts or complex cyst. Too small to entirely characterize.   CT abdomen/pelvis 11/2011. No significant change in size and appearance of small 6 mm hypervascular lesion in segment 4A  of the liver, again favored to  represent a benign perfusion anomaly. 2. Also unchanged is a 7 mm low attenuation lesion in segment 8 of  the liver, which is too small to characterize, but is favored to represent a small cyst or biliary hamartoma. 3. Small amount of pneumobilia in the left lobe of the liver, as above, new compared to the prior examination 06/14/2011. This is of uncertain etiology and significance, but is a common finding in patients who have had prior sphincterotomy (clinical correlation is recommended).     . Breast cancer 1997    Past Surgical History  Procedure Laterality Date  . Mastectomy, partial  1997    right   . Tubal ligation      Family History  Problem Relation Age of Onset  . Hypertension Sister   . Colon cancer Neg Hx   . Esophageal cancer Neg Hx   . Stomach cancer Neg Hx   . Heart disease Brother   . Heart disease Brother   . Heart disease Brother     History  Substance Use Topics  . Smoking status: Former Smoker -- 2.50 packs/day for 41 years    Types: Cigarettes    Quit date: 07/23/2012  . Smokeless tobacco: Never Used  . Alcohol Use: No    OB History   Grav Para Term Preterm Abortions TAB SAB Ect Mult Living                  Review of Systems  All other systems reviewed and are negative.    Allergies  Aspirin  Home Medications  Current Outpatient Rx  Name  Route  Sig  Dispense  Refill  . albuterol (PROVENTIL HFA;VENTOLIN HFA) 108 (90 BASE) MCG/ACT inhaler   Inhalation   Inhale 2 puffs into the lungs every 6 (six) hours as needed for wheezing.   1 Inhaler   0   . albuterol (PROVENTIL) (2.5 MG/3ML) 0.083% nebulizer solution   Nebulization   Take 2.5 mg by nebulization every 6 (six) hours as needed for wheezing. For wheezing         . amoxicillin (AMOXIL) 500 MG capsule   Oral   Take 1 capsule (500 mg total) by mouth 3 (three) times daily.   30 capsule   0   . budesonide-formoterol (SYMBICORT) 160-4.5 MCG/ACT  inhaler   Inhalation   Inhale 2 puffs into the lungs 2 (two) times daily.         . fluticasone (FLONASE) 50 MCG/ACT nasal spray   Nasal   Place 2 sprays into the nose daily.   16 g   0   . gabapentin (NEURONTIN) 400 MG capsule   Oral   Take 1 capsule (400 mg total) by mouth 3 (three) times daily.   90 capsule   3   . pantoprazole (PROTONIX) 40 MG tablet   Oral   Take 1 tablet (40 mg total) by mouth daily.   30 tablet   11   . predniSONE (DELTASONE) 20 MG tablet   Oral   Take 2 tablets (40 mg total) by mouth daily.   10 tablet   0   . tiotropium (SPIRIVA) 18 MCG inhalation capsule   Inhalation   Place 1 capsule (18 mcg total) into inhaler and inhale daily.   30 capsule   6     BP 107/35  Pulse 72  Temp(Src) 99.1 F (37.3 C) (Oral)  Resp 22  SpO2 100%  Physical Exam  Nursing note and vitals reviewed.  57 year old female, in mild respiratory distress using accessory muscles of respiration. She is able to speak in full sentences. Vital signs are significant for tachypnea with respiratory rate of 22. Oxygen saturation is 100%, which is normal. Head is normocephalic and atraumatic. PERRLA, EOMI. Oropharynx is clear. Neck is nontender and supple without adenopathy or JVD. Back is nontender and there is no CVA tenderness. Lungs have distant breath sounds throughout with diffuse, high pitched, expiratory wheezes. There are no rales or rhonchi. Chest is nontender. Heart has regular rate and rhythm without murmur. Abdomen is soft, flat, nontender without masses or hepatosplenomegaly and peristalsis is normoactive. Extremities have no cyanosis or edema, full range of motion is present. Skin is warm and dry without rash. Neurologic: Mental status is normal, cranial nerves are intact, there are no motor or sensory deficits.  ED Course  Procedures (including critical care time)  Results for orders placed during the hospital encounter of 03/12/13  CBC WITH  DIFFERENTIAL      Result Value Range   WBC 7.8  4.0 - 10.5 K/uL   RBC 4.71  3.87 - 5.11 MIL/uL   Hemoglobin 12.2  12.0 - 15.0 g/dL   HCT 37.1  36.0 - 46.0 %   MCV 78.8  78.0 - 100.0 fL   MCH 25.9 (*) 26.0 - 34.0 pg   MCHC 32.9  30.0 - 36.0 g/dL   RDW 15.5  11.5 - 15.5 %   Platelets 236  150 - 400 K/uL   Neutrophils Relative % 85 (*) 43 - 77 %  Neutro Abs 6.6  1.7 - 7.7 K/uL   Lymphocytes Relative 14  12 - 46 %   Lymphs Abs 1.1  0.7 - 4.0 K/uL   Monocytes Relative 1 (*) 3 - 12 %   Monocytes Absolute 0.1  0.1 - 1.0 K/uL   Eosinophils Relative 0  0 - 5 %   Eosinophils Absolute 0.0  0.0 - 0.7 K/uL   Basophils Relative 0  0 - 1 %   Basophils Absolute 0.0  0.0 - 0.1 K/uL  BASIC METABOLIC PANEL      Result Value Range   Sodium 140  135 - 145 mEq/L   Potassium 3.9  3.5 - 5.1 mEq/L   Chloride 103  96 - 112 mEq/L   CO2 29  19 - 32 mEq/L   Glucose, Bld 131 (*) 70 - 99 mg/dL   BUN 13  6 - 23 mg/dL   Creatinine, Ser 0.98  0.50 - 1.10 mg/dL   Calcium 8.4  8.4 - 10.5 mg/dL   GFR calc non Af Amer 63 (*) >90 mL/min   GFR calc Af Amer 73 (*) >90 mL/min   Dg Chest Portable 1 View  03/12/2013   *RADIOLOGY REPORT*  Clinical Data: P, cough  PORTABLE CHEST - 1 VIEW  Comparison: 03/08/2013  Findings: Right breast reconstruction noted. No effusion, infiltrate, or pneumothorax.  IMPRESSION: No acute cardiopulmonary process.   Original Report Authenticated By: Suzy Bouchard, M.D.   \   Date: 03/12/2013  Rate: 76  Rhythm: normal sinus rhythm  QRS Axis: right  Intervals: normal  ST/T Wave abnormalities: normal  Conduction Disutrbances:none  Narrative Interpretation: Borderline right axis deviation. We are ECG available for comparison.  Old EKG Reviewed: unchanged   1. COPD exacerbation       MDM  Exacerbation of COPD. She is already received methylprednisolone and 2 breathing treatments by EMS. She will be given a continuous nebulizer treatment. Chest x-ray will be obtained to rule out  pneumonia and screening labs obtained. If history status has not significantly improved with the third treatment, she will need admission. Old records are reviewed and she was seen on June 17 and discharged with prescriptions for amoxicillin and Flonase. Most recent hospitalization was March of 2013.  3:38 PM Following third nebulizer treatment, and she was no longer in respiratory distress. She's no longer using accessory muscles or respiration. Auscultation of lungs shows prolonged exhalation phase but no wheezing-even with forced exhalation. Oxygen saturation is 98% on room air. She will be given a trial of ambulation and discharged if she does not become hypoxic with ambulation.  3:50 PM Oxygen saturation drops to 97% with ambulation. Case is discussed with Dr. Obie Dredge of internal medicine teaching service who agrees to admit the patient.  Delora Fuel, MD 29/02/11 1552

## 2013-03-12 NOTE — Telephone Encounter (Signed)
I spoke with pt. She c/o being very SOB, chest tx, hurts in chest, pain in lower abdomen. She was in the ED 03/08/13. Was giving pred taper, abx and nasal spray. Not any better. She stated her sister is on her way to take her to the ED right now bc she does not feel right. She called her PCP but never heard back from them so is why she is going. I advised her next time this happened she needed to call our office for an appt instead of waiting. 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

## 2013-03-13 ENCOUNTER — Inpatient Hospital Stay (HOSPITAL_COMMUNITY): Payer: Medicaid Other

## 2013-03-13 LAB — BASIC METABOLIC PANEL
BUN: 14 mg/dL (ref 6–23)
Chloride: 105 mEq/L (ref 96–112)
GFR calc Af Amer: 84 mL/min — ABNORMAL LOW (ref >90)
Potassium: 3.3 mEq/L — ABNORMAL LOW (ref 3.5–5.1)

## 2013-03-13 MED ORDER — PREDNISONE 20 MG PO TABS: 60.0000 mg | ORAL_TABLET | Freq: Every day | ORAL | Status: DC

## 2013-03-13 MED ORDER — LEVOFLOXACIN 750 MG PO TABS: 750.0000 mg | ORAL_TABLET | Freq: Every day | ORAL | Status: DC

## 2013-03-13 MED ORDER — POTASSIUM CHLORIDE CRYS ER 20 MEQ PO TBCR
40.0000 meq | EXTENDED_RELEASE_TABLET | Freq: Once | ORAL | Status: AC
  Administered 2013-03-13: 40 meq via ORAL
  Filled 2013-03-13: qty 2

## 2013-03-13 NOTE — Progress Notes (Signed)
SATURATION QUALIFICATIONS: (This note is used to comply with regulatory documentation for home oxygen)  Patient Saturations on Room Air at Rest = 90% before walking, 95% at rest after walking  Patient Saturations on Room Air while Ambulating = 89-95% walked 225 ft  Patient Saturations on  Room air  Please briefly explain why patient needs home oxygen:

## 2013-03-13 NOTE — Progress Notes (Signed)
Subjective: The patient is doing well this morning and is not having much difficulty breathing. In her own words she is feeling about 80% better this morning. She states that she thinks she caught this from her grandbaby whom she was caring for. She states that she became sick after he was coughing a lot and now she is not coughing as much. She did not get great sleep last night due to nebulizer treatments. She is very familiar with her action plan and knows how to use her nebulizer at home. She is feeling like she could go home today if possible. She is not having productive sputum and clarifies that it was a yellow color at home and then changed to a greenish 2 days ago. She denies any fevers or chills overnight. She denies SOB or abdominal pain. She did well with breakfast and is not having any nausea or vomiting.   Objective: Vital signs in last 24 hours: Filed Vitals:   03/12/13 1857 03/12/13 2100 03/13/13 0536 03/13/13 0729  BP: 114/67 93/60 111/63   Pulse: 86 78 74   Temp: 98.5 F (36.9 C) 98.1 F (36.7 C) 97.8 F (36.6 C)   TempSrc: Oral Oral Oral   Resp: _0 Height: _1  (1.651 m)     Weight: 127 lb 9.6 oz (57.879 kg)     SpO2: 95% 100% 97% 100%   Weight change:   Intake/Output Summary (Last 24 hours) at 03/13/13 1134 Last data filed at 03/13/13 0013  Gross per 24 hour  Intake    120 ml  Output      2 ml  Net    118 ml   General: resting in bed, sits up easily, no acute distress HEENT: PERRL, EOMI, no scleral icterus Cardiac: RRR, no rubs, murmurs or gallops Pulm: moving good amounts of air and no wheezing or rales auscultated Abd: soft, nontender, nondistended, BS present Ext: warm and well perfused, no pedal edema Neuro: alert and oriented X3, cranial nerves II-XII grossly intact  Lab Results: Basic Metabolic Panel:  Recent Labs Lab 03/12/13 1437 03/12/13 1908 03/13/13 0525  NA 140  --  140  K 3.9  --  3.3*  CL 103  --  105  CO2 29  --  27    GLUCOSE 131*  --  102*  BUN 13  --  14  CREATININE 0.98  --  0.88  CALCIUM 8.4  --  8.1*  MG  --  2.3  --    CBC:  Recent Labs Lab 03/08/13 0940 03/12/13 1437  WBC 6.1 7.8  NEUTROABS  --  6.6  HGB 13.4 12.2  HCT 40.3 37.1  MCV 77.9* 78.8  PLT 254 236   Studies/Results: Dg Chest Portable 1 View  03/12/2013   *RADIOLOGY REPORT*  Clinical Data: P, cough  PORTABLE CHEST - 1 VIEW  Comparison: 03/08/2013  Findings: Right breast reconstruction noted. No effusion, infiltrate, or pneumothorax.  IMPRESSION: No acute cardiopulmonary process.   Original Report Authenticated By: Suzy Bouchard, M.D.   Medications: I have reviewed the patient's current medications. Scheduled Meds: . albuterol  2.5 mg Nebulization Q6H   And  . ipratropium  0.5 mg Nebulization Q6H  . enoxaparin (LOVENOX) injection  40 mg Subcutaneous Q24H  . fluticasone  2 spray Each Nare Daily  . gabapentin  400 mg Oral TID  . levofloxacin  750 mg Intravenous Q24H  . pantoprazole  40 mg Oral Daily  . predniSONE  60 mg Oral Q breakfast   Continuous Infusions:  PRN Meds:.acetaminophen, acetaminophen, albuterol, chlorpheniramine-HYDROcodone, ipratropium, menthol-cetylpyridinium, morphine injection, ondansetron (ZOFRAN) IV, ondansetron, sodium chloride Assessment/Plan:  COPD with acute exacerbation - Concern for failure of out-patient regimen however seems to be doing better with prednisone and levofloxacin IV. Will switch to PO levaquin and continue course of 7 days total. Will use prednisone course as well. Advised home nebulizers. She was able to ambulate without desaturating to need home oxygen. Will obtain 2 view CXR given the history of breast cancer and the original description of rusty sputum although the patient denies that today. -IF CXR okay, home today -Will have her use nebulizers as needed with inhalers at home -Levaquin PO for 7 days -Follow up closely in clinic  URI (upper respiratory infection) - Likely  the start of her exacerbation and may have come from her grandchild and developed into COPD exacerbation.   Dispo: Disposition is deferred at this time, awaiting improvement of current medical problems.  Anticipated discharge in approximately 0-1 day(s).   The patient does have a current PCP Rosalia Hammers, MD) and does need an [facility] hospital follow-up appointment after discharge.  The patient does not have transportation limitations that hinder transportation to clinic appointments.  .Services Needed at time of discharge: Y = Yes, Blank = No PT:   OT:   RN:   Equipment:   Other:     LOS: 1 day   Olga Millers, MD 03/13/2013, 11:34 AM

## 2013-03-13 NOTE — Discharge Summary (Signed)
Name: Mary Jenkins MRN: [id number] DOB: 1956-03-11 57 y.o. PCP: Rosalia Hammers, MD  Date of Admission: 03/12/2013  1:33 PM Date of Discharge: 03/13/2013 Attending Physician: Madilyn Fireman, MD  Discharge Diagnosis: Principal Problem:   COPD with acute exacerbation Active Problems:   URI (upper respiratory infection)   Post-nasal drip  Discharge Medications:   Medication List    STOP taking these medications       amoxicillin 500 MG capsule  Commonly known as:  AMOXIL      TAKE these medications       albuterol 108 (90 BASE) MCG/ACT inhaler  Commonly known as:  PROVENTIL HFA;VENTOLIN HFA  Inhale 2 puffs into the lungs every 6 (six) hours as needed for wheezing.     albuterol (2.5 MG/3ML) 0.083% nebulizer solution  Commonly known as:  PROVENTIL  Take 2.5 mg by nebulization every 6 (six) hours as needed for wheezing. For wheezing     budesonide-formoterol 160-4.5 MCG/ACT inhaler  Commonly known as:  SYMBICORT  Inhale 2 puffs into the lungs 2 (two) times daily.     fluticasone 50 MCG/ACT nasal spray  Commonly known as:  FLONASE  Place 2 sprays into the nose daily.     gabapentin 400 MG capsule  Commonly known as:  NEURONTIN  Take 1 capsule (400 mg total) by mouth 3 (three) times daily.     levofloxacin 750 MG tablet  Commonly known as:  LEVAQUIN  Take 1 tablet (750 mg total) by mouth daily.     pantoprazole 40 MG tablet  Commonly known as:  PROTONIX  Take 40 mg by mouth daily.     predniSONE 20 MG tablet  Commonly known as:  DELTASONE  Take 3 tablets (60 mg total) by mouth daily with breakfast.     tiotropium 18 MCG inhalation capsule  Commonly known as:  SPIRIVA  Place 1 capsule (18 mcg total) into inhaler and inhale daily.        Disposition and follow-up:   Mary Jenkins was [facility] from [facility] in Stable condition.  At the hospital follow up visit please address:  1.  Clinical breathing status  Follow-up  Appointments:     Follow-up Information   Follow up with [facility]. (Clinic will call you on Monday with your clinic appointment and time.)    Contact information:   [id number] [place] [address] [phone]      Discharge Instructions: Discharge Orders   Future Appointments Provider Department Dept Phone   03/22/2013 9:00 AM [facility] [phone]   03/22/2013 10:00 AM Tanda Rockers, MD [facility] [phone]   Future Orders Complete By Expires     Call MD for:  As directed     Scheduling Instructions:      Shortness of breath or difficulty breathing or fevers or chills.    Diet - low sodium heart healthy  As directed     Increase activity slowly  As directed        Consultations:  None  Procedures Performed:  Dg Chest 2 View (if Patient Has Fever And/or Copd)  03/08/2013   *RADIOLOGY REPORT*  Clinical Data: Short of breath with fever and cough.  CHEST - 2 VIEW  Comparison: 12/11/2012 radiographs.  CT 06/13/2011  Findings: The heart size and mediastinal contours are stable. There are stable emphysematous changes throughout both lungs.  No pleural effusion or pneumothorax is  identified.  Postsurgical changes status post right mastectomy, axillary node dissection and breast implantation are noted.  IMPRESSION: Stable postoperative chest with evidence of emphysema.  No acute findings identified.   Original Report Authenticated By: Richardean Sale, M.D.   Dg Chest Portable 1 View  03/12/2013   *RADIOLOGY REPORT*  Clinical Data: P, cough  PORTABLE CHEST - 1 VIEW  Comparison: 03/08/2013  Findings: Right breast reconstruction noted. No effusion, infiltrate, or pneumothorax.  IMPRESSION: No acute cardiopulmonary process.   Original Report Authenticated By: Suzy Bouchard, M.D.   Admission HPI:  [name] is a pleasant 57 year old woman with PMH of COPD, and GERD who presents to the [facility] ED  for shortness of breath. She states that on Monday she developed URI symptoms with runny nose, sinus, pressure, cough productive of yellow sputum, and subjective fever. She was seen at the ED that day and treated for sinusitis and given prescription for amoxicillin, and prednisone. Since then her symptoms have progressively worsened with change in the sputum color to yellow/brown, increased cough, and worsening of her shortness of breath. She has been using her daily inhaler but did not have time to try her nebulizer treatment prior to coming to the ED. She denies smoking, exposure to pets, or dust, and denies having environmental allergies.  She denies abdominal pain, decreased appetite, nausea, or vomiting.  Hospital Course by problem list:  COPD with acute exacerbation - The patient does have nebulizers at home and is very familiar with her action plan. She was unable to trial nebulizer prior to coming to the ED. She did desaturate to the 80s in the ED with room air and was briefly on non-rebreather. During the next day she rapidly improved and was able to ambulate on room air the following afternoon without desaturating. She was ambulating at 89% on room air. She received oral prednisone and was changed to IV levaquin due to her recent course with amoxicillin as an out-patient. She was given slightly higher doses of prednisone and was given a 5 day supply of prednisone to help her recover. It was thought to be a viral process but given the length of the course felt that antibiotics would benefit her and she was given total of 7 day course of levaquin. Continued her home inhalers and nebulizers at home. Advised her to try to use nebulizer for the first few days upon going home to help her breathing. Seemed to have reasonable understanding of inhaler technique.  URI (upper respiratory infection) - Likely cause for COPD exacerbation and obtained from a grandchild that the patient was caring for shortly prior  to becoming sick.  Discharge Vitals:   BP 111/63  Pulse 74  Temp(Src) 97.8 F (36.6 C) (Oral)  Resp 18  Ht _0  (1.651 m)  Wt 127 lb 9.6 oz (57.879 kg)  BMI 21.23 kg/m2  SpO2 100%  Discharge Labs:  Results for orders placed during the hospital encounter of 03/12/13 (from the past 24 hour(s))  CBC WITH DIFFERENTIAL     Status: Abnormal   Collection Time    03/12/13  2:37 PM      Result Value Range   WBC 7.8  4.0 - 10.5 K/uL   RBC 4.71  3.87 - 5.11 MIL/uL   Hemoglobin 12.2  12.0 - 15.0 g/dL   HCT 37.1  36.0 - 46.0 %   MCV 78.8  78.0 - 100.0 fL   MCH 25.9 (*) 26.0 - 34.0 pg   MCHC  32.9  30.0 - 36.0 g/dL   RDW 15.5  11.5 - 15.5 %   Platelets 236  150 - 400 K/uL   Neutrophils Relative % 85 (*) 43 - 77 %   Neutro Abs 6.6  1.7 - 7.7 K/uL   Lymphocytes Relative 14  12 - 46 %   Lymphs Abs 1.1  0.7 - 4.0 K/uL   Monocytes Relative 1 (*) 3 - 12 %   Monocytes Absolute 0.1  0.1 - 1.0 K/uL   Eosinophils Relative 0  0 - 5 %   Eosinophils Absolute 0.0  0.0 - 0.7 K/uL   Basophils Relative 0  0 - 1 %   Basophils Absolute 0.0  0.0 - 0.1 K/uL  BASIC METABOLIC PANEL     Status: Abnormal   Collection Time    03/12/13  2:37 PM      Result Value Range   Sodium 140  135 - 145 mEq/L   Potassium 3.9  3.5 - 5.1 mEq/L   Chloride 103  96 - 112 mEq/L   CO2 29  19 - 32 mEq/L   Glucose, Bld 131 (*) 70 - 99 mg/dL   BUN 13  6 - 23 mg/dL   Creatinine, Ser 0.98  0.50 - 1.10 mg/dL   Calcium 8.4  8.4 - 10.5 mg/dL   GFR calc non Af Amer 63 (*) >90 mL/min   GFR calc Af Amer 73 (*) >90 mL/min  HEPATIC FUNCTION PANEL     Status: Abnormal   Collection Time    03/12/13  7:08 PM      Result Value Range   Total Protein 6.8  6.0 - 8.3 g/dL   Albumin 3.4 (*) 3.5 - 5.2 g/dL   AST 15  0 - 37 U/L   ALT 13  0 - 35 U/L   Alkaline Phosphatase 81  39 - 117 U/L   Total Bilirubin 0.2 (*) 0.3 - 1.2 mg/dL   Bilirubin, Direct <0.1  0.0 - 0.3 mg/dL   Indirect Bilirubin NOT CALCULATED  0.3 - 0.9 mg/dL  MAGNESIUM      Status: None   Collection Time    03/12/13  7:08 PM      Result Value Range   Magnesium 2.3  1.5 - 2.5 mg/dL  BASIC METABOLIC PANEL     Status: Abnormal   Collection Time    03/13/13  5:25 AM      Result Value Range   Sodium 140  135 - 145 mEq/L   Potassium 3.3 (*) 3.5 - 5.1 mEq/L   Chloride 105  96 - 112 mEq/L   CO2 27  19 - 32 mEq/L   Glucose, Bld 102 (*) 70 - 99 mg/dL   BUN 14  6 - 23 mg/dL   Creatinine, Ser 0.88  0.50 - 1.10 mg/dL   Calcium 8.1 (*) 8.4 - 10.5 mg/dL   GFR calc non Af Amer 72 (*) >90 mL/min   GFR calc Af Amer 84 (*) >90 mL/min    Signed: Olga Millers, MD 03/13/2013, 12:21 PM   Time Spent on Discharge: 21 minutes Services Ordered on Discharge: none Equipment Ordered on Discharge: none

## 2013-03-13 NOTE — H&P (Signed)
Internal Medicine Teaching Service Attending Note Date: 03/13/2013  Patient name: Mary Jenkins  Medical record number: [id number]  Date of birth: May 09, 1956   57 year old lady with COPD exacerbation (cough with yellow sputum, shortness of breath, sinus like symptoms) with failed outpatient treatment with amoxicillin and prednisone. Has been feeling much better since yesterday with the nebulizations. Former -smoker for 41 years, quit 2013.   Filed Vitals:   03/13/13 0536  BP: 111/63  Pulse: 74  Temp: 97.8 F (36.6 C)  Resp: 18   No acute distress. Alert. Oriented. Lungs: Good air movement, clear to auscultation. No wheezing. Heart: s1s2 rrr, no murmur.  Abdomen: benign.  Extremities: pulses intact, no pedal edema.   Recent Labs Lab 03/08/13 0940 03/12/13 1437  HGB 13.4 12.2  HCT 40.3 37.1  WBC 6.1 7.8  PLT 254 236    Recent Labs Lab 03/08/13 0940 03/12/13 1437 03/12/13 1908 03/13/13 0525  NA 137 140  --  140  K 3.8 3.9  --  3.3*  CL 102 103  --  105  CO2 25 29  --  27  GLUCOSE 94 131*  --  102*  BUN 13 13  --  14  CREATININE 1.01 0.98  --  0.88  CALCIUM 8.6 8.4  --  8.1*  MG  --   --  2.3  --     Recent Labs Lab 03/12/13 1908  AST 15  ALT 13  ALKPHOS 81  BILITOT 0.2*  PROT 6.8  ALBUMIN 3.4*   CXR reviewed. EKG reviewed. No concerns for AMI.    Scheduled Medications . albuterol  2.5 mg Nebulization Q6H   And  . ipratropium  0.5 mg Nebulization Q6H  . enoxaparin (LOVENOX) injection  40 mg Subcutaneous Q24H  . fluticasone  2 spray Each Nare Daily  . gabapentin  400 mg Oral TID  . levofloxacin  750 mg Intravenous Q24H  . pantoprazole  40 mg Oral Daily  . predniSONE  60 mg Oral Q breakfast    Assessment and Plan   I agree with the assessment and plan of care of Dr. Hayes Ludwig with the following additions:  COPD exacerbation - Patient feeling more or less back to baseline. Clinically much improved with duonebs and levoquin administration, along  with steroids. I think its ok to let her go with instructions to continue nebulizations, and levoquin course with prednisone taper, and early follow up with PCP.     Rest per resident note. [place], [place] 03/13/2013, 12:04 PM.

## 2013-03-13 NOTE — Progress Notes (Signed)
Mary Jenkins to be D/C'd Home per MD order.  Discussed with the patient and all questions fully answered.    Medication List    STOP taking these medications       amoxicillin 500 MG capsule  Commonly known as:  AMOXIL      TAKE these medications       albuterol 108 (90 BASE) MCG/ACT inhaler  Commonly known as:  PROVENTIL HFA;VENTOLIN HFA  Inhale 2 puffs into the lungs every 6 (six) hours as needed for wheezing.     albuterol (2.5 MG/3ML) 0.083% nebulizer solution  Commonly known as:  PROVENTIL  Take 2.5 mg by nebulization every 6 (six) hours as needed for wheezing. For wheezing     budesonide-formoterol 160-4.5 MCG/ACT inhaler  Commonly known as:  SYMBICORT  Inhale 2 puffs into the lungs 2 (two) times daily.     fluticasone 50 MCG/ACT nasal spray  Commonly known as:  FLONASE  Place 2 sprays into the nose daily.     gabapentin 400 MG capsule  Commonly known as:  NEURONTIN  Take 1 capsule (400 mg total) by mouth 3 (three) times daily.     levofloxacin 750 MG tablet  Commonly known as:  LEVAQUIN  Take 1 tablet (750 mg total) by mouth daily.     pantoprazole 40 MG tablet  Commonly known as:  PROTONIX  Take 40 mg by mouth daily.     predniSONE 20 MG tablet  Commonly known as:  DELTASONE  Take 3 tablets (60 mg total) by mouth daily with breakfast.     tiotropium 18 MCG inhalation capsule  Commonly known as:  SPIRIVA  Place 1 capsule (18 mcg total) into inhaler and inhale daily.        VVS, Skin clean, dry and intact without evidence of skin break down, no evidence of skin tears noted. IV catheter discontinued intact. Site without signs and symptoms of complications. Dressing and pressure applied.  An After Visit Summary was printed and given to the patient. Follow up appointments , new prescriptions and medication administration times given. Handouts and teach back on COPD exacerbation. Patient escorted via [place], and D/C home via private auto.  Park Breed, RN 03/13/2013 2:56 PM

## 2013-03-14 NOTE — Discharge Summary (Signed)
Internal Medicine Teaching Service Attending Note Date: 03/14/2013  Patient name: Mary Jenkins  Medical record number: [id number]  Date of birth: 1956-03-10    I evaluated the patient on the day of discharge and discussed the discharge plan with my resident team. I agree with the discharge documentation and disposition by the resident.     Thanks Madilyn Fireman 03/14/2013, 5:09 PM

## 2013-03-22 ENCOUNTER — Ambulatory Visit (INDEPENDENT_AMBULATORY_CARE_PROVIDER_SITE_OTHER): Payer: Medicaid Other | Admitting: Internal Medicine

## 2013-03-22 ENCOUNTER — Encounter: Payer: Self-pay | Admitting: Internal Medicine

## 2013-03-22 VITALS — BP 102/70 | HR 86 | Temp 98.6°F | Ht 65.5 in | Wt 131.0 lb

## 2013-03-22 DIAGNOSIS — R0602 Shortness of breath: Secondary | ICD-10-CM

## 2013-03-22 DIAGNOSIS — J441 Chronic obstructive pulmonary disease with (acute) exacerbation: Secondary | ICD-10-CM

## 2013-03-22 LAB — PULMONARY FUNCTION TEST

## 2013-03-22 NOTE — Progress Notes (Signed)
PFT done today. 

## 2013-03-22 NOTE — Patient Instructions (Addendum)
Plan A = automatic symbicort and spiriva   Plan B = Backup, proventil / proaire up to 2 puffs every 4 hours   Plan C = nebulizer albuterol, only use if can't get relief sitting still and used plan B and if start needing nebulizer call asap to see me or Tammy NP same day.  Plan D= Doctor  Plan E = Er  Work on inhaler technique:  relax and gently blow all the way out then take a nice smooth deep breath back in, triggering the inhaler at same time you start breathing in.  Hold for up to 5 seconds if you can.  Rinse and gargle with water when done   If your mouth or throat starts to bother you,   I suggest you time the inhaler to your dental care and after using the inhaler(s) brush teeth and tongue with a baking soda containing toothpaste and when you rinse this out, gargle with it first to see if this helps your mouth and throat.     Please schedule a follow up office visit in 4 weeks, sooner if needed

## 2013-03-22 NOTE — Progress Notes (Signed)
Subjective:    Patient ID: Mary Jenkins, female    DOB: 02/25/1956  MRN: [id number]  HPI  71 yobm quit smoking  07/24/11 not taking breathing medications at that point and subsequently placed on multiple meds and still sob just getting dressed so referred from the medicine clinic at [facility] 02/13/2012 for pulmonary eval.  02/13/2012 1st pulmonary cc progressive worse x 6 months doe x 50-100 ft can't do a grocery store where could do before quit. No real variability, cough is better with no excess or purulent sputum.  Not using saba daytime because finds if she's holding still doesn't need it as oftern. Some better p last  Prednisone rx. Not much variability. rec Stop atrovent and advair Start symbiocort Take 2 puffs first thing in am and then another 2 puffs about 12 hours later.  Take after only am dose Spiriva Protonix 40 mg Take 30-60 min before first meal of the day  GERD diet Only use your albuterol (Plan B= ventolin puffer,  Plan C is nebulizer) as a rescue medication to be used if you can't catch your breath by resting or doing a relaxed purse lip breathing pattern. The less you use it, the better it will work when you need it. Ok to use rescue up to every 4 hours if doing poorly.     03/13/2012 f/u ov/Mary Jenkins cc breathing better but cough worse x 1-2 weeks esp when lie down and since ran out of ppi (didn't know she was supposed to refill despite above instructions). Min thick white mucus, then sleeps ok s exacerbation in am.  Denies variability or need for more than occ daytime saba. No neb use at all  rec Work on inhaler technique:   Start back on protonix 40 mg Take 30-60 min before first meal of the day and Pepcid 20 mg one at bedtime GERD diet    12/02/2012 f/u ov/Mary Jenkins cc back to baseline doe = gets dropped off at door, does shopping leaning on a buggy, and housework slow, can't vacuum - one day after clearing table could not recover breathing at rest > ER  11/03/12- had episode where  she was very SOB, was txed with steroids and neb txs. Breathing has improved since then.  rec Please schedule a follow up visit in 3 months but call sooner if needed with pft's on return.  Plan A = automatic symbicort and spiriva  Plan B = Backup, proventil Plan C = nebulizer, only use if can't relief sitting still and used plan B and still can't recover Plan D= Doctor Plan E = Er  03/22/2013 f/u ov/Mary Jenkins GOLD IV COPD Chief Complaint  Patient presents with  . Follow-up    with PFTs.  Was d/c'd from [facility] last week.  Breathing has improved since d/c but still recovering and building strength.  Has DOE and prod cough with white mucus.  No wheezing, chest tightness, or chest pain at this time.    Normally rarely use saba but needed during event and did not use Plan C as per instructions "you told me to stop it" (see 12/02/12  printed instructions to pt)  No obvious daytime variabilty or assoc chronic cough or cp or chest tightness, subjective wheeze overt sinus or hb symptoms. No unusual exp hx or h/o childhood pna/ asthma or premature birth to his knowledge.      Sleeping ok without nocturnal  or early am exacerbation  of respiratory  c/o's or need for noct saba. Also  denies any obvious fluctuation of symptoms with weather or environmental changes or other aggravating or alleviating factors except as outlined above.  Current Medications, Allergies, Past Medical History, Past Surgical History, Family History, and Social History were reviewed in [employer] record.  ROS  The following are not active complaints unless bolded sore throat, dysphagia, dental problems, itching, sneezing,  nasal congestion or excess/ purulent secretions, ear ache,   fever, chills, sweats, unintended wt loss, pleuritic or exertional cp, hemoptysis,  orthopnea pnd or leg swelling, presyncope, palpitations, heartburn, abdominal pain, anorexia, nausea, vomiting, diarrhea  or change in bowel or urinary  habits, change in stools or urine, dysuria,hematuria,  rash, arthralgias, visual complaints, headache, numbness weakness or ataxia or problems with walking or coordination,  change in mood/affect or memory.             Objective:   Physical Exam  amb thin wf nad with nl vital signs  Wt 120 02/13/2012  > 119 03/13/2012 > 04/22/2012 117 > 07/21/2012 122> 12/02/2012  131> 03/22/2013  131  HEENT mild turbinate edema.  Oropharynx no thrush or excess pnd or cobblestoning.  No JVD or cervical adenopathy. Mild accessory muscle hypertrophy. Trachea midline, nl thryroid. Chest was hyperinflated by percussion with diminished breath sounds and moderate increased exp time without wheeze. Hoover sign positive at mid inspiration. Regular rate and rhythm without murmur gallop or rub or increase P2 or edema.  Abd: no hsm, nl excursion. Ext warm without cyanosis or clubbing.      03/13/13 cxr Stable COPD. No acute findings.     Assessment & Plan:

## 2013-03-24 NOTE — Assessment & Plan Note (Addendum)
-  FEV1 0.39 ( 16%) ratio 23 and 16% better p B2    - 03/13/2012  Inhaler technique 90% with dpi but only 50% with mdi > 50% 04/22/12   - Alpha one genotype 02/13/12 > MM   - 03/22/2013  PFT's 0.56 ( 24%) ratio 33 but 12% better improvement and DLCO 21 corrects to 28%   The proper method of use, as well as anticipated side effects, of a metered-dose inhaler are discussed and demonstrated to the patient. Improved effectiveness after extensive coaching during this visit to a level of approximately  75% so ok to continue symbicort and spiriva for now  I had an extended discussion with the patient today lasting 15 to 20 minutes of a 25 minute visit on the following issues:  Pt did not process previous instructions intended to help prevent er/ admit so I reviewed them line by line with her    Each maintenance medication was reviewed in detail including most importantly the difference between maintenance and as needed and under what circumstances the prns are to be used.  Please see instructions for details which were reviewed in writing and the patient given a copy.

## 2013-03-31 ENCOUNTER — Ambulatory Visit (INDEPENDENT_AMBULATORY_CARE_PROVIDER_SITE_OTHER): Payer: Medicaid Other | Admitting: Internal Medicine

## 2013-03-31 ENCOUNTER — Encounter: Payer: Self-pay | Admitting: Internal Medicine

## 2013-03-31 VITALS — BP 102/72 | HR 79 | Temp 97.1°F | Ht 65.5 in | Wt 129.6 lb

## 2013-03-31 DIAGNOSIS — J449 Chronic obstructive pulmonary disease, unspecified: Secondary | ICD-10-CM

## 2013-03-31 DIAGNOSIS — M25519 Pain in unspecified shoulder: Secondary | ICD-10-CM

## 2013-03-31 DIAGNOSIS — E876 Hypokalemia: Secondary | ICD-10-CM

## 2013-03-31 DIAGNOSIS — M25512 Pain in left shoulder: Secondary | ICD-10-CM

## 2013-03-31 LAB — BASIC METABOLIC PANEL
CO2: 28 mEq/L (ref 19–32)
Chloride: 106 mEq/L (ref 96–112)
Creat: 1.01 mg/dL (ref 0.50–1.10)
Sodium: 139 mEq/L (ref 135–145)

## 2013-03-31 MED ORDER — KETOROLAC TROMETHAMINE 30 MG/ML IJ SOLN
30.0000 mg | Freq: Once | INTRAMUSCULAR | Status: AC
  Administered 2013-03-31: 30 mg via INTRAMUSCULAR

## 2013-03-31 NOTE — Patient Instructions (Signed)
Please follow up with sports medicine  Try rest, ice to left shoulder at least 4 times a day up to 15 minutes, and NSAIDs as needed for pain relief  Continue your respiratory treatments per Dr. Gustavus Bryant recommendations  If you notice worsening of your breathing or fever and chills or worsening of your shoulder pain call the clinic or if severe go to ED

## 2013-03-31 NOTE — Assessment & Plan Note (Signed)
Reports improvement and stable breathing since hospital discharge.  No current sob or wheezing.  Recently saw Dr. Melvyn Novas.  Has been using her inhalers as recommended with proper technique.  Completed course of levaquin and prednisone taper since discharge.   -recommended to continue to follow with Dr. Melvyn Novas and adhere to medications and continue to work on proper technique

## 2013-03-31 NOTE — Progress Notes (Signed)
Subjective:   Patient ID: Mary Jenkins female   DOB: 05/10/1956 57 y.o.   MRN: [id number]  HPI: Mary Jenkins is a 57 y.o. African American female with PMH of COPD stage IV presenting to clinic today for hospital follow up.  She was recently discharged from the hospital on 03/13/13 for COPD exacerbation though to be likely secondary to URI.  Since discharge she has completed her course of antibiotics (levaquin) and short prednisone taper and reports feeling much better.  She denies any current SOB, fever, chills, N/V/D, headaches, cough, or sputum production.  She was recently seen by Dr. Melvyn Novas in his office on 03/24/13 where she was given instructions again on proper use of inhalers. She is currently on spiriva, symbicort, and albuterol inhalers.     Mary Jenkins also reports severe left shoulder pain since hospital discharge.  She claims she was discharged on a Saturday, was working around the house like normal on Sunday and moving some light things around, and then woke up on Monday morning with severe left shoulder pain unable to move her left arm.  The pain has gotten worse over time with limitation of movement of left arm as well.  Her children help her get dressed, she cannot raise her left arm up to her head or reach to brush her hair.  She can move her forearm without complaints but limited motion other than that.  She denies any swelling or rash or trauma to area or any similar prior episodes.  She has not tried much for pain relief, just resting the shoulder and not using it as much.  When at rest, she has no pain.    Past Medical History  Diagnosis Date  . Personal history of chemotherapy 1997  . Insomnia 06/24/2011  . Anemia   . GERD (gastroesophageal reflux disease) 02/03/2012  . 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  Separate  hypoattenuating lesions within the liver, favored to represent small cysts or complex cyst. Too small to entirely characterize.   CT abdomen/pelvis 11/2011. 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 2. Also unchanged is a 7 mm low attenuation lesion in segment 8 of  the liver, which is too small to characterize, but is favored to represent a small cyst or biliary hamartoma. 3. Small amount of pneumobilia in the left lobe of the liver, as above, new compared to the prior examination 06/14/2011. This is of uncertain etiology and significance, but is a common finding in patients who have had prior sphincterotomy (clinical correlation is recommended).     . Breast cancer 1997  . COPD (chronic obstructive pulmonary disease) 06/24/2011  . Emphysema   . Exertional shortness of breath    Current Outpatient Prescriptions  Medication Sig Dispense Refill  . albuterol (PROVENTIL HFA;VENTOLIN HFA) 108 (90 BASE) MCG/ACT inhaler Inhale 2 puffs into the lungs every 6 (six) hours as needed for wheezing.  1 Inhaler  0  . albuterol (PROVENTIL) (2.5 MG/3ML) 0.083% nebulizer solution Take 2.5 mg by nebulization every 6 (six) hours as needed for wheezing. For wheezing      . budesonide-formoterol (SYMBICORT) 160-4.5 MCG/ACT inhaler Inhale 2 puffs into the lungs 2 (two) times daily.      Marland Kitchen  fluticasone (FLONASE) 50 MCG/ACT nasal spray Place 2 sprays into the nose daily.  16 g  0  . gabapentin (NEURONTIN) 400 MG capsule Take 1 capsule (400 mg total) by mouth 3 (three) times daily.  90 capsule  3  . pantoprazole (PROTONIX) 40 MG tablet Take 40 mg by mouth daily.      Marland Kitchen tiotropium (SPIRIVA) 18 MCG inhalation capsule Place 1 capsule (18 mcg total) into inhaler and inhale daily.  30 capsule  6   No current facility-administered medications for this visit.   Family History  Problem Relation Age of Onset  .  Hypertension Sister   . Colon cancer Neg Hx   . Esophageal cancer Neg Hx   . Stomach cancer Neg Hx   . Heart disease Brother   . Heart disease Brother   . Heart disease Brother    History   Social History  . Marital Status: Single    Spouse Name: N/A    Number of Children: N/A  . Years of Education: N/A   Social History Main Topics  . Smoking status: Former Smoker -- 5.00 packs/day for 41 years    Types: Cigarettes    Quit date: 07/23/2012  . Smokeless tobacco: Never Used  . Alcohol Use: No  . Drug Use: Yes    Special: Cocaine     Comment: I6/20/2014 "n the past cocaine abuse; quit before I quit smoking in 06/2012"  . Sexually Active: Not Currently   Other Topics Concern  . Not on file   Social History Narrative  . No narrative on file   Review of Systems:  Constitutional:  Denies fever, chills, diaphoresis, appetite change and fatigue.   HEENT:  Denies congestion, sore throat, rhinorrhea, sneezing, trouble swallowing, neck pain   Respiratory:  Chronic DOE. Currently denies SOB, cough, and wheezing.   Cardiovascular:  Denies palpitations and leg swelling.   Gastrointestinal:  Denies nausea, vomiting, abdominal pain, diarrhea, constipation, blood in stool and abdominal distention.   Genitourinary:  Denies dysuria, urgency, frequency, hematuria, flank pain and difficulty urinating.   Musculoskeletal:  L shoulder pain.  Denies myalgias, back pain, joint swelling, and gait problem.   Skin:  Denies pallor, rash and wound.   Neurological:  Denies dizziness, seizures, syncope, weakness, light-headedness, numbness and headaches.    Objective:  Physical Exam: Filed Vitals:   03/31/13 1050  BP: 102/72  Pulse: 79  Temp: 97.1 F (36.2 C)  TempSrc: Oral  Height: 5' 5.5" (1.664 m)  Weight: 129 lb 9.6 oz (58.786 kg)  SpO2: 95%   Vitals reviewed. General: sitting in chair, NAD HEENT: PERRL, EOMI Cardiac: RRR, no rubs, murmurs or gallops Pulm: clear to auscultation  bilaterally, no wheezes, rales, or rhonchi Abd: soft, nontender, nondistended, BS present Ext: warm and well perfused, no pedal edema, +2DP B/L Neuro: alert and oriented X3, cranial nerves II-XII grossly intact Limited LUE exam due to pain: strength decreased in LUE due to complaints of pain as compared to RUE.  Tenderness to deep palpation of left shoulder and deltoid.  No visible swelling or erythema or rash.  Limited mobility of LUE especially with external and internal rotation, unable to life arm greater than 15 degrees but able to rotate forearm without pain.  5/5 RUE and RLE.  5/5 b/l lower extremities with sensation in tact in all extremities.      Assessment & Plan:   Discussed with Dr.Mullen -L shoulder pain: toradol 74m IM injection x1, refer to sports medicine,  rest, ice, and NSAID prn pain in meantime

## 2013-03-31 NOTE — Assessment & Plan Note (Signed)
Severe pain, x2-3 weeks.  Possible rotator cuff tear vs tendinopathy? Now frozen shoulder.  Very limited mobility and rotation of left arm due to pain, with tenderness to palpation of left shoulder and deltoid.    -toradol injection 59m im x1 -rest, ice, nsaids prn -sports medicine referral, may need MRI imaging for further investigation vs. PT eventually -asked to come back or call if pain worsening

## 2013-04-01 NOTE — Progress Notes (Signed)
Case discussed with Dr. Eula Fried at the time of the visit.  We reviewed the resident's history and exam and pertinent patient test results.  I agree with the assessment, diagnosis, and plan of care documented in the resident's note.

## 2013-04-09 ENCOUNTER — Ambulatory Visit (INDEPENDENT_AMBULATORY_CARE_PROVIDER_SITE_OTHER): Payer: Medicaid Other | Admitting: Sports Medicine

## 2013-04-09 ENCOUNTER — Encounter: Payer: Self-pay | Admitting: Sports Medicine

## 2013-04-09 ENCOUNTER — Telehealth: Payer: Self-pay | Admitting: Internal Medicine

## 2013-04-09 VITALS — BP 125/82 | HR 76 | Ht 65.5 in | Wt 129.0 lb

## 2013-04-09 DIAGNOSIS — M25512 Pain in left shoulder: Secondary | ICD-10-CM

## 2013-04-09 DIAGNOSIS — S43422A Sprain of left rotator cuff capsule, initial encounter: Secondary | ICD-10-CM

## 2013-04-09 DIAGNOSIS — M25519 Pain in unspecified shoulder: Secondary | ICD-10-CM

## 2013-04-09 DIAGNOSIS — S43429A Sprain of unspecified rotator cuff capsule, initial encounter: Secondary | ICD-10-CM

## 2013-04-09 MED ORDER — CLOTRIMAZOLE 10 MG MT TROC: OROMUCOSAL | Status: DC

## 2013-04-09 MED ORDER — METHYLPREDNISOLONE ACETATE 40 MG/ML IJ SUSP
40.0000 mg | Freq: Once | INTRAMUSCULAR | Status: AC
  Administered 2013-04-09: 40 mg via INTRA_ARTICULAR

## 2013-04-09 NOTE — Telephone Encounter (Signed)
Clotrimazole troche 10 mg qid x 3 days refill x 5

## 2013-04-09 NOTE — Telephone Encounter (Signed)
Spoke with the pt and she is c/o having thrush. She states she has sores in her mouth and throat and it is painful to swallow or eat. Pt states she is rinsing her mouth but missed a few days. Please advise. [place] Bing, CMA Allergies  Allergen Reactions  . Aspirin Other (See Comments)    Pt does not remember reaction

## 2013-04-09 NOTE — Patient Instructions (Addendum)
You have pain in your rotator cuff, we injected this today. I would expect you to feel some better either tomorrow or Sunday. Starting on Monday, do the exercises I gave you. We will send you to physical therapy also.

## 2013-04-09 NOTE — Progress Notes (Signed)
CC: Left shoulder pain HPI: Patient is a very pleasant 57 year old female who presents for left shoulder pain x2 weeks. Patient states that she has not had any injury or trigger for the onset of pain. She feels like it is all the way around the shoulder and into the lateral upper arm. Pain has not been worsening but is definitely not improved. She denies any radicular symptoms down into the hand or any paresthesias. Pain is achy at rest and becomes sharp and severe with motion. No previous injury to the shoulder. She has the most pain with external rotation. She is able to sleep on her left. She has not really tried anything for the pain.  ROS: As above in the HPI. All other systems are stable or negative.  PMH: Includes severe COPD, history of breast cancer in remission  PSH: Mastectomy  Social: Patient previously smoked and quit one year ago. She was previously smoking 2-1/2 packs a day. She does not drink alcohol. She is not employed. Family: Family history remarkable for diabetes in her sister, heart disease in her brothers, and sister with high blood pressure  Allergies: No known drug allergies    OBJECTIVE: APPEARANCE:  Patient in no acute distress.The patient appeared well nourished and normally developed. HEENT: No scleral icterus. Conjunctiva non-injected Resp: Non labored Skin: No rash MSK:  Right Shoulder - No swelling or deformity - No TTP over AC joint, biceps tendon. Tenderness to palpation over the superolateral humerus - Range of motion decreased to 90 in forward flexion, 70 of abduction, 45 of external rotation. On back scratch patient can not even put her hand on her hip. - Strength is difficult to assess and is limited in all tests due to pain. -Positive Hawkin's, Neer's for impingement. Empty can unable to be tested due to pain -Neurovascularly intact distally   ASSESSMENT: #1. Left rotator cuff syndrome with positive impingement signs   PLAN: Patient with  severe pain on examination today. I suspect that rehabilitation at this point would be difficult for her due to the degree of her pain. Therefore, suspect she would benefit from corticosteroid injection subacromially. Patient agreed with this procedure today an injection was performed. She was given a home exercise plan to start some range of motion exercises until she can begin therapy. She was also referred to physical therapy today. She will followup in 4 weeks.  Left Subacromial Injection Consent obtained and verified. Tip of acromian located and site marked inferomedial. Skin prepped with alcohol swab. Topical analgesic spray: Ethyl chloride. Joint: Subacromial shoulder Completed without difficulty Meds: 3 cc 1% lidocaine, 1 cc methylprednisolone 40 mg Needle: 25 g 1 1/2 inch Advised to call if fevers/chills, erythema, induration, drainage, or persistent bleeding.

## 2013-04-09 NOTE — Telephone Encounter (Signed)
Rx has been sent in. Pt is aware.

## 2013-04-12 ENCOUNTER — Telehealth: Payer: Self-pay | Admitting: Internal Medicine

## 2013-04-12 MED ORDER — FIRST-DUKES MOUTHWASH MT SUSP: OROMUCOSAL | Status: DC

## 2013-04-12 NOTE — Telephone Encounter (Signed)
Pt called in on 04-09-13 and was prescribed clotrimazole troche and this is too expensive and she is asking for an alternative. Please advise. [place] Bing, CMA

## 2013-04-12 NOTE — Telephone Encounter (Signed)
Called spoke with patient, advised of MW's recs as stated below.  Pt okay with these recommendations and verbalized her understanding.  Rx sent to verified pharmacy.  Nothing further needed at this time, will sign off.

## 2013-04-12 NOTE — Telephone Encounter (Signed)
Could try magic mouthwash x 4 oz 2 tps qid prn swish gargle and swallow

## 2013-04-14 ENCOUNTER — Telehealth: Payer: Self-pay | Admitting: Internal Medicine

## 2013-04-14 MED ORDER — NYSTATIN 100000 UNIT/GM EX POWD: CUTANEOUS | Status: DC

## 2013-04-14 NOTE — Addendum Note (Signed)
Addended by: Rosana Berger on: 04/14/2013 05:48 PM   Modules accepted: Orders

## 2013-04-14 NOTE — Telephone Encounter (Signed)
Rx was sent to pharm  Pt aware

## 2013-04-14 NOTE — Telephone Encounter (Signed)
Spoke with the pt  She states that D[facility] MMW not covered per Medicaid  I advised she needs to call her ins company and see what med is covered and then we can call this in  Will await her call back

## 2013-04-14 NOTE — Telephone Encounter (Signed)
msg closed in error

## 2013-04-14 NOTE — Telephone Encounter (Signed)
Per pt, medicaid told her that the rx was invalid They did not give her an alternative, and instead gave her number for [place] call [phone]  Apparently the rx is not covered They do cover diflucan or nystatin  Will forward to doc of the day  Please advise, thanks

## 2013-04-14 NOTE — Telephone Encounter (Signed)
Nystatin suspension 400,000 IU swish and swallow tid for 5 days.

## 2013-04-19 ENCOUNTER — Encounter: Payer: Self-pay | Admitting: Internal Medicine

## 2013-04-19 ENCOUNTER — Ambulatory Visit (INDEPENDENT_AMBULATORY_CARE_PROVIDER_SITE_OTHER): Payer: Medicaid Other | Admitting: Internal Medicine

## 2013-04-19 VITALS — BP 100/80 | HR 70 | Temp 98.3°F | Ht 65.5 in | Wt 129.2 lb

## 2013-04-19 DIAGNOSIS — J4489 Other specified chronic obstructive pulmonary disease: Secondary | ICD-10-CM

## 2013-04-19 DIAGNOSIS — B370 Candidal stomatitis: Secondary | ICD-10-CM

## 2013-04-19 DIAGNOSIS — J449 Chronic obstructive pulmonary disease, unspecified: Secondary | ICD-10-CM

## 2013-04-19 MED ORDER — NYSTATIN 100000 UNIT/ML MT SUSP: 500000.0000 [IU] | Freq: Four times a day (QID) | OROMUCOSAL | Status: DC

## 2013-04-19 MED ORDER — TIOTROPIUM BROMIDE MONOHYDRATE 18 MCG IN CAPS: 18.0000 ug | ORAL_CAPSULE | Freq: Every day | RESPIRATORY_TRACT | Status: DC

## 2013-04-19 NOTE — Assessment & Plan Note (Signed)
rx nystatin plus reviewed optimal mdi technique and oral hygiene p rx with ics

## 2013-04-19 NOTE — Patient Instructions (Addendum)
Nystatin suspension 4 x daily as needed  If you start needing your albuterol more often than you do now, we need to see you right away (either me or Tammy NP)  Please schedule a follow up visit in 3 months but call sooner if needed

## 2013-04-19 NOTE — Assessment & Plan Note (Addendum)
-  FEV1 0.39 ( 16%) ratio 23 and 16% better p B2    - 03/13/2012  Inhaler technique 90% with dpi but only 50% with mdi > 50% 04/22/12   - Alpha one genotype 02/13/12 > MM   - 03/22/2013  PFT's 0.56 ( 24%) ratio 33 but 12% better improvement and DLCO 21 corrects to 28%     No decline since last round of prednisone while maintaining on laba/lama/ics > no change in recs  The proper method of use, as well as anticipated side effects, of a metered-dose inhaler are discussed and demonstrated to the patient. Improved effectiveness after extensive coaching during this visit to a level of approximately  90%

## 2013-04-19 NOTE — Progress Notes (Signed)
Subjective:    Patient ID: Mary Jenkins, female    DOB: Mar 18, 1956  MRN: [id number]    Brief patient profile:  89 yobm quit smoking  07/24/11 not taking breathing medications at that point and subsequently placed on multiple meds and still sob just getting dressed so referred from the medicine clinic at [facility] 02/13/2012 for pulmonary eval.   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 No real variability, cough is better with no excess or purulent sputum.  Not using saba daytime because finds if she's holding still doesn't need it as oftern. Some better p last  Prednisone rx. Not much variability. rec Stop atrovent and advair Start symbiocort Take 2 puffs first thing in am and then another 2 puffs about 12 hours later.  Take after only am dose Spiriva Protonix 40 mg Take 30-60 min before first meal of the day  GERD diet Only use your albuterol (Plan B= ventolin puffer,  Plan C is nebulizer) as a rescue medication to be used if you can't catch your breath by resting or doing a relaxed purse lip breathing pattern. The less you use it, the better it will work when you need it. Ok to use rescue up to every 4 hours if doing poorly.           03/22/2013 f/u ov/Lashan Macias GOLD IV COPD Chief Complaint  Patient presents with  . Follow-up    with PFTs.  Was d/c'd from [facility] last week.  Breathing has improved since d/c but still recovering and building strength.  Has DOE and prod cough with white mucus.  No wheezing, chest tightness, or chest pain at this time.   Plan A = automatic symbicort and spiriva  Plan B = Backup, proventil / proaire up to 2 puffs every 4 hours  Plan C = nebulizer albuterol, only use if can't get relief sitting still and used plan B and if start needing nebulizer call asap to see me or Tammy NP same day. Plan D= Doctor Plan E = Er Work on inhaler technique:  relax and gently blow all the way  out then take a nice smooth deep breath back in, triggering the inhaler at same time you start breathing in.  Hold for up to 5 seconds if you can.  Rinse and gargle with water when done      04/19/2013 f/u ov/Sherree Shankman re [place] Chief Complaint  Patient presents with  . 4 wk follow up    Breathing has improved.  Does note DOE and occas chest pain.  No wheezing or cough at this time.    Not really limited by breathing while on symbiocort/ spiriva and rare need for saba hfa, never neb now   Cp is fleeting s pleuritic or exertional components.   No obvious daytime variabilty or assoc chronic cough or cp or chest tightness, subjective wheeze overt sinus or hb symptoms. No unusual exp hx or h/o childhood pna/ asthma or premature birth to his knowledge.     Sleeping ok without nocturnal  or early am exacerbation  of respiratory  c/o's or need for noct saba. Also denies any obvious fluctuation of symptoms with weather or environmental changes or other aggravating or alleviating factors except as outlined above.  Current Medications, Allergies, Past Medical History, Past Surgical History, Family History, and Social History were reviewed in [employer] record.  ROS  The following are not active complaints unless bolded sore throat, dysphagia, dental problems, itching, sneezing,  nasal congestion or excess/ purulent secretions, ear ache,   fever, chills, sweats, unintended wt loss, pleuritic or exertional cp, hemoptysis,  orthopnea pnd or leg swelling, presyncope, palpitations, heartburn, abdominal pain, anorexia, nausea, vomiting, diarrhea  or change in bowel or urinary habits, change in stools or urine, dysuria,hematuria,  rash, arthralgias, visual complaints, headache, numbness weakness or ataxia or problems with walking or coordination,  change in mood/affect or memory.             Objective:   Physical Exam  amb thin wf nad with nl vital signs  Wt 120 02/13/2012  >  119 03/13/2012 > 04/22/2012 117 > 07/21/2012 122> 12/02/2012  131> 03/22/2013  131 > 04/19/2013  129  HEENT mild turbinate edema.  Oropharynx no excess pnd or cobblestoning. Mild thrush under lower dentures.  No JVD or cervical adenopathy. Mild accessory muscle hypertrophy. Trachea midline, nl thryroid. Chest was hyperinflated by percussion with diminished breath sounds and moderate increased exp time without wheeze. Hoover sign positive at mid inspiration. Regular rate and rhythm without murmur gallop or rub or increase P2 or edema.  Abd: no hsm, nl excursion. Ext warm without cyanosis or clubbing.      03/13/13 cxr Stable COPD. No acute findings.     Assessment & Plan:

## 2013-04-20 ENCOUNTER — Ambulatory Visit: Payer: Medicaid Other | Admitting: Physical Therapy

## 2013-05-10 ENCOUNTER — Ambulatory Visit: Payer: Medicaid Other | Admitting: Sports Medicine

## 2013-05-13 ENCOUNTER — Ambulatory Visit (INDEPENDENT_AMBULATORY_CARE_PROVIDER_SITE_OTHER): Payer: Medicare Other | Admitting: Sports Medicine

## 2013-05-13 ENCOUNTER — Encounter: Payer: Self-pay | Admitting: Sports Medicine

## 2013-05-13 VITALS — BP 103/64 | HR 79 | Ht 65.5 in | Wt 129.0 lb

## 2013-05-13 DIAGNOSIS — M25519 Pain in unspecified shoulder: Secondary | ICD-10-CM

## 2013-05-13 DIAGNOSIS — M25512 Pain in left shoulder: Secondary | ICD-10-CM

## 2013-05-13 DIAGNOSIS — S43429A Sprain of unspecified rotator cuff capsule, initial encounter: Secondary | ICD-10-CM | POA: Diagnosis not present

## 2013-05-13 MED ORDER — MELOXICAM 15 MG PO TABS: ORAL_TABLET | ORAL | Status: DC

## 2013-05-14 NOTE — Progress Notes (Signed)
  Subjective:    Patient ID: Mary Jenkins, female    DOB: 09-17-1956, 57 y.o.   MRN: [id number]  HPI Patient comes in today for followup on left shoulder pain. She is about 50% better after subacromial cortisone injection. She has been doing her home exercises. Still experiencing some residual discomfort along the lateral shoulder especially with shoulder motion.    Review of Systems     Objective:   Physical Exam Well-developed, well-nourished. No acute distress  Left shoulder: Active forward flexion to about 90 active abduction to 80. Internal rotation actively is limited to about 60-70. Passive external rotation is 80. Global weakness secondary to pain. Neurovascularly intact distally.      Assessment & Plan:  1. Improving left shoulder pain secondary to rotator cuff tendinitis/tendinopathy  Continue with home exercise program. I would like to start the patient on Mobic 15 mg daily for the next 2 weeks. She is cautioned about GI upset and is instructed to stop the medicine immediately ifshe experiences this. Followup again in 4 weeks. If symptoms plateau or worsen consider further diagnostic imaging.

## 2013-05-25 ENCOUNTER — Telehealth: Payer: Self-pay | Admitting: Internal Medicine

## 2013-05-25 MED ORDER — BUDESONIDE-FORMOTEROL FUMARATE 160-4.5 MCG/ACT IN AERO: 2.0000 | INHALATION_SPRAY | Freq: Two times a day (BID) | RESPIRATORY_TRACT | Status: DC

## 2013-05-25 NOTE — Telephone Encounter (Signed)
i spoke with pt and she is aware RX has been sent. She needed nothing further

## 2013-06-03 DIAGNOSIS — C50919 Malignant neoplasm of unspecified site of unspecified female breast: Secondary | ICD-10-CM | POA: Diagnosis not present

## 2013-06-10 ENCOUNTER — Ambulatory Visit: Payer: Medicaid Other | Admitting: Sports Medicine

## 2013-06-15 ENCOUNTER — Encounter (HOSPITAL_BASED_OUTPATIENT_CLINIC_OR_DEPARTMENT_OTHER): Payer: Self-pay | Admitting: *Deleted

## 2013-06-15 NOTE — Progress Notes (Signed)
Pt sees dr wert for copd-used to be heavy smoker-multiple admissions for uri-doing well now-no oxygen or need for neb tx recently. No labs due'  To bring all meds and neb tx meds-use inhalers preop

## 2013-06-17 DIAGNOSIS — C50919 Malignant neoplasm of unspecified site of unspecified female breast: Secondary | ICD-10-CM | POA: Diagnosis not present

## 2013-06-21 ENCOUNTER — Ambulatory Visit (HOSPITAL_BASED_OUTPATIENT_CLINIC_OR_DEPARTMENT_OTHER): Payer: Medicare Other | Admitting: Anesthesiology

## 2013-06-21 ENCOUNTER — Encounter (HOSPITAL_BASED_OUTPATIENT_CLINIC_OR_DEPARTMENT_OTHER)
Admission: RE | Disposition: A | Discharge status: Home / Self Care | Payer: Self-pay | Source: Ambulatory Visit | Attending: Specialist

## 2013-06-21 ENCOUNTER — Ambulatory Visit (HOSPITAL_BASED_OUTPATIENT_CLINIC_OR_DEPARTMENT_OTHER)
Admission: RE | Admit: 2013-06-21 | Discharge: 2013-06-21 | Disposition: A | Discharge status: Home / Self Care | Payer: Medicare Other | Source: Ambulatory Visit | Attending: Specialist | Admitting: Specialist

## 2013-06-21 ENCOUNTER — Encounter (HOSPITAL_BASED_OUTPATIENT_CLINIC_OR_DEPARTMENT_OTHER): Payer: Self-pay | Admitting: Anesthesiology

## 2013-06-21 ENCOUNTER — Encounter (HOSPITAL_BASED_OUTPATIENT_CLINIC_OR_DEPARTMENT_OTHER): Payer: Self-pay | Admitting: *Deleted

## 2013-06-21 DIAGNOSIS — T8544XA Capsular contracture of breast implant, initial encounter: Secondary | ICD-10-CM | POA: Diagnosis not present

## 2013-06-21 DIAGNOSIS — J449 Chronic obstructive pulmonary disease, unspecified: Secondary | ICD-10-CM | POA: Diagnosis not present

## 2013-06-21 DIAGNOSIS — T85898A Other specified complication of other internal prosthetic devices, implants and grafts, initial encounter: Secondary | ICD-10-CM | POA: Diagnosis not present

## 2013-06-21 DIAGNOSIS — C50919 Malignant neoplasm of unspecified site of unspecified female breast: Secondary | ICD-10-CM | POA: Diagnosis not present

## 2013-06-21 DIAGNOSIS — N651 Disproportion of reconstructed breast: Secondary | ICD-10-CM | POA: Insufficient documentation

## 2013-06-21 DIAGNOSIS — J4489 Other specified chronic obstructive pulmonary disease: Secondary | ICD-10-CM | POA: Insufficient documentation

## 2013-06-21 DIAGNOSIS — Z853 Personal history of malignant neoplasm of breast: Secondary | ICD-10-CM | POA: Diagnosis not present

## 2013-06-21 DIAGNOSIS — K219 Gastro-esophageal reflux disease without esophagitis: Secondary | ICD-10-CM | POA: Insufficient documentation

## 2013-06-21 DIAGNOSIS — T85698A Other mechanical complication of other specified internal prosthetic devices, implants and grafts, initial encounter: Secondary | ICD-10-CM | POA: Diagnosis not present

## 2013-06-21 DIAGNOSIS — T8549XA Other mechanical complication of breast prosthesis and implant, initial encounter: Secondary | ICD-10-CM | POA: Diagnosis not present

## 2013-06-21 DIAGNOSIS — Y831 Surgical operation with implant of artificial internal device as the cause of abnormal reaction of the patient, or of later complication, without mention of misadventure at the time of the procedure: Secondary | ICD-10-CM | POA: Insufficient documentation

## 2013-06-21 HISTORY — PX: BREAST CAPSULOTOMY WITH IMPLANT EXCHANGE: SHX5591

## 2013-06-21 HISTORY — DX: Other cervical disc degeneration, unspecified cervical region: M50.30

## 2013-06-21 HISTORY — DX: Complete loss of teeth, unspecified cause, unspecified class: K08.109

## 2013-06-21 HISTORY — PX: BREAST RECONSTRUCTION: SHX9

## 2013-06-21 HISTORY — DX: Presence of dental prosthetic device (complete) (partial): Z97.2

## 2013-06-21 LAB — POCT HEMOGLOBIN-HEMACUE: Hemoglobin: 12.4 g/dL (ref 12.0–15.0)

## 2013-06-21 SURGERY — BREAST CAPSULOTOMY WITH IMPLANT EXCHANGE
Anesthesia: General | Site: Breast | Laterality: Right | Wound class: Clean

## 2013-06-21 MED ORDER — HYDROMORPHONE HCL PF 1 MG/ML IJ SOLN
0.2500 mg | INTRAMUSCULAR | Status: DC | PRN
  Administered 2013-06-21 (×2): 0.5 mg via INTRAVENOUS

## 2013-06-21 MED ORDER — OXYCODONE HCL 5 MG/5ML PO SOLN: 5.0000 mg | Freq: Once | ORAL | Status: DC | PRN

## 2013-06-21 MED ORDER — OXYCODONE HCL 5 MG PO TABS: 5.0000 mg | ORAL_TABLET | Freq: Once | ORAL | Status: DC | PRN

## 2013-06-21 MED ORDER — MIDAZOLAM HCL 2 MG/2ML IJ SOLN: 1.0000 mg | INTRAMUSCULAR | Status: DC | PRN

## 2013-06-21 MED ORDER — LIDOCAINE-EPINEPHRINE 0.5 %-1:200000 IJ SOLN
INTRAMUSCULAR | Status: DC | PRN
  Administered 2013-06-21: 12:00:00

## 2013-06-21 MED ORDER — EPHEDRINE SULFATE 50 MG/ML IJ SOLN
INTRAMUSCULAR | Status: DC | PRN
  Administered 2013-06-21 (×2): 10 mg via INTRAVENOUS

## 2013-06-21 MED ORDER — SODIUM CHLORIDE 0.9 % IV SOLN
INTRAVENOUS | Status: DC | PRN
  Administered 2013-06-21: 13:00:00

## 2013-06-21 MED ORDER — NEOSTIGMINE METHYLSULFATE 1 MG/ML IJ SOLN
INTRAMUSCULAR | Status: DC | PRN
  Administered 2013-06-21: 3 mg via INTRAVENOUS

## 2013-06-21 MED ORDER — CEFAZOLIN SODIUM-DEXTROSE 2-3 GM-% IV SOLR
2.0000 g | INTRAVENOUS | Status: AC
  Administered 2013-06-21: 2 g via INTRAVENOUS

## 2013-06-21 MED ORDER — LIDOCAINE HCL (CARDIAC) 20 MG/ML IV SOLN
INTRAVENOUS | Status: DC | PRN
  Administered 2013-06-21: 100 mg via INTRAVENOUS

## 2013-06-21 MED ORDER — FENTANYL CITRATE 0.05 MG/ML IJ SOLN: 50.0000 ug | Freq: Once | INTRAMUSCULAR | Status: DC

## 2013-06-21 MED ORDER — LACTATED RINGERS IV SOLN
INTRAVENOUS | Status: DC
  Administered 2013-06-21: 10:00:00 via INTRAVENOUS

## 2013-06-21 MED ORDER — FENTANYL CITRATE 0.05 MG/ML IJ SOLN: 50.0000 ug | INTRAMUSCULAR | Status: DC | PRN

## 2013-06-21 MED ORDER — GLYCOPYRROLATE 0.2 MG/ML IJ SOLN
INTRAMUSCULAR | Status: DC | PRN
  Administered 2013-06-21: .5 mg via INTRAVENOUS

## 2013-06-21 MED ORDER — SCOPOLAMINE 1 MG/3DAYS TD PT72: 1.0000 | MEDICATED_PATCH | Freq: Once | TRANSDERMAL | Status: DC

## 2013-06-21 MED ORDER — ROCURONIUM BROMIDE 100 MG/10ML IV SOLN
INTRAVENOUS | Status: DC | PRN
  Administered 2013-06-21: 30 mg via INTRAVENOUS

## 2013-06-21 MED ORDER — PROMETHAZINE HCL 25 MG/ML IJ SOLN: 6.2500 mg | INTRAMUSCULAR | Status: DC | PRN

## 2013-06-21 MED ORDER — DEXAMETHASONE SODIUM PHOSPHATE 4 MG/ML IJ SOLN
INTRAMUSCULAR | Status: DC | PRN
  Administered 2013-06-21: 10 mg via INTRAVENOUS

## 2013-06-21 MED ORDER — FENTANYL CITRATE 0.05 MG/ML IJ SOLN
INTRAMUSCULAR | Status: DC | PRN
  Administered 2013-06-21: 100 ug via INTRAVENOUS

## 2013-06-21 MED ORDER — PROPOFOL 10 MG/ML IV BOLUS
INTRAVENOUS | Status: DC | PRN
  Administered 2013-06-21: 160 mg via INTRAVENOUS

## 2013-06-21 MED ORDER — ONDANSETRON HCL 4 MG/2ML IJ SOLN
INTRAMUSCULAR | Status: DC | PRN
  Administered 2013-06-21: 4 mg via INTRAVENOUS

## 2013-06-21 SURGICAL SUPPLY — 73 items
APL SKNCLS STERI-STRIP NONHPOA (GAUZE/BANDAGES/DRESSINGS) ×4
BAG DECANTER FOR FLEXI CONT (MISCELLANEOUS) ×6 IMPLANT
BALL CTTN LRG ABS STRL LF (GAUZE/BANDAGES/DRESSINGS)
BENZOIN TINCTURE PRP APPL 2/3 (GAUZE/BANDAGES/DRESSINGS) ×6 IMPLANT
BLADE HEX COATED 2.75 (ELECTRODE) ×3 IMPLANT
BLADE KNIFE  20 PERSONNA (BLADE) ×1
BLADE KNIFE 20 PERSONNA (BLADE) ×2 IMPLANT
BLADE KNIFE PERSONA 10 (BLADE) ×3 IMPLANT
BLADE KNIFE PERSONA 15 (BLADE) ×6 IMPLANT
CANISTER SUCTION 1200CC (MISCELLANEOUS) ×3 IMPLANT
CLOTH BEACON ORANGE TIMEOUT ST (SAFETY) ×3 IMPLANT
COTTONBALL LRG STERILE PKG (GAUZE/BANDAGES/DRESSINGS) IMPLANT
COVER MAYO STAND STRL (DRAPES) ×3 IMPLANT
COVER TABLE BACK 60X90 (DRAPES) ×3 IMPLANT
DECANTER SPIKE VIAL GLASS SM (MISCELLANEOUS) ×5 IMPLANT
DRAIN CHANNEL 10M FLAT 3/4 FLT (DRAIN) ×3 IMPLANT
DRAIN PENROSE 1/4X12 LTX STRL (WOUND CARE) IMPLANT
DRAPE LAPAROSCOPIC ABDOMINAL (DRAPES) ×3 IMPLANT
DRAPE UTILITY XL STRL (DRAPES) ×3 IMPLANT
DRSG PAD ABDOMINAL 8X10 ST (GAUZE/BANDAGES/DRESSINGS) ×12 IMPLANT
ELECT BLADE 6.5 .24CM SHAFT (ELECTRODE) IMPLANT
ELECT REM PT RETURN 9FT ADLT (ELECTROSURGICAL) ×3
ELECTRODE REM PT RTRN 9FT ADLT (ELECTROSURGICAL) ×2 IMPLANT
EVACUATOR SILICONE 100CC (DRAIN) ×3 IMPLANT
FILTER 7/8 IN (FILTER) ×3 IMPLANT
GAUZE XEROFORM 1X8 LF (GAUZE/BANDAGES/DRESSINGS) ×6 IMPLANT
GAUZE XEROFORM 5X9 LF (GAUZE/BANDAGES/DRESSINGS) ×3 IMPLANT
GLOVE BIO SURGEON STRL SZ 6.5 (GLOVE) ×3 IMPLANT
GLOVE BIOGEL M STRL SZ7.5 (GLOVE) ×3 IMPLANT
GLOVE BIOGEL PI IND STRL 8 (GLOVE) ×2 IMPLANT
GLOVE BIOGEL PI INDICATOR 8 (GLOVE) ×1
GLOVE ECLIPSE 7.0 STRL STRAW (GLOVE) ×3 IMPLANT
GOWN PREVENTION PLUS XLARGE (GOWN DISPOSABLE) IMPLANT
GOWN PREVENTION PLUS XXLARGE (GOWN DISPOSABLE) ×5 IMPLANT
IMPLANT BREAST SALINE 650CC (Breast) ×2 IMPLANT
IV NS 500ML (IV SOLUTION) ×12
IV NS 500ML BAXH (IV SOLUTION) ×8 IMPLANT
KIT FILL SYSTEM UNIVERSAL (SET/KITS/TRAYS/PACK) ×3 IMPLANT
NDL HYPO 25X1 1.5 SAFETY (NEEDLE) IMPLANT
NDL SAFETY ECLIPSE 18X1.5 (NEEDLE) ×1 IMPLANT
NDL SPNL 18GX3.5 QUINCKE PK (NEEDLE) IMPLANT
NEEDLE HYPO 18GX1.5 SHARP (NEEDLE) ×3
NEEDLE HYPO 22GX1.5 SAFETY (NEEDLE) ×3 IMPLANT
NEEDLE HYPO 25X1 1.5 SAFETY (NEEDLE) IMPLANT
NEEDLE SPNL 18GX3.5 QUINCKE PK (NEEDLE) ×3 IMPLANT
NS IRRIG 1000ML POUR BTL (IV SOLUTION) ×3 IMPLANT
PACK BASIN DAY SURGERY FS (CUSTOM PROCEDURE TRAY) ×3 IMPLANT
PEN SKIN MARKING BROAD TIP (MISCELLANEOUS) ×3 IMPLANT
PIN SAFETY STERILE (MISCELLANEOUS) IMPLANT
SLEEVE SCD COMPRESS KNEE MED (MISCELLANEOUS) ×3 IMPLANT
SPONGE GAUZE 4X4 12PLY (GAUZE/BANDAGES/DRESSINGS) ×5 IMPLANT
SPONGE LAP 18X18 X RAY DECT (DISPOSABLE) ×10 IMPLANT
STRIP SUTURE WOUND CLOSURE 1/2 (SUTURE) ×3 IMPLANT
SUT ETHILON 5 0 PS 2 18 (SUTURE) IMPLANT
SUT MNCRL AB 3-0 PS2 18 (SUTURE) ×3 IMPLANT
SUT MON AB 2-0 CT1 36 (SUTURE) ×3 IMPLANT
SUT MON AB 5-0 PS2 18 (SUTURE) IMPLANT
SUT PROLENE 3 0 PS 2 (SUTURE) ×2 IMPLANT
SUT PROLENE 4 0 PS 2 18 (SUTURE) IMPLANT
SYR 20CC LL (SYRINGE) IMPLANT
SYR 50ML LL SCALE MARK (SYRINGE) ×6 IMPLANT
SYR BULB IRRIGATION 50ML (SYRINGE) ×3 IMPLANT
SYR CONTROL 10ML LL (SYRINGE) ×3 IMPLANT
TAPE HYPAFIX 6X30 (GAUZE/BANDAGES/DRESSINGS) ×3 IMPLANT
TAPE MEASURE 72IN RETRACT (INSTRUMENTS)
TAPE MEASURE LINEN 72IN RETRCT (INSTRUMENTS) IMPLANT
TOWEL OR 17X24 6PK STRL BLUE (TOWEL DISPOSABLE) ×12 IMPLANT
TOWEL OR NON WOVEN STRL DISP B (DISPOSABLE) ×3 IMPLANT
TRAY DSU PREP LF (CUSTOM PROCEDURE TRAY) ×3 IMPLANT
TUBE CONNECTING 20X1/4 (TUBING) ×3 IMPLANT
UNDERPAD 30X30 INCONTINENT (UNDERPADS AND DIAPERS) ×6 IMPLANT
VAC PENCILS W/TUBING CLEAR (MISCELLANEOUS) ×3 IMPLANT
YANKAUER SUCT BULB TIP NO VENT (SUCTIONS) ×3 IMPLANT

## 2013-06-21 NOTE — Progress Notes (Signed)
Patient progressed to phase 2. Oxygen sat at 88 on room air.  Placed on 2l [place]

## 2013-06-21 NOTE — Anesthesia Preprocedure Evaluation (Signed)
Anesthesia Evaluation  Patient identified by MRN, date of birth, ID band Patient awake    Reviewed: Allergy & Precautions, H&P , NPO status , Patient's Chart, lab work & pertinent test results  Airway Mallampati: I TM Distance: >3 FB Neck ROM: Full    Dental   Pulmonary shortness of breath, COPDformer smoker,  + rhonchi   + decreased breath sounds      Cardiovascular Rhythm:Regular Rate:Normal     Neuro/Psych  Neuromuscular disease    GI/Hepatic GERD-  ,(+)     substance abuse  cocaine use,   Endo/Other    Renal/GU      Musculoskeletal   Abdominal   Peds  Hematology   Anesthesia Other Findings Breast ca  Reproductive/Obstetrics                           Anesthesia Physical Anesthesia Plan  ASA: III  Anesthesia Plan: General   Post-op Pain Management:    Induction: Intravenous  Airway Management Planned: Oral ETT  Additional Equipment:   Intra-op Plan:   Post-operative Plan: Extubation in OR  Informed Consent: I have reviewed the patients History and Physical, chart, labs and discussed the procedure including the risks, benefits and alternatives for the proposed anesthesia with the patient or authorized representative who has indicated his/her understanding and acceptance.     Plan Discussed with: CRNA and Surgeon  Anesthesia Plan Comments:         Anesthesia Quick Evaluation

## 2013-06-21 NOTE — H&P (Signed)
Mary Jenkins is an 57 y.o. female.   Chief Complaint: SP right breast cancer mastectomy with resultant reconstruction HPI: Since right braest reconstruction patient has developed intense capsular formation and marj=ked assymmetry of the recinstructed side(TOO SMALL Surgery previously done by Dr Mary Jenkins plastic surgery  Past Medical History  Diagnosis Date  . Personal history of chemotherapy 1997  . Insomnia 06/24/2011  . Anemia   . GERD (gastroesophageal reflux disease) 02/03/2012  . 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  Separate hypoattenuating lesions within the liver, favored to represent small cysts or complex cyst. Too small to entirely characterize.   CT abdomen/pelvis 11/2011. 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 2. Also unchanged is a 7 mm low attenuation lesion in segment 8 of  the liver, which is too small to characterize, but is favored to represent a small cyst or biliary hamartoma. 3. Small amount of pneumobilia in the left lobe of the liver, as above, new compared to the prior examination 06/14/2011. This is of uncertain etiology and significance, but is a common finding in patients who have had prior sphincterotomy (clinical correlation is recommended).     . COPD (chronic obstructive pulmonary disease) 06/24/2011  . Emphysema   . Exertional shortness of breath   . DDD (degenerative disc disease), cervical   . Wears glasses   . Full dentures   . Breast cancer 1997    chemo-no [facility]    Past Surgical History  Procedure Laterality Date  . Tubal ligation  1980's  . Orif wrist fracture      lt-age 46  . Colonoscopy    . Breast biopsy Left 1997  . Breast surgery  97    implant  . Mastectomy, partial  Right 1997    -node dissection    Family History  Problem Relation Age of Onset  . Hypertension Sister   . Colon cancer Neg Hx   . Esophageal cancer Neg Hx   . Stomach cancer Neg Hx   . Heart disease Brother   . Heart disease Brother   . Heart disease Brother    Social History:  reports that she quit smoking about 10 months ago. Her smoking use included Cigarettes. She has a 205 pack-year smoking history. She has never used smokeless tobacco. She reports that she uses illicit drugs (Cocaine). She reports that she does not drink alcohol.  Allergies:  Allergies  Allergen Reactions  . Aspirin Other (See Comments)    Pt does not remember reaction    Medications Prior to Admission  Medication Sig Dispense Refill  . albuterol (PROVENTIL) (2.5 MG/3ML) 0.083% nebulizer solution Take 2.5 mg by nebulization every 6 (six) hours as needed for wheezing. For wheezing      . budesonide-formoterol (SYMBICORT) 160-4.5 MCG/ACT inhaler Inhale 2 puffs into the lungs 2 (two) times daily.  1 Inhaler  5  . gabapentin (NEURONTIN) 400 MG capsule Take 400 mg by mouth 3 (three) times daily.      . pantoprazole (PROTONIX) 40 MG tablet Take 40 mg by mouth daily.      Marland Kitchen tiotropium (SPIRIVA) 18 MCG inhalation capsule Place 1 capsule (18 mcg total) into inhaler and inhale daily.  30 capsule  11  . albuterol (PROVENTIL HFA;VENTOLIN HFA) 108 (90 BASE) MCG/ACT inhaler Inhale 2 puffs into the lungs every 6 (six) hours as needed for wheezing.  1 Inhaler  0  . nystatin (MYCOSTATIN) 100000 UNIT/ML suspension Take 5 mLs (500,000 Units total) by mouth 4 (four) times daily.  60 mL  0    No results found for this or any previous visit (from the past 48 hour(s)). No results found.  Review of Systems  Constitutional: Negative.   HENT: Negative.   Eyes: Negative.   Respiratory: Negative.   Cardiovascular: Negative.   Gastrointestinal: Positive for heartburn.  Genitourinary: Negative.   Musculoskeletal: Negative.    Skin: Negative.   Neurological: Negative.   Endo/Heme/Allergies: Negative.   Psychiatric/Behavioral: Negative.     Blood pressure 113/74, pulse 78, temperature 98.1 F (36.7 C), temperature source Oral, resp. rate 20, height 5' 5.5" (1.664 m), weight 58.514 kg (129 lb), SpO2 100.00%. Physical Exam   Assessment/Plan Sp right breast reconstruction with severe assymmetry and capsular formation for removal of previous implant  Mary Jenkins L 06/21/2013, 11:17 AM

## 2013-06-21 NOTE — Anesthesia Procedure Notes (Signed)
Procedure Name: Intubation Performed by: Tawni Millers Pre-anesthesia Checklist: Patient identified, Emergency Drugs available, Suction available and Patient being monitored Patient Re-evaluated:Patient Re-evaluated prior to inductionOxygen Delivery Method: Circle System Utilized Preoxygenation: Pre-oxygenation with 100% oxygen Intubation Type: IV induction Ventilation: Mask ventilation without difficulty Laryngoscope Size: Mac and 3 Grade View: Grade I Tube type: Oral Tube size: 7.0 mm Number of attempts: 1 Airway Equipment and Method: stylet and oral airway Placement Confirmation: ETT inserted through vocal cords under direct vision,  positive ETCO2 and breath sounds checked- equal and bilateral Tube secured with: Tape Dental Injury: Teeth and Oropharynx as per pre-operative assessment

## 2013-06-21 NOTE — Anesthesia Postprocedure Evaluation (Signed)
  Anesthesia Post-op Note  Patient: Mary Jenkins  Procedure(s) Performed: Procedure(s): REVISION RIGHT BREAST RECONSTRUCTION/REMOVAL OF RIGHT IMPLANT/RIGHT BREAST CAPSULOTOMY WITH INSERT TISSUE EXPLANDER RIGHT BREAST/POSSIBLE LEFT BREAST MASTOPEXY (Right) BREAST RECONSTRUCTION (Right)  Patient Location: PACU  Anesthesia Type:General  Level of Consciousness: awake  Airway and Oxygen Therapy: Patient Spontanous Breathing  Post-op Pain: mild  Post-op Assessment: Post-op Vital signs reviewed, Patient's Cardiovascular Status Stable, Respiratory Function Stable, Patent Airway, No signs of Nausea or vomiting and Pain level controlled  Post-op Vital Signs: Reviewed and stable  Complications: No apparent anesthesia complications

## 2013-06-21 NOTE — Progress Notes (Signed)
Dr. Chriss Driver in to examine patient.

## 2013-06-21 NOTE — Transfer of Care (Signed)
Immediate Anesthesia Transfer of Care Note  Patient: Mary Jenkins  Procedure(s) Performed: Procedure(s): REVISION RIGHT BREAST RECONSTRUCTION/REMOVAL OF RIGHT IMPLANT/RIGHT BREAST CAPSULOTOMY WITH INSERT TISSUE EXPLANDER RIGHT BREAST/POSSIBLE LEFT BREAST MASTOPEXY (Right) BREAST RECONSTRUCTION (Right)  Patient Location: PACU  Anesthesia Type:General  Level of Consciousness: awake and sedated  Airway & Oxygen Therapy: Patient Spontanous Breathing and Patient connected to face mask oxygen  Post-op Assessment: Report given to PACU RN and Post -op Vital signs reviewed and stable  Post vital signs: Reviewed and stable  Complications: No apparent anesthesia complications

## 2013-06-21 NOTE — Brief Op Note (Signed)
06/21/2013  12:51 PM  PATIENT:  [name]  57 y.o. female  PRE-OPERATIVE DIAGNOSIS:  Complication due to breast implant  POST-OPERATIVE DIAGNOSIS:  Complication due to breast implant  PROCEDURE:  Procedure(s): REVISION RIGHT BREAST RECONSTRUCTION/REMOVAL OF RIGHT IMPLANT/RIGHT BREAST CAPSULOTOMY WITH INSERT TISSUE EXPLANDER RIGHT BREAST/POSSIBLE LEFT BREAST MASTOPEXY (Right) BREAST RECONSTRUCTION (Right)  SURGEON:  Surgeon(s) and Role:    * Cristine Polio, MD - Primary  PHYSICIAN ASSISTANT:   ASSISTANTS: none   ANESTHESIA:   general  EBL:  Total I/O In: 1400 [I.V.:1400] Out: -   BLOOD ADMINISTERED:none  DRAINS: (right lateral chest area) Jackson-Pratt drain(s) with closed bulb suction in the right lateral chest area   LOCAL MEDICATIONS USED:  LIDOCAINE   SPECIMEN:  Excision  DISPOSITION OF SPECIMEN:  PATHOLOGY  COUNTS:  YES  TOURNIQUET:  * No tourniquets in log *  DICTATION: .Other Dictation: Dictation Number [id number]  PLAN OF CARE: Discharge to home after PACU  PATIENT DISPOSITION:  PACU - hemodynamically stable.   Delay start of Pharmacological VTE agent (>24hrs) due to surgical blood loss or risk of bleeding: yes

## 2013-06-21 NOTE — Progress Notes (Signed)
Patient more awake and alert maintaining sats in mid 90's on room air.

## 2013-06-22 ENCOUNTER — Other Ambulatory Visit: Payer: Self-pay | Admitting: *Deleted

## 2013-06-22 ENCOUNTER — Encounter (HOSPITAL_BASED_OUTPATIENT_CLINIC_OR_DEPARTMENT_OTHER): Payer: Self-pay | Admitting: Specialist

## 2013-06-22 MED ORDER — GABAPENTIN 400 MG PO CAPS: 400.0000 mg | ORAL_CAPSULE | Freq: Three times a day (TID) | ORAL | Status: DC

## 2013-06-22 NOTE — Op Note (Signed)
NAMEFIZA, NATION                ACCOUNT NO.:  [id number]  MEDICAL RECORD NO.:  [id number]  LOCATION:                                FACILITY:  [facility]  PHYSICIAN:  Berneta Sages L. Amanee Iacovelli, M.D.DATE OF BIRTH:  03/09/1956  DATE OF PROCEDURE:  06/21/2013 DATE OF DISCHARGE:  06/21/2013                              OPERATIVE REPORT   INDICATIONS:  This is a 57 year old lady, who previously had mastectomy done by Dr. Chalmers Cater several years ago and reconstruction by Dr. Harlow Mares, who now has severe scar cicatrix involving the right breast area capsule formation, Baker's IV with asymmetry and asymmetrical breasts severely deformative in appearance with the implant markedly too small for the reconstruction.  PROCEDURES DONE:  Exploration of area, removal of scar tissue, and complete capsulectomy with implant removal, freeing up the space subpectoral, and skin dissection to allow much larger tissue expander and for future saline injections for reconstruction.  Also, scar revision of the right breast incision area towards the midline with Z- plasty reconstruction.  ANESTHESIA:  General.  DESCRIPTION OF PROCEDURE:  The patient underwent drawings for the areas, midline of the areas, inframammary fold to balance off the left side and right side, and anterior axillary line.  She underwent general anesthesia intubated orally.  Prep was done to the chest and breast areas bilaterally.  Using Hibiclens, soap, and solution walled off with sterile towels and drapes so as to make a sterile field.  The previous transverse incision was entered with a #94 blade delicately down to the capsule overlying the implant.  There was very, very little muscle on approximately 3-4 mm of muscle and the flap inferior to the midline incision.  I did a pericapsular dissection using Metzenbaum scissors. Using blunt dissection I was able to remove the mass and remove the implant with this capsule entirely.  Hemostasis was  maintained the Bovie anticoagulation.  Next, dissection was carried out with superiorly, submuscularly, and also we had to do a large dissection inferiorly to allow [place] to put the proper size implant tissue expander in.  We had to dissect inferiorly down almost 3-4 inches more to allow a space in the pocket to be available to measure up to the size of the left breast to improve symmetry tremendously.  After proper hemostasis, I was able then to put a mentor Siltex contour profile saline implant, serial #[id number], 650 mL to expand up to 900 if necessary.  This fit in very nicely in the pocket that was dissected and I was then able to __________ muscle as best as I could and limited on the lower portion grabbing all the muscle I can with some dermis and with 2-0 Monocryl sutures.  Then, a running subcuticular stitch of 3-0 Monocryl.  The implant fit in very nicely.  The port was placed in the right subcutaneous pocket right of the implant.  We drained the area with a #10 fully fluted Blake drain, which was placed in the depths of wound laterally and then secured with 3-0 Prolene.  This fit in very nicely.  I was able to then feel the tissue expander with 150 mL of sterile saline, injected with  tight to get some softness.  I do not want to give more than that today in fear of compromising the skin that was dissected.  The flap was very thin with limited pectoralis major muscle in the area of dissection. Therefore, the wounds were cleansed.  Many, many __________ 4x4s were placed over the skin for security and to protect it, and Hypafix tape just snugly and not tight.  She tolerated all the procedures very well and was taken to recovery in excellent condition.  ESTIMATED BLOOD LOSS:  Less than 50 mL.  COMPLICATIONS:  None.     Odella Aquas. Towanda Malkin, M.D.   ______________________________ Odella Aquas. Towanda Malkin, M.D.    Elie Confer  D:  06/21/2013  T:  06/22/2013  Job:  [id number]

## 2013-06-23 NOTE — Telephone Encounter (Signed)
Pt aware.

## 2013-07-05 ENCOUNTER — Ambulatory Visit (INDEPENDENT_AMBULATORY_CARE_PROVIDER_SITE_OTHER): Payer: Medicare Other | Admitting: Internal Medicine

## 2013-07-05 ENCOUNTER — Encounter: Payer: Self-pay | Admitting: Internal Medicine

## 2013-07-05 VITALS — BP 111/67 | HR 88 | Temp 97.1°F | Ht 65.5 in | Wt 127.6 lb

## 2013-07-05 DIAGNOSIS — Z0000 Encounter for general adult medical examination without abnormal findings: Secondary | ICD-10-CM | POA: Diagnosis not present

## 2013-07-05 DIAGNOSIS — Z23 Encounter for immunization: Secondary | ICD-10-CM

## 2013-07-05 DIAGNOSIS — R209 Unspecified disturbances of skin sensation: Secondary | ICD-10-CM

## 2013-07-05 DIAGNOSIS — Z853 Personal history of malignant neoplasm of breast: Secondary | ICD-10-CM | POA: Diagnosis not present

## 2013-07-05 DIAGNOSIS — R200 Anesthesia of skin: Secondary | ICD-10-CM

## 2013-07-05 DIAGNOSIS — J449 Chronic obstructive pulmonary disease, unspecified: Secondary | ICD-10-CM | POA: Diagnosis not present

## 2013-07-05 NOTE — Assessment & Plan Note (Signed)
Patient with recent surgical revision of her right breast implant. She is following up today for drain removal. Incision covered with Steri-Strips and appears to be clean, dry, and intact. Patient with no complaints of pain, well-controlled with hydrocodone.

## 2013-07-05 NOTE — Progress Notes (Signed)
I saw and evaluated the patient.  I personally confirmed the key portions of Dr. Renaldo Fiddler history and exam and reviewed pertinent patient test results.  The assessment, diagnosis, and plan were formulated together and I agree with the documentation in the resident's note.

## 2013-07-05 NOTE — Assessment & Plan Note (Signed)
Patient asymptomatic. No recent COPD exacerbation. Continue current regimen. Patient did not need refills today.

## 2013-07-05 NOTE — Progress Notes (Signed)
Patient ID: Mary Jenkins, female   DOB: 1956-01-01, 57 y.o.   MRN: [id number] HPI The patient is a 57 y.o. female with a history of COPD stage IV, breast cancer s/p mastectomy 1997, GERD who presents for routine clinic visit today. Patient has no complaints today.  Patient has a history of breast cancer and had recent revision of her right breast reconstruction and removal of her right breast implant with right breast capsulotomy with insertion of tissue expander on 06/21/2013. Patient has only minimal pain at the incision site, which is well controlled on hydrocodone. She's not had fevers, chills, swelling or redness to the area. No swelling of her right arm. Patient reports that her drain is still in place, which will be removed at her followup appointment today.  Patient with history of left shoulder pain, which was injected with cortisone by sports medicine. Patient has experienced significant relief of her shoulder pain after this procedure. No pain today. However, patient complains of some chronic numbness to her right outer arm which is fairly well-controlled on gabapentin 400 mg 3 times a day.  Patient with history of severe COPD (PFTs done in June 2014 showed FEV1/FVC ratio .33), though today denies having any shortness of breath, cough, chest pain either at rest or with exertion. She has not had any COPD exacerbation type symptoms recently.  Patient does not need any medications refilled today.  ROS: General: no fevers, chills, changes in weight, changes in appetite Skin: no rash HEENT: no vision changes, sore throat Pulm: no dyspnea, coughing, wheezing CV: no chest pain, palpitations, shortness of breath Abd: no abdominal pain, nausea/vomiting, diarrhea/constipation GU: no dysuria, hematuria Ext: no arthralgias, myalgias Neuro: See history of present illness  Filed Vitals:   07/05/13 1330  BP: 111/67  Pulse: 88  Temp: 97.1 F (36.2 C)   Physical Exam General: alert,  cooperative, and in no apparent distress HEENT: pupils equal round and reactive to light, vision grossly intact, oropharynx clear and non-erythematous  Neck: supple Lungs: clear to ascultation bilaterally, normal work of respiration, no wheezes, rales, ronchi Heart: regular rate and rhythm, no murmurs, gallops, or rubs Abdomen: thin, soft, non-tender, non-distended, normal bowel sounds Extremities: Warm extremities, no cyanosis, clubbing, or edema Neurologic: alert & oriented X3, cranial nerves II-XII grossly intact, strength grossly intact, sensation intact to light touch  Current Outpatient Prescriptions on File Prior to Visit  Medication Sig Dispense Refill  . albuterol (PROVENTIL) (2.5 MG/3ML) 0.083% nebulizer solution Take 2.5 mg by nebulization every 6 (six) hours as needed for wheezing. For wheezing      . budesonide-formoterol (SYMBICORT) 160-4.5 MCG/ACT inhaler Inhale 2 puffs into the lungs 2 (two) times daily.  1 Inhaler  5  . gabapentin (NEURONTIN) 400 MG capsule Take 1 capsule (400 mg total) by mouth 3 (three) times daily.  90 capsule  1  . pantoprazole (PROTONIX) 40 MG tablet Take 40 mg by mouth daily.      Marland Kitchen tiotropium (SPIRIVA) 18 MCG inhalation capsule Place 1 capsule (18 mcg total) into inhaler and inhale daily.  30 capsule  11  . albuterol (PROVENTIL HFA;VENTOLIN HFA) 108 (90 BASE) MCG/ACT inhaler Inhale 2 puffs into the lungs every 6 (six) hours as needed for wheezing.  1 Inhaler  0   No current facility-administered medications on file prior to visit.    Assessment/Plan

## 2013-07-05 NOTE — Patient Instructions (Addendum)
Thank you for your visit today. We made no changes to your medications today. Please call the clinic if you need refills. Please follow up in our clinic again in 6 months.

## 2013-07-05 NOTE — Assessment & Plan Note (Signed)
Patient has history of cervical radiculopathy with chronic numbness and tingling to her right upper-outer arm. She is on gabapentin 400 mg 3 times a day and patient reports her symptoms are under control with this regimen. No refills needed at this time.

## 2013-07-05 NOTE — Assessment & Plan Note (Addendum)
Patient received flu shot today. She will now be following up every 6 months.

## 2013-07-19 ENCOUNTER — Ambulatory Visit (INDEPENDENT_AMBULATORY_CARE_PROVIDER_SITE_OTHER): Payer: Medicare Other | Admitting: Internal Medicine

## 2013-07-19 ENCOUNTER — Encounter: Payer: Self-pay | Admitting: Internal Medicine

## 2013-07-19 VITALS — BP 102/60 | HR 73 | Temp 97.3°F | Ht 65.5 in | Wt 131.0 lb

## 2013-07-19 DIAGNOSIS — J4489 Other specified chronic obstructive pulmonary disease: Secondary | ICD-10-CM

## 2013-07-19 DIAGNOSIS — J449 Chronic obstructive pulmonary disease, unspecified: Secondary | ICD-10-CM

## 2013-07-19 NOTE — Patient Instructions (Signed)
Plan A = automatic symbicort and spiriva   Plan B = backup Only use your albuterol as a rescue medication to be used if you can't catch your breath by resting or doing a relaxed purse lip breathing pattern.  - The less you use it, the better it will work when you need it. - Ok to use up to every 4 hours if you must but call for immediate appointment if use goes up over your usual need - Don't leave home without it !!  (think of it like your spare tire for your car)   Plan C = nebulizer with albuterol up to 4 hours but call for appointment right me or Tammy NP.   If you are satisfied with your treatment plan let your doctor know and he/she can either refill your medications or you can return here when your prescription runs out.     If in any way you are not 100% satisfied,  please tell us.  If 100% better, tell your friends!

## 2013-07-19 NOTE — Progress Notes (Signed)
Subjective:    Patient ID: Mary Jenkins, female    DOB: 30-May-1956  MRN: [id number]    Brief patient profile:  71 yobm quit smoking  07/24/11 not taking breathing medications at that point and subsequently placed on multiple meds and still sob just getting dressed so referred from the medicine clinic at [facility] 02/13/2012 for pulmonary eval.   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 No real variability, cough is better with no excess or purulent sputum.  Not using saba daytime because finds if she's holding still doesn't need it as oftern. Some better p last  Prednisone rx. Not much variability. rec Stop atrovent and advair Start symbiocort Take 2 puffs first thing in am and then another 2 puffs about 12 hours later.  Take after only am dose Spiriva Protonix 40 mg Take 30-60 min before first meal of the day  GERD diet Only use your albuterol (Plan B= ventolin puffer,  Plan C is nebulizer) as a rescue medication to be used if you can't catch your breath by resting or doing a relaxed purse lip breathing pattern. The less you use it, the better it will work when you need it. Ok to use rescue up to every 4 hours if doing poorly.           03/22/2013 f/u ov/Wert GOLD IV COPD Chief Complaint  Patient presents with  . Follow-up    with PFTs.  Was d/c'd from [facility] last week.  Breathing has improved since d/c but still recovering and building strength.  Has DOE and prod cough with white mucus.  No wheezing, chest tightness, or chest pain at this time.   Plan A = automatic symbicort and spiriva  Plan B = Backup, proventil / proaire up to 2 puffs every 4 hours  Plan C = nebulizer albuterol, only use if can't get relief sitting still and used plan B and if start needing nebulizer call asap to see me or Tammy NP same day. Plan D= Doctor Plan E = Er Work on inhaler technique:  relax and gently blow all the way  out then take a nice smooth deep breath back in, triggering the inhaler at same time you start breathing in.  Hold for up to 5 seconds if you can.  Rinse and gargle with water when done      04/19/2013 f/u ov/Wert re copd GOLD IV Chief Complaint  Patient presents with  . 4 wk follow up    Breathing has improved.  Does note DOE and occas chest pain.  No wheezing or cough at this time.  Not really limited by breathing while on symbiocort/ spiriva and rare need for saba hfa, never neb now  Cp is fleeting s pleuritic or exertional components.  rec No change in rx   06/21/13 R reconstruction by Georgia Lopes  07/19/2013 f/u ov/Wert re: GOLD IV COPD Chief Complaint  Patient presents with  . Follow-up    Pt states her breathing is doing well. CP has resolved. No new co's today.   nor limited by sob from desired activities, no need for any saba at all   No obvious daytime variabilty or assoc chronic cough or cp or chest tightness, subjective wheeze overt sinus or hb symptoms. No unusual exp hx or h/o childhood pna/ asthma or premature birth to his knowledge.     Sleeping ok without nocturnal  or early am exacerbation  of respiratory  c/o's or need for noct saba. Also denies any obvious fluctuation of symptoms with weather or environmental changes or other aggravating or alleviating factors except as outlined above.  Current Medications, Allergies, Past Medical History, Past Surgical History, Family History, and Social History were reviewed in [employer] record.  ROS  The following are not active complaints unless bolded sore throat, dysphagia, dental problems, itching, sneezing,  nasal congestion or excess/ purulent secretions, ear ache,   fever, chills, sweats, unintended wt loss, pleuritic or exertional cp, hemoptysis,  orthopnea pnd or leg swelling, presyncope, palpitations, heartburn, abdominal pain, anorexia, nausea, vomiting, diarrhea  or change in bowel or urinary  habits, change in stools or urine, dysuria,hematuria,  rash, arthralgias, visual complaints, headache, numbness weakness or ataxia or problems with walking or coordination,  change in mood/affect or memory.             Objective:   Physical Exam  amb thin wf nad with nl vital signs  Wt 120 02/13/2012  > 119 03/13/2012 > 04/22/2012 117 > 07/21/2012 122> 12/02/2012  131> 03/22/2013  131 > 04/19/2013  129  > 07/19/2013 131  HEENT mild turbinate edema.  Oropharynx edentulous no excess pnd or cobblestoning. Mild thrush under lower dentures.  No JVD or cervical adenopathy. Mild accessory muscle hypertrophy. Trachea midline, nl thryroid. Chest was hyperinflated by percussion with diminished breath sounds and moderate increased exp time without wheeze. Hoover sign positive at mid inspiration. Regular rate and rhythm without murmur gallop or rub or increase P2 or edema.  Abd: no hsm, nl excursion. Ext warm without cyanosis or clubbing.      03/13/13 cxr Stable COPD. No acute findings.     Assessment & Plan:

## 2013-07-19 NOTE — Assessment & Plan Note (Signed)
-  FEV1 0.39 ( 16%) ratio 23 and 16% better p B2    - 03/13/2012  Inhaler technique 90% with dpi but only 50% with mdi > 50% 04/22/12   - Alpha one genotype 02/13/12 > MM   - 03/22/2013  PFT's 0.56 ( 24%) ratio 33 but 12% better improvement and DLCO 21 corrects to 28%  -  hfa 90% 04/19/2013 p extensive coaching   I had an extended  Summary discussion with the patient today lasting 15 to 20 minutes of a 25 minute visit on the following issues:   Despite severity of dz, she is extremely well compensated 1 years sp smoking cessation (which was emphasized as key to her progress so far) and not needing any rescue with no tendency to aecopd    Each maintenance medication was reviewed in detail including most importantly the difference between maintenance and as needed and under what circumstances the prns are to be used.  Please see instructions for details which were reviewed in writing and the patient given a copy.    Pulmonary f/u can be prn - discussed the 3-5 day rule of thumb for any exac > ov asap in event rescue rx increases

## 2013-07-27 ENCOUNTER — Telehealth: Payer: Self-pay | Admitting: Internal Medicine

## 2013-07-27 MED ORDER — OMEPRAZOLE 40 MG PO CPDR: DELAYED_RELEASE_CAPSULE | ORAL | Status: DC

## 2013-07-27 NOTE — Telephone Encounter (Signed)
I spoke with pt. She received a form from [employer] stating the pantoprazole in not on her formulary. She was not giving an alternative. Dr. Melvyn Novas please advise what pt can be changed to? thanks

## 2013-07-27 NOTE — Telephone Encounter (Signed)
Pt aware of recs and rx has been sent

## 2013-07-27 NOTE — Telephone Encounter (Signed)
omeprazole 40 Take 30-60 min before first meal of the day

## 2013-08-06 ENCOUNTER — Ambulatory Visit (INDEPENDENT_AMBULATORY_CARE_PROVIDER_SITE_OTHER): Payer: Medicare Other | Admitting: Obstetrics

## 2013-08-06 ENCOUNTER — Encounter: Payer: Self-pay | Admitting: Obstetrics

## 2013-08-06 VITALS — BP 111/75 | HR 83 | Temp 98.0°F | Ht 65.0 in | Wt 136.0 lb

## 2013-08-06 DIAGNOSIS — Z124 Encounter for screening for malignant neoplasm of cervix: Secondary | ICD-10-CM

## 2013-08-06 DIAGNOSIS — R350 Frequency of micturition: Secondary | ICD-10-CM

## 2013-08-06 DIAGNOSIS — N766 Ulceration of vulva: Secondary | ICD-10-CM

## 2013-08-06 DIAGNOSIS — N760 Acute vaginitis: Secondary | ICD-10-CM | POA: Diagnosis not present

## 2013-08-06 DIAGNOSIS — IMO0001 Reserved for inherently not codable concepts without codable children: Secondary | ICD-10-CM

## 2013-08-06 LAB — POCT URINALYSIS DIPSTICK
Nitrite, UA: NEGATIVE
Protein, UA: NEGATIVE
Urobilinogen, UA: NEGATIVE
pH, UA: 7

## 2013-08-06 NOTE — Progress Notes (Signed)
Subjective:     Mary Jenkins is a 57 y.o. female here for a problem visit and pap smear.  Current complaints: bump on inner labia.  Personal health questionnaire reviewed: yes.   Gynecologic History No LMP recorded. Patient is postmenopausal. Contraception: abstinence Last Pap: uknown. Results were: hx of abnormal pap smears Last mammogram: 2013. Results were: normal  Obstetric History OB History  No data available     The following portions of the patient's history were reviewed and updated as appropriate: allergies, current medications, past family history, past medical history, past social history, past surgical history and problem list.  Review of Systems Pertinent items are noted in HPI.    Objective:    General appearance: alert and no distress Abdomen: normal findings: soft, non-tender Pelvic: cervix normal in appearance, no adnexal masses or tenderness, no cervical motion tenderness, rectovaginal septum normal, uterus normal size, shape, and consistency, vagina normal without discharge and left labial erosion noted.  Herpes culture done.    Assessment:    Healthy female exam.   R/O Genital Herpes.   Plan:    Education reviewed: Herpes presentation . Follow up in: 2 weeks.

## 2013-08-07 LAB — WET PREP BY MOLECULAR PROBE: Gardnerella vaginalis: NEGATIVE

## 2013-08-09 LAB — PAP IG W/ RFLX HPV ASCU

## 2013-08-09 LAB — HERPES SIMPLEX VIRUS CULTURE: Organism ID, Bacteria: DETECTED

## 2013-08-13 ENCOUNTER — Telehealth: Payer: Self-pay | Admitting: Internal Medicine

## 2013-08-13 MED ORDER — OMEPRAZOLE 40 MG PO CPDR: 40.0000 mg | DELAYED_RELEASE_CAPSULE | Freq: Every day | ORAL | Status: DC

## 2013-08-13 NOTE — Telephone Encounter (Signed)
Called and spoke with pt and she is aware of MW recs to change the protonix to omeprazole 40 mg.  rx has been sent in and pt is aware.

## 2013-08-13 NOTE — Telephone Encounter (Signed)
Omeprazole 40 Take 30-60 min before first meal of the day

## 2013-08-13 NOTE — Telephone Encounter (Signed)
Dr. Melvyn Novas please advise alternative to the protonix? thanks

## 2013-08-18 ENCOUNTER — Other Ambulatory Visit: Payer: Self-pay | Admitting: *Deleted

## 2013-08-18 MED ORDER — GABAPENTIN 400 MG PO CAPS: 400.0000 mg | ORAL_CAPSULE | Freq: Three times a day (TID) | ORAL | Status: DC

## 2013-08-20 ENCOUNTER — Telehealth: Payer: Self-pay | Admitting: Internal Medicine

## 2013-08-20 ENCOUNTER — Other Ambulatory Visit: Payer: Self-pay | Admitting: Internal Medicine

## 2013-08-20 NOTE — Telephone Encounter (Signed)
Insurance is no longer covering the cost of Omeprazole Pt has a 30day supply to use right now, will need new Rx for an alternative med. Rx for new meds will need to be sent to pharmacy and placed on HOLD  Dr Melvyn Novas please advise any alternatives for pt. Thanks.

## 2013-08-20 NOTE — Telephone Encounter (Signed)
Try pantoprazole 40 mg Take 30-60 min before first meal of the day

## 2013-08-20 NOTE — Telephone Encounter (Signed)
I called and made pt aware. Nothing further needed

## 2013-08-24 ENCOUNTER — Encounter: Payer: Self-pay | Admitting: Obstetrics

## 2013-08-24 ENCOUNTER — Ambulatory Visit (INDEPENDENT_AMBULATORY_CARE_PROVIDER_SITE_OTHER): Payer: Medicare Other | Admitting: Obstetrics

## 2013-08-24 VITALS — BP 128/75 | HR 97 | Temp 97.8°F | Ht 65.0 in | Wt 140.0 lb

## 2013-08-24 DIAGNOSIS — A6000 Herpesviral infection of urogenital system, unspecified: Secondary | ICD-10-CM

## 2013-08-24 NOTE — Progress Notes (Signed)
Pt in office for follow up visit on recent lab results, denies any complaints or problems at this time  Herpes Culture:  Positive, but not able to detect Type.  This was discussed with patient.  All questions answered.  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  Instructed to return early for re-culture of any future lesions to get better culture.

## 2013-09-08 ENCOUNTER — Telehealth: Payer: Self-pay | Admitting: Internal Medicine

## 2013-09-08 MED ORDER — OMEPRAZOLE 20 MG PO CPDR: 20.0000 mg | DELAYED_RELEASE_CAPSULE | Freq: Two times a day (BID) | ORAL | Status: DC

## 2013-09-08 NOTE — Telephone Encounter (Signed)
I spoke with pt and is aware of recs. Nothing further needed

## 2013-09-08 NOTE — Telephone Encounter (Signed)
I spoke with pt. She reports the omperazole 20 mg and she can do up to 2 daily. Please advise if okay to change RX mw THANKS

## 2013-09-08 NOTE — Telephone Encounter (Signed)
Fine with me

## 2013-09-08 NOTE — Telephone Encounter (Signed)
I called and spoke with pt. We have already called in pantoprazole and omeprazole. She will call her insurance to see what they will cover. Nothing further needed

## 2013-10-13 ENCOUNTER — Other Ambulatory Visit: Payer: Self-pay | Admitting: *Deleted

## 2013-10-13 MED ORDER — GABAPENTIN 400 MG PO CAPS: 400.0000 mg | ORAL_CAPSULE | Freq: Three times a day (TID) | ORAL | Status: DC

## 2013-10-13 NOTE — Telephone Encounter (Signed)
Double request

## 2013-10-18 DIAGNOSIS — C50919 Malignant neoplasm of unspecified site of unspecified female breast: Secondary | ICD-10-CM | POA: Diagnosis not present

## 2013-10-22 ENCOUNTER — Ambulatory Visit (INDEPENDENT_AMBULATORY_CARE_PROVIDER_SITE_OTHER): Payer: Medicare Other | Admitting: Internal Medicine

## 2013-10-22 ENCOUNTER — Encounter: Payer: Self-pay | Admitting: Internal Medicine

## 2013-10-22 VITALS — BP 102/72 | HR 112 | Temp 97.3°F | Wt 131.9 lb

## 2013-10-22 DIAGNOSIS — G44209 Tension-type headache, unspecified, not intractable: Secondary | ICD-10-CM | POA: Diagnosis not present

## 2013-10-22 DIAGNOSIS — R42 Dizziness and giddiness: Secondary | ICD-10-CM | POA: Insufficient documentation

## 2013-10-22 NOTE — Patient Instructions (Addendum)
1. Please take Ibuprofen or Naproxen as needed for your Tension headaches. If ibuprofen is used your can take 661m-800mg up to three times per day if needed.  If you use Naproxen you can take (4432mnaproxen sodium) twice per day if needed. Do not use both these meds.  Use one or the other.  You can also try massaging the neck or applying heat or ice (wrapped in a towel) if that improves your symptoms.  2. Try to avoid quick position changes that make you feel dizzy.  If you continue to feel dizzy or have dizziness at rest please return to clinic.   2. Please take all medications as prescribed.    3. If you have worsening of your symptoms or new symptoms arise, please call the clinic [phone] or go to the ER immediately if symptoms are severe.  4. Return to clinic on January 10, 2014 for follow-up with your PCP.  Tension Headache A tension headache is a feeling of pain, pressure, or aching often felt over the front and sides of the head. The pain can be dull or can feel tight (constricting). It is the most common type of headache. Tension headaches are not normally associated with nausea or vomiting and do not get worse with physical activity. Tension headaches can last 30 minutes to several days.  CAUSES  The exact cause is not known, but it may be caused by chemicals and hormones in the brain that lead to pain. Tension headaches often begin after stress, anxiety, or depression. Other triggers may include:  Alcohol.  Caffeine (too much or withdrawal).  Respiratory infections (colds, flu, sinus infections).  Dental problems or teeth clenching.  Fatigue.  Holding your head and neck in one position too long while using a computer. SYMPTOMS   Pressure around the head.   Dull, aching head pain.   Pain felt over the front and sides of the head.   Tenderness in the muscles of the head, neck, and shoulders. DIAGNOSIS  A tension headache is often diagnosed based on:   Symptoms.    Physical examination.   A CT scan or MRI of your head. These tests may be ordered if symptoms are severe or unusual. TREATMENT  Medicines may be given to help relieve symptoms.  HOME CARE INSTRUCTIONS   Only take over-the-counter or prescription medicines for pain or discomfort as directed by your caregiver.   Lie down in a dark, quiet room when you have a headache.   Keep a journal to find out what may be triggering your headaches. For example, write down:  What you eat and drink.  How much sleep you get.  Any change to your diet or medicines.  Try massage or other relaxation techniques.   Ice packs or heat applied to the head and neck can be used. Use these 3 to 4 times per day for 15 to 20 minutes each time, or as needed.   Limit stress.   Sit up straight, and do not tense your muscles.   Quit smoking if you smoke.  Limit alcohol use.  Decrease the amount of caffeine you drink, or stop drinking caffeine.  Eat and exercise regularly.  Get 7 to 9 hours of sleep, or as recommended by your caregiver.  Avoid excessive use of pain medicine as recurrent headaches can occur.  SEEK MEDICAL CARE IF:   You have problems with the medicines you were prescribed.  Your medicines do not work.  You have a change  from the usual headache.  You have nausea or vomiting. SEEK IMMEDIATE MEDICAL CARE IF:   Your headache becomes severe.  You have a fever.  You have a stiff neck.  You have loss of vision.  You have muscular weakness or loss of muscle control.  You lose your balance or have trouble walking.  You feel faint or pass out.  You have severe symptoms that are different from your first symptoms. MAKE SURE YOU:   Understand these instructions.  Will watch your condition.  Will get help right away if you are not doing well or get worse. Document Released: 09/09/2005 Document Revised: 12/02/2011 Document Reviewed: 08/30/2011 [facility] Patient  Information 2014 [place], [place].

## 2013-10-22 NOTE — Assessment & Plan Note (Signed)
No neurologic deficits, coordination and gait intact so doubt posterior circulation CVA or dissection.  Seems positional as she only experiences it with squatting to standing.  Advised her to avoid quick position changes.  Advised to return to clinic if it persists or occurs at rest.

## 2013-10-22 NOTE — Progress Notes (Signed)
Subjective:    Patient ID: Mary Jenkins, female    DOB: July 22, 1956, 58 y.o.   MRN: [id number]  HPI Comments: Mary Jenkins is a 58 year old woman with a PMH COPD (Gold 4), GERD, cervical radiculopathy and breast CA (s/p revision 05/2013) who presents with headache and dizziness x 2 month.  Lower occipital headache, 10/10 at worst but more often 5/10  intermittent for past month.  She describes it as "nagging" but not pulsating or stabbing   Not worst ever.  curs daily at least once per day lasting about an hour.  Not associated with time of day or eating.  No preceding trauma or injury.  No change or loss of vision, no N/V, no URI symptoms.  No caffeine, smoking or drug use.  Sleep helps it.  Has not tried OTCs.  Never felt this prior to 2 months ago.   The dizziness does not seem associated with the headache.  Dizziness occurs with squatting to rising (i.e. Tying shoes).  Has history of cervical radiculopathy and breast CA surgery with numbness in right arm.  No new numbness/tingling.  No pre-syncope or LOC.  No new medications.          Review of Systems  Constitutional: Negative for appetite change.  Eyes: Negative for visual disturbance.  Respiratory: Negative for shortness of breath.   Cardiovascular:       Atypical sharp pain beneath left breast occurred a few times lasting about 2 minutes each.  Has not felt in over 3 weeks.  Gastrointestinal: Negative for diarrhea, constipation and blood in stool.  Genitourinary: Negative for dysuria and hematuria.       Objective:   Physical Exam  Vitals reviewed. Constitutional: She is oriented to person, place, and time. She appears well-developed. No distress.  HENT:  Mouth/Throat: Oropharynx is clear and moist. No oropharyngeal exudate.  Eyes: EOM are normal. Pupils are equal, round, and reactive to light.  Neck: Normal range of motion. Neck supple.  Cardiovascular: Normal rate, regular rhythm and normal heart sounds.   Negative  orthostatic vitals.  Pulmonary/Chest: Effort normal and breath sounds normal. No respiratory distress. She has no wheezes. She has no rales.  Abdominal: Soft. Bowel sounds are normal. She exhibits no distension. There is no tenderness.  Musculoskeletal:  No tenderness on palpation of occiput or cervical paraspinal musculature.  Spine non-tender.  Lymphadenopathy:    She has no cervical adenopathy.  Neurological: She is alert and oriented to person, place, and time. No cranial nerve deficit. She exhibits normal muscle tone. Coordination normal.  5/5 motor strength upper and lower extremities; intact finger to nose, rapid hand movement, heel to shin; no pronator drift  Skin: Skin is warm. She is not diaphoretic.  Psychiatric: She has a normal mood and affect. Her behavior is normal.          Assessment & Plan:  Please see problem based assessment and plan.

## 2013-10-22 NOTE — Assessment & Plan Note (Addendum)
No neurologic findings to suggest acute intracranial pathology.  No neck stiffness, fever or altered mentation to suggest meningitis.  Features not unilateral or occuring at a specific time to suggest cluster.    Plan:  1) OTC NSAID or Tylenol           2) heat, ice (wrapped in towel) or massage as needed and tolerated           3) Return for follow-up with PCP in three months

## 2013-10-23 NOTE — Progress Notes (Signed)
Case discussed with Dr. Redmond Pulling at the time of the visit.  We reviewed the resident's history and exam and pertinent patient test results.  I agree with the assessment, diagnosis, and plan of care documented in the resident's note.

## 2013-11-10 DIAGNOSIS — C50919 Malignant neoplasm of unspecified site of unspecified female breast: Secondary | ICD-10-CM | POA: Diagnosis not present

## 2013-11-23 ENCOUNTER — Encounter (HOSPITAL_BASED_OUTPATIENT_CLINIC_OR_DEPARTMENT_OTHER): Payer: Self-pay | Admitting: *Deleted

## 2013-11-23 NOTE — Progress Notes (Signed)
No labs needed-stable copd-sees dr wert-here for surgery 9/14 did well

## 2013-11-24 DIAGNOSIS — C50919 Malignant neoplasm of unspecified site of unspecified female breast: Secondary | ICD-10-CM | POA: Diagnosis not present

## 2013-11-28 ENCOUNTER — Other Ambulatory Visit: Payer: Self-pay | Admitting: Internal Medicine

## 2013-11-29 ENCOUNTER — Ambulatory Visit (HOSPITAL_BASED_OUTPATIENT_CLINIC_OR_DEPARTMENT_OTHER): Payer: Medicare Other | Admitting: Anesthesiology

## 2013-11-29 ENCOUNTER — Ambulatory Visit (HOSPITAL_BASED_OUTPATIENT_CLINIC_OR_DEPARTMENT_OTHER)
Admission: RE | Admit: 2013-11-29 | Discharge: 2013-11-29 | Disposition: A | Discharge status: Home / Self Care | Payer: Medicare Other | Source: Ambulatory Visit | Attending: Specialist | Admitting: Specialist

## 2013-11-29 ENCOUNTER — Encounter (HOSPITAL_BASED_OUTPATIENT_CLINIC_OR_DEPARTMENT_OTHER): Payer: Self-pay

## 2013-11-29 ENCOUNTER — Encounter (HOSPITAL_BASED_OUTPATIENT_CLINIC_OR_DEPARTMENT_OTHER): Payer: Medicare Other | Admitting: Anesthesiology

## 2013-11-29 ENCOUNTER — Encounter (HOSPITAL_BASED_OUTPATIENT_CLINIC_OR_DEPARTMENT_OTHER)
Admission: RE | Disposition: A | Discharge status: Home / Self Care | Payer: Self-pay | Source: Ambulatory Visit | Attending: Specialist

## 2013-11-29 ENCOUNTER — Telehealth: Payer: Self-pay | Admitting: Internal Medicine

## 2013-11-29 DIAGNOSIS — Z9221 Personal history of antineoplastic chemotherapy: Secondary | ICD-10-CM | POA: Insufficient documentation

## 2013-11-29 DIAGNOSIS — C50919 Malignant neoplasm of unspecified site of unspecified female breast: Secondary | ICD-10-CM | POA: Diagnosis not present

## 2013-11-29 DIAGNOSIS — Z87891 Personal history of nicotine dependence: Secondary | ICD-10-CM | POA: Insufficient documentation

## 2013-11-29 DIAGNOSIS — K219 Gastro-esophageal reflux disease without esophagitis: Secondary | ICD-10-CM | POA: Insufficient documentation

## 2013-11-29 DIAGNOSIS — D649 Anemia, unspecified: Secondary | ICD-10-CM | POA: Insufficient documentation

## 2013-11-29 DIAGNOSIS — Z886 Allergy status to analgesic agent status: Secondary | ICD-10-CM | POA: Insufficient documentation

## 2013-11-29 DIAGNOSIS — R0602 Shortness of breath: Secondary | ICD-10-CM | POA: Insufficient documentation

## 2013-11-29 DIAGNOSIS — F1420 Cocaine dependence, uncomplicated: Secondary | ICD-10-CM | POA: Insufficient documentation

## 2013-11-29 DIAGNOSIS — J438 Other emphysema: Secondary | ICD-10-CM | POA: Insufficient documentation

## 2013-11-29 DIAGNOSIS — Z853 Personal history of malignant neoplasm of breast: Secondary | ICD-10-CM | POA: Insufficient documentation

## 2013-11-29 DIAGNOSIS — L089 Local infection of the skin and subcutaneous tissue, unspecified: Secondary | ICD-10-CM | POA: Diagnosis not present

## 2013-11-29 HISTORY — PX: BREAST RECONSTRUCTION: SHX9

## 2013-11-29 LAB — POCT HEMOGLOBIN-HEMACUE: HEMOGLOBIN: 11.9 g/dL — AB (ref 12.0–15.0)

## 2013-11-29 SURGERY — RECONSTRUCTION, BREAST
Anesthesia: General | Site: Breast | Laterality: Left

## 2013-11-29 MED ORDER — CEFAZOLIN SODIUM-DEXTROSE 2-3 GM-% IV SOLR
2.0000 g | INTRAVENOUS | Status: AC
  Administered 2013-11-29: 2 g via INTRAVENOUS

## 2013-11-29 MED ORDER — ONDANSETRON HCL 4 MG/2ML IJ SOLN
INTRAMUSCULAR | Status: DC | PRN
  Administered 2013-11-29: 4 mg via INTRAVENOUS

## 2013-11-29 MED ORDER — LACTATED RINGERS IV SOLN
INTRAVENOUS | Status: DC
  Administered 2013-11-29: 07:00:00 via INTRAVENOUS

## 2013-11-29 MED ORDER — EPHEDRINE SULFATE 50 MG/ML IJ SOLN
INTRAMUSCULAR | Status: DC | PRN
  Administered 2013-11-29 (×2): 10 mg via INTRAVENOUS

## 2013-11-29 MED ORDER — CEFAZOLIN SODIUM-DEXTROSE 2-3 GM-% IV SOLR
INTRAVENOUS | Status: AC
  Filled 2013-11-29: qty 50

## 2013-11-29 MED ORDER — LIDOCAINE HCL (CARDIAC) 20 MG/ML IV SOLN
INTRAVENOUS | Status: DC | PRN
  Administered 2013-11-29: 100 mg via INTRAVENOUS

## 2013-11-29 MED ORDER — FENTANYL CITRATE 0.05 MG/ML IJ SOLN
INTRAMUSCULAR | Status: AC
  Filled 2013-11-29: qty 6

## 2013-11-29 MED ORDER — GLYCOPYRROLATE 0.2 MG/ML IJ SOLN
INTRAMUSCULAR | Status: DC | PRN
  Administered 2013-11-29: .7 mg via INTRAVENOUS

## 2013-11-29 MED ORDER — HYDROMORPHONE HCL PF 1 MG/ML IJ SOLN: 0.2500 mg | INTRAMUSCULAR | Status: DC | PRN

## 2013-11-29 MED ORDER — LIDOCAINE-EPINEPHRINE 0.5 %-1:200000 IJ SOLN
INTRAMUSCULAR | Status: AC
  Filled 2013-11-29: qty 2

## 2013-11-29 MED ORDER — MIDAZOLAM HCL 2 MG/2ML IJ SOLN
INTRAMUSCULAR | Status: AC
  Filled 2013-11-29: qty 2

## 2013-11-29 MED ORDER — MIDAZOLAM HCL 5 MG/5ML IJ SOLN
INTRAMUSCULAR | Status: DC | PRN
  Administered 2013-11-29: 2 mg via INTRAVENOUS

## 2013-11-29 MED ORDER — OXYCODONE HCL 5 MG PO TABS
ORAL_TABLET | ORAL | Status: AC
  Filled 2013-11-29: qty 1

## 2013-11-29 MED ORDER — NEOSTIGMINE METHYLSULFATE 1 MG/ML IJ SOLN
INTRAMUSCULAR | Status: DC | PRN
  Administered 2013-11-29: 4 mg via INTRAVENOUS

## 2013-11-29 MED ORDER — SODIUM CHLORIDE 0.9 % IV SOLN
INTRAVENOUS | Status: DC | PRN
  Administered 2013-11-29: 08:00:00 via INTRAMUSCULAR

## 2013-11-29 MED ORDER — FENTANYL CITRATE 0.05 MG/ML IJ SOLN: 50.0000 ug | INTRAMUSCULAR | Status: DC | PRN

## 2013-11-29 MED ORDER — FENTANYL CITRATE 0.05 MG/ML IJ SOLN
INTRAMUSCULAR | Status: DC | PRN
  Administered 2013-11-29: 100 ug via INTRAVENOUS

## 2013-11-29 MED ORDER — MIDAZOLAM HCL 2 MG/2ML IJ SOLN: 1.0000 mg | INTRAMUSCULAR | Status: DC | PRN

## 2013-11-29 MED ORDER — ROCURONIUM BROMIDE 100 MG/10ML IV SOLN
INTRAVENOUS | Status: DC | PRN
  Administered 2013-11-29: 50 mg via INTRAVENOUS

## 2013-11-29 MED ORDER — OXYCODONE HCL 5 MG PO TABS
5.0000 mg | ORAL_TABLET | Freq: Once | ORAL | Status: AC | PRN
  Administered 2013-11-29: 5 mg via ORAL

## 2013-11-29 MED ORDER — PROMETHAZINE HCL 25 MG/ML IJ SOLN: 6.2500 mg | INTRAMUSCULAR | Status: DC | PRN

## 2013-11-29 MED ORDER — OXYCODONE HCL 5 MG/5ML PO SOLN: 5.0000 mg | Freq: Once | ORAL | Status: AC | PRN

## 2013-11-29 MED ORDER — PROPOFOL 10 MG/ML IV BOLUS
INTRAVENOUS | Status: DC | PRN
  Administered 2013-11-29: 200 mg via INTRAVENOUS

## 2013-11-29 MED ORDER — DEXAMETHASONE SODIUM PHOSPHATE 4 MG/ML IJ SOLN
INTRAMUSCULAR | Status: DC | PRN
  Administered 2013-11-29: 10 mg via INTRAVENOUS

## 2013-11-29 SURGICAL SUPPLY — 64 items
APL SKNCLS STERI-STRIP NONHPOA (GAUZE/BANDAGES/DRESSINGS) ×1
BAG DECANTER FOR FLEXI CONT (MISCELLANEOUS) ×3 IMPLANT
BENZOIN TINCTURE PRP APPL 2/3 (GAUZE/BANDAGES/DRESSINGS) ×3 IMPLANT
BINDER BREAST MEDIUM (GAUZE/BANDAGES/DRESSINGS) ×2 IMPLANT
BLADE KNIFE PERSONA 10 (BLADE) ×3 IMPLANT
BLADE KNIFE PERSONA 15 (BLADE) ×3 IMPLANT
CANISTER SUCT 1200ML W/VALVE (MISCELLANEOUS) ×3 IMPLANT
COVER MAYO STAND STRL (DRAPES) ×3 IMPLANT
COVER TABLE BACK 60X90 (DRAPES) ×3 IMPLANT
DECANTER SPIKE VIAL GLASS SM (MISCELLANEOUS) ×3 IMPLANT
DRAIN CHANNEL 10M FLAT 3/4 FLT (DRAIN) ×3 IMPLANT
DRAIN PENROSE 1/4X12 LTX STRL (WOUND CARE) IMPLANT
DRAPE LAPAROSCOPIC ABDOMINAL (DRAPES) ×3 IMPLANT
ELECT BLADE 6.5 .24CM SHAFT (ELECTRODE) IMPLANT
ELECT REM PT RETURN 9FT ADLT (ELECTROSURGICAL) ×3
ELECTRODE REM PT RTRN 9FT ADLT (ELECTROSURGICAL) ×1 IMPLANT
EVACUATOR SILICONE 100CC (DRAIN) ×3 IMPLANT
FILTER 7/8 IN (FILTER) ×3 IMPLANT
GAUZE XEROFORM 5X9 LF (GAUZE/BANDAGES/DRESSINGS) ×3 IMPLANT
GLOVE BIO SURGEON STRL SZ 6.5 (GLOVE) ×2 IMPLANT
GLOVE BIO SURGEONS STRL SZ 6.5 (GLOVE) ×1
GLOVE BIOGEL M STRL SZ7.5 (GLOVE) ×3 IMPLANT
GLOVE BIOGEL PI IND STRL 8 (GLOVE) ×1 IMPLANT
GLOVE BIOGEL PI INDICATOR 8 (GLOVE) ×2
GLOVE ECLIPSE 7.0 STRL STRAW (GLOVE) ×3 IMPLANT
GOWN STRL REUS W/ TWL LRG LVL3 (GOWN DISPOSABLE) IMPLANT
GOWN STRL REUS W/ TWL XL LVL3 (GOWN DISPOSABLE) ×1 IMPLANT
GOWN STRL REUS W/TWL LRG LVL3 (GOWN DISPOSABLE)
GOWN STRL REUS W/TWL XL LVL3 (GOWN DISPOSABLE) ×6
IV NS 500ML (IV SOLUTION) ×12
IV NS 500ML BAXH (IV SOLUTION) ×4 IMPLANT
KIT FILL SYSTEM UNIVERSAL (SET/KITS/TRAYS/PACK) ×1 IMPLANT
NDL HYPO 25X1 1.5 SAFETY (NEEDLE) IMPLANT
NEEDLE HYPO 25X1 1.5 SAFETY (NEEDLE) IMPLANT
NEEDLE SPNL 18GX3.5 QUINCKE PK (NEEDLE) IMPLANT
NS IRRIG 1000ML POUR BTL (IV SOLUTION) ×1 IMPLANT
PACK BASIN DAY SURGERY FS (CUSTOM PROCEDURE TRAY) ×3 IMPLANT
PAD ABD 8X10 STRL (GAUZE/BANDAGES/DRESSINGS) ×6 IMPLANT
PEN SKIN MARKING BROAD TIP (MISCELLANEOUS) ×3 IMPLANT
PIN SAFETY STERILE (MISCELLANEOUS) IMPLANT
SLEEVE SCD COMPRESS KNEE MED (MISCELLANEOUS) ×3 IMPLANT
SPONGE GAUZE 4X4 12PLY (GAUZE/BANDAGES/DRESSINGS) ×3 IMPLANT
SPONGE LAP 18X18 X RAY DECT (DISPOSABLE) ×6 IMPLANT
STRIP SUTURE WOUND CLOSURE 1/2 (SUTURE) ×4 IMPLANT
SUT ETHILON 3 0 PS 1 (SUTURE) ×2 IMPLANT
SUT MNCRL AB 3-0 PS2 18 (SUTURE) ×3 IMPLANT
SUT MON AB 2-0 CT1 36 (SUTURE) ×3 IMPLANT
SUT MON AB 5-0 PS2 18 (SUTURE) IMPLANT
SUT PROLENE 3 0 PS 2 (SUTURE) IMPLANT
SYR 20CC LL (SYRINGE) IMPLANT
SYR 50ML LL SCALE MARK (SYRINGE) ×6 IMPLANT
SYR BULB IRRIGATION 50ML (SYRINGE) IMPLANT
SYR CONTROL 10ML LL (SYRINGE) ×1 IMPLANT
TAPE HYPAFIX 6 X30' (GAUZE/BANDAGES/DRESSINGS) ×1
TAPE HYPAFIX 6X30 (GAUZE/BANDAGES/DRESSINGS) ×2 IMPLANT
TAPE MEASURE 72IN RETRACT (INSTRUMENTS)
TAPE MEASURE LINEN 72IN RETRCT (INSTRUMENTS) IMPLANT
TOWEL OR 17X24 6PK STRL BLUE (TOWEL DISPOSABLE) ×12 IMPLANT
TRAY DSU PREP LF (CUSTOM PROCEDURE TRAY) ×3 IMPLANT
TUBE CONNECTING 20'X1/4 (TUBING) ×1
TUBE CONNECTING 20X1/4 (TUBING) ×2 IMPLANT
UNDERPAD 30X30 INCONTINENT (UNDERPADS AND DIAPERS) ×6 IMPLANT
VAC PENCILS W/TUBING CLEAR (MISCELLANEOUS) ×3 IMPLANT
YANKAUER SUCT BULB TIP NO VENT (SUCTIONS) ×3 IMPLANT

## 2013-11-29 NOTE — Brief Op Note (Signed)
11/29/2013  8:55 AM  PATIENT:  Mary Jenkins  58 y.o. female  PRE-OPERATIVE DIAGNOSIS:  Malignant neoplasm of breast (female), unspecified site [174.9]  POST-OPERATIVE DIAGNOSIS:  Malignant neoplasm of breast (female), unspecified site [174.9  PROCEDURE:  Procedure(s): LEFT MASTOPEXY FOR RECONSTRUCTION (Left)  SURGEON:  Surgeon(s) and Role:    * Cristine Polio, MD - Primary  PHYSICIAN ASSISTANT:   ASSISTANTS: none   ANESTHESIA:   general  EBL:  Total I/O In: 1000 [I.V.:1000] Out: -   BLOOD ADMINISTERED:none  DRAINS: (left lateral chest) Jackson-Pratt drain(s) with closed bulb suction in the left lateral chest   LOCAL MEDICATIONS USED:  LIDOCAINE   SPECIMEN:  Excision  DISPOSITION OF SPECIMEN:  PATHOLOGY  COUNTS:  YES  TOURNIQUET:  * No tourniquets in log *  DICTATION: .Other Dictation: Dictation Number [id number]  PLAN OF CARE: Discharge to home after PACU  PATIENT DISPOSITION:  PACU - hemodynamically stable.   Delay start of Pharmacological VTE agent (>24hrs) due to surgical blood loss or risk of bleeding: yes

## 2013-11-29 NOTE — H&P (Signed)
Mary Jenkins is an 58 y.o. female.   Chief Complaint: Right breast cancer left breast ptosis HPI: Hx of right mastectomy and reconstruction by Dr Harlow Mares She underwent revision and redo on the right side  Left ptosis  Past Medical History  Diagnosis Date  . Personal history of chemotherapy 1997  . Insomnia 06/24/2011  . GERD (gastroesophageal reflux disease) 02/03/2012  . 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  Separate hypoattenuating lesions within the liver, favored to represent small cysts or complex cyst. Too small to entirely characterize.   CT abdomen/pelvis 11/2011. 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 2. Also unchanged is a 7 mm low attenuation lesion in segment 8 of  the liver, which is too small to characterize, but is favored to represent a small cyst or biliary hamartoma. 3. Small amount of pneumobilia in the left lobe of the liver, as above, new compared to the prior examination 06/14/2011. This is of uncertain etiology and significance, but is a common finding in patients who have had prior sphincterotomy (clinical correlation is recommended).     . COPD (chronic obstructive pulmonary disease) 06/24/2011  . Emphysema   . Exertional shortness of breath   . DDD (degenerative disc disease), cervical   . Wears glasses   . Full dentures   . Breast cancer 1997    chemo-no PAC  . Anemia     Past Surgical History  Procedure Laterality Date  . Tubal ligation  1980's  . Orif wrist fracture      lt-age 34  . Colonoscopy    . Breast biopsy Left 1997  . Breast surgery  97    implant  . Mastectomy, partial Right 1997    -node dissection  . Breast capsulotomy with implant exchange Right 06/21/2013   Procedure: REVISION RIGHT BREAST RECONSTRUCTION/REMOVAL OF RIGHT IMPLANT/RIGHT BREAST CAPSULOTOMY WITH INSERT TISSUE EXPLANDER RIGHT BREAST/POSSIBLE LEFT BREAST MASTOPEXY;  Surgeon: Cristine Polio, MD;  Location: [facility];  Service: Plastics;  Laterality: Right;  . Breast reconstruction Right 06/21/2013    Procedure: BREAST RECONSTRUCTION;  Surgeon: Cristine Polio, MD;  Location: [facility];  Service: Plastics;  Laterality: Right;    Family History  Problem Relation Age of Onset  . Hypertension Sister   . Colon cancer Neg Hx   . Esophageal cancer Neg Hx   . Stomach cancer Neg Hx   . Heart disease Brother   . Heart disease Brother   . Heart disease Brother   . Hypertension Mother    Social History:  reports that she quit smoking about 16 months ago. Her smoking use included Cigarettes. She has a 205 pack-year smoking history. She has never used smokeless tobacco. She reports that she does not drink alcohol or use illicit drugs.  Allergies:  Allergies  Allergen Reactions  . Aspirin Other (See Comments)    Pt does not remember reaction    Medications Prior to Admission  Medication Sig Dispense Refill  . albuterol (PROVENTIL) (2.5 MG/3ML) 0.083% nebulizer solution Take 2.5 mg by nebulization every 6 (six) hours as needed for wheezing. For wheezing      . budesonide-formoterol (SYMBICORT) 160-4.5 MCG/ACT inhaler Inhale 2 puffs into the  lungs 2 (two) times daily.  1 Inhaler  5  . famotidine (PEPCID) 10 MG tablet Take 10 mg by mouth at bedtime.      . gabapentin (NEURONTIN) 400 MG capsule Take 400 mg by mouth 3 (three) times daily.      Marland Kitchen omeprazole (PRILOSEC) 20 MG capsule Take 1 capsule (20 mg total) by mouth 2 (two) times daily.  60 capsule  6  . tiotropium (SPIRIVA) 18 MCG inhalation capsule Place 1 capsule (18 mcg total) into inhaler and inhale daily.  30 capsule  11  . albuterol (PROVENTIL HFA;VENTOLIN HFA) 108 (90 BASE) MCG/ACT inhaler Inhale 2  puffs into the lungs every 6 (six) hours as needed for wheezing.  1 Inhaler  0    No results found for this or any previous visit (from the past 48 hour(s)). No results found.  Review of Systems  Constitutional: Negative.   HENT: Negative.   Eyes: Negative.   Respiratory: Negative.   Cardiovascular: Negative.   Gastrointestinal: Negative.   Genitourinary: Negative.   Musculoskeletal: Negative.   Skin: Negative.   Neurological: Negative.   Endo/Heme/Allergies: Negative.   Psychiatric/Behavioral: Negative.     Blood pressure 116/78, pulse 84, temperature 97.4 F (36.3 C), temperature source Oral, resp. rate 20, height 5' 6" (1.676 m), weight 58.968 kg (130 lb), SpO2 97.00%. Physical Exam   Assessment/Plan SP revision right breast with ptosis on the left  For left ptosis  reconstruction  Ladarius Seubert L 11/29/2013, 7:27 AM

## 2013-11-29 NOTE — Transfer of Care (Signed)
Immediate Anesthesia Transfer of Care Note  Patient: Mary Jenkins  Procedure(s) Performed: Procedure(s): LEFT MASTOPEXY FOR RECONSTRUCTION (Left)  Patient Location: PACU  Anesthesia Type:General  Level of Consciousness: awake and sedated  Airway & Oxygen Therapy: Patient Spontanous Breathing and Patient connected to face mask oxygen  Post-op Assessment: Report given to PACU RN and Post -op Vital signs reviewed and stable  Post vital signs: Reviewed and stable  Complications: No apparent anesthesia complications

## 2013-11-29 NOTE — Anesthesia Postprocedure Evaluation (Signed)
  Anesthesia Post-op Note  Patient: Mary Jenkins  Procedure(s) Performed: Procedure(s): LEFT MASTOPEXY FOR RECONSTRUCTION (Left)  Patient Location: PACU  Anesthesia Type:General  Level of Consciousness: awake and alert   Airway and Oxygen Therapy: Patient Spontanous Breathing  Post-op Pain: mild  Post-op Assessment: Post-op Vital signs reviewed, Patient's Cardiovascular Status Stable, Respiratory Function Stable, Patent Airway, No signs of Nausea or vomiting, Adequate PO intake and Pain level controlled  Post-op Vital Signs: Reviewed and stable  Complications: No apparent anesthesia complications

## 2013-11-29 NOTE — Anesthesia Preprocedure Evaluation (Addendum)
Anesthesia Evaluation  Patient identified by MRN, date of birth, ID band Patient awake    Reviewed: Allergy & Precautions, H&P , NPO status , Patient's Chart, lab work & pertinent test results  Airway Mallampati: I TM Distance: >3 FB Neck ROM: Full    Dental   Pulmonary shortness of breath, COPDformer smoker,  + rhonchi   + decreased breath sounds      Cardiovascular Rhythm:Regular Rate:Normal     Neuro/Psych  Headaches,  Neuromuscular disease    GI/Hepatic GERD-  ,(+)     substance abuse  cocaine use,   Endo/Other    Renal/GU      Musculoskeletal   Abdominal   Peds  Hematology   Anesthesia Other Findings Breast ca  Reproductive/Obstetrics                          Anesthesia Physical Anesthesia Plan  ASA: III  Anesthesia Plan: General   Post-op Pain Management:    Induction: Intravenous  Airway Management Planned: Oral ETT  Additional Equipment:   Intra-op Plan:   Post-operative Plan:   Informed Consent: I have reviewed the patients History and Physical, chart, labs and discussed the procedure including the risks, benefits and alternatives for the proposed anesthesia with the patient or authorized representative who has indicated his/her understanding and acceptance.     Plan Discussed with: CRNA and Surgeon  Anesthesia Plan Comments:         Anesthesia Quick Evaluation

## 2013-11-29 NOTE — Anesthesia Procedure Notes (Signed)
Procedure Name: Intubation Performed by: Terrance Mass Pre-anesthesia Checklist: Patient identified, Emergency Drugs available, Suction available, Patient being monitored and Timeout performed Patient Re-evaluated:Patient Re-evaluated prior to inductionOxygen Delivery Method: Circle system utilized Preoxygenation: Pre-oxygenation with 100% oxygen Intubation Type: IV induction Ventilation: Mask ventilation without difficulty Laryngoscope Size: Miller and 2 Grade View: Grade I Tube type: Oral Tube size: 7.0 mm Number of attempts: 1 Airway Equipment and Method: Stylet Placement Confirmation: ETT inserted through vocal cords under direct vision,  breath sounds checked- equal and bilateral and positive ETCO2 Secured at: 22 cm Tube secured with: Tape Dental Injury: Teeth and Oropharynx as per pre-operative assessment

## 2013-11-29 NOTE — Discharge Instructions (Signed)
Activity (include date of return to work if known) As tolerated: NO showers NO driving No heavy activities  Diet:regular No restrictions:  Wound Care: Keep dressing clean & dry  Do not change dressings For Abdominoplasties wear abdominal binder Special Instructions: Do not raise arms up Continue to empty, recharge, & record drainage from J-P drains &/or Hemovacs 2-3 times a day, as needed. Call Doctor if any unusual problems occur such as pain, excessive Bleeding, unrelieved Nausea/vomiting, Fever &/or chills When lying down, keep head elevated on 2-3 pillows or back-rest For Addominoplasties the Jack-knife position Follow-up appointment: Please call the office.  The patient received discharge instruction from:___________________________________________      Patient signature ________________________________________ / Date___________    Signature of individual providing instructions/ Date________________              About my Jackson-Pratt Bulb Drain  What is a Jackson-Pratt bulb? A Jackson-Pratt is a soft, round device used to collect drainage. It is connected to a long, thin drainage catheter, which is held in place by one or two small stiches near your surgical incision site. When the bulb is squeezed, it forms a vacuum, forcing the drainage to empty into the bulb.  Emptying the Jackson-Pratt bulb- To empty the bulb: 1. Release the plug on the top of the bulb. 2. Pour the bulb's contents into a measuring container which your nurse will provide. 3. Record the time emptied and amount of drainage. Empty the drain(s) as often as your     doctor or nurse recommends.  Date                  Time                    Amount (Drain 1)                 Amount (Drain  2)  _____________________________________________________________________  _____________________________________________________________________  _____________________________________________________________________  _____________________________________________________________________  _____________________________________________________________________  _____________________________________________________________________  _____________________________________________________________________  _____________________________________________________________________  Squeezing the Jackson-Pratt Bulb- To squeeze the bulb: 1. Make sure the plug at the top of the bulb is open. 2. Squeeze the bulb tightly in your fist. You will hear air squeezing from the bulb. 3. Replace the plug while the bulb is squeezed. 4. Use a safety pin to attach the bulb to your clothing. This will keep the catheter from     pulling at the bulb insertion site.  When to call your doctor- Call your doctor if:  Drain site becomes red, swollen or hot.  You have a fever greater than 101 degrees F.  There is oozing at the drain site.  Drain falls out (apply a guaze bandage over the drain hole and secure it with tape).  Drainage increases daily not related to activity patterns. (You will usually have more drainage when you are active than when you are resting.)  Drainage has a bad odor.   Post Anesthesia Home Care Instructions  Activity: Get plenty of rest for the remainder of the day. A responsible adult should stay with you for 24 hours following the procedure.  For the next 24 hours, DO NOT: -Drive a car -Paediatric nurse -Drink alcoholic beverages -Take any medication unless instructed by your physician -Make any legal decisions or sign important papers.  Meals: Start with liquid foods such as gelatin or soup. Progress to regular foods as tolerated. Avoid greasy, spicy, heavy foods. If nausea  and/or vomiting occur, drink only clear liquids until the nausea and/or  vomiting subsides. Call your physician if vomiting continues.  Special Instructions/Symptoms: Your throat may feel dry or sore from the anesthesia or the breathing tube placed in your throat during surgery. If this causes discomfort, gargle with warm salt water. The discomfort should disappear within 24 hours.

## 2013-11-29 NOTE — Telephone Encounter (Signed)
i called and spoke with pt. Advised her since she did not need to see MW any longer refill needs to come from PCP. Nothing further needed

## 2013-11-30 NOTE — Op Note (Deleted)
NAMECLARK, CLOWDUS                ACCOUNT NO.:  [id number]  MEDICAL RECORD NO.:  [id number]  LOCATION:                               FACILITY:  [facility]  PHYSICIAN:  Berneta Sages L. Lizeth Bencosme, M.D.DATE OF BIRTH:  1956-09-17  DATE OF PROCEDURE:  11/29/2013 DATE OF DISCHARGE:  11/29/2013                              OPERATIVE REPORT   This is a 58 year old lady with a right breast cancer, had previous reconstruction, was done by Dr. Harlow Mares had been redone with tissue expansion, is doing well from that, but now has ptosis about her left breast, which will make it very asymmetrical once the reconstruction on the right side is done.  PROCEDURES PLANNED:  Left mastopexy.  ANESTHESIA:  General.  Preoperatively, the patient was sat up and drawn of the left mastopexy, remarked the nipple-areolar complexes up to 20 cm from the suprasternal notch in a superior lift fashion with lollipop and lower incision midportion at the inframammary line.  After this, she underwent general anesthesia, intubated orally.  Prep was done to the chest, breast areas in routine fashion both sides using Hibiclens soap and solution, walled off with sterile towels and drapes so as to make a sterile field.  A 1/3% Xylocaine was then injected locally.  A 1:300,000 concentration of total of 100 mL bolused down to each area.  This was allowed to set up. The wounds were scored with #15 blade and the skin over the inferior pedicle was de-epithelialized at superior pedicle.  Next, the flaps were freed and elevated.  Hemostasis was maintained with the Bovie anticoagulation.  Flaps then rotated and the nipple-areolar complex was fitted to new location and secured with 2-0 Monocryl suture subcutaneously and then the skin edges reapproximated with running subcuticular stitches of 3-0 Monocryl throughout the inverted T area laterally.  A #10 fully-fluted Blake drain was placed in the depths of the wound and brought out through the  lateral portion of the incision secured with 3-0 Prolene.  At the end of the procedure, the nipple- areolar complex were examined showing excellent blood supply and suppleness. Steri-Strips and soft dressing were applied including Xeroform, 4x4s, ABDs, Hypafix tape.  She tolerated the procedures very well.  ESTIMATED BLOOD LOSS:  Less than 100 mL.  COMPLICATIONS:  None.     Odella Aquas. Towanda Malkin, M.D.     Elie Confer  D:  11/29/2013  T:  11/30/2013  Job:  [id number]

## 2013-11-30 NOTE — Op Note (Signed)
NAMECLARK, CLOWDUS                ACCOUNT NO.:  [id number]  MEDICAL RECORD NO.:  [id number]  LOCATION:                               FACILITY:  [facility]  PHYSICIAN:  Berneta Sages L. Mostyn Varnell, M.D.DATE OF BIRTH:  1956-09-17  DATE OF PROCEDURE:  11/29/2013 DATE OF DISCHARGE:  11/29/2013                              OPERATIVE REPORT   This is a 58 year old lady with a right breast cancer, had previous reconstruction, was done by Dr. Harlow Mares had been redone with tissue expansion, is doing well from that, but now has ptosis about her left breast, which will make it very asymmetrical once the reconstruction on the right side is done.  PROCEDURES PLANNED:  Left mastopexy.  ANESTHESIA:  General.  Preoperatively, the patient was sat up and drawn of the left mastopexy, remarked the nipple-areolar complexes up to 20 cm from the suprasternal notch in a superior lift fashion with lollipop and lower incision midportion at the inframammary line.  After this, she underwent general anesthesia, intubated orally.  Prep was done to the chest, breast areas in routine fashion both sides using Hibiclens soap and solution, walled off with sterile towels and drapes so as to make a sterile field.  A 1/3% Xylocaine was then injected locally.  A 1:300,000 concentration of total of 100 mL bolused down to each area.  This was allowed to set up. The wounds were scored with #15 blade and the skin over the inferior pedicle was de-epithelialized at superior pedicle.  Next, the flaps were freed and elevated.  Hemostasis was maintained with the Bovie anticoagulation.  Flaps then rotated and the nipple-areolar complex was fitted to new location and secured with 2-0 Monocryl suture subcutaneously and then the skin edges reapproximated with running subcuticular stitches of 3-0 Monocryl throughout the inverted T area laterally.  A #10 fully-fluted Blake drain was placed in the depths of the wound and brought out through the  lateral portion of the incision secured with 3-0 Prolene.  At the end of the procedure, the nipple- areolar complex were examined showing excellent blood supply and suppleness. Steri-Strips and soft dressing were applied including Xeroform, 4x4s, ABDs, Hypafix tape.  She tolerated the procedures very well.  ESTIMATED BLOOD LOSS:  Less than 100 mL.  COMPLICATIONS:  None.     Odella Aquas. Towanda Malkin, M.D.     Elie Confer  D:  11/29/2013  T:  11/30/2013  Job:  [id number]

## 2013-12-01 ENCOUNTER — Encounter (HOSPITAL_BASED_OUTPATIENT_CLINIC_OR_DEPARTMENT_OTHER): Payer: Self-pay | Admitting: Specialist

## 2013-12-20 ENCOUNTER — Other Ambulatory Visit: Payer: Self-pay | Admitting: *Deleted

## 2013-12-20 MED ORDER — GABAPENTIN 400 MG PO CAPS: 400.0000 mg | ORAL_CAPSULE | Freq: Three times a day (TID) | ORAL | Status: DC

## 2013-12-28 ENCOUNTER — Other Ambulatory Visit: Payer: Self-pay | Admitting: *Deleted

## 2013-12-28 MED ORDER — BUDESONIDE-FORMOTEROL FUMARATE 160-4.5 MCG/ACT IN AERO: 2.0000 | INHALATION_SPRAY | Freq: Two times a day (BID) | RESPIRATORY_TRACT | Status: DC

## 2014-01-10 ENCOUNTER — Encounter: Payer: Medicaid Other | Admitting: Internal Medicine

## 2014-01-17 ENCOUNTER — Ambulatory Visit (INDEPENDENT_AMBULATORY_CARE_PROVIDER_SITE_OTHER): Payer: Medicare Other | Admitting: Internal Medicine

## 2014-01-17 ENCOUNTER — Encounter: Payer: Self-pay | Admitting: Internal Medicine

## 2014-01-17 VITALS — BP 118/72 | HR 80 | Temp 97.2°F | Wt 131.0 lb

## 2014-01-17 DIAGNOSIS — G44209 Tension-type headache, unspecified, not intractable: Secondary | ICD-10-CM | POA: Diagnosis not present

## 2014-01-17 DIAGNOSIS — R200 Anesthesia of skin: Secondary | ICD-10-CM

## 2014-01-17 DIAGNOSIS — R42 Dizziness and giddiness: Secondary | ICD-10-CM | POA: Diagnosis not present

## 2014-01-17 DIAGNOSIS — J449 Chronic obstructive pulmonary disease, unspecified: Secondary | ICD-10-CM | POA: Diagnosis not present

## 2014-01-17 DIAGNOSIS — D179 Benign lipomatous neoplasm, unspecified: Secondary | ICD-10-CM | POA: Diagnosis not present

## 2014-01-17 DIAGNOSIS — R202 Paresthesia of skin: Principal | ICD-10-CM

## 2014-01-17 DIAGNOSIS — M5412 Radiculopathy, cervical region: Secondary | ICD-10-CM

## 2014-01-17 DIAGNOSIS — R229 Localized swelling, mass and lump, unspecified: Secondary | ICD-10-CM

## 2014-01-17 DIAGNOSIS — J4489 Other specified chronic obstructive pulmonary disease: Secondary | ICD-10-CM

## 2014-01-17 MED ORDER — ALBUTEROL SULFATE HFA 108 (90 BASE) MCG/ACT IN AERS: 2.0000 | INHALATION_SPRAY | Freq: Four times a day (QID) | RESPIRATORY_TRACT | Status: DC | PRN

## 2014-01-17 NOTE — Assessment & Plan Note (Signed)
Patient has new L sided neck pain w/ radiculopathy. She has h/o of known degenerative changes per CT scan in 2009, but given her new onset of symptoms I believe repeat imaging would be beneficial. No weakness to L arm, though there is significant pain as well as subjectively altered sensation to L lateral arm. Patient was asked to get an MRI C spine within the next few days. I also encouraged her to take ibuprofen 400-656m TID for 2 weeks with a follow up visit in clinic in 2 weeks. She can continue taking her gabapentin as prescribed as well.
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
## 2014-01-17 NOTE — Assessment & Plan Note (Signed)
Currently stable. No need for changes to management. Refilled albuterol inhaler.

## 2014-01-17 NOTE — Assessment & Plan Note (Signed)
Patient with stable soft, mobile subcutaneous nodule to R forearm- most likely a lipoma as was previously thought by Dr. Owens Shark in 2014. However, the episodes of pain and acute increase in size of the nodule that she experiences is atypical for lipoma. I am unsure of what else this could represent. Possibly a ganglion cyst, though not a typical picture for this either. Not likely to be an abscess as no TTP erythema or fluctuance. I asked patient to take the ibuprofen and continue her gabapentin as prescribed for her cervical radiculopathy. She agrees. We may need to work this up further if patient continues to describe episodes of pain/swelling to this area.

## 2014-01-17 NOTE — Patient Instructions (Signed)
Thank you for your visit.  Please take ibuprofen 400-620m three times per day for the next 2 weeks.   Please obtain an MRI of your neck today/tomorrow.  Please follow up in 2 weeks.

## 2014-01-18 NOTE — Progress Notes (Signed)
INTERNAL MEDICINE TEACHING ATTENDING ADDENDUM - Aldine Contes, MD: I reviewed and discussed at the time of visit with the resident Dr. Mechele Claude, the patient's medical history, physical examination, diagnosis and results of tests and treatment and I agree with the patient's care as documented.

## 2014-02-21 ENCOUNTER — Telehealth: Payer: Self-pay | Admitting: Internal Medicine

## 2014-02-21 NOTE — Telephone Encounter (Signed)
Pt returned call

## 2014-02-21 NOTE — Telephone Encounter (Signed)
[id number]. [place], CMA

## 2014-02-21 NOTE — Telephone Encounter (Signed)
lmtcb x1 

## 2014-02-22 ENCOUNTER — Other Ambulatory Visit: Payer: Self-pay | Admitting: *Deleted

## 2014-02-22 MED ORDER — GABAPENTIN 400 MG PO CAPS: 400.0000 mg | ORAL_CAPSULE | Freq: Three times a day (TID) | ORAL | Status: DC

## 2014-02-22 NOTE — Telephone Encounter (Signed)
Spoke with the pt and notified of recs per MW  She verbalized understanding and nothing further needed

## 2014-02-22 NOTE — Telephone Encounter (Signed)
Spoke with pt Requesting approval to fly Pt does not have a current trip planned - is just wanting general clearance to fly.  Pt aware that MW out of office until Thursday. Please advise Dr Melvyn Novas. Thanks.

## 2014-02-22 NOTE — Telephone Encounter (Signed)
Probably ok based on last ov but that was 9 months ago so if wants my official approval needs ov before she makes specific plans so we can go over some basic restrictions re travel after a current assessment of her copd status

## 2014-02-23 DIAGNOSIS — C50919 Malignant neoplasm of unspecified site of unspecified female breast: Secondary | ICD-10-CM | POA: Diagnosis not present

## 2014-03-02 ENCOUNTER — Other Ambulatory Visit: Payer: Self-pay | Admitting: *Deleted

## 2014-03-02 MED ORDER — BUDESONIDE-FORMOTEROL FUMARATE 160-4.5 MCG/ACT IN AERO: 2.0000 | INHALATION_SPRAY | Freq: Two times a day (BID) | RESPIRATORY_TRACT | Status: DC

## 2014-03-07 ENCOUNTER — Telehealth: Payer: Self-pay | Admitting: Internal Medicine

## 2014-03-07 ENCOUNTER — Encounter: Payer: Self-pay | Admitting: Internal Medicine

## 2014-03-07 ENCOUNTER — Ambulatory Visit (INDEPENDENT_AMBULATORY_CARE_PROVIDER_SITE_OTHER): Payer: Medicare Other | Admitting: Internal Medicine

## 2014-03-07 VITALS — BP 107/61 | HR 90 | Temp 97.4°F | Wt 127.5 lb

## 2014-03-07 DIAGNOSIS — Z9010 Acquired absence of unspecified breast and nipple: Secondary | ICD-10-CM

## 2014-03-07 DIAGNOSIS — E875 Hyperkalemia: Secondary | ICD-10-CM | POA: Diagnosis not present

## 2014-03-07 DIAGNOSIS — K219 Gastro-esophageal reflux disease without esophagitis: Secondary | ICD-10-CM

## 2014-03-07 DIAGNOSIS — M542 Cervicalgia: Secondary | ICD-10-CM | POA: Diagnosis not present

## 2014-03-07 DIAGNOSIS — Z0000 Encounter for general adult medical examination without abnormal findings: Secondary | ICD-10-CM | POA: Diagnosis not present

## 2014-03-07 DIAGNOSIS — J449 Chronic obstructive pulmonary disease, unspecified: Secondary | ICD-10-CM

## 2014-03-07 DIAGNOSIS — M5412 Radiculopathy, cervical region: Secondary | ICD-10-CM | POA: Diagnosis not present

## 2014-03-07 DIAGNOSIS — J4489 Other specified chronic obstructive pulmonary disease: Secondary | ICD-10-CM | POA: Diagnosis not present

## 2014-03-07 DIAGNOSIS — Z853 Personal history of malignant neoplasm of breast: Secondary | ICD-10-CM

## 2014-03-07 DIAGNOSIS — R209 Unspecified disturbances of skin sensation: Secondary | ICD-10-CM | POA: Diagnosis not present

## 2014-03-07 LAB — COMPLETE METABOLIC PANEL WITH GFR
ALK PHOS: 82 U/L (ref 39–117)
ALT: 9 U/L (ref 0–35)
AST: 17 U/L (ref 0–37)
Albumin: 4.2 g/dL (ref 3.5–5.2)
BILIRUBIN TOTAL: 0.3 mg/dL (ref 0.2–1.2)
BUN: 14 mg/dL (ref 6–23)
CO2: 28 mEq/L (ref 19–32)
CREATININE: 0.9 mg/dL (ref 0.50–1.10)
Calcium: 9.5 mg/dL (ref 8.4–10.5)
Chloride: 104 mEq/L (ref 96–112)
GFR, Est African American: 82 mL/min
GFR, Est Non African American: 71 mL/min
Glucose, Bld: 87 mg/dL (ref 70–99)
Potassium: 5.5 mEq/L — ABNORMAL HIGH (ref 3.5–5.3)
Sodium: 140 mEq/L (ref 135–145)
Total Protein: 6.8 g/dL (ref 6.0–8.3)

## 2014-03-07 LAB — CBC
HEMATOCRIT: 41.3 % (ref 36.0–46.0)
Hemoglobin: 13.3 g/dL (ref 12.0–15.0)
MCH: 25.6 pg — AB (ref 26.0–34.0)
MCHC: 32.2 g/dL (ref 30.0–36.0)
MCV: 79.4 fL (ref 78.0–100.0)
Platelets: 313 10*3/uL (ref 150–400)
RBC: 5.2 MIL/uL — ABNORMAL HIGH (ref 3.87–5.11)
RDW: 16.8 % — AB (ref 11.5–15.5)
WBC: 6 10*3/uL (ref 4.0–10.5)

## 2014-03-07 MED ORDER — MELOXICAM 7.5 MG PO TABS: 7.5000 mg | ORAL_TABLET | Freq: Every day | ORAL | Status: DC

## 2014-03-07 MED ORDER — OMEPRAZOLE 20 MG PO CPDR: 20.0000 mg | DELAYED_RELEASE_CAPSULE | Freq: Two times a day (BID) | ORAL | Status: DC

## 2014-03-07 MED ORDER — CYCLOBENZAPRINE HCL 5 MG PO TABS: 5.0000 mg | ORAL_TABLET | Freq: Every evening | ORAL | Status: DC | PRN

## 2014-03-07 NOTE — Assessment & Plan Note (Addendum)
Assessment: Pt with right breast cancer s/p right mastectomy in 1997 with revision of right breast reconstruction and removal of right breast implant with right breast capsulotomy and insertion of tissue expander on 06/21/2013, scheduled for nipple reconstruction surgery in 1 week, who presents with no concern for reoccurance.   Plan: -Obtain CBC (last one 03/12/13) --> normal  -Continue to monitor

## 2014-03-07 NOTE — Telephone Encounter (Signed)
Letter completed.  Placed up front to pick up. Pt aware

## 2014-03-07 NOTE — Telephone Encounter (Signed)
Pt calling requesting a letter stating that she is unable to work d/t to her COPD. Pt states that she has not talked to her PCP about getting a letter yet because she wanted to check with Dr Melvyn Novas first since she has COPD. Please advise Dr Melvyn Novas. Thanks.

## 2014-03-07 NOTE — Progress Notes (Signed)
Patient ID: Mary Jenkins, female   DOB: 1956-09-09, 58 y.o.   MRN: [id number]    Subjective:   Patient ID: Mary Jenkins female   DOB: 05-May-1956 58 y.o.   MRN: [id number]  HPI: [name] is a 58 y.o. very pleasant woman with past medical history of COPD Gold IV, breast cancer s/p mastectomy in 1997 with revision in 2014, DJD, and GERD who presents with chief complaint of neck pain with radiation to left arm.   She reports that for almost 1 year she has been having neck pain with radiation down her left arm. She was seen by Sports Medicine August 2014 for left shoulder pain thought to be due to rotator cuff tendinitis/tedinopathy and was treated with subacromial cortisone injection which temporarily relieved the pain. At last visit in April she was to receive MRI of her neck however due to insurance was unable to obtain it. She reports that since last visit she continues to have neck pain with left upper extremity "burning" pain, numbness/tingling in her left fingertips (that is positional in nature), and mild left arm/hand weakness. She denies recent trauma, injury, overuse, or fall. She stopped using OTC NSAID's because she was told it was not good for her kidneys. She takes gabapentin 400 mg TID which helps with neuropathy. She occassionally has headaches but denies vertigo, bladder/bowel incontinence, LE weakness/paraestheaiss, or gait instability. She also reports chronic low back pain that is no worse than normal.   Her COPD has been stable with no recent exacerbations. She has chronic dry cough that is at baseline. She is compliant with taking daily spiriva and symbicort with occasional rescue inhaler use with exertional activities.    She is s/p right mastectomy in 1997 with revision in 2014. She reports that in 1 week she will have nipple reconstruction surgery. She denies breast pain, lymph node enlargement/pain, or right UE swelling.     Past Medical History  Diagnosis Date  .  Personal history of chemotherapy 1997  . Insomnia 06/24/2011  . GERD (gastroesophageal reflux disease) 02/03/2012  . 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  Separate hypoattenuating lesions within the liver, favored to represent small cysts or complex cyst. Too small to entirely characterize.   CT abdomen/pelvis 11/2011. 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 2. Also unchanged is a 7 mm low attenuation lesion in segment 8 of  the liver, which is too small to characterize, but is favored to represent a small cyst or biliary hamartoma. 3. Small amount of pneumobilia in the left lobe of the liver, as above, new compared to the prior examination 06/14/2011. This is of uncertain etiology and significance, but is a common finding in patients who have had prior sphincterotomy (clinical correlation is recommended).     . COPD (chronic obstructive pulmonary disease) 06/24/2011  . Emphysema   . Exertional shortness of breath   . DDD (degenerative disc disease), cervical   . Wears glasses   . Full dentures   . Breast cancer 1997    chemo-no PAC  . Anemia    Current Outpatient Prescriptions  Medication Sig Dispense Refill  . albuterol (PROVENTIL HFA;VENTOLIN HFA) 108 (90 BASE) MCG/ACT inhaler Inhale  2 puffs into the lungs every 6 (six) hours as needed for wheezing.  1 Inhaler  1  . albuterol (PROVENTIL) (2.5 MG/3ML) 0.083% nebulizer solution Take 2.5 mg by nebulization every 6 (six) hours as needed for wheezing. For wheezing      . budesonide-formoterol (SYMBICORT) 160-4.5 MCG/ACT inhaler Inhale 2 puffs into the lungs 2 (two) times daily.  3 Inhaler  1  . famotidine (PEPCID) 10 MG tablet Take 10 mg by mouth at bedtime.      .  gabapentin (NEURONTIN) 400 MG capsule Take 1 capsule (400 mg total) by mouth 3 (three) times daily.  90 capsule  1  . omeprazole (PRILOSEC) 20 MG capsule Take 1 capsule (20 mg total) by mouth 2 (two) times daily.  60 capsule  6  . tiotropium (SPIRIVA) 18 MCG inhalation capsule Place 1 capsule (18 mcg total) into inhaler and inhale daily.  30 capsule  11   No current facility-administered medications for this visit.   Family History  Problem Relation Age of Onset  . Hypertension Sister   . Colon cancer Neg Hx   . Esophageal cancer Neg Hx   . Stomach cancer Neg Hx   . Heart disease Brother   . Heart disease Brother   . Heart disease Brother   . Hypertension Mother    History   Social History  . Marital Status: Single    Spouse Name: N/A    Number of Children: N/A  . Years of Education: N/A   Social History Main Topics  . Smoking status: Former Smoker -- 5.00 packs/day for 41 years    Types: Cigarettes    Quit date: 07/23/2012  . Smokeless tobacco: Never Used  . Alcohol Use: No  . Drug Use: No     Comment: I6/20/2014 "n the past cocaine abuse; quit before I quit smoking in 06/2012"  . Sexual Activity: None   Other Topics Concern  . None   Social History Narrative  . None   Review of Systems: Review of Systems  Constitutional: Negative for fever, chills, weight loss and malaise/fatigue.  HENT: Negative for congestion and sore throat.   Eyes: Negative for blurred vision.  Respiratory: Positive for cough (dry cough at baseline). Negative for shortness of breath and wheezing.   Cardiovascular: Negative for chest pain, palpitations and leg swelling.  Gastrointestinal: Negative for nausea, vomiting, abdominal pain, diarrhea, constipation and blood in stool.  Genitourinary: Negative for dysuria, urgency, frequency and hematuria.  Musculoskeletal: Positive for back pain (chronic low back pain at baseline) and neck pain (chronic neck pain at baseline).  Neurological: Positive  for sensory change (left fingertips). Negative for dizziness, focal weakness, weakness and headaches.    Objective:  Physical Exam: Filed Vitals:   03/07/14 0947  BP: 107/61  Pulse: 90  Temp: 97.4 F (36.3 C)  TempSrc: Oral  Weight: 127 lb 8 oz (57.834 kg)  SpO2: 92%    Physical Exam  Constitutional: She is oriented to person, place, and time. She appears well-developed and well-nourished. No distress.  HENT:  Head: Normocephalic and atraumatic.  Right Ear: External ear normal.  Left Ear: External ear normal.  Nose: Nose normal.  Mouth/Throat: Oropharynx is clear and moist. No oropharyngeal exudate.  Eyes: Conjunctivae and EOM are normal. Pupils are equal, round, and reactive to light. Right eye exhibits no discharge. Left eye exhibits no discharge. No scleral icterus.  Neck: Normal range of motion. Neck supple.  Cardiovascular: Normal rate, regular rhythm and normal  heart sounds.   Pulmonary/Chest: Effort normal and breath sounds normal. No respiratory distress. She has no wheezes. She has no rales. She exhibits no tenderness.  Abdominal: Soft. Bowel sounds are normal. She exhibits no distension. There is no tenderness. There is no rebound and no guarding.  Musculoskeletal: Normal range of motion. She exhibits no edema and no tenderness.  Cervical paraspinal tenderness Normal cervical ROM Mildly reduced 4/5 left UE strength Normal sensation to light touch   Neurological: She is alert and oriented to person, place, and time. She displays normal reflexes. No cranial nerve deficit. She exhibits normal muscle tone. Coordination normal.  Skin: Skin is warm and dry. No rash noted. She is not diaphoretic. No erythema. No pallor.  Psychiatric: She has a normal mood and affect. Her behavior is normal. Judgment and thought content normal.    Assessment & Plan:  Please see problem list for problem-based assessment and plan

## 2014-03-07 NOTE — Telephone Encounter (Signed)
She has GOLD IV copd and should easity qualify for full disability and in meantime ok to give her a work note stating only capable of deskwork from home effective her last ov or whenever she last worked

## 2014-03-07 NOTE — Patient Instructions (Addendum)
-  Use Meloxicam 7.78m daily for pain -Use flexiril 5 mg as needed at bedtime for muscle spasms since it can make you drowsy - do not drive or operate  -Will get an MRI of your neck -Will refer you to sports medicine  -Will check your labs today -Will see you back in 2 months - pleasure meeting you!  General Instructions:   Please try to bring all your medicines next time. This will help u[place] you safe from mistakes.   Progress Toward Treatment Goals:  No flowsheet data found.  Self Care Goals & Plans:  No flowsheet data found.  No flowsheet data found.   Care Management & Community Referrals:  No flowsheet data found.        Cervical Radiculopathy Cervical radiculopathy means a nerve in the neck is pinched or bruised. This can cause pain or loss of feeling (numbness) that runs from your neck to your arm and fingers. HOME CARE   Put ice on the injured or painful area.  Put ice in a plastic bag.  Place a towel between your skin and the bag.  Leave the ice on for 15-20 minutes, 03-04 times a day, or as told by your doctor.  If ice does not help, you can try using heat. Take a warm shower or bath, or use a hot water bottle as told by your doctor.  You may try a gentle neck and shoulder massage.  Use a flat pillow when you sleep.  Only take medicines as told by your doctor.  Keep all physical therapy visits as told by your doctor.  If you are given a soft collar, wear it as told by your doctor. GET HELP RIGHT AWAY IF:   Your pain gets worse and is not controlled with medicine.  You lose feeling or feel weak in your hand, arm, face, or leg.  You have a fever or stiff neck.  You cannot control when you poop or pee (incontinence).  You have trouble with walking, balance, or speaking. MAKE SURE YOU:   Understand these instructions.  Will watch your condition.  Will get help right away if you are not doing well or get worse. Document Released:  08/29/2011 Document Revised: 12/02/2011 Document Reviewed: 08/29/2011 E[facility] WasecaPatient Information 2014 [place]

## 2014-03-07 NOTE — Assessment & Plan Note (Addendum)
Assessment: Pt with Gold Stage IV COPD compliant with LABA/ICS and anti-cholinergic maintenance therapy who presents with no recent exacerbations.   Plan: -Continue spiriva 18 mcg daily -Continue symbicort 160-4.5 mcg 2 puffs BID -Continue albuterol inhaler or nebulizer Q 6 hr PRN acute bronchospasm

## 2014-03-07 NOTE — Assessment & Plan Note (Signed)
Assessment: Pt with well-controlled acid reflux disease compliant with dual therapy (H2 blocker and PPI) who presents with no alarm symptoms.   Plan:  -Continue famotidine 10 mg daily at bedtime -Continue omeprazole 20 mg BID -Monitor for alarm symptoms warranting EGD

## 2014-03-07 NOTE — Assessment & Plan Note (Addendum)
Assessment: Pt with history of disc degeneration and spondylosis of the cervical spine with b/l foraminal encroachment at C3-C7 due to spurring who presents with 1-year history of chronic neck pain with left arm radiation and paraesthesias without concern for cord compression most likely due to cervical radiculopathy.  Plan:  -Obtain MRI w/o cont cervical spine -Obtain CMP -Prescribe meloxicam 7.5 mg daily for 1 month trial  -Prescribe flexiriil 5 mg PRN bedtime for 1 month trial (pt instructed to not operate machinery while taking this medication) -Continue gabapentin 400 mg TID for neuropathy  -Refer to sports medicine and consider neurosurgery referral based on MRI results  -Consider imaging for lumbar spine due to chronic low back pain (no prior imaging on file)

## 2014-03-08 ENCOUNTER — Telehealth: Payer: Self-pay | Admitting: *Deleted

## 2014-03-08 ENCOUNTER — Other Ambulatory Visit: Payer: Self-pay | Admitting: Internal Medicine

## 2014-03-08 ENCOUNTER — Encounter: Payer: Self-pay | Admitting: *Deleted

## 2014-03-08 ENCOUNTER — Encounter (HOSPITAL_BASED_OUTPATIENT_CLINIC_OR_DEPARTMENT_OTHER): Payer: Self-pay | Admitting: *Deleted

## 2014-03-08 DIAGNOSIS — E875 Hyperkalemia: Secondary | ICD-10-CM

## 2014-03-08 NOTE — Progress Notes (Signed)
Pt had labs with [facility] out pt clinic 03/07/14-k elevated-reck 6/19-

## 2014-03-08 NOTE — Telephone Encounter (Signed)
Call made to pt- her potassium is elevated and needs to return next week for a repeat bmet.  Pt states she's scheduled for surgery on Monday June 22nd and would be out of town next week, but couldl come in on Fri June 19th for lab draw.  Discussed with DrRabbani, pt will return in 2 days for repeat bmet.Regenia Skeeter, Darlene Cassady6/16/201511:06 AM  Labs ordered stat, but pt does not have to stay for results.[place] Skeeter, Darlene Cassady6/16/201511:08 AM

## 2014-03-08 NOTE — Telephone Encounter (Signed)
Office will be closed on June 19th. Per Dr Naaman Plummer, pt can return on Thurs June 18th for lab only.Despina Hidden Cassady6/16/20151:38 PM

## 2014-03-09 NOTE — Progress Notes (Signed)
I saw patient and discussed her care with Dr. Naaman Plummer at the time of the visit.  We reviewed the resident's history and exam and pertinent patient test results.  I agree with the assessment, diagnosis, and plan of care documented in the resident's note.

## 2014-03-10 ENCOUNTER — Other Ambulatory Visit (INDEPENDENT_AMBULATORY_CARE_PROVIDER_SITE_OTHER): Payer: Medicare Other

## 2014-03-10 ENCOUNTER — Other Ambulatory Visit: Payer: Self-pay | Admitting: *Deleted

## 2014-03-10 DIAGNOSIS — E875 Hyperkalemia: Secondary | ICD-10-CM

## 2014-03-10 LAB — BASIC METABOLIC PANEL WITH GFR
BUN: 14 mg/dL (ref 6–23)
CHLORIDE: 102 meq/L (ref 96–112)
CO2: 25 mEq/L (ref 19–32)
Calcium: 9.4 mg/dL (ref 8.4–10.5)
Creat: 0.96 mg/dL (ref 0.50–1.10)
GFR, Est African American: 76 mL/min
GFR, Est Non African American: 66 mL/min
GLUCOSE: 85 mg/dL (ref 70–99)
POTASSIUM: 4.5 meq/L (ref 3.5–5.3)
Sodium: 140 mEq/L (ref 135–145)

## 2014-03-14 ENCOUNTER — Ambulatory Visit (HOSPITAL_BASED_OUTPATIENT_CLINIC_OR_DEPARTMENT_OTHER)
Admission: RE | Admit: 2014-03-14 | Discharge: 2014-03-14 | Disposition: A | Discharge status: Home / Self Care | Payer: Medicare Other | Source: Ambulatory Visit | Attending: Specialist | Admitting: Specialist

## 2014-03-14 ENCOUNTER — Encounter (HOSPITAL_BASED_OUTPATIENT_CLINIC_OR_DEPARTMENT_OTHER): Payer: Self-pay | Admitting: *Deleted

## 2014-03-14 ENCOUNTER — Encounter (HOSPITAL_BASED_OUTPATIENT_CLINIC_OR_DEPARTMENT_OTHER)
Admission: RE | Disposition: A | Discharge status: Home / Self Care | Payer: Self-pay | Source: Ambulatory Visit | Attending: Specialist

## 2014-03-14 ENCOUNTER — Ambulatory Visit (HOSPITAL_BASED_OUTPATIENT_CLINIC_OR_DEPARTMENT_OTHER): Payer: Medicare Other | Admitting: Anesthesiology

## 2014-03-14 ENCOUNTER — Encounter (HOSPITAL_BASED_OUTPATIENT_CLINIC_OR_DEPARTMENT_OTHER): Payer: Medicare Other | Admitting: Anesthesiology

## 2014-03-14 DIAGNOSIS — Z87891 Personal history of nicotine dependence: Secondary | ICD-10-CM | POA: Diagnosis not present

## 2014-03-14 DIAGNOSIS — Z9221 Personal history of antineoplastic chemotherapy: Secondary | ICD-10-CM | POA: Insufficient documentation

## 2014-03-14 DIAGNOSIS — Z421 Encounter for breast reconstruction following mastectomy: Secondary | ICD-10-CM | POA: Insufficient documentation

## 2014-03-14 DIAGNOSIS — Z853 Personal history of malignant neoplasm of breast: Secondary | ICD-10-CM | POA: Insufficient documentation

## 2014-03-14 DIAGNOSIS — M5030 Other cervical disc degeneration, unspecified cervical region: Secondary | ICD-10-CM | POA: Insufficient documentation

## 2014-03-14 DIAGNOSIS — F430 Acute stress reaction: Secondary | ICD-10-CM | POA: Diagnosis not present

## 2014-03-14 DIAGNOSIS — C50919 Malignant neoplasm of unspecified site of unspecified female breast: Secondary | ICD-10-CM | POA: Diagnosis not present

## 2014-03-14 DIAGNOSIS — Z9010 Acquired absence of unspecified breast and nipple: Secondary | ICD-10-CM | POA: Diagnosis not present

## 2014-03-14 DIAGNOSIS — J438 Other emphysema: Secondary | ICD-10-CM | POA: Insufficient documentation

## 2014-03-14 DIAGNOSIS — K219 Gastro-esophageal reflux disease without esophagitis: Secondary | ICD-10-CM | POA: Insufficient documentation

## 2014-03-14 DIAGNOSIS — G4700 Insomnia, unspecified: Secondary | ICD-10-CM | POA: Diagnosis not present

## 2014-03-14 HISTORY — PX: TISSUE EXPANDER PLACEMENT: SHX2530

## 2014-03-14 HISTORY — PX: BREAST RECONSTRUCTION: SHX9

## 2014-03-14 SURGERY — RECONSTRUCTION, NIPPLE
Anesthesia: General | Site: Breast | Laterality: Right

## 2014-03-14 MED ORDER — FENTANYL CITRATE 0.05 MG/ML IJ SOLN: 50.0000 ug | INTRAMUSCULAR | Status: DC | PRN

## 2014-03-14 MED ORDER — MIDAZOLAM HCL 2 MG/2ML IJ SOLN
INTRAMUSCULAR | Status: AC
  Filled 2014-03-14: qty 2

## 2014-03-14 MED ORDER — HYDROMORPHONE HCL PF 1 MG/ML IJ SOLN
0.2500 mg | INTRAMUSCULAR | Status: DC | PRN
  Administered 2014-03-14: 0.5 mg via INTRAVENOUS

## 2014-03-14 MED ORDER — LIDOCAINE HCL (CARDIAC) 20 MG/ML IV SOLN
INTRAVENOUS | Status: DC | PRN
  Administered 2014-03-14: 50 mg via INTRAVENOUS

## 2014-03-14 MED ORDER — MIDAZOLAM HCL 2 MG/2ML IJ SOLN: 1.0000 mg | INTRAMUSCULAR | Status: DC | PRN

## 2014-03-14 MED ORDER — LIDOCAINE-EPINEPHRINE 0.5 %-1:200000 IJ SOLN
INTRAMUSCULAR | Status: AC
  Filled 2014-03-14: qty 2

## 2014-03-14 MED ORDER — CEFAZOLIN SODIUM-DEXTROSE 2-3 GM-% IV SOLR
INTRAVENOUS | Status: AC
  Filled 2014-03-14: qty 50

## 2014-03-14 MED ORDER — ONDANSETRON HCL 4 MG/2ML IJ SOLN: 4.0000 mg | Freq: Once | INTRAMUSCULAR | Status: DC | PRN

## 2014-03-14 MED ORDER — LACTATED RINGERS IV SOLN
INTRAVENOUS | Status: DC
  Administered 2014-03-14 (×2): via INTRAVENOUS

## 2014-03-14 MED ORDER — OXYCODONE HCL 5 MG PO TABS: 5.0000 mg | ORAL_TABLET | Freq: Once | ORAL | Status: DC | PRN

## 2014-03-14 MED ORDER — HYDROMORPHONE HCL PF 1 MG/ML IJ SOLN
INTRAMUSCULAR | Status: AC
  Filled 2014-03-14: qty 1

## 2014-03-14 MED ORDER — FENTANYL CITRATE 0.05 MG/ML IJ SOLN
INTRAMUSCULAR | Status: DC | PRN
  Administered 2014-03-14: 100 ug via INTRAVENOUS

## 2014-03-14 MED ORDER — MINERAL OIL LIGHT 100 % EX OIL
TOPICAL_OIL | CUTANEOUS | Status: AC
  Filled 2014-03-14: qty 25

## 2014-03-14 MED ORDER — LIDOCAINE-EPINEPHRINE 0.5 %-1:200000 IJ SOLN
INTRAMUSCULAR | Status: DC | PRN
  Administered 2014-03-14: 35 mL

## 2014-03-14 MED ORDER — ONDANSETRON HCL 4 MG/2ML IJ SOLN
INTRAMUSCULAR | Status: DC | PRN
  Administered 2014-03-14: 4 mg via INTRAVENOUS

## 2014-03-14 MED ORDER — FENTANYL CITRATE 0.05 MG/ML IJ SOLN
INTRAMUSCULAR | Status: AC
  Filled 2014-03-14: qty 4

## 2014-03-14 MED ORDER — CEFAZOLIN SODIUM-DEXTROSE 2-3 GM-% IV SOLR
2.0000 g | INTRAVENOUS | Status: AC
  Administered 2014-03-14: 2 g via INTRAVENOUS

## 2014-03-14 MED ORDER — PHENYLEPHRINE HCL 10 MG/ML IJ SOLN
INTRAMUSCULAR | Status: DC | PRN
  Administered 2014-03-14 (×2): 80 ug via INTRAVENOUS
  Administered 2014-03-14 (×2): 40 ug via INTRAVENOUS

## 2014-03-14 MED ORDER — MEPERIDINE HCL 25 MG/ML IJ SOLN: 6.2500 mg | INTRAMUSCULAR | Status: DC | PRN

## 2014-03-14 MED ORDER — MIDAZOLAM HCL 5 MG/5ML IJ SOLN
INTRAMUSCULAR | Status: DC | PRN
  Administered 2014-03-14: 2 mg via INTRAVENOUS

## 2014-03-14 MED ORDER — DEXAMETHASONE SODIUM PHOSPHATE 4 MG/ML IJ SOLN
INTRAMUSCULAR | Status: DC | PRN
  Administered 2014-03-14: 10 mg via INTRAVENOUS

## 2014-03-14 MED ORDER — PROPOFOL 10 MG/ML IV BOLUS
INTRAVENOUS | Status: DC | PRN
  Administered 2014-03-14: 150 mg via INTRAVENOUS

## 2014-03-14 MED ORDER — OXYCODONE HCL 5 MG/5ML PO SOLN: 5.0000 mg | Freq: Once | ORAL | Status: DC | PRN

## 2014-03-14 SURGICAL SUPPLY — 70 items
APL SKNCLS STERI-STRIP NONHPOA (GAUZE/BANDAGES/DRESSINGS) ×1
BAG DECANTER FOR FLEXI CONT (MISCELLANEOUS) ×1 IMPLANT
BALL CTTN LRG ABS STRL LF (GAUZE/BANDAGES/DRESSINGS)
BENZOIN TINCTURE PRP APPL 2/3 (GAUZE/BANDAGES/DRESSINGS) ×4 IMPLANT
BLADE HEX COATED 2.75 (ELECTRODE) ×1 IMPLANT
BLADE KNIFE PERSONA 10 (BLADE) ×3 IMPLANT
BLADE KNIFE PERSONA 15 (BLADE) ×2 IMPLANT
CANISTER SUCT 1200ML W/VALVE (MISCELLANEOUS) ×3 IMPLANT
COTTONBALL LRG STERILE PKG (GAUZE/BANDAGES/DRESSINGS) ×2 IMPLANT
COVER MAYO STAND STRL (DRAPES) ×3 IMPLANT
COVER TABLE BACK 60X90 (DRAPES) ×3 IMPLANT
DECANTER SPIKE VIAL GLASS SM (MISCELLANEOUS) ×1 IMPLANT
DRAIN CHANNEL 10M FLAT 3/4 FLT (DRAIN) IMPLANT
DRAIN CHANNEL 7F 3/4 FLAT (WOUND CARE) IMPLANT
DRAPE LAPAROSCOPIC ABDOMINAL (DRAPES) ×3 IMPLANT
DRSG PAD ABDOMINAL 8X10 ST (GAUZE/BANDAGES/DRESSINGS) ×12 IMPLANT
ELECT BLADE 6.5 .24CM SHAFT (ELECTRODE) ×1 IMPLANT
ELECT REM PT RETURN 9FT ADLT (ELECTROSURGICAL) ×3
ELECTRODE REM PT RTRN 9FT ADLT (ELECTROSURGICAL) ×1 IMPLANT
EVACUATOR SILICONE 100CC (DRAIN) ×1 IMPLANT
FILTER 7/8 IN (FILTER) ×1 IMPLANT
GAUZE SPONGE 4X4 12PLY STRL (GAUZE/BANDAGES/DRESSINGS) ×3 IMPLANT
GAUZE XEROFORM 1X8 LF (GAUZE/BANDAGES/DRESSINGS) ×4 IMPLANT
GAUZE XEROFORM 5X9 LF (GAUZE/BANDAGES/DRESSINGS) ×1 IMPLANT
GLOVE BIO SURGEON STRL SZ 6.5 (GLOVE) ×2 IMPLANT
GLOVE BIO SURGEONS STRL SZ 6.5 (GLOVE) ×1
GLOVE BIOGEL M STRL SZ7.5 (GLOVE) ×3 IMPLANT
GLOVE BIOGEL PI IND STRL 8 (GLOVE) ×1 IMPLANT
GLOVE BIOGEL PI INDICATOR 8 (GLOVE) ×2
GLOVE ECLIPSE 7.0 STRL STRAW (GLOVE) ×3 IMPLANT
GOWN STRL REUS W/ TWL LRG LVL3 (GOWN DISPOSABLE) IMPLANT
GOWN STRL REUS W/ TWL XL LVL3 (GOWN DISPOSABLE) ×1 IMPLANT
GOWN STRL REUS W/TWL LRG LVL3 (GOWN DISPOSABLE)
GOWN STRL REUS W/TWL XL LVL3 (GOWN DISPOSABLE) ×6
IV NS 500ML (IV SOLUTION)
IV NS 500ML BAXH (IV SOLUTION) ×4 IMPLANT
KIT FILL SYSTEM UNIVERSAL (SET/KITS/TRAYS/PACK) ×1 IMPLANT
NDL SAFETY ECLIPSE 18X1.5 (NEEDLE) IMPLANT
NDL SPNL 18GX3.5 QUINCKE PK (NEEDLE) IMPLANT
NEEDLE HYPO 18GX1.5 SHARP (NEEDLE) ×3
NEEDLE HYPO 25X1 1.5 SAFETY (NEEDLE) ×3 IMPLANT
NEEDLE SPNL 18GX3.5 QUINCKE PK (NEEDLE) ×3 IMPLANT
NS IRRIG 1000ML POUR BTL (IV SOLUTION) ×3 IMPLANT
PACK BASIN DAY SURGERY FS (CUSTOM PROCEDURE TRAY) ×3 IMPLANT
PEN SKIN MARKING BROAD TIP (MISCELLANEOUS) ×3 IMPLANT
PIN SAFETY STERILE (MISCELLANEOUS) ×1 IMPLANT
SPECIMEN JAR MEDIUM (MISCELLANEOUS) ×2 IMPLANT
SPONGE GAUZE 4X4 12PLY STER LF (GAUZE/BANDAGES/DRESSINGS) IMPLANT
SPONGE LAP 18X18 X RAY DECT (DISPOSABLE) ×6 IMPLANT
STRIP SUTURE WOUND CLOSURE 1/2 (SUTURE) ×6 IMPLANT
SUT ETHILON 3 0 PS 1 (SUTURE) IMPLANT
SUT ETHILON 5 0 PS 2 18 (SUTURE) ×4 IMPLANT
SUT MNCRL AB 3-0 PS2 18 (SUTURE) ×7 IMPLANT
SUT MON AB 2-0 CT1 36 (SUTURE) ×3 IMPLANT
SUT PROLENE 3 0 PS 1 (SUTURE) ×1 IMPLANT
SUT PROLENE 4 0 PS 2 18 (SUTURE) ×6 IMPLANT
SYR BULB IRRIGATION 50ML (SYRINGE) IMPLANT
SYR CONTROL 10ML LL (SYRINGE) ×3 IMPLANT
SYRINGE 20CC LL (MISCELLANEOUS) ×1 IMPLANT
SYRINGE 60CC LL (MISCELLANEOUS) ×4 IMPLANT
TAPE HYPAFIX 6 X30' (GAUZE/BANDAGES/DRESSINGS) ×1
TAPE HYPAFIX 6X30 (GAUZE/BANDAGES/DRESSINGS) ×2 IMPLANT
TAPE MEASURE 72IN RETRACT (INSTRUMENTS)
TAPE MEASURE LINEN 72IN RETRCT (INSTRUMENTS) IMPLANT
TOWEL OR 17X24 6PK STRL BLUE (TOWEL DISPOSABLE) ×12 IMPLANT
TUBE CONNECTING 20'X1/4 (TUBING) ×1
TUBE CONNECTING 20X1/4 (TUBING) ×2 IMPLANT
UNDERPAD 30X30 INCONTINENT (UNDERPADS AND DIAPERS) ×6 IMPLANT
VAC PENCILS W/TUBING CLEAR (MISCELLANEOUS) ×3 IMPLANT
YANKAUER SUCT BULB TIP NO VENT (SUCTIONS) ×3 IMPLANT

## 2014-03-14 NOTE — Brief Op Note (Signed)
03/14/2014  11:25 AM  PATIENT:  Mary Jenkins  58 y.o. female  PRE-OPERATIVE DIAGNOSIS:  Malignant neoplasm of right breast   POST-OPERATIVE DIAGNOSIS:  Malignant neoplasm of right breast   PROCEDURE:  Procedure(s): RECONSTRUCTION NIPPLE RIGHT BREAST (Right) SALINE REMOVAL RIGHT TISSUE EXPANDER (Right)  SURGEON:  Surgeon(s) and Role:    * Cristine Polio, MD - Primary  PHYSICIAN ASSISTANT:   ASSISTANTS: none   ANESTHESIA:   general  EBL:  Total I/O In: 500 [I.V.:500] Out: -   BLOOD ADMINISTERED:none  DRAINS: none   LOCAL MEDICATIONS USED:  LIDOCAINE   SPECIMEN:  Excision  DISPOSITION OF SPECIMEN:  PATHOLOGY  COUNTS:  YES  TOURNIQUET:  * No tourniquets in log *  DICTATION: .Other Dictation: Dictation Number [id number]  PLAN OF CARE: Discharge to home after PACU  PATIENT DISPOSITION:  PACU - hemodynamically stable.   Delay start of Pharmacological VTE agent (>24hrs) due to surgical blood loss or risk of bleeding: not applicable

## 2014-03-14 NOTE — Discharge Instructions (Signed)
Activity As tolerated: NO showers NO driving No heavy activities  Diet:regular No restrictions:  Wound Care: Keep dressing clean & dry  Do not change dressings For Abdominoplasties wear abdominal binder Special Instructions: Do not raise arms up Continue to empty, recharge, & record drainage from J-P drains &/or  Call Doctor if any unusual problems occur such as pain, excessive Bleeding, unrelieved Nausea/vomiting, Fever &/or chills When lying down, keep head elevated on 2-3 pillows or back-rest  Follow-up appointment: Please call the office. Post Anesthesia Home Care Instructions  Activity: Get plenty of rest for the remainder of the day. A responsible adult should stay with you for 24 hours following the procedure.  For the next 24 hours, DO NOT: -Drive a car -Paediatric nurse -Drink alcoholic beverages -Take any medication unless instructed by your physician -Make any legal decisions or sign important papers.  Meals: Start with liquid foods such as gelatin or soup. Progress to regular foods as tolerated. Avoid greasy, spicy, heavy foods. If nausea and/or vomiting occur, drink only clear liquids until the nausea and/or vomiting subsides. Call your physician if vomiting continues.  Special Instructions/Symptoms: Your throat may feel dry or sore from the anesthesia or the breathing tube placed in your throat during surgery. If this causes discomfort, gargle with warm salt water. The discomfort should disappear within 24 hours.

## 2014-03-14 NOTE — Anesthesia Procedure Notes (Signed)
Procedure Name: LMA Insertion Date/Time: 03/14/2014 10:42 AM Performed by: Lieutenant Diego Pre-anesthesia Checklist: Patient identified, Emergency Drugs available, Suction available and Patient being monitored Patient Re-evaluated:Patient Re-evaluated prior to inductionOxygen Delivery Method: Circle System Utilized Preoxygenation: Pre-oxygenation with 100% oxygen Intubation Type: IV induction Ventilation: Mask ventilation without difficulty LMA: LMA inserted LMA Size: 4.0 Number of attempts: 1 Airway Equipment and Method: bite block Placement Confirmation: positive ETCO2 and breath sounds checked- equal and bilateral Tube secured with: Tape Dental Injury: Teeth and Oropharynx as per pre-operative assessment

## 2014-03-14 NOTE — H&P (Signed)
Mary Jenkins is an 58 y.o. female.   Chief Complaint: Hx of right breast cancer with initial reconstruction by Dr Harlow Mares  Has had corrective reconstruction and is now ready for nipple areolar reconstruction HPI: See above  Past Medical History  Diagnosis Date  . Personal history of chemotherapy 1997  . Insomnia 06/24/2011  . GERD (gastroesophageal reflux disease) 02/03/2012  . 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  Separate hypoattenuating lesions within the liver, favored to represent small cysts or complex cyst. Too small to entirely characterize.   CT abdomen/pelvis 11/2011. 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 2. Also unchanged is a 7 mm low attenuation lesion in segment 8 of  the liver, which is too small to characterize, but is favored to represent a small cyst or biliary hamartoma. 3. Small amount of pneumobilia in the left lobe of the liver, as above, new compared to the prior examination 06/14/2011. This is of uncertain etiology and significance, but is a common finding in patients who have had prior sphincterotomy (clinical correlation is recommended).     . COPD (chronic obstructive pulmonary disease) 06/24/2011  . Emphysema   . Exertional shortness of breath   . DDD (degenerative disc disease), cervical   . Wears glasses   . Full dentures   . Breast cancer 1997    chemo-no PAC  . Anemia     Past Surgical History  Procedure Laterality Date  . Tubal ligation  1980's  . Orif wrist fracture      lt-age 49  . Colonoscopy    . Breast biopsy Left 1997  . Breast surgery  97    implant  . Mastectomy, partial Right 1997    -node dissection  . Breast capsulotomy with implant exchange Right 06/21/2013   Procedure: REVISION RIGHT BREAST RECONSTRUCTION/REMOVAL OF RIGHT IMPLANT/RIGHT BREAST CAPSULOTOMY WITH INSERT TISSUE EXPLANDER RIGHT BREAST/POSSIBLE LEFT BREAST MASTOPEXY;  Surgeon: Cristine Polio, MD;  Location: [place];  Service: Plastics;  Laterality: Right;  . Breast reconstruction Right 06/21/2013    Procedure: BREAST RECONSTRUCTION;  Surgeon: Cristine Polio, MD;  Location: [place];  Service: Plastics;  Laterality: Right;  . Breast reconstruction Left 11/29/2013    Procedure: LEFT MASTOPEXY FOR RECONSTRUCTION;  Surgeon: Cristine Polio, MD;  Location: [place];  Service: Plastics;  Laterality: Left;    Family History  Problem Relation Age of Onset  . Hypertension Sister   . Colon cancer Neg Hx   . Esophageal cancer Neg Hx   . Stomach cancer Neg Hx   . Heart disease Brother   . Heart disease Brother   . Heart disease Brother   . Hypertension Mother    Social History:  reports that she quit smoking about 19 months ago. Her smoking use included Cigarettes. She has a 205 pack-year smoking history. She has never used smokeless tobacco. She reports that she does not drink alcohol or use illicit drugs.  Allergies:  Allergies  Allergen Reactions  . Aspirin Other (See Comments)    Pt does not remember reaction    No prescriptions prior to admission    No results found for this or any previous visit (from the  past 48 hour(s)). No results found.  Review of Systems  Constitutional: Negative.   HENT: Negative.   Eyes: Negative.   Respiratory: Negative.   Cardiovascular: Negative.   Gastrointestinal: Negative.   Genitourinary: Negative.   Musculoskeletal: Negative.   Skin: Negative.   Neurological: Negative.   Endo/Heme/Allergies: Negative.   Psychiatric/Behavioral: Negative.     Height _0  (1.676 m), weight 57.607 kg (127 lb). Physical Exam   Assessment/Plan Right breast cancer SP reconstruction  And for nipple  areolar reconstruction  TRUESDALE,GERALD L 03/14/2014, 7:27 AM

## 2014-03-14 NOTE — Anesthesia Preprocedure Evaluation (Signed)
Anesthesia Evaluation  Patient identified by MRN, date of birth, ID band Patient awake    Reviewed: Allergy & Precautions, H&P , NPO status , Patient's Chart, lab work & pertinent test results  Airway Mallampati: I TM Distance: >3 FB Neck ROM: Full    Dental   Pulmonary COPDformer smoker,          Cardiovascular     Neuro/Psych    GI/Hepatic GERD-  Medicated and Controlled,  Endo/Other    Renal/GU      Musculoskeletal   Abdominal   Peds  Hematology   Anesthesia Other Findings   Reproductive/Obstetrics                           Anesthesia Physical Anesthesia Plan  ASA: III  Anesthesia Plan: General   Post-op Pain Management:    Induction: Intravenous  Airway Management Planned: LMA  Additional Equipment:   Intra-op Plan:   Post-operative Plan: Extubation in OR  Informed Consent: I have reviewed the patients History and Physical, chart, labs and discussed the procedure including the risks, benefits and alternatives for the proposed anesthesia with the patient or authorized representative who has indicated his/her understanding and acceptance.     Plan Discussed with: CRNA and Surgeon  Anesthesia Plan Comments:         Anesthesia Quick Evaluation

## 2014-03-14 NOTE — Anesthesia Postprocedure Evaluation (Signed)
Anesthesia Post Note  Patient: Mary Jenkins  Procedure(s) Performed: Procedure(s) (LRB): RECONSTRUCTION NIPPLE RIGHT BREAST (Right) SALINE REMOVAL RIGHT TISSUE EXPANDER (Right)  Anesthesia type: general  Patient location: PACU  Post pain: Pain level controlled  Post assessment: Patient's Cardiovascular Status Stable  Last Vitals:  Filed Vitals:   03/14/14 1306  BP: 105/65  Pulse: 60  Temp: 36.4 C  Resp: 18    Post vital signs: Reviewed and stable  Level of consciousness: sedated  Complications: No apparent anesthesia complications

## 2014-03-14 NOTE — Transfer of Care (Signed)
Immediate Anesthesia Transfer of Care Note  Patient: Mary Jenkins  Procedure(s) Performed: Procedure(s): RECONSTRUCTION NIPPLE RIGHT BREAST (Right) SALINE REMOVAL RIGHT TISSUE EXPANDER (Right)  Patient Location: PACU  Anesthesia Type:General  Level of Consciousness: awake and patient cooperative  Airway & Oxygen Therapy: Patient Spontanous Breathing and Patient connected to face mask oxygen  Post-op Assessment: Report given to PACU RN and Post -op Vital signs reviewed and stable  Post vital signs: Reviewed and stable  Complications: No apparent anesthesia complications

## 2014-03-15 ENCOUNTER — Encounter (HOSPITAL_BASED_OUTPATIENT_CLINIC_OR_DEPARTMENT_OTHER): Payer: Self-pay | Admitting: Specialist

## 2014-03-15 NOTE — Op Note (Signed)
NAMEJERITA, WIMBUSH                ACCOUNT NO.:  [id number]  MEDICAL RECORD NO.:  [id number]  LOCATION:                                 FACILITY:  PHYSICIAN:  Berneta Sages L. Jadier Rockers, M.D.DATE OF BIRTH:  06/04/1956  DATE OF PROCEDURE:  03/14/2014 DATE OF DISCHARGE:  03/14/2014                              OPERATIVE REPORT   A 58 year old lady who has a history of right breast cancer, had a previous reconstruction done by Dr. Harlow Mares in the past.  I re-did it, and now she is ready for the nipple-areolar complex reconstruction.  I have used a tissue expander, which I have over-expanded somewhat, just to give [place] some ptosis while there is some of that today so we will plan on removing some of the fluid from the right tissue expander, approximately 60 to 80 mL.  Preoperatively, the patient was sat up and drawn for the midline of the chest as well as the measuring from the nipple-areolar complex to the nipple area on the left side, that is 22 cm.  We did the same thing on the right side to make it the same.  We then took her to the operating room, placed on operating room table in a supine position, was given adequate general anesthesia and intubated orally.  Prep was done of chest, breast areas, and the right groin areas with Hibiclens soap and solution, walled off with sterile towels and drapes so as to make a sterile field.  We did drawings of the nipple- areolar complex on the left, and superimposed it on the right groin area as well as a right breast reconstruction area.  We did a measurement and draw lines for skate flaps on that area and the skate flap was elevated with #15 blade being careful not to go too deep because of the tissue expander.  I have removed 60 mL of fluid from the tissue expander to make it look more like the left side.  After the skate flap have then elevated, we secured it basally with 6-0 Prolene suture.  Next, the skin was de-epithelialized and a full-thickness  skin graft was taken from the right groin area, the defect then re-closed back with 2-0 Monocryl and 3- 0 Monocryl subcutaneously and then a running subcuticular stitch of 3-0 Monocryl.  The graft was defatted and then placed over the defect and secured with 4-0 Prolene suture __________ dressing using Xeroform, cotton 4x4s, ABD pads.  The groin site was covered with half-inch Steri- Strips, Xeroform, 4x4s, and ABDs.  __________ was applied.  She withstood the procedures very well and was taken to recovery in excellent condition.  Estimated blood loss less than 100 mL. Complications none.     Odella Aquas. Towanda Malkin, M.D.     Elie Confer  D:  03/14/2014  T:  03/15/2014  Job:  [id number]

## 2014-03-19 DIAGNOSIS — E875 Hyperkalemia: Secondary | ICD-10-CM | POA: Insufficient documentation

## 2014-03-19 NOTE — Assessment & Plan Note (Addendum)
Assessment: Pt with no prior history of hyperkalemia who presents with asymptomatic potassium level of 5.5.   Plan:  -Repeat BMP on 6/18---> K normalized to 4.5

## 2014-03-29 DIAGNOSIS — H2510 Age-related nuclear cataract, unspecified eye: Secondary | ICD-10-CM | POA: Diagnosis not present

## 2014-03-29 DIAGNOSIS — H25049 Posterior subcapsular polar age-related cataract, unspecified eye: Secondary | ICD-10-CM | POA: Diagnosis not present

## 2014-03-29 DIAGNOSIS — H04129 Dry eye syndrome of unspecified lacrimal gland: Secondary | ICD-10-CM | POA: Diagnosis not present

## 2014-03-29 DIAGNOSIS — H25019 Cortical age-related cataract, unspecified eye: Secondary | ICD-10-CM | POA: Diagnosis not present

## 2014-04-22 ENCOUNTER — Ambulatory Visit (INDEPENDENT_AMBULATORY_CARE_PROVIDER_SITE_OTHER): Payer: Medicare Other | Admitting: Internal Medicine

## 2014-04-22 ENCOUNTER — Encounter: Payer: Self-pay | Admitting: Internal Medicine

## 2014-04-22 VITALS — BP 114/73 | HR 81 | Temp 97.5°F | Ht 65.5 in | Wt 127.7 lb

## 2014-04-22 DIAGNOSIS — M5412 Radiculopathy, cervical region: Secondary | ICD-10-CM

## 2014-04-22 DIAGNOSIS — Z20828 Contact with and (suspected) exposure to other viral communicable diseases: Secondary | ICD-10-CM | POA: Diagnosis not present

## 2014-04-22 DIAGNOSIS — E785 Hyperlipidemia, unspecified: Secondary | ICD-10-CM | POA: Insufficient documentation

## 2014-04-22 DIAGNOSIS — D179 Benign lipomatous neoplasm, unspecified: Secondary | ICD-10-CM | POA: Diagnosis not present

## 2014-04-22 MED ORDER — GABAPENTIN 400 MG PO CAPS: 400.0000 mg | ORAL_CAPSULE | Freq: Three times a day (TID) | ORAL | Status: DC

## 2014-04-22 NOTE — Telephone Encounter (Signed)
A user error has taken place: encounter opened in error, closed for administrative reasons.Regenia Skeeter, Mattilyn Crites Cassady7/31/201512:01 PM  .

## 2014-04-22 NOTE — Patient Instructions (Signed)
Please pick up your gabapentin at the pharmacy and return in 2-3 weeks to repeat the test.   Thank you!

## 2014-04-22 NOTE — Progress Notes (Addendum)
   Subjective:    Patient ID: Mary Jenkins, female    DOB: Oct 08, 1955, 58 y.o.   MRN: [id number]  HPI [name] is a 58 year old woman with COPD GOLD Stage IV, cervical radiculopathy, GERD, R breast cancer s/p mastectomy (1997), chemo, and corrective reconstruction who presents today for follow-up visit.  On 6/22, she underwent nipple areolar reconstruction. She does not have any complications related to it and is doing well. She needs a refill of her gapapentin today.  HIV: She is concerned her female partner might have been infected as she suspects he might have had female partners. This is first time she has not been single in the last 6 years but is no longer sexually active; last unprotected sex was 8 days ago (7/23). She denies any new vaginal discharge, rash, bumps, dysuria. Her last test in 2012 was negative.   Right arm swelling: She complains of a bump on her right arm and thinks it might be related to some swelling in her right arm. When she twists her arm while combing her hair or opening a bottle, it causes that area to swell up and induce pain. She has had this for some time since her cancer.   Review of Systems  Constitutional: Negative for fever, activity change, appetite change, fatigue and unexpected weight change.  Respiratory: Negative for shortness of breath.   Cardiovascular: Negative for chest pain.  Genitourinary: Negative for dysuria, vaginal discharge, genital sores and pelvic pain.  Musculoskeletal: Positive for myalgias.  All other systems reviewed and are negative.      Objective:   Physical Exam  Constitutional: She is oriented to person, place, and time. She appears well-developed and well-nourished. No distress.  HENT:  Head: Normocephalic and atraumatic.  Eyes: Conjunctivae are normal. Pupils are equal, round, and reactive to light.  Cardiovascular: Normal rate, regular rhythm and normal heart sounds.  Exam reveals no gallop and no friction rub.   No  murmur heard. Pulmonary/Chest: Effort normal. No respiratory distress. She has no wheezes. She has no rales.  Abdominal: Soft. Bowel sounds are normal. She exhibits no distension. There is no tenderness.  Neurological: She is alert and oriented to person, place, and time. No cranial nerve deficit. Coordination normal.  Skin: Palpable, rubbery 1-2 cm nodule at right forearm without tenderness, erythema, or swelling.  Psychiatric: Her behavior is normal.        Assessment & Plan:

## 2014-04-23 ENCOUNTER — Telehealth: Payer: Self-pay | Admitting: Internal Medicine

## 2014-04-23 LAB — HIV ANTIBODY (ROUTINE TESTING W REFLEX): HIV 1&2 Ab, 4th Generation: NONREACTIVE

## 2014-04-23 LAB — LIPID PANEL
CHOL/HDL RATIO: 2.6 ratio
CHOLESTEROL: 233 mg/dL — AB (ref 0–200)
HDL: 88 mg/dL (ref >39)
LDL Cholesterol: 132 mg/dL — ABNORMAL HIGH (ref 0–99)
TRIGLYCERIDES: 67 mg/dL (ref <150)
VLDL: 13 mg/dL (ref 0–40)

## 2014-04-23 NOTE — Telephone Encounter (Signed)
I called her to let her know that her test was negative but that it could be too early in the infection for it to turn positive. We will repeat the test at her next appointment, and she agreed. I also told her to call if she began experiencing any symptoms.

## 2014-04-24 NOTE — Assessment & Plan Note (Signed)
Assessment -I repeated lipid panel today as it had been some time since she had one done.  -Framingham score using last lipid data is 5.8%. Plan -Repeat lipid panel  ADDENDUM 04/24/2014 1:56 PM:  -Framingham score using new data is 4.0%, down due to her quitting smoking. -Her cholesterol levels are worse than 2012. -Consider lifestyle modification instead of statin therapy.

## 2014-04-24 NOTE — Assessment & Plan Note (Addendum)
Assessment -She still reports reports that are causing her significant distress and thus warrants further workup.  Plan -Schedule appropriate imaging study after speaking with radiology  ADDENDUM 04/25/2014 11:25 AM:  -Radiology recommends MRI, so I will order it once accordingly.

## 2014-04-24 NOTE — Assessment & Plan Note (Signed)
Assessment -Her suspicion that her partner may have it puts her at a high pre-test probability though she is currently asymptomatic.  Plan -Repeat HIV Ag test today -Recheck in 3-6 weeks  ADDENDUM 04/24/2014 1:41 PM:  -I did not consider the possibility of other STDs given absence of related symptoms but should consider checking them at follow-up, namely Chlamydia.

## 2014-04-24 NOTE — Assessment & Plan Note (Signed)
Assessment -She does not complain of new pain today and is controlled on gabapentin.  Plan -Refilled gabapentin today

## 2014-04-25 NOTE — Progress Notes (Signed)
I saw and evaluated the patient.  I personally confirmed the key portions of the history and exam documented by Dr. Posey Pronto and I reviewed pertinent patient test results.  The assessment, diagnosis, and plan were formulated together and I agree with the documentation in the resident's note.

## 2014-04-27 ENCOUNTER — Ambulatory Visit (HOSPITAL_COMMUNITY): Payer: Medicare Other

## 2014-04-27 ENCOUNTER — Ambulatory Visit (HOSPITAL_COMMUNITY)
Admission: RE | Admit: 2014-04-27 | Discharge: 2014-04-27 | Disposition: A | Discharge status: Home / Self Care | Payer: Medicare Other | Source: Ambulatory Visit | Attending: Internal Medicine | Admitting: Internal Medicine

## 2014-04-27 DIAGNOSIS — M542 Cervicalgia: Secondary | ICD-10-CM

## 2014-04-27 DIAGNOSIS — M47812 Spondylosis without myelopathy or radiculopathy, cervical region: Secondary | ICD-10-CM | POA: Diagnosis not present

## 2014-04-27 NOTE — Addendum Note (Signed)
Addended byJuluis Mire on: 04/27/2014 11:49 AM   Modules accepted: Orders

## 2014-05-06 ENCOUNTER — Other Ambulatory Visit: Payer: Self-pay | Admitting: Internal Medicine

## 2014-05-06 ENCOUNTER — Other Ambulatory Visit: Payer: Medicare Other

## 2014-05-06 ENCOUNTER — Other Ambulatory Visit: Payer: Self-pay | Admitting: *Deleted

## 2014-05-06 DIAGNOSIS — Z20828 Contact with and (suspected) exposure to other viral communicable diseases: Secondary | ICD-10-CM

## 2014-05-07 LAB — HIV ANTIBODY (ROUTINE TESTING W REFLEX): HIV 1&2 Ab, 4th Generation: NONREACTIVE

## 2014-05-07 MED ORDER — TIOTROPIUM BROMIDE MONOHYDRATE 18 MCG IN CAPS: 18.0000 ug | ORAL_CAPSULE | Freq: Every day | RESPIRATORY_TRACT | Status: DC

## 2014-05-07 NOTE — Telephone Encounter (Signed)
As I saw this patient recently and feel it's necessary for the treatment of their disease, I will refill this prescription for six months.

## 2014-05-09 ENCOUNTER — Telehealth: Payer: Self-pay | Admitting: Internal Medicine

## 2014-05-09 NOTE — Telephone Encounter (Signed)
I spoke to the patient today about her MRI results and possible neural impingement of C4-7. She feels her symptoms have worsened following the study, and I told her that I will decide upon on whom to refer her.  I also informed her that her recent HIV results were reassuring and that her last lipid panel was elevated. She said she would schedule an appointment to see me back in October.

## 2014-05-10 ENCOUNTER — Other Ambulatory Visit: Payer: Self-pay | Admitting: Internal Medicine

## 2014-05-10 DIAGNOSIS — M5412 Radiculopathy, cervical region: Secondary | ICD-10-CM

## 2014-05-11 ENCOUNTER — Telehealth: Payer: Self-pay | Admitting: Internal Medicine

## 2014-05-11 NOTE — Telephone Encounter (Signed)
I spoke to her today to let her know that she will be referred to neurosurgery for further management of her cervical stenosis. She acknowledged my update and will await a call about her appointment.

## 2014-05-17 ENCOUNTER — Other Ambulatory Visit: Payer: Self-pay

## 2014-05-20 ENCOUNTER — Telehealth: Payer: Self-pay | Admitting: *Deleted

## 2014-05-20 ENCOUNTER — Ambulatory Visit (INDEPENDENT_AMBULATORY_CARE_PROVIDER_SITE_OTHER): Payer: Medicare Other | Admitting: Internal Medicine

## 2014-05-20 ENCOUNTER — Encounter: Payer: Self-pay | Admitting: Internal Medicine

## 2014-05-20 VITALS — BP 127/85 | HR 94 | Temp 98.2°F | Ht 65.5 in | Wt 126.0 lb

## 2014-05-20 DIAGNOSIS — M5412 Radiculopathy, cervical region: Secondary | ICD-10-CM | POA: Diagnosis not present

## 2014-05-20 DIAGNOSIS — Z20828 Contact with and (suspected) exposure to other viral communicable diseases: Secondary | ICD-10-CM | POA: Diagnosis not present

## 2014-05-20 DIAGNOSIS — E785 Hyperlipidemia, unspecified: Secondary | ICD-10-CM | POA: Diagnosis not present

## 2014-05-20 DIAGNOSIS — D179 Benign lipomatous neoplasm, unspecified: Secondary | ICD-10-CM | POA: Diagnosis not present

## 2014-05-20 MED ORDER — TRAMADOL HCL 50 MG PO TABS: 50.0000 mg | ORAL_TABLET | Freq: Two times a day (BID) | ORAL | Status: DC | PRN

## 2014-05-20 MED ORDER — IBUPROFEN 800 MG PO TABS: 800.0000 mg | ORAL_TABLET | Freq: Three times a day (TID) | ORAL | Status: DC | PRN

## 2014-05-20 NOTE — Progress Notes (Signed)
Patient ID: Mary Jenkins, female   DOB: 1956-07-15, 58 y.o.   MRN: [id number]    Subjective:   Patient ID: Mary Jenkins female   DOB: 11-02-1955 58 y.o.   MRN: [id number]  HPI: Mary Jenkins is a 58 y.o.  very pleasant woman with past medical history of COPD Gold IV, breast cancer s/p mastectomy in 1997 with revision in 2014, DJD, and GERD who presents with chief complaint of neck pain with radiation to left arm.  She reports that since recently receiving an MRI of her neck on 04/29/14 she has had worsening neck pain and left upper arm pain with swelling across the back of her neck that is worse with movement. She denies paraesthesias or weakness of her left arm. She has been using mobic daily and flexiril without relief. She also takes gabapentin for neuropathy She was previously on extra strength ibuprofen which seemed to help. She denies recent trauma, injury, overuse, or fall. Her MRI revealed multilevel spondylosis and possible right sided symptomatic neural impingement however she denies right sided symptoms. She was referred to neurosurgery but has not been called yet with an appointment. She has occasional headaches and mild dizziness but denies bladder/bowel incontinence, LE weakness/paraestheaiss, or gait instability. She has chronic low back pain that is no worse than normal.    Past Medical History  Diagnosis Date  . Personal history of chemotherapy 1997  . Insomnia 06/24/2011  . GERD (gastroesophageal reflux disease) 02/03/2012  . 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  Separate hypoattenuating lesions within the liver, favored to represent small cysts or complex cyst. Too small to entirely characterize.   CT abdomen/pelvis 11/2011. No significant change in size and appearance of small 6 mm hypervascular lesion in segment 4A of  the liver, again favored to  represent a benign perfusion anomaly. 2. Also unchanged is a 7 mm low attenuation lesion in segment 8 of  the liver, which is too small to characterize, but is favored to represent a small cyst or biliary hamartoma. 3. Small amount of pneumobilia in the left lobe of the liver, as above, new compared to the prior examination 06/14/2011. This is of uncertain etiology and significance, but is a common finding in patients who have had prior sphincterotomy (clinical correlation is recommended).     . COPD (chronic obstructive pulmonary disease) 06/24/2011  . Emphysema   . Exertional shortness of breath   . DDD (degenerative disc disease), cervical   . Wears glasses   . Full dentures   . Breast cancer 1997    chemo-no PAC  . Anemia    Current Outpatient Prescriptions  Medication Sig Dispense Refill  . albuterol (PROVENTIL HFA;VENTOLIN HFA) 108 (90 BASE) MCG/ACT inhaler Inhale 2 puffs into the lungs every 6 (six) hours as needed for wheezing.  1 Inhaler  1  . albuterol (PROVENTIL) (2.5 MG/3ML) 0.083% nebulizer solution Take 2.5 mg by nebulization every 6 (six) hours as needed for wheezing. For wheezing      . budesonide-formoterol (SYMBICORT) 160-4.5 MCG/ACT inhaler Inhale 2 puffs into the lungs 2 (two) times daily.  3 Inhaler  1  . cyclobenzaprine (FLEXERIL) 5 MG tablet Take 1 tablet (5 mg total) by mouth at bedtime as needed for muscle spasms.  30 tablet  0  . famotidine (PEPCID) 10 MG  tablet Take 10 mg by mouth at bedtime.      . gabapentin (NEURONTIN) 400 MG capsule Take 1 capsule (400 mg total) by mouth 3 (three) times daily.  90 capsule  1  . meloxicam (MOBIC) 7.5 MG tablet Take 1 tablet (7.5 mg total) by mouth daily.  30 tablet  0  . omeprazole (PRILOSEC) 20 MG capsule Take 1 capsule (20 mg total) by mouth 2 (two) times daily.  60 capsule  6  . tiotropium (SPIRIVA) 18 MCG inhalation capsule Place 1 capsule (18 mcg total) into inhaler and inhale daily.  30 capsule  6     No current facility-administered medications for this visit.   Family History  Problem Relation Age of Onset  . Hypertension Sister   . Colon cancer Neg Hx   . Esophageal cancer Neg Hx   . Stomach cancer Neg Hx   . Heart disease Brother   . Heart disease Brother   . Heart disease Brother   . Hypertension Mother    History   Social History  . Marital Status: Single    Spouse Name: N/A    Number of Children: N/A  . Years of Education: N/A   Social History Main Topics  . Smoking status: Former Smoker -- 5.00 packs/day for 41 years    Types: Cigarettes    Quit date: 07/23/2012  . Smokeless tobacco: Never Used  . Alcohol Use: No  . Drug Use: No     Comment: I6/20/2014 "n the past cocaine abuse; quit before I quit smoking in 06/2012"  . Sexual Activity: None   Other Topics Concern  . None   Social History Narrative  . None   Review of Systems: Review of Systems  Constitutional: Negative for fever and chills.  Eyes: Negative for blurred vision.  Respiratory: Positive for cough (chronic dry). Negative for shortness of breath and wheezing.   Cardiovascular: Negative for chest pain and leg swelling.  Gastrointestinal: Negative for nausea, vomiting, abdominal pain, diarrhea and constipation.  Genitourinary: Negative for dysuria, urgency and frequency.  Musculoskeletal: Positive for back pain (chronic low back) and neck pain (worse after rececnt MRI). Negative for falls.  Neurological: Positive for dizziness (occasionally), sensory change (chronic in left fingertips controlled on gabapentin ) and headaches (chronic). Negative for focal weakness.    Objective:  Physical Exam: Filed Vitals:   05/20/14 1550  BP: 127/85  Pulse: 94  Temp: 98.2 F (36.8 C)  TempSrc: Oral  Height: 5' 5.5" (1.664 m)  Weight: 126 lb (57.153 kg)  SpO2: 94%    Physical Exam  Constitutional: She is oriented to person, place, and time. She appears well-developed and well-nourished. No  distress.  HENT:  Head: Normocephalic and atraumatic.  Eyes: EOM are normal.  Neck: Normal range of motion. Neck supple.  Pain with flexion and side to side movement, unable to  extend due to pain.   Cardiovascular: Normal rate, regular rhythm and normal heart sounds.   Pulmonary/Chest: Effort normal and breath sounds normal. No respiratory distress. She has no wheezes. She has no rales.  Abdominal: Soft. Bowel sounds are normal. She exhibits no distension. There is no tenderness. There is no rebound and no guarding.  Musculoskeletal: Normal range of motion. She exhibits tenderness (Overlying lower cervical spine ). She exhibits no edema.  Neurological: She is alert and oriented to person, place, and time. No cranial nerve deficit. Coordination normal.  Mildly reduced 4/5 left LE strength, otherwise 5/5 throughout. Normal sensation to  light touch.   Skin: Skin is warm and dry. No rash noted. She is not diaphoretic. No erythema. No pallor.  Psychiatric: She has a normal mood and affect. Her behavior is normal. Judgment and thought content normal.    Assessment & Plan:   Please see problem list for problem-based assessment and plan

## 2014-05-20 NOTE — Telephone Encounter (Signed)
Pt called - c/o of increase neck pain since MRI. Pt has been taking generic Mobic and Flexeril. Appt made today 3:45PM Dr Naaman Plummer. Trying to find transportation. Hilda Blades Subrena Devereux RN 05/20/14 2:30PM

## 2014-05-20 NOTE — Patient Instructions (Signed)
-  Instead of mobic start taking ibuprofen 800 mg every 8 hours as needed for pain, it will also help with inflammation -If your pain is uncontrolled start taking tramadol 50 mg every 12 hours as needed for pain -You should hear soon regarding your neurosurgery appointment, nice seeing you again!     General Instructions:   Please bring your medicines with you each time you come to clinic.  Medicines may include prescription medications, over-the-counter medications, herbal remedies, eye drops, vitamins, or other pills.   Progress Toward Treatment Goals:  No flowsheet data found.  Self Care Goals & Plans:  No flowsheet data found.  No flowsheet data found.   Care Management & Community Referrals:  No flowsheet data found.

## 2014-05-22 NOTE — Assessment & Plan Note (Signed)
Assessment: Pt with 1-year history of neck and left upper extremity pain with radiculopathy due to cervical disc degeneration and spondylosis with recent MRI revealing multilevel spondylosis with possible symptomatic right neural impingement who presents with worsening pain after MRI without concern for cord compression.   Plan:  -Prescribe ibuprofen 800 mg Q 8 hr PRN pain and inflammation (#60 with zero refills)  -Prescribe tramadol 50 mg BID PRN pain (#60 with zero refills)  -Discontinue meloxicam 7.5 mg daily and flexiriil 5 mg PRN due to inadequate response -Continue gabapentin 400 mg TID for neuropathy  -Pt awaiting appointment for neurosurgery referral (hopefully by next week)

## 2014-05-25 ENCOUNTER — Encounter: Payer: Self-pay | Admitting: Internal Medicine

## 2014-05-25 NOTE — Progress Notes (Signed)
Patient ID: Mary Jenkins, female   DOB: 22-Aug-1956, 58 y.o.   MRN: [id number]  I am completing paperwork today that requires my signature from [employer]:  05/13/14: re-evaluation of Protonix dose (53m BID)  05/24/14: re-evaluation of Protonix dose if she is at low risk for NSAID-induced ulcer  Dr. RNaaman Plummersaw the patient earlier this year and assessed her symptoms were well managed on this regimen, and I will defer alteration at this time.

## 2014-05-26 NOTE — Progress Notes (Signed)
INTERNAL MEDICINE TEACHING ATTENDING ADDENDUM - Aldine Contes, MD: I reviewed and discussed at the time of visit with the resident Dr. Naaman Plummer, the patient's medical history, physical examination, diagnosis and results of pertinent tests and treatment and I agree with the patient's care as documented.

## 2014-06-01 DIAGNOSIS — M5412 Radiculopathy, cervical region: Secondary | ICD-10-CM | POA: Diagnosis not present

## 2014-06-06 ENCOUNTER — Ambulatory Visit (INDEPENDENT_AMBULATORY_CARE_PROVIDER_SITE_OTHER): Payer: Medicare Other | Admitting: Internal Medicine

## 2014-06-06 ENCOUNTER — Telehealth: Payer: Self-pay | Admitting: Internal Medicine

## 2014-06-06 ENCOUNTER — Ambulatory Visit (INDEPENDENT_AMBULATORY_CARE_PROVIDER_SITE_OTHER)
Admission: RE | Admit: 2014-06-06 | Discharge: 2014-06-06 | Disposition: A | Discharge status: Home / Self Care | Payer: Medicare Other | Source: Ambulatory Visit | Attending: Internal Medicine | Admitting: Internal Medicine

## 2014-06-06 ENCOUNTER — Encounter: Payer: Self-pay | Admitting: Internal Medicine

## 2014-06-06 VITALS — BP 120/80 | HR 69 | Temp 98.2°F | Ht 65.5 in | Wt 129.0 lb

## 2014-06-06 DIAGNOSIS — J449 Chronic obstructive pulmonary disease, unspecified: Secondary | ICD-10-CM

## 2014-06-06 DIAGNOSIS — M7989 Other specified soft tissue disorders: Secondary | ICD-10-CM | POA: Diagnosis not present

## 2014-06-06 DIAGNOSIS — R0602 Shortness of breath: Secondary | ICD-10-CM | POA: Diagnosis not present

## 2014-06-06 DIAGNOSIS — Z23 Encounter for immunization: Secondary | ICD-10-CM

## 2014-06-06 NOTE — Progress Notes (Signed)
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 No real variability, cough is better with no excess or purulent sputum.  Not using saba daytime because finds if she's holding still doesn't need it as oftern. Some better p last  Prednisone rx. Not much variability. rec Stop atrovent and advair Start symbiocort Take 2 puffs first thing in am and then another 2 puffs about 12 hours later.  Take after only am dose Spiriva Protonix 40 mg Take 30-60 min before first meal of the day  GERD diet Only use your albuterol (Plan B= ventolin puffer,  Plan C is nebulizer) as a rescue medication to be used if you can't catch your breath by resting or doing a relaxed purse lip breathing pattern. The less you use it, the better it will work when you need it. Ok to use rescue up to every 4 hours if doing poorly.           03/22/2013 f/u ov/Rosellen Lichtenberger GOLD IV COPD Chief Complaint  Patient presents with  . Follow-up    with PFTs.  Was d/c'd from [facility] last week.  Breathing has improved since d/c but still recovering and building strength.  Has DOE and prod cough with white mucus.  No wheezing, chest tightness, or chest pain at this time.  Plan A = automatic symbicort and spiriva  Plan B = Backup, proventil / proaire up to 2 puffs every 4 hours  Plan C = nebulizer albuterol, only use if can't get relief sitting still and used plan B and if start needing nebulizer call asap to see me or Tammy NP same day. Plan D= Doctor Plan E = Er Work on inhaler technique:        04/19/2013 f/u ov/Vedansh Kerstetter re copd GOLD IV Chief Complaint  Patient presents with  . 4 wk follow up    Breathing has improved.  Does note DOE and occas chest pain.  No wheezing or cough at this time.  Not really limited by breathing while on symbiocort/ spiriva and rare need for saba hfa, never neb now  Cp is fleeting s pleuritic or exertional components.  rec No change in rx   06/21/13 R reconstruction by Georgia Lopes  07/19/2013 f/u ov/Kaidyn Hernandes re: GOLD IV COPD Chief Complaint  Patient presents with  . Follow-up    Pt states her breathing is doing well. CP has resolved. No new co's today.   nor limited by sob from desired activities, no need for any saba at all rec Plan A = automatic symbicort and spiriva  Plan B = backup Only use your albuterol as a rescue medication  Plan C = nebulizer with albuterol up to 4 hours but call for appointment right me or Tammy N   Phone call 06/06/14  Reports she feels "like I'm catching a cold."  C/o Increased DOE and wheezing.  Sxs started yesterday.   No chest tightness or cough. Has tried albuterol hfa and is taking maintanence meds.    06/06/2014  f/u ov/Heddy Vidana quit smoking 06/2012 re:  Chief Complaint  Patient presents with  . Acute Visit    Pt c/o increased DOE x 2 days- SOB with walking from room to room at home. She also has noticed wheezing off and on for the past few days. Not using rescue inhaler but did use neb 1 day ago.   onset of   "cold" symptoms 06/04/14= scratchy throat nasal congestion  baseline no need for the saba hfa or neb/ maint on spira/symbicort and acid rx Also new R leg pain behind knee  x one week, no swelling no h/o injury or prior dvt  No obvious daytime variabilty or assoc chronic cough or cp or chest tightness, or overt   hb symptoms. No unusual exp hx or h/o childhood pna/ asthma or premature birth to his knowledge.     Sleeping ok without nocturnal  or early am exacerbation  of respiratory  c/o's or need for noct  saba. Also denies any obvious fluctuation of symptoms with weather or environmental changes or other aggravating or alleviating factors except as outlined above.  Current Medications, Allergies, Past Medical History, Past Surgical History, Family History, and Social History were reviewed in [employer] record.  ROS  The following are not active complaints unless bolded sore throat, dysphagia, dental problems, itching, sneezing,  nasal congestion or excess/ purulent secretions, ear ache,   fever, chills, sweats, unintended wt loss, pleuritic or exertional cp, hemoptysis,  orthopnea pnd or leg swelling, presyncope, palpitations, heartburn, abdominal pain, anorexia, nausea, vomiting, diarrhea  or change in bowel or urinary habits, change in stools or urine, dysuria,hematuria,  rash, arthralgias, visual complaints, headache, numbness weakness or ataxia or problems with walking or coordination,  change in mood/affect or memory.             Objective:   Physical Exam  amb bf nad    Wt 120 02/13/2012  > 119 03/13/2012 > 04/22/2012 117 > 07/21/2012 122> 12/02/2012  131> 03/22/2013  131 > 04/19/2013  129  > 07/19/2013 131 > 06/06/2014 129   HEENT mild turbinate edema.  Oropharynx edentulous no excess pnd or cobblestoning. Mild thrush under lower dentures.  No JVD or cervical adenopathy. Mild accessory muscle hypertrophy. Trachea midline, nl thryroid. Chest was hyperinflated by percussion with diminished breath sounds and moderate increased exp time without wheeze. Hoover sign positive at mid inspiration. Regular rate and rhythm without murmur gallop or rub or increase P2 or edema.  Abd: no hsm, nl excursion. Ext warm without cyanosis or clubbing.      CXR  06/06/2014 : The lungs are again hyperinflated. Postsurgical changes are noted on the right. Cardiac shadow is within normal limits. No focal infiltrate is seen.        Assessment & Plan:

## 2014-06-06 NOTE — Patient Instructions (Addendum)
Only use your albuterol inhaler as a rescue medication to be used if you can't catch your breath by resting or doing a relaxed purse lip breathing pattern.  - The less you use it, the better it will work when you need it. - Ok to use up to 2 puffs  every 4 hours if you must but call for immediate appointment if use goes up over your usual need - Don't leave home without it !!  (think of it like the spare tire for your car)   Only use the nebulizer if not better after inhaler, ok to use up to every 4 hours if needed  Try advil/motrin/ibuprofen up to 800 mg with meals three times a day as needed for knee pain   Please see patient coordinator before you leave today  to schedule venous doppler on R  Flu shot today   Please remember to go to the   x-ray department downstairs for your tests - we will call you with the results when they are available.    Pulmonary follow up is as needed

## 2014-06-06 NOTE — Telephone Encounter (Signed)
Called, spoke with pt - Reports she feels "like I'm catching a cold."  C/o Increased DOE and wheezing.  Sxs started yesterday.   No chest tightness or cough. Has tried albuterol hfa and is taking maintanence meds.  OV scheduled with MW today at 11:45 am -- pt confirmed appt and is to seek emergency care if needed.

## 2014-06-08 ENCOUNTER — Telehealth: Payer: Self-pay | Admitting: Internal Medicine

## 2014-06-08 NOTE — Progress Notes (Signed)
Quick Note:  LMTCB ______ 

## 2014-06-08 NOTE — Assessment & Plan Note (Signed)
-  FEV1 0.39 ( 16%) ratio 23 and 16% better p B2    - 03/13/2012  Inhaler technique 90% with dpi but only 50% with mdi > 50% 04/22/12   - Alpha one genotype 02/13/12 > MM   - 03/22/2013  PFT's 0.56 ( 24%) ratio 33 but 12% better improvement and DLCO 21 corrects to 28%  -  hfa 90% 04/19/2013 p extensive coaching   Despite severity of copd she is actually doing quite well even in the setting of apparent uri but needs to remember better how to use her prns  See instructions for specific recommendations which were reviewed directly with the patient who was given a copy with highlighter outlining the key components.

## 2014-06-08 NOTE — Assessment & Plan Note (Signed)
Exam completely nl > Venous dopplers ordered in meantime rec nsaids

## 2014-06-08 NOTE — Telephone Encounter (Signed)
Spoke with pt and notified of results per Dr. Melvyn Novas. Pt verbalized understanding and denied any questions.

## 2014-06-08 NOTE — Progress Notes (Signed)
Quick Note:  Spoke with pt and notified of results per Dr. Melvyn Novas. Pt verbalized understanding and denied any questions.  ______

## 2014-06-09 ENCOUNTER — Ambulatory Visit (HOSPITAL_COMMUNITY): Payer: Medicare Other | Attending: Internal Medicine | Admitting: Cardiology

## 2014-06-09 DIAGNOSIS — Z87891 Personal history of nicotine dependence: Secondary | ICD-10-CM | POA: Diagnosis not present

## 2014-06-09 DIAGNOSIS — E785 Hyperlipidemia, unspecified: Secondary | ICD-10-CM | POA: Diagnosis not present

## 2014-06-09 DIAGNOSIS — J449 Chronic obstructive pulmonary disease, unspecified: Secondary | ICD-10-CM | POA: Diagnosis not present

## 2014-06-09 DIAGNOSIS — M7989 Other specified soft tissue disorders: Secondary | ICD-10-CM | POA: Insufficient documentation

## 2014-06-09 DIAGNOSIS — J4489 Other specified chronic obstructive pulmonary disease: Secondary | ICD-10-CM | POA: Diagnosis not present

## 2014-06-09 DIAGNOSIS — M79606 Pain in leg, unspecified: Secondary | ICD-10-CM

## 2014-06-09 DIAGNOSIS — M79609 Pain in unspecified limb: Secondary | ICD-10-CM

## 2014-06-09 DIAGNOSIS — R0602 Shortness of breath: Secondary | ICD-10-CM | POA: Diagnosis not present

## 2014-06-09 NOTE — Progress Notes (Signed)
Bilateral lower venous duplex performed  

## 2014-06-13 ENCOUNTER — Encounter: Payer: Self-pay | Admitting: Internal Medicine

## 2014-06-13 DIAGNOSIS — M5412 Radiculopathy, cervical region: Secondary | ICD-10-CM | POA: Diagnosis not present

## 2014-06-13 NOTE — Progress Notes (Signed)
Quick Note:  Spoke with pt and notified of results per Dr. Wert. Pt verbalized understanding and denied any questions.  ______ 

## 2014-06-27 ENCOUNTER — Other Ambulatory Visit: Payer: Self-pay | Admitting: *Deleted

## 2014-06-27 DIAGNOSIS — M5412 Radiculopathy, cervical region: Secondary | ICD-10-CM

## 2014-06-27 MED ORDER — GABAPENTIN 400 MG PO CAPS: 400.0000 mg | ORAL_CAPSULE | Freq: Three times a day (TID) | ORAL | Status: DC

## 2014-06-27 NOTE — Telephone Encounter (Signed)
As this patient was seen by me on 7/31 and deemed necessary for their treatment as documented in the EHR, I will refill this prescription for gabapentin 449m TID.

## 2014-07-06 DIAGNOSIS — C50911 Malignant neoplasm of unspecified site of right female breast: Secondary | ICD-10-CM | POA: Diagnosis not present

## 2014-07-07 ENCOUNTER — Encounter: Payer: Self-pay | Admitting: Internal Medicine

## 2014-07-07 ENCOUNTER — Ambulatory Visit (INDEPENDENT_AMBULATORY_CARE_PROVIDER_SITE_OTHER): Payer: Medicare Other | Admitting: Internal Medicine

## 2014-07-07 VITALS — BP 92/61 | HR 80 | Temp 98.2°F | Ht 66.6 in | Wt 129.8 lb

## 2014-07-07 DIAGNOSIS — J449 Chronic obstructive pulmonary disease, unspecified: Secondary | ICD-10-CM

## 2014-07-07 DIAGNOSIS — M5412 Radiculopathy, cervical region: Secondary | ICD-10-CM

## 2014-07-07 DIAGNOSIS — M546 Pain in thoracic spine: Secondary | ICD-10-CM | POA: Diagnosis not present

## 2014-07-07 NOTE — Assessment & Plan Note (Signed)
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 Seen by neurosurgery on 06/01/14 (and again 2-3 weeks later), given 2 week course of Prednisone which she has since completed. States her neck pain has almost completely resolved.  -Encouraged her to continue w/ neck exercises as instructed by neurosurgery.

## 2014-07-07 NOTE — Patient Instructions (Signed)
General Instructions:  1. Please schedule a follow up appointment for 4 weeks to reassess back pain.  2. Please take all medications as prescribed. Take Ibuprofen 800 mg three times daily as needed for pain. Take Tramadol 50 mg one or two times daily for breakthrough pain.   3. If you have worsening of your symptoms or new symptoms arise, please call the clinic ([phone]), or go to the ER immediately if symptoms are severe.   Please bring your medicines with you each time you come to clinic.  Medicines may include prescription medications, over-the-counter medications, herbal remedies, eye drops, vitamins, or other pills.   Shingles Shingles (herpes zoster) is an infection that is caused by the same virus that causes chickenpox (varicella). The infection causes a painful skin rash and fluid-filled blisters, which eventually break open, crust over, and heal. It may occur in any area of the body, but it usually affects only one side of the body or face. The pain of shingles usually lasts about 1 month. However, some people with shingles may develop long-term (chronic) pain in the affected area of the body. Shingles often occurs many years after the person had chickenpox. It is more common:  In people older than 50 years.  In people with weakened immune systems, such as those with HIV, AIDS, or cancer.  In people taking medicines that weaken the immune system, such as transplant medicines.  In people under great stress. CAUSES  Shingles is caused by the varicella zoster virus (VZV), which also causes chickenpox. After a person is infected with the virus, it can remain in the person's body for years in an inactive state (dormant). To cause shingles, the virus reactivates and breaks out as an infection in a nerve root. The virus can be spread from person to person (contagious) through contact with open blisters of the shingles rash. It will only spread to people who have not had chickenpox. When  these people are exposed to the virus, they may develop chickenpox. They will not develop shingles. Once the blisters scab over, the person is no longer contagious and cannot spread the virus to others. SIGNS AND SYMPTOMS  Shingles shows up in stages. The initial symptoms may be pain, itching, and tingling in an area of the skin. This pain is usually described as burning, stabbing, or throbbing.In a few days or weeks, a painful red rash will appear in the area where the pain, itching, and tingling were felt. The rash is usually on one side of the body in a band or belt-like pattern. Then, the rash usually turns into fluid-filled blisters. They will scab over and dry up in approximately 2-3 weeks. Flu-like symptoms may also occur with the initial symptoms, the rash, or the blisters. These may include:  Fever.  Chills.  Headache.  Upset stomach. DIAGNOSIS  Your health care provider will perform a skin exam to diagnose shingles. Skin scrapings or fluid samples may also be taken from the blisters. This sample will be examined under a microscope or sent to a lab for further testing. TREATMENT  There is no specific cure for shingles. Your health care provider will likely prescribe medicines to help you manage the pain, recover faster, and avoid long-term problems. This may include antiviral drugs, anti-inflammatory drugs, and pain medicines. HOME CARE INSTRUCTIONS   Take a cool bath or apply cool compresses to the area of the rash or blisters as directed. This may help with the pain and itching.   Take medicines  only as directed by your health care provider.   Rest as directed by your health care provider.  Keep your rash and blisters clean with mild soap and cool water or as directed by your health care provider.  Do not pick your blisters or scratch your rash. Apply an anti-itch cream or numbing creams to the affected area as directed by your health care provider.  Keep your shingles rash  covered with a loose bandage (dressing).  Avoid skin contact with:  Babies.   Pregnant women.   Children with eczema.   Elderly people with transplants.   People with chronic illnesses, such as leukemia or AIDS.   Wear loose-fitting clothing to help ease the pain of material rubbing against the rash.  Keep all follow-up visits as directed by your health care provider.If the area involved is on your face, you may receive a referral for a specialist, such as an eye doctor (ophthalmologist) or an ear, nose, and throat (ENT) doctor. Keeping all follow-up visits will help you avoid eye problems, chronic pain, or disability.  SEEK IMMEDIATE MEDICAL CARE IF:   You have facial pain, pain around the eye area, or loss of feeling on one side of your face.  You have ear pain or ringing in your ear.  You have loss of taste.  Your pain is not relieved with prescribed medicines.   Your redness or swelling spreads.   You have more pain and swelling.  Your condition is worsening or has changed.   You have a fever. MAKE SURE YOU:  Understand these instructions.  Will watch your condition.  Will get help right away if you are not doing well or get worse. Document Released: 09/09/2005 Document Revised: 01/24/2014 Document Reviewed: 04/23/2012 [facility] Patient Information 2015 [place], [place]. This information is not intended to replace advice given to you by your health care provider. Make sure you discuss any questions you have with your health care provider.

## 2014-07-07 NOTE — Assessment & Plan Note (Signed)
Recently seen by Dr. Melvyn Novas. Notes no issues w/ her breathing lately.

## 2014-07-07 NOTE — Assessment & Plan Note (Addendum)
Superficial and deep tenderness to palpation over area of T4-T6 to the right of her spine. No obvious rash or erythema. Suspect that this is most likely musculoskeletal in nature given physical exam findings. Some consideration for shingles, however, no obvious lesions to suggest this. Discussed this at length with patient, if she does notice rash over area, to return to clinic for antiviral therapy to decrease duration of infection.  -Encouraged ibuprofen use w/ tramadol for breakthrough.  -Also discussed using heat pack when sitting at home and continuation of normal activity. -Will have patient return in 2-4 weeks for re-evalution

## 2014-07-07 NOTE — Progress Notes (Signed)
Subjective:   Patient ID: Mary Jenkins female   DOB: 1956/05/27 58 y.o.   MRN: [id number]  HPI: Ms. Mary Jenkins is a 58 y.o. female w/ PMHx of severe COPD, GERD, cervical DDD, h/o breast CA, and anemia, presents to the clinic today for an acute visit for back pain. This pain has been present for about 2 weeks, is located in the middle/upper part of her back to the right of her spine, and is tender to the touch. She does not recall an inciting event that may have caused her pain to start. She states that it has made it difficult for her to lie on her right side. She notes no recent rash or lesions over this area of her back. No fever, chills, weakness in her upper or lower extremities, no urinary or fecal incontinence. She also denies hematuria, dysuria, or abdominal pain. She also had a right-sided mastectomy several years ago and is now undergoing reconstructive surgery for this.  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 Given these findings, the patient was referred to neurosurgery w/ Dr. Hal Neer on 06/01/14 and was given a 2 week course of Prednisone which she has since completed. Her neck pain has almost completely resolved, according to the patient.    Past Medical History  Diagnosis Date  . Personal history of chemotherapy 1997  . Insomnia 06/24/2011  . GERD (gastroesophageal reflux disease) 02/03/2012  . 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  Separate hypoattenuating lesions within the liver, favored to represent small cysts or complex cyst. Too small to entirely characterize.   CT abdomen/pelvis 11/2011. No significant change in size and appearance of small  6 mm hypervascular lesion in segment 4A of the liver, again favored to  represent a benign perfusion anomaly. 2. Also unchanged is a 7 mm low attenuation lesion in segment 8 of  the liver, which is too small to characterize, but is favored to represent a small cyst or biliary hamartoma. 3. Small amount of pneumobilia in the left lobe of the liver, as above, new compared to the prior examination 06/14/2011. This is of uncertain etiology and significance, but is a common finding in patients who have had prior sphincterotomy (clinical correlation is recommended).     . COPD (chronic obstructive pulmonary disease) 06/24/2011  . Emphysema   . Exertional shortness of breath   . DDD (degenerative disc disease), cervical   . Wears glasses   . Full dentures   . Breast cancer 1997    chemo-no PAC  . Anemia    Current Outpatient Prescriptions  Medication Sig Dispense Refill  . albuterol (PROVENTIL HFA;VENTOLIN HFA) 108 (90 BASE) MCG/ACT inhaler Inhale 2 puffs into the lungs every 6 (six) hours as needed for wheezing.  1 Inhaler  1  . albuterol (PROVENTIL) (2.5 MG/3ML) 0.083% nebulizer solution Take 2.5 mg by nebulization every 6 (six) hours as needed for wheezing. For wheezing      . budesonide-formoterol (SYMBICORT) 160-4.5 MCG/ACT inhaler Inhale 2 puffs into the lungs 2 (two) times daily.  3 Inhaler  1  . famotidine (PEPCID) 10 MG tablet Take 10 mg by mouth at bedtime.      Marland Kitchen  gabapentin (NEURONTIN) 400 MG capsule Take 1 capsule (400 mg total) by mouth 3 (three) times daily.  90 capsule  6  . ibuprofen (ADVIL,MOTRIN) 800 MG tablet Take 1 tablet (800 mg total) by mouth every 8 (eight) hours as needed.  60 tablet  0  . omeprazole (PRILOSEC) 20 MG capsule Take 1 capsule (20 mg total) by mouth 2 (two) times daily.  60 capsule  6  . predniSONE (DELTASONE) 10 MG tablet Take 10 mg by mouth daily with breakfast.      . tiotropium (SPIRIVA) 18 MCG inhalation capsule Place 1 capsule (18 mcg total) into inhaler and  inhale daily.  30 capsule  6  . traMADol (ULTRAM) 50 MG tablet Take 1 tablet (50 mg total) by mouth every 12 (twelve) hours as needed.  60 tablet  0   No current facility-administered medications for this visit.    Review of Systems: General: Positive for fatigue. Denies fever, chills, diaphoresis, appetite change.  Respiratory: Denies SOB, DOE, cough, and wheezing.   Cardiovascular: Denies chest pain and palpitations.  Gastrointestinal: Denies nausea, vomiting, abdominal pain, and diarrhea.  Genitourinary: Denies dysuria, increased frequency, and flank pain. Endocrine: Denies hot or cold intolerance, polyuria, and polydipsia. Musculoskeletal: Positive for myalgias and back pain. Denies joint swelling, arthralgias and gait problem.  Skin: Denies pallor, rash and wounds.  Neurological: Denies dizziness, seizures, syncope, weakness, lightheadedness, numbness and headaches.  Psychiatric/Behavioral: Denies mood changes, and sleep disturbances.   Objective:   Physical Exam: Filed Vitals:   07/07/14 0838  BP: 92/61  Pulse: 80  Temp: 98.2 F (36.8 C)  TempSrc: Oral  Height: 5' 6.6" (1.692 m)  Weight: 129 lb 12.8 oz (58.877 kg)  SpO2: 100%    General: Alert, cooperative, NAD. HEENT: PERRL, EOMI. Moist mucus membranes Neck: Full range of motion without pain, supple, no lymphadenopathy or carotid bruits Lungs: Clear to ascultation bilaterally, normal work of respiration, no wheezes, rales, rhonchi Heart: RRR, no murmurs, gallops, or rubs Abdomen: Soft, non-tender, non-distended, BS + Back: No spinal tenderness of ROM limitations. Tenderness to superficial palpation inferior to the right scapula. No obvious erythema or skin lesions.  Extremities: No cyanosis, clubbing, or edema Neurologic: Alert & oriented X3, cranial nerves II-XII intact, strength grossly intact, sensation intact to light touch   Assessment & Plan:   Please see problem based assessment and plan.

## 2014-07-08 NOTE — Progress Notes (Signed)
Case discussed with Dr. Ronnald Ramp at the time of the visit.  We reviewed the resident's history and exam and pertinent patient test results.  I agree with the assessment, diagnosis and plan of care documented in the resident's note.

## 2014-07-20 ENCOUNTER — Encounter: Payer: Self-pay | Admitting: Internal Medicine

## 2014-07-20 NOTE — Progress Notes (Unsigned)
Patient ID: Mary Jenkins, female   DOB: 04/23/1956, 58 y.o.   MRN: [id number]  I am completing paperwork today from [place] that requires my signature:  Received 07/14/14: authorization for a tub seat. I could not find documentation for this equipment in the EHR and called her. She told me that she had made the request to an RN in the clinic and that it was due to her chronic neck pain. She had already received it and found it quite helpful. I will therefore sign off on it.

## 2014-07-25 ENCOUNTER — Telehealth: Payer: Self-pay | Admitting: Internal Medicine

## 2014-07-25 NOTE — Telephone Encounter (Signed)
Since pt only needs to see [place] as needed.  Called pt, made her aware she should let her PCP know of the change come 09/23/14 since she no longer needs to see [place]. She will do so. Nothing further needed

## 2014-07-29 ENCOUNTER — Encounter: Payer: Self-pay | Admitting: Internal Medicine

## 2014-07-29 NOTE — Progress Notes (Signed)
Patient ID: Mary Jenkins, female   DOB: [birth year], 58 y.o.   MRN: [id number]  I am completing paperwork today from [place] that requires my signature:  Received 07/25/14: Authorization of her tub seat she received back in June 2015

## 2014-08-05 ENCOUNTER — Encounter: Payer: Self-pay | Admitting: Internal Medicine

## 2014-08-05 ENCOUNTER — Ambulatory Visit (INDEPENDENT_AMBULATORY_CARE_PROVIDER_SITE_OTHER): Payer: Medicare Other | Admitting: Internal Medicine

## 2014-08-05 VITALS — BP 121/80 | HR 76 | Temp 98.1°F | Ht 65.5 in | Wt 129.6 lb

## 2014-08-05 DIAGNOSIS — M79604 Pain in right leg: Secondary | ICD-10-CM | POA: Diagnosis not present

## 2014-08-05 DIAGNOSIS — J449 Chronic obstructive pulmonary disease, unspecified: Secondary | ICD-10-CM | POA: Diagnosis not present

## 2014-08-05 DIAGNOSIS — M546 Pain in thoracic spine: Secondary | ICD-10-CM

## 2014-08-05 DIAGNOSIS — M5412 Radiculopathy, cervical region: Secondary | ICD-10-CM

## 2014-08-05 NOTE — Assessment & Plan Note (Signed)
Overview Pain has since resolved mainly with heat compresses. She did not like how Tramadol made her feel and felt it really made her drowsy for up 2 days. She is afraid to take ibuprofen frequently given that she was told that her kidney function was not where it should be in the past.   Assessment -CKD Stage 2 per recent BMETs though infrequent ibuprofen acceptable -Conservative management appeared to have helped  Plan -Educated her on appropriateness of ibuprofen 200-456m prn for pain for future pain

## 2014-08-05 NOTE — Progress Notes (Signed)
Patient ID: Mary Jenkins, female   DOB: 07/09/1956, 57 y.o.   MRN: [id number]  Internal Medicine Clinic Attending  I saw and evaluated the patient.  I personally confirmed the key portions of the history and exam documented by Dr. Posey Pronto and I reviewed pertinent patient test results.  The assessment, diagnosis, and plan were formulated together and I agree with the documentation in the resident's note.

## 2014-08-05 NOTE — Assessment & Plan Note (Signed)
Overview Neck pain has returned for the last 1-2 weeks though not nearly as severe. It only bothers her when she drops her neck. She plans to follow-up with Neurosurgery.   Assessment -Much improved from when I first saw her back in July  Plan -Collect records from Neurosurgery

## 2014-08-05 NOTE — Assessment & Plan Note (Signed)
Overview Per her pulmonologist's office (Dr. Melvyn Novas), she only needs to follow-up with them as needed and can fill those medications through our clinic. She presents me with a letter from her insurance which reports that the following changes will be made to the formulary in 2016: Her current regimen is Symbicort 2 puffs BID, Spiriva nebs, and albuterol inhaler/nebs prn.  Albuterol inhaler->Ventolin HFA Spiriva nebs->Incruse ellipta  Assessment -Already receiving Ventolin HFA -Unsure of Incruse ellipta and how it's prescribed  Plan -Ask Dr. Melvyn Novas about Incruse ellipta and it's interchangeability with Spiriva

## 2014-08-05 NOTE — Assessment & Plan Note (Signed)
Overview She reports having pain in her right leg over the past week that's unrelated to activity. It's worse after palpation as she discovered while "mashing on it to find a knot." She does not report any erythema, fever, trauma about the site.  Assessment -Differential includes muscle cramps, DVT (less likely given physical exam findings), claudication (less likely given that it's not associated with activity though she does have h/o smoking)  Plan -Recommend ibuprofen 251m-400mg prn for pain -Reassess at follow-up visit

## 2014-08-05 NOTE — Progress Notes (Signed)
   Subjective:    Patient ID: Mary Jenkins, female    DOB: 04/02/1956, 58 y.o.   MRN: [id number]  HPI Mary Jenkins is a 58 year old woman with COPD GOLD Stage IV, cervical radiculopathy, GERD, R breast cancer s/p mastectomy (1997), chemo, and corrective reconstruction who presents today for follow-up visit.  Please see assessment & plan for documentation of each problem.  Review of Systems  Constitutional: Negative for fever and appetite change.  Respiratory: Negative for shortness of breath.   Cardiovascular: Negative for chest pain and leg swelling.  Musculoskeletal: Positive for myalgias and neck pain.       Chronic but stable       Objective:   Physical Exam Constitutional: She is oriented to person, place, and time. She appears well-developed and well-nourished. No distress.  HENT:  Head: Normocephalic and atraumatic.  Eyes: Conjunctivae are normal. Pupils are equal, round, and reactive to light.  Cardiovascular: Normal rate, regular rhythm and normal heart sounds.  Exam reveals no gallop and no friction rub.   No murmur heard. Pulmonary/Chest: Effort normal. No respiratory distress. She has no wheezes. She has no rales.  Abdominal: Soft. Bowel sounds are normal. She exhibits no distension. There is no tenderness.  Neurological: She is alert and oriented to person, place, and time. No cranial nerve deficit. Coordination normal.  Skin: Skin is warm and dry. She is not diaphoretic.  Extremities: No asymmetry noted between both LE. Tenderness to palpation noted over anteriomedial R leg but limited to the region between tibia and posterior leg. PT pulses weak but equal b/t both legs. No signs of cyanosis or erythema.  Psychiatric: Her behavior is normal.         Assessment & Plan:

## 2014-08-05 NOTE — Patient Instructions (Addendum)
It was a pleasure seeing you today.   Please continue taking ibuprofen 200-425m as needed.   I will call Dr. WMelvyn Novasabout IIantha Fallen  Your next appointment is Friday, 10/21/2014 @ 1:15pm.  Please bring your medicines with you each time you come to clinic.  Medicines may include prescription medications, over-the-counter medications, herbal remedies, eye drops, vitamins, or other pills.

## 2014-08-29 ENCOUNTER — Telehealth: Payer: Self-pay | Admitting: *Deleted

## 2014-08-29 NOTE — Telephone Encounter (Signed)
Pt called stating she  Is taking Spiriva and has thrush in her mouth. She would like something called in for this. Pt last visit 11/13 Would you like pt seen for this? Pt # [phone]

## 2014-08-29 NOTE — Telephone Encounter (Signed)
Pt informed and she will call in AM and make an appointment.

## 2014-08-29 NOTE — Telephone Encounter (Signed)
Yes... I think she would benefit from an appointment. Spiriva should not cause thrush unless she is on a steroid. Thank you

## 2014-08-31 ENCOUNTER — Ambulatory Visit (INDEPENDENT_AMBULATORY_CARE_PROVIDER_SITE_OTHER): Payer: Medicare Other | Admitting: Internal Medicine

## 2014-08-31 ENCOUNTER — Encounter: Payer: Self-pay | Admitting: Internal Medicine

## 2014-08-31 VITALS — BP 117/75 | HR 78 | Temp 97.9°F | Ht 65.5 in | Wt 129.2 lb

## 2014-08-31 DIAGNOSIS — Z87891 Personal history of nicotine dependence: Secondary | ICD-10-CM

## 2014-08-31 DIAGNOSIS — K121 Other forms of stomatitis: Secondary | ICD-10-CM

## 2014-08-31 NOTE — Assessment & Plan Note (Addendum)
Pt concerned that she has thrush given h/o thrush, which caused pain with eating. He pain is localized to posterior mandible. On exam there is a small 90m ulcer at base of posterior mandible. Intact, clean, with mild surrounding erythema. Does not appear infected. Likely from dentures, which she states do move with eating. Recommended keeping dentures and mouth clean with brushing 2 times daily, and using a fixation product for her dentures to prevent movement.

## 2014-08-31 NOTE — Progress Notes (Signed)
Medicine attending: Medical history, presenting problems, physical findings, and medications, reviewed with resident physician Dr. Lennart Pall and I concur with her evaluation and management plan.

## 2014-08-31 NOTE — Patient Instructions (Signed)
Be sure to rinse out your mouth or brush your teeth after using the Symbicort inhaler If you develop any white patches in your mouth, please give [place] a call as this might be thrush. If you develop any fevers, chills, increased SOB, productive cough, or wheezing, please give [place] a call   General Instructions:   Please bring your medicines with you each time you come to clinic.  Medicines may include prescription medications, over-the-counter medications, herbal remedies, eye drops, vitamins, or other pills.

## 2014-08-31 NOTE — Progress Notes (Signed)
Patient ID: Mary Jenkins, female   DOB: 25-Sep-1955, 58 y.o.   MRN: [id number]  Subjective:   Patient ID: Mary Jenkins female   DOB: 1956-02-25 58 y.o.   MRN: [id number]  HPI: [name] is a 58 y.o. F w/ PMH GERD, dyslipidemia, and COPD GOLD IV, presents with thrush.  Per pt, she has a h/o thursh and she thinks she has another bought of it. She is currently taking an inhaled steroid- Symbicort. She was told by her pulmonologist that she needs to wash her mouth out well after usage of her inhaled steroid. She does have dentures.   Pt denies fevers, chills, headaches, vision changes, chest pain, palpitations, wheezing, abdominal pain, nausea, vomiting, difficulty or pain with urination, peripheral edema, or difficulty ambulating. +Thrush in mouth, +DOE   Past Medical History  Diagnosis Date  . Personal history of chemotherapy 1997  . Insomnia 06/24/2011  . GERD (gastroesophageal reflux disease) 02/03/2012  . 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  Separate hypoattenuating lesions within the liver, favored to represent small cysts or complex cyst. Too small to entirely characterize.   CT abdomen/pelvis 11/2011. 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 2. Also unchanged is a 7 mm low attenuation lesion in segment 8 of  the liver, which is too small to characterize, but is favored to represent a small cyst or biliary hamartoma. 3. Small amount of pneumobilia in the left lobe of the liver, as above, new compared to the prior examination 06/14/2011. This is of uncertain etiology and significance, but is a common finding in patients who have had prior sphincterotomy (clinical correlation is recommended).     . COPD (chronic  obstructive pulmonary disease) 06/24/2011  . Emphysema   . Exertional shortness of breath   . DDD (degenerative disc disease), cervical   . Wears glasses   . Full dentures   . Breast cancer 1997    chemo-no PAC  . Anemia    Current Outpatient Prescriptions  Medication Sig Dispense Refill  . albuterol (PROVENTIL HFA;VENTOLIN HFA) 108 (90 BASE) MCG/ACT inhaler Inhale 2 puffs into the lungs every 6 (six) hours as needed for wheezing. 1 Inhaler 1  . albuterol (PROVENTIL) (2.5 MG/3ML) 0.083% nebulizer solution Take 2.5 mg by nebulization every 6 (six) hours as needed for wheezing. For wheezing    . budesonide-formoterol (SYMBICORT) 160-4.5 MCG/ACT inhaler Inhale 2 puffs into the lungs 2 (two) times daily. 3 Inhaler 1  . famotidine (PEPCID) 10 MG tablet Take 10 mg by mouth at bedtime.    . gabapentin (NEURONTIN) 400 MG capsule Take 1 capsule (400 mg total) by mouth 3 (three) times daily. 90 capsule 6  . ibuprofen (ADVIL,MOTRIN) 800 MG tablet Take 1 tablet (800 mg total) by mouth every 8 (eight) hours as needed. 60 tablet 0  . omeprazole (PRILOSEC) 20 MG capsule Take 1 capsule (20 mg total) by mouth 2 (two) times daily. 60 capsule 6  . tiotropium (SPIRIVA) 18 MCG inhalation capsule Place 1 capsule (18 mcg total) into inhaler and inhale daily. 30 capsule 6  . traMADol (ULTRAM) 50 MG tablet Take 1 tablet (50 mg total) by mouth every 12 (twelve) hours as needed. 60 tablet  0   No current facility-administered medications for this visit.   Family History  Problem Relation Age of Onset  . Hypertension Sister   . Colon cancer Neg Hx   . Esophageal cancer Neg Hx   . Stomach cancer Neg Hx   . Heart disease Brother   . Heart disease Brother   . Heart disease Brother   . Hypertension Mother    History   Social History  . Marital Status: Single    Spouse Name: N/A    Number of Children: N/A  . Years of Education: N/A   Social History Main Topics  . Smoking status: Former Smoker -- 5.00  packs/day for 41 years    Types: Cigarettes    Quit date: 07/23/2012  . Smokeless tobacco: Never Used  . Alcohol Use: No  . Drug Use: No     Comment: I6/20/2014 "n the past cocaine abuse; quit before I quit smoking in 06/2012"  . Sexual Activity: None   Other Topics Concern  . None   Social History Narrative   Review of Systems: A 12 point ROS was performed; pertinent positives and negatives were noted in the HPI   Objective:  Physical Exam: Filed Vitals:   08/31/14 1058  Height: 5' 5.5" (1.664 m)  Weight: 129 lb 3.2 oz (58.605 kg)   Constitutional: Vital signs reviewed.  Patient is a well-developed and well-nourished female in no acute distress and cooperative with exam. Alert and oriented x3.  Head: Normocephalic and atraumatic Mouth: No erythema or exudates, MMM. Small 36m ulcer at base of posterior mandible; intact, clean, with mild surrounding erythema Eyes: PERRL, EOMI Neck: Supple, Trachea midline, normal ROM, No JVD, mass, thyromegaly. Cardiovascular: RRR, no MRG, pulses symmetric and intact bilaterally Pulmonary/Chest: No respiratory distress, poor air movement, no wheezing, rales, or rhonchi present Abdominal: Soft. Non-tender, non-distended Musculoskeletal: Moves all 4 extremities  Hematology: No cervical adenopathy.  Neurological: A&O x3, cranial nerve II-XII are grossly intact, no focal motor deficit. Skin: Warm, dry and intact.   Psychiatric: Normal mood and affect. Speech and behavior is normal.   Assessment & Plan:   Please refer to Problem List based Assessment and Plan

## 2014-09-05 ENCOUNTER — Other Ambulatory Visit: Payer: Self-pay | Admitting: *Deleted

## 2014-09-05 MED ORDER — BUDESONIDE-FORMOTEROL FUMARATE 160-4.5 MCG/ACT IN AERO: 2.0000 | INHALATION_SPRAY | Freq: Two times a day (BID) | RESPIRATORY_TRACT | Status: DC

## 2014-09-09 NOTE — Telephone Encounter (Signed)
As this patient was seen by me on 11/13 and deemed necessary for their treatment as documented in the EHR, I will refill this prescription for Symbicort.

## 2014-10-05 ENCOUNTER — Other Ambulatory Visit: Payer: Self-pay | Admitting: *Deleted

## 2014-10-05 DIAGNOSIS — C50911 Malignant neoplasm of unspecified site of right female breast: Secondary | ICD-10-CM | POA: Diagnosis not present

## 2014-10-05 MED ORDER — OMEPRAZOLE 20 MG PO CPDR: 20.0000 mg | DELAYED_RELEASE_CAPSULE | Freq: Two times a day (BID) | ORAL | Status: DC

## 2014-10-05 NOTE — Telephone Encounter (Signed)
As this patient was seen by me on 11/13 and deemed necessary for their treatment as documented in the EHR, I will refill this prescription for Prilosec.

## 2014-10-13 ENCOUNTER — Other Ambulatory Visit: Payer: Self-pay | Admitting: *Deleted

## 2014-10-13 DIAGNOSIS — J441 Chronic obstructive pulmonary disease with (acute) exacerbation: Secondary | ICD-10-CM

## 2014-10-14 MED ORDER — ALBUTEROL SULFATE HFA 108 (90 BASE) MCG/ACT IN AERS: 2.0000 | INHALATION_SPRAY | Freq: Four times a day (QID) | RESPIRATORY_TRACT | Status: DC | PRN

## 2014-10-14 NOTE — Telephone Encounter (Signed)
As this patient was seen by me on 08/05/14 and deemed necessary for their treatment as documented in the EHR, I will refill this prescription for albuterol inhalers.

## 2014-10-21 ENCOUNTER — Ambulatory Visit (INDEPENDENT_AMBULATORY_CARE_PROVIDER_SITE_OTHER): Payer: Medicare Other | Admitting: Internal Medicine

## 2014-10-21 ENCOUNTER — Encounter: Payer: Self-pay | Admitting: Internal Medicine

## 2014-10-21 ENCOUNTER — Ambulatory Visit (HOSPITAL_COMMUNITY)
Admission: RE | Admit: 2014-10-21 | Discharge: 2014-10-21 | Disposition: A | Discharge status: Home / Self Care | Payer: Medicare Other | Source: Ambulatory Visit | Attending: Internal Medicine | Admitting: Internal Medicine

## 2014-10-21 ENCOUNTER — Ambulatory Visit (HOSPITAL_COMMUNITY): Payer: Medicare Other

## 2014-10-21 VITALS — BP 108/80 | HR 85 | Temp 98.1°F | Ht 65.5 in | Wt 130.1 lb

## 2014-10-21 DIAGNOSIS — R0789 Other chest pain: Secondary | ICD-10-CM | POA: Diagnosis not present

## 2014-10-21 DIAGNOSIS — J449 Chronic obstructive pulmonary disease, unspecified: Secondary | ICD-10-CM | POA: Diagnosis not present

## 2014-10-21 DIAGNOSIS — H269 Unspecified cataract: Secondary | ICD-10-CM

## 2014-10-21 DIAGNOSIS — Z853 Personal history of malignant neoplasm of breast: Secondary | ICD-10-CM | POA: Diagnosis not present

## 2014-10-21 DIAGNOSIS — Z1231 Encounter for screening mammogram for malignant neoplasm of breast: Secondary | ICD-10-CM

## 2014-10-21 DIAGNOSIS — Z8639 Personal history of other endocrine, nutritional and metabolic disease: Secondary | ICD-10-CM

## 2014-10-21 LAB — POCT GLYCOSYLATED HEMOGLOBIN (HGB A1C): Hemoglobin A1C: 5.8

## 2014-10-21 MED ORDER — ASPIRIN EC 81 MG PO TBEC: 81.0000 mg | DELAYED_RELEASE_TABLET | Freq: Every day | ORAL | Status: DC

## 2014-10-21 MED ORDER — LOVASTATIN 20 MG PO TABS: 20.0000 mg | ORAL_TABLET | Freq: Every day | ORAL | Status: DC

## 2014-10-21 NOTE — Patient Instructions (Addendum)
General Instructions:   Please try to bring all your medicines next time. This will help [place] keep you safe from mistakes.   For your chest pain, please take aspirin 69m and lovastatin 288m   If you have any reactions, please call [phone] speak with the MD on call.

## 2014-10-21 NOTE — Progress Notes (Signed)
   Subjective:    Patient ID: Mary Jenkins, female    DOB: Apr 24, 1956, 59 y.o.   MRN: [id number]  HPI Mary Jenkins is a 59 year old woman with COPD GOLD Stage IV, cervical radiculopathy, GERD, R breast cancer s/p mastectomy (1997), chemo, and corrective reconstruction who presents today for follow-up visit.    Review of Systems  Constitutional: Negative for fever.  Eyes: Positive for visual disturbance.  Respiratory: Negative for shortness of breath.   Cardiovascular: Positive for chest pain.  Gastrointestinal: Negative for nausea, abdominal pain and diarrhea.       Objective:   Physical Exam Constitutional: She is oriented to person, place, and time. She appears well-developed and well-nourished. No distress.  HENT:  Head: Normocephalic and atraumatic.  Eyes: Conjunctivae are normal. Pupils are equal, round, and reactive to light.  Cardiovascular: Normal rate, regular rhythm and normal heart sounds.  Exam reveals no gallop and no friction rub.   No murmur heard. Pulmonary/Chest: Effort normal. No respiratory distress. She has no wheezes. She has no rales.  Abdominal: Soft. Bowel sounds are normal. She exhibits no distension. There is no tenderness.  Neurological: She is alert and oriented to person, place, and time. No cranial nerve deficit. Coordination normal.  Skin: Skin is warm and dry. She is not diaphoretic.  Psychiatric: Her behavior is normal.        Assessment & Plan:

## 2014-10-21 NOTE — Progress Notes (Signed)
Internal Medicine Clinic Attending  Case discussed with Dr. Patel at the time of the visit.  We reviewed the resident's history and exam and pertinent patient test results.  I agree with the assessment, diagnosis, and plan of care documented in the resident's note.  

## 2014-10-24 LAB — GLUCOSE, CAPILLARY: Glucose-Capillary: 77 mg/dL (ref 70–99)

## 2014-10-24 NOTE — Assessment & Plan Note (Signed)
-  Due for mammogram so will go ahead and reorder

## 2014-10-24 NOTE — Assessment & Plan Note (Addendum)
Overview Occurs 2-3 times/week for the months. Not associated with exertion, rest, or breathing; occurs sporadically. Located under her L breast and forces her to stop whatever she is doing. Has GERD and does not feel pain in consistent. Female relatives in her family have hear disease, including two brothers one of which had first MI in his 20s while other brother had in 50s/60s and has undergone bypass surgery. Quit smoking two years ago though did smoke 5 packs x 41 years. No recent stressors.   Assessment -Atypical in nature -ACS less likely given alleviating factors though her family history and prior smoking history are risk factors -Not pleuritic thus making pericarditis or PNA less likely -GERD possible though less likely given that pain is not consistent with reflux sensation nor is related to eating.  Plan -Check EKG -Refer to Cardiology -Start ASA 31m & statin given dyslipidemia  -Check A1c  ADDENDUM 10/24/14 9:17 PM:  -EKG with normal sinus rhythm, no Q waves -A1c reassuring for no DM2 -Reassured and referred to Cardiology for stress test

## 2014-10-24 NOTE — Assessment & Plan Note (Signed)
-  Reports she has been told she has cataracts by her former eye doctor who recently moved. -Also notes new vision change and has been 9 months since her last appointment -Will refer her to a new one for further evaluation

## 2014-10-24 NOTE — Assessment & Plan Note (Signed)
Completed disability parking paperwork for her to allow her to park closer to the clinic for easier accessibility

## 2014-11-01 ENCOUNTER — Encounter: Payer: Self-pay | Admitting: Internal Medicine

## 2014-11-01 NOTE — Progress Notes (Signed)
Patient ID: Mary Jenkins, female   DOB: [birth year], 59 y.o.   MRN: [id number]  I am completing paperwork today from [facility] script that requires my signature:  Received 10/11/2014: Reassessment for the need of long standing dose of omeprazole given that GERD is uncomplicated  I will revisit this at her next appointment. In my prior clinic encounters with her, she feels this dose is adequate to treat her symptoms

## 2014-11-07 ENCOUNTER — Telehealth: Payer: Self-pay | Admitting: Internal Medicine

## 2014-11-07 NOTE — Telephone Encounter (Signed)
Called and spoke with pt and she stated that the spiriva is not covered by the formulary.  Pt will call her insurance company to see what will be covered.  Pt to call back.

## 2014-11-08 ENCOUNTER — Other Ambulatory Visit: Payer: Self-pay | Admitting: *Deleted

## 2014-11-09 DIAGNOSIS — H25813 Combined forms of age-related cataract, bilateral: Secondary | ICD-10-CM | POA: Diagnosis not present

## 2014-11-09 MED ORDER — UMECLIDINIUM BROMIDE 62.5 MCG/INH IN AEPB: 1.0000 | INHALATION_SPRAY | Freq: Every day | RESPIRATORY_TRACT | Status: DC

## 2014-11-09 MED ORDER — TIOTROPIUM BROMIDE MONOHYDRATE 18 MCG IN CAPS: 18.0000 ug | ORAL_CAPSULE | Freq: Every day | RESPIRATORY_TRACT | Status: DC

## 2014-11-09 NOTE — Telephone Encounter (Signed)
As this patient was seen by me on 10/21/14 and deemed necessary for their treatment as documented in the EHR, I will refill this prescription for Spiriva along with incruse as her insurance was going to drop Spiriva off of its coverage. To ensure that she is adequately covered, I will refill both medications and note to the pharmacy to refill the latter only if the former is no longer covered her insurance.

## 2014-11-09 NOTE — Addendum Note (Signed)
Addended by: Riccardo Dubin on: 11/09/2014 05:09 PM   Modules accepted: Orders

## 2014-11-09 NOTE — Telephone Encounter (Signed)
Called pt and aware of recs. appt scheduled for Friday. Pt to bring formulary

## 2014-11-09 NOTE — Telephone Encounter (Signed)
Called and spoke with pt and she stated that her insurance will not cover the spiriva.  MW please advise on alternatives.  Pt stated that she is now out of the spiriva.   Thanks   Allergies  Allergen Reactions  . Aspirin Other (See Comments)    Pt does not remember reaction    Current Outpatient Prescriptions on File Prior to Visit  Medication Sig Dispense Refill  . albuterol (PROVENTIL HFA;VENTOLIN HFA) 108 (90 BASE) MCG/ACT inhaler Inhale 2 puffs into the lungs every 6 (six) hours as needed for wheezing. 3 Inhaler 12  . albuterol (PROVENTIL) (2.5 MG/3ML) 0.083% nebulizer solution Take 2.5 mg by nebulization every 6 (six) hours as needed for wheezing. For wheezing    . aspirin EC 81 MG tablet Take 1 tablet (81 mg total) by mouth daily. 150 tablet 2  . budesonide-formoterol (SYMBICORT) 160-4.5 MCG/ACT inhaler Inhale 2 puffs into the lungs 2 (two) times daily. 10.2 g 6  . famotidine (PEPCID) 10 MG tablet Take 10 mg by mouth at bedtime.    . gabapentin (NEURONTIN) 400 MG capsule Take 1 capsule (400 mg total) by mouth 3 (three) times daily. 90 capsule 6  . ibuprofen (ADVIL,MOTRIN) 800 MG tablet Take 1 tablet (800 mg total) by mouth every 8 (eight) hours as needed. 60 tablet 0  . lovastatin (MEVACOR) 20 MG tablet Take 1 tablet (20 mg total) by mouth at bedtime. 30 tablet 0  . omeprazole (PRILOSEC) 20 MG capsule Take 1 capsule (20 mg total) by mouth 2 (two) times daily. 180 capsule 3  . tiotropium (SPIRIVA) 18 MCG inhalation capsule Place 1 capsule (18 mcg total) into inhaler and inhale daily. 30 capsule 6  . traMADol (ULTRAM) 50 MG tablet Take 1 tablet (50 mg total) by mouth every 12 (twelve) hours as needed. 60 tablet 0   No current facility-administered medications on file prior to visit.

## 2014-11-09 NOTE — Telephone Encounter (Signed)
Pt returning call.Mary Jenkins ° °

## 2014-11-09 NOTE — Telephone Encounter (Signed)
Takes about a week to wear off so needs ov with formulary and before the benefit runs out and we'll pick an alternative

## 2014-11-09 NOTE — Telephone Encounter (Signed)
lmtcb for pt.  

## 2014-11-11 ENCOUNTER — Ambulatory Visit (INDEPENDENT_AMBULATORY_CARE_PROVIDER_SITE_OTHER): Payer: Medicare Other | Admitting: Internal Medicine

## 2014-11-11 ENCOUNTER — Encounter: Payer: Self-pay | Admitting: Internal Medicine

## 2014-11-11 VITALS — BP 112/70 | HR 84 | Ht 65.5 in | Wt 131.6 lb

## 2014-11-11 DIAGNOSIS — J449 Chronic obstructive pulmonary disease, unspecified: Secondary | ICD-10-CM | POA: Diagnosis not present

## 2014-11-11 MED ORDER — UMECLIDINIUM BROMIDE 62.5 MCG/INH IN AEPB
1.0000 | INHALATION_SPRAY | Freq: Every day | RESPIRATORY_TRACT | Status: DC
Start: 2014-11-11 — End: 2014-11-15

## 2014-11-11 NOTE — Progress Notes (Addendum)
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 No real variability, cough is better with no excess or purulent sputum.  Not using saba daytime because finds if she's holding still doesn't need it as oftern. Some better p last  Prednisone rx. Not much variability. rec Stop atrovent and advair Start symbiocort Take 2 puffs first thing in am and then another 2 puffs about 12 hours later.  Take after only am dose Spiriva Protonix 40 mg Take 30-60 min before first meal of the day  GERD diet Only use your albuterol (Plan B= ventolin puffer,  Plan C is nebulizer) as a rescue medication to be used if you can't catch your breath by resting or doing a relaxed purse lip breathing pattern. The less you use it, the better it will work when you need it. Ok to use rescue up to every 4 hours if doing poorly.    Phone call 06/06/14  Reports she feels "like I'm catching a cold."  C/o Increased DOE and wheezing.  Sxs started yesterday.   No chest tightness or cough. Has tried albuterol hfa and is taking maintanence meds.> ov    06/06/2014 f/u ov/Munirah Doerner quit smoking 06/2012 re: GOLD IV copd  Chief Complaint  Patient presents with  . Acute Visit    Pt c/o increased DOE x 2 days- SOB with walking from room to room at home. She also has noticed wheezing off and on for the past few days. Not using rescue inhaler but did use neb 1 day ago.   onset of   "cold" symptoms 06/04/14= scratchy throat nasal congestion  baseline no need for the saba hfa or neb/ maint on  spira/symbicort and acid rx Also new R leg pain behind knee  x one week, no swelling no h/o injury or prior dvt rec Only use your albuterol inhaler as a rescue medication  Only use the nebulizer if not better after inhaler, ok to use up to every 4 hours if needed   11/11/2014 f/u ov/Letoya Stallone re: GOLD IV copd maint rx symbicort  160 2bid and spiriva  Chief Complaint  Patient presents with  . Follow-up    Pt here to discuss alternative to Spiriva since her insurance does not cover. She did not bring her formulary. She ran out of spiriva 6 days ago. She c/o increased SOB "for a while" and is using her rescue inhaler 5 x per day on average.   doe x getting dressed/ shower / across parking lot  avg use of saba hfa maybe once a day before ran out of spiriva  now up to 4 x daily     No obvious daytime variabilty or assoc chronic cough or cp or chest tightness, or overt   hb symptoms. No unusual exp hx or h/o childhood pna/ asthma or premature birth to his knowledge.     Sleeping ok without nocturnal  or early am exacerbation  of respiratory  c/o's or need for noct saba. Also denies  any obvious fluctuation of symptoms with weather or environmental changes or other aggravating or alleviating factors except as outlined above.  Current Medications, Allergies, Past Medical History, Past Surgical History, Family History, and Social History were reviewed in [employer] record.  ROS  The following are not active complaints unless bolded sore throat, dysphagia, dental problems, itching, sneezing,  nasal congestion or excess/ purulent secretions, ear ache,   fever, chills, sweats, unintended wt loss, pleuritic or exertional cp, hemoptysis,  orthopnea pnd or leg swelling, presyncope, palpitations, heartburn, abdominal pain, anorexia, nausea, vomiting, diarrhea  or change in bowel or urinary habits, change in stools or urine, dysuria,hematuria,  rash, arthralgias, visual complaints, headache,  numbness weakness or ataxia or problems with walking or coordination,  change in mood/affect or memory.             Objective:   Physical Exam  amb bf nad    Wt 120 02/13/2012  > 119 03/13/2012 > 04/22/2012 117 > 07/21/2012 122> 12/02/2012  131> 03/22/2013  131 > 04/19/2013  129  > 07/19/2013 131 > 06/06/2014 129 >  11/11/2014  132   HEENT mild turbinate edema.  Oropharynx edentulous no excess pnd or cobblestoning. Mild thrush under lower dentures.  No JVD or cervical adenopathy. Mild accessory muscle hypertrophy. Trachea midline, nl thryroid. Chest was hyperinflated by percussion with diminished breath sounds and moderate increased exp time without wheeze. Hoover sign positive at mid inspiration. Regular rate and rhythm without murmur gallop or rub or increase P2 or edema.  Abd: no hsm, nl excursion. Ext warm without cyanosis or clubbing.             Assessment & Plan:   Outpatient Encounter Prescriptions as of 11/11/2014  Medication Sig  . albuterol (PROVENTIL HFA;VENTOLIN HFA) 108 (90 BASE) MCG/ACT inhaler Inhale 2 puffs into the lungs every 6 (six) hours as needed for wheezing.  Marland Kitchen albuterol (PROVENTIL) (2.5 MG/3ML) 0.083% nebulizer solution Take 2.5 mg by nebulization every 6 (six) hours as needed for wheezing. For wheezing  . budesonide-formoterol (SYMBICORT) 160-4.5 MCG/ACT inhaler Inhale 2 puffs into the lungs 2 (two) times daily.  . famotidine (PEPCID) 10 MG tablet Take 10 mg by mouth at bedtime.  . gabapentin (NEURONTIN) 400 MG capsule Take 1 capsule (400 mg total) by mouth 3 (three) times daily.  Marland Kitchen ibuprofen (ADVIL,MOTRIN) 800 MG tablet Take 1 tablet (800 mg total) by mouth every 8 (eight) hours as needed.  . lovastatin (MEVACOR) 20 MG tablet Take 1 tablet (20 mg total) by mouth at bedtime.  Marland Kitchen omeprazole (PRILOSEC) 20 MG capsule Take 1 capsule (20 mg total) by mouth 2 (two) times daily.  . traMADol (ULTRAM) 50 MG tablet Take 1 tablet (50 mg total) by mouth every 12 (twelve) hours  as needed.  Marland Kitchen aspirin EC 81 MG tablet Take 1 tablet (81 mg total) by mouth daily. (Patient not taking: Reported on 11/11/2014)  . tiotropium (SPIRIVA) 18 MCG inhalation capsule Place 1 capsule (18 mcg total) into inhaler and inhale daily. (Patient not taking: Reported on 11/11/2014)  . Umeclidinium Bromide (INCRUSE ELLIPTA) 62.5 MCG/INH AEPB Inhale 1 puff into the lungs daily.  . [DISCONTINUED] Umeclidinium Bromide (INCRUSE ELLIPTA) 62.5 MCG/INH AEPB Inhale 1 puff into the lungs daily. (Patient not taking: Reported on 11/11/2014)

## 2014-11-11 NOTE — Patient Instructions (Addendum)
incruse 2 pffs off of one click after symbicort am dose only   If you are satisfied with your treatment plan,  let your doctor know and he/she can either refill your medications or you can return here when your prescription runs out.     If in any way you are not 100% satisfied,  please tell us.  If 100% better, tell your friends!  Pulmonary follow up is as needed

## 2014-11-12 ENCOUNTER — Encounter: Payer: Self-pay | Admitting: Internal Medicine

## 2014-11-12 NOTE — Assessment & Plan Note (Signed)
PFT performed on 06/27/2011. Poor quality.    - FEV1 0.39 ( 16%) ratio 23 and 16% better p B2    - 03/13/2012  Inhaler technique 90% with dpi but only 50% with mdi > 50% 04/22/12   - Alpha one genotype 02/13/12 > MM   - 03/22/2013  PFT's 0.56 ( 24%) ratio 33 but 12% better improvement and DLCO 21 corrects to 28%  -  hfa 90% 04/19/2013 p extensive coaching  - started Incruse 11/11/14 p spiriva off formulary  I had an extended discussion with the patient reviewing all relevant studies completed to date and  lasting 15 to 20 minutes of a 25 minute visit on the following ongoing concerns:  1) formulary restrictions reviewed in detail and informed here we could always give her samples if this happens again, and it probably will, rather than have her go s her meds due to insurance restrictions on her meds  2) The proper method of use, as well as anticipated side effects, of a dry powder inhaler are discussed and demonstrated to the patient. Improved effectiveness after extensive coaching during this visit to a level of approximately  90%   3)  Each maintenance medication was reviewed in detail including most importantly the difference between maintenance and as needed and under what circumstances the prns are to be used.  Please see instructions for details which were reviewed in writing and the patient given a copy.

## 2014-11-15 ENCOUNTER — Other Ambulatory Visit: Payer: Self-pay | Admitting: Internal Medicine

## 2014-11-15 ENCOUNTER — Ambulatory Visit (INDEPENDENT_AMBULATORY_CARE_PROVIDER_SITE_OTHER): Payer: Medicare Other | Admitting: Internal Medicine

## 2014-11-15 ENCOUNTER — Encounter: Payer: Self-pay | Admitting: Internal Medicine

## 2014-11-15 VITALS — BP 109/76 | HR 83 | Temp 98.1°F | Ht 65.5 in | Wt 131.3 lb

## 2014-11-15 DIAGNOSIS — Z1231 Encounter for screening mammogram for malignant neoplasm of breast: Secondary | ICD-10-CM

## 2014-11-15 DIAGNOSIS — L989 Disorder of the skin and subcutaneous tissue, unspecified: Secondary | ICD-10-CM

## 2014-11-15 DIAGNOSIS — J449 Chronic obstructive pulmonary disease, unspecified: Secondary | ICD-10-CM | POA: Diagnosis not present

## 2014-11-15 DIAGNOSIS — M79604 Pain in right leg: Secondary | ICD-10-CM

## 2014-11-15 MED ORDER — UMECLIDINIUM BROMIDE 62.5 MCG/INH IN AEPB: 1.0000 | INHALATION_SPRAY | Freq: Every day | RESPIRATORY_TRACT | Status: DC

## 2014-11-15 NOTE — Assessment & Plan Note (Signed)
Overview -She reports having pain in her right thigh the last week and is concerned it might be related to statin that she was started on at her last visit -Denies any association with exertion and reports that pain is intermittent, throbbing, and occurs sporadically throughout the day -Ibuprofen alleviates her pain -Has recently been unable to go outside walking given how cold the weather his  Assessment -Muscle spasms likely given that she has been recently inactive -Statin-induced myopathy is very unlikely given that her symptoms are unilateral and not symmetric along with onset several weeks after she has been on medication  Plan -Advised her to try stretching and continue taking ibuprofen as needed or applying heat pads -Offered her Flexeril though she is not interested in taking that medication

## 2014-11-15 NOTE — Patient Instructions (Signed)
Thank you for bringing your medicines today. This helps [place] keep you safe from mistakes.  Please try stretching and ibuprofen for your right muscle pain.   For the skin itching, try applying lotion to the area.   If things don't get better in a month, please come back in March.   Keep up the good work!

## 2014-11-15 NOTE — Progress Notes (Signed)
   Subjective:    Patient ID: Mary Jenkins, female    DOB: 04/18/1956, 59 y.o.   MRN: [id number]  HPI Mary Jenkins is a 59 year old female with stage IV COPD,cervical radiculopathy, GERD, R breast cancer s/p mastectomy (1997), chemo, and corrective reconstruction who presents today for follow-up visit. Please see assessment & plan for documentation of each problem.   Review of Systems  Respiratory: Positive for shortness of breath (Resolved).   Cardiovascular: Negative for chest pain and leg swelling.  Gastrointestinal: Negative for nausea, vomiting, abdominal pain, diarrhea and blood in stool.  Musculoskeletal:       Right gluteal pain  Neurological: Negative for dizziness.       Objective:   Physical Exam Constitutional: She is oriented to person, place, and time. Pleasant African-American female. No distress.  HENT:  Head: Normocephalic and atraumatic.  Eyes: Conjunctivae are normal. Cardiovascular: Normal rate, regular rhythm and normal heart sounds.  Exam reveals no gallop and no friction rub.   No murmur heard. Pulmonary/Chest: Poor airflow bilaterally though effort normal. Back: Melanocytic nevus and seborrheic keratosis noted at the area which she complains of just underneath her left bra strap. Hip: Flexion and extension are not limited by pain bilaterally. Internal and external rotation bilaterally are not limited by pain pain on deep palpation of right gluteal muscle.  Neurological: She is alert and oriented to person, place, and time. Coordination normal.  Skin: Skin is warm and dry. She is not diaphoretic.  Psychiatric: Her behavior is normal.         Assessment & Plan:

## 2014-11-15 NOTE — Assessment & Plan Note (Addendum)
Overview -Reports significant distress from itching that she's suffers from that is located along her left back. Feels that she had reported this at an earlier visit with another provider though it was not fully assessed at that time. -Has not applied lotion to that area she cannot reach her  Assessment -Physical exam findings consistent with melanocytic nevus that does not have features suspicious for malignant lesion along with seborrheic keratosis  Plan -Advised her to apply lotion to this area or procure the assistance of another who can help her if she cannot reach the area by herself -Offered her the option of potentially biopsying it should it continuously irritate her skin as it contacts with her bra strap though she tells me that she often does not wear a bra

## 2014-11-15 NOTE — Assessment & Plan Note (Signed)
She reports seeing her pulmonologist as her insurance would no longer cover Spiriva, and she was instructed to start a new medication called Incruse. Her pulmonologist and explained to her that she needs to make sure that she took 2 puffs per capsule to ensure that there was adequate medication to control and alleviate her symptoms. I have called in for sufficient refills, and she acknowledges understanding.

## 2014-11-15 NOTE — Assessment & Plan Note (Signed)
She received a phone call to schedule her mammogram but declined per the instructions of her reconstructive surgeon, Dr. Towanda Malkin, that she should defer mammogram until her procedure is complete. She is due to follow-up with him in April and will ask again at that time.

## 2014-11-16 NOTE — Progress Notes (Signed)
Internal Medicine Clinic Attending  Case discussed with Dr. Posey Pronto at the time of the visit.  We reviewed the resident's history and exam and pertinent patient test results.  I agree with the assessment, diagnosis, and plan of care documented in the resident's note.

## 2014-11-18 ENCOUNTER — Encounter: Payer: Self-pay | Admitting: Cardiovascular Disease

## 2014-11-18 ENCOUNTER — Ambulatory Visit (INDEPENDENT_AMBULATORY_CARE_PROVIDER_SITE_OTHER): Payer: Medicare Other | Admitting: Cardiovascular Disease

## 2014-11-18 VITALS — BP 100/60 | HR 86 | Ht 65.5 in | Wt 126.0 lb

## 2014-11-18 DIAGNOSIS — R0789 Other chest pain: Secondary | ICD-10-CM | POA: Diagnosis not present

## 2014-11-18 NOTE — Patient Instructions (Signed)
Your physician recommends that you continue on your current medications as directed. Please refer to the Current Medication list given to you today.  Your physician recommends that you schedule a follow-up appointment in: as needed with Dr. Nahser  

## 2014-11-18 NOTE — Progress Notes (Signed)
Cardiology Office Note   Date:  11/18/2014   ID:  Mary Jenkins, DOB 06-Oct-1955, MRN [id number]  PCP:  Charlott Rakes, MD  Cardiologist:   Thayer Headings, MD   Chief Complaint  Patient presents with  . Chest Pain    Problem list: 1. Chest pain 2. COPD 3. Hyperlipidemia      History of Present Illness: Mary Jenkins is a 59 y.o. female who presents for chest discomfort.  Pains are intermittant.  Occurs at rest and exertion.  Last for a few seconds.  Not associated with sweats or worsening shortness of breast. Sharp, very brief,   Has been going on for a few months.  Was worried because her brothers have heart disease.   Does not get any regular exercise,  Walks in the warmer month.   Breast cancer in 1997.   Stopped smoking     Past Medical History  Diagnosis Date  . Personal history of chemotherapy 1997  . Insomnia 06/24/2011  . GERD (gastroesophageal reflux disease) 02/03/2012  . 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  Separate hypoattenuating lesions within the liver, favored to represent small cysts or complex cyst. Too small to entirely characterize.   CT abdomen/pelvis 11/2011. 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 2. Also unchanged is a 7 mm low attenuation lesion in segment 8 of  the liver, which is too small to characterize, but is favored to represent a small cyst or biliary hamartoma. 3. Small amount of pneumobilia in the left lobe of the liver, as above, new compared to the prior examination 06/14/2011. This is of uncertain etiology and significance, but is a common finding in patients who have had prior sphincterotomy (clinical correlation is recommended).     . COPD (chronic  obstructive pulmonary disease) 06/24/2011  . Emphysema   . Exertional shortness of breath   . DDD (degenerative disc disease), cervical   . Wears glasses   . Full dentures   . Breast cancer 1997    chemo-no PAC  . Anemia     Past Surgical History  Procedure Laterality Date  . Tubal ligation  1980's  . Orif wrist fracture      lt-age 59  . Colonoscopy    . Breast biopsy Left 1997  . Breast surgery  97    implant  . Mastectomy, partial Right 1997    -node dissection  . Breast capsulotomy with implant exchange Right 06/21/2013    Procedure: REVISION RIGHT BREAST RECONSTRUCTION/REMOVAL OF RIGHT IMPLANT/RIGHT BREAST CAPSULOTOMY WITH INSERT TISSUE EXPLANDER RIGHT BREAST/POSSIBLE LEFT BREAST MASTOPEXY;  Surgeon: Cristine Polio, MD;  Location: [place];  Service: Plastics;  Laterality: Right;  . Breast reconstruction Right 06/21/2013    Procedure: BREAST RECONSTRUCTION;  Surgeon: Cristine Polio, MD;  Location: [place];  Service: Plastics;  Laterality: Right;  . Breast reconstruction Left 11/29/2013    Procedure: LEFT MASTOPEXY FOR RECONSTRUCTION;  Surgeon: Cristine Polio, MD;  Location: [facility];  Service: Plastics;  Laterality: Left;  . Breast reconstruction Right 03/14/2014    Procedure: RECONSTRUCTION NIPPLE RIGHT BREAST;  Surgeon: Cristine Polio, MD;  Location: [place];  Service: Plastics;  Laterality: Right;  . Tissue expander placement Right 03/14/2014    Procedure: SALINE REMOVAL RIGHT TISSUE EXPANDER;  Surgeon: Cristine Polio, MD;  Location: [place];  Service: Plastics;  Laterality: Right;     Current Outpatient Prescriptions  Medication Sig Dispense Refill  . albuterol (PROVENTIL HFA;VENTOLIN HFA) 108 (90 BASE) MCG/ACT inhaler Inhale 2 puffs into the lungs every 6 (six) hours as needed for wheezing. 3 Inhaler 12  . albuterol (PROVENTIL) (2.5 MG/3ML) 0.083% nebulizer solution Take 2.5 mg  by nebulization every 6 (six) hours as needed for wheezing. For wheezing    . aspirin EC 81 MG tablet Take 1 tablet (81 mg total) by mouth daily. 150 tablet 2  . budesonide-formoterol (SYMBICORT) 160-4.5 MCG/ACT inhaler Inhale 2 puffs into the lungs 2 (two) times daily. 10.2 g 6  . famotidine (PEPCID) 10 MG tablet Take 10 mg by mouth at bedtime.    . gabapentin (NEURONTIN) 400 MG capsule Take 1 capsule (400 mg total) by mouth 3 (three) times daily. 90 capsule 6  . ibuprofen (ADVIL,MOTRIN) 800 MG tablet Take 1 tablet (800 mg total) by mouth every 8 (eight) hours as needed. 60 tablet 0  . lovastatin (MEVACOR) 20 MG tablet Take 1 tablet (20 mg total) by mouth at bedtime. 30 tablet 0  . RESTASIS 0.05 % ophthalmic emulsion Place into both eyes 2 (two) times daily.    . traMADol (ULTRAM) 50 MG tablet Take 1 tablet (50 mg total) by mouth every 12 (twelve) hours as needed. 60 tablet 0  . Umeclidinium Bromide (INCRUSE ELLIPTA) 62.5 MCG/INH AEPB Inhale 1 puff into the lungs daily. 30 each 6   No current facility-administered medications for this visit.    Allergies:   Aspirin    Social History:  The patient  reports that she quit smoking about 2 years ago. Her smoking use included Cigarettes. She has a 205 pack-year smoking history. She has never used smokeless tobacco. She reports that she does not drink alcohol or use illicit drugs.   Family History:  The patient's family history includes Heart disease in her brother, brother, and brother; Hypertension in her mother and sister. There is no history of Colon cancer, Esophageal cancer, or Stomach cancer.    ROS:  Please see the history of present illness.    Review of Systems: Constitutional:  denies fever, chills, diaphoresis, appetite change and fatigue.  HEENT: denies photophobia, eye pain, redness, hearing loss, ear pain, congestion, sore throat, rhinorrhea, sneezing, neck pain, neck stiffness and tinnitus.  Respiratory: denies SOB, DOE, cough,  chest tightness, and wheezing.  Cardiovascular: admits to chest pain,   Gastrointestinal: denies nausea, vomiting, abdominal pain, diarrhea, constipation, blood in stool.  Genitourinary: denies dysuria, urgency, frequency, hematuria, flank pain and difficulty urinating.  Musculoskeletal: denies  myalgias, back pain, joint swelling, arthralgias and gait problem.   Skin: denies pallor, rash and wound.  Neurological: denies dizziness, seizures, syncope, weakness, light-headedness, numbness and headaches.   Hematological: denies adenopathy, easy bruising, personal or family bleeding history.  Psychiatric/ Behavioral: denies suicidal ideation, mood changes, confusion, nervousness, sleep disturbance and agitation.       All other systems are reviewed and negative.    PHYSICAL EXAM: VS:  BP 100/60 mmHg  Pulse 86  Ht 5' 5.5" (1.664 m)  Wt 126 lb (57.153 kg)  BMI 20.64 kg/m2  SpO2 88% , BMI Body mass index is 20.64 kg/(m^2). GEN: Well nourished, well developed, in no acute distress HEENT: normal Neck: no JVD,  carotid bruits, or masses Cardiac: RRR; no murmurs, rubs, or gallops,no edema  Respiratory:  clear to auscultation bilaterally, normal work of breathing GI: soft, nontender, nondistended, + BS MS: no deformity or atrophy Skin: warm and dry, no rash Neuro:  Strength and sensation are intact Psych: normal   EKG:  EKG is not ordered today.    Recent Labs: 03/07/2014: ALT 9; Hemoglobin 13.3; Platelets 313 03/10/2014: BUN 14; Creatinine 0.96; Potassium 4.5; Sodium 140    Lipid Panel    Component Value Date/Time   CHOL 233* 04/22/2014 0300   TRIG 67 04/22/2014 0300   HDL 88 04/22/2014 0300   CHOLHDL 2.6 04/22/2014 0300   VLDL 13 04/22/2014 0300   LDLCALC 132* 04/22/2014 0300      Wt Readings from Last 3 Encounters:  11/18/14 126 lb (57.153 kg)  11/15/14 131 lb 4.8 oz (59.557 kg)  11/11/14 131 lb 9.6 oz (59.693 kg)      Other studies Reviewed: Additional studies/  records that were reviewed today include: records from int. Med. clinic. Review of the above records demonstrates: atypical CP    ASSESSMENT AND PLAN:  1. Atypical chest pain: Mary Jenkins presents today with symptoms of very atypical chest pain. These are fleeting chest pains that last for only one second. They're not associated with any eating or drinking. She does not have any associated shortness of breath or sweats.  These pains are Atypical and do not need any further evaluation. I've recommended that she exercise on a regular basis.   Current medicines are reviewed at length with the patient today.  The patient does not have concerns regarding medicines.  The following changes have been made:  no change   Disposition:   FU with your medical doctor     Signed, Nahser, Wonda Cheng, MD  11/18/2014 10:56 AM    [address], [place], [place]  [address] Phone: [phone]; Fax: [phone]

## 2014-11-23 ENCOUNTER — Encounter: Payer: Self-pay | Admitting: Internal Medicine

## 2014-11-23 NOTE — Progress Notes (Signed)
Patient ID: Mary Jenkins, female   DOB: 27-Aug-1956, 59 y.o.   MRN: [id number]  I have reviewed the office notes from Dr. [place] Bing  for the following office visits:  06/01/14: Conservative therapy with steroids x 2 weeks for her cervical radiculopathy -MRI scans showed spondylosis at multiple levels thought was difficult to ascertain foraminal anatomy  06/13/14: Resolution in pain with stable neurologic findings.  -Follow-up as needed

## 2014-12-06 ENCOUNTER — Telehealth: Payer: Self-pay | Admitting: *Deleted

## 2014-12-06 NOTE — Telephone Encounter (Signed)
Agree with appt Thanks 

## 2014-12-06 NOTE — Telephone Encounter (Signed)
Pt called stating she had a bruise on arm, her daughter noted it yesterday so she is not sure where it came from .Marland Kitchen SNo known injury. Bruise on left arm above elbow, size of quarter, black in color. No sore to touch.  No blood thinners.  Pt also c/o hip pain so I scheduled her for an clinic visit tomorrow morning.  Pt # [phone]

## 2014-12-07 ENCOUNTER — Encounter: Payer: Self-pay | Admitting: Internal Medicine

## 2014-12-07 ENCOUNTER — Ambulatory Visit (INDEPENDENT_AMBULATORY_CARE_PROVIDER_SITE_OTHER): Payer: Commercial Managed Care - HMO | Admitting: Internal Medicine

## 2014-12-07 VITALS — BP 110/59 | HR 75 | Temp 97.9°F | Ht 65.5 in | Wt 128.1 lb

## 2014-12-07 DIAGNOSIS — T148XXA Other injury of unspecified body region, initial encounter: Secondary | ICD-10-CM

## 2014-12-07 DIAGNOSIS — T148 Other injury of unspecified body region: Secondary | ICD-10-CM | POA: Diagnosis not present

## 2014-12-07 DIAGNOSIS — X58XXXA Exposure to other specified factors, initial encounter: Secondary | ICD-10-CM

## 2014-12-07 LAB — BASIC METABOLIC PANEL WITH GFR
BUN: 12 mg/dL (ref 6–23)
CHLORIDE: 107 meq/L (ref 96–112)
CO2: 27 mEq/L (ref 19–32)
CREATININE: 0.88 mg/dL (ref 0.50–1.10)
Calcium: 9.3 mg/dL (ref 8.4–10.5)
GFR, EST AFRICAN AMERICAN: 84 mL/min
GFR, EST NON AFRICAN AMERICAN: 73 mL/min
GLUCOSE: 78 mg/dL (ref 70–99)
POTASSIUM: 5.2 meq/L (ref 3.5–5.3)
Sodium: 144 mEq/L (ref 135–145)

## 2014-12-07 LAB — CBC WITH DIFFERENTIAL/PLATELET
BASOS ABS: 0.1 10*3/uL (ref 0.0–0.1)
BASOS PCT: 1 % (ref 0–1)
EOS PCT: 2 % (ref 0–5)
Eosinophils Absolute: 0.1 10*3/uL (ref 0.0–0.7)
HCT: 41.1 % (ref 36.0–46.0)
HEMOGLOBIN: 13 g/dL (ref 12.0–15.0)
Lymphocytes Relative: 28 % (ref 12–46)
Lymphs Abs: 1.8 10*3/uL (ref 0.7–4.0)
MCH: 25.5 pg — ABNORMAL LOW (ref 26.0–34.0)
MCHC: 31.6 g/dL (ref 30.0–36.0)
MCV: 80.7 fL (ref 78.0–100.0)
MONOS PCT: 8 % (ref 3–12)
MPV: 10.3 fL (ref 8.6–12.4)
Monocytes Absolute: 0.5 10*3/uL (ref 0.1–1.0)
Neutro Abs: 4 10*3/uL (ref 1.7–7.7)
Neutrophils Relative %: 61 % (ref 43–77)
Platelets: 298 10*3/uL (ref 150–400)
RBC: 5.09 MIL/uL (ref 3.87–5.11)
RDW: 17.2 % — ABNORMAL HIGH (ref 11.5–15.5)
WBC: 6.5 10*3/uL (ref 4.0–10.5)

## 2014-12-07 LAB — PROTIME-INR
INR: 0.96 (ref 0.00–1.49)
Prothrombin Time: 12.9 seconds (ref 11.6–15.2)

## 2014-12-07 LAB — APTT: aPTT: 31 seconds (ref 24–37)

## 2014-12-07 NOTE — Patient Instructions (Signed)
It was a pleasure to see you today. We will get blood work today and call you if there are any issues. Try not to take the ibuprofen if you do not have to. Please return to clinic or seek medical attention if you have any new or worsening bruising, bleeding, or other worrisome medical condition. We look forward to seeing you again as needed.  Lottie Mussel, MD  General Instructions:   Please try to bring all your medicines next time. This will help [place] keep you safe from mistakes.   Progress Toward Treatment Goals:  No flowsheet data found.  Self Care Goals & Plans:  No flowsheet data found.  No flowsheet data found.   Care Management & Community Referrals:  No flowsheet data found.

## 2014-12-07 NOTE — Assessment & Plan Note (Addendum)
Mary Jenkins appears to have a bruise on her L arm. She does not remember trauma and denies any abuse. She had normal CBC when last checked 02/2014 with platelet 313k. No prior PT/INR or PTT. No known history of hematologic abnormalities. She did have remote history of breast cancer in remission after mastectomy and 6 months chemotherapy in 1997. She does not think it was advanced stage or that she took tamoxifen but there is still concern if it relapsed to have infiltrated bone marrow. She just started ASA 81 mg daily so this is likely not cause. She is on inhaled steroid symbicort 2 puff bid which could make her susceptible to easy bruising (even if she does not remember specific trauma), but because she says she uses her albuterol often we will not scale back on the symbicort. She did note she takes ibuprofen for known cervical radiculopathy and arm pain and thinks she has been taking more recently so we encourage her to not take if -BMP, CBC, PT/INR, PTT -continue ASA 81 mg daily, symbicort 2 puff bid -encourage cessation of ibuprofen   ADDENDUM: BMP, CBC wnl (platelet 298). PT 12.9, INR 0.96. I called Mary Jenkins and told her the results were normal. She had no questions.  Lottie Mussel, MD 12/08/14, 10:13 am

## 2014-12-07 NOTE — Addendum Note (Signed)
Addended by: Orson Gear on: 12/07/2014 11:41 AM   Modules accepted: Orders

## 2014-12-07 NOTE — Progress Notes (Signed)
Subjective:    Patient ID: Mary Jenkins, female    DOB: 07/17/1956, 59 y.o.   MRN: [id number]  HPI  Mary Jenkins is a 59 year old woman with COPD, history of breast cancer s/p mastectomy and chemo 1997 here because of a newly noticed bruise. She was in her usual state of health until Monday 3/14. Her daughter noticed a dark lesion that appears to be a bruise on her L arm. It is not tender to touch and no pain in any part of her L arm, shoulder, hand, etc. It has not changed in size or appearance since first noted two days ago. She denies any trauma, accident or injury. She lives alone but says she feels safe. She has never had issues with bruising before. No obvious source of bleeding elsewhere. Only family history of blood issues is a sister with DVT. She just started taking ASA 81 mg daily the same day as she noticed this lesion. No other new medications, detergents, lotions. She does note she has taken more ibuprofen than usual for her R arm pain from known radiculopathy. She also says she uses her albuterol inhaler a little more often than usual.  Review of Systems  Constitutional: Negative for fever, chills and diaphoresis.  HENT: Negative for nosebleeds.   Respiratory: Negative for shortness of breath.   Cardiovascular: Negative for chest pain.  Gastrointestinal: Negative for nausea, vomiting, abdominal pain, diarrhea, constipation and blood in stool.  Musculoskeletal: Positive for arthralgias.  Skin: Positive for color change.  Hematological: Bruises/bleeds easily.       Objective:   Physical Exam  Constitutional: She is oriented to person, place, and time. She appears well-developed and well-nourished. No distress.  HENT:  Head: Normocephalic and atraumatic.  Mouth/Throat: Oropharynx is clear and moist.  Eyes: EOM are normal. Pupils are equal, round, and reactive to light.  Cardiovascular: Normal rate, regular rhythm, normal heart sounds and intact distal pulses.  Exam reveals no  gallop and no friction rub.   No murmur heard. Pulmonary/Chest: Effort normal and breath sounds normal. No respiratory distress. She has no wheezes.  Abdominal: Soft. Bowel sounds are normal. She exhibits no distension. There is no tenderness.  Musculoskeletal:  ~ 3 cm x 3 cm dark brown lesion on L arm, non-tender, non-blanching, well defined margins, not raised, appears to be bruise  Neurological: She is alert and oriented to person, place, and time.  Skin: She is not diaphoretic.  Vitals reviewed.         Assessment & Plan:

## 2014-12-07 NOTE — Addendum Note (Signed)
Addended by: Orson Gear on: 12/07/2014 11:44 AM   Modules accepted: Orders

## 2014-12-08 NOTE — Progress Notes (Signed)
I saw and evaluated the patient. I personally confirmed the key portions of Dr. Bebe Shaggy history and exam and reviewed pertinent patient test results. The assessment, diagnosis, and plan were formulated together and I agree with the documentation in the resident's note.

## 2014-12-12 ENCOUNTER — Telehealth: Payer: Self-pay | Admitting: *Deleted

## 2014-12-12 NOTE — Telephone Encounter (Signed)
New bruise right lower leg - found 12/11/14. Appt made 12/18/13 8:45AM Dr Ethelene Hal. Instructed for pt to check with clinic if any cancel appt. If bruising gets worse - to call clinic. Hilda Blades Benisha Hadaway RN 12/12/14 3PM

## 2014-12-19 ENCOUNTER — Encounter: Payer: Self-pay | Admitting: Internal Medicine

## 2014-12-19 ENCOUNTER — Ambulatory Visit (INDEPENDENT_AMBULATORY_CARE_PROVIDER_SITE_OTHER): Payer: Commercial Managed Care - HMO | Admitting: Internal Medicine

## 2014-12-19 VITALS — BP 124/59 | HR 63 | Temp 98.0°F | Ht 65.0 in | Wt 127.2 lb

## 2014-12-19 DIAGNOSIS — X58XXXA Exposure to other specified factors, initial encounter: Secondary | ICD-10-CM

## 2014-12-19 DIAGNOSIS — S8011XA Contusion of right lower leg, initial encounter: Secondary | ICD-10-CM | POA: Diagnosis not present

## 2014-12-19 DIAGNOSIS — T148XXA Other injury of unspecified body region, initial encounter: Secondary | ICD-10-CM

## 2014-12-19 NOTE — Progress Notes (Signed)
   Subjective:    Patient ID: Mary Jenkins, female    DOB: [birth year], 59 y.o.   MRN: [id number]  HPI  Ms Whittier is a 59 year old woman with COPD, history of breast cancer s/p mastectomy and chemotherapy 1997 here for new bruise. She was recently seen in [facility] 3/16 for an unexplained painless bruise on her L arm. She denied any trauma, hematologic history. She had just started taking ASA 81 mg daily the day she noticed the lesion. She also takes ibuprofen for known radiculopathy. Work-up at that time noted normal BMP, CBC (platelet 298), PT (12.9), INR (0.96), PTT (31).  Today, she comes for a new bruise on her R lateral leg for the past week. It has nearly gone away by today. It does not hurt. She again denies any trauma. She feels safe at home (lives alone).The previous bruise on her L arm resolved by the middle of last week. She has stopped taking the ibuprofen. She is still taking the daily ASA 81 mg. She again denies any epistaxis, melena, hematachezia, hematuria. She is post-menopausal. She again denies any previous history of bleeds or notable family history other than a sister with a DVT.   Review of Systems  Constitutional: Negative for fever, chills and diaphoresis.  HENT: Negative for nosebleeds.   Cardiovascular: Negative for chest pain.  Gastrointestinal: Negative for nausea, vomiting, abdominal pain, diarrhea, constipation and blood in stool.  Genitourinary: Negative for hematuria.  Neurological: Negative for dizziness, weakness, light-headedness and numbness.  Hematological: Bruises/bleeds easily.       Objective:   Physical Exam  Constitutional: She appears well-developed and well-nourished. No distress.  HENT:  Head: Normocephalic and atraumatic.  Mouth/Throat: Oropharynx is clear and moist.  Cardiovascular: Normal rate, regular rhythm, normal heart sounds and intact distal pulses.  Exam reveals no gallop and no friction rub.   No murmur heard. Pulmonary/Chest: Effort  normal and breath sounds normal. No respiratory distress. She has no wheezes.  Musculoskeletal:  No evidence of previous bruise on L arm. R lateral leg with faint buise ~4 cm across and 2 cm in height, nontender  Skin: She is not diaphoretic.  Vitals reviewed.         Assessment & Plan:

## 2014-12-19 NOTE — Assessment & Plan Note (Signed)
Ms Espino presented today for a new bruise. She recently had work-up on 12/07/14 notable for normal BMP, CBC, PT/INR, PTT for unexplained bruise on her L arm which has since resolved. Today she is seen for another unexplained bruise on her R lateral leg that is nearly healed. She again denies trauma, other signs of bleeding. She has no family history of bleed but has a sister with DVT. No previous bleeds. She has stopped using ibuprofen. I explained to her the results of her lab work and she was agreeable to reassurance. She was given clear return precautions. -RTC if new signs of bleeding (epistaxis, hematochezia, melena, hematuria), several bruises at once, bruising other than on extremites, or other concern

## 2014-12-19 NOTE — Patient Instructions (Signed)
It was a pleasure to see you today. Please return to clinic or seek medical attention if you have any new signs of bleeding (nose, stool that is bloody or black and tarry, blood in urine), new bruises on your trunk, several bruises at once, or other worrisome medical condition. We look forward to seeing you again as needed.  Lottie Mussel, MD  General Instructions:   Thank you for bringing your medicines today. This helps [place] keep you safe from mistakes.   Progress Toward Treatment Goals:  No flowsheet data found.  Self Care Goals & Plans:  No flowsheet data found.  No flowsheet data found.   Care Management & Community Referrals:  No flowsheet data found.

## 2014-12-19 NOTE — Progress Notes (Signed)
Case discussed with Dr. Ethelene Hal at time of visit. We reviewed the resident's history and exam and pertinent patient test results. I agree with the assessment, diagnosis, and plan of care documented in the resident's note.

## 2014-12-21 ENCOUNTER — Encounter: Payer: Self-pay | Admitting: Internal Medicine

## 2014-12-21 NOTE — Progress Notes (Signed)
Patient ID: Mary Jenkins, female   DOB: [birth year], 59 y.o.   MRN: [id number]  I am completing paperwork today from [facility] that requires my signature:  Received 12/11/14: authorization for substituting lidocaine ointment with lidocaine patch  Looking at her current medications, I do not see this ointment on there.

## 2014-12-22 NOTE — Progress Notes (Signed)
Do not fill, pt does not want lidocaine patches.  This was driven from pharmacy.

## 2015-01-04 DIAGNOSIS — C50911 Malignant neoplasm of unspecified site of right female breast: Secondary | ICD-10-CM | POA: Diagnosis not present

## 2015-02-01 ENCOUNTER — Telehealth: Payer: Self-pay | Admitting: Internal Medicine

## 2015-02-01 MED ORDER — AZITHROMYCIN 250 MG PO TABS: 250.0000 mg | ORAL_TABLET | ORAL | Status: DC

## 2015-02-01 NOTE — Telephone Encounter (Signed)
Allergies  Allergen Reactions  . Aspirin Other (See Comments)    Pt does not remember reaction      Pt called stating that she feels like she is getting a cold. States she has sneezing, scratchy throat, runny nose, prod cough with white mucus for a few days. Says she has used her albuterol neb twice since yesterday. She is asking to get an abx since she is going out of town next week. [facility].

## 2015-02-01 NOTE — Telephone Encounter (Signed)
ZPAK sent to [place] per pt request.  Aware of rec's per MW Nothing further needed.

## 2015-02-01 NOTE — Telephone Encounter (Signed)
Zpak/ see [place] by 5/13 pm if not doing better

## 2015-02-01 NOTE — Telephone Encounter (Signed)
lmtcb for pt.

## 2015-02-02 ENCOUNTER — Other Ambulatory Visit: Payer: Self-pay | Admitting: Internal Medicine

## 2015-02-02 NOTE — Telephone Encounter (Addendum)
As this patient was seen by me on 11/15/14 and deemed necessary for their treatment as documented in the EHR, I will refill this prescription for Neurontin as she has known radiculopathy.

## 2015-02-09 ENCOUNTER — Encounter (HOSPITAL_BASED_OUTPATIENT_CLINIC_OR_DEPARTMENT_OTHER): Payer: Self-pay | Admitting: *Deleted

## 2015-02-09 DIAGNOSIS — R0989 Other specified symptoms and signs involving the circulatory and respiratory systems: Secondary | ICD-10-CM

## 2015-02-09 DIAGNOSIS — R059 Cough, unspecified: Secondary | ICD-10-CM

## 2015-02-09 HISTORY — DX: Cough, unspecified: R05.9

## 2015-02-09 HISTORY — DX: Other specified symptoms and signs involving the circulatory and respiratory systems: R09.89

## 2015-02-10 MED ORDER — CEFAZOLIN SODIUM-DEXTROSE 2-3 GM-% IV SOLR
INTRAVENOUS | Status: AC
  Filled 2015-02-10: qty 50

## 2015-02-10 MED ORDER — ACETAMINOPHEN 500 MG PO TABS
ORAL_TABLET | ORAL | Status: AC
  Filled 2015-02-10: qty 2

## 2015-02-13 ENCOUNTER — Ambulatory Visit (HOSPITAL_BASED_OUTPATIENT_CLINIC_OR_DEPARTMENT_OTHER): Payer: Commercial Managed Care - HMO | Admitting: Anesthesiology

## 2015-02-13 ENCOUNTER — Encounter (HOSPITAL_BASED_OUTPATIENT_CLINIC_OR_DEPARTMENT_OTHER)
Admission: RE | Disposition: A | Discharge status: Home / Self Care | Payer: Self-pay | Source: Ambulatory Visit | Attending: Specialist

## 2015-02-13 ENCOUNTER — Ambulatory Visit (HOSPITAL_BASED_OUTPATIENT_CLINIC_OR_DEPARTMENT_OTHER)
Admission: RE | Admit: 2015-02-13 | Discharge: 2015-02-13 | Disposition: A | Discharge status: Home / Self Care | Payer: Commercial Managed Care - HMO | Source: Ambulatory Visit | Attending: Specialist | Admitting: Specialist

## 2015-02-13 ENCOUNTER — Encounter (HOSPITAL_BASED_OUTPATIENT_CLINIC_OR_DEPARTMENT_OTHER): Payer: Self-pay | Admitting: Anesthesiology

## 2015-02-13 DIAGNOSIS — H26003 Unspecified infantile and juvenile cataract, bilateral: Secondary | ICD-10-CM | POA: Diagnosis not present

## 2015-02-13 DIAGNOSIS — K219 Gastro-esophageal reflux disease without esophagitis: Secondary | ICD-10-CM | POA: Insufficient documentation

## 2015-02-13 DIAGNOSIS — M5030 Other cervical disc degeneration, unspecified cervical region: Secondary | ICD-10-CM | POA: Diagnosis not present

## 2015-02-13 DIAGNOSIS — Z87891 Personal history of nicotine dependence: Secondary | ICD-10-CM | POA: Diagnosis not present

## 2015-02-13 DIAGNOSIS — Z853 Personal history of malignant neoplasm of breast: Secondary | ICD-10-CM | POA: Diagnosis not present

## 2015-02-13 DIAGNOSIS — L910 Hypertrophic scar: Secondary | ICD-10-CM | POA: Diagnosis not present

## 2015-02-13 DIAGNOSIS — J449 Chronic obstructive pulmonary disease, unspecified: Secondary | ICD-10-CM | POA: Diagnosis not present

## 2015-02-13 DIAGNOSIS — Z9221 Personal history of antineoplastic chemotherapy: Secondary | ICD-10-CM | POA: Insufficient documentation

## 2015-02-13 DIAGNOSIS — L905 Scar conditions and fibrosis of skin: Secondary | ICD-10-CM | POA: Diagnosis present

## 2015-02-13 HISTORY — DX: Cough: R05

## 2015-02-13 HISTORY — DX: Other specified symptoms and signs involving the circulatory and respiratory systems: R09.89

## 2015-02-13 HISTORY — DX: Unspecified cataract: H26.9

## 2015-02-13 HISTORY — DX: Pure hypercholesterolemia, unspecified: E78.00

## 2015-02-13 HISTORY — PX: REMOVAL OF TISSUE EXPANDER AND PLACEMENT OF IMPLANT: SHX6457

## 2015-02-13 HISTORY — PX: SCAR REVISION: SHX5285

## 2015-02-13 HISTORY — DX: Personal history of malignant neoplasm of breast: Z85.3

## 2015-02-13 HISTORY — DX: Family history of other specified conditions: Z84.89

## 2015-02-13 LAB — POCT HEMOGLOBIN-HEMACUE: Hemoglobin: 12.3 g/dL (ref 12.0–15.0)

## 2015-02-13 SURGERY — REVISION, SCAR
Anesthesia: General | Site: Breast | Laterality: Right

## 2015-02-13 MED ORDER — CEFAZOLIN SODIUM-DEXTROSE 2-3 GM-% IV SOLR
2.0000 g | INTRAVENOUS | Status: AC
  Administered 2015-02-13: 2 g via INTRAVENOUS

## 2015-02-13 MED ORDER — HYDROMORPHONE HCL 1 MG/ML IJ SOLN
0.2500 mg | INTRAMUSCULAR | Status: DC | PRN
  Administered 2015-02-13: 0.5 mg via INTRAVENOUS

## 2015-02-13 MED ORDER — FENTANYL CITRATE (PF) 250 MCG/5ML IJ SOLN
INTRAMUSCULAR | Status: DC | PRN
  Administered 2015-02-13: 50 ug via INTRAVENOUS

## 2015-02-13 MED ORDER — MIDAZOLAM HCL 5 MG/5ML IJ SOLN
INTRAMUSCULAR | Status: DC | PRN
  Administered 2015-02-13: 2 mg via INTRAVENOUS

## 2015-02-13 MED ORDER — EPHEDRINE SULFATE 50 MG/ML IJ SOLN
INTRAMUSCULAR | Status: DC | PRN
  Administered 2015-02-13 (×2): 10 mg via INTRAVENOUS

## 2015-02-13 MED ORDER — PROPOFOL 10 MG/ML IV BOLUS
INTRAVENOUS | Status: DC | PRN
  Administered 2015-02-13: 200 mg via INTRAVENOUS

## 2015-02-13 MED ORDER — LACTATED RINGERS IV SOLN
INTRAVENOUS | Status: DC
  Administered 2015-02-13: 09:00:00 via INTRAVENOUS

## 2015-02-13 MED ORDER — HYDROMORPHONE HCL 1 MG/ML IJ SOLN
INTRAMUSCULAR | Status: AC
  Filled 2015-02-13: qty 1

## 2015-02-13 MED ORDER — CEFAZOLIN SODIUM-DEXTROSE 2-3 GM-% IV SOLR
INTRAVENOUS | Status: AC
  Filled 2015-02-13: qty 50

## 2015-02-13 MED ORDER — DEXAMETHASONE SODIUM PHOSPHATE 4 MG/ML IJ SOLN
INTRAMUSCULAR | Status: DC | PRN
  Administered 2015-02-13: 10 mg via INTRAVENOUS

## 2015-02-13 MED ORDER — ONDANSETRON HCL 4 MG/2ML IJ SOLN
INTRAMUSCULAR | Status: DC | PRN
  Administered 2015-02-13: 4 mg via INTRAVENOUS

## 2015-02-13 MED ORDER — LIDOCAINE HCL (CARDIAC) 20 MG/ML IV SOLN
INTRAVENOUS | Status: DC | PRN
  Administered 2015-02-13: 60 mg via INTRAVENOUS

## 2015-02-13 MED ORDER — LIDOCAINE-EPINEPHRINE 0.5 %-1:200000 IJ SOLN
INTRAMUSCULAR | Status: DC | PRN
  Administered 2015-02-13: 20 mL

## 2015-02-13 MED ORDER — MIDAZOLAM HCL 2 MG/2ML IJ SOLN
INTRAMUSCULAR | Status: AC
  Filled 2015-02-13: qty 2

## 2015-02-13 MED ORDER — FENTANYL CITRATE (PF) 100 MCG/2ML IJ SOLN
INTRAMUSCULAR | Status: AC
  Filled 2015-02-13: qty 6

## 2015-02-13 SURGICAL SUPPLY — 88 items
APL SKNCLS STERI-STRIP NONHPOA (GAUZE/BANDAGES/DRESSINGS) ×1
BAG DECANTER FOR FLEXI CONT (MISCELLANEOUS) ×3 IMPLANT
BANDAGE ELASTIC 4 VELCRO ST LF (GAUZE/BANDAGES/DRESSINGS) IMPLANT
BENZOIN TINCTURE PRP APPL 2/3 (GAUZE/BANDAGES/DRESSINGS) ×3 IMPLANT
BINDER BREAST LRG (GAUZE/BANDAGES/DRESSINGS) ×2 IMPLANT
BINDER BREAST MEDIUM (GAUZE/BANDAGES/DRESSINGS) IMPLANT
BINDER BREAST XLRG (GAUZE/BANDAGES/DRESSINGS) IMPLANT
BINDER BREAST XXLRG (GAUZE/BANDAGES/DRESSINGS) IMPLANT
BLADE KNIFE PERSONA 10 (BLADE) ×3 IMPLANT
BLADE KNIFE PERSONA 15 (BLADE) ×3 IMPLANT
BNDG COHESIVE 4X5 TAN STRL (GAUZE/BANDAGES/DRESSINGS) IMPLANT
BNDG GAUZE ELAST 4 BULKY (GAUZE/BANDAGES/DRESSINGS) IMPLANT
CANISTER SUCT 1200ML W/VALVE (MISCELLANEOUS) ×3 IMPLANT
COVER BACK TABLE 60X90IN (DRAPES) ×3 IMPLANT
COVER MAYO STAND STRL (DRAPES) ×3 IMPLANT
DECANTER SPIKE VIAL GLASS SM (MISCELLANEOUS) ×3 IMPLANT
DRAIN CHANNEL 10M FLAT 3/4 FLT (DRAIN) ×1 IMPLANT
DRAIN PENROSE 1/4X12 LTX STRL (WOUND CARE) IMPLANT
DRAPE LAPAROSCOPIC ABDOMINAL (DRAPES) ×3 IMPLANT
DRAPE LAPAROTOMY 100X72 PEDS (DRAPES) IMPLANT
DRAPE LAPAROTOMY TRNSV 102X78 (DRAPE) IMPLANT
DRAPE U-SHAPE 76X120 STRL (DRAPES) IMPLANT
DRSG PAD ABDOMINAL 8X10 ST (GAUZE/BANDAGES/DRESSINGS) ×6 IMPLANT
ELECT BLADE 6.5 .24CM SHAFT (ELECTRODE) IMPLANT
ELECT NDL TIP 2.8 STRL (NEEDLE) IMPLANT
ELECT NEEDLE TIP 2.8 STRL (NEEDLE) IMPLANT
ELECT REM PT RETURN 9FT ADLT (ELECTROSURGICAL) ×3
ELECTRODE REM PT RTRN 9FT ADLT (ELECTROSURGICAL) ×1 IMPLANT
EVACUATOR SILICONE 100CC (DRAIN) ×1 IMPLANT
FILTER 7/8 IN (FILTER) IMPLANT
GAUZE SPONGE 4X4 12PLY STRL (GAUZE/BANDAGES/DRESSINGS) ×3 IMPLANT
GAUZE SPONGE 4X4 16PLY XRAY LF (GAUZE/BANDAGES/DRESSINGS) IMPLANT
GAUZE XEROFORM 1X8 LF (GAUZE/BANDAGES/DRESSINGS) IMPLANT
GAUZE XEROFORM 5X9 LF (GAUZE/BANDAGES/DRESSINGS) ×3 IMPLANT
GLOVE BIO SURGEON STRL SZ 6.5 (GLOVE) ×2 IMPLANT
GLOVE BIO SURGEONS STRL SZ 6.5 (GLOVE) ×1
GLOVE BIOGEL M STRL SZ7.5 (GLOVE) ×3 IMPLANT
GLOVE BIOGEL PI IND STRL 8 (GLOVE) ×1 IMPLANT
GLOVE BIOGEL PI INDICATOR 8 (GLOVE) ×2
GLOVE ECLIPSE 7.0 STRL STRAW (GLOVE) ×3 IMPLANT
GOWN STRL REUS W/ TWL LRG LVL3 (GOWN DISPOSABLE) ×1 IMPLANT
GOWN STRL REUS W/ TWL XL LVL3 (GOWN DISPOSABLE) ×2 IMPLANT
GOWN STRL REUS W/TWL LRG LVL3 (GOWN DISPOSABLE) ×3
GOWN STRL REUS W/TWL XL LVL3 (GOWN DISPOSABLE) ×6
IV NS 500ML (IV SOLUTION)
IV NS 500ML BAXH (IV SOLUTION) ×4 IMPLANT
KIT FILL SYSTEM UNIVERSAL (SET/KITS/TRAYS/PACK) ×3 IMPLANT
NDL HYPO 25X1 1.5 SAFETY (NEEDLE) IMPLANT
NDL SPNL 18GX3.5 QUINCKE PK (NEEDLE) IMPLANT
NEEDLE HYPO 25X1 1.5 SAFETY (NEEDLE) ×3 IMPLANT
NEEDLE SPNL 18GX3.5 QUINCKE PK (NEEDLE) ×3 IMPLANT
NS IRRIG 1000ML POUR BTL (IV SOLUTION) ×3 IMPLANT
PACK BASIN DAY SURGERY FS (CUSTOM PROCEDURE TRAY) ×3 IMPLANT
PEN SKIN MARKING BROAD TIP (MISCELLANEOUS) ×3 IMPLANT
PIN SAFETY STERILE (MISCELLANEOUS) IMPLANT
SHEET MEDIUM DRAPE 40X70 STRL (DRAPES) IMPLANT
SHEETING SILICONE GEL EPI DERM (MISCELLANEOUS) ×2 IMPLANT
SLEEVE SCD COMPRESS KNEE MED (MISCELLANEOUS) ×3 IMPLANT
SPONGE LAP 18X18 X RAY DECT (DISPOSABLE) ×6 IMPLANT
STOCKINETTE IMPERVIOUS LG (DRAPES) IMPLANT
STRIP SUTURE WOUND CLOSURE 1/2 (SUTURE) IMPLANT
SUT FIBERWIRE 3-0 18 DIAM 3/8 (SUTURE)
SUT MNCRL AB 3-0 PS2 18 (SUTURE) ×3 IMPLANT
SUT MON AB 2-0 CT1 36 (SUTURE) ×3 IMPLANT
SUT MON AB 4-0 PC3 18 (SUTURE) IMPLANT
SUT MON AB 5-0 PS2 18 (SUTURE) IMPLANT
SUT PROLENE 3 0 PS 2 (SUTURE) IMPLANT
SUT PROLENE 4 0 PS 2 18 (SUTURE) IMPLANT
SUT PROLENE 5 0 P 3 (SUTURE) IMPLANT
SUT PROLENE 5 0 PS 2 (SUTURE) IMPLANT
SUT PROLENE 6 0 P 1 18 (SUTURE) IMPLANT
SUTURE FIBERWR 3-0 18 DIAM 3/8 (SUTURE) IMPLANT
SYR 20CC LL (SYRINGE) IMPLANT
SYR 50ML LL SCALE MARK (SYRINGE) ×6 IMPLANT
SYR BULB IRRIGATION 50ML (SYRINGE) ×3 IMPLANT
SYR CONTROL 10ML LL (SYRINGE) ×3 IMPLANT
TAPE HYPAFIX 6 X30' (GAUZE/BANDAGES/DRESSINGS) ×1
TAPE HYPAFIX 6X30 (GAUZE/BANDAGES/DRESSINGS) ×2 IMPLANT
TAPE MEASURE 72IN RETRACT (INSTRUMENTS)
TAPE MEASURE LINEN 72IN RETRCT (INSTRUMENTS) IMPLANT
TAPE PAPER 1X10 WHT MICROPORE (GAUZE/BANDAGES/DRESSINGS) IMPLANT
TOWEL OR 17X24 6PK STRL BLUE (TOWEL DISPOSABLE) ×12 IMPLANT
TRAY DSU PREP LF (CUSTOM PROCEDURE TRAY) ×3 IMPLANT
TUBE CONNECTING 20'X1/4 (TUBING) ×1
TUBE CONNECTING 20X1/4 (TUBING) ×2 IMPLANT
UNDERPAD 30X30 (UNDERPADS AND DIAPERS) ×6 IMPLANT
VAC PENCILS W/TUBING CLEAR (MISCELLANEOUS) ×3 IMPLANT
YANKAUER SUCT BULB TIP NO VENT (SUCTIONS) ×3 IMPLANT

## 2015-02-13 NOTE — Brief Op Note (Signed)
02/13/2015  9:37 AM  PATIENT:  [name]  59 y.o. female  PRE-OPERATIVE DIAGNOSIS:  BREAST CANCER  POST-OPERATIVE DIAGNOSIS:  BREAST CANCER  PROCEDURE:  Procedure(s): SCAR REVISION RIGHT BREAST (Right) REMOVAL OF TISSUE EXPANDER PORT RIGHT BREAST (Right)  SURGEON:  Surgeon(s) and Role:    * Cristine Polio, MD - Primary  PHYSICIAN ASSISTANT:   ASSISTANTS: none   ANESTHESIA:   general  EBL:  Total I/O In: 1000 [I.V.:1000] Out: -   BLOOD ADMINISTERED:none  DRAINS: none   LOCAL MEDICATIONS USED:  XYLOCAINE   SPECIMEN:  Excision  DISPOSITION OF SPECIMEN:  PATHOLOGY  COUNTS:  YES  TOURNIQUET:  * No tourniquets in log *  DICTATION: .Other Dictation: Dictation Number [id number]  PLAN OF CARE: Discharge to home after PACU  PATIENT DISPOSITION:  PACU - hemodynamically stable.   Delay start of Pharmacological VTE agent (>24hrs) due to surgical blood loss or risk of bleeding: yes

## 2015-02-13 NOTE — Discharge Instructions (Signed)
Activity (include date of return to work if known) As tolerated: NO showers NO driving No heavy activities  Diet:regular No restrictions:  Wound Care: Keep dressing clean & dry  Do not change dressings Special Instructions: Do not raise arms up Call Doctor if any unusual problems occur such as pain, excessive Bleeding, unrelieved Nausea/vomiting, Fever &/or chills When lying down, keep head elevated on 2-3 pillows or back-rest Follow-up appointment: Please call the office.  The patient received discharge instruction from:___________________________________________   Post Anesthesia Home Care Instructions  Activity: Get plenty of rest for the remainder of the day. A responsible adult should stay with you for 24 hours following the procedure.  For the next 24 hours, DO NOT: -Drive a car -Paediatric nurse -Drink alcoholic beverages -Take any medication unless instructed by your physician -Make any legal decisions or sign important papers.  Meals: Start with liquid foods such as gelatin or soup. Progress to regular foods as tolerated. Avoid greasy, spicy, heavy foods. If nausea and/or vomiting occur, drink only clear liquids until the nausea and/or vomiting subsides. Call your physician if vomiting continues.  Special Instructions/Symptoms: Your throat may feel dry or sore from the anesthesia or the breathing tube placed in your throat during surgery. If this causes discomfort, gargle with warm salt water. The discomfort should disappear within 24 hours.  If you had a scopolamine patch placed behind your ear for the management of post- operative nausea and/or vomiting:  1. The medication in the patch is effective for 72 hours, after which it should be removed.  Wrap patch in a tissue and discard in the trash. Wash hands thoroughly with soap and water. 2. You may remove the patch earlier than 72 hours if you experience unpleasant side effects which may include dry mouth, dizziness  or visual disturbances. 3. Avoid touching the patch. Wash your hands with soap and water after contact with the patch.      Patient signature ________________________________________ / Date___________    Signature of individual providing instructions/ Date________________

## 2015-02-13 NOTE — Anesthesia Postprocedure Evaluation (Signed)
  Anesthesia Post-op Note  Patient: Mary Jenkins  Procedure(s) Performed: Procedure(s): SCAR REVISION RIGHT BREAST (Right) REMOVAL OF TISSUE EXPANDER PORT RIGHT BREAST (Right)  Patient Location: PACU  Anesthesia Type:General  Level of Consciousness: awake  Airway and Oxygen Therapy: Patient Spontanous Breathing  Post-op Pain: mild  Post-op Assessment: Post-op Vital signs reviewed  Post-op Vital Signs: Reviewed  Last Vitals:  Filed Vitals:   02/13/15 1045  BP: 126/70  Pulse: 53  Temp:   Resp: 11    Complications: No apparent anesthesia complications

## 2015-02-13 NOTE — Anesthesia Preprocedure Evaluation (Addendum)
Anesthesia Evaluation  Patient identified by MRN, date of birth, ID band Patient awake    Reviewed: Allergy & Precautions, NPO status , Patient's Chart, lab work & pertinent test results  Airway Mallampati: II  TM Distance: >3 FB Neck ROM: Full    Dental   Pulmonary shortness of breath, COPDformer smoker,  breath sounds clear to auscultation        Cardiovascular negative cardio ROS  Rhythm:Regular Rate:Normal     Neuro/Psych    GI/Hepatic Neg liver ROS, GERD-  ,  Endo/Other  negative endocrine ROS  Renal/GU negative Renal ROS     Musculoskeletal  (+) Arthritis -,   Abdominal   Peds  Hematology   Anesthesia Other Findings   Reproductive/Obstetrics                           Anesthesia Physical Anesthesia Plan  ASA: III  Anesthesia Plan:    Post-op Pain Management:    Induction: Intravenous  Airway Management Planned: Oral ETT  Additional Equipment:   Intra-op Plan:   Post-operative Plan: Extubation in OR  Informed Consent: I have reviewed the patients History and Physical, chart, labs and discussed the procedure including the risks, benefits and alternatives for the proposed anesthesia with the patient or authorized representative who has indicated his/her understanding and acceptance.   Dental advisory given  Plan Discussed with: Anesthesiologist and CRNA  Anesthesia Plan Comments:         Anesthesia Quick Evaluation

## 2015-02-13 NOTE — H&P (Signed)
Mary Jenkins is an 59 y.o. female.   Chief Complaint: Right breast reconstruction with port still in and scar cicatrix HPI: Hx of right breast cancer with tissue expansion   Has a port still present with scar cicatrix right breast  Past Medical History  Diagnosis Date  . Personal history of chemotherapy 1997  . GERD (gastroesophageal reflux disease) 02/03/2012  . DDD (degenerative disc disease), cervical   . Full dentures   . Exertional shortness of breath     states if she "takes her time" doing activities does not get SOB  . COPD (chronic obstructive pulmonary disease)     no home O2  . History of breast cancer   . High cholesterol     no current med.  . Family history of adverse reaction to anesthesia     pt's sister has hx. of post-op N/V  . Cataract, immature     bilateral  . Cough 02/09/2015  . Runny nose 02/09/2015    clear drainage, per pt.    Past Surgical History  Procedure Laterality Date  . Tubal ligation  1980's  . Colonoscopy    . Mastectomy, partial Right 1997    -node dissection  . Breast capsulotomy with implant exchange Right 06/21/2013    Procedure: REVISION RIGHT BREAST RECONSTRUCTION/REMOVAL OF RIGHT IMPLANT/RIGHT BREAST CAPSULOTOMY WITH INSERT TISSUE EXPLANDER RIGHT BREAST/POSSIBLE LEFT BREAST MASTOPEXY;  Surgeon: Cristine Polio, MD;  Location: [place];  Service: Plastics;  Laterality: Right;  . Tissue expander placement Right 03/14/2014    Procedure: SALINE REMOVAL RIGHT TISSUE EXPANDER;  Surgeon: Cristine Polio, MD;  Location: [place];  Service: Plastics;  Laterality: Right;  . Breast surgery  97    implant  . Breast reconstruction Right 06/21/2013    Procedure: BREAST RECONSTRUCTION;  Surgeon: Cristine Polio, MD;  Location: [place];  Service: Plastics;  Laterality: Right;  . Breast reconstruction Left 11/29/2013    Procedure: LEFT MASTOPEXY FOR RECONSTRUCTION;  Surgeon: Cristine Polio, MD;   Location: [place];  Service: Plastics;  Laterality: Left;  . Breast reconstruction Right 03/14/2014    Procedure: RECONSTRUCTION NIPPLE RIGHT BREAST;  Surgeon: Cristine Polio, MD;  Location: [employer];  Service: Plastics;  Laterality: Right;    Family History  Problem Relation Age of Onset  . Hypertension Sister   . Anesthesia problems Sister     post-op N/V  . Hypertension Mother   . Heart disease Brother    Social History:  reports that she quit smoking about 2 years ago. She has never used smokeless tobacco. She reports that she does not drink alcohol or use illicit drugs.  Allergies: No Known Allergies  No prescriptions prior to admission    No results found for this or any previous visit (from the past 48 hour(s)). No results found.  Review of Systems  Constitutional: Negative.   HENT: Negative.   Eyes: Negative.   Respiratory: Negative.   Cardiovascular: Negative.   Gastrointestinal: Negative.   Genitourinary: Negative.   Musculoskeletal: Negative.   Skin: Negative.   Neurological: Negative.   Endo/Heme/Allergies: Negative.   Psychiatric/Behavioral: Negative.     Height 5' 5.5" (1.664 m), weight 58.968 kg (130 lb). Physical Exam   Assessment/Plan Right breast cancer with implant reconstruction and severe scar cicatrix  For excision of scar with reconstruction and removal of port  Daxton Nydam L 02/13/2015, 6:44 AM

## 2015-02-13 NOTE — Transfer of Care (Signed)
Immediate Anesthesia Transfer of Care Note  Patient: Mary Jenkins  Procedure(s) Performed: Procedure(s): SCAR REVISION RIGHT BREAST (Right) REMOVAL OF TISSUE EXPANDER PORT RIGHT BREAST (Right)  Patient Location: PACU  Anesthesia Type:General  Level of Consciousness: awake and sedated  Airway & Oxygen Therapy: Patient Spontanous Breathing and Patient connected to face mask oxygen  Post-op Assessment: Report given to RN and Post -op Vital signs reviewed and stable  Post vital signs: Reviewed and stable  Last Vitals:  Filed Vitals:   02/13/15 0829  BP: 125/78  Pulse: 74  Temp: 36.9 C  Resp: 22    Complications: No apparent anesthesia complications

## 2015-02-13 NOTE — Anesthesia Procedure Notes (Signed)
Procedure Name: LMA Insertion Performed by: Terrance Mass Pre-anesthesia Checklist: Patient identified, Timeout performed, Emergency Drugs available, Suction available and Patient being monitored Patient Re-evaluated:Patient Re-evaluated prior to inductionOxygen Delivery Method: Circle system utilized Preoxygenation: Pre-oxygenation with 100% oxygen Intubation Type: IV induction Ventilation: Mask ventilation without difficulty LMA: LMA inserted LMA Size: 4.0 Tube size: 4.0 mm Number of attempts: 1 Placement Confirmation: positive ETCO2 Tube secured with: Tape Dental Injury: Teeth and Oropharynx as per pre-operative assessment

## 2015-02-14 ENCOUNTER — Encounter (HOSPITAL_BASED_OUTPATIENT_CLINIC_OR_DEPARTMENT_OTHER): Payer: Self-pay | Admitting: Specialist

## 2015-02-14 NOTE — Op Note (Signed)
NAMECAMALA, Jenkins                ACCOUNT NO.:  [id number]  MEDICAL RECORD NO.:  [id number]  LOCATION:                                FACILITY:  [facility]  PHYSICIAN:  Berneta Sages L. Olla Delancey, M.D.DATE OF BIRTH:  1955-12-11  DATE OF PROCEDURE:  02/13/2015 DATE OF DISCHARGE:  02/13/2015                              OPERATIVE REPORT   INDICATIONS:  This 59 year old lady with a history of right breast cancer, had a previous reconstruction done by myself and tissue expansion.  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  PROCEDURE:  Planned, excision of tissue expander port, excision of severe scar in the right breast with reconstruction.  ANESTHESIA:  General.  DESCRIPTION OF PROCEDURE:  Preoperatively, the patient underwent drawings for the above area.  She underwent general anesthesia, intubated orally.  Prep was done to the chest, breast areas in routine fashion using Hibiclens soap and solution, walled off with sterile towels and drapes, so as to make a sterile field.  0.5% Xylocaine with epinephrine, 1:200,000 concentration injected locally, total of 50 mL. The severe scar cicatrix was scored with #15 and 20 blades.  The excess skin removed and dissection was carried over to the port.  Dissection carried down to the port and it was dissected out and then removed from the tissue expander with gentle extrusion.  After this superior and inferior flaps were freed up significantly to allow a plastic closure of the scar over the chest, deep sutures of 2-0 Monocryl x2 layers were placed in a running subcuticular stitch of 3-0 Monocryl.  Steri-Strips and soft dressing were applied including the silicone gel patches.  She withstood the procedures very well and was taken to recovery in excellent condition.  Withstood back in the office for approximately 1 week for re-examination.     Odella Aquas. Towanda Malkin,  M.D.     Elie Confer  D:  02/13/2015  T:  02/13/2015  Job:  [id number]

## 2015-02-16 ENCOUNTER — Encounter (HOSPITAL_BASED_OUTPATIENT_CLINIC_OR_DEPARTMENT_OTHER): Payer: Self-pay | Admitting: Specialist

## 2015-02-16 ENCOUNTER — Telehealth: Payer: Self-pay | Admitting: Internal Medicine

## 2015-02-16 MED ORDER — UMECLIDINIUM BROMIDE 62.5 MCG/INH IN AEPB: 1.0000 | INHALATION_SPRAY | Freq: Every day | RESPIRATORY_TRACT | Status: DC

## 2015-02-16 NOTE — Telephone Encounter (Signed)
Spoke with pt, states her pharmacy does not keep incruse in stock so she is wanting to switch to an inhaler that is more easily obtainable.  Dr. Melvyn Novas please advise on alternative.  Thanks!

## 2015-02-16 NOTE — Telephone Encounter (Signed)
Yes but they are not interchangable and will need ov to sort out - in meantime ok to use samples

## 2015-02-16 NOTE — Telephone Encounter (Signed)
Pt scheduled for 02/28/15 with MW, samples left up front for pt in the interim.  Nothing further needed.

## 2015-02-21 ENCOUNTER — Encounter: Payer: Self-pay | Admitting: Internal Medicine

## 2015-02-21 ENCOUNTER — Ambulatory Visit (INDEPENDENT_AMBULATORY_CARE_PROVIDER_SITE_OTHER): Payer: Commercial Managed Care - HMO | Admitting: Internal Medicine

## 2015-02-21 VITALS — BP 113/73 | HR 70 | Temp 98.0°F | Ht 65.5 in | Wt 128.7 lb

## 2015-02-21 DIAGNOSIS — M5412 Radiculopathy, cervical region: Secondary | ICD-10-CM

## 2015-02-21 DIAGNOSIS — Z853 Personal history of malignant neoplasm of breast: Secondary | ICD-10-CM | POA: Diagnosis not present

## 2015-02-21 NOTE — Assessment & Plan Note (Signed)
Patient states that she was request to get a referral from her PCP by her plastic surgeon Dr Towanda Malkin. She is undergoing breast reconstruction after treatment for breast cancer.   Plan  Referral to plastic surgery per patient request.

## 2015-02-21 NOTE — Assessment & Plan Note (Signed)
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 She was seen by neurosurgery on 06/01/14 (and again 2-3 weeks later), given 2 week course of Prednisone which she has since completed. States her neck pain has almost completely resolved. She reports that pain has somehow increased recently and she plans to see her neurosurgeon. Neurologic exam is unremarkable.  Plan  -Encouraged her follow up with her neurosurgery.

## 2015-02-21 NOTE — Progress Notes (Signed)
Case discussed with Dr. Alice Rieger at time of visit. We reviewed the resident's history and exam and pertinent patient test results. I agree with the assessment, diagnosis, and plan of care documented in the resident's note.

## 2015-02-21 NOTE — Patient Instructions (Signed)
I have put in a referral for you to see Dr Jeoffrey Massed  Please be sure to follow up with Dr Shawnie Pons for your back pain

## 2015-02-21 NOTE — Progress Notes (Signed)
Patient ID: Mary Jenkins, female   DOB: [birth year], 59 y.o.   MRN: [id number]   Subjective:   HPI: [name] is a 59 y.o. woman with past medical history below presents with a request for referral to plastic surgery. Patient states that she is currently undergoing breast reconstruction surgery by Dr. Towanda Malkin. He states that he has requested him to get a referral due to her recent change of her insurance status.  Patient also has chronic pain due to degenerative disc disease in the cervical spine. She used to see a chronic pain doctor, but she has not followed up. She plans to go and see her doctor. She states that her pain has somehow increased lately. She believes that steroidal injection, which she received previously might be able to help. She does not report new neurological symptoms currently.    Past Medical History  Diagnosis Date  . Personal history of chemotherapy 1997  . GERD (gastroesophageal reflux disease) 02/03/2012  . DDD (degenerative disc disease), cervical   . Full dentures   . Exertional shortness of breath     states if she "takes her time" doing activities does not get SOB  . COPD (chronic obstructive pulmonary disease)     no home O2  . History of breast cancer   . High cholesterol     no current med.  . Family history of adverse reaction to anesthesia     pt's sister has hx. of post-op N/V  . Cataract, immature     bilateral  . Cough 02/09/2015  . Runny nose 02/09/2015    clear drainage, per pt.    ROS: Constitutional:  Denies fevers, chills, diaphoresis, appetite change and fatigue.  Respiratory: Denies SOB, DOE, cough, chest tightness, and wheezing.  CVS: No chest pain, palpitations and leg swelling.  GI: No abdominal pain, nausea, vomiting, bloody stools GU: No dysuria, frequency, hematuria, or flank pain.  MSK: No myalgias, joint swelling, arthralgias  Psych: No depression symptoms. No SI or SA.    Objective:  Physical Exam: Filed  Vitals:   02/21/15 0954  BP: 113/73  Pulse: 70  Temp: 98 F (36.7 C)  TempSrc: Oral  Height: 5' 5.5" (1.664 m)  Weight: 128 lb 11.2 oz (58.378 kg)  SpO2: 95%   General: Well nourished. No acute distress.  HEENT: Normal oral mucosa. MMM.  Lungs: she has bandanging around her breast area. CTA bilaterally. No wheezing. Heart: RRR; no extra sounds or murmurs  Abdomen: Non-distended, normal bowel sounds, soft, nontender; no hepatosplenomegaly  Extremities: No pedal edema. No joint swelling or tenderness. Neurologic: Normal EOM,  Alert and oriented x3. No obvious neurologic/cranial nerve deficits.  Assessment & Plan:  Discussed case with Dr. Eppie Gibson See problem based charting for assessment and plan.

## 2015-02-28 ENCOUNTER — Encounter: Payer: Self-pay | Admitting: Internal Medicine

## 2015-02-28 ENCOUNTER — Ambulatory Visit (INDEPENDENT_AMBULATORY_CARE_PROVIDER_SITE_OTHER): Payer: Commercial Managed Care - HMO | Admitting: Internal Medicine

## 2015-02-28 VITALS — BP 102/68 | HR 91 | Ht 64.0 in | Wt 129.2 lb

## 2015-02-28 DIAGNOSIS — J449 Chronic obstructive pulmonary disease, unspecified: Secondary | ICD-10-CM

## 2015-02-28 MED ORDER — TIOTROPIUM BROMIDE MONOHYDRATE 2.5 MCG/ACT IN AERS: INHALATION_SPRAY | RESPIRATORY_TRACT | Status: DC

## 2015-02-28 NOTE — Patient Instructions (Signed)
Stop incruse and try spiriva respimat 2 pffs each am to see if makes any difference in your cough    If you are satisfied with your treatment plan,  let your doctor know and he/she can either refill your medications or you can return here when your prescription runs out.     If in any way you are not 100% satisfied,  please tell us.  If 100% better, tell your friends!  Pulmonary follow up is as needed

## 2015-02-28 NOTE — Progress Notes (Signed)
Subjective:    Patient ID: Mary Jenkins, female    DOB: [birth year]  MRN: [id number]    Brief patient profile:  11 yobf quit smoking  07/24/11 not taking breathing medications at that point and subsequently placed on multiple meds and still sob just getting dressed so referred from the medicine clinic at [facility] 02/13/2012 for pulmonary eval with documented GOLD IV COPD 02/2013    HPI 02/13/2012 1st pulmonary cc progressive worse x 6 months doe x 50-100 ft can't do a grocery store where could do before quit. No real variability, cough is better with no excess or purulent sputum.  Not using saba daytime because finds if she's holding still doesn't need it as oftern. Some better p last  Prednisone rx. Not much variability. rec Stop atrovent and advair Start symbiocort Take 2 puffs first thing in am and then another 2 puffs about 12 hours later.  Take after only am dose Spiriva Protonix 40 mg Take 30-60 min before first meal of the day  GERD diet Only use your albuterol (Plan B= ventolin puffer,  Plan C is nebulizer) as a rescue medication to be used if you can't catch your breath by resting or doing a relaxed purse lip breathing pattern. The less you use it, the better it will work when you need it. Ok to use rescue up to every 4 hours if doing poorly.    Phone call 06/06/14  Reports she feels "like I'm catching a cold."  C/o Increased DOE and wheezing.  Sxs started yesterday.   No chest tightness or cough. Has tried albuterol hfa and is taking maintanence meds.> ov    06/06/2014 f/u ov/Inara Dike quit smoking 06/2012 re: GOLD IV copd  Chief Complaint  Patient presents with  . Acute Visit    Pt c/o increased DOE x 2 days- SOB with walking from room to room at home. She also has noticed wheezing off and on for the past few days. Not using rescue inhaler but did use neb 1 day ago.   onset of   "cold" symptoms 06/04/14= scratchy throat nasal congestion  baseline no need for the saba hfa or neb/ maint on  spira/symbicort and acid rx Also new R leg pain behind knee  x one week, no swelling no h/o injury or prior dvt rec Only use your albuterol inhaler as a rescue medication  Only use the nebulizer if not better after inhaler, ok to use up to every 4 hours if needed   11/11/2014 f/u ov/Keigan Girten re: GOLD IV copd maint rx symbicort  160 2bid and spiriva  Chief Complaint  Patient presents with  . Follow-up    Pt here to discuss alternative to Spiriva since her insurance does not cover. She did not bring her formulary. She ran out of spiriva 6 days ago. She c/o increased SOB "for a while" and is using her rescue inhaler 5 x per day on average.   doe x getting dressed/ shower / across parking lot  avg use of saba hfa maybe once a day before ran out of spiriva  now up to 4 x daily  rec incruse 2 pffs off of one click after symbicort am dose only F/u prn    02/28/2015 f/u ov/Diaz Crago re: COPD IV s/p smoking cessation 2013 / on symbiocrt 160 2bid  Chief Complaint  Patient presents with  . Follow-up    Pt here to discuss the need for Incruse. Pt states worsening cough since starting the medicine.  Pt states that it is not always available at her pharmacy. Pt c/o today of semi-productive cough with clear mucus   Not limited by breathing from desired activities  As long as walks slower than most = MMRC2  incruse making her cough / no need for saba at all    No obvious daytime variabilty or assoc cp or chest tightness, or overt   hb symptoms. No unusual exp hx or h/o childhood pna/ asthma or premature birth to his knowledge.     Sleeping ok without nocturnal  or early am exacerbation  of respiratory  c/o's or need for noct saba. Also denies any obvious fluctuation of symptoms with weather or environmental changes or other aggravating or alleviating factors except as outlined above.  Current Medications, Allergies, Past Medical History, Past Surgical History, Family History, and Social History were reviewed in  [employer] record.  ROS  The following are not active complaints unless bolded sore throat, dysphagia, dental problems, itching, sneezing,  nasal congestion or excess/ purulent secretions, ear ache,   fever, chills, sweats, unintended wt loss, pleuritic or exertional cp, hemoptysis,  orthopnea pnd or leg swelling, presyncope, palpitations, heartburn, abdominal pain, anorexia, nausea, vomiting, diarrhea  or change in bowel or urinary habits, change in stools or urine, dysuria,hematuria,  rash, arthralgias, visual complaints, headache, numbness weakness or ataxia or problems with walking or coordination,  change in mood/affect or memory.             Objective:   Physical Exam  amb bf nad    Wt 120 02/13/2012  > 119 03/13/2012 > 04/22/2012 117 > 07/21/2012 122> 12/02/2012  131> 03/22/2013  131 > 04/19/2013  129  > 07/19/2013 131 > 06/06/2014 129 >  11/11/2014  132> 02/28/2015   129    HEENT mild turbinate edema.  Oropharynx edentulous no excess pnd or cobblestoning. Mild thrush under lower dentures.  No JVD or cervical adenopathy. Mild accessory muscle hypertrophy. Trachea midline, nl thryroid. Chest was hyperinflated by percussion with diminished breath sounds and moderate increased exp time without wheeze. Hoover sign positive at mid inspiration. Regular rate and rhythm without murmur gallop or rub or increase P2 or edema.  Abd: no hsm, nl excursion. Ext warm without cyanosis or clubbing.             Assessment & Plan:

## 2015-03-01 ENCOUNTER — Other Ambulatory Visit: Payer: Self-pay | Admitting: Internal Medicine

## 2015-03-01 DIAGNOSIS — J449 Chronic obstructive pulmonary disease, unspecified: Secondary | ICD-10-CM

## 2015-03-05 ENCOUNTER — Encounter: Payer: Self-pay | Admitting: Internal Medicine

## 2015-03-05 NOTE — Assessment & Plan Note (Addendum)
-  FEV1 0.39 ( 16%) ratio 23 and 16% better p B2    - 03/13/2012  Inhaler technique 90% with dpi but only 50% with mdi > 50% 04/22/12   - Alpha one genotype 02/13/12 > MM   - 03/22/2013  PFT's 0.56 ( 24%) ratio 33 but 12% better improvement and DLCO 21 corrects to 28%    started Incruse 11/11/14 p spiriva off formulary> d/c 02/28/2015 due to cough - 02/28/2015 p extensive coaching HFA effectiveness =    90%  - changed incruse to spiriva respimat 02/28/2015 due to cough   I had an extended final summary discussion with the patient reviewing all relevant studies completed to date and  lasting 15 to 20 minutes of a 25 minute visit on the following issues:    1) not all lama's are created equal and in this case the elipta/dpi device appears to be making her cough  2)  The proper method of use, as well as anticipated side effects, of a  respimat inhaler are discussed and demonstrated to the patient. Improved effectiveness after extensive coaching during this visit to a level of approximately  90% > try spiriva respimat/ note did not make her cough at office  3) Formulary restrictions will be an ongoing challenge for the forseable future and I would be happy to pick an alternative if the pt will first  provide me a list of them but pt  will need to return here for training for any new device that is required eg dpi vs hfa vs respimat.    In meantime we can always provide samples so the patient never runs out of any needed respiratory medications.  4) Each maintenance medication was reviewed in detail including most importantly the difference between maintenance and as needed and under what circumstances the prns are to be used.  Please see instructions for details which were reviewed in writing and the patient given a copy.

## 2015-03-09 ENCOUNTER — Encounter: Payer: Self-pay | Admitting: Internal Medicine

## 2015-03-09 NOTE — Progress Notes (Signed)
Patient ID: Mary Jenkins, female   DOB: [birth year], 59 y.o.   MRN: [id number]   I have reviewed the office notes from Dr. Cristine Polio [facility]]:  06/21/13: Breast implant exchange, breast implant removal, breast reconstruction, capsulectomy  11/29/13: Left mastopexy  03/14/14: Right nipple area areolar complex reconstruction I will include these updates in the patient's chart.  04/14/14: Postoperative assessment of her breast reconstruction  10/23/14: Noted to have an additional 90 mL saline inserted into right breast implant in October 2015 with plan to follow-up in 3 months to remove port  01/04/15: Stable clinical assessment with plan to remove tissue expander

## 2015-04-28 ENCOUNTER — Other Ambulatory Visit: Payer: Self-pay | Admitting: Internal Medicine

## 2015-04-28 DIAGNOSIS — Z1231 Encounter for screening mammogram for malignant neoplasm of breast: Secondary | ICD-10-CM

## 2015-04-29 ENCOUNTER — Emergency Department (HOSPITAL_COMMUNITY)
Admission: EM | Admit: 2015-04-29 | Discharge: 2015-04-30 | Disposition: A | Discharge status: Home / Self Care | Payer: Commercial Managed Care - HMO | Attending: Emergency Medicine | Admitting: Emergency Medicine

## 2015-04-29 ENCOUNTER — Emergency Department (HOSPITAL_COMMUNITY): Payer: Commercial Managed Care - HMO

## 2015-04-29 ENCOUNTER — Encounter (HOSPITAL_COMMUNITY): Payer: Self-pay | Admitting: *Deleted

## 2015-04-29 DIAGNOSIS — Z8639 Personal history of other endocrine, nutritional and metabolic disease: Secondary | ICD-10-CM | POA: Insufficient documentation

## 2015-04-29 DIAGNOSIS — Z8669 Personal history of other diseases of the nervous system and sense organs: Secondary | ICD-10-CM | POA: Diagnosis not present

## 2015-04-29 DIAGNOSIS — Z79899 Other long term (current) drug therapy: Secondary | ICD-10-CM | POA: Diagnosis not present

## 2015-04-29 DIAGNOSIS — J441 Chronic obstructive pulmonary disease with (acute) exacerbation: Secondary | ICD-10-CM | POA: Diagnosis not present

## 2015-04-29 DIAGNOSIS — K219 Gastro-esophageal reflux disease without esophagitis: Secondary | ICD-10-CM | POA: Diagnosis not present

## 2015-04-29 DIAGNOSIS — Z87891 Personal history of nicotine dependence: Secondary | ICD-10-CM | POA: Insufficient documentation

## 2015-04-29 DIAGNOSIS — Z972 Presence of dental prosthetic device (complete) (partial): Secondary | ICD-10-CM | POA: Insufficient documentation

## 2015-04-29 DIAGNOSIS — R0602 Shortness of breath: Secondary | ICD-10-CM | POA: Diagnosis present

## 2015-04-29 DIAGNOSIS — Z853 Personal history of malignant neoplasm of breast: Secondary | ICD-10-CM | POA: Insufficient documentation

## 2015-04-29 LAB — CBC
HEMATOCRIT: 34.8 % — AB (ref 36.0–46.0)
HEMOGLOBIN: 11.3 g/dL — AB (ref 12.0–15.0)
MCH: 25.9 pg — ABNORMAL LOW (ref 26.0–34.0)
MCHC: 32.5 g/dL (ref 30.0–36.0)
MCV: 79.6 fL (ref 78.0–100.0)
PLATELETS: 251 10*3/uL (ref 150–400)
RBC: 4.37 MIL/uL (ref 3.87–5.11)
RDW: 16.3 % — ABNORMAL HIGH (ref 11.5–15.5)
WBC: 7.3 10*3/uL (ref 4.0–10.5)

## 2015-04-29 LAB — COMPREHENSIVE METABOLIC PANEL
ALT: 11 U/L — ABNORMAL LOW (ref 14–54)
AST: 17 U/L (ref 15–41)
Albumin: 3.2 g/dL — ABNORMAL LOW (ref 3.5–5.0)
Alkaline Phosphatase: 67 U/L (ref 38–126)
Anion gap: 8 (ref 5–15)
BILIRUBIN TOTAL: 0.2 mg/dL — AB (ref 0.3–1.2)
BUN: 10 mg/dL (ref 6–20)
CALCIUM: 8 mg/dL — AB (ref 8.9–10.3)
CO2: 25 mmol/L (ref 22–32)
Chloride: 104 mmol/L (ref 101–111)
Creatinine, Ser: 1.13 mg/dL — ABNORMAL HIGH (ref 0.44–1.00)
GFR calc Af Amer: >60 mL/min (ref >60)
GFR calc non Af Amer: 53 mL/min — ABNORMAL LOW (ref >60)
GLUCOSE: 97 mg/dL (ref 65–99)
Potassium: 3.5 mmol/L (ref 3.5–5.1)
Sodium: 137 mmol/L (ref 135–145)
TOTAL PROTEIN: 5.9 g/dL — AB (ref 6.5–8.1)

## 2015-04-29 LAB — BRAIN NATRIURETIC PEPTIDE: B Natriuretic Peptide: 53 pg/mL (ref 0.0–100.0)

## 2015-04-29 LAB — I-STAT TROPONIN, ED: TROPONIN I, POC: 0.01 ng/mL (ref 0.00–0.08)

## 2015-04-29 MED ORDER — PREDNISONE 20 MG PO TABS
60.0000 mg | ORAL_TABLET | Freq: Once | ORAL | Status: AC
  Administered 2015-04-29: 60 mg via ORAL
  Filled 2015-04-29: qty 3

## 2015-04-29 MED ORDER — IPRATROPIUM BROMIDE 0.02 % IN SOLN
0.5000 mg | Freq: Once | RESPIRATORY_TRACT | Status: AC
  Administered 2015-04-29: 0.5 mg via RESPIRATORY_TRACT
  Filled 2015-04-29: qty 2.5

## 2015-04-29 MED ORDER — ALBUTEROL (5 MG/ML) CONTINUOUS INHALATION SOLN
10.0000 mg/h | INHALATION_SOLUTION | Freq: Once | RESPIRATORY_TRACT | Status: AC
  Administered 2015-04-29: 10 mg/h via RESPIRATORY_TRACT
  Filled 2015-04-29: qty 20

## 2015-04-29 MED ORDER — PREDNISONE 20 MG PO TABS: 40.0000 mg | ORAL_TABLET | Freq: Every day | ORAL | Status: DC

## 2015-04-29 NOTE — ED Notes (Signed)
Dr.Walden at the bedside.

## 2015-04-29 NOTE — ED Notes (Signed)
Notified respiratory therapist of continuous neb treatment

## 2015-04-29 NOTE — Discharge Instructions (Signed)
Chronic Obstructive Pulmonary Disease Exacerbation Chronic obstructive pulmonary disease (COPD) is a common lung condition in which airflow from the lungs is limited. COPD is a general term that can be used to describe many different lung problems that limit airflow, including chronic bronchitis and emphysema. COPD exacerbations are episodes when breathing symptoms become much worse and require extra treatment. Without treatment, COPD exacerbations can be life threatening, and frequent COPD exacerbations can cause further damage to your lungs. CAUSES   Respiratory infections.   Exposure to smoke.   Exposure to air pollution, chemical fumes, or dust. Sometimes there is no apparent cause or trigger. RISK FACTORS  Smoking cigarettes.  Older age.  Frequent prior COPD exacerbations. SIGNS AND SYMPTOMS   Increased coughing.   Increased thick spit (sputum) production.   Increased wheezing.   Increased shortness of breath.   Rapid breathing.   Chest tightness. DIAGNOSIS  Your medical history, a physical exam, and tests will help your health care provider make a diagnosis. Tests may include:  A chest X-ray.  Basic lab tests.  Sputum testing.  An arterial blood gas test. TREATMENT  Depending on the severity of your COPD exacerbation, you may need to be admitted to a hospital for treatment. Some of the treatments commonly used to treat COPD exacerbations are:   Antibiotic medicines.   Bronchodilators. These are drugs that expand the air passages. They may be given with an inhaler or nebulizer. Spacer devices may be needed to help improve drug delivery.  Corticosteroid medicines.  Supplemental oxygen therapy.  HOME CARE INSTRUCTIONS   Do not smoke. Quitting smoking is very important to prevent COPD from getting worse and exacerbations from happening as often.  Avoid exposure to all substances that irritate the airway, especially to tobacco smoke.   If you were  prescribed an antibiotic medicine, finish it all even if you start to feel better.  Take all medicines as directed by your health care provider.It is important to use correct technique with inhaled medicines.  Drink enough fluids to keep your urine clear or pale yellow (unless you have a medical condition that requires fluid restriction).  Use a cool mist vaporizer. This makes it easier to clear your chest when you cough.   If you have a home nebulizer and oxygen, continue to use them as directed.   Maintain all necessary vaccinations to prevent infections.   Exercise regularly.   Eat a healthy diet.   Keep all follow-up appointments as directed by your health care provider. SEEK IMMEDIATE MEDICAL CARE IF:  You have worsening shortness of breath.   You have trouble talking.   You have severe chest pain.  You have blood in your sputum.  You have a fever.  You have weakness, vomit repeatedly, or faint.   You feel confused.   You continue to get worse. MAKE SURE YOU:   Understand these instructions.  Will watch your condition.  Will get help right away if you are not doing well or get worse. Document Released: 07/07/2007 Document Revised: 01/24/2014 Document Reviewed: 05/14/2013 [facility] Patient Information 2015 [place], [place]. This information is not intended to replace advice given to you by your health care provider. Make sure you discuss any questions you have with your health care provider.

## 2015-04-29 NOTE — ED Notes (Signed)
Explained to patient plan of care, prepared for xray transport.

## 2015-04-29 NOTE — ED Provider Notes (Signed)
CSN: [id number]     Arrival date & time 04/29/15  2023 History   First MD Initiated Contact with Patient 04/29/15 2053     Chief Complaint  Patient presents with  . Shortness of Breath     (Consider location/radiation/quality/duration/timing/severity/associated sxs/prior Treatment) HPI Comments: Did not try her inhaler or any of her COPD meds.  Patient is a 59 y.o. female presenting with shortness of breath. The history is provided by the patient.  Shortness of Breath Severity:  Moderate Onset quality:  Gradual Timing:  Constant Progression:  Unchanged Chronicity:  Recurrent Context: activity and weather changes (extremely hot and humid)   Relieved by:  Nothing Worsened by:  Nothing tried Associated symptoms: cough   Associated symptoms: no abdominal pain, no chest pain, no fever and no vomiting     Past Medical History  Diagnosis Date  . Personal history of chemotherapy 1997  . GERD (gastroesophageal reflux disease) 02/03/2012  . DDD (degenerative disc disease), cervical   . Full dentures   . Exertional shortness of breath     states if she "takes her time" doing activities does not get SOB  . COPD (chronic obstructive pulmonary disease)     no home O2  . History of breast cancer   . High cholesterol     no current med.  . Family history of adverse reaction to anesthesia     pt's sister has hx. of post-op N/V  . Cataract, immature     bilateral  . Cough 02/09/2015  . Runny nose 02/09/2015    clear drainage, per pt.   Past Surgical History  Procedure Laterality Date  . Tubal ligation  1980's  . Colonoscopy    . Mastectomy, partial Right 1997    -node dissection  . Breast capsulotomy with implant exchange Right 06/21/2013    Procedure: REVISION RIGHT BREAST RECONSTRUCTION/REMOVAL OF RIGHT IMPLANT/RIGHT BREAST CAPSULOTOMY WITH INSERT TISSUE EXPLANDER RIGHT BREAST/POSSIBLE LEFT BREAST MASTOPEXY;  Surgeon: Cristine Polio, MD;  Location: [facility];   Service: Plastics;  Laterality: Right;  . Tissue expander placement Right 03/14/2014    Procedure: SALINE REMOVAL RIGHT TISSUE EXPANDER;  Surgeon: Cristine Polio, MD;  Location: [place];  Service: Plastics;  Laterality: Right;  . Breast surgery  97    implant  . Breast reconstruction Right 06/21/2013    Procedure: BREAST RECONSTRUCTION;  Surgeon: Cristine Polio, MD;  Location: [place];  Service: Plastics;  Laterality: Right;  . Breast reconstruction Left 11/29/2013    Procedure: LEFT MASTOPEXY FOR RECONSTRUCTION;  Surgeon: Cristine Polio, MD;  Location: [place];  Service: Plastics;  Laterality: Left;  . Breast reconstruction Right 03/14/2014    Procedure: RECONSTRUCTION NIPPLE RIGHT BREAST;  Surgeon: Cristine Polio, MD;  Location: [place];  Service: Plastics;  Laterality: Right;  . Scar revision Right 02/13/2015    Procedure: SCAR REVISION RIGHT BREAST;  Surgeon: Cristine Polio, MD;  Location: [place];  Service: Plastics;  Laterality: Right;  . Removal of tissue expander and placement of implant Right 02/13/2015    Procedure: REMOVAL OF TISSUE  EXPANDER  PORT RIGHT BREAST;  Surgeon: Cristine Polio, MD;  Location: [place];  Service: Plastics;  Laterality: Right;   Family History  Problem Relation Age of Onset  . Hypertension Sister   . Anesthesia problems Sister     post-op N/V  . Hypertension Mother   . Heart disease Brother    History  Substance Use Topics  . Smoking status: Former Smoker    Quit date: 07/23/2012  . Smokeless tobacco: Never Used  . Alcohol Use: No   OB History    No data available     Review of Systems  Constitutional: Negative for fever.  Respiratory: Positive for cough and shortness of breath.   Cardiovascular: Negative for chest pain and leg swelling.  Gastrointestinal: Negative for vomiting and abdominal pain.  All other systems reviewed and  are negative.     Allergies  Review of patient's allergies indicates no known allergies.  Home Medications   Prior to Admission medications   Medication Sig Start Date End Date Taking? Authorizing Provider  albuterol (PROVENTIL HFA;VENTOLIN HFA) 108 (90 BASE) MCG/ACT inhaler Inhale 2 puffs into the lungs every 6 (six) hours as needed for wheezing. 10/14/14 12/18/15 Yes Rushil Sherrye Payor, MD  albuterol (PROVENTIL) (2.5 MG/3ML) 0.083% nebulizer solution Take 2.5 mg by nebulization every 6 (six) hours as needed for wheezing. For wheezing   Yes Historical Provider, MD  budesonide-formoterol (SYMBICORT) 160-4.5 MCG/ACT inhaler Inhale 2 puffs into the lungs 2 (two) times daily. 09/05/14  Yes Rushil Sherrye Payor, MD  famotidine (PEPCID) 10 MG tablet Take 10 mg by mouth at bedtime.   Yes Historical Provider, MD  gabapentin (NEURONTIN) 400 MG capsule TAKE ONE CAPSULE BY MOUTH THREE TIMES DAILY 02/02/15  Yes Rushil Sherrye Payor, MD  omeprazole (PRILOSEC) 20 MG capsule Take 20 mg by mouth 2 (two) times daily before a meal.   Yes Historical Provider, MD  RESTASIS 0.05 % ophthalmic emulsion Place into both eyes 2 (two) times daily. 11/09/14  Yes Historical Provider, MD  Tiotropium Bromide Monohydrate (SPIRIVA RESPIMAT) 2.5 MCG/ACT AERS 2 pffs each am Patient taking differently: Take 2 puffs by mouth every morning. 2 pffs each am 02/28/15  Yes Tanda Rockers, MD   BP 150/99 mmHg  Pulse 82  Temp(Src) 98.7 F (37.1 C) (Oral)  Resp 14  SpO2 98% Physical Exam  Constitutional: She is oriented to person, place, and time. She appears well-developed and well-nourished. No distress.  HENT:  Head: Normocephalic and atraumatic.  Mouth/Throat: Oropharynx is clear and moist.  Eyes: EOM are normal. Pupils are equal, round, and reactive to light.  Neck: Normal range of motion. Neck supple.  Cardiovascular: Normal rate and regular rhythm.  Exam reveals no friction rub.   No murmur heard. Pulmonary/Chest: She is in respiratory  distress (mild pursed lip breathing). She has decreased breath sounds (diffuse). She has wheezes (minimal, high pitched on R side). She has no rales. She exhibits no tenderness.  Abdominal: Soft. She exhibits no distension. There is no tenderness. There is no rebound.  Musculoskeletal: Normal range of motion. She exhibits edema.  Neurological: She is alert and oriented to person, place, and time.  Skin: She is not diaphoretic.  Nursing note and vitals reviewed.   ED Course  Procedures (including critical care time) Labs Review Labs Reviewed  CBC  BRAIN NATRIURETIC PEPTIDE  COMPREHENSIVE METABOLIC PANEL  I-STAT TROPOININ, ED    Imaging Review Dg Chest 2 View  04/29/2015   CLINICAL DATA:  Shortness of breath and leg swelling beginning yesterday. History of breast cancer, COPD.  EXAM: CHEST  2 VIEW  COMPARISON:  Chest radiograph June 06, 2014  FINDINGS: Mild chronic interstitial changes, increased lung volumes with flattened hemidiaphragms. Cardiomediastinal silhouette is unremarkable. No pleural effusion or focal consolidation. No pneumothorax. Surgical clips project in RIGHT axilla, RIGHT breast implant noted. Patient  is osteopenic without destructive bony lesions.  IMPRESSION: COPD, no superimposed acute cardiopulmonary process.   Electronically Signed   By: Elon Alas M.D.   On: 04/29/2015 23:05     EKG Interpretation   Date/Time:  Saturday April 29 2015 20:40:26 EDT Ventricular Rate:  78 PR Interval:  128 QRS Duration: 70 QT Interval:  392 QTC Calculation: 446 R Axis:   89 Text Interpretation:  Normal sinus rhythm Normal ECG no significant change  since Jan 2016 Confirmed by Regenia Skeeter  MD, Fellers ([id number]) on 04/29/2015 8:51:08  PM      MDM   Final diagnoses:  Chronic obstructive pulmonary disease with acute exacerbation    47F here with shortness of breath and foot swelling. Foot swelling bilateral, began earlier this evening. Was planning on going to the baseball  game, however was too short of breath and required a wheelchair to get around. Here has some mild pursed lip breathing and decreased air movement in all fields. Only a trace of foot edema. Will treat as a COPD exacerbation. Labs ok. CXR ok. Feeling much better after a breathing treatment. Stable for discharge.   Evelina Bucy, MD 04/29/15 [phone]

## 2015-04-29 NOTE — ED Notes (Signed)
Patient presents stating she noticed her legs swelling last night and today was very SOB  Upon arrival she was not moving mush air upon auscultation. Neb given in triage

## 2015-05-01 ENCOUNTER — Ambulatory Visit (HOSPITAL_COMMUNITY)
Admission: RE | Admit: 2015-05-01 | Discharge: 2015-05-01 | Disposition: A | Discharge status: Home / Self Care | Payer: Commercial Managed Care - HMO | Source: Ambulatory Visit | Attending: Internal Medicine | Admitting: Internal Medicine

## 2015-05-01 DIAGNOSIS — Z1231 Encounter for screening mammogram for malignant neoplasm of breast: Secondary | ICD-10-CM | POA: Insufficient documentation

## 2015-05-18 ENCOUNTER — Other Ambulatory Visit: Payer: Self-pay | Admitting: Internal Medicine

## 2015-05-19 NOTE — Telephone Encounter (Signed)
As this patient was seen by Dr. Melvyn Novas on 02/28/15 and deemed necessary for their treatment as documented in the EHR, I will refill this prescription for Symbicort

## 2015-06-02 ENCOUNTER — Ambulatory Visit (INDEPENDENT_AMBULATORY_CARE_PROVIDER_SITE_OTHER): Payer: Commercial Managed Care - HMO | Admitting: Internal Medicine

## 2015-06-02 ENCOUNTER — Encounter: Payer: Self-pay | Admitting: Internal Medicine

## 2015-06-02 VITALS — BP 123/70 | HR 77 | Temp 98.5°F | Ht 65.5 in | Wt 129.8 lb

## 2015-06-02 DIAGNOSIS — R252 Cramp and spasm: Secondary | ICD-10-CM

## 2015-06-02 DIAGNOSIS — Z23 Encounter for immunization: Secondary | ICD-10-CM | POA: Diagnosis not present

## 2015-06-02 DIAGNOSIS — R238 Other skin changes: Secondary | ICD-10-CM

## 2015-06-02 DIAGNOSIS — R233 Spontaneous ecchymoses: Secondary | ICD-10-CM

## 2015-06-02 LAB — APTT: APTT: 31 s (ref 24–37)

## 2015-06-02 LAB — PROTIME-INR
INR: 1.04 (ref 0.00–1.49)
Prothrombin Time: 13.8 seconds (ref 11.6–15.2)

## 2015-06-02 MED ORDER — CYCLOBENZAPRINE HCL 10 MG PO TABS: 10.0000 mg | ORAL_TABLET | Freq: Three times a day (TID) | ORAL | Status: DC | PRN

## 2015-06-02 NOTE — Assessment & Plan Note (Signed)
Flu Shot today

## 2015-06-02 NOTE — Patient Instructions (Signed)
Muscle Cramps and Spasms Muscle cramps and spasms occur when a muscle or muscles tighten and you have no control over this tightening (involuntary muscle contraction). They are a common problem and can develop in any muscle. The most common place is in the calf muscles of the leg. Both muscle cramps and muscle spasms are involuntary muscle contractions, but they also have differences:   Muscle cramps are sporadic and painful. They may last a few seconds to a quarter of an hour. Muscle cramps are often more forceful and last longer than muscle spasms.  Muscle spasms may or may not be painful. They may also last just a few seconds or much longer. CAUSES  It is uncommon for cramps or spasms to be due to a serious underlying problem. In many cases, the cause of cramps or spasms is unknown. Some common causes are:   Overexertion.   Overuse from repetitive motions (doing the same thing over and over).   Remaining in a certain position for a long period of time.   Improper preparation, form, or technique while performing a sport or activity.   Dehydration.   Injury.   Side effects of some medicines.   Abnormally low levels of the salts and ions in your blood (electrolytes), especially potassium and calcium. This could happen if you are taking water pills (diuretics) or you are pregnant.  Some underlying medical problems can make it more likely to develop cramps or spasms. These include, but are not limited to:   Diabetes.   Parkinson disease.   Hormone disorders, such as thyroid problems.   Alcohol abuse.   Diseases specific to muscles, joints, and bones.   Blood vessel disease where not enough blood is getting to the muscles.  HOME CARE INSTRUCTIONS   Stay well hydrated. Drink enough water and fluids to keep your urine clear or pale yellow.  It may be helpful to massage, stretch, and relax the affected muscle.  For tight or tense muscles, use a warm towel, heating  pad, or hot shower water directed to the affected area.  If you are sore or have pain after a cramp or spasm, applying ice to the affected area may relieve discomfort.  Put ice in a plastic bag.  Place a towel between your skin and the bag.  Leave the ice on for 15-20 minutes, 03-04 times a day.  Medicines used to treat a known cause of cramps or spasms may help reduce their frequency or severity. Only take over-the-counter or prescription medicines as directed by your caregiver. SEEK MEDICAL CARE IF:  Your cramps or spasms get more severe, more frequent, or do not improve over time.  MAKE SURE YOU:   Understand these instructions.  Will watch your condition.  Will get help right away if you are not doing well or get worse.

## 2015-06-02 NOTE — Progress Notes (Signed)
Patient ID: Mary Jenkins, female   DOB: Feb 14, 1956, 59 y.o.   MRN: [id number]   Subjective:   Patient ID: Mary Jenkins female   DOB: August 17, 1956 59 y.o.   MRN: [id number]  HPI: Mary Jenkins is a 59 y.o. with PMH listed below. Presented today with c/o easy bruising when she scratches her forearms, also says she has some swelling of her forearms, but not in any other part of her body. She also endorses occasional cramps in her fore arms, which prevents her from been able to write. Bruise does not itch. She denies bleeding from other body orifices, no dark stools, joint pain or swelling, no rash, no hair loss. She is post menopausal. She denies trauma.  Past Medical History  Diagnosis Date  . Personal history of chemotherapy 1997  . GERD (gastroesophageal reflux disease) 02/03/2012  . DDD (degenerative disc disease), cervical   . Full dentures   . Exertional shortness of breath     states if she "takes her time" doing activities does not get SOB  . COPD (chronic obstructive pulmonary disease)     no home O2  . History of breast cancer   . High cholesterol     no current med.  . Family history of adverse reaction to anesthesia     pt's sister has hx. of post-op N/V  . Cataract, immature     bilateral  . Cough 02/09/2015  . Runny nose 02/09/2015    clear drainage, per pt.   Current Outpatient Prescriptions  Medication Sig Dispense Refill  . albuterol (PROVENTIL HFA;VENTOLIN HFA) 108 (90 BASE) MCG/ACT inhaler Inhale 2 puffs into the lungs every 6 (six) hours as needed for wheezing. 3 Inhaler 12  . albuterol (PROVENTIL) (2.5 MG/3ML) 0.083% nebulizer solution Take 2.5 mg by nebulization every 6 (six) hours as needed for wheezing. For wheezing    . famotidine (PEPCID) 10 MG tablet Take 10 mg by mouth at bedtime.    . gabapentin (NEURONTIN) 400 MG capsule TAKE ONE CAPSULE BY MOUTH THREE TIMES DAILY 90 capsule 3  . omeprazole (PRILOSEC) 20 MG capsule Take 20 mg by mouth 2 (two) times  daily before a meal.    . predniSONE (DELTASONE) 20 MG tablet Take 2 tablets (40 mg total) by mouth daily. 8 tablet 0  . RESTASIS 0.05 % ophthalmic emulsion Place into both eyes 2 (two) times daily.    . SYMBICORT 160-4.5 MCG/ACT inhaler INHALE TWO PUFFS BY MOUTH TWICE DAILY 11 Inhaler 0  . Tiotropium Bromide Monohydrate (SPIRIVA RESPIMAT) 2.5 MCG/ACT AERS 2 pffs each am (Patient taking differently: Take 2 puffs by mouth every morning. 2 pffs each am) 1 Inhaler 11   No current facility-administered medications for this visit.   Family History  Problem Relation Age of Onset  . Hypertension Sister   . Anesthesia problems Sister     post-op N/V  . Hypertension Mother   . Heart disease Brother    Social History   Social History  . Marital Status: Single    Spouse Name: N/A  . Number of Children: N/A  . Years of Education: N/A   Social History Main Topics  . Smoking status: Former Smoker    Quit date: 07/23/2012  . Smokeless tobacco: Never Used  . Alcohol Use: No  . Drug Use: No  . Sexual Activity: Not Asked   Other Topics Concern  . None   Social History Narrative   Review of Systems: CONSTITUTIONAL- No Fever,  weightloss, or change in appetite. SKIN- No Rash, colour changes or itching. HEAD- No Headache or dizziness. RESPIRATORY- Baseline cough and SOB with activity inchanged. CARDIAC- No Palpitations, or chest pain. GI- No vomiting, diarrhoea, constipation, abd pain. URINARY- No Frequency,  or dysuria. NEUROLOGIC- No Numbness, syncope, seizures or burning.  Objective:  Physical Exam: Filed Vitals:   06/02/15 1409  BP: 123/70  Pulse: 77  Temp: 98.5 F (36.9 C)  TempSrc: Oral  Height: 5' 5.5" (1.664 m)  Weight: 129 lb 12.8 oz (58.877 kg)  SpO2: 94%   GENERAL- alert, co-operative, appears as stated age, not in any distress, breathing room air. HEENT- Atraumatic, normocephalic, PERRL, moist oral mucosa CARDIAC- RRR, no murmurs, rubs or gallops. RESP- Moving  equal volumes of air, and clear to auscultation bilaterally, no wheezes or crackles. ABDOMEN- Soft, nontender,  bowel sounds present. NEURO- Alert and oriented,  Gait- Normal. EXTREMITIES- pulse 2+, symmetric, no pedal edema, forearms- one slightly hyperpigmented patch on arm, not well demarcated, looks like a slight bruising from trauma, measuring ~2 by ~3cm, oval shape. SKIN- Warm, dry, No rash or lesion. PSYCH- Normal mood and affect, appropriate thought content and speech.  Assessment & Plan:   The patient's case and plan of care was discussed with attending physician, Dr. Evette Doffing.  Please see problem based charting for assessment and plan.

## 2015-06-02 NOTE — Assessment & Plan Note (Signed)
Apparently it appears this is the third time patient is presenting with complaints of a bruise. Bruises are only on her forearms, and a reaction to scratching, not  A related to light exposure. She also says her forearms are swollen today but they appear very normal. Bruise is faint. In [place] she complained of a left arm bruis which has mostly faded and very hard to appreciate today. She has no systemic symptoms that would support some alternate diagnosis other than trauma at this point. Bruise looks more like trauma, from direst increased force/pressure. Pt denies trauma or domestic abuse.  CBC checked 04/29/2015- Plt 251, and prior to that have WNL.  Pt also has very long nails, which she says she has been carrying that way for the past 3 years and recently had redone.  Plan- PT/PTT - BMET, MAg- as pt is complaining of cramping in her forearms. - Flexeril 72m TID As needed. - Also cautioned patient about her nails, and possibly that been the cause. - If this is a recurring issue, consider domestic abuse, social work consult if she is open to the idea.

## 2015-06-03 LAB — BMP8+ANION GAP
ANION GAP: 18 mmol/L (ref 10.0–18.0)
BUN/Creatinine Ratio: 10 (ref 9–23)
BUN: 10 mg/dL (ref 6–24)
CHLORIDE: 102 mmol/L (ref 97–108)
CO2: 24 mmol/L (ref 18–29)
Calcium: 9.4 mg/dL (ref 8.7–10.2)
Creatinine, Ser: 0.96 mg/dL (ref 0.57–1.00)
GFR calc Af Amer: 75 mL/min/{1.73_m2} (ref >59)
GFR, EST NON AFRICAN AMERICAN: 65 mL/min/{1.73_m2} (ref >59)
Glucose: 87 mg/dL (ref 65–99)
Potassium: 5.3 mmol/L — ABNORMAL HIGH (ref 3.5–5.2)
SODIUM: 144 mmol/L (ref 134–144)

## 2015-06-03 LAB — MAGNESIUM: MAGNESIUM: 2.4 mg/dL — AB (ref 1.6–2.3)

## 2015-06-05 NOTE — Progress Notes (Signed)
Internal Medicine Clinic Attending  Case discussed with Dr. Emokpae soon after the resident saw the patient.  We reviewed the resident's history and exam and pertinent patient test results.  I agree with the assessment, diagnosis, and plan of care documented in the resident's note. 

## 2015-06-12 ENCOUNTER — Telehealth: Payer: Self-pay | Admitting: Internal Medicine

## 2015-06-12 NOTE — Telephone Encounter (Signed)
Pt called requesting the nurse to call back. °

## 2015-06-12 NOTE — Telephone Encounter (Signed)
Returned call to patient. No answer.  Message left.

## 2015-06-14 NOTE — Telephone Encounter (Signed)
Talked with pt today and she is concerned with "Dry Mouth" for 3 weeks. There are no changes in medications or diet. She thinks it is her toothpaste.  I suggested that she change her toothpaste and if that does not help to call us back and she can be seen in clinic.

## 2015-06-19 ENCOUNTER — Other Ambulatory Visit: Payer: Self-pay | Admitting: Internal Medicine

## 2015-06-27 ENCOUNTER — Other Ambulatory Visit: Payer: Self-pay | Admitting: Neurosurgery

## 2015-06-27 DIAGNOSIS — M5412 Radiculopathy, cervical region: Secondary | ICD-10-CM

## 2015-06-30 ENCOUNTER — Ambulatory Visit
Admission: RE | Admit: 2015-06-30 | Discharge: 2015-06-30 | Disposition: A | Discharge status: Home / Self Care | Payer: Commercial Managed Care - HMO | Source: Ambulatory Visit | Attending: Neurosurgery | Admitting: Neurosurgery

## 2015-06-30 VITALS — BP 113/75 | HR 81

## 2015-06-30 DIAGNOSIS — M5412 Radiculopathy, cervical region: Secondary | ICD-10-CM

## 2015-06-30 MED ORDER — IOHEXOL 300 MG/ML  SOLN
10.0000 mL | Freq: Once | INTRAMUSCULAR | Status: DC | PRN
  Administered 2015-06-30: 10 mL via INTRATHECAL

## 2015-06-30 MED ORDER — ONDANSETRON HCL 4 MG/2ML IJ SOLN
4.0000 mg | Freq: Once | INTRAMUSCULAR | Status: AC
  Administered 2015-06-30: 4 mg via INTRAMUSCULAR

## 2015-06-30 MED ORDER — HYDROMORPHONE HCL 1 MG/ML IJ SOLN
1.0000 mg | Freq: Once | INTRAMUSCULAR | Status: AC
  Administered 2015-06-30: 1 mg via INTRAMUSCULAR

## 2015-06-30 MED ORDER — DIAZEPAM 5 MG PO TABS
5.0000 mg | ORAL_TABLET | Freq: Once | ORAL | Status: AC
  Administered 2015-06-30: 5 mg via ORAL

## 2015-06-30 MED ORDER — HYDROXYZINE HCL 25 MG PO TABS
25.0000 mg | ORAL_TABLET | Freq: Once | ORAL | Status: AC
  Administered 2015-06-30: 25 mg via ORAL

## 2015-06-30 NOTE — Discharge Instructions (Signed)

## 2015-07-05 ENCOUNTER — Other Ambulatory Visit: Payer: Commercial Managed Care - HMO

## 2015-07-14 ENCOUNTER — Other Ambulatory Visit: Payer: Self-pay | Admitting: Internal Medicine

## 2015-07-15 NOTE — Telephone Encounter (Signed)
As this patient was seen by me on 02/02/15 and deemed necessary for their treatment as documented in the EHR, I will refill this prescription for Neurontin though for a 30-day supply. It is unclear to me why she has not asked for a refill about a month and would like for her to follow-up with [place] to better characterize her neuropathy.

## 2015-07-26 ENCOUNTER — Other Ambulatory Visit: Payer: Self-pay | Admitting: Internal Medicine

## 2015-07-26 MED ORDER — ALBUTEROL SULFATE (2.5 MG/3ML) 0.083% IN NEBU: 2.5000 mg | INHALATION_SOLUTION | Freq: Four times a day (QID) | RESPIRATORY_TRACT | Status: DC | PRN

## 2015-07-26 MED ORDER — IPRATROPIUM BROMIDE 0.02 % IN SOLN: 0.5000 mg | Freq: Four times a day (QID) | RESPIRATORY_TRACT | Status: DC | PRN

## 2015-07-26 NOTE — Telephone Encounter (Signed)
As this patient was seen by Dr. Melvyn Novas on 02/28/15 and deemed necessary for their treatment as documented in the EHR, I will refill this prescription for albuterol and ipratropium nebulizer solution

## 2015-07-26 NOTE — Telephone Encounter (Signed)
Pt requesting albuterol nebulizer solution and ipratrotium bromide 0.02% solution to be filled @ [facility] on [address].

## 2015-07-29 ENCOUNTER — Encounter: Payer: Self-pay | Admitting: Internal Medicine

## 2015-07-29 DIAGNOSIS — M47812 Spondylosis without myelopathy or radiculopathy, cervical region: Secondary | ICD-10-CM

## 2015-07-29 NOTE — Progress Notes (Signed)
Patient ID: Mary Jenkins, female   DOB: [birth year], 59 y.o.   MRN: [id number]  I have reviewed the office notes from Dr. 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

## 2015-08-14 DIAGNOSIS — E538 Deficiency of other specified B group vitamins: Secondary | ICD-10-CM | POA: Diagnosis not present

## 2015-08-14 DIAGNOSIS — K219 Gastro-esophageal reflux disease without esophagitis: Secondary | ICD-10-CM | POA: Diagnosis not present

## 2015-09-12 ENCOUNTER — Encounter: Payer: Self-pay | Admitting: Obstetrics

## 2015-09-12 ENCOUNTER — Other Ambulatory Visit: Payer: Self-pay | Admitting: Internal Medicine

## 2015-09-12 ENCOUNTER — Ambulatory Visit (INDEPENDENT_AMBULATORY_CARE_PROVIDER_SITE_OTHER): Payer: Medicare Other | Admitting: Obstetrics

## 2015-09-12 VITALS — BP 111/79 | HR 80 | Temp 97.8°F | Ht 66.0 in | Wt 122.0 lb

## 2015-09-12 DIAGNOSIS — A6000 Herpesviral infection of urogenital system, unspecified: Secondary | ICD-10-CM | POA: Diagnosis not present

## 2015-09-12 DIAGNOSIS — R634 Abnormal weight loss: Secondary | ICD-10-CM

## 2015-09-12 DIAGNOSIS — Z0000 Encounter for general adult medical examination without abnormal findings: Secondary | ICD-10-CM | POA: Diagnosis not present

## 2015-09-12 DIAGNOSIS — N760 Acute vaginitis: Secondary | ICD-10-CM | POA: Diagnosis not present

## 2015-09-12 DIAGNOSIS — Z113 Encounter for screening for infections with a predominantly sexual mode of transmission: Secondary | ICD-10-CM | POA: Diagnosis not present

## 2015-09-12 LAB — CBC
HEMATOCRIT: 41.9 % (ref 36.0–46.0)
HEMOGLOBIN: 13.4 g/dL (ref 12.0–15.0)
MCH: 25.5 pg — AB (ref 26.0–34.0)
MCHC: 32 g/dL (ref 30.0–36.0)
MCV: 79.7 fL (ref 78.0–100.0)
MPV: 10 fL (ref 8.6–12.4)
Platelets: 295 10*3/uL (ref 150–400)
RBC: 5.26 MIL/uL — ABNORMAL HIGH (ref 3.87–5.11)
RDW: 18 % — AB (ref 11.5–15.5)
WBC: 7.1 10*3/uL (ref 4.0–10.5)

## 2015-09-12 LAB — COMPREHENSIVE METABOLIC PANEL
ALBUMIN: 4.4 g/dL (ref 3.6–5.1)
ALT: 16 U/L (ref 6–29)
AST: 17 U/L (ref 10–35)
Alkaline Phosphatase: 68 U/L (ref 33–130)
BILIRUBIN TOTAL: 0.5 mg/dL (ref 0.2–1.2)
BUN: 13 mg/dL (ref 7–25)
CHLORIDE: 103 mmol/L (ref 98–110)
CO2: 23 mmol/L (ref 20–31)
CREATININE: 1 mg/dL (ref 0.50–1.05)
Calcium: 9.2 mg/dL (ref 8.6–10.4)
GLUCOSE: 88 mg/dL (ref 65–99)
Potassium: 4.3 mmol/L (ref 3.5–5.3)
SODIUM: 140 mmol/L (ref 135–146)
Total Protein: 7.4 g/dL (ref 6.1–8.1)

## 2015-09-12 MED ORDER — BUDESONIDE-FORMOTEROL FUMARATE 160-4.5 MCG/ACT IN AERO: 2.0000 | INHALATION_SPRAY | Freq: Two times a day (BID) | RESPIRATORY_TRACT | Status: DC

## 2015-09-12 MED ORDER — VALACYCLOVIR HCL 1 G PO TABS: ORAL_TABLET | ORAL | Status: DC

## 2015-09-12 MED ORDER — VITAFOL ULTRA 29-0.6-0.4-200 MG PO CAPS
1.0000 | ORAL_CAPSULE | Freq: Every day | ORAL | Status: DC
Start: 2015-09-12 — End: 2016-11-15

## 2015-09-12 NOTE — Telephone Encounter (Signed)
Pt requesting Symbicort inhaler to be filled @ [facility] on [address]

## 2015-09-12 NOTE — Telephone Encounter (Addendum)
As this patient was seen by me on 11/15/14 and deemed necessary for their treatment as documented in the EHR, I will refill this prescription for Symbicort.

## 2015-09-13 ENCOUNTER — Encounter: Payer: Self-pay | Admitting: Obstetrics

## 2015-09-13 DIAGNOSIS — R634 Abnormal weight loss: Secondary | ICD-10-CM | POA: Diagnosis not present

## 2015-09-13 DIAGNOSIS — Z0000 Encounter for general adult medical examination without abnormal findings: Secondary | ICD-10-CM | POA: Diagnosis not present

## 2015-09-13 LAB — TSH: TSH: 0.944 u[IU]/mL (ref 0.350–4.500)

## 2015-09-13 NOTE — Progress Notes (Signed)
Patient ID: Mary Jenkins, female   DOB: February 26, 1956, 59 y.o.   MRN: [id number]  Chief Complaint  Patient presents with  . Gynecologic Exam    Patient was sexually active- without protection and needs to be check    HPI Mary Jenkins is a 59 y.o. female.  Sexually active without protection.  Suspect STD because of increased discharge.  HPI  Past Medical History  Diagnosis Date  . Personal history of chemotherapy 1997  . GERD (gastroesophageal reflux disease) 02/03/2012  . DDD (degenerative disc disease), cervical   . Full dentures   . Exertional shortness of breath     states if she "takes her time" doing activities does not get SOB  . COPD (chronic obstructive pulmonary disease) (HCC)     no home O2  . History of breast cancer   . High cholesterol     no current med.  . Family history of adverse reaction to anesthesia     pt's sister has hx. of post-op N/V  . Cataract, immature     bilateral  . Cough 02/09/2015  . Runny nose 02/09/2015    clear drainage, per pt.    Past Surgical History  Procedure Laterality Date  . Tubal ligation  1980's  . Colonoscopy    . Mastectomy, partial Right 1997    -node dissection  . Breast capsulotomy with implant exchange Right 06/21/2013    Procedure: REVISION RIGHT BREAST RECONSTRUCTION/REMOVAL OF RIGHT IMPLANT/RIGHT BREAST CAPSULOTOMY WITH INSERT TISSUE EXPLANDER RIGHT BREAST/POSSIBLE LEFT BREAST MASTOPEXY;  Surgeon: Cristine Polio, MD;  Location: [facility];  Service: Plastics;  Laterality: Right;  . Tissue expander placement Right 03/14/2014    Procedure: SALINE REMOVAL RIGHT TISSUE EXPANDER;  Surgeon: Cristine Polio, MD;  Location: [facility];  Service: Plastics;  Laterality: Right;  . Breast surgery  97    implant  . Breast reconstruction Right 06/21/2013    Procedure: BREAST RECONSTRUCTION;  Surgeon: Cristine Polio, MD;  Location: [facility];  Service: Plastics;  Laterality: Right;  .  Breast reconstruction Left 11/29/2013    Procedure: LEFT MASTOPEXY FOR RECONSTRUCTION;  Surgeon: Cristine Polio, MD;  Location: [facility];  Service: Plastics;  Laterality: Left;  . Breast reconstruction Right 03/14/2014    Procedure: RECONSTRUCTION NIPPLE RIGHT BREAST;  Surgeon: Cristine Polio, MD;  Location: [facility];  Service: Plastics;  Laterality: Right;  . Scar revision Right 02/13/2015    Procedure: SCAR REVISION RIGHT BREAST;  Surgeon: Cristine Polio, MD;  Location: [facility];  Service: Plastics;  Laterality: Right;  . Removal of tissue expander and placement of implant Right 02/13/2015    Procedure: REMOVAL OF TISSUE  EXPANDER  PORT RIGHT BREAST;  Surgeon: Cristine Polio, MD;  Location: [facility];  Service: Plastics;  Laterality: Right;    Family History  Problem Relation Age of Onset  . Hypertension Sister   . Anesthesia problems Sister     post-op N/V  . Hypertension Mother   . Heart disease Brother     Social History Social History  Substance Use Topics  . Smoking status: Former Smoker    Quit date: 07/23/2012  . Smokeless tobacco: Never Used  . Alcohol Use: No    No Known Allergies  Current Outpatient Prescriptions  Medication Sig Dispense Refill  . albuterol (PROVENTIL HFA;VENTOLIN HFA) 108 (90 BASE) MCG/ACT inhaler Inhale 2 puffs into the lungs every 6 (six) hours as needed for  wheezing. 3 Inhaler 12  . albuterol (PROVENTIL) (2.5 MG/3ML) 0.083% nebulizer solution Take 3 mLs (2.5 mg total) by nebulization every 6 (six) hours as needed for wheezing. For wheezing 1620 mL 3  . famotidine (PEPCID) 10 MG tablet Take 10 mg by mouth at bedtime.    . gabapentin (NEURONTIN) 400 MG capsule TAKE ONE CAPSULE BY MOUTH THREE TIMES DAILY 90 capsule 0  . ipratropium (ATROVENT) 0.02 % nebulizer solution Take 2.5 mLs (0.5 mg total) by nebulization every 6 (six) hours as needed for wheezing or shortness of breath. 75 mL  6  . omeprazole (PRILOSEC) 20 MG capsule Take 20 mg by mouth 2 (two) times daily before a meal.    . RESTASIS 0.05 % ophthalmic emulsion Place into both eyes 2 (two) times daily.    . Tiotropium Bromide Monohydrate (SPIRIVA RESPIMAT) 2.5 MCG/ACT AERS 2 pffs each am (Patient taking differently: Take 2 puffs by mouth every morning. 2 pffs each am) 1 Inhaler 11  . vitamin B-12 (CYANOCOBALAMIN) 1000 MCG tablet Take 1,000 mcg by mouth daily.    . budesonide-formoterol (SYMBICORT) 160-4.5 MCG/ACT inhaler Inhale 2 puffs into the lungs 2 (two) times daily. 3 Inhaler 11  . cyclobenzaprine (FLEXERIL) 10 MG tablet Take 1 tablet (10 mg total) by mouth 3 (three) times daily as needed for muscle spasms. (Patient not taking: Reported on 09/12/2015) 30 tablet 0  . predniSONE (DELTASONE) 20 MG tablet Take 2 tablets (40 mg total) by mouth daily. (Patient not taking: Reported on 09/12/2015) 8 tablet 0  . Prenat-Fe Poly-Methfol-FA-DHA (VITAFOL ULTRA) 29-0.6-0.4-200 MG CAPS Take 1 capsule by mouth daily before breakfast. 30 capsule 11  . valACYclovir (VALTREX) 1000 MG tablet Take 1 tablet po bid x 3 days prn. 30 tablet prn   No current facility-administered medications for this visit.    Review of Systems Review of Systems Constitutional: positive for fatigue and weight loss Respiratory: negative for cough and wheezing Cardiovascular: negative for chest pain, fatigue and palpitations Gastrointestinal: negative for abdominal pain and change in bowel habits Genitourinary: positive for vaginal discharge Integument/breast: negative for nipple discharge Musculoskeletal:negative for myalgias Neurological: negative for gait problems and tremors Behavioral/Psych: negative for abusive relationship, depression Endocrine: negative for temperature intolerance     Blood pressure 111/79, pulse 80, temperature 97.8 F (36.6 C), height 5' 6" (1.676 m), weight 122 lb (55.339 kg).  Physical Exam Physical Exam            General:  Alert and no distress Abdomen:  normal findings: no organomegaly, soft, non-tender and no hernia  Pelvis:  External genitalia: normal general appearance Urinary system: urethral meatus normal and bladder without fullness, nontender Vaginal: normal without tenderness, induration or masses Cervix: normal appearance Adnexa: normal bimanual exam Uterus: anteverted and non-tender, normal size      Data Reviewed Labs  Assessment     Vaginal discharge after unprotected intercourse. Std Screen  Poor weight gain    Plan    Wet prep and cultures sent STD Panel oedered General Health Panel ordered  Orders Placed This Encounter  Procedures  . SureSwab, Vaginosis/Vaginitis Plus  . Comprehensive metabolic panel  . TSH  . CBC   Meds ordered this encounter  Medications  . vitamin B-12 (CYANOCOBALAMIN) 1000 MCG tablet    Sig: Take 1,000 mcg by mouth daily.  . Prenat-Fe Poly-Methfol-FA-DHA (VITAFOL ULTRA) 29-0.6-0.4-200 MG CAPS    Sig: Take 1 capsule by mouth daily before breakfast.    Dispense:  30 capsule  Refill:  11  . valACYclovir (VALTREX) 1000 MG tablet    Sig: Take 1 tablet po bid x 3 days prn.    Dispense:  30 tablet    Refill:  prn

## 2015-09-16 LAB — SURESWAB, VAGINOSIS/VAGINITIS PLUS
ATOPOBIUM VAGINAE: 7.1 Log (cells/mL)
C. ALBICANS, DNA: NOT DETECTED
C. GLABRATA, DNA: NOT DETECTED
C. PARAPSILOSIS, DNA: NOT DETECTED
C. TRACHOMATIS RNA, TMA: NOT DETECTED
C. TROPICALIS, DNA: NOT DETECTED
Gardnerella vaginalis: >8 Log (cells/mL)
LACTOBACILLUS SPECIES: NOT DETECTED Log (cells/mL)
MEGASPHAERA SPECIES: NOT DETECTED Log (cells/mL)
N. GONORRHOEAE RNA, TMA: NOT DETECTED
T. vaginalis RNA, QL TMA: DETECTED — AB

## 2015-09-19 ENCOUNTER — Other Ambulatory Visit: Payer: Self-pay | Admitting: *Deleted

## 2015-09-19 DIAGNOSIS — A599 Trichomoniasis, unspecified: Secondary | ICD-10-CM

## 2015-09-19 DIAGNOSIS — B9689 Other specified bacterial agents as the cause of diseases classified elsewhere: Secondary | ICD-10-CM

## 2015-09-19 DIAGNOSIS — N760 Acute vaginitis: Secondary | ICD-10-CM

## 2015-09-19 MED ORDER — METRONIDAZOLE 500 MG PO TABS: 500.0000 mg | ORAL_TABLET | Freq: Once | ORAL | Status: DC

## 2015-09-19 MED ORDER — METRONIDAZOLE 0.75 % VA GEL: 1.0000 | Freq: Every day | VAGINAL | Status: DC

## 2015-09-29 ENCOUNTER — Encounter: Payer: Self-pay | Admitting: Internal Medicine

## 2015-09-29 ENCOUNTER — Ambulatory Visit (INDEPENDENT_AMBULATORY_CARE_PROVIDER_SITE_OTHER): Payer: Medicare Other | Admitting: Internal Medicine

## 2015-09-29 VITALS — BP 104/73 | HR 94 | Temp 97.7°F | Wt 122.8 lb

## 2015-09-29 DIAGNOSIS — K219 Gastro-esophageal reflux disease without esophagitis: Secondary | ICD-10-CM

## 2015-09-29 DIAGNOSIS — Z8742 Personal history of other diseases of the female genital tract: Secondary | ICD-10-CM | POA: Diagnosis not present

## 2015-09-29 DIAGNOSIS — M47812 Spondylosis without myelopathy or radiculopathy, cervical region: Secondary | ICD-10-CM

## 2015-09-29 DIAGNOSIS — Z7951 Long term (current) use of inhaled steroids: Secondary | ICD-10-CM | POA: Diagnosis not present

## 2015-09-29 DIAGNOSIS — G629 Polyneuropathy, unspecified: Secondary | ICD-10-CM | POA: Diagnosis not present

## 2015-09-29 DIAGNOSIS — J449 Chronic obstructive pulmonary disease, unspecified: Secondary | ICD-10-CM

## 2015-09-29 DIAGNOSIS — M4692 Unspecified inflammatory spondylopathy, cervical region: Secondary | ICD-10-CM

## 2015-09-29 DIAGNOSIS — Z8619 Personal history of other infectious and parasitic diseases: Secondary | ICD-10-CM

## 2015-09-29 MED ORDER — TIOTROPIUM BROMIDE MONOHYDRATE 2.5 MCG/ACT IN AERS: INHALATION_SPRAY | RESPIRATORY_TRACT | Status: DC

## 2015-09-29 MED ORDER — GABAPENTIN 400 MG PO CAPS: 800.0000 mg | ORAL_CAPSULE | Freq: Three times a day (TID) | ORAL | Status: DC

## 2015-09-29 NOTE — Progress Notes (Signed)
   Subjective:    Patient ID: Mary Jenkins, female    DOB: 1955/11/30, 60 y.o.   MRN: [id number]  HPI Mary Jenkins is a 60 year old female with COPD Gold stage IV, GERD, history of breast cancer who presents today for shoulder pain. Please see assessment & plan for status of chronic medical problems.   Review of Systems  Respiratory: Negative for shortness of breath.   Cardiovascular: Negative for chest pain and leg swelling.  Gastrointestinal: Negative for nausea, vomiting, abdominal pain and diarrhea.  Genitourinary: Negative for dysuria and vaginal discharge.  Neurological: Negative for dizziness.       Objective:   Physical Exam  Constitutional: She is oriented to person, place, and time. She appears well-developed and well-nourished. No distress.  HENT:  Head: Normocephalic and atraumatic.  Mouth/Throat: Oropharynx is clear and moist. No oropharyngeal exudate.  Eyes: Conjunctivae are normal. Pupils are equal, round, and reactive to light.  Neck: No tracheal deviation present.  Cardiovascular: Normal rate and regular rhythm.  Exam reveals no gallop and no friction rub.   No murmur heard. Pulmonary/Chest: Effort normal and breath sounds normal. No stridor. No respiratory distress. She has no wheezes. She has no rales.  Abdominal: Soft. Bowel sounds are normal. She exhibits no distension. There is no tenderness. There is no rebound.  Neurological: She is alert and oriented to person, place, and time. No cranial nerve deficit.  Skin: Skin is warm and dry. She is not diaphoretic.  Psychiatric: She has a normal mood and affect. Her behavior is normal.          Assessment & Plan:

## 2015-09-29 NOTE — Patient Instructions (Addendum)
Thank you for bringing your medicines today. This helps [place] keep you safe from mistakes.  Try taking gabapentin 800 mg 3 times daily see if it improves her nerve pain. If it is still affecting your sleep, you can take Benadryl as needed.  For your heartburn, you can stop taking Pepcid to see how it affects your symptoms. When you take Prilosec, please make sure to take it at least 30 minutes before you eat [breakfast and dinner].   I look forward to seeing you back in the next 6-12 months. Please keep up the good work.    Progress Toward Treatment Goals:  No flowsheet data found.  Self Care Goals & Plans:  No flowsheet data found.  No flowsheet data found.   Care Management & Community Referrals:  No flowsheet data found.

## 2015-09-29 NOTE — Assessment & Plan Note (Signed)
Overview She reports no worsening of her underlying COPD in the last few visits and is currently on albuterol inhaler/nebs every 6 hours as needed, ipratropium nebs every 6 hours as needed, tiotropium 2 puffs in the morning, budesonide/formoterol 2 puffs twice daily. She would like a refill for her tiotropium.  Assessment COPD GOLD Stage IV, well controlled on current therapy.  Plan -Refill tiotropium inhaler -Continue other medications as noted above

## 2015-09-29 NOTE — Assessment & Plan Note (Signed)
Overview On 08/06/15, she had unprotected sex with a female and noted heavy malodorous vaginal discharge three days later. 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 She reports completing her treatment with Flagyl and has had no symptoms since. Other than sexual encounter back in July 2015, she denies other sexual encounters. She is curious why she was prescribed Valtrex and does report prior history of genital herpes.  Assessment Trichomonal vaginitis, resolved.   Plan -Advised her to be careful with whom she has encounters and to use protection -Advised her to use Valtrex if she has a herpes outbreak

## 2015-09-29 NOTE — Assessment & Plan Note (Addendum)
Overview She continues to report numbness/tingling that runs down her right arm. Symptoms persist even at rest and interfere with her ability to sleep. She is still wary of having surgery as she feels her outcome may not be great. She reports taking gabapentin 462m three times day which offersher some relief and would like for me to prescribe Advil PM since it helps with sleep.  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   Plan -Increased gabapentin to 8073mthree times day and reassess her response -Advised her to take OTC Benadryl if needed for sleep

## 2015-09-29 NOTE — Assessment & Plan Note (Signed)
Overview She reports taking Prilosec 55m twice daily - morning and bedtime. She also reports taking Pepicid 157mat bedtime. Her symptoms are mainly triggered with pizza and pasta - both of which contain tomato sauce. She recalls she was started on Pepcid by her pulmonologist though cannot recall when or why.   Assessment GERD, treated with duplicate therapy. Review of the chart confirms what she reported in an office visit dated June 2013 where it appeared she had poorly controlled symptoms on Prilosec alone though unclear if she was taking this medication appropriately as she reported today taking it at bedtime.   Plan -Advised her to stop taking Pepcid to see if her symptoms change to which she is agreeable -Advised her to take Prilosec 30 minutes before meals [breakfast and dinner]

## 2015-10-02 NOTE — Progress Notes (Signed)
Internal Medicine Clinic Attending  Case discussed with Dr. Charlott Rakes soon after the resident saw the patient.  We reviewed the resident's history and exam and pertinent patient test results.  I agree with the assessment, diagnosis, and plan of care documented in the resident's note.

## 2015-10-11 ENCOUNTER — Ambulatory Visit (INDEPENDENT_AMBULATORY_CARE_PROVIDER_SITE_OTHER): Payer: Medicare Other | Admitting: Internal Medicine

## 2015-10-11 ENCOUNTER — Encounter: Payer: Self-pay | Admitting: Internal Medicine

## 2015-10-11 VITALS — BP 117/74 | HR 94 | Temp 98.0°F | Ht 65.5 in | Wt 121.0 lb

## 2015-10-11 DIAGNOSIS — K121 Other forms of stomatitis: Secondary | ICD-10-CM

## 2015-10-11 DIAGNOSIS — Z87891 Personal history of nicotine dependence: Secondary | ICD-10-CM

## 2015-10-11 DIAGNOSIS — K08109 Complete loss of teeth, unspecified cause, unspecified class: Secondary | ICD-10-CM

## 2015-10-11 MED ORDER — NYSTATIN 100000 UNIT/ML MT SUSP: 5.0000 mL | Freq: Four times a day (QID) | OROMUCOSAL | Status: DC

## 2015-10-11 NOTE — Progress Notes (Signed)
[facility] Subjective:   Patient ID: Mary Jenkins female   DOB: 11/02/1955 60 y.o.   MRN: [id number]  HPI: Mary Jenkins is a 60 y.o. female with a PMH detailed below who presents for an acute visit for mouth pain.  She reports pain/ possible ulcer at her lower gum line.  She feels like she has thrush which she has had before.  She has been using symbicort and does not always rinse her mouth following administration.  She denies any dysphasia.    Past Medical History  Diagnosis Date  . Personal history of chemotherapy 1997  . GERD (gastroesophageal reflux disease) 02/03/2012  . DDD (degenerative disc disease), cervical   . Full dentures   . Exertional shortness of breath     states if she "takes her time" doing activities does not get SOB  . COPD (chronic obstructive pulmonary disease) (HCC)     no home O2  . History of breast cancer   . High cholesterol     no current med.  . Family history of adverse reaction to anesthesia     pt's sister has hx. of post-op N/V  . Cataract, immature     bilateral  . Cough 02/09/2015  . Runny nose 02/09/2015    clear drainage, per pt.   Current Outpatient Prescriptions  Medication Sig Dispense Refill  . albuterol (PROVENTIL HFA;VENTOLIN HFA) 108 (90 BASE) MCG/ACT inhaler Inhale 2 puffs into the lungs every 6 (six) hours as needed for wheezing. 3 Inhaler 12  . albuterol (PROVENTIL) (2.5 MG/3ML) 0.083% nebulizer solution Take 3 mLs (2.5 mg total) by nebulization every 6 (six) hours as needed for wheezing. For wheezing 1620 mL 3  . budesonide-formoterol (SYMBICORT) 160-4.5 MCG/ACT inhaler Inhale 2 puffs into the lungs 2 (two) times daily. 3 Inhaler 11  . famotidine (PEPCID) 20 MG tablet Take 20 mg by mouth at bedtime.    . gabapentin (NEURONTIN) 400 MG capsule Take 2 capsules (800 mg total) by mouth 3 (three) times daily. 180 capsule 0  . ipratropium (ATROVENT) 0.02 % nebulizer solution Take 2.5 mLs (0.5 mg total) by  nebulization every 6 (six) hours as needed for wheezing or shortness of breath. 75 mL 6  . omeprazole (PRILOSEC) 20 MG capsule Take 20 mg by mouth 2 (two) times daily before a meal.    . Prenat-Fe Poly-Methfol-FA-DHA (VITAFOL ULTRA) 29-0.6-0.4-200 MG CAPS Take 1 capsule by mouth daily before breakfast. 30 capsule 11  . RESTASIS 0.05 % ophthalmic emulsion Place into both eyes 2 (two) times daily.    . Tiotropium Bromide Monohydrate (SPIRIVA RESPIMAT) 2.5 MCG/ACT AERS 2 puffs each am 1 Inhaler 11  . valACYclovir (VALTREX) 1000 MG tablet Take 1 tablet po bid x 3 days prn. (Patient not taking: Reported on 09/29/2015) 30 tablet prn  . vitamin B-12 (CYANOCOBALAMIN) 1000 MCG tablet Take 1,000 mcg by mouth daily.     No current facility-administered medications for this visit.   Family History  Problem Relation Age of Onset  . Hypertension Sister   . Anesthesia problems Sister     post-op N/V  . Hypertension Mother   . Heart disease Brother    Social History   Social History  . Marital Status: Single    Spouse Name: N/A  . Number of Children: N/A  . Years of Education: N/A   Social History Main Topics  . Smoking status: Former Smoker    Quit date: 07/23/2012  . Smokeless tobacco:  Never Used  . Alcohol Use: No  . Drug Use: No  . Sexual Activity:    Partners: Male    Birth Control/ Protection: Post-menopausal   Other Topics Concern  . None   Social History Narrative   Review of Systems: Review of Systems  Constitutional: Negative for fever and chills.  HENT: Negative for sore throat.   Respiratory: Negative for cough, shortness of breath and wheezing.      Objective:  Physical Exam: Filed Vitals:   10/11/15 1545  BP: 117/74  Pulse: 94  Temp: 98 F (36.7 C)  TempSrc: Oral  Height: 5' 5.5" (1.664 m)  Weight: 121 lb (54.885 kg)  SpO2: 93%  Physical Exam  HENT:  Has upper and lower dentures.  There is one area of tenderness in the anterior portion of her lower jaw without  a clear ulcer.  I did not see any white patches or signs of thrush.  Pulmonary/Chest: Effort normal and breath sounds normal. She has no wheezes.  Nursing note and vitals reviewed.   Assessment & Plan:  Case discussed with Dr. Eppie Gibson  Oral ulcer She appears to be developing an ulcer below her lower denture line.  I do not see clear signs of thrush today but she has had thrush before and feels this is similar.   - Will Rx Nystatin swish and swallow for possible thrush - Recommend oragel OTC for sore area - Recommend keeping dentures out unless eating until area improves.    Medications Ordered Meds ordered this encounter  Medications  . nystatin (MYCOSTATIN) 100000 UNIT/ML suspension    Sig: Take 5 mLs (500,000 Units total) by mouth 4 (four) times daily.    Dispense:  120 mL    Refill:  0   Other Orders No orders of the defined types were placed in this encounter.   Follow Up: Return if symptoms worsen or fail to improve.

## 2015-10-11 NOTE — Patient Instructions (Signed)
I want you to pick up OTC Oragel to numb the sore area on your mouth. Remember to rinse with water after symbicort.  Nystatin oral suspension What is this medicine? NYSTATIN (nye STAT in) is an antifungal medicine. It is used to treat certain kinds of fungal or yeast infections. This medicine may be used for other purposes; ask your health care provider or pharmacist if you have questions. What should I tell my health care provider before I take this medicine? They need to know if you have any of these conditions: -diabetes -kidney disease -an unusual or allergic reaction to nystatin, ethylenediamine, parabens, thimerosal, other foods, dyes or preservatives -pregnant or trying to get pregnant -breast-feeding How should I use this medicine? Follow the directions on the prescription label. Shake well before using. Use a specially marked dropper to measure every dose. Ask your pharmacist if you do not have one. Put one half of the dose in each side of your mouth. Swish the medicine around in your mouth and gargle. Hold your dose in your mouth for as long as you can. Swallow or spit out as directed by your doctor. Take your medicine at regular intervals. Do not take your medicine more often than directed. Do not skip doses or stop your medicine early even if you feel better. Do not stop taking except on your doctor's advice. Talk to your pediatrician regarding the use of this medicine in children. Special care may be needed. Overdosage: If you think you have taken too much of this medicine contact a poison control center or emergency room at once. NOTE: This medicine is only for you. Do not share this medicine with others. What if I miss a dose? If you miss a dose, take it as soon as you can. If it is almost time for your next dose, take only that dose. Do not take double or extra doses. What may interact with this medicine? Interactions are not expected. This list may not describe all possible  interactions. Give your health care provider a list of all the medicines, herbs, non-prescription drugs, or dietary supplements you use. Also tell them if you smoke, drink alcohol, or use illegal drugs. Some items may interact with your medicine. What should I watch for while using this medicine? Tell your doctor or health care professional if your symptoms do not improve or get worse. If you wear dentures talk to your doctor about how to clean them. What side effects may I notice from receiving this medicine? Side effects that you should report to your doctor or health care professional as soon as possible: -allergic reactions like skin rash, itching or hives, swelling of the face, lips, or tongue -fast heart beat -redness, blistering, peeling or loosening of the skin, including inside the mouth -trouble breathing Side effects that usually do not require medical attention (report to your doctor or health care professional if they continue or are bothersome): -diarrhea -muscle aches or pains -nausea, vomiting -stomach upset This list may not describe all possible side effects. Call your doctor for medical advice about side effects. You may report side effects to FDA at 1-800-FDA-1088. Where should I keep my medicine? Keep out of the reach of children. Store at room temperature between 15 and 25 degrees C (59 and 77 degrees F). Protect from light. Throw away any unused medicine after the expiration date. NOTE: This sheet is a summary. It may not cover all possible information. If you have questions about this medicine, talk to  your doctor, pharmacist, or health care provider.    2016, Elsevier/Gold Standard. (2008-10-11 13:21:00)

## 2015-10-13 NOTE — Assessment & Plan Note (Signed)
She appears to be developing an ulcer below her lower denture line.  I do not see clear signs of thrush today but she has had thrush before and feels this is similar.   - Will Rx Nystatin swish and swallow for possible thrush - Recommend oragel OTC for sore area - Recommend keeping dentures out unless eating until area improves.

## 2015-10-16 NOTE — Progress Notes (Signed)
Case discussed with Dr. Heber Bristow soon after the resident saw the patient.  We reviewed the resident's history and exam and pertinent patient test results.  I agree with the assessment, diagnosis, and plan of care documented in the resident's note.

## 2015-10-18 DIAGNOSIS — C50911 Malignant neoplasm of unspecified site of right female breast: Secondary | ICD-10-CM | POA: Diagnosis not present

## 2015-10-31 ENCOUNTER — Telehealth: Payer: Self-pay | Admitting: Internal Medicine

## 2015-10-31 MED ORDER — PREDNISONE 10 MG PO TABS: ORAL_TABLET | ORAL | Status: DC

## 2015-10-31 MED ORDER — AZITHROMYCIN 250 MG PO TABS: ORAL_TABLET | ORAL | Status: AC

## 2015-10-31 NOTE — Telephone Encounter (Signed)
Zpak/ Prednisone 10 mg take  4 each am x 2 days,   2 each am x 2 days,  1 each am x 2 days and stop  

## 2015-10-31 NOTE — Telephone Encounter (Signed)
Called spoke with patient who c/o prod cough with yellow mucus, tightness, some wheezing x2 days.  Felt feverish yesterday.  Denies any hemoptysis.  Nebs help minimally.  Has been taking Delsym with no relief.  [facility] on [address]. Dr Melvyn Novas please advise, thank you.  Last ov 6.7.16: Patient Instructions       Stop incruse and try spiriva respimat 2 pffs each am to see if makes any difference in your cough    If you are satisfied with your treatment plan,  let your doctor know and he/she can either refill your medications or you can return here when your prescription runs out.     If in any way you are not 100% satisfied,  please tell us.  If 100% better, tell your friends!  Pulmonary follow up is as needed

## 2015-10-31 NOTE — Telephone Encounter (Signed)
Spoke with pt. She is aware of MW's recommendations. Rxs have been sent in. Nothing further was needed.

## 2015-11-16 ENCOUNTER — Other Ambulatory Visit: Payer: Self-pay | Admitting: Internal Medicine

## 2015-11-16 NOTE — Telephone Encounter (Signed)
As this patient was seen by me on 09/29/15 and deemed necessary for their treatment as documented in the EHR, I will refill this prescription for Prilosec.

## 2015-11-29 DIAGNOSIS — H524 Presbyopia: Secondary | ICD-10-CM | POA: Diagnosis not present

## 2015-11-29 DIAGNOSIS — H52223 Regular astigmatism, bilateral: Secondary | ICD-10-CM | POA: Diagnosis not present

## 2015-11-29 DIAGNOSIS — H04123 Dry eye syndrome of bilateral lacrimal glands: Secondary | ICD-10-CM | POA: Diagnosis not present

## 2015-11-29 DIAGNOSIS — H5203 Hypermetropia, bilateral: Secondary | ICD-10-CM | POA: Diagnosis not present

## 2015-11-29 DIAGNOSIS — H2513 Age-related nuclear cataract, bilateral: Secondary | ICD-10-CM | POA: Diagnosis not present

## 2016-01-16 ENCOUNTER — Ambulatory Visit (INDEPENDENT_AMBULATORY_CARE_PROVIDER_SITE_OTHER)
Admission: RE | Admit: 2016-01-16 | Discharge: 2016-01-16 | Disposition: A | Discharge status: Home / Self Care | Payer: Medicare Other | Source: Ambulatory Visit | Attending: Internal Medicine | Admitting: Internal Medicine

## 2016-01-16 ENCOUNTER — Telehealth: Payer: Self-pay | Admitting: Internal Medicine

## 2016-01-16 ENCOUNTER — Ambulatory Visit (INDEPENDENT_AMBULATORY_CARE_PROVIDER_SITE_OTHER): Payer: Medicare Other | Admitting: Internal Medicine

## 2016-01-16 ENCOUNTER — Encounter: Payer: Self-pay | Admitting: Internal Medicine

## 2016-01-16 VITALS — BP 102/78 | HR 56 | Temp 98.8°F | Ht 65.5 in | Wt 114.8 lb

## 2016-01-16 DIAGNOSIS — J449 Chronic obstructive pulmonary disease, unspecified: Secondary | ICD-10-CM

## 2016-01-16 DIAGNOSIS — R05 Cough: Secondary | ICD-10-CM | POA: Diagnosis not present

## 2016-01-16 DIAGNOSIS — R0602 Shortness of breath: Secondary | ICD-10-CM | POA: Diagnosis not present

## 2016-01-16 MED ORDER — PREDNISONE 10 MG PO TABS: ORAL_TABLET | ORAL | Status: DC

## 2016-01-16 MED ORDER — TIOTROPIUM BROMIDE MONOHYDRATE 2.5 MCG/ACT IN AERS: INHALATION_SPRAY | RESPIRATORY_TRACT | Status: DC
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
## 2016-01-16 NOTE — Telephone Encounter (Signed)
Spoke with pt. She is having increased SOB. OV has been made for 4:30pm today with MW. Nothing further was needed.

## 2016-01-16 NOTE — Patient Instructions (Addendum)
Plan A = Automatic = symbicort 160 Take 2 puffs first thing in am and then another 2 puffs about 12 hours later.                                      spiriva 2 pffs each   Plan B = Backup Only use your albuterol as a rescue medication to be used if you can't catch your breath by resting or doing a relaxed purse lip breathing pattern.  - The less you use it, the better it will work when you need it. - Ok to use the inhaler up to 2 puffs  every 4 hours if you must but call for appointment if use goes up over your usual need - Don't leave home without it !!  (think of it like the spare tire for your car)   Plan C = Crisis - only use your albuterol nebulizer if you first try Plan B and it fails to help > ok to use the nebulizer up to every 4 hours but if start needing it regularly call for immediate appointment  Prednisone 10 mg take  4 each am x 2 days,   2 each am x 2 days,  1 each am x 2 days and stop     Please remember to go to the  x-ray department downstairs for your tests - we will call you with the results when they are available.  Please schedule a follow up office visit in 6 weeks, call sooner if needed

## 2016-01-17 ENCOUNTER — Telehealth: Payer: Self-pay | Admitting: *Deleted

## 2016-01-17 NOTE — Telephone Encounter (Signed)
-----  Message from Tanda Rockers, MD sent at 01/17/2016  9:05 AM EDT ----- Be sure she knows I called her in prednisone

## 2016-01-17 NOTE — Assessment & Plan Note (Signed)
-  FEV1 0.39 ( 16%) ratio 23 and 16% better p B2    - 03/13/2012  Inhaler technique 90% with dpi but only 50% with mdi > 50% 04/22/12   - Alpha one genotype 02/13/12 > MM   - 03/22/2013  PFT's 0.56 ( 24%) ratio 33 but 12% better improvement and DLCO 21 corrects to 28%    started Incruse 11/11/14 p spiriva off formulary> d/c 02/28/2015 due to cough  - changed incruse to spiriva respimat 02/28/2015 due to cough   - 01/16/2016  After extensive coaching HFA effectiveness =    90%   She has very severe dz with little reserve for any asthma component related to breathing irritants or missing meds   rec Prednisone 10 mg take  4 each am x 2 days,   2 each am x 2 days,  1 each am x 2 days and stop  I had an extended discussion with the patient reviewing all relevant studies completed to date and  lasting 15 to 20 minutes of a 25 minute visit    Each maintenance medication was reviewed in detail including most importantly the difference between maintenance and prns and under what circumstances the prns are to be triggered using an action plan format that is not reflected in the computer generated alphabetically organized AVS.    Please see instructions for details which were reviewed in writing and the patient given a copy highlighting the part that I personally wrote and discussed at today's ov.

## 2016-01-17 NOTE — Telephone Encounter (Signed)
LMOM for pt letting her know pred taper was sent to her pharm

## 2016-01-31 ENCOUNTER — Other Ambulatory Visit: Payer: Self-pay | Admitting: Internal Medicine

## 2016-02-02 NOTE — Telephone Encounter (Signed)
Can you ask why she is requesting this medication?

## 2016-02-05 ENCOUNTER — Other Ambulatory Visit: Payer: Self-pay

## 2016-02-05 MED ORDER — DIPHENHYDRAMINE HCL 25 MG PO TABS: 25.0000 mg | ORAL_TABLET | Freq: Every evening | ORAL | Status: DC | PRN

## 2016-02-05 MED ORDER — DIPHENHYDRAMINE HCL 25 MG PO TABS: 25.0000 mg | ORAL_TABLET | Freq: Four times a day (QID) | ORAL | Status: DC | PRN

## 2016-02-05 MED ORDER — MELOXICAM 10 MG PO CAPS: 10.0000 mg | ORAL_CAPSULE | Freq: Every day | ORAL | Status: DC

## 2016-02-05 NOTE — Telephone Encounter (Signed)
I spoke with her over the phone. She does not feel gabapentin offers her much relief. I am more inclined to try an NSAID, like meloxicam, per Up-To-Date recommendations for conservative therapy. She does report that her symptoms are now of an unusual distribution with contralateral involvement. I also prescribed her Benadryl 25 mg to be taken as needed at bedtime for sleep since she feels this pain is keeping her up awake at night.  She is agreeable to being seen tomorrow she'll be unavailable for later this week. I told her I would ask someone from the front desk to see if they can reschedule one of my appointments or be seen by one of my colleagues which she acknowledged agreement.

## 2016-02-05 NOTE — Telephone Encounter (Signed)
Pt requesting gabapentin to be filled @ [facility] on [address].

## 2016-02-05 NOTE — Telephone Encounter (Signed)
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

## 2016-02-06 MED ORDER — MELOXICAM 15 MG PO TABS: 15.0000 mg | ORAL_TABLET | Freq: Every day | ORAL | Status: DC

## 2016-02-06 NOTE — Telephone Encounter (Signed)
Dr patel, spoke w/ pharmacist about meloxicam, the generic only comes in 7.66m or 165m pt will need a new script sent for 1 of these, thanks

## 2016-02-06 NOTE — Telephone Encounter (Signed)
I made the change. Thank you.

## 2016-02-06 NOTE — Addendum Note (Signed)
Addended by: Riccardo Dubin on: 02/06/2016 11:51 AM   Modules accepted: Orders, Medications

## 2016-02-09 ENCOUNTER — Telehealth: Payer: Self-pay | Admitting: Internal Medicine

## 2016-02-09 NOTE — Telephone Encounter (Signed)
APT. REMINDER CALL, LMTCB °

## 2016-02-12 ENCOUNTER — Encounter: Payer: Self-pay | Admitting: Internal Medicine

## 2016-02-12 ENCOUNTER — Ambulatory Visit (INDEPENDENT_AMBULATORY_CARE_PROVIDER_SITE_OTHER): Payer: Medicare Other | Admitting: Internal Medicine

## 2016-02-12 VITALS — BP 104/69 | HR 83 | Temp 98.3°F | Ht 66.0 in | Wt 115.4 lb

## 2016-02-12 DIAGNOSIS — Z9011 Acquired absence of right breast and nipple: Secondary | ICD-10-CM

## 2016-02-12 DIAGNOSIS — M25512 Pain in left shoulder: Secondary | ICD-10-CM

## 2016-02-12 DIAGNOSIS — Z8639 Personal history of other endocrine, nutritional and metabolic disease: Secondary | ICD-10-CM

## 2016-02-12 DIAGNOSIS — M542 Cervicalgia: Secondary | ICD-10-CM | POA: Diagnosis present

## 2016-02-12 DIAGNOSIS — Z853 Personal history of malignant neoplasm of breast: Secondary | ICD-10-CM | POA: Diagnosis not present

## 2016-02-12 DIAGNOSIS — E875 Hyperkalemia: Secondary | ICD-10-CM | POA: Diagnosis not present

## 2016-02-12 DIAGNOSIS — M47812 Spondylosis without myelopathy or radiculopathy, cervical region: Secondary | ICD-10-CM

## 2016-02-12 DIAGNOSIS — M4692 Unspecified inflammatory spondylopathy, cervical region: Secondary | ICD-10-CM | POA: Diagnosis not present

## 2016-02-12 MED ORDER — IBUPROFEN 800 MG PO TABS
800.0000 mg | ORAL_TABLET | Freq: Three times a day (TID) | ORAL | Status: DC | PRN
Start: 2016-02-12 — End: 2016-04-05

## 2016-02-12 NOTE — Patient Instructions (Signed)
For the pain, try ibuprofen 886m three times daily as needed. Please take this medication with food. Do NOT take meloxicam with this medication.  We will work on getting you to physical therapy.   Please see me back in 2-3 weeks to see how we are doing.

## 2016-02-12 NOTE — Progress Notes (Signed)
   Subjective:    Patient ID: Mary Jenkins, female    DOB: 1956-07-20, 61 y.o.   MRN: [id number]  HPI [name] is a 60 year old female with COPD goal stage IV, history of breast cancer, GERD, cervical spine arthritis who presents today for left shoulder pain. Please see assessment & plan for status of chronic medical problems.      Review of Systems  Constitutional: Positive for activity change.  Musculoskeletal: Negative for myalgias.  Neurological: Positive for numbness. Negative for weakness.  Psychiatric/Behavioral: Negative for sleep disturbance.       Objective:   Physical Exam  Constitutional: She is oriented to person, place, and time. She appears well-developed and well-nourished. No distress.  HENT:  Head: Normocephalic and atraumatic.  Mouth/Throat: Oropharynx is clear and moist. No oropharyngeal exudate.  Eyes: Conjunctivae are normal. Pupils are equal, round, and reactive to light.  Neck: No tracheal deviation present.  Cardiovascular: Normal rate and regular rhythm.  Exam reveals no gallop and no friction rub.   No murmur heard. Pulmonary/Chest: Effort normal and breath sounds normal. No stridor. No respiratory distress. She has no wheezes. She has no rales.  Abdominal: Soft. Bowel sounds are normal. She exhibits no distension. There is no tenderness. There is no rebound.  Musculoskeletal:  Reproduction of symptoms with flexion of the neck towards the left side. No weakness noted with shoulder elevation. Active range of motion of the left shoulder intact with no reproduction of symptoms with internal and external rotation.  Neurological: She is alert and oriented to person, place, and time. No cranial nerve deficit.  Skin: Skin is warm and dry. She is not diaphoretic.          Assessment & Plan:

## 2016-02-12 NOTE — Addendum Note (Signed)
Addended by: Riccardo Dubin on: 02/12/2016 11:36 AM   Modules accepted: Level of Service

## 2016-02-12 NOTE — Assessment & Plan Note (Signed)
Check CBC and BMET for next visit.
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
## 2016-02-13 DIAGNOSIS — Z8639 Personal history of other endocrine, nutritional and metabolic disease: Secondary | ICD-10-CM | POA: Insufficient documentation

## 2016-02-13 LAB — BMP8+ANION GAP
Anion Gap: 17 mmol/L (ref 10.0–18.0)
BUN / CREAT RATIO: 11 (ref 9–23)
BUN: 10 mg/dL (ref 6–24)
CHLORIDE: 99 mmol/L (ref 96–106)
CO2: 23 mmol/L (ref 18–29)
Calcium: 9.5 mg/dL (ref 8.7–10.2)
Creatinine, Ser: 0.88 mg/dL (ref 0.57–1.00)
GFR calc Af Amer: 83 mL/min/{1.73_m2} (ref >59)
GFR calc non Af Amer: 72 mL/min/{1.73_m2} (ref >59)
GLUCOSE: 88 mg/dL (ref 65–99)
Potassium: 5.4 mmol/L — ABNORMAL HIGH (ref 3.5–5.2)
SODIUM: 139 mmol/L (ref 134–144)

## 2016-02-13 LAB — CBC
HEMATOCRIT: 42.9 % (ref 34.0–46.6)
Hemoglobin: 13.8 g/dL (ref 11.1–15.9)
MCH: 26.2 pg — ABNORMAL LOW (ref 26.6–33.0)
MCHC: 32.2 g/dL (ref 31.5–35.7)
MCV: 81 fL (ref 79–97)
PLATELETS: 366 10*3/uL (ref 150–379)
RBC: 5.27 x10E6/uL (ref 3.77–5.28)
RDW: 17.4 % — AB (ref 12.3–15.4)
WBC: 6 10*3/uL (ref 3.4–10.8)

## 2016-02-13 NOTE — Assessment & Plan Note (Signed)
ADDENDUM 02/13/2016  9:55 AM:  Potassium mildly elevated at 5.4, per review of the chart, this is happening at least once a year. I spoke with the patient and she denies any symptoms of palpitation or other cardiac symptoms. She did report eating watermelon today prior to her visit which she attributes in the past has caused her potassium to mildly elevated. I instructed her give [place] a call immediately if she were to experience cardiac symptoms to which she expressed understanding, and she agrees to increase water intake. Otherwise, we'll recheck blood work next visit.

## 2016-02-15 NOTE — Progress Notes (Signed)
Internal Medicine Clinic Attending  Case discussed with Dr. Charlott Rakes at the time of the visit.  We reviewed the resident's history and exam and pertinent patient test results.  I agree with the assessment, diagnosis, and plan of care documented in the resident's note.

## 2016-02-29 ENCOUNTER — Encounter: Payer: Self-pay | Admitting: Internal Medicine

## 2016-02-29 ENCOUNTER — Ambulatory Visit (INDEPENDENT_AMBULATORY_CARE_PROVIDER_SITE_OTHER): Payer: Medicare Other | Admitting: Internal Medicine

## 2016-02-29 VITALS — BP 122/72 | HR 85 | Ht 65.0 in | Wt 110.0 lb

## 2016-02-29 DIAGNOSIS — J449 Chronic obstructive pulmonary disease, unspecified: Secondary | ICD-10-CM

## 2016-02-29 MED ORDER — PREDNISONE 10 MG PO TABS: ORAL_TABLET | ORAL | Status: DC

## 2016-02-29 NOTE — Assessment & Plan Note (Signed)
PFT performed on 06/27/2011. Poor quality.    - FEV1 0.39 ( 16%) ratio 23 and 16% better p B2    - 03/13/2012  Inhaler technique 90% with dpi but only 50% with mdi > 50% 04/22/12   - Alpha one genotype 02/13/12 > MM   - 03/22/2013  PFT's 0.56 ( 24%) ratio 33 but 12% better improvement and DLCO 21 corrects to 28%    started Incruse 11/11/14 p spiriva off formulary> d/c 02/28/2015 due to cough  - changed incruse to spiriva respimat 02/28/2015 due to cough   - 02/29/2016  After extensive coaching HFA effectiveness =    90% but baseline < 50%   DDX of  difficult airways management almost all start with A and  include Adherence, Ace Inhibitors, Acid Reflux, Active Sinus Disease, Alpha 1 Antitripsin deficiency, Anxiety masquerading as Airways dz,  ABPA,  Allergy(esp in young), Aspiration (esp in elderly), Adverse effects of meds,  Active smokers, A bunch of PE's (a small clot burden can't cause this syndrome unless there is already severe underlying pulm or vascular dz with poor reserve) plus two Bs  = Bronchiectasis and Beta blocker use..and one C= CHF   In this case Adherence is the biggest issue and starts with  inability to use HFA effectively and also  understand that SABA treats the symptoms but doesn't get to the underlying problem (inflammation).  I used  the analogy of putting steroid cream on a rash to help explain the meaning of topical therapy and the need to get the drug to the target tissue. - trust but verify approach needed hered   - Plan ABC  reviewed from last set of written instructions and pasted into repeat instructions today  ? Active smoking > denies  ? Allergy > Prednisone 10 mg take  4 each am x 2 days,   2 each am x 2 days,  1 each am x 2 days and stop   ? Acid (or non-acid) GERD > always difficult to exclude as up to 75% of pts in some series report no assoc GI/ Heartburn symptoms> rec continue max (24h)  acid suppression and diet restrictions/ reviewed      I had an extended  discussion with the patient reviewing all relevant studies completed to date and  lasting 25 minutes of a 40  minute extended office visit  Noting the previous instructions are not being followed   Each maintenance medication was reviewed in detail including most importantly the difference between maintenance and prns and under what circumstances the prns are to be triggered using an action plan format that is not reflected in the computer generated alphabetically organized AVS.    Please see instructions for details which were reviewed in writing and the patient given a copy highlighting the part that I personally wrote and discussed at today's ov.
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
## 2016-02-29 NOTE — Patient Instructions (Addendum)
Plan A = Automatic = symbicort 160 Take 2 puffs first thing in am and then another 2 puffs about 12 hours later spiriva 2 pffs each  Plan B = Backup Only use your albuterol (ventolin) as a rescue medication up to 2 pffs every 4 hours if needed  Plan C = Crisis - only use your albuterol nebulizer if you first try Plan B and it fails to help > ok to use the nebulizer up to every 4 hours but if start needing it regularly call for immediate appointment   Work on inhaler technique:  relax and gently blow all the way out then take a nice smooth deep breath back in, triggering the inhaler at same time you start breathing in.  Hold for up to 5 seconds if you can. Blow out thru nose. Rinse and gargle with water when done     Prednisone 10 mg take  4 each am x 2 days,   2 each am x 2 days,  1 each am x 2 days and stop    Please schedule a follow up visit in 3 months but call sooner if needed - bring all inhalers with you

## 2016-03-01 ENCOUNTER — Ambulatory Visit (INDEPENDENT_AMBULATORY_CARE_PROVIDER_SITE_OTHER): Payer: Medicare Other | Admitting: Internal Medicine

## 2016-03-01 ENCOUNTER — Encounter: Payer: Self-pay | Admitting: Internal Medicine

## 2016-03-01 VITALS — BP 110/68 | HR 108 | Temp 97.8°F | Ht 65.0 in | Wt 110.6 lb

## 2016-03-01 DIAGNOSIS — Z8639 Personal history of other endocrine, nutritional and metabolic disease: Secondary | ICD-10-CM

## 2016-03-01 DIAGNOSIS — M4682 Other specified inflammatory spondylopathies, cervical region: Secondary | ICD-10-CM

## 2016-03-01 DIAGNOSIS — J449 Chronic obstructive pulmonary disease, unspecified: Secondary | ICD-10-CM | POA: Diagnosis not present

## 2016-03-01 DIAGNOSIS — E875 Hyperkalemia: Secondary | ICD-10-CM | POA: Diagnosis not present

## 2016-03-01 DIAGNOSIS — M47812 Spondylosis without myelopathy or radiculopathy, cervical region: Secondary | ICD-10-CM

## 2016-03-01 MED ORDER — BUDESONIDE-FORMOTEROL FUMARATE 160-4.5 MCG/ACT IN AERO: 2.0000 | INHALATION_SPRAY | Freq: Two times a day (BID) | RESPIRATORY_TRACT | Status: DC

## 2016-03-01 MED ORDER — ALBUTEROL SULFATE HFA 108 (90 BASE) MCG/ACT IN AERS: 2.0000 | INHALATION_SPRAY | Freq: Four times a day (QID) | RESPIRATORY_TRACT | Status: DC | PRN

## 2016-03-01 MED ORDER — GABAPENTIN 400 MG PO CAPS: 400.0000 mg | ORAL_CAPSULE | Freq: Three times a day (TID) | ORAL | Status: DC

## 2016-03-01 NOTE — Assessment & Plan Note (Signed)
Assessment She reports some relief with the ibuprofen that I prescribed to her at her last visit. She is scheduled to start physical therapy on Monday. Her physical exam findings are consistent with imaging findings. Though there is no clear evidence regarding gabapentin in the setting of cervical radiculopathy, she did experience some relief with that when she had similar symptoms on the right side.  Plan -Continue ibuprofen 800 mg every 8 hours as needed -Prescribed gabapentin 400 mg to be taken 3 times daily -Reassess in one month

## 2016-03-01 NOTE — Assessment & Plan Note (Addendum)
Assessment She denies any symptoms like palpitations or chest pain. She thinks her potassium will be elevated again since she ate a lot of watermelon prior to her visit.  Plan Repeat BMET today  ADDENDUM 03/02/2016  12:47 PM:  Renal function is stable at creatinine are 0.9-1.1. Potassium also reassuring at 5.0. Attempted to call patient with results though was unable to reach her so will have triage RN call on Monday.

## 2016-03-01 NOTE — Progress Notes (Signed)
   Subjective:    Patient ID: Mary Jenkins, female    DOB: 1956-07-14, 60 y.o.   MRN: [id number]  HPI Mary Jenkins is a 60 year old female who presents today for potassium recheck. Please see assessment & plan for status of chronic medical problems.    Review of Systems     Objective:   Physical Exam  Constitutional: She is oriented to person, place, and time. She appears well-developed and well-nourished.  HENT:  Head: Normocephalic and atraumatic.  Eyes: Conjunctivae are normal. No scleral icterus.  Pulmonary/Chest: No respiratory distress. She has wheezes.  Musculoskeletal:  Reproduction of burning pain with left lateral neck flexion and shoulder abduction. No sensory deficit noted. No lesions noted in the affected area. No tenderness with palpation. 5 out of 5 left upper extremity strength.  Neurological: She is alert and oriented to person, place, and time.  Skin: Skin is warm and dry.          Assessment & Plan:

## 2016-03-01 NOTE — Patient Instructions (Addendum)
Please take gabapentin 1 tablet three times daily along with the ibuprofen.   Let's see each other back next month to see how it's working.  For advance directives, try PREPARE for Your Care: [email or web address]
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
## 2016-03-02 ENCOUNTER — Telehealth: Payer: Self-pay | Admitting: Internal Medicine

## 2016-03-02 LAB — BMP8+ANION GAP
ANION GAP: 20 mmol/L — AB (ref 10.0–18.0)
BUN/Creatinine Ratio: 16 (ref 9–23)
BUN: 17 mg/dL (ref 6–24)
CO2: 22 mmol/L (ref 18–29)
Calcium: 10.2 mg/dL (ref 8.7–10.2)
Chloride: 100 mmol/L (ref 96–106)
Creatinine, Ser: 1.09 mg/dL — ABNORMAL HIGH (ref 0.57–1.00)
GFR calc Af Amer: 64 mL/min/{1.73_m2} (ref >59)
GFR, EST NON AFRICAN AMERICAN: 56 mL/min/{1.73_m2} — AB (ref >59)
GLUCOSE: 120 mg/dL — AB (ref 65–99)
POTASSIUM: 5 mmol/L (ref 3.5–5.2)
Sodium: 142 mmol/L (ref 134–144)

## 2016-03-02 NOTE — Telephone Encounter (Signed)
I attempted to call this patient several months ago to let her know that her potassium level is reassuring but was unable to reach her.  Please call patient to let her know.   Thank you.

## 2016-03-04 ENCOUNTER — Ambulatory Visit: Payer: Medicare Other | Attending: Internal Medicine | Admitting: Physical Therapy

## 2016-03-04 DIAGNOSIS — M542 Cervicalgia: Secondary | ICD-10-CM

## 2016-03-04 NOTE — Therapy (Signed)
[place], [place], [address] Phone: [phone]   Fax:  [phone]  Physical Therapy Evaluation  Patient Details  Name: Mary Jenkins MRN: [id number] Date of Birth: [birth year] Referring Provider: Gilles Chiquito  Encounter Date: 03/04/2016      PT End of Session - 03/04/16 1308    Visit Number 1   Date for PT Re-Evaluation 05/04/16   PT Start Time 1250   PT Stop Time 1338   PT Time Calculation (min) 48 min   Activity Tolerance Patient tolerated treatment well   Behavior During Therapy [facility] for tasks assessed/performed      Past Medical History  Diagnosis Date  . Personal history of chemotherapy 1997  . GERD (gastroesophageal reflux disease) 02/03/2012  . DDD (degenerative disc disease), cervical   . Full dentures   . Exertional shortness of breath     states if she "takes her time" doing activities does not get SOB  . COPD (chronic obstructive pulmonary disease) (HCC)     no home O2  . History of breast cancer   . High cholesterol     no current med.  . Family history of adverse reaction to anesthesia     pt's sister has hx. of post-op N/V  . Cataract, immature     bilateral  . Cough 02/09/2015  . Runny nose 02/09/2015    clear drainage, per pt.    Past Surgical History  Procedure Laterality Date  . Tubal ligation  1980's  . Colonoscopy    . Mastectomy, partial Right 1997    -node dissection  . Breast capsulotomy with implant exchange Right 06/21/2013    Procedure: REVISION RIGHT BREAST RECONSTRUCTION/REMOVAL OF RIGHT IMPLANT/RIGHT BREAST CAPSULOTOMY WITH INSERT TISSUE EXPLANDER RIGHT BREAST/POSSIBLE LEFT BREAST MASTOPEXY;  Surgeon: Cristine Polio, MD;  Location: [facility];  Service: Plastics;  Laterality: Right;  . Tissue expander placement Right 03/14/2014    Procedure: SALINE REMOVAL RIGHT TISSUE EXPANDER;  Surgeon: Cristine Polio, MD;  Location: [facility];  Service: Plastics;  Laterality: Right;  . Breast surgery  97    implant  . Breast reconstruction Right 06/21/2013    Procedure: BREAST RECONSTRUCTION;  Surgeon: Cristine Polio, MD;  Location: [facility];  Service: Plastics;  Laterality: Right;  . Breast reconstruction Left 11/29/2013    Procedure: LEFT MASTOPEXY FOR RECONSTRUCTION;  Surgeon: Cristine Polio, MD;  Location: [place];  Service: Plastics;  Laterality: Left;  . Breast reconstruction Right 03/14/2014    Procedure: RECONSTRUCTION NIPPLE RIGHT BREAST;  Surgeon: Cristine Polio, MD;  Location: [place];  Service: Plastics;  Laterality: Right;  . Scar revision Right 02/13/2015    Procedure: SCAR REVISION RIGHT BREAST;  Surgeon: Cristine Polio, MD;  Location: [facility];  Service: Plastics;  Laterality: Right;  . Removal of tissue expander and placement of implant Right 02/13/2015    Procedure: REMOVAL OF TISSUE  EXPANDER  PORT RIGHT BREAST;  Surgeon: Cristine Polio, MD;  Location: [place];  Service: Plastics;  Laterality: Right;    There were no vitals filed for this visit.       Subjective Assessment - 03/04/16 1254    Subjective Patient reports that she has had neck pain for about 3 years.  She has MRI that shows bulges, stenosis and DDD throughout the cervical spine.  She is unsure of a specific cause for the  pain recently.   Limitations Reading;House hold activities   Patient Stated Goals have less pain   Currently in Pain? Yes   Pain Score 10-Worst pain ever   Pain Location Neck   Pain Orientation Left   Pain Descriptors / Indicators Constant;Burning   Pain Type Chronic pain   Pain Onset More than a month ago   Pain Frequency Constant   Aggravating Factors  sitting, driving will increase pain   Pain Relieving Factors heat helps a little but at best the pain is a 3/10   Effect of Pain on Daily Activities limits all ADL's             [facility] PT Assessment - 03/04/16 0001    Assessment   Medical Diagnosis neck pain   Referring Provider Gilles Chiquito   Onset Date/Surgical Date 02/02/16   Hand Dominance Right   Prior Therapy about two years ago   Precautions   Precaution Comments has COPD and has difficulty breathing    Balance Screen   Has the patient fallen in the past 6 months No   Has the patient had a decrease in activity level because of a fear of falling?  No   Is the patient reluctant to leave their home because of a fear of falling?  No   Home Environment   Additional Comments does some housework   Prior Function   Level of Independence Independent   Vocation On disability   Leisure no exercises   Posture/Postural Control   Posture Comments fwd head, rounded shoulders   ROM / Strength   AROM / PROM / Strength AROM;Strength   AROM   Overall AROM Comments Cervical ROM was WNL's for flexion, decreased 75% for extension, decreased 50% for side bending and rotation, shoulders are WFL's   Strength   Overall Strength Comments 4-/5 with increased pain in the left upper trap   Palpation   Palpation comment spasms and tenderness are palapble in the left cervical parapsinals, the left upper trap, the rhomboid and the posterior left shoulder   Special Tests    Special Tests --  manual cervical distraction felt better but was sore                   [facility] Adult PT Treatment/Exercise - 03/04/16 0001    Exercises   Exercises Neck   Modalities   Modalities Moist Heat;Electrical Stimulation   Moist Heat Therapy   Number Minutes Moist Heat 15 Minutes   Moist Heat Location Shoulder;Cervical   Electrical Stimulation   Electrical Stimulation Location left CT and upper trap area   Electrical Stimulation Action IFC   Electrical Stimulation Parameters sitting   Electrical Stimulation Goals Pain                PT Education - 03/04/16 1308    Education provided Yes   Education  Details cervical and scapular retraction, shoulder shrugs   Person(s) Educated Patient   Methods Explanation;Demonstration;Handout   Comprehension Verbalized understanding;Returned demonstration          PT Short Term Goals - 03/04/16 1320    PT SHORT TERM GOAL #1   Title independent with initial HEP   Time 2   Period Weeks   Status New           PT Long Term Goals - 03/04/16 1320    PT LONG TERM GOAL #1   Title understand proper psoture and body mechanics   Time 8  Period Weeks   Status New   PT LONG TERM GOAL #2   Title decrease pain 50%   Time 8   Period Weeks   Status New   PT LONG TERM GOAL #3   Title increase ROM of the cervical spine by 50%   Time 8   Period Weeks   Status New   PT LONG TERM GOAL #4   Title report no difficulty driving   Time 8   Period Weeks   Status New               Plan - 03/04/16 1308    Clinical Impression Statement Patient with neck pain for about 3 years.  Her MRI showed stenosis, bulging discs and DDD.  She has significant spasms and tenderness in the upper traps and cervical area.  She may benefit from cervical traction.   Rehab Potential Good   PT Frequency 2x / week   PT Duration 8 weeks   PT Treatment/Interventions ADLs/Self Care Home Management;Electrical Stimulation;Moist Heat;Therapeutic exercise;Therapeutic activities;Ultrasound;Traction;Neuromuscular re-education;Patient/family education;Manual techniques   PT Next Visit Plan Slowly add exercises, try traction   Consulted and Agree with Plan of Care Patient      Patient will benefit from skilled therapeutic intervention in order to improve the following deficits and impairments:  Cardiopulmonary status limiting activity, Decreased activity tolerance, Decreased mobility, Decreased range of motion, Decreased strength, Increased fascial restricitons, Increased muscle spasms, Impaired perceived functional ability, Impaired flexibility, Postural dysfunction, Improper  body mechanics, Pain  Visit Diagnosis: Cervicalgia - Plan: PT plan of care cert/re-cert      G-Codes - 38/18/29 1322    Functional Assessment Tool Used foto 61% limitation   Functional Limitation Self care   Self Care Current Status (H3716) At least 60 percent but less than 80 percent impaired, limited or restricted   Self Care Goal Status (R6789) At least 40 percent but less than 60 percent impaired, limited or restricted       Problem List Patient Active Problem List   Diagnosis Date Noted  . History of hyperkalemia 02/13/2016  . History of trichomonal vaginitis 09/29/2015  . Bruise 12/07/2014  . Skin lesion of back 11/15/2014  . Visit for screening mammogram 11/09/2014  . Atypical chest pain 10/21/2014  . Cataract 10/21/2014  . H/O hyperglycemia 10/21/2014  . Oral ulcer 08/31/2014  . Right leg pain 08/05/2014  . Right-sided thoracic back pain 07/07/2014  . Dyslipidemia, goal to be determined 04/22/2014  . Subcutaneous nodule 01/17/2014  . Healthcare maintenance 07/05/2013  . Lipoma 10/07/2012  . 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 History of breast cancer 07/23/2012  . GERD (gastroesophageal reflux disease) 02/03/2012  . COPD GOLD IV 06/24/2011  . Liver cyst 06/24/2011    Sumner Boast., PT 03/04/2016, 1:33 PM  [place], [place], [address] Phone: [phone]   Fax:  [phone]  Name: FREDONIA CASALINO MRN: [id number] Date of Birth: 08-28-1956

## 2016-03-04 NOTE — Progress Notes (Signed)
Internal Medicine Clinic Attending  Case discussed with Dr. Charlott Rakes soon after the resident saw the patient.  We reviewed the resident's history and exam and pertinent patient test results.  I agree with the assessment, diagnosis, and plan of care documented in the resident's note.

## 2016-03-07 ENCOUNTER — Encounter: Payer: Self-pay | Admitting: Physical Therapy

## 2016-03-07 ENCOUNTER — Ambulatory Visit: Payer: Medicare Other | Admitting: Physical Therapy

## 2016-03-07 DIAGNOSIS — M542 Cervicalgia: Secondary | ICD-10-CM | POA: Diagnosis not present

## 2016-03-07 NOTE — Therapy (Signed)
[place], [place], [address] Phone: [phone]   Fax:  [phone]  Physical Therapy Treatment  Patient Details  Name: Mary Jenkins MRN: [id number] Date of Birth: [birth year] Referring Provider: Gilles Chiquito  Encounter Date: 03/07/2016      PT End of Session - 03/07/16 1651    Visit Number 2   Date for PT Re-Evaluation 05/04/16   PT Start Time 1615   PT Stop Time 1705   PT Time Calculation (min) 50 min      Past Medical History  Diagnosis Date  . Personal history of chemotherapy 1997  . GERD (gastroesophageal reflux disease) 02/03/2012  . DDD (degenerative disc disease), cervical   . Full dentures   . Exertional shortness of breath     states if she "takes her time" doing activities does not get SOB  . COPD (chronic obstructive pulmonary disease) (HCC)     no home O2  . History of breast cancer   . High cholesterol     no current med.  . Family history of adverse reaction to anesthesia     pt's sister has hx. of post-op N/V  . Cataract, immature     bilateral  . Cough 02/09/2015  . Runny nose 02/09/2015    clear drainage, per pt.    Past Surgical History  Procedure Laterality Date  . Tubal ligation  1980's  . Colonoscopy    . Mastectomy, partial Right 1997    -node dissection  . Breast capsulotomy with implant exchange Right 06/21/2013    Procedure: REVISION RIGHT BREAST RECONSTRUCTION/REMOVAL OF RIGHT IMPLANT/RIGHT BREAST CAPSULOTOMY WITH INSERT TISSUE EXPLANDER RIGHT BREAST/POSSIBLE LEFT BREAST MASTOPEXY;  Surgeon: Cristine Polio, MD;  Location: [facility];  Service: Plastics;  Laterality: Right;  . Tissue expander placement Right 03/14/2014    Procedure: SALINE REMOVAL RIGHT TISSUE EXPANDER;  Surgeon: Cristine Polio, MD;  Location: [facility];  Service: Plastics;  Laterality: Right;  . Breast surgery  97    implant  . Breast reconstruction  Right 06/21/2013    Procedure: BREAST RECONSTRUCTION;  Surgeon: Cristine Polio, MD;  Location: [facility];  Service: Plastics;  Laterality: Right;  . Breast reconstruction Left 11/29/2013    Procedure: LEFT MASTOPEXY FOR RECONSTRUCTION;  Surgeon: Cristine Polio, MD;  Location: [facility];  Service: Plastics;  Laterality: Left;  . Breast reconstruction Right 03/14/2014    Procedure: RECONSTRUCTION NIPPLE RIGHT BREAST;  Surgeon: Cristine Polio, MD;  Location: [facility];  Service: Plastics;  Laterality: Right;  . Scar revision Right 02/13/2015    Procedure: SCAR REVISION RIGHT BREAST;  Surgeon: Cristine Polio, MD;  Location: [facility];  Service: Plastics;  Laterality: Right;  . Removal of tissue expander and placement of implant Right 02/13/2015    Procedure: REMOVAL OF TISSUE  EXPANDER  PORT RIGHT BREAST;  Surgeon: Cristine Polio, MD;  Location: [facility];  Service: Plastics;  Laterality: Right;    There were no vitals filed for this visit.      Subjective Assessment - 03/07/16 1636    Subjective same as eval,really hurting   Currently in Pain? Yes   Pain Score 10-Worst pain ever   Pain Location Neck   Pain Orientation Left                         [facility] Adult PT  Treatment/Exercise - 03/07/16 0001    Modalities   Modalities Moist Heat;Traction;Ultrasound;Electrical Stimulation   Moist Heat Therapy   Number Minutes Moist Heat 15 Minutes   Moist Heat Location Shoulder;Cervical   Electrical Stimulation   Electrical Stimulation Location left CT and upper trap area   Electrical Stimulation Action IFC   Electrical Stimulation Parameters sitting   Electrical Stimulation Goals Pain   Ultrasound   Ultrasound Location left UT/cerv   Ultrasound Parameters 53mz 1.1 w/cm2 8 min   Ultrasound Goals Pain   Traction   Type of Traction Cervical   Min (lbs) 10   Time 10   Manual Therapy   Manual  Therapy Soft tissue mobilization   Manual therapy comments very guarded and painful   Soft tissue mobilization left cerv and UT                  PT Short Term Goals - 03/04/16 1320    PT SHORT TERM GOAL #1   Title independent with initial HEP   Time 2   Period Weeks   Status New           PT Long Term Goals - 03/04/16 1320    PT LONG TERM GOAL #1   Title understand proper psoture and body mechanics   Time 8   Period Weeks   Status New   PT LONG TERM GOAL #2   Title decrease pain 50%   Time 8   Period Weeks   Status New   PT LONG TERM GOAL #3   Title increase ROM of the cervical spine by 50%   Time 8   Period Weeks   Status New   PT LONG TERM GOAL #4   Title report no difficulty driving   Time 8   Period Weeks   Status New               Plan - 03/07/16 1651    Clinical Impression Statement relief with modalities including traction. pt can lye but needs bed inclined d/t COPD. Very guarded and tender with STW,multi trigger points.   PT Next Visit Plan slowly add ther ex and continue with modalities      Patient will benefit from skilled therapeutic intervention in order to improve the following deficits and impairments:  Cardiopulmonary status limiting activity, Decreased activity tolerance, Decreased mobility, Decreased range of motion, Decreased strength, Increased fascial restricitons, Increased muscle spasms, Impaired perceived functional ability, Impaired flexibility, Postural dysfunction, Improper body mechanics, Pain  Visit Diagnosis: Cervicalgia     Problem List Patient Active Problem List   Diagnosis Date Noted  . History of hyperkalemia 02/13/2016  . History of trichomonal vaginitis 09/29/2015  . Bruise 12/07/2014  . Skin lesion of back 11/15/2014  . Visit for screening mammogram 11/09/2014  . Atypical chest pain 10/21/2014  . Cataract 10/21/2014  . H/O hyperglycemia 10/21/2014  . Oral ulcer 08/31/2014  . Right leg pain  08/05/2014  . Right-sided thoracic back pain 07/07/2014  . Dyslipidemia, goal to be determined 04/22/2014  . Subcutaneous nodule 01/17/2014  . Healthcare maintenance 07/05/2013  . Lipoma 10/07/2012  . [place]ical spine [place] 08/27/2012  . History of breast cancer 07/23/2012  . GERD (gastroesophageal reflux disease) 02/03/2012  . COPD GOLD IV 06/24/2011  . Liver cyst 06/24/2011    PAYSEUR,ANGIE PTA 03/07/2016, 4:55 PM  [place] [address]: [phone]  Fax:  [phone]  Name: IRELYNN SCHERMERHORN MRN: [id number] Date of Birth: [birth year]

## 2016-03-12 ENCOUNTER — Ambulatory Visit: Payer: Medicare Other | Admitting: Physical Therapy

## 2016-03-12 ENCOUNTER — Encounter: Payer: Self-pay | Admitting: Physical Therapy

## 2016-03-12 DIAGNOSIS — M542 Cervicalgia: Secondary | ICD-10-CM | POA: Diagnosis not present

## 2016-03-12 NOTE — Therapy (Signed)
[place], [place], [address] Phone: [phone]   Fax:  [phone]  Physical Therapy Treatment  Patient Details  Name: Mary Jenkins MRN: [id number] Date of Birth: 08/27/1956 Referring Provider: Gilles Chiquito  Encounter Date: 03/12/2016      PT End of Session - 03/12/16 1327    Visit Number 3   Date for PT Re-Evaluation 05/04/16   PT Start Time 1310   PT Stop Time 1350   PT Time Calculation (min) 40 min      Past Medical History  Diagnosis Date  . Personal history of chemotherapy 1997  . GERD (gastroesophageal reflux disease) 02/03/2012  . DDD (degenerative disc disease), cervical   . Full dentures   . Exertional shortness of breath     states if she "takes her time" doing activities does not get SOB  . COPD (chronic obstructive pulmonary disease) (HCC)     no home O2  . History of breast cancer   . High cholesterol     no current med.  . Family history of adverse reaction to anesthesia     pt's sister has hx. of post-op N/V  . Cataract, immature     bilateral  . Cough 02/09/2015  . Runny nose 02/09/2015    clear drainage, per pt.    Past Surgical History  Procedure Laterality Date  . Tubal ligation  1980's  . Colonoscopy    . Mastectomy, partial Right 1997    -node dissection  . Breast capsulotomy with implant exchange Right 06/21/2013    Procedure: REVISION RIGHT BREAST RECONSTRUCTION/REMOVAL OF RIGHT IMPLANT/RIGHT BREAST CAPSULOTOMY WITH INSERT TISSUE EXPLANDER RIGHT BREAST/POSSIBLE LEFT BREAST MASTOPEXY;  Surgeon: Cristine Polio, MD;  Location: [facility];  Service: Plastics;  Laterality: Right;  . Tissue expander placement Right 03/14/2014    Procedure: SALINE REMOVAL RIGHT TISSUE EXPANDER;  Surgeon: Cristine Polio, MD;  Location: [facility];  Service: Plastics;  Laterality: Right;  . Breast surgery  97    implant  . Breast reconstruction  Right 06/21/2013    Procedure: BREAST RECONSTRUCTION;  Surgeon: Cristine Polio, MD;  Location: [facility];  Service: Plastics;  Laterality: Right;  . Breast reconstruction Left 11/29/2013    Procedure: LEFT MASTOPEXY FOR RECONSTRUCTION;  Surgeon: Cristine Polio, MD;  Location: [facility];  Service: Plastics;  Laterality: Left;  . Breast reconstruction Right 03/14/2014    Procedure: RECONSTRUCTION NIPPLE RIGHT BREAST;  Surgeon: Cristine Polio, MD;  Location: [facility];  Service: Plastics;  Laterality: Right;  . Scar revision Right 02/13/2015    Procedure: SCAR REVISION RIGHT BREAST;  Surgeon: Cristine Polio, MD;  Location: [facility];  Service: Plastics;  Laterality: Right;  . Removal of tissue expander and placement of implant Right 02/13/2015    Procedure: REMOVAL OF TISSUE  EXPANDER  PORT RIGHT BREAST;  Surgeon: Cristine Polio, MD;  Location: [facility];  Service: Plastics;  Laterality: Right;    There were no vitals filed for this visit.      Subjective Assessment - 03/12/16 1305    Subjective no pian today but I am sure it will not last   Currently in Pain? No/denies                         [facility] Adult PT Treatment/Exercise - 03/12/16 0001    Neck Exercises: Theraband  Scapula Retraction 10 reps  yellow 3 way   Neck Exercises: Standing   Neck Retraction 10 reps;3 secs  with ball   Other Standing Exercises 3# shruggs and backward rolls 10 times each   Manual Therapy   Manual Therapy Soft tissue mobilization   Manual therapy comments no tenderness noted, full ROM                PT Education - 03/12/16 1326    Education provided Yes   Education Details yellow tband   Person(s) Educated Patient   Methods Explanation;Demonstration;Handout   Comprehension Verbalized understanding;Returned demonstration          PT Short Term Goals - 03/12/16 1328    PT SHORT TERM GOAL  #1   Title independent with initial HEP   Status Achieved           PT Long Term Goals - 03/04/16 1320    PT LONG TERM GOAL #1   Title understand proper psoture and body mechanics   Time 8   Period Weeks   Status New   PT LONG TERM GOAL #2   Title decrease pain 50%   Time 8   Period Weeks   Status New   PT LONG TERM GOAL #3   Title increase ROM of the cervical spine by 50%   Time 8   Period Weeks   Status New   PT LONG TERM GOAL #4   Title report no difficulty driving   Time 8   Period Weeks   Status New               Plan - 03/12/16 1327    Clinical Impression Statement no pain, no tightness, full ROM. issued HEP ( limited ther ex d/t breathing) . explained need for stronge muscles to support spine   PT Next Visit Plan assess and progress      Patient will benefit from skilled therapeutic intervention in order to improve the following deficits and impairments:  Cardiopulmonary status limiting activity, Decreased activity tolerance, Decreased mobility, Decreased range of motion, Decreased strength, Increased fascial restricitons, Increased muscle spasms, Impaired perceived functional ability, Impaired flexibility, Postural dysfunction, Improper body mechanics, Pain  Visit Diagnosis: Cervicalgia     Problem List Patient Active Problem List   Diagnosis Date Noted  . History of hyperkalemia 02/13/2016  . History of trichomonal vaginitis 09/29/2015  . Bruise 12/07/2014  . Skin lesion of back 11/15/2014  . Visit for screening mammogram 11/09/2014  . Atypical chest pain 10/21/2014  . Cataract 10/21/2014  . H/O hyperglycemia 10/21/2014  . Oral ulcer 08/31/2014  . Right leg pain 08/05/2014  . Right-sided thoracic back pain 07/07/2014  . Dyslipidemia, goal to be determined 04/22/2014  . Subcutaneous nodule 01/17/2014  . Healthcare maintenance 07/05/2013  . Lipoma 10/07/2012  . 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 History of breast cancer  07/23/2012  . GERD (gastroesophageal reflux disease) 02/03/2012  . COPD GOLD IV 06/24/2011  . Liver cyst 06/24/2011    PAYSEUR,ANGIE PTA 03/12/2016, 1:29 PM  [place], [place], [address] Phone: [phone]   Fax:  [phone]  Name: Mary Jenkins MRN: [id number] Date of Birth: 1956/02/03

## 2016-03-14 ENCOUNTER — Encounter: Payer: Self-pay | Admitting: Physical Therapy

## 2016-03-14 ENCOUNTER — Ambulatory Visit: Payer: Medicare Other | Admitting: Physical Therapy

## 2016-03-14 DIAGNOSIS — M542 Cervicalgia: Secondary | ICD-10-CM | POA: Diagnosis not present

## 2016-03-14 NOTE — Therapy (Signed)
[place], [place], [address] Phone: [phone]   Fax:  [phone]  Physical Therapy Treatment  Patient Details  Name: Mary Jenkins MRN: [id number] Date of Birth: 1956-04-30 Referring Provider: Gilles Chiquito  Encounter Date: 03/14/2016      PT End of Session - 03/14/16 1514    Visit Number 4   Date for PT Re-Evaluation 05/04/16   PT Start Time 9675   PT Stop Time 1530   PT Time Calculation (min) 45 min      Past Medical History  Diagnosis Date  . Personal history of chemotherapy 1997  . GERD (gastroesophageal reflux disease) 02/03/2012  . DDD (degenerative disc disease), cervical   . Full dentures   . Exertional shortness of breath     states if she "takes her time" doing activities does not get SOB  . COPD (chronic obstructive pulmonary disease) (HCC)     no home O2  . History of breast cancer   . High cholesterol     no current med.  . Family history of adverse reaction to anesthesia     pt's sister has hx. of post-op N/V  . Cataract, immature     bilateral  . Cough 02/09/2015  . Runny nose 02/09/2015    clear drainage, per pt.    Past Surgical History  Procedure Laterality Date  . Tubal ligation  1980's  . Colonoscopy    . Mastectomy, partial Right 1997    -node dissection  . Breast capsulotomy with implant exchange Right 06/21/2013    Procedure: REVISION RIGHT BREAST RECONSTRUCTION/REMOVAL OF RIGHT IMPLANT/RIGHT BREAST CAPSULOTOMY WITH INSERT TISSUE EXPLANDER RIGHT BREAST/POSSIBLE LEFT BREAST MASTOPEXY;  Surgeon: Cristine Polio, MD;  Location: [facility];  Service: Plastics;  Laterality: Right;  . Tissue expander placement Right 03/14/2014    Procedure: SALINE REMOVAL RIGHT TISSUE EXPANDER;  Surgeon: Cristine Polio, MD;  Location: [facility];  Service: Plastics;  Laterality: Right;  . Breast surgery  97    implant  . Breast reconstruction  Right 06/21/2013    Procedure: BREAST RECONSTRUCTION;  Surgeon: Cristine Polio, MD;  Location: [facility];  Service: Plastics;  Laterality: Right;  . Breast reconstruction Left 11/29/2013    Procedure: LEFT MASTOPEXY FOR RECONSTRUCTION;  Surgeon: Cristine Polio, MD;  Location: [facility];  Service: Plastics;  Laterality: Left;  . Breast reconstruction Right 03/14/2014    Procedure: RECONSTRUCTION NIPPLE RIGHT BREAST;  Surgeon: Cristine Polio, MD;  Location: [facility];  Service: Plastics;  Laterality: Right;  . Scar revision Right 02/13/2015    Procedure: SCAR REVISION RIGHT BREAST;  Surgeon: Cristine Polio, MD;  Location: [facility];  Service: Plastics;  Laterality: Right;  . Removal of tissue expander and placement of implant Right 02/13/2015    Procedure: REMOVAL OF TISSUE  EXPANDER  PORT RIGHT BREAST;  Surgeon: Cristine Polio, MD;  Location: [facility];  Service: Plastics;  Laterality: Right;    There were no vitals filed for this visit.      Subjective Assessment - 03/14/16 1451    Subjective doing well still, certain ways I move or sit increase pain   Currently in Pain? No/denies                         [facility] Adult PT Treatment/Exercise - 03/14/16 0001    Neck Exercises: Machines  for Strengthening   UBE (Upper Arm Bike) 1 min fwd/1 min back  increase Left arm burning   Neck Exercises: Standing   Neck Retraction 10 reps;3 secs   Other Standing Exercises tricep ext 15# 10 times   Other Standing Exercises bicep 10# 10 times   Neck Exercises: Seated   Shoulder Flexion Both;10 reps;Weights   Shoulder Flexion Weights (lbs) 2   Shoulder ABduction Both;10 reps;Weights   Shoulder Abduction Weights (lbs) 2   Other Seated Exercise yellow wt ball chest press 10 times and OH press 10 times   Other Seated Exercise 2# ext and rows   Moist Heat Therapy   Number Minutes Moist Heat 15 Minutes    Moist Heat Location Shoulder;Cervical   Electrical Stimulation   Electrical Stimulation Location left CT and upper trap area   Electrical Stimulation Action IFC   Electrical Stimulation Parameters sitting   Electrical Stimulation Goals Pain                  PT Short Term Goals - 03/12/16 1328    PT SHORT TERM GOAL #1   Title independent with initial HEP   Status Achieved           PT Long Term Goals - 03/14/16 1504    PT LONG TERM GOAL #1   Title understand proper psoture and body mechanics   Status On-going   PT LONG TERM GOAL #2   Title decrease pain 50%   Baseline no pain last 2 sessions   Status Achieved   PT LONG TERM GOAL #3   Title increase ROM of the cervical spine by 50%   Baseline WFLs   Status Achieved   PT LONG TERM GOAL #4   Title report no difficulty driving   Status On-going               Plan - 03/14/16 1514    Clinical Impression Statement no pain,full cerv ROM. UE weakness, left greater than RT. Left MMY 3/5. Fatiigued easily with ther ex d/t weakness and COPD-freq rest. Progressing with goals. With a few ther ex c/o Left radicular symptoms.   PT Next Visit Plan strengthening and monitor pain      Patient will benefit from skilled therapeutic intervention in order to improve the following deficits and impairments:  Cardiopulmonary status limiting activity, Decreased activity tolerance, Decreased mobility, Decreased range of motion, Decreased strength, Increased fascial restricitons, Increased muscle spasms, Impaired perceived functional ability, Impaired flexibility, Postural dysfunction, Improper body mechanics, Pain  Visit Diagnosis: Cervicalgia     Problem List Patient Active Problem List   Diagnosis Date Noted  . History of hyperkalemia 02/13/2016  . History of trichomonal vaginitis 09/29/2015  . Bruise 12/07/2014  . Skin lesion of back 11/15/2014  . Visit for screening mammogram 11/09/2014  . Atypical chest pain  10/21/2014  . Cataract 10/21/2014  . H/O hyperglycemia 10/21/2014  . Oral ulcer 08/31/2014  . Right leg pain 08/05/2014  . Right-sided thoracic back pain 07/07/2014  . Dyslipidemia, goal to be determined 04/22/2014  . Subcutaneous nodule 01/17/2014  . Healthcare maintenance 07/05/2013  . Lipoma 10/07/2012  . 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 History of breast cancer 07/23/2012  . GERD (gastroesophageal reflux disease) 02/03/2012  . COPD GOLD IV 06/24/2011  . Liver cyst 06/24/2011    PAYSEUR,ANGIE PTA 03/14/2016, 3:17 PM  [place], [place], [address] Phone: [phone]  Fax:  [phone]  Name: Mary Jenkins MRN: [id number] Date of Birth: [birth year]

## 2016-03-17 ENCOUNTER — Other Ambulatory Visit: Payer: Self-pay

## 2016-03-17 ENCOUNTER — Emergency Department (HOSPITAL_COMMUNITY)
Admission: EM | Admit: 2016-03-17 | Discharge: 2016-03-17 | Disposition: A | Discharge status: Home / Self Care | Payer: Medicare Other | Attending: Emergency Medicine | Admitting: Emergency Medicine

## 2016-03-17 ENCOUNTER — Encounter (HOSPITAL_COMMUNITY): Payer: Self-pay | Admitting: *Deleted

## 2016-03-17 DIAGNOSIS — J449 Chronic obstructive pulmonary disease, unspecified: Secondary | ICD-10-CM | POA: Insufficient documentation

## 2016-03-17 DIAGNOSIS — Z87891 Personal history of nicotine dependence: Secondary | ICD-10-CM | POA: Insufficient documentation

## 2016-03-17 DIAGNOSIS — R062 Wheezing: Secondary | ICD-10-CM

## 2016-03-17 DIAGNOSIS — F410 Panic disorder [episodic paroxysmal anxiety] without agoraphobia: Secondary | ICD-10-CM | POA: Insufficient documentation

## 2016-03-17 DIAGNOSIS — R0602 Shortness of breath: Secondary | ICD-10-CM | POA: Insufficient documentation

## 2016-03-17 MED ORDER — LORAZEPAM 2 MG/ML IJ SOLN
INTRAMUSCULAR | Status: AC
  Administered 2016-03-17: 17:00:00
  Filled 2016-03-17: qty 1

## 2016-03-17 MED ORDER — IPRATROPIUM-ALBUTEROL 0.5-2.5 (3) MG/3ML IN SOLN
RESPIRATORY_TRACT | Status: AC
  Administered 2016-03-17: 3 mL
  Filled 2016-03-17: qty 3

## 2016-03-17 NOTE — ED Notes (Signed)
Pt brought to Trauma A from waiting area after near syncope after finding out her nephew was deceased. Pt resp labored at rate of 35. Crying inconsolably. Placed on monitor. Given emotional support. RT called to bedside. MD to bedside.
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
## 2016-03-17 NOTE — ED Notes (Signed)
VERY SLEEPY   .  SLEEPS UNLESS DISTURBED

## 2016-03-19 ENCOUNTER — Encounter: Payer: Self-pay | Admitting: Physical Therapy

## 2016-03-19 ENCOUNTER — Ambulatory Visit: Payer: Medicare Other | Admitting: Physical Therapy

## 2016-03-19 DIAGNOSIS — M542 Cervicalgia: Secondary | ICD-10-CM | POA: Diagnosis not present
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
## 2016-03-21 ENCOUNTER — Encounter: Payer: Self-pay | Admitting: Physical Therapy

## 2016-03-21 ENCOUNTER — Ambulatory Visit: Payer: Medicare Other | Admitting: Physical Therapy

## 2016-03-21 DIAGNOSIS — M542 Cervicalgia: Secondary | ICD-10-CM

## 2016-03-21 NOTE — Therapy (Signed)
[place], [place], [address] Phone: [phone]   Fax:  [phone]  Physical Therapy Treatment  Patient Details  Name: Mary Jenkins MRN: [id number] Date of Birth: 04-30-1956 Referring Provider: Gilles Chiquito  Encounter Date: 03/21/2016      PT End of Session - 03/21/16 1603    Visit Number 6   PT Start Time 7169   PT Stop Time 1620   PT Time Calculation (min) 50 min      Past Medical History  Diagnosis Date  . Personal history of chemotherapy 1997  . GERD (gastroesophageal reflux disease) 02/03/2012  . DDD (degenerative disc disease), cervical   . Full dentures   . Exertional shortness of breath     states if she "takes her time" doing activities does not get SOB  . COPD (chronic obstructive pulmonary disease) (HCC)     no home O2  . History of breast cancer   . High cholesterol     no current med.  . Family history of adverse reaction to anesthesia     pt's sister has hx. of post-op N/V  . Cataract, immature     bilateral  . Cough 02/09/2015  . Runny nose 02/09/2015    clear drainage, per pt.    Past Surgical History  Procedure Laterality Date  . Tubal ligation  1980's  . Colonoscopy    . Mastectomy, partial Right 1997    -node dissection  . Breast capsulotomy with implant exchange Right 06/21/2013    Procedure: REVISION RIGHT BREAST RECONSTRUCTION/REMOVAL OF RIGHT IMPLANT/RIGHT BREAST CAPSULOTOMY WITH INSERT TISSUE EXPLANDER RIGHT BREAST/POSSIBLE LEFT BREAST MASTOPEXY;  Surgeon: Cristine Polio, MD;  Location: [place];  Service: Plastics;  Laterality: Right;  . Tissue expander placement Right 03/14/2014    Procedure: SALINE REMOVAL RIGHT TISSUE EXPANDER;  Surgeon: Cristine Polio, MD;  Location: [place];  Service: Plastics;  Laterality: Right;  . Breast surgery  97    implant  . Breast reconstruction Right 06/21/2013    Procedure: BREAST  RECONSTRUCTION;  Surgeon: Cristine Polio, MD;  Location: [place];  Service: Plastics;  Laterality: Right;  . Breast reconstruction Left 11/29/2013    Procedure: LEFT MASTOPEXY FOR RECONSTRUCTION;  Surgeon: Cristine Polio, MD;  Location: [place];  Service: Plastics;  Laterality: Left;  . Breast reconstruction Right 03/14/2014    Procedure: RECONSTRUCTION NIPPLE RIGHT BREAST;  Surgeon: Cristine Polio, MD;  Location: [place];  Service: Plastics;  Laterality: Right;  . Scar revision Right 02/13/2015    Procedure: SCAR REVISION RIGHT BREAST;  Surgeon: Cristine Polio, MD;  Location: [place];  Service: Plastics;  Laterality: Right;  . Removal of tissue expander and placement of implant Right 02/13/2015    Procedure: REMOVAL OF TISSUE  EXPANDER  PORT RIGHT BREAST;  Surgeon: Cristine Polio, MD;  Location: [place];  Service: Plastics;  Laterality: Right;    There were no vitals filed for this visit.      Subjective Assessment - 03/21/16 1531    Subjective doing better,feeling better. last tx helped   Currently in Pain? Yes   Pain Score 2    Pain Location Neck   Pain Orientation Left;Mid                         [facility] Adult PT Treatment/Exercise - 03/21/16 0001  Neck Exercises: Machines for Strengthening   UBE (Upper Arm Bike) 56fd/2 back L 1   Neck Exercises: Theraband   Scapula Retraction 10 reps  yellow 3 way   Neck Exercises: Standing   Other Standing Exercises 4# shruggs and rolls 10 times each   Other Standing Exercises 2 # can eex 10 reps   Moist Heat Therapy   Number Minutes Moist Heat 15 Minutes   Moist Heat Location Shoulder;Cervical   Electrical Stimulation   Electrical Stimulation Location cerv and UT  left   Electrical Stimulation Action IFC   Electrical Stimulation Goals Pain   Ultrasound   Ultrasound Location left UT/rhom   Ultrasound Parameters same    Ultrasound Goals Pain                  PT Short Term Goals - 03/12/16 1328    PT SHORT TERM GOAL #1   Title independent with initial HEP   Status Achieved           PT Long Term Goals - 03/21/16 1602    PT LONG TERM GOAL #1   Title understand proper psoture and body mechanics   Status Achieved   PT LONG TERM GOAL #2   Title decrease pain 50%   Status Achieved   PT LONG TERM GOAL #3   Title increase ROM of the cervical spine by 50%   Status Achieved   PT LONG TERM GOAL #4   Title report no difficulty driving   Status On-going               Plan - 03/21/16 1603    Clinical Impression Statement pt doing much better today,tolerated ther ex well ( with rest d/t breathing) progression with goals   PT Next Visit Plan check HEP and progress      Patient will benefit from skilled therapeutic intervention in order to improve the following deficits and impairments:  Cardiopulmonary status limiting activity, Decreased activity tolerance, Decreased mobility, Decreased range of motion, Decreased strength, Increased fascial restricitons, Increased muscle spasms, Impaired perceived functional ability, Impaired flexibility, Postural dysfunction, Improper body mechanics, Pain  Visit Diagnosis: Cervicalgia     Problem List Patient Active Problem List   Diagnosis Date Noted  . History of hyperkalemia 02/13/2016  . History of trichomonal vaginitis 09/29/2015  . Bruise 12/07/2014  . Skin lesion of back 11/15/2014  . Visit for screening mammogram 11/09/2014  . Atypical chest pain 10/21/2014  . Cataract 10/21/2014  . H/O hyperglycemia 10/21/2014  . Oral ulcer 08/31/2014  . Right leg pain 08/05/2014  . Right-sided thoracic back pain 07/07/2014  . Dyslipidemia, goal to be determined 04/22/2014  . Subcutaneous nodule 01/17/2014  . Healthcare maintenance 07/05/2013  . Lipoma 10/07/2012  . Cervical spine arthritis ([place] 08/27/2012  . History of breast cancer  07/23/2012  . GERD (gastroesophageal reflux disease) 02/03/2012  . COPD GOLD IV 06/24/2011  . Liver cyst 06/24/2011    PAYSEUR,ANGIE PTA 03/21/2016, 4:05 PM  [place] [address]Phone: [phone]  Fax:  [phone] Name: SSTEFANIA GOULARTMRN: [id number] of Birth: 908-06-1956

## 2016-03-28 ENCOUNTER — Ambulatory Visit: Payer: Medicare Other | Attending: Internal Medicine | Admitting: Physical Therapy

## 2016-03-28 ENCOUNTER — Encounter: Payer: Self-pay | Admitting: Physical Therapy

## 2016-03-28 DIAGNOSIS — M542 Cervicalgia: Secondary | ICD-10-CM | POA: Diagnosis not present

## 2016-03-28 NOTE — Therapy (Signed)
[place], [place], [address] Phone: [phone]   Fax:  [phone]  Physical Therapy Treatment  Patient Details  Name: Mary Jenkins MRN: [id number] Date of Birth: 01/09/1956 Referring Provider: Gilles Chiquito  Encounter Date: 03/28/2016      PT End of Session - 03/28/16 1726    Visit Number 7   Date for PT Re-Evaluation 05/04/16   PT Start Time 1652   PT Stop Time 2458   PT Time Calculation (min) 53 min      Past Medical History  Diagnosis Date  . Personal history of chemotherapy 1997  . GERD (gastroesophageal reflux disease) 02/03/2012  . DDD (degenerative disc disease), cervical   . Full dentures   . Exertional shortness of breath     states if she "takes her time" doing activities does not get SOB  . COPD (chronic obstructive pulmonary disease) (HCC)     no home O2  . History of breast cancer   . High cholesterol     no current med.  . Family history of adverse reaction to anesthesia     pt's sister has hx. of post-op N/V  . Cataract, immature     bilateral  . Cough 02/09/2015  . Runny nose 02/09/2015    clear drainage, per pt.    Past Surgical History  Procedure Laterality Date  . Tubal ligation  1980's  . Colonoscopy    . Mastectomy, partial Right 1997    -node dissection  . Breast capsulotomy with implant exchange Right 06/21/2013    Procedure: REVISION RIGHT BREAST RECONSTRUCTION/REMOVAL OF RIGHT IMPLANT/RIGHT BREAST CAPSULOTOMY WITH INSERT TISSUE EXPLANDER RIGHT BREAST/POSSIBLE LEFT BREAST MASTOPEXY;  Surgeon: Cristine Polio, MD;  Location: [place];  Service: Plastics;  Laterality: Right;  . Tissue expander placement Right 03/14/2014    Procedure: SALINE REMOVAL RIGHT TISSUE EXPANDER;  Surgeon: Cristine Polio, MD;  Location: [place];  Service: Plastics;  Laterality: Right;  . Breast surgery  97    implant  . Breast reconstruction  Right 06/21/2013    Procedure: BREAST RECONSTRUCTION;  Surgeon: Cristine Polio, MD;  Location: [place];  Service: Plastics;  Laterality: Right;  . Breast reconstruction Left 11/29/2013    Procedure: LEFT MASTOPEXY FOR RECONSTRUCTION;  Surgeon: Cristine Polio, MD;  Location: [place];  Service: Plastics;  Laterality: Left;  . Breast reconstruction Right 03/14/2014    Procedure: RECONSTRUCTION NIPPLE RIGHT BREAST;  Surgeon: Cristine Polio, MD;  Location: [place];  Service: Plastics;  Laterality: Right;  . Scar revision Right 02/13/2015    Procedure: SCAR REVISION RIGHT BREAST;  Surgeon: Cristine Polio, MD;  Location: [place];  Service: Plastics;  Laterality: Right;  . Removal of tissue expander and placement of implant Right 02/13/2015    Procedure: REMOVAL OF TISSUE  EXPANDER  PORT RIGHT BREAST;  Surgeon: Cristine Polio, MD;  Location: [place];  Service: Plastics;  Laterality: Right;    There were no vitals filed for this visit.      Subjective Assessment - 03/28/16 1654    Subjective doing so-so   Currently in Pain? Yes   Pain Score 5    Pain Location Neck   Pain Orientation Left;Mid                         [facility] Adult PT Treatment/Exercise - 03/28/16  0001    Neck Exercises: Machines for Strengthening   UBE (Upper Arm Bike) 37fd/2 back L 1   Other Machines for Strengthening seated row and lat pull 15 # 2 sets 10   Other Machines for Strengthening chest press 5# 2 sets 10   Neck Exercises: Theraband   Shoulder External Rotation 20 reps;Red   Neck Exercises: Standing   Other Standing Exercises 2# OH press, shld flex, bent over row and ext 2 sets 10  left arm weakness noted with ther ex   Moist Heat Therapy   Number Minutes Moist Heat 15 Minutes   Moist Heat Location Shoulder   Electrical Stimulation   Electrical Stimulation Location cerv and UT  left   Electrical  Stimulation Action IFC   Electrical Stimulation Goals Pain                  PT Short Term Goals - 03/12/16 1328    PT SHORT TERM GOAL #1   Title independent with initial HEP   Status Achieved           PT Long Term Goals - 03/21/16 1602    PT LONG TERM GOAL #1   Title understand proper psoture and body mechanics   Status Achieved   PT LONG TERM GOAL #2   Title decrease pain 50%   Status Achieved   PT LONG TERM GOAL #3   Title increase ROM of the cervical spine by 50%   Status Achieved   PT LONG TERM GOAL #4   Title report no difficulty driving   Status On-going               Plan - 03/28/16 1727    Clinical Impression Statement pt tolerated ther ex well with rest breaks needed for breathing. left arm weakness noted as fatigued quickly with ther ex as compared to right. radiating and burning only with UBE fwd on left   PT Next Visit Plan progress ther ex for UE strength and cerv stabilization      Patient will benefit from skilled therapeutic intervention in order to improve the following deficits and impairments:  Cardiopulmonary status limiting activity, Decreased activity tolerance, Decreased mobility, Decreased range of motion, Decreased strength, Increased fascial restricitons, Increased muscle spasms, Impaired perceived functional ability, Impaired flexibility, Postural dysfunction, Improper body mechanics, Pain  Visit Diagnosis: Cervicalgia     Problem List Patient Active Problem List   Diagnosis Date Noted  . History of hyperkalemia 02/13/2016  . History of trichomonal vaginitis 09/29/2015  . Bruise 12/07/2014  . Skin lesion of back 11/15/2014  . Visit for screening mammogram 11/09/2014  . Atypical chest pain 10/21/2014  . Cataract 10/21/2014  . H/O hyperglycemia 10/21/2014  . Oral ulcer 08/31/2014  . Right leg pain 08/05/2014  . Right-sided thoracic back pain 07/07/2014  . Dyslipidemia, goal to be determined 04/22/2014  .  Subcutaneous nodule 01/17/2014  . Healthcare maintenance 07/05/2013  . Lipoma 10/07/2012  . Cervical spine arthritis ([place] 08/27/2012  . History of breast cancer 07/23/2012  . GERD (gastroesophageal reflux disease) 02/03/2012  . COPD GOLD IV 06/24/2011  . Liver cyst 06/24/2011    PAYSEUR,ANGIE PTA 03/28/2016, 5:29 PM  [address] [address]Phone: [phone]  Fax:  [phone] Name: SCLOTHILDE TIPPETTSMRN: [id number] of Birth: [birth year]

## 2016-04-01 ENCOUNTER — Other Ambulatory Visit: Payer: Self-pay | Admitting: Internal Medicine

## 2016-04-01 DIAGNOSIS — Z1231 Encounter for screening mammogram for malignant neoplasm of breast: Secondary | ICD-10-CM

## 2016-04-02 ENCOUNTER — Ambulatory Visit: Payer: Medicare Other | Admitting: Physical Therapy

## 2016-04-02 DIAGNOSIS — M542 Cervicalgia: Secondary | ICD-10-CM | POA: Diagnosis not present

## 2016-04-02 NOTE — Therapy (Signed)
[place], [place], [address] Phone: [phone]   Fax:  [phone]  Physical Therapy Treatment  Patient Details  Name: Mary Jenkins MRN: [id number] Date of Birth: Oct 07, 1955 Referring Provider: Gilles Chiquito  Encounter Date: 04/02/2016      PT End of Session - 04/02/16 1047    Visit Number 8   Date for PT Re-Evaluation 05/04/16   PT Start Time 8206   PT Stop Time 1100   PT Time Calculation (min) 45 min   Activity Tolerance Patient tolerated treatment well   Behavior During Therapy [facility] for tasks assessed/performed      Past Medical History  Diagnosis Date  . Personal history of chemotherapy 1997  . GERD (gastroesophageal reflux disease) 02/03/2012  . DDD (degenerative disc disease), cervical   . Full dentures   . Exertional shortness of breath     states if she "takes her time" doing activities does not get SOB  . COPD (chronic obstructive pulmonary disease) (HCC)     no home O2  . History of breast cancer   . High cholesterol     no current med.  . Family history of adverse reaction to anesthesia     pt's sister has hx. of post-op N/V  . Cataract, immature     bilateral  . Cough 02/09/2015  . Runny nose 02/09/2015    clear drainage, per pt.    Past Surgical History  Procedure Laterality Date  . Tubal ligation  1980's  . Colonoscopy    . Mastectomy, partial Right 1997    -node dissection  . Breast capsulotomy with implant exchange Right 06/21/2013    Procedure: REVISION RIGHT BREAST RECONSTRUCTION/REMOVAL OF RIGHT IMPLANT/RIGHT BREAST CAPSULOTOMY WITH INSERT TISSUE EXPLANDER RIGHT BREAST/POSSIBLE LEFT BREAST MASTOPEXY;  Surgeon: Cristine Polio, MD;  Location: [place];  Service: Plastics;  Laterality: Right;  . Tissue expander placement Right 03/14/2014    Procedure: SALINE REMOVAL RIGHT TISSUE EXPANDER;  Surgeon: Cristine Polio, MD;  Location: [place];  Service: Plastics;  Laterality: Right;  . Breast surgery  97    implant  . Breast reconstruction Right 06/21/2013    Procedure: BREAST RECONSTRUCTION;  Surgeon: Cristine Polio, MD;  Location: [place];  Service: Plastics;  Laterality: Right;  . Breast reconstruction Left 11/29/2013    Procedure: LEFT MASTOPEXY FOR RECONSTRUCTION;  Surgeon: Cristine Polio, MD;  Location: [place];  Service: Plastics;  Laterality: Left;  . Breast reconstruction Right 03/14/2014    Procedure: RECONSTRUCTION NIPPLE RIGHT BREAST;  Surgeon: Cristine Polio, MD;  Location: [place];  Service: Plastics;  Laterality: Right;  . Scar revision Right 02/13/2015    Procedure: SCAR REVISION RIGHT BREAST;  Surgeon: Cristine Polio, MD;  Location: [place];  Service: Plastics;  Laterality: Right;  . Removal of tissue expander and placement of implant Right 02/13/2015    Procedure: REMOVAL OF TISSUE  EXPANDER  PORT RIGHT BREAST;  Surgeon: Cristine Polio, MD;  Location: [place];  Service: Plastics;  Laterality: Right;    There were no vitals filed for this visit.      Subjective Assessment - 04/02/16 1020    Subjective "I think this is helping"   Currently in Pain? No/denies   Pain Score 0-No pain  [facility] Adult PT Treatment/Exercise - 04/02/16 0001    Neck Exercises: Machines for Strengthening   UBE (Upper Arm Bike) 47fd/2 back L 2   Neck Exercises: Theraband   Shoulder ADduction 20 reps   Shoulder ADduction Limitations 1lb   Shoulder External Rotation 20 reps;Red  2 sets    Horizontal ABduction 10 reps  x2   Horizontal ABduction Limitations yellow Tband    Neck Exercises: Standing   Other Standing Exercises 2# OH press, shld flex, bent over row and ext 2 sets 10   Other Standing Exercises Tband rows red 2x15   Moist Heat Therapy   Number Minutes Moist Heat 15 Minutes    Moist Heat Location Shoulder   Electrical Stimulation   Electrical Stimulation Location cerv and UT  Left   Electrical Stimulation Action IFC   Electrical Stimulation Goals Pain                  PT Short Term Goals - 03/12/16 1328    PT SHORT TERM GOAL #1   Title independent with initial HEP   Status Achieved           PT Long Term Goals - 03/21/16 1602    PT LONG TERM GOAL #1   Title understand proper psoture and body mechanics   Status Achieved   PT LONG TERM GOAL #2   Title decrease pain 50%   Status Achieved   PT LONG TERM GOAL #3   Title increase ROM of the cervical spine by 50%   Status Achieved   PT LONG TERM GOAL #4   Title report no difficulty driving   Status On-going               Plan - 04/02/16 1047    Clinical Impression Statement Pt continues to require frequent rest breaks for breathing with exercises. Completed all exercises well but fatigues quickly. Pt reports less radiating pain on UBE.   Rehab Potential Good   PT Frequency 2x / week   PT Duration 8 weeks   PT Treatment/Interventions ADLs/Self Care Home Management;Electrical Stimulation;Moist Heat;Therapeutic exercise;Therapeutic activities;Ultrasound;Traction;Neuromuscular re-education;Patient/family education;Manual techniques   PT Next Visit Plan progress ther ex for UE strength and cerv stabilization      Patient will benefit from skilled therapeutic intervention in order to improve the following deficits and impairments:  Cardiopulmonary status limiting activity, Decreased activity tolerance, Decreased mobility, Decreased range of motion, Decreased strength, Increased fascial restricitons, Increased muscle spasms, Impaired perceived functional ability, Impaired flexibility, Postural dysfunction, Improper body mechanics, Pain  Visit Diagnosis: Cervicalgia     Problem List Patient Active Problem List   Diagnosis Date Noted  . History of hyperkalemia 02/13/2016  . History  of trichomonal vaginitis 09/29/2015  . Bruise 12/07/2014  . Skin lesion of back 11/15/2014  . Visit for screening mammogram 11/09/2014  . Atypical chest pain 10/21/2014  . Cataract 10/21/2014  . H/O hyperglycemia 10/21/2014  . Oral ulcer 08/31/2014  . Right leg pain 08/05/2014  . Right-sided thoracic back pain 07/07/2014  . Dyslipidemia, goal to be determined 04/22/2014  . Subcutaneous nodule 01/17/2014  . Healthcare maintenance 07/05/2013  . Lipoma 10/07/2012  . Cervical spine arthritis ([place] 08/27/2012  . History of breast cancer 07/23/2012  . GERD (gastroesophageal reflux disease) 02/03/2012  . COPD GOLD IV 06/24/2011  . Liver cyst 06/24/2011    RScot Jun PTA  04/02/2016, 10:51 AM  [place]  [place], [place], [address] Phone: [phone]   Fax:  [phone]  Name: Mary Jenkins MRN: [id number] Date of Birth: 1955-11-05

## 2016-04-04 ENCOUNTER — Encounter: Payer: Self-pay | Admitting: Physical Therapy

## 2016-04-04 ENCOUNTER — Ambulatory Visit: Payer: Medicare Other | Admitting: Physical Therapy

## 2016-04-04 DIAGNOSIS — M542 Cervicalgia: Secondary | ICD-10-CM

## 2016-04-04 NOTE — Therapy (Signed)
[place], [place], [address] Phone: [phone]   Fax:  [phone]  Physical Therapy Treatment  Patient Details  Name: Mary Jenkins MRN: [id number] Date of Birth: 1956-08-03 Referring Provider: Gilles Chiquito  Encounter Date: 04/04/2016      PT End of Session - 04/04/16 1601    Visit Number 9   PT Start Time 4196   PT Stop Time 1605   PT Time Calculation (min) 35 min      Past Medical History  Diagnosis Date  . Personal history of chemotherapy 1997  . GERD (gastroesophageal reflux disease) 02/03/2012  . DDD (degenerative disc disease), cervical   . Full dentures   . Exertional shortness of breath     states if she "takes her time" doing activities does not get SOB  . COPD (chronic obstructive pulmonary disease) (HCC)     no home O2  . History of breast cancer   . High cholesterol     no current med.  . Family history of adverse reaction to anesthesia     pt's sister has hx. of post-op N/V  . Cataract, immature     bilateral  . Cough 02/09/2015  . Runny nose 02/09/2015    clear drainage, per pt.    Past Surgical History  Procedure Laterality Date  . Tubal ligation  1980's  . Colonoscopy    . Mastectomy, partial Right 1997    -node dissection  . Breast capsulotomy with implant exchange Right 06/21/2013    Procedure: REVISION RIGHT BREAST RECONSTRUCTION/REMOVAL OF RIGHT IMPLANT/RIGHT BREAST CAPSULOTOMY WITH INSERT TISSUE EXPLANDER RIGHT BREAST/POSSIBLE LEFT BREAST MASTOPEXY;  Surgeon: Cristine Polio, MD;  Location: [place];  Service: Plastics;  Laterality: Right;  . Tissue expander placement Right 03/14/2014    Procedure: SALINE REMOVAL RIGHT TISSUE EXPANDER;  Surgeon: Cristine Polio, MD;  Location: [place];  Service: Plastics;  Laterality: Right;  . Breast surgery  97    implant  . Breast reconstruction Right 06/21/2013    Procedure: BREAST  RECONSTRUCTION;  Surgeon: Cristine Polio, MD;  Location: [place];  Service: Plastics;  Laterality: Right;  . Breast reconstruction Left 11/29/2013    Procedure: LEFT MASTOPEXY FOR RECONSTRUCTION;  Surgeon: Cristine Polio, MD;  Location: [place];  Service: Plastics;  Laterality: Left;  . Breast reconstruction Right 03/14/2014    Procedure: RECONSTRUCTION NIPPLE RIGHT BREAST;  Surgeon: Cristine Polio, MD;  Location: [place];  Service: Plastics;  Laterality: Right;  . Scar revision Right 02/13/2015    Procedure: SCAR REVISION RIGHT BREAST;  Surgeon: Cristine Polio, MD;  Location: [place];  Service: Plastics;  Laterality: Right;  . Removal of tissue expander and placement of implant Right 02/13/2015    Procedure: REMOVAL OF TISSUE  EXPANDER  PORT RIGHT BREAST;  Surgeon: Cristine Polio, MD;  Location: [place];  Service: Plastics;  Laterality: Right;    There were no vitals filed for this visit.      Subjective Assessment - 04/04/16 1535    Subjective running around all day, stressed so hurting   Currently in Pain? Yes   Pain Score 4    Pain Location Neck   Pain Orientation Left;Mid                         [facility] Adult PT Treatment/Exercise - 04/04/16 0001  Neck Exercises: Machines for Strengthening   UBE (Upper Arm Bike) 53fd/2 back L 2   Other Machines for Strengthening seated row and lat pull 20 # 2 sets 10   Other Machines for Strengthening chest press and OH lift 10 times   Neck Exercises: Standing   Wall Push Ups 10 reps  2 sets   Other Standing Exercises weight ball toss   Neck Exercises: Seated   Other Seated Exercise 1# fwd shld circles fwd/back 10 times   Other Seated Exercise 1# PNF 10 times                  PT Short Term Goals - 03/12/16 1328    PT SHORT TERM GOAL #1   Title independent with initial HEP   Status Achieved           PT Long  Term Goals - 04/04/16 1602    PT LONG TERM GOAL #1   Title understand proper psoture and body mechanics   Status Achieved   PT LONG TERM GOAL #2   Title decrease pain 50%   Status Achieved   PT LONG TERM GOAL #3   Title increase ROM of the cervical spine by 50%   Status Achieved   PT LONG TERM GOAL #4   Title report no difficulty driving   Status On-going               Plan - 04/04/16 1602    Clinical Impression Statement increased func strength with ther ex. pain decreasing and func increasing. No modalities as she had to leave to get phone she left at last appt   PT Next Visit Plan progress ther ex for UE strength and cerv stabilization      Patient will benefit from skilled therapeutic intervention in order to improve the following deficits and impairments:  Cardiopulmonary status limiting activity, Decreased activity tolerance, Decreased mobility, Decreased range of motion, Decreased strength, Increased fascial restricitons, Increased muscle spasms, Impaired perceived functional ability, Impaired flexibility, Postural dysfunction, Improper body mechanics, Pain  Visit Diagnosis: Cervicalgia     Problem List Patient Active Problem List   Diagnosis Date Noted  . History of hyperkalemia 02/13/2016  . History of trichomonal vaginitis 09/29/2015  . Bruise 12/07/2014  . Skin lesion of back 11/15/2014  . Visit for screening mammogram 11/09/2014  . Atypical chest pain 10/21/2014  . Cataract 10/21/2014  . H/O hyperglycemia 10/21/2014  . Oral ulcer 08/31/2014  . Right leg pain 08/05/2014  . Right-sided thoracic back pain 07/07/2014  . Dyslipidemia, goal to be determined 04/22/2014  . Subcutaneous nodule 01/17/2014  . Healthcare maintenance 07/05/2013  . Lipoma 10/07/2012  . Cervical spine arthritis ([place] 08/27/2012  . History of breast cancer 07/23/2012  . GERD (gastroesophageal reflux disease) 02/03/2012  . COPD GOLD IV 06/24/2011  . Liver cyst 06/24/2011     PAYSEUR,ANGIE PTA 04/04/2016, 4:03 PM  [place] [address]Phone: [phone]  Fax:  [phone] Name: Mary PEEDINMRN: [id number] of Birth: 924-Apr-1957

## 2016-04-05 ENCOUNTER — Ambulatory Visit (INDEPENDENT_AMBULATORY_CARE_PROVIDER_SITE_OTHER): Payer: Medicare Other | Admitting: Internal Medicine

## 2016-04-05 ENCOUNTER — Encounter: Payer: Self-pay | Admitting: Internal Medicine

## 2016-04-05 ENCOUNTER — Telehealth: Payer: Self-pay | Admitting: Internal Medicine

## 2016-04-05 VITALS — BP 114/72 | HR 88 | Temp 98.2°F | Ht 65.0 in | Wt 113.5 lb

## 2016-04-05 DIAGNOSIS — J449 Chronic obstructive pulmonary disease, unspecified: Secondary | ICD-10-CM | POA: Diagnosis present

## 2016-04-05 DIAGNOSIS — Z87891 Personal history of nicotine dependence: Secondary | ICD-10-CM | POA: Diagnosis not present

## 2016-04-05 DIAGNOSIS — M47812 Spondylosis without myelopathy or radiculopathy, cervical region: Secondary | ICD-10-CM

## 2016-04-05 DIAGNOSIS — M4692 Unspecified inflammatory spondylopathy, cervical region: Secondary | ICD-10-CM

## 2016-04-05 MED ORDER — OMEPRAZOLE 20 MG PO CPDR: 20.0000 mg | DELAYED_RELEASE_CAPSULE | Freq: Every day | ORAL | Status: DC | PRN

## 2016-04-05 MED ORDER — TIOTROPIUM BROMIDE MONOHYDRATE 2.5 MCG/ACT IN AERS: INHALATION_SPRAY | RESPIRATORY_TRACT | Status: DC

## 2016-04-05 MED ORDER — BUDESONIDE-FORMOTEROL FUMARATE 160-4.5 MCG/ACT IN AERO: 2.0000 | INHALATION_SPRAY | Freq: Two times a day (BID) | RESPIRATORY_TRACT | Status: DC

## 2016-04-05 NOTE — Assessment & Plan Note (Signed)
Assessment She is brought with her both inhalers: tiotropoum and budesonide/formeterol. She is almost out of medication and does not have the money for the copay. She called her pulmonologist's office but hadn't heard back as she was instructed by him to call if she could not refill her medications.  PIan -Called her pulmonologist office and confirmed that they would be able to provide her with samples of both inhalers today

## 2016-04-05 NOTE — Progress Notes (Signed)
   Subjective:    Patient ID: Mary Jenkins, female    DOB: 11-18-1955, 60 y.o.   MRN: [id number]  HPI Mary Jenkins is a 60 year old female who presents today for left shoulder pain. Please see assessment & plan for status of chronic medical problems.    Review of Systems     Objective:   Physical Exam  Constitutional: She is oriented to person, place, and time. She appears well-developed and well-nourished.  HENT:  Head: Normocephalic and atraumatic.  Eyes: Conjunctivae are normal. No scleral icterus.  Cardiovascular: Normal rate and regular rhythm.   Pulmonary/Chest: Effort normal. No respiratory distress.  Musculoskeletal:  Left shoulder joint without any gross deformity. Reproduction of burning pain overlying left lateral surface with the forward arc though not on the backward arc. No point tenderness noted with palpation of the left shoulder.  Neurological: She is alert and oriented to person, place, and time.  Skin: Skin is warm and dry.          Assessment & Plan:

## 2016-04-05 NOTE — Progress Notes (Signed)
Internal Medicine Clinic Attending  Case discussed with Dr. Patel,Rushil at the time of the visit.  We reviewed the resident's history and exam and pertinent patient test results.  I agree with the assessment, diagnosis, and plan of care documented in the resident's note.  

## 2016-04-05 NOTE — Patient Instructions (Addendum)
We will touch base with your lung doctor's office on your behalf for samples.   Please get your flu shot in September/October.  Let's see each other back in 6 months.

## 2016-04-05 NOTE — Telephone Encounter (Signed)
Received a call from [facility] at [facility].  She wanted to know if we had samples of Symbicort and Spiriva for the patient to pick up because patient cannot afford the medication. I left samples at front desk for patient to pick up. Regino Schultze advised patient that samples are available for pick up at our office. Logged samples into patient's medication list. Nothing further needed.

## 2016-04-05 NOTE — Assessment & Plan Note (Signed)
Assessment She reports that her pain is resolved after completing ibuprofen 800 mg to be taken every 8 hours as needed and several sessions of physical therapy, most recently 04/04/16. She does find relief with gabapentin 400 mg to be taken 3 times daily. She still has persistent burning pain of the left shoulder with specific motions, particularly when she performs a forward rotating motion.  Plan -Continue gabapentin 400 mg to be taken 3 times daily and physical therapy

## 2016-04-08 ENCOUNTER — Encounter: Payer: Self-pay | Admitting: Physical Therapy

## 2016-04-08 ENCOUNTER — Ambulatory Visit: Payer: Medicare Other | Admitting: Physical Therapy

## 2016-04-08 DIAGNOSIS — M542 Cervicalgia: Secondary | ICD-10-CM

## 2016-04-08 NOTE — Therapy (Signed)
[place], [place], [address] Phone: [phone]   Fax:  [phone]  Physical Therapy Treatment  Patient Details  Name: Mary Jenkins MRN: [id number] Date of Birth: 03-18-1956 Referring Provider: Gilles Chiquito  Encounter Date: 04/08/2016      PT End of Session - 04/08/16 0943    Visit Number 10   Date for PT Re-Evaluation 05/04/16   PT Start Time 0917   PT Stop Time 1012   PT Time Calculation (min) 55 min   Activity Tolerance Patient tolerated treatment well   Behavior During Therapy [facility] for tasks assessed/performed      Past Medical History  Diagnosis Date  . Personal history of chemotherapy 1997  . GERD (gastroesophageal reflux disease) 02/03/2012  . DDD (degenerative disc disease), cervical   . Full dentures   . Exertional shortness of breath     states if she "takes her time" doing activities does not get SOB  . COPD (chronic obstructive pulmonary disease) (HCC)     no home O2  . History of breast cancer   . High cholesterol     no current med.  . Family history of adverse reaction to anesthesia     pt's sister has hx. of post-op N/V  . Cataract, immature     bilateral  . Cough 02/09/2015  . Runny nose 02/09/2015    clear drainage, per pt.    Past Surgical History  Procedure Laterality Date  . Tubal ligation  1980's  . Colonoscopy    . Mastectomy, partial Right 1997    -node dissection  . Breast capsulotomy with implant exchange Right 06/21/2013    Procedure: REVISION RIGHT BREAST RECONSTRUCTION/REMOVAL OF RIGHT IMPLANT/RIGHT BREAST CAPSULOTOMY WITH INSERT TISSUE EXPLANDER RIGHT BREAST/POSSIBLE LEFT BREAST MASTOPEXY;  Surgeon: Cristine Polio, MD;  Location: [place];  Service: Plastics;  Laterality: Right;  . Tissue expander placement Right 03/14/2014    Procedure: SALINE REMOVAL RIGHT TISSUE EXPANDER;  Surgeon: Cristine Polio, MD;  Location: [place];  Service: Plastics;  Laterality: Right;  . Breast surgery  97    implant  . Breast reconstruction Right 06/21/2013    Procedure: BREAST RECONSTRUCTION;  Surgeon: Cristine Polio, MD;  Location: [place];  Service: Plastics;  Laterality: Right;  . Breast reconstruction Left 11/29/2013    Procedure: LEFT MASTOPEXY FOR RECONSTRUCTION;  Surgeon: Cristine Polio, MD;  Location: [place];  Service: Plastics;  Laterality: Left;  . Breast reconstruction Right 03/14/2014    Procedure: RECONSTRUCTION NIPPLE RIGHT BREAST;  Surgeon: Cristine Polio, MD;  Location: [place];  Service: Plastics;  Laterality: Right;  . Scar revision Right 02/13/2015    Procedure: SCAR REVISION RIGHT BREAST;  Surgeon: Cristine Polio, MD;  Location: [place];  Service: Plastics;  Laterality: Right;  . Removal of tissue expander and placement of implant Right 02/13/2015    Procedure: REMOVAL OF TISSUE  EXPANDER  PORT RIGHT BREAST;  Surgeon: Cristine Polio, MD;  Location: [place];  Service: Plastics;  Laterality: Right;    There were no vitals filed for this visit.      Subjective Assessment - 04/08/16 0920    Subjective I am feeling a little better, pain is still mostly in the left shoulder area   Currently in Pain? Yes   Pain Score 2    Pain Location Shoulder   Pain Orientation Left  Pain Descriptors / Indicators Spasm;Tightness   Aggravating Factors  stress   Pain Relieving Factors the treatment really helps a lot                         [facility] Adult PT Treatment/Exercise - 04-18-2016 0001    Neck Exercises: Machines for Strengthening   UBE (Upper Arm Bike) 78fd/2 back L 2   [employer] for Upper Extremity Motion NuStep Level 4 x 5 minutes   Other Machines for Strengthening seated row and lat pull 20 # 2 sets 10   Other Machines for Strengthening chest press and OH lift 10 times   Neck Exercises:  Theraband   Scapula Retraction 20 reps;Red   Shoulder External Rotation 20 reps;Red   Horizontal ABduction 10 reps   Horizontal ABduction Limitations yellow Tband    Neck Exercises: Standing   Wall Push Ups 20 reps   Other Standing Exercises weight ball toss and overhead press   Other Standing Exercises Tband rows red 2x15   Neck Exercises: Seated   Other Seated Exercise 1# fwd shld circles fwd/back 10 times with upper trap and leveator stretches with 4#   Moist Heat Therapy   Number Minutes Moist Heat 15 Minutes   Moist Heat Location Shoulder   Electrical Stimulation   Electrical Stimulation Location left shoulder area   Electrical Stimulation Action IFC   Electrical Stimulation Goals Pain                  PT Short Term Goals - 03/12/16 1328    PT SHORT TERM GOAL #1   Title independent with initial HEP   Status Achieved           PT Long Term Goals - 04/04/16 1602    PT LONG TERM GOAL #1   Title understand proper psoture and body mechanics   Status Achieved   PT LONG TERM GOAL #2   Title decrease pain 50%   Status Achieved   PT LONG TERM GOAL #3   Title increase ROM of the cervical spine by 50%   Status Achieved   PT LONG TERM GOAL #4   Title report no difficulty driving   Status On-going               Plan - 007/27/20170950    Clinical Impression Statement I added red tband an green trand for her at home.  She struggles mostly with her breathing and the accessory mms working all of the time do not help the tension and spasms in the neck area, her ROM was very good and had a 20 point improvement in her Foto score.   PT Next Visit Plan progress ther ex for UE strength and cerv stabilization   Consulted and Agree with Plan of Care Patient      Patient will benefit from skilled therapeutic intervention in order to improve the following deficits and impairments:  Cardiopulmonary status limiting activity, Decreased activity tolerance, Decreased  mobility, Decreased range of motion, Decreased strength, Increased fascial restricitons, Increased muscle spasms, Impaired perceived functional ability, Impaired flexibility, Postural dysfunction, Improper body mechanics, Pain  Visit Diagnosis: Cervicalgia       G-Codes - [id number]    Functional Assessment Tool Used foto 41% limitation   Functional Limitation Self care   Self Care Current Status ((Q1194 At least 40 percent but less than 60 percent impaired, limited or restricted   Self Care Goal Status (([id number] At  least 40 percent but less than 60 percent impaired, limited or restricted      Problem List Patient Active Problem List   Diagnosis Date Noted  . History of hyperkalemia 02/13/2016  . History of trichomonal vaginitis 09/29/2015  . Skin lesion of back 11/15/2014  . Visit for screening mammogram 11/09/2014  . Atypical chest pain 10/21/2014  . Cataract 10/21/2014  . H/O hyperglycemia 10/21/2014  . Oral ulcer 08/31/2014  . Right leg pain 08/05/2014  . Dyslipidemia, goal to be determined 04/22/2014  . Subcutaneous nodule 01/17/2014  . Healthcare maintenance 07/05/2013  . Lipoma 10/07/2012  . Cervical spine arthritis ([place]) 08/27/2012  . History of breast cancer 07/23/2012  . GERD (gastroesophageal reflux disease) 02/03/2012  . COPD GOLD IV 06/24/2011  . Liver cyst 06/24/2011    Sumner Boast., PT 04/08/2016, 9:52 AM  [place] [address] [place], [place], [address] Phone: [phone]   Fax:  [phone]  Name: Mary Jenkins MRN: [id number] Date of Birth: [birth year]

## 2016-04-11 ENCOUNTER — Encounter: Payer: Self-pay | Admitting: Physical Therapy

## 2016-04-11 ENCOUNTER — Ambulatory Visit: Payer: Medicare Other | Admitting: Physical Therapy

## 2016-04-11 DIAGNOSIS — M542 Cervicalgia: Secondary | ICD-10-CM

## 2016-04-11 NOTE — Therapy (Signed)
[place], [place], [address] Phone: [phone]   Fax:  [phone]  Physical Therapy Treatment  Patient Details  Name: Mary Jenkins MRN: [id number] Date of Birth: 1956-06-01 Referring Provider: Gilles Chiquito  Encounter Date: 04/11/2016      PT End of Session - 04/11/16 1507    Visit Number 11   Date for PT Re-Evaluation 05/04/16   PT Start Time 1430   PT Stop Time 1520   PT Time Calculation (min) 50 min   Activity Tolerance Patient tolerated treatment well   Behavior During Therapy [facility] for tasks assessed/performed      Past Medical History  Diagnosis Date  . Personal history of chemotherapy 1997  . GERD (gastroesophageal reflux disease) 02/03/2012  . DDD (degenerative disc disease), cervical   . Full dentures   . Exertional shortness of breath     states if she "takes her time" doing activities does not get SOB  . COPD (chronic obstructive pulmonary disease) (HCC)     no home O2  . History of breast cancer   . High cholesterol     no current med.  . Family history of adverse reaction to anesthesia     pt's sister has hx. of post-op N/V  . Cataract, immature     bilateral  . Cough 02/09/2015  . Runny nose 02/09/2015    clear drainage, per pt.    Past Surgical History  Procedure Laterality Date  . Tubal ligation  1980's  . Colonoscopy    . Mastectomy, partial Right 1997    -node dissection  . Breast capsulotomy with implant exchange Right 06/21/2013    Procedure: REVISION RIGHT BREAST RECONSTRUCTION/REMOVAL OF RIGHT IMPLANT/RIGHT BREAST CAPSULOTOMY WITH INSERT TISSUE EXPLANDER RIGHT BREAST/POSSIBLE LEFT BREAST MASTOPEXY;  Surgeon: Cristine Polio, MD;  Location: [place];  Service: Plastics;  Laterality: Right;  . Tissue expander placement Right 03/14/2014    Procedure: SALINE REMOVAL RIGHT TISSUE EXPANDER;  Surgeon: Cristine Polio, MD;  Location: [place];  Service: Plastics;  Laterality: Right;  . Breast surgery  97    implant  . Breast reconstruction Right 06/21/2013    Procedure: BREAST RECONSTRUCTION;  Surgeon: Cristine Polio, MD;  Location: [place];  Service: Plastics;  Laterality: Right;  . Breast reconstruction Left 11/29/2013    Procedure: LEFT MASTOPEXY FOR RECONSTRUCTION;  Surgeon: Cristine Polio, MD;  Location: [place];  Service: Plastics;  Laterality: Left;  . Breast reconstruction Right 03/14/2014    Procedure: RECONSTRUCTION NIPPLE RIGHT BREAST;  Surgeon: Cristine Polio, MD;  Location: [place];  Service: Plastics;  Laterality: Right;  . Scar revision Right 02/13/2015    Procedure: SCAR REVISION RIGHT BREAST;  Surgeon: Cristine Polio, MD;  Location: [place];  Service: Plastics;  Laterality: Right;  . Removal of tissue expander and placement of implant Right 02/13/2015    Procedure: REMOVAL OF TISSUE  EXPANDER  PORT RIGHT BREAST;  Surgeon: Cristine Polio, MD;  Location: [place];  Service: Plastics;  Laterality: Right;    There were no vitals filed for this visit.      Subjective Assessment - 04/11/16 1428    Subjective "Things are going good"   Currently in Pain? No/denies   Pain Score 0-No pain  [facility] Adult PT Treatment/Exercise - 04/11/16 0001    Neck Exercises: Machines for Strengthening   UBE (Upper Arm Bike) 32fd/3 back L 2   Other Machines for Strengthening seated row and lat pull 20 # 2 sets 10   Other Machines for Strengthening chest press 10lb 2x10   Neck Exercises: Theraband   Horizontal ABduction 10 reps   Horizontal ABduction Limitations yellow Tband    Neck Exercises: Standing   Wall Push Ups 20 reps   Other Standing Exercises weight ball toss and overhead press   Other Standing Exercises Tband rows red 2x15   Moist Heat Therapy   Number Minutes Moist Heat 15  Minutes   Moist Heat Location Shoulder   Electrical Stimulation   Electrical Stimulation Location left shoulder area   Electrical Stimulation Action IFC   Electrical Stimulation Goals Pain                  PT Short Term Goals - 03/12/16 1328    PT SHORT TERM GOAL #1   Title independent with initial HEP   Status Achieved           PT Long Term Goals - 04/11/16 1430    PT LONG TERM GOAL #1   Title understand proper psoture and body mechanics   Status Achieved   PT LONG TERM GOAL #2   Title decrease pain 50%   Status Achieved   PT LONG TERM GOAL #3   Title increase ROM of the cervical spine by 50%   Status Achieved   PT LONG TERM GOAL #4   Title report no difficulty driving   Status Achieved               Plan - 04/11/16 1507    Clinical Impression Statement Pt has progressed and completed all goals. Continues to struggle with he breathing evident by use of accessory muscles. No reports of increase pain with today's exercises but does fatigue quickly and requires lengthy rest periods. Reports that she feels better overall  and agrees to be placed on 2 week hold. Pt reports that she will work with her bands at home.   Rehab Potential Good   PT Frequency 2x / week   PT Duration 8 weeks   PT Treatment/Interventions ADLs/Self Care Home Management;Electrical Stimulation;Moist Heat;Therapeutic exercise;Therapeutic activities;Ultrasound;Traction;Neuromuscular re-education;Patient/family education;Manual techniques   PT Next Visit Plan Spoke with lead PT, will place Pt on 2 week hold. Pt has been instructed to call and schedule more appointments if function declines. Will assume D/C if she has not called within two weeks.      Patient will benefit from skilled therapeutic intervention in order to improve the following deficits and impairments:  Cardiopulmonary status limiting activity, Decreased activity tolerance, Decreased mobility, Decreased range of motion,  Decreased strength, Increased fascial restricitons, Increased muscle spasms, Impaired perceived functional ability, Impaired flexibility, Postural dysfunction, Improper body mechanics, Pain  Visit Diagnosis: Cervicalgia     Problem List Patient Active Problem List   Diagnosis Date Noted  . History of hyperkalemia 02/13/2016  . History of trichomonal vaginitis 09/29/2015  . Skin lesion of back 11/15/2014  . Visit for screening mammogram 11/09/2014  . Atypical chest pain 10/21/2014  . Cataract 10/21/2014  . H/O hyperglycemia 10/21/2014  . Oral ulcer 08/31/2014  . Right leg pain 08/05/2014  . Dyslipidemia, goal to be determined 04/22/2014  . Subcutaneous nodule 01/17/2014  . Healthcare maintenance 07/05/2013  . Lipoma 10/07/2012  . Cervical spine arthritis (  [place]) 08/27/2012  . History of breast cancer 07/23/2012  . GERD (gastroesophageal reflux disease) 02/03/2012  . COPD GOLD IV 06/24/2011  . Liver cyst 06/24/2011    Scot Jun, PTA  04/11/2016, 3:11 PM  [place], [place], [address] Phone: [phone]   Fax:  [phone]  Name: Mary Jenkins MRN: [id number] Date of Birth: 01/07/1956

## 2016-04-16 ENCOUNTER — Ambulatory Visit: Payer: Medicare Other | Admitting: Physical Therapy

## 2016-04-17 ENCOUNTER — Ambulatory Visit: Payer: Medicare Other | Admitting: Physical Therapy

## 2016-04-23 ENCOUNTER — Other Ambulatory Visit: Payer: Self-pay | Admitting: Internal Medicine

## 2016-04-23 DIAGNOSIS — M47812 Spondylosis without myelopathy or radiculopathy, cervical region: Secondary | ICD-10-CM

## 2016-04-23 NOTE — Telephone Encounter (Signed)
As this patient was seen by me and deemed necessary for their treatment, I will refill this prescription for gabapentin.  

## 2016-05-02 ENCOUNTER — Ambulatory Visit
Admission: RE | Admit: 2016-05-02 | Discharge: 2016-05-02 | Disposition: A | Discharge status: Home / Self Care | Payer: Medicare Other | Source: Ambulatory Visit | Attending: Cardiology | Admitting: Cardiology

## 2016-05-02 DIAGNOSIS — Z1231 Encounter for screening mammogram for malignant neoplasm of breast: Secondary | ICD-10-CM | POA: Diagnosis not present

## 2016-05-31 ENCOUNTER — Ambulatory Visit (INDEPENDENT_AMBULATORY_CARE_PROVIDER_SITE_OTHER): Payer: Medicare Other | Admitting: Internal Medicine

## 2016-05-31 ENCOUNTER — Encounter: Payer: Self-pay | Admitting: Internal Medicine

## 2016-05-31 VITALS — BP 112/68 | HR 77 | Ht 66.0 in | Wt 114.0 lb

## 2016-05-31 DIAGNOSIS — J449 Chronic obstructive pulmonary disease, unspecified: Secondary | ICD-10-CM | POA: Diagnosis not present

## 2016-05-31 DIAGNOSIS — Z23 Encounter for immunization: Secondary | ICD-10-CM | POA: Diagnosis not present

## 2016-05-31 NOTE — Patient Instructions (Signed)
Please see patient coordinator before you leave today  to schedule pulmonary rehab  No change in medications  Please schedule a follow up visit in 6  months but call sooner if needed

## 2016-05-31 NOTE — Assessment & Plan Note (Addendum)
PFT performed on 06/27/2011. Poor quality.    - FEV1 0.39 ( 16%) ratio 23 and 16% better p B2    - 03/13/2012  Inhaler technique 90% with dpi but only 50% with mdi > 50% 04/22/12   - Alpha one genotype 02/13/12 > MM   - 03/22/2013  PFT's 0.56 ( 24%) ratio 33 but 12% better improvement and DLCO 21 corrects to 28%    started Incruse 11/11/14 p spiriva off formulary> d/c 02/28/2015 due to cough  - changed incruse to spiriva respimat 02/28/2015 due to cough   - Spirometry 05/31/2016  FEV1 0.52 (23%)  Ratio 32    - 05/31/2016  After extensive coaching HFA effectiveness =    90%  - referred to rehab 05/31/2016 >>>    very severe copd and yet overall improved but still Group D in terms of symptom/risk and laba/lama/ICS  therefore appropriate rx at this point though if remains free of exac could consider laba/lama only and leave off ics  For now main emphasis should be on mobilizing/ pacing so rehab appropriate  I had an extended discussion with the patient reviewing all relevant studies completed to date and  lasting 15 to 20 minutes of a 25 minute visit    Each maintenance medication was reviewed in detail including most importantly the difference between maintenance and prns and under what circumstances the prns are to be triggered using an action plan format that is not reflected in the computer generated alphabetically organized AVS.    Please see instructions for details which were reviewed in writing and the patient given a copy highlighting the part that I personally wrote and discussed at today's ov.
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
## 2016-06-20 ENCOUNTER — Ambulatory Visit (INDEPENDENT_AMBULATORY_CARE_PROVIDER_SITE_OTHER): Payer: Medicare Other | Admitting: Internal Medicine

## 2016-06-20 VITALS — BP 106/97 | HR 82 | Temp 98.3°F | Ht 65.5 in | Wt 112.2 lb

## 2016-06-20 DIAGNOSIS — G4700 Insomnia, unspecified: Secondary | ICD-10-CM

## 2016-06-20 DIAGNOSIS — R112 Nausea with vomiting, unspecified: Secondary | ICD-10-CM | POA: Diagnosis present

## 2016-06-20 DIAGNOSIS — Z87891 Personal history of nicotine dependence: Secondary | ICD-10-CM | POA: Diagnosis not present

## 2016-06-20 DIAGNOSIS — R1084 Generalized abdominal pain: Secondary | ICD-10-CM | POA: Diagnosis not present

## 2016-06-20 NOTE — Patient Instructions (Addendum)
We suspect that you have a viral gastroenteritis causing your symptoms.   There is no one medication to treat this.   We recommend staying well-hydrated and get plenty of rest. Take things slow when returning to a normal diet, start with liquids, smoothies, and soft foods. You stomach is upset and needs some time to rest and recover before it will tolerate a full diet.  To help your sleep you can try taking Melatonin 5 to 10 mg - 30 minutes before bedtime.  Here are some other strategies to help with sleep:  Avoid napping during the day. It can disturb the normal pattern of sleep and wakefulness.  Avoid stimulants such as caffeine, nicotine, and alcohol too close to bedtime. While alcohol is well known to speed the onset of sleep, it disrupts sleep in the second half as the body begins to metabolize the alcohol, causing arousal.  Exercise can promote good sleep. Vigorous exercise should be taken in the morning or late afternoon. A relaxing exercise, like yoga, can be done before bed to help initiate a restful night's sleep. Food can be disruptive right before sleep. Stay away from large meals close to bedtime. Also dietary changes can cause sleep problems, if someone is struggling with a sleep problem, it's not a good time to start experimenting with spicy dishes. And, remember, chocolate has caffeine.  Ensure adequate exposure to natural light. This is particularly important for older people who may not venture outside as frequently as children and adults. Light exposure helps maintain a healthy sleep-wake cycle.  Establish a regular relaxing bedtime routine. Try to avoid emotionally upsetting conversations and activities before trying to go to sleep. Don't dwell on, or bring your problems to bed.  Associate your bed with sleep. It's not a good idea to use your bed to watch TV, listen to the radio, or read.  Make sure that the sleep environment is pleasant and relaxing. The bed should be  comfortable, the room should not be too hot or cold, or too bright.

## 2016-06-20 NOTE — Progress Notes (Signed)
   CC: Nausea, vomiting, and indigestion  HPI:  Mary Jenkins is a 60 y.o. female with PMHx detailed below presenting with nausea, vomiting, and indigestion for the past 2 days. She also endorses longstanding difficulties with sleeping at night - states that the previous prescription she got from her MD did not help.  See problem based assessment and plan below for additional details.  Past Medical History:  Diagnosis Date  . Cataract, immature    bilateral  . COPD (chronic obstructive pulmonary disease) ([place])    no home O2  . Cough 02/09/2015  . DDD (degenerative disc disease), cervical   . Exertional shortness of breath    states if she "takes her time" doing activities does not get SOB  . Family history of adverse reaction to anesthesia    pt's sister has hx. of post-op N/V  . Full dentures   . GERD (gastroesophageal reflux disease) 02/03/2012  . High cholesterol    no current med.  Marland Kitchen History of breast cancer   . Personal history of chemotherapy 1997  . Runny nose 02/09/2015   clear drainage, per pt.    Review of Systems: Review of Systems  Constitutional: Positive for malaise/fatigue. Negative for chills and fever.  Respiratory: Negative for shortness of breath.   Cardiovascular: Negative for chest pain.  Gastrointestinal: Positive for abdominal pain, nausea and vomiting. Negative for blood in stool, constipation and diarrhea.  Genitourinary: Negative for dysuria.  Musculoskeletal: Negative for myalgias.  Neurological: Negative for dizziness.     Physical Exam: Vitals:   06/20/16 1008  BP: (!) 106/97  Pulse: 82  Temp: 98.3 F (36.8 C)  TempSrc: Oral  SpO2: 95%  Weight: 112 lb 3.2 oz (50.9 kg)  Height: 5' 5.5" (1.664 m)   Body mass index is 18.39 kg/m. GENERAL- Thin well-dressed woman sitting comfortably in exam room chair wrapped in blanket, alert, in no distress HEENT- Atraumatic, moist mucous membranes CARDIAC- Regular rate and rhythm, no murmurs, rubs  or gallops. RESP- Clear to ascultation bilaterally, no wheezing or crackles, normal work of breathing ABDOMEN- Normoactive bowel sounds, soft, mild generalized tenderness to palpation, nondistended EXTREMITIES- Normal bulk and range of motion, no edema, 2+ peripheral pulses SKIN- Warm, dry, intact, without visible rash PSYCH- Appropriate affect, clear speech, thoughts linear and goal-directed  Assessment & Plan:   See encounters tab for problem based medical decision making.  Patient seen with Dr. Dareen Piano

## 2016-06-20 NOTE — Assessment & Plan Note (Signed)
Reports indigestion and nausea developed two days ago (Tuesday morning), followed by vomiting and inability to keep food down. On Wednesday her appetite and nausea improved and she ate a large fried chicken dinner, but then proceeded to vomit this up last night and this morning. Her vomit is non-bilious non-bloody, denies diarrhea/constipation, denies hx abdominal surgeries, and reports eating [employer] burger on Monday night. She denies sick contacts, fevers/chills, but does endorse malaise. Exam benign with mild abdominal tenderness.  Assessment: viral gastroenteritis vs food-borne illness  Plan: - Recommended supportive care, encouraged good fluid intake and slowly advance diet from liquids to thick liquids to soft foods with avoidance of large meals and fatty foods right away

## 2016-06-20 NOTE — Assessment & Plan Note (Signed)
Endorsing longstanding insomnia. Has not tried Melatonin - advised her to find this OTC and take 30 minutes before bedtime. Also provided list of sleep hygiene tips.

## 2016-06-24 ENCOUNTER — Telehealth: Payer: Self-pay | Admitting: Internal Medicine

## 2016-06-24 NOTE — Telephone Encounter (Signed)
sats were fine at rest so ok to fly [employer]

## 2016-06-24 NOTE — Telephone Encounter (Signed)
lmomtcb x 1

## 2016-06-24 NOTE — Telephone Encounter (Signed)
Spoke with pt. She is aware of MW's recommendation. Nothing further was needed.

## 2016-06-24 NOTE — Telephone Encounter (Signed)
Spoke with pt. She wants to know if MW thinks it's okay for her to fly with COPD.  MW - please advise. Thanks.

## 2016-06-24 NOTE — Progress Notes (Signed)
Internal Medicine Clinic Attending  I saw and evaluated the patient.  I personally confirmed the key portions of the history and exam documented by Dr. Wynetta Emery and I reviewed pertinent patient test results.  The assessment, diagnosis, and plan were formulated together and I agree with the documentation in the resident's note.

## 2016-07-10 ENCOUNTER — Telehealth: Payer: Self-pay | Admitting: Internal Medicine

## 2016-07-10 MED ORDER — AZITHROMYCIN 250 MG PO TABS: ORAL_TABLET | ORAL | 0 refills | Status: AC

## 2016-07-10 NOTE — Telephone Encounter (Signed)
rx sent to preferred pharmacy. Pt aware of MW recommendations and voiced understanding. Nothing further needed.

## 2016-07-10 NOTE — Telephone Encounter (Signed)
Called and spoke with pt and she stated that a couple of days ago she started with head and chest congestion.  Cough with yellow sputum.  She stated that she took some alkaseltzer day time and this helped with her head congestion, but her chest is still tight and she is still having the productive cough.  Pt is requesting that something be called in for her.  MW please advise. Thanks  No Known Allergies   Last ov--05/31/16

## 2016-07-10 NOTE — Telephone Encounter (Signed)
Zpak, f/u by 10/20 afternoon  if not improving

## 2016-07-30 ENCOUNTER — Telehealth: Payer: Self-pay | Admitting: Internal Medicine

## 2016-07-30 NOTE — Telephone Encounter (Signed)
lmomtcb x 1

## 2016-07-30 NOTE — Telephone Encounter (Signed)
Patient called back to give correct airlines - It is [employer]. Patient states forms need to be faxed by Friday 08/02/16-pr

## 2016-07-30 NOTE — Telephone Encounter (Signed)
Pt returning call.Mary Jenkins ° °

## 2016-07-30 NOTE — Telephone Encounter (Signed)
Spoke with pt, checking on the status of forms faxed from [employer] clearing her to fly without wearing her 02.  Spoke with MW about this, who has not yet received any paperwork on this patient. Pt aware of this, advised her to have forms refaxed.  She will call and have forms refaxed.  Will await fax.

## 2016-07-31 ENCOUNTER — Telehealth: Payer: Self-pay | Admitting: Internal Medicine

## 2016-07-31 NOTE — Telephone Encounter (Signed)
Spoke with pt who states these forms need to completed today and faxed back to airlines as she is leaving this weekend. Pt also request that we call her as well after forms have been complete.  Forms have been placed on Leslie's deck.  Nothing further needed.

## 2016-07-31 NOTE — Telephone Encounter (Signed)
I looked at the forms  The form has the wrong pt's name typed on it  Beside pt's name it has Mary Jenkins Pt states this is her friend  I advised we will not be filling this out under the wrong name  I have shredded forms and she will have them refax or drop off new form with the correct name

## 2016-07-31 NOTE — Telephone Encounter (Signed)
signed

## 2016-07-31 NOTE — Telephone Encounter (Signed)
Pt returned phone call about the fax being refax. Pt left info: [employer]- ph [phone] (RDUURY-letters to locate record). Pt states will call and have them refax form. Mearl Latin

## 2016-07-31 NOTE — Telephone Encounter (Signed)
New forms received. Forms placed in MW folder at the front area, pt states the forms needs to be faxed back to [employer].Mearl Latin

## 2016-07-31 NOTE — Telephone Encounter (Signed)
Spoke with pt, advised that we received these forms 2 hours ago, and that they have been given to MW and LR to fill out. Pt was adamant that these be filled out and faxed back today.  Pt requesting a phone call when these forms are faxed.  MW please advise when forms have been filled out.  Thanks.

## 2016-07-31 NOTE — Telephone Encounter (Signed)
Will await forms to be faxed.

## 2016-07-31 NOTE — Telephone Encounter (Signed)
Form completed, faxed, and placed in MW's scan folder  Pt aware and states nothing further needed

## 2016-07-31 NOTE — Telephone Encounter (Signed)
Received forms off the fax. Pt waiting in the lobby.Mary Jenkins

## 2016-07-31 NOTE — Telephone Encounter (Signed)
Pt calling back to check on status of forms and and need them faxed back today please advise.Hillery Hunter

## 2016-07-31 NOTE — Telephone Encounter (Signed)
Mary Jenkins the pt has dropped off new forms up front to be completed and faxed back to [employer].  Will forward to leslie to follow up on these forms.

## 2016-08-01 ENCOUNTER — Telehealth: Payer: Self-pay | Admitting: Internal Medicine

## 2016-08-01 NOTE — Telephone Encounter (Signed)
Spoke with pt. Pt requesting the original copy for the airlines. Copy made for pt's records. Original copy given to pt. Nothing further needed at this time.

## 2016-09-12 ENCOUNTER — Ambulatory Visit (INDEPENDENT_AMBULATORY_CARE_PROVIDER_SITE_OTHER): Payer: Medicare Other | Admitting: Obstetrics

## 2016-09-12 ENCOUNTER — Encounter: Payer: Self-pay | Admitting: Obstetrics

## 2016-09-12 ENCOUNTER — Other Ambulatory Visit (HOSPITAL_COMMUNITY)
Admission: RE | Admit: 2016-09-12 | Discharge: 2016-09-12 | Disposition: A | Discharge status: Home / Self Care | Payer: Medicare Other | Source: Ambulatory Visit | Attending: Obstetrics | Admitting: Obstetrics

## 2016-09-12 VITALS — BP 102/74 | HR 74 | Ht 66.0 in | Wt 119.5 lb

## 2016-09-12 DIAGNOSIS — Z1151 Encounter for screening for human papillomavirus (HPV): Secondary | ICD-10-CM | POA: Diagnosis present

## 2016-09-12 DIAGNOSIS — Z01419 Encounter for gynecological examination (general) (routine) without abnormal findings: Secondary | ICD-10-CM

## 2016-09-12 DIAGNOSIS — Z0000 Encounter for general adult medical examination without abnormal findings: Secondary | ICD-10-CM

## 2016-09-12 DIAGNOSIS — Z124 Encounter for screening for malignant neoplasm of cervix: Secondary | ICD-10-CM

## 2016-09-12 NOTE — Progress Notes (Signed)
Subjective:        Mary Jenkins is a 60 y.o. female here for a routine exam.  Current complaints: None.    Personal health questionnaire:  Is patient Ashkenazi Jewish, have a family history of breast and/or ovarian cancer: no Is there a family history of uterine cancer diagnosed at age < 38, gastrointestinal cancer, urinary tract cancer, family member who is a Field seismologist syndrome-associated carrier: no Is the patient overweight and hypertensive, family history of diabetes, personal history of gestational diabetes, preeclampsia or PCOS: no Is patient over 49, have PCOS,  family history of premature CHD under age 39, diabetes, smoke, have hypertension or peripheral artery disease:  no At any time, has a partner hit, kicked or otherwise hurt or frightened you?: no Over the past 2 weeks, have you felt down, depressed or hopeless?: no Over the past 2 weeks, have you felt little interest or pleasure in doing things?:no   Gynecologic History No LMP recorded. Patient is postmenopausal. Contraception: post menopausal status Last Pap: 2014. Results were: normal Last mammogram: 2016. Results were: normal  Obstetric History OB History  No data available    Past Medical History:  Diagnosis Date  . Cataract, immature    bilateral  . COPD (chronic obstructive pulmonary disease) ([place])    no home O2  . Cough 02/09/2015  . DDD (degenerative disc disease), cervical   . Exertional shortness of breath    states if she "takes her time" doing activities does not get SOB  . Family history of adverse reaction to anesthesia    pt's sister has hx. of post-op N/V  . Full dentures   . GERD (gastroesophageal reflux disease) 02/03/2012  . High cholesterol    no current med.  Marland Kitchen History of breast cancer   . Personal history of chemotherapy 1997  . Runny nose 02/09/2015   clear drainage, per pt.    Past Surgical History:  Procedure Laterality Date  . BREAST CAPSULOTOMY WITH IMPLANT EXCHANGE Right  06/21/2013   Procedure: REVISION RIGHT BREAST RECONSTRUCTION/REMOVAL OF RIGHT IMPLANT/RIGHT BREAST CAPSULOTOMY WITH INSERT TISSUE EXPLANDER RIGHT BREAST/POSSIBLE LEFT BREAST MASTOPEXY;  Surgeon: Cristine Polio, MD;  Location: [facility];  Service: Plastics;  Laterality: Right;  . BREAST RECONSTRUCTION Right 06/21/2013   Procedure: BREAST RECONSTRUCTION;  Surgeon: Cristine Polio, MD;  Location: [facility];  Service: Plastics;  Laterality: Right;  . BREAST RECONSTRUCTION Left 11/29/2013   Procedure: LEFT MASTOPEXY FOR RECONSTRUCTION;  Surgeon: Cristine Polio, MD;  Location: [facility];  Service: Plastics;  Laterality: Left;  . BREAST RECONSTRUCTION Right 03/14/2014   Procedure: RECONSTRUCTION NIPPLE RIGHT BREAST;  Surgeon: Cristine Polio, MD;  Location: [facility];  Service: Plastics;  Laterality: Right;  . BREAST SURGERY  97   implant  . COLONOSCOPY    . MASTECTOMY, PARTIAL Right 1997   -node dissection  . REMOVAL OF TISSUE EXPANDER AND PLACEMENT OF IMPLANT Right 02/13/2015   Procedure: REMOVAL OF TISSUE  EXPANDER  PORT RIGHT BREAST;  Surgeon: Cristine Polio, MD;  Location: [facility];  Service: Plastics;  Laterality: Right;  . SCAR REVISION Right 02/13/2015   Procedure: SCAR REVISION RIGHT BREAST;  Surgeon: Cristine Polio, MD;  Location: [facility];  Service: Plastics;  Laterality: Right;  . TISSUE EXPANDER PLACEMENT Right 03/14/2014   Procedure: SALINE REMOVAL RIGHT TISSUE EXPANDER;  Surgeon: Cristine Polio, MD;  Location: [facility];  Service: Plastics;  Laterality: Right;  .  TUBAL LIGATION  1980's     Current Outpatient Prescriptions:  .  albuterol (PROVENTIL HFA;VENTOLIN HFA) 108 (90 Base) MCG/ACT inhaler, Inhale 2 puffs into the lungs every 6 (six) hours as needed for wheezing., Disp: 3 Inhaler, Rfl: 12 .  albuterol (PROVENTIL) (2.5 MG/3ML) 0.083% nebulizer solution, Take 3 mLs  (2.5 mg total) by nebulization every 6 (six) hours as needed for wheezing. For wheezing, Disp: 1620 mL, Rfl: 3 .  budesonide-formoterol (SYMBICORT) 160-4.5 MCG/ACT inhaler, Inhale 2 puffs into the lungs 2 (two) times daily., Disp: 2 Inhaler, Rfl: 0 .  gabapentin (NEURONTIN) 400 MG capsule, TAKE ONE CAPSULE BY MOUTH THREE TIMES DAILY, Disp: 90 capsule, Rfl: 3 .  RESTASIS 0.05 % ophthalmic emulsion, Place into both eyes 2 (two) times daily., Disp: , Rfl:  .  Tiotropium Bromide Monohydrate (SPIRIVA RESPIMAT) 2.5 MCG/ACT AERS, 2 puffs each am, Disp: 1 Inhaler, Rfl: 0 .  valACYclovir (VALTREX) 1000 MG tablet, Take 1 tablet po bid x 3 days prn. (Patient taking differently: Take 1,000 mg by mouth 2 (two) times daily as needed (for 3 days as needed for outbreaks). ), Disp: 30 tablet, Rfl: prn .  diphenhydrAMINE (BENADRYL) 25 MG tablet, Take 1 tablet (25 mg total) by mouth at bedtime as needed for sleep. (Patient not taking: Reported on 09/12/2016), Disp: 30 tablet, Rfl: 0 .  omeprazole (PRILOSEC) 20 MG capsule, Take 1 capsule (20 mg total) by mouth daily as needed. (Patient not taking: Reported on 09/12/2016), Disp: 180 capsule, Rfl: 3 .  Prenat-Fe Poly-Methfol-FA-DHA (VITAFOL ULTRA) 29-0.6-0.4-200 MG CAPS, Take 1 capsule by mouth daily before breakfast. (Patient not taking: Reported on 09/12/2016), Disp: 30 capsule, Rfl: 11 No Known Allergies  Social History  Substance Use Topics  . Smoking status: Former Smoker    Quit date: 07/23/2012  . Smokeless tobacco: Never Used  . Alcohol use No    Family History  Problem Relation Age of Onset  . Hypertension Mother   . Hypertension Sister   . Anesthesia problems Sister     post-op N/V  . Heart disease Brother       Review of Systems  Constitutional: negative for fatigue and weight loss Respiratory: negative for cough and wheezing Cardiovascular: negative for chest pain, fatigue and palpitations Gastrointestinal: negative for abdominal pain and  change in bowel habits Musculoskeletal:negative for myalgias Neurological: negative for gait problems and tremors Behavioral/Psych: negative for abusive relationship, depression Endocrine: negative for temperature intolerance    Genitourinary:negative for abnormal menstrual periods, genital lesions, hot flashes, sexual problems and vaginal discharge Integument/breast: negative for breast lump, breast tenderness, nipple discharge and skin lesion(s)    Objective:       BP 102/74   Pulse 74   Ht _0  (1.676 m)   Wt 119 lb 8 oz (54.2 kg)   BMI 19.29 kg/m  General:   alert  Skin:   no rash or abnormalities  Lungs:   clear to auscultation bilaterally  Heart:   regular rate and rhythm, S1, S2 normal, no murmur, click, rub or gallop  Breasts:   normal without suspicious masses, skin or nipple changes or axillary nodes  Abdomen:  normal findings: no organomegaly, soft, non-tender and no hernia  Pelvis:  External genitalia: normal general appearance Urinary system: urethral meatus normal and bladder without fullness, nontender Vaginal: normal without tenderness, induration or masses Cervix: normal appearance Adnexa: normal bimanual exam Uterus: anteverted and non-tender, normal size   Lab Review Urine pregnancy test Labs reviewed yes Radiologic  studies reviewed yes  50% of 20 min visit spent on counseling and coordination of care.    Assessment:    Healthy female exam.    Plan:    Education reviewed: calcium supplements, low fat, low cholesterol diet, self breast exams and weight bearing exercise. Follow up in: 1 year.   No orders of the defined types were placed in this encounter.     Patient ID: RAYAH FINES, female   DOB: 05-26-1956, 60 y.o.   MRN: [id number]

## 2016-09-18 LAB — CYTOLOGY - PAP
ADEQUACY: ABSENT
Diagnosis: NEGATIVE
HPV (WINDOPATH): NOT DETECTED

## 2016-09-20 ENCOUNTER — Other Ambulatory Visit: Payer: Self-pay | Admitting: Obstetrics

## 2016-09-20 DIAGNOSIS — B9689 Other specified bacterial agents as the cause of diseases classified elsewhere: Secondary | ICD-10-CM

## 2016-09-20 DIAGNOSIS — N760 Acute vaginitis: Principal | ICD-10-CM

## 2016-09-20 LAB — NUSWAB BV AND CANDIDA, NAA
ATOPOBIUM VAGINAE: HIGH {score} — AB
CANDIDA GLABRATA, NAA: NEGATIVE
Candida albicans, NAA: NEGATIVE

## 2016-09-20 MED ORDER — METRONIDAZOLE 500 MG PO TABS: 500.0000 mg | ORAL_TABLET | Freq: Two times a day (BID) | ORAL | 2 refills | Status: DC

## 2016-09-27 ENCOUNTER — Telehealth: Payer: Self-pay | Admitting: Internal Medicine

## 2016-09-27 MED ORDER — AZITHROMYCIN 250 MG PO TABS: ORAL_TABLET | ORAL | 0 refills | Status: DC

## 2016-09-27 NOTE — Telephone Encounter (Signed)
MW  Please Advise-Sick Message   Pt. Called in today c/o Coughing a lot with yellow mucus, Denies fever, chest tightness or chest pain, or any breathing issues. She wanted to know if an antibiotic could be called in.   No Known Allergies   Instructions   Please see patient coordinator before you leave today  to schedule pulmonary rehab  No change in medications  Please schedule a follow up visit in 6  months but call sooner if needed      After Visit Summary (Printed 05/31/2016)

## 2016-09-27 NOTE — Telephone Encounter (Signed)
zpak

## 2016-09-27 NOTE — Telephone Encounter (Signed)
lmtcb for pt.  

## 2016-09-27 NOTE — Telephone Encounter (Signed)
Left pt. A detailed vm that her rx was sent to her pharmacy of choice. Nothing further is needed at this time.    Chantel C Bowne      09/27/16 1:40 PM  Note    [facility] pt aware Wert said zpak

## 2016-09-27 NOTE — Telephone Encounter (Signed)
[facility] pt aware Wert said zpak

## 2016-10-02 ENCOUNTER — Telehealth: Payer: Self-pay | Admitting: Internal Medicine

## 2016-10-02 MED ORDER — PREDNISONE 10 MG PO TABS: ORAL_TABLET | ORAL | 0 refills | Status: DC

## 2016-10-02 NOTE — Telephone Encounter (Signed)
Ok - Prednisone 10 mg take  4 each am x 2 days,   2 each am x 2 days,  1 each am x 2 days and stop and then ov if not 100% back to baseline with all active meds in hand

## 2016-10-02 NOTE — Telephone Encounter (Signed)
Spoke with the pt and notified of recs per MW  She verbalized understanding  Rx was sent  She will call for ov will all meds in hand if not improving

## 2016-10-02 NOTE — Telephone Encounter (Signed)
Pt was given zpak for prod cough with discolored mucus by MW on 09/27/16- mucus is now clear/white, but still SOB.  Denies fever, sinus congestion, chest pain.   Pt has taken nothing but zpak to help with s/s.  Pt is requesting a pred taper.    Pt uses [facility] on [address].    MW please advise on pred taper.  Thanks.   Instructions  Please see patient coordinator before you leave today  to schedule pulmonary rehab   No change in medications   Please schedule a follow up visit in 6  months but call sooner if needed

## 2016-11-05 ENCOUNTER — Other Ambulatory Visit: Payer: Self-pay | Admitting: Internal Medicine

## 2016-11-05 DIAGNOSIS — M47812 Spondylosis without myelopathy or radiculopathy, cervical region: Secondary | ICD-10-CM

## 2016-11-06 NOTE — Telephone Encounter (Signed)
As this patient was seen by me and deemed necessary for their treatment, I will refill this prescription for gabapentin.

## 2016-11-11 ENCOUNTER — Ambulatory Visit (INDEPENDENT_AMBULATORY_CARE_PROVIDER_SITE_OTHER): Payer: Medicare Other | Admitting: Internal Medicine

## 2016-11-11 ENCOUNTER — Encounter (INDEPENDENT_AMBULATORY_CARE_PROVIDER_SITE_OTHER): Payer: Self-pay

## 2016-11-11 DIAGNOSIS — I951 Orthostatic hypotension: Secondary | ICD-10-CM

## 2016-11-11 DIAGNOSIS — R229 Localized swelling, mass and lump, unspecified: Secondary | ICD-10-CM

## 2016-11-11 DIAGNOSIS — R2232 Localized swelling, mass and lump, left upper limb: Secondary | ICD-10-CM | POA: Diagnosis not present

## 2016-11-11 DIAGNOSIS — Z87891 Personal history of nicotine dependence: Secondary | ICD-10-CM

## 2016-11-11 DIAGNOSIS — E860 Dehydration: Secondary | ICD-10-CM

## 2016-11-11 DIAGNOSIS — IMO0002 Reserved for concepts with insufficient information to code with codable children: Secondary | ICD-10-CM

## 2016-11-11 NOTE — Progress Notes (Signed)
CC: lump on my arm   HPI: [name] is a 61 y.o. with past medical history as outlined below who presents to clinic for evaluation of a lump on her arm which has been present since a fall 1 month ago. She was sitting in a stool watching TV when she nodded off to sleep and fell off of the stool. She fell onto her left side and his her arm on a bundle of water bottles. She had bruises over her arms and legs after the fall but these have resolved. She did notice a lump on her left arm which developed after the fall which has gone unchanged since that time. She sometimes has tingling over the area of the bump over the area of the bump. The bump is not painful and does not affect the use of her arm.   Please see problem list for status of the pt's chronic medical problems.  Past Medical History:  Diagnosis Date  . Cataract, immature    bilateral  . COPD (chronic obstructive pulmonary disease) ([place])    no home O2  . Cough 02/09/2015  . DDD (degenerative disc disease), cervical   . Exertional shortness of breath    states if she "takes her time" doing activities does not get SOB  . Family history of adverse reaction to anesthesia    pt's sister has hx. of post-op N/V  . Full dentures   . GERD (gastroesophageal reflux disease) 02/03/2012  . High cholesterol    no current med.  Marland Kitchen History of breast cancer   . Personal history of chemotherapy 1997  . Runny nose 02/09/2015   clear drainage, per pt.    Review of Systems:  Please see each problem below for a pertinent review of systems.  Physical Exam:  Vitals:   11/11/16 1040  BP: 93/67  Pulse: 98  Temp: 97.5 F (36.4 C)  TempSrc: Oral  SpO2: 100%  Weight: 116 lb 14.4 oz (53 kg)   Physical Exam  Constitutional: She appears well-developed and well-nourished. No distress.  Cardiovascular: Normal rate and regular rhythm.   No murmur heard. Pulses intact and equal in both wrist   Neurological: She is alert.  Upper extremity  strength 5/5 with shoulder abduction, elbow flexion and extension, and had grip bilateral  Skin: Skin is warm and dry. She is not diaphoretic.  1.5 cm subcutaneous non tender mass over left lateral arm  Psychiatric: She has a normal mood and affect. Her behavior is normal.   Assessment & Plan:   See Encounters Tab for problem based charting.  Hypotension  BP was 93/67 at presentation. At prior office visits she has a history of hypotension with SBP 100s. She has a hard time answering the question but denies lightheadedness or dizziness with standing or syncope. She says that she recently moved and has not had the chance to set up her kitchen yet so she has been eating about 1 full meal per day and drinking 2 glasses of water per day.  - performed orthostatics- lying- 100/66, sitting 82/62, standing 78/60 I believe orthostatic hypotension is related to dehydration, have encouraged her to drink more liquids and she will return to clinic if this becomes symptomatic.   Left arm lump  This could be the result of a hematoma suffered when she fell. The stable size and asymptomatic nature of the lump are reassuring. She will continue to monitor this lump.   Patient discussed with Dr. Eppie Gibson

## 2016-11-11 NOTE — Patient Instructions (Signed)
It was a pleasure to meet you today Ms. Mary Jenkins,   For the bruise on your arm,  Keep watching this, if it changes in size or sensation let us know   For your low blood pressure,  Stay hydrated, try to drink about 8-10 cups of water per day  Please schedule a follow up appointment in 3 months

## 2016-11-13 ENCOUNTER — Ambulatory Visit: Payer: Medicare Other | Admitting: Acute Care

## 2016-11-13 ENCOUNTER — Emergency Department (HOSPITAL_COMMUNITY)
Admission: EM | Admit: 2016-11-13 | Discharge: 2016-11-13 | Disposition: A | Discharge status: Home / Self Care | Payer: Medicare Other | Attending: Emergency Medicine | Admitting: Emergency Medicine

## 2016-11-13 ENCOUNTER — Emergency Department (HOSPITAL_COMMUNITY): Payer: Medicare Other

## 2016-11-13 ENCOUNTER — Encounter (HOSPITAL_COMMUNITY): Payer: Self-pay | Admitting: *Deleted

## 2016-11-13 DIAGNOSIS — B349 Viral infection, unspecified: Secondary | ICD-10-CM

## 2016-11-13 DIAGNOSIS — Z87891 Personal history of nicotine dependence: Secondary | ICD-10-CM | POA: Insufficient documentation

## 2016-11-13 DIAGNOSIS — J449 Chronic obstructive pulmonary disease, unspecified: Secondary | ICD-10-CM | POA: Diagnosis not present

## 2016-11-13 DIAGNOSIS — R05 Cough: Secondary | ICD-10-CM | POA: Diagnosis not present

## 2016-11-13 DIAGNOSIS — R0602 Shortness of breath: Secondary | ICD-10-CM | POA: Diagnosis not present

## 2016-11-13 LAB — CBC WITH DIFFERENTIAL/PLATELET
BASOS PCT: 0 %
Basophils Absolute: 0 10*3/uL (ref 0.0–0.1)
EOS ABS: 0 10*3/uL (ref 0.0–0.7)
Eosinophils Relative: 0 %
HEMATOCRIT: 41.1 % (ref 36.0–46.0)
Hemoglobin: 13.4 g/dL (ref 12.0–15.0)
Lymphocytes Relative: 13 %
Lymphs Abs: 0.8 10*3/uL (ref 0.7–4.0)
MCH: 26.3 pg (ref 26.0–34.0)
MCHC: 32.6 g/dL (ref 30.0–36.0)
MCV: 80.6 fL (ref 78.0–100.0)
MONOS PCT: 10 %
Monocytes Absolute: 0.6 10*3/uL (ref 0.1–1.0)
Neutro Abs: 4.8 10*3/uL (ref 1.7–7.7)
Neutrophils Relative %: 77 %
Platelets: 266 10*3/uL (ref 150–400)
RBC: 5.1 MIL/uL (ref 3.87–5.11)
RDW: 16.6 % — AB (ref 11.5–15.5)
WBC: 6.2 10*3/uL (ref 4.0–10.5)

## 2016-11-13 LAB — I-STAT TROPONIN, ED: TROPONIN I, POC: 0 ng/mL (ref 0.00–0.08)

## 2016-11-13 LAB — COMPREHENSIVE METABOLIC PANEL
ALBUMIN: 3.8 g/dL (ref 3.5–5.0)
ALK PHOS: 53 U/L (ref 38–126)
ALT: 22 U/L (ref 14–54)
AST: 32 U/L (ref 15–41)
Anion gap: 11 (ref 5–15)
BUN: 5 mg/dL — ABNORMAL LOW (ref 6–20)
CALCIUM: 8.9 mg/dL (ref 8.9–10.3)
CO2: 24 mmol/L (ref 22–32)
CREATININE: 0.92 mg/dL (ref 0.44–1.00)
Chloride: 102 mmol/L (ref 101–111)
GFR calc Af Amer: >60 mL/min (ref >60)
GFR calc non Af Amer: >60 mL/min (ref >60)
GLUCOSE: 96 mg/dL (ref 65–99)
Potassium: 4.1 mmol/L (ref 3.5–5.1)
SODIUM: 137 mmol/L (ref 135–145)
Total Bilirubin: 0.9 mg/dL (ref 0.3–1.2)
Total Protein: 7 g/dL (ref 6.5–8.1)

## 2016-11-13 LAB — I-STAT CG4 LACTIC ACID, ED: Lactic Acid, Venous: 1.12 mmol/L (ref 0.5–1.9)

## 2016-11-13 MED ORDER — BENZONATATE 100 MG PO CAPS: 200.0000 mg | ORAL_CAPSULE | Freq: Two times a day (BID) | ORAL | 0 refills | Status: DC | PRN

## 2016-11-13 MED ORDER — SODIUM CHLORIDE 0.9 % IV BOLUS (SEPSIS)
1000.0000 mL | Freq: Once | INTRAVENOUS | Status: AC
  Administered 2016-11-13: 1000 mL via INTRAVENOUS

## 2016-11-13 MED ORDER — IPRATROPIUM-ALBUTEROL 0.5-2.5 (3) MG/3ML IN SOLN
3.0000 mL | Freq: Once | RESPIRATORY_TRACT | Status: AC
  Administered 2016-11-13: 3 mL via RESPIRATORY_TRACT
  Filled 2016-11-13: qty 3

## 2016-11-13 MED ORDER — ACETAMINOPHEN 500 MG PO TABS
1000.0000 mg | ORAL_TABLET | Freq: Once | ORAL | Status: AC
  Administered 2016-11-13: 1000 mg via ORAL
  Filled 2016-11-13: qty 2

## 2016-11-13 MED ORDER — OXYMETAZOLINE HCL 0.05 % NA SOLN: 1.0000 | Freq: Two times a day (BID) | NASAL | 0 refills | Status: DC

## 2016-11-13 NOTE — Progress Notes (Signed)
Case discussed with Dr. Hetty Ely at the time of the visit.  We reviewed the resident's history and exam and pertinent patient test results.  I agree with the assessment, diagnosis and plan of care documented in the resident's note.

## 2016-11-13 NOTE — Assessment & Plan Note (Signed)
BP was 93/67 at presentation. At prior office visits she has a history of hypotension with SBP 100s. She has a hard time answering the question but denies lightheadedness or dizziness with standing or syncope. She says that she recently moved and has not had the chance to set up her kitchen yet so she has been eating about 1 full meal per day and drinking 2 glasses of water per day.  - performed orthostatics- lying- 100/66, sitting 82/62, standing 78/60 I believe orthostatic hypotension is related to dehydration, have encouraged her to drink more liquids and she will return to clinic if this becomes symptomatic.

## 2016-11-13 NOTE — Assessment & Plan Note (Signed)
lump on her arm which has been present since a fall 1 month ago. She was sitting in a stool watching TV when she nodded off to sleep and fell off of the stool. She fell onto her left side and his her arm on a bundle of water bottles. She had bruises over her arms and legs after the fall but these have resolved. She did notice a lump on her left arm which developed after the fall which has gone unchanged since that time. She sometimes has tingling over the area of the bump over the area of the bump. The bump is not painful and does not affect the use of her arm.   This could be the result of a hematoma suffered when she fell. The stable size and asymptomatic nature of the lump are reassuring. She will continue to monitor this lump.

## 2016-11-13 NOTE — ED Notes (Signed)
Patient stating she is more SOB, oxygen dropped to 88%, pt placed on 2L [place] and oxygen now 96%, NP Elmyra Ricks alerted

## 2016-11-13 NOTE — ED Provider Notes (Signed)
[facility] DEPT Provider Note   CSN: [id number] Arrival date & time: 11/13/16  1355     History   Chief Complaint Chief Complaint  Patient presents with  . Shortness of Breath    HPI Mary Jenkins is a 61 y.o. female.  HPI   Patient is a 61 year old female history of COPD and breast cancer (s/p chemo and mastectomy in 1997) who presents to the ED with complaint of worsening shortness of breath. Patient reports over the past 5 days she has had worsening chills, generalized body aches, productive cough (clear/white sputum), shortness of breath, chest tightness, nasal congestion, sinus pressure, rhinorrhea. Patient denies taking any medications at home for his symptoms. Reports she has been around her friend over the past week who had similar symptoms and was recently diagnosed with acute bronchitis. Denies headache, lightheadedness, chest pain, hemoptysis, abdominal pain, nausea, vomiting, diarrhea, urinary symptoms, leg swelling, rash. Patient denies any recent hospitalizations.  Past Medical History:  Diagnosis Date  . Cataract, immature    bilateral  . COPD (chronic obstructive pulmonary disease) ([place])    no home O2  . Cough 02/09/2015  . DDD (degenerative disc disease), cervical   . Exertional shortness of breath    states if she "takes her time" doing activities does not get SOB  . Family history of adverse reaction to anesthesia    pt's sister has hx. of post-op N/V  . Full dentures   . GERD (gastroesophageal reflux disease) 02/03/2012  . High cholesterol    no current med.  Marland Kitchen History of breast cancer   . Personal history of chemotherapy 1997  . Runny nose 02/09/2015   clear drainage, per pt.    Patient Active Problem List   Diagnosis Date Noted  . Orthostatic hypotension 11/13/2016  . Lump 11/13/2016  . Nausea with vomiting 06/20/2016  . History of hyperkalemia 02/13/2016  . History of trichomonal vaginitis 09/29/2015  . Skin lesion of back 11/15/2014  .  Visit for screening mammogram 11/09/2014  . Atypical chest pain 10/21/2014  . Cataract 10/21/2014  . H/O hyperglycemia 10/21/2014  . Oral ulcer 08/31/2014  . Right leg pain 08/05/2014  . Dyslipidemia, goal to be determined 04/22/2014  . Subcutaneous nodule 01/17/2014  . Healthcare maintenance 07/05/2013  . Lipoma 10/07/2012  . Cervical spine arthritis ([place]) 08/27/2012  . History of breast cancer 07/23/2012  . GERD (gastroesophageal reflux disease) 02/03/2012  . COPD GOLD IV 06/24/2011  . Liver cyst 06/24/2011  . Insomnia 06/24/2011    Past Surgical History:  Procedure Laterality Date  . BREAST CAPSULOTOMY WITH IMPLANT EXCHANGE Right 06/21/2013   Procedure: REVISION RIGHT BREAST RECONSTRUCTION/REMOVAL OF RIGHT IMPLANT/RIGHT BREAST CAPSULOTOMY WITH INSERT TISSUE EXPLANDER RIGHT BREAST/POSSIBLE LEFT BREAST MASTOPEXY;  Surgeon: Cristine Polio, MD;  Location: [place] [facility];  Service: Plastics;  Laterality: Right;  . BREAST RECONSTRUCTION Right 06/21/2013   Procedure: BREAST RECONSTRUCTION;  Surgeon: Cristine Polio, MD;  Location: [facility];  Service: Plastics;  Laterality: Right;  . BREAST RECONSTRUCTION Left 11/29/2013   Procedure: LEFT MASTOPEXY FOR RECONSTRUCTION;  Surgeon: Cristine Polio, MD;  Location: [facility];  Service: Plastics;  Laterality: Left;  . BREAST RECONSTRUCTION Right 03/14/2014   Procedure: RECONSTRUCTION NIPPLE RIGHT BREAST;  Surgeon: Cristine Polio, MD;  Location: [facility];  Service: Plastics;  Laterality: Right;  . BREAST SURGERY  97   implant  . COLONOSCOPY    . MASTECTOMY, PARTIAL Right 1997   -node dissection  .  REMOVAL OF TISSUE EXPANDER AND PLACEMENT OF IMPLANT Right 02/13/2015   Procedure: REMOVAL OF TISSUE  EXPANDER  PORT RIGHT BREAST;  Surgeon: Cristine Polio, MD;  Location: [facility];  Service: Plastics;  Laterality: Right;  . SCAR REVISION Right 02/13/2015   Procedure: SCAR  REVISION RIGHT BREAST;  Surgeon: Cristine Polio, MD;  Location: [facility];  Service: Plastics;  Laterality: Right;  . TISSUE EXPANDER PLACEMENT Right 03/14/2014   Procedure: SALINE REMOVAL RIGHT TISSUE EXPANDER;  Surgeon: Cristine Polio, MD;  Location: [facility];  Service: Plastics;  Laterality: Right;  . TUBAL LIGATION  1980's    OB History    No data available       Home Medications    Prior to Admission medications   Medication Sig Start Date End Date Taking? Authorizing Provider  albuterol (PROVENTIL HFA;VENTOLIN HFA) 108 (90 Base) MCG/ACT inhaler Inhale 2 puffs into the lungs every 6 (six) hours as needed for wheezing. 03/01/16 07/17/17 Yes Rushil Sherrye Payor, MD  albuterol (PROVENTIL) (2.5 MG/3ML) 0.083% nebulizer solution Take 3 mLs (2.5 mg total) by nebulization every 6 (six) hours as needed for wheezing. For wheezing 07/26/15  Yes Rushil Sherrye Payor, MD  budesonide-formoterol [facility]) 160-4.5 MCG/ACT inhaler Inhale 2 puffs into the lungs 2 (two) times daily. 04/05/16  Yes Tanda Rockers, MD  gabapentin (NEURONTIN) 400 MG capsule TAKE ONE CAPSULE BY MOUTH THREE TIMES DAILY 11/06/16  Yes Rushil Sherrye Payor, MD  metroNIDAZOLE (FLAGYL) 500 MG tablet Take 1 tablet (500 mg total) by mouth 2 (two) times daily. 09/20/16  Yes Shelly Bombard, MD  RESTASIS 0.05 % ophthalmic emulsion Place into both eyes 2 (two) times daily. 11/09/14  Yes Historical Provider, MD  Tiotropium Bromide Monohydrate (SPIRIVA RESPIMAT) 2.5 MCG/ACT AERS 2 puffs each am Patient taking differently: Take 1 puff by mouth daily. 2 puffs each am 04/05/16  Yes Tanda Rockers, MD  valACYclovir (VALTREX) 1000 MG tablet Take 1 tablet po bid x 3 days prn. Patient taking differently: Take 1,000 mg by mouth 2 (two) times daily as needed (for 3 days as needed for outbreaks).  09/12/15  Yes Shelly Bombard, MD  azithromycin [facility]) 250 MG tablet Take as directed Patient not taking: Reported on 11/13/2016  09/27/16   Tanda Rockers, MD  benzonatate (TESSALON) 100 MG capsule Take 2 capsules (200 mg total) by mouth 2 (two) times daily as needed for cough. 11/13/16   Nona Dell, PA-C  diphenhydrAMINE (BENADRYL) 25 MG tablet Take 1 tablet (25 mg total) by mouth at bedtime as needed for sleep. Patient not taking: Reported on 11/13/2016 02/05/16   Riccardo Dubin, MD  omeprazole (PRILOSEC) 20 MG capsule Take 1 capsule (20 mg total) by mouth daily as needed. Patient not taking: Reported on 09/12/2016 04/05/16   Riccardo Dubin, MD  oxymetazoline (AFRIN NASAL SPRAY) 0.05 % nasal spray Place 1 spray into both nostrils 2 (two) times daily. Spray once into each nostril twice daily for up to the next 3 days. Do not use for more than 3 days to prevent rebound rhinorrhea. 11/13/16   Nona Dell, PA-C  predniSONE (DELTASONE) 10 MG tablet 4 x 2 days, 2 x 2 days, 1 x 2 days, then stop Patient not taking: Reported on 11/13/2016 10/02/16   Tanda Rockers, MD  Prenat-Fe Poly-Methfol-FA-DHA (VITAFOL ULTRA) 29-0.6-0.4-200 MG CAPS Take 1 capsule by mouth daily before breakfast. Patient not taking: Reported on 09/12/2016 09/12/15  Shelly Bombard, MD    Family History Family History  Problem Relation Age of Onset  . Hypertension Mother   . Hypertension Sister   . Anesthesia problems Sister     post-op N/V  . Heart disease Brother     Social History Social History  Substance Use Topics  . Smoking status: Former Smoker    Quit date: 07/23/2012  . Smokeless tobacco: Never Used  . Alcohol use No     Allergies   Patient has no known allergies.   Review of Systems Review of Systems  Constitutional: Positive for chills and fever (subjective).  HENT: Positive for congestion and sinus pain.   Respiratory: Positive for cough, chest tightness and shortness of breath.   Musculoskeletal: Positive for myalgias (generalized).  All other systems reviewed and are negative.    Physical  Exam Updated Vital Signs BP 109/77   Pulse 93   Temp 99.9 F (37.7 C) (Oral)   Resp 15   SpO2 100%   Physical Exam  Constitutional: She is oriented to person, place, and time. She appears well-developed and well-nourished. No distress.  Thin appearing female  HENT:  Head: Normocephalic and atraumatic.  Nose: Rhinorrhea present. Right sinus exhibits no maxillary sinus tenderness and no frontal sinus tenderness. Left sinus exhibits no maxillary sinus tenderness and no frontal sinus tenderness.  Mouth/Throat: Uvula is midline, oropharynx is clear and moist and mucous membranes are normal. No oropharyngeal exudate, posterior oropharyngeal edema, posterior oropharyngeal erythema or tonsillar abscesses. No tonsillar exudate.  Eyes: Conjunctivae and EOM are normal. Pupils are equal, round, and reactive to light. Right eye exhibits no discharge. Left eye exhibits no discharge. No scleral icterus.  Neck: Normal range of motion. Neck supple.  Cardiovascular: Regular rhythm, normal heart sounds and intact distal pulses.   Mildly tachycardic, HR 102  Pulmonary/Chest: Effort normal and breath sounds normal. No respiratory distress. She has no wheezes. She has no rales. She exhibits no tenderness.  Abdominal: Soft. Bowel sounds are normal. She exhibits no distension and no mass. There is no tenderness. There is no rebound and no guarding. No hernia.  Musculoskeletal: Normal range of motion. She exhibits no edema.  Lymphadenopathy:    She has no cervical adenopathy.  Neurological: She is alert and oriented to person, place, and time.  Skin: Skin is warm and dry. She is not diaphoretic.  Nursing note and vitals reviewed.    ED Treatments / Results  Labs (all labs ordered are listed, but only abnormal results are displayed) Labs Reviewed  COMPREHENSIVE METABOLIC PANEL - Abnormal; Notable for the following:       Result Value   BUN 5 (*)    All other components within normal limits  CBC WITH  DIFFERENTIAL/PLATELET - Abnormal; Notable for the following:    RDW 16.6 (*)    All other components within normal limits  URINALYSIS, ROUTINE W REFLEX MICROSCOPIC  I-STAT CG4 LACTIC ACID, ED  I-STAT TROPOININ, ED    EKG  EKG Interpretation  Date/Time:  Wednesday November 13 2016 14:06:36 EST Ventricular Rate:  110 PR Interval:  140 QRS Duration: 62 QT Interval:  330 QTC Calculation: 446 R Axis:   98 Text Interpretation:  Sinus tachycardia Right atrial enlargement Rightward axis Cannot rule out Anterior infarct , age undetermined Abnormal ECG No significant change since last tracing Confirmed by [facility] MD, JASON [phone]) on 11/13/2016 4:36:47 PM       Radiology Dg Chest 2 View  Result Date: 11/13/2016  CLINICAL DATA:  Productive cough. EXAM: CHEST  2 VIEW COMPARISON:  01/16/2016 FINDINGS: Lungs are hyperexpanded. Hyperexpansion is consistent with emphysema. The cardiopericardial silhouette is within normal limits for size. Postsurgical change noted right breast. Nodular density/densities projecting over the lungs are compatible with pads for telemetry leads. The visualized bony structures of the thorax are intact. IMPRESSION: Stable.  Emphysema without acute cardiopulmonary findings. Electronically Signed   By: Misty Stanley M.D.   On: 11/13/2016 14:47    Procedures Procedures (including critical care time)  Medications Ordered in ED Medications  acetaminophen (TYLENOL) tablet 1,000 mg (1,000 mg Oral Given 11/13/16 1551)  sodium chloride 0.9 % bolus 1,000 mL (0 mLs Intravenous Stopped 11/13/16 1854)  ipratropium-albuterol (DUONEB) 0.5-2.5 (3) MG/3ML nebulizer solution 3 mL (3 mLs Nebulization Given 11/13/16 1852)     Initial Impression / Assessment and Plan / ED Course  I have reviewed the triage vital signs and the nursing notes.  Pertinent labs & imaging results that were available during my care of the patient were reviewed by me and considered in my medical decision making  (see chart for details).    Patient reports having gradually worsening shortness of breath with associated subjective fever, chills, sinus pressure, nasal congestion, cough, chest tightness and generalized body aches for the past 5 days. Reports she has been around a friend with similar symptoms and was recently diagnosed with bronchitis. Denies any recent hospitalizations. Hx of COPD. Vitals showed temp 101, HR 104, O2 sat 96% on RA, normotensive.  Exam revealed rhinorrhea, lungs clear to auscultation bilaterally. Remaining exam unremarkable. Patient given Tylenol for fever and IV fluids. Suspect patient's symptoms are likely due to to viral illness/influenza.  EKG showed sinus tachycardia, heart rate 110, normal sinus rhythm, no significant change from prior. Troponin negative. Lactate 1.12. Remaining labs unremarkable. Chest x-ray negative. On reevaluation pt with stable vitals, HR 96, O2 sat 96% on RA. Pt requesting neb tx for SOB. On reevaluation s/p neb tx pt reports significant improvement of SOB and reports she is ready to go home. Suspect pt's sxs are likely due to viral illness/influenza. Due to sxs not within 48hour window of onset of sxs, Tamiflu is not indicated. Plan to discharge patient home with symptomatically treatment including decongestion, antitussive and advised to continue taking antipyretics at home. Discussed continue oral rehydration. Advised patient to follow up with PCP for reevaluation. Discussed return precautions.   Final Clinical Impressions(s) / ED Diagnoses   Final diagnoses:  SOB (shortness of breath)  Viral illness    New Prescriptions Discharge Medication List as of 11/13/2016  7:18 PM    START taking these medications   Details  benzonatate (TESSALON) 100 MG capsule Take 2 capsules (200 mg total) by mouth 2 (two) times daily as needed for cough., Starting Wed 11/13/2016, Print    oxymetazoline (AFRIN NASAL SPRAY) 0.05 % nasal spray Place 1 spray into both  nostrils 2 (two) times daily. Spray once into each nostril twice daily for up to the next 3 days. Do not use for more than 3 days to prevent rebound rhinorrhea., Starting Wed 11/13/2016, Print         Chesley Noon [place], [place] 11/13/16 2019    Merrily Pew, MD 11/14/16 [phone]

## 2016-11-13 NOTE — Discharge Instructions (Signed)
Take your medications as prescribed. I also recommend taking Tylenol and ibuprofen as prescribed over-the-counter, alternating between doses every 3-4 hours. Continue drinking fluids at home to remain hydrated. I recommend eating a bland diet for the next few days and taper symptoms have improved. Follow-up with your primary care provider in the next 3-4 days if symptoms have not improved. Return to the emergency department if symptoms worsen or new onset of headache, neck stiffness, difficulty breathing, coughing up blood, chest pain, abdominal pain, vomiting, unable to keep fluids down.

## 2016-11-13 NOTE — ED Triage Notes (Signed)
t reports cough, congestion, fevers at home for 3 days. Pt states that she has used home medications for copd with no relief. Pt tripod position in triage.

## 2016-11-15 ENCOUNTER — Encounter: Payer: Self-pay | Admitting: Internal Medicine

## 2016-11-15 ENCOUNTER — Ambulatory Visit (INDEPENDENT_AMBULATORY_CARE_PROVIDER_SITE_OTHER): Payer: Medicare Other | Admitting: Internal Medicine

## 2016-11-15 VITALS — BP 114/70 | HR 110 | Temp 101.7°F

## 2016-11-15 DIAGNOSIS — J441 Chronic obstructive pulmonary disease with (acute) exacerbation: Secondary | ICD-10-CM

## 2016-11-15 MED ORDER — PREDNISONE 10 MG PO TABS: ORAL_TABLET | ORAL | 0 refills | Status: DC

## 2016-11-15 MED ORDER — ALBUTEROL SULFATE (2.5 MG/3ML) 0.083% IN NEBU: 2.5000 mg | INHALATION_SOLUTION | Freq: Four times a day (QID) | RESPIRATORY_TRACT | 12 refills | Status: DC | PRN

## 2016-11-15 MED ORDER — AZITHROMYCIN 250 MG PO TABS: ORAL_TABLET | ORAL | 0 refills | Status: DC

## 2016-11-15 NOTE — Patient Instructions (Addendum)
zpak   Prednisone 10 mg take  4 each am x 2 days,   2 each am x 2 days,  1 each am x 2 days and stop   Increase prilosec to 20 mg Take 30- 60 min before your first and last meals of the day whenever coughing  Use nebulized albuterol up to every 4 hours for breathing problems/ coughing   Go to ER if can't get comfortable at rest after your nebulizer or can't keep liquids down (soups/gatorade)

## 2016-11-15 NOTE — Assessment & Plan Note (Addendum)
PFT performed on 06/27/2011. Poor quality.    - FEV1 0.39 ( 16%) ratio 23 and 16% better p B2    - 03/13/2012  Inhaler technique 90% with dpi but only 50% with mdi > 50% 04/22/12   - Alpha one genotype 02/13/12 > MM   - 03/22/2013  PFT's 0.56 ( 24%) ratio 33 but 12% better improvement and DLCO 21 corrects to 28%    started Incruse 11/11/14 p spiriva off formulary> d/c 02/28/2015 due to cough  - changed incruse to spiriva respimat 02/28/2015 due to cough  -Spirometry  05/31/2016  FEV1 0.52 (23%)  Ratio 32   - 05/31/2016  After extensive coaching HFA effectiveness =    90%  - referred to rehab 05/31/2016 >>>   Acute onset viral uri but need to cover bronchitis in pt with severe copd with zpak and rx with pred x 6 days/ ppi bid while coughing  Of the three most common causes of chronic cough, only one (GERD)  can actually cause the other two (asthma and post nasal drip syndrome)  and perpetuate the cylce of cough inducing airway trauma, inflammation, heightened sensitivity to reflux which is prompted by the cough itself via a cyclical mechanism.    This may partially respond to steroids and look like asthma and post nasal drainage but never erradicated completely unless the cough and the secondary reflux are eliminated, preferably both at the same time.  While not intuitively obvious, many patients with chronic low grade reflux do not cough until there is a secondary insult that disturbs the protective epithelial barrier and exposes sensitive nerve endings.  This can be viral as is likely the case here  or direct physical injury such as with an endotracheal tube.   The point is that once this occurs, it is difficult to eliminate using anything but a maximally effective acid suppression regimen at least in the short run, accompanied by an appropriate diet to address non acid GERD.   I had an extended discussion with the patient reviewing all relevant studies including full er note/ labs/xrays) completed to date and   lasting 15 to 20 minutes of a 25 minute acute office visit    Each maintenance medication was reviewed in detail including most importantly the difference between maintenance and prns and under what circumstances the prns are to be triggered using an action plan format that is not reflected in the computer generated alphabetically organized AVS.    Please see AVS for specific instructions unique to this visit that I personally wrote and verbalized to the the pt in detail and then reviewed with pt  by my nurse highlighting any  changes in therapy recommended at today's visit to their plan of care.
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
## 2016-11-21 ENCOUNTER — Ambulatory Visit (INDEPENDENT_AMBULATORY_CARE_PROVIDER_SITE_OTHER): Payer: Medicare Other | Admitting: Internal Medicine

## 2016-11-21 ENCOUNTER — Encounter: Payer: Self-pay | Admitting: Internal Medicine

## 2016-11-21 VITALS — BP 120/72 | HR 85 | Ht 66.0 in | Wt 117.8 lb

## 2016-11-21 DIAGNOSIS — J449 Chronic obstructive pulmonary disease, unspecified: Secondary | ICD-10-CM

## 2016-11-21 NOTE — Patient Instructions (Signed)
Plan A = Automatic = Symbicort/ spiriva daily   Plan B = Backup Only use your albuterol as a rescue medication to be used if you can't catch your breath by resting or doing a relaxed purse lip breathing pattern.  - The less you use it, the better it will work when you need it. - Ok to use the inhaler up to 2 puffs  every 4 hours if you must but call for appointment if use goes up over your usual need - Don't leave home without it !!  (think of it like the spare tire for your car)   Plan C = Crisis - only use your albuterol nebulizer if you first try Plan B and it fails to help > ok to use the nebulizer up to every 4 hours but if start needing it regularly call for immediate appointment    mucinex dm up to 1200 mg every 12 hours as needed for cough   Please schedule a follow up visit in 3 months but call sooner if needed
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
## 2016-11-24 NOTE — Assessment & Plan Note (Addendum)
PFT performed on 06/27/2011. Poor quality.    - FEV1 0.39 ( 16%) ratio 23 and 16% better p B2    - 03/13/2012  Inhaler technique 90% with dpi but only 50% with mdi > 50% 04/22/12   - Alpha one genotype 02/13/12 > MM   - 03/22/2013  PFT's 0.56 ( 24%) ratio 33 but 12% better improvement and DLCO 21 corrects to 28%    started Incruse 11/11/14 p spiriva off formulary> d/c 02/28/2015 due to cough  - changed incruse to spiriva respimat 02/28/2015 due to cough  -Spirometry  05/31/2016  FEV1 0.52 (23%)  Ratio 32   - 05/31/2016  After extensive coaching HFA effectiveness =    90%  - referred to rehab 05/31/2016 >>>    Group D in terms of symptom/risk and laba/lama/ICS  therefore appropriate rx at this point.   despite severity of dz and recent aecopd she has stabilized on current rx / albeit quite complex   I had an extended discussion with the patient reviewing all relevant studies completed to date and  lasting 10 minutes of a 15 minute visit    Each maintenance medication was reviewed in detail including most importantly the difference between maintenance and prns and under what circumstances the prns are to be triggered using an action plan format that is not reflected in the computer generated alphabetically organized AVS.    Please see AVS for specific instructions unique to this visit that I personally wrote and verbalized to the the pt in detail and then reviewed with pt  by my nurse highlighting any  changes in therapy recommended at today's visit to their plan of care.

## 2016-11-25 ENCOUNTER — Telehealth: Payer: Self-pay

## 2016-11-25 ENCOUNTER — Telehealth: Payer: Self-pay | Admitting: Internal Medicine

## 2016-11-25 MED ORDER — TRAMADOL HCL 50 MG PO TABS: 50.0000 mg | ORAL_TABLET | ORAL | 0 refills | Status: DC | PRN

## 2016-11-25 MED ORDER — TRAMADOL HCL 50 MG PO TABS: ORAL_TABLET | ORAL | 0 refills | Status: DC

## 2016-11-25 NOTE — Telephone Encounter (Signed)
[phone] calling back

## 2016-11-25 NOTE — Telephone Encounter (Signed)
Take mucinex dm 1200 mg  every 12 hours and supplement if needed with   up to 1-2 every 4 hours to suppress the urge to cough. Swallowing water or using ice chips/non mint and menthol containing candies (such as lifesavers or sugarless jolly ranchers) are also effective.  You should rest your voice and avoid activities that you know make you cough.  Once you have eliminated the cough for 3 straight days try reducing the tramadol first,  then the mucinex dm  as tolerated.    rx #40

## 2016-11-25 NOTE — Telephone Encounter (Signed)
lmtcb x1 for pt. 

## 2016-11-25 NOTE — Telephone Encounter (Signed)
Spoke with pt. States that she is not improving since her last OV with MW. Reports cough and wheezing. Cough is now producing yellow mucus. Denies chest tightness, SOB or fever. Has been taking Mucinex DM as instructed with no relief. Would like to have something called in.  MW - please advise. Thanks.

## 2016-11-25 NOTE — Telephone Encounter (Signed)
Spoke with Mary Jenkins with [employer], and phoned in Rx. Pt aware and voiced her understanding. Nothing further needed.

## 2016-11-25 NOTE — Telephone Encounter (Signed)
Fine with me  rx = tramadol 50 mg # 40  1-2 q 4 h prn cough not responding to mucinex dm 1200

## 2016-11-25 NOTE — Telephone Encounter (Signed)
Pt aware of MW recommendations & voiced her understandings. Pt states she is currently out of town, and will not be able to pick the tramadol up until Wednesday. She would like Rx sent to [place] on [address].   MW please advise on dosage of tramadol. Thanks.

## 2016-11-27 NOTE — Telephone Encounter (Signed)
error 

## 2016-12-09 ENCOUNTER — Telehealth: Payer: Self-pay | Admitting: *Deleted

## 2016-12-09 ENCOUNTER — Telehealth: Payer: Self-pay | Admitting: Internal Medicine

## 2016-12-09 DIAGNOSIS — F4320 Adjustment disorder, unspecified: Secondary | ICD-10-CM

## 2016-12-09 MED ORDER — PREDNISONE 10 MG PO TABS: ORAL_TABLET | ORAL | 0 refills | Status: DC

## 2016-12-09 NOTE — Telephone Encounter (Signed)
Dr. Melvyn Novas   Please Advise-  Pt called in and she lost a family member this past week and it she has been having a hard time coping that she experiencing trouble catching breath, chest tightness, she is using her rescue inhaler but not getting relief, denies coughing,fever. Wanted to know if she could have prednisone called in.

## 2016-12-09 NOTE — Telephone Encounter (Addendum)
Pt calls and states that her sister has just passed away and she needs something for her nerves, the sister died suddenly and pt is beside herself in grief, could we call a short supply of something in?

## 2016-12-09 NOTE — Telephone Encounter (Signed)
Prednisone 10 mg take  4 each am x 2 days,   2 each am x 2 days,  1 each am x 2 days and stop  

## 2016-12-09 NOTE — Telephone Encounter (Signed)
Spoke with pt and informed her of Dr. Gustavus Bryant message. Pt verified which pharmacy she needed this sent to, and her rx was sent in. Nothing further is needed at this time.

## 2016-12-10 MED ORDER — DIAZEPAM 5 MG PO TABS: 5.0000 mg | ORAL_TABLET | Freq: Three times a day (TID) | ORAL | 0 refills | Status: DC | PRN

## 2016-12-10 NOTE — Telephone Encounter (Signed)
I have called this to [place] at [facility]

## 2016-12-10 NOTE — Telephone Encounter (Signed)
Unfortunately, we cannot call in mood-altering medication over the phone. She would need to be seen in [facility].

## 2016-12-10 NOTE — Telephone Encounter (Signed)
Pt states she cannot come in as she is involved in the funeral of her sister

## 2016-12-10 NOTE — Addendum Note (Signed)
Addended by: Riccardo Dubin on: 12/10/2016 02:49 PM   Modules accepted: Orders

## 2016-12-10 NOTE — Telephone Encounter (Signed)
I spoke with Rushil about the case. Acute grief is one of those times when it is reasonable to call in a small script of benzo like diazepam. She has no red flags, no other centrally acting medications. He will call the the patient and likely approve a short script to help her through the coming days.
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
## 2017-01-26 ENCOUNTER — Other Ambulatory Visit: Payer: Self-pay | Admitting: Internal Medicine

## 2017-02-21 ENCOUNTER — Ambulatory Visit (INDEPENDENT_AMBULATORY_CARE_PROVIDER_SITE_OTHER): Payer: Medicare Other | Admitting: Internal Medicine

## 2017-02-21 ENCOUNTER — Encounter: Payer: Self-pay | Admitting: Internal Medicine

## 2017-02-21 VITALS — BP 100/64 | HR 83 | Ht 66.0 in | Wt 107.8 lb

## 2017-02-21 DIAGNOSIS — J449 Chronic obstructive pulmonary disease, unspecified: Secondary | ICD-10-CM

## 2017-02-21 NOTE — Assessment & Plan Note (Signed)
PFT performed on 06/27/2011. Poor quality.    - FEV1 0.39 ( 16%) ratio 23 and 16% better p B2    - 03/13/2012  Inhaler technique 90% with dpi but only 50% with mdi > 50% 04/22/12   - Alpha one genotype 02/13/12 > MM   - 03/22/2013  PFT's 0.56 ( 24%) ratio 33 but 12% better improvement and DLCO 21 corrects to 28%    started Incruse 11/11/14 p spiriva off formulary> d/c 02/28/2015 due to cough  - changed incruse to spiriva respimat 02/28/2015 due to cough  -Spirometry  05/31/2016  FEV1 0.52 (23%)  Ratio 32   - 05/31/2016  After extensive coaching HFA effectiveness =    90%  - referred to rehab 05/31/2016 >>> did not go   Despite very severe airflow obst she is relatively well compensated on present rx with acceptable use of saba though I suspect she using it prn sob and not prn can't catch her breath and needs to learn pacing better but declined rehab so nothing else to offer for now  I had an extended discussion with the patient reviewing all relevant studies completed to date and  lasting 15 to 20 minutes of a 25 minute visit    Each maintenance medication was reviewed in detail including most importantly the difference between maintenance and prns and under what circumstances the prns are to be triggered using an action plan format that is not reflected in the computer generated alphabetically organized AVS.    Please see AVS for specific instructions unique to this visit that I personally wrote and verbalized to the the pt in detail and then reviewed with pt  by my nurse highlighting any  changes in therapy recommended at today's visit to their plan of care.

## 2017-02-21 NOTE — Progress Notes (Signed)
Subjective:    Patient ID: Mary Jenkins, female    DOB: 1956/01/25  MRN: [id number]    Brief patient profile:   67 yobf quit smoking  07/24/11 not taking breathing medications at that point and subsequently placed on multiple meds and still sob just getting dressed so referred from the medicine clinic at [facility] 02/13/2012 for pulmonary eval with documented GOLD IV COPD 02/2013     Brief patient profile:  02/13/2012 1st pulmonary cc progressive worse x 6 months doe x 50-100 ft can't do a grocery store where could do before quit. No real variability, cough is better with no excess or purulent sputum.  Not using saba daytime because finds if she's holding still doesn't need it as oftern. Some better p last  Prednisone rx. Not much variability. rec Stop atrovent and advair Start symbiocort Take 2 puffs first thing in am and then another 2 puffs about 12 hours later.  Take after only am dose Spiriva Protonix 40 mg Take 30-60 min before first meal of the day  GERD diet Only use your albuterol (Plan B= ventolin puffer,  Plan C is nebulizer) as a rescue medication to be used if you can't catch your breath by resting or doing a relaxed purse lip breathing pattern. The less you use it, the better it will work when you need it. Ok to use rescue up to every 4 hours if doing poorly.      11/21/2016  f/u ov/Bradshaw Minihan re: GOLD IV copd / symb/ spiriva / no 02 Chief Complaint  Patient presents with  . Follow-up    follows for COPD, pt resports she is still SOB with exertion, coughing with clear/yellow mucus   still congested cough but overall improved  Doe still = MMRC3 / rarely using saba and never needs noct  rec Plan A = Automatic = Symbicort/ spiriva daily  Plan B = Backup Only use your albuterol as a rescue medication  Plan C = Crisis - only use your albuterol nebulizer if you first try Plan B  mucinex dm up to 1200 mg every 12 hours as needed for cough     02/21/2017  f/u ov/Santiago Stenzel re: GOLD IV  Copd/  sym/ spiriva maint rx  Chief Complaint  Patient presents with  . Follow-up    Breathing is overall doing well. She is using her albuterol inhaler 2-3 x per day on average and neb with albuterol maybe once per wk.    MMRC2 = can't walk a nl pace on a flat grade s sob but does fine slow and flat pushes the cart most times at [employer] and even sometimes at [employer]   No obvious day to day or daytime variability or assoc excess/ purulent sputum or mucus plugs or hemoptysis or cp or chest tightness, subjective wheeze or overt sinus or hb symptoms. No unusual exp hx or h/o childhood pna/ asthma or knowledge of premature birth.  Sleeping ok without nocturnal  or early am exacerbation  of respiratory  c/o's or need for noct saba. Also denies any obvious fluctuation of symptoms with weather or environmental changes or other aggravating or alleviating factors except as outlined above   Current Medications, Allergies, Complete Past Medical History, Past Surgical History, Family History, and Social History were reviewed in [employer] record.  ROS  The following are not active complaints unless bolded sore throat, dysphagia, dental problems, itching, sneezing,  nasal congestion or excess/ purulent secretions, ear ache,  fever, chills, sweats, unintended wt loss, classically pleuritic or exertional cp,  orthopnea pnd or leg swelling, presyncope, palpitations, abdominal pain, anorexia, nausea, vomiting, diarrhea  or change in bowel or bladder habits, change in stools or urine, dysuria,hematuria,  rash, arthralgias, visual complaints, headache, numbness, weakness or ataxia or problems with walking or coordination,  change in mood/affect or memory.                          Objective:   Physical Exam  Amb bf nad  / vital signs reviewed   - Note on arrival 02 sats  94% on RA      Wt 120 02/13/2012  > 119 03/13/2012 > 04/22/2012 117 > 07/21/2012 122> 12/02/2012  131> 03/22/2013  131 >  04/19/2013  129  > 07/19/2013 131 > 06/06/2014 129 >  11/11/2014  132> 02/28/2015   129 > 01/16/2016  115 >  02/29/2016  110 > 05/31/2016  115  > 11/21/2016   117 > 02/21/2017   107     HEENT: nl   turbinates bilaterally, and oropharynx. Nl external ear canals without cough reflex - edentulous    NECK :  without JVD/Nodes/TM/ nl carotid upstrokes bilaterally   LUNGS: no acc muscle use,  Mod barrel chest with hyperresonance and  distant bs bilaterally, no wheeze     CV:  RRR  no s3 or murmur or increase in P2, and no edema   ABD:  soft and nontender with nl inspiratory excursion  No bruits or organomegaly appreciated, bowel sounds nl  MS:  Nl gait/ ext warm without deformities, calf tenderness, cyanosis or clubbing No obvious joint restrictions   SKIN: warm and dry without lesions    NEURO:  alert, approp, nl sensorium with  no motor or cerebellar deficits apparent.           I personally reviewed images and agree with radiology impression as follows:  CXR:   11/13/16 Stable.  Emphysema without acute cardiopulmonary findings.             Assessment & Plan:

## 2017-02-21 NOTE — Patient Instructions (Addendum)
No change in medications - stay as active as you can but pace yourself  Plan A = Automatic = Symbicort/ spiriva daily   Plan B = Backup Only use your albuterol as a rescue medication to be used if you can't catch your breath by resting or doing a relaxed purse lip breathing pattern.  - The less you use it, the better it will work when you need it. - Ok to use the inhaler up to 2 puffs  every 4 hours if you must but call for appointment if use goes up over your usual need - Don't leave home without it !!  (think of it like the spare tire for your car)   Plan C = Crisis - only use your albuterol nebulizer if you first try Plan B and it fails to help > ok to use the nebulizer up to every 4 hours but if start needing it regularly call for immediate appointment    mucinex dm up to 1200 mg every 12 hours as needed for cough    Please schedule a follow up visit in 6  months but call sooner if needed

## 2017-02-24 ENCOUNTER — Encounter: Payer: Self-pay | Admitting: *Deleted

## 2017-03-10 ENCOUNTER — Encounter: Payer: Self-pay | Admitting: Internal Medicine

## 2017-03-10 ENCOUNTER — Ambulatory Visit (INDEPENDENT_AMBULATORY_CARE_PROVIDER_SITE_OTHER): Payer: Medicare Other | Admitting: Internal Medicine

## 2017-03-10 VITALS — BP 101/67 | HR 79 | Temp 98.2°F | Ht 66.0 in | Wt 107.4 lb

## 2017-03-10 DIAGNOSIS — Z1159 Encounter for screening for other viral diseases: Secondary | ICD-10-CM

## 2017-03-10 DIAGNOSIS — M4692 Unspecified inflammatory spondylopathy, cervical region: Secondary | ICD-10-CM | POA: Diagnosis not present

## 2017-03-10 DIAGNOSIS — M47812 Spondylosis without myelopathy or radiculopathy, cervical region: Secondary | ICD-10-CM

## 2017-03-10 DIAGNOSIS — Z87891 Personal history of nicotine dependence: Secondary | ICD-10-CM

## 2017-03-10 DIAGNOSIS — E785 Hyperlipidemia, unspecified: Secondary | ICD-10-CM

## 2017-03-10 MED ORDER — GABAPENTIN 400 MG PO CAPS: 800.0000 mg | ORAL_CAPSULE | Freq: Two times a day (BID) | ORAL | 3 refills | Status: DC

## 2017-03-10 NOTE — Patient Instructions (Signed)
For the neck pain, we will try physical therapy and pain medicine to see if you might be amenable to injections.  For your bloodwork, if anything comes back abnormal, we will call you. Otherwise, I will mail you the results.  It was a pleasure taking care of you, and I wish you well.

## 2017-03-10 NOTE — Assessment & Plan Note (Signed)
Assessment Her neck pain is bothering her again, particularly when she is sitting with neck flexion. She has known cervical disc disease confirmed with imaging but did feel relief with physical therapy in the past. She takes gabapentin 400 mg twice daily without relief. She denies any pain with shoulder movement, difficulty sleeping, weight loss, low energy.   Given that she improved with conservative therapy in the past, I recommend we try these modalities again. She is not interested in surgical intervention at this point and has been assessed by them.  Plan -Increase gabapentin to 800 mg twice daily. As she recently refilled her medication, I recommended she double her current dose until she runs out after which we can refill at the higher dose. -Refer to physical therapy -Refer to PM&R for consideration of epidural injection

## 2017-03-10 NOTE — Assessment & Plan Note (Addendum)
Assessment She is agreeable to HCV screening today. She denies prior history of illicit drug use or receiving blood products.  Plan -Check HCV  ADDENDUM 03/11/2017  8:39 AM:  HCV negative. I will mail this result to her.

## 2017-03-10 NOTE — Progress Notes (Signed)
   CC: neck pain  HPI:  Mary Jenkins is a 61 y.o. who presents today for neck pain. Please see assessment & plan for status of chronic medical problems.   Past Medical History:  Diagnosis Date  . Cataract, immature    bilateral  . COPD (chronic obstructive pulmonary disease) ([place])    no home O2  . Cough 02/09/2015  . DDD (degenerative disc disease), cervical   . Exertional shortness of breath    states if she "takes her time" doing activities does not get SOB  . Family history of adverse reaction to anesthesia    pt's sister has hx. of post-op N/V  . Full dentures   . GERD (gastroesophageal reflux disease) 02/03/2012  . High cholesterol    no current med.  Marland Kitchen History of breast cancer   . Personal history of chemotherapy 1997  . Runny nose 02/09/2015   clear drainage, per pt.    Review of Systems:  Please see each problem below for a pertinent review of systems.  Physical Exam:  Vitals:   03/10/17 0914  BP: 101/67  Pulse: 79  Temp: 98.2 F (36.8 C)  TempSrc: Oral  SpO2: 95%  Weight: 107 lb 6.4 oz (48.7 kg)  Height: _0  (1.676 m)   Physical Exam  Constitutional: She is oriented to person, place, and time. No distress.  HENT:  Head: Normocephalic and atraumatic.  Eyes: Conjunctivae are normal. No scleral icterus.  Pulmonary/Chest: Effort normal. No respiratory distress.  Musculoskeletal:  Tenderness to palpation overlying the left trapezius. Pain elicited with lateral neck flexion.  Neurological: She is alert and oriented to person, place, and time.  Skin: Skin is warm and dry. She is not diaphoretic.   Assessment & Plan:   See Encounters Tab for problem based charting.  Patient discussed with Dr. Daryll Drown

## 2017-03-11 ENCOUNTER — Encounter: Payer: Self-pay | Admitting: Internal Medicine

## 2017-03-11 LAB — HEPATITIS C ANTIBODY: Hep C Virus Ab: 0.1 s/co ratio (ref 0.0–0.9)

## 2017-03-13 NOTE — Progress Notes (Signed)
Internal Medicine Clinic Attending  Case discussed with Dr. Charlott Rakes at the time of the visit.  We reviewed the resident's history and exam and pertinent patient test results.  I agree with the assessment, diagnosis, and plan of care documented in the resident's note.

## 2017-03-21 ENCOUNTER — Encounter: Payer: Medicare Other | Admitting: Internal Medicine

## 2017-03-24 ENCOUNTER — Other Ambulatory Visit: Payer: Self-pay | Admitting: Internal Medicine

## 2017-03-24 ENCOUNTER — Encounter: Payer: Self-pay | Admitting: *Deleted

## 2017-03-24 DIAGNOSIS — Z1231 Encounter for screening mammogram for malignant neoplasm of breast: Secondary | ICD-10-CM

## 2017-04-15 ENCOUNTER — Encounter: Payer: Self-pay | Admitting: Physical Therapy

## 2017-04-15 ENCOUNTER — Ambulatory Visit: Payer: Medicare Other | Attending: Internal Medicine | Admitting: Physical Therapy

## 2017-04-15 DIAGNOSIS — M542 Cervicalgia: Secondary | ICD-10-CM

## 2017-04-15 DIAGNOSIS — M4692 Unspecified inflammatory spondylopathy, cervical region: Secondary | ICD-10-CM | POA: Insufficient documentation

## 2017-04-15 DIAGNOSIS — R208 Other disturbances of skin sensation: Secondary | ICD-10-CM

## 2017-04-15 DIAGNOSIS — M6281 Muscle weakness (generalized): Secondary | ICD-10-CM

## 2017-04-15 NOTE — Therapy (Signed)
[place], [place], [address] Phone: [phone]   Fax:  [phone]  Physical Therapy Evaluation  Patient Details  Name: Mary Jenkins MRN: [id number] Date of Birth: 1956/01/10 Referring Provider: Isidore Moos  Encounter Date: 04/15/2017      PT End of Session - 04/15/17 1104    Visit Number 1   Number of Visits 16   PT Start Time 4628   PT Stop Time 1111   PT Time Calculation (min) 56 min   Activity Tolerance Patient tolerated treatment well   Behavior During Therapy [facility] for tasks assessed/performed      Past Medical History:  Diagnosis Date  . Cataract, immature    bilateral  . COPD (chronic obstructive pulmonary disease) ([place])    no home O2  . Cough 02/09/2015  . DDD (degenerative disc disease), cervical   . Exertional shortness of breath    states if she "takes her time" doing activities does not get SOB  . Family history of adverse reaction to anesthesia    pt's sister has hx. of post-op N/V  . Full dentures   . GERD (gastroesophageal reflux disease) 02/03/2012  . High cholesterol    no current med.  Marland Kitchen History of breast cancer   . Personal history of chemotherapy 1997  . Runny nose 02/09/2015   clear drainage, per pt.    Past Surgical History:  Procedure Laterality Date  . BREAST CAPSULOTOMY WITH IMPLANT EXCHANGE Right 06/21/2013   Procedure: REVISION RIGHT BREAST RECONSTRUCTION/REMOVAL OF RIGHT IMPLANT/RIGHT BREAST CAPSULOTOMY WITH INSERT TISSUE EXPLANDER RIGHT BREAST/POSSIBLE LEFT BREAST MASTOPEXY;  Surgeon: Cristine Polio, MD;  Location: [facility];  Service: Plastics;  Laterality: Right;  . BREAST RECONSTRUCTION Right 06/21/2013   Procedure: BREAST RECONSTRUCTION;  Surgeon: Cristine Polio, MD;  Location: [facility];  Service: Plastics;  Laterality: Right;  . BREAST RECONSTRUCTION Left 11/29/2013   Procedure: LEFT MASTOPEXY FOR RECONSTRUCTION;   Surgeon: Cristine Polio, MD;  Location: [facility];  Service: Plastics;  Laterality: Left;  . BREAST RECONSTRUCTION Right 03/14/2014   Procedure: RECONSTRUCTION NIPPLE RIGHT BREAST;  Surgeon: Cristine Polio, MD;  Location: [facility];  Service: Plastics;  Laterality: Right;  . BREAST SURGERY  97   implant  . COLONOSCOPY    . MASTECTOMY, PARTIAL Right 1997   -node dissection  . REMOVAL OF TISSUE EXPANDER AND PLACEMENT OF IMPLANT Right 02/13/2015   Procedure: REMOVAL OF TISSUE  EXPANDER  PORT RIGHT BREAST;  Surgeon: Cristine Polio, MD;  Location: [facility];  Service: Plastics;  Laterality: Right;  . SCAR REVISION Right 02/13/2015   Procedure: SCAR REVISION RIGHT BREAST;  Surgeon: Cristine Polio, MD;  Location: [facility];  Service: Plastics;  Laterality: Right;  . TISSUE EXPANDER PLACEMENT Right 03/14/2014   Procedure: SALINE REMOVAL RIGHT TISSUE EXPANDER;  Surgeon: Cristine Polio, MD;  Location: [facility];  Service: Plastics;  Laterality: Right;  . TUBAL LIGATION  1980's    There were no vitals filed for this visit.       Subjective Assessment - 04/15/17 1026    Subjective Patient has had chronic neck pain for >5 yrs.  She has pain, sensory symptoms and weakness in bilateral UEs.  She has tried PT before as well as injections and will now see a pain clinic next month.  Her hands cramp up, has trouble typing at school (part time).  She has difficulty  sitting, sleeping, using arms to lift, carry objects.  She is mostly limited in endurance, mobility.  She can do very light housework but cooking tires her out.    Pertinent History COPD, DDD, asthma, Breast Cancer hx   Limitations Sitting;Reading;Lifting;Writing;House hold activities   How long can you sit comfortably? has to reposition frequently   How long can you stand comfortably? <10 min    How long can you walk comfortably? <10 min   Diagnostic tests CT  in 2016   Patient Stated Goals Pt would like to avoid surgery and ease pain.     Currently in Pain? Yes   Pain Score 10-Worst pain ever   Pain Location Neck   Pain Orientation Left;Right;Posterior   Pain Descriptors / Indicators Aching;Sore   Pain Type Chronic pain   Pain Radiating Towards arms bilaterally   Pain Onset More than a month ago   Pain Frequency Constant   Aggravating Factors  sitting, using arms    Pain Relieving Factors heating pad, nothign else really             [facility] PT Assessment - 04/15/17 0001      Assessment   Medical Diagnosis cervical spine arthritis    Referring Provider Isidore Moos   Onset Date/Surgical Date --  chronic   Hand Dominance Right   Next MD Visit unknown, Pain clinic upcoming   Prior Therapy Yes     Precautions   Precautions None     Restrictions   Weight Bearing Restrictions No     Balance Screen   Has the patient fallen in the past 6 months No   How many times? 0  loses balance at times    Has the patient had a decrease in activity level because of a fear of falling?  No   Is the patient reluctant to leave their home because of a fear of falling?  No  due to pain and COPD      Home Environment   Living Environment Private residence   Living Arrangements Alone   Available Help at Discharge Family     Prior Function   Level of Independence Independent with basic ADLs;Needs assistance with homemaking   Vocation Requirements unable to work   Leisure limited due to chronic health issues      Observation/Other Assessments   Focus on Therapeutic Outcomes (FOTO)  62%     Sensation   Light Touch Appears Intact;Impaired by gross assessment   Additional Comments reports bilateral arm and hand numbness, most of the time     Posture/Postural Control   Posture/Postural Control Postural limitations   Postural Limitations Rounded Shoulders;Forward head     AROM   Cervical Flexion 42   Cervical Extension 45  increased pain     Cervical - Right Side Bend 26   Cervical - Left Side Bend 20   Cervical - Right Rotation 70   Cervical - Left Rotation 65     PROM   Overall PROM Comments pain rotation and sidebending to L      Strength   Right Shoulder Flexion 3+/5   Right Shoulder ABduction 3/5   Left Shoulder Flexion 3+/5   Left Shoulder ABduction 3/5   Right Elbow Flexion 4/5   Right Elbow Extension 3+/5   Left Elbow Flexion 4/5   Left Elbow Extension 3+/5     Palpation   Spinal mobility hypomobile cervical spine    Palpation comment painful and tense throughout  upper middle and lower cervical spine      Spurling's   Findings Negative     Distraction Test   Findngs Positive   Comment reduced pain and sesnory sx with supine manual traction            Objective measurements completed on examination: See above findings.          [facility] Adult PT Treatment/Exercise - 04/15/17 0001      Self-Care   Lifting safety, arm positioning   Posture alignment   Heat/Ice Application heat ot relax mm   Other Self-Care Comments  HEP, POC      Moist Heat Therapy   Number Minutes Moist Heat 12 Minutes   Moist Heat Location Cervical     Electrical Stimulation   Electrical Stimulation Location neck posterior   Electrical Stimulation Action IFC   Electrical Stimulation Parameters to tol   Electrical Stimulation Goals Pain     Manual Therapy   Manual Therapy Joint mobilization;Myofascial release;Passive ROM;Manual Traction   Myofascial Release upper cervical and upper traps   Passive ROM rotation and sidebending   Manual Traction gentle in neutral and with slight Rt. sidebending                 PT Education - 04/15/17 2236    Education provided Yes   Education Details PT/POC, HEP, options for conservative care vs surgery, traction   Person(s) Educated Patient   Methods Explanation;Demonstration;Verbal cues;Handout   Comprehension Verbalized understanding;Need further instruction;Verbal  cues required          PT Short Term Goals - 04/15/17 2242      PT SHORT TERM GOAL #1   Title independent with initial HEP   Time 4   Period Weeks   Status New   Target Date 05/13/17     PT SHORT TERM GOAL #2   Title Pt will be able to report improved ability to do light housework due to improvement in pain, muscular endurance   Time 4   Period Weeks   Status New   Target Date 05/13/17           PT Long Term Goals - 04/15/17 2244      PT LONG TERM GOAL #1   Title understand proper posture and body mechanics as it pertains to prevention of further neck pain.    Time 8   Period Weeks   Status New   Target Date 06/10/17     PT LONG TERM GOAL #2   Title decrease pain 50% with normal daily activities   Time 8   Period Weeks   Status New   Target Date 06/10/17     PT LONG TERM GOAL #3   Title increase ROM of the cervical spine by 20 degrees in rotation bilaterally with no lasting pain increase.    Time 8   Period Weeks   Status New   Target Date 06/10/17     PT LONG TERM GOAL #4   Title Pt will increase UE strength to 4/5 bilaterally for improved ability to lift, carry light items safely.    Time 8   Period Weeks   Status New   Target Date 06/10/17                Plan - 04/15/17 2237    Clinical Impression Statement Mrs. Hedges presents with chronic neck pain due to arthritis.  She has had min success in the past.  She is  not willing to consider surgery but is limited by pain and sensory disturbance in both UEs.  She has weakness and overall fatigue due to COPD and deconditioning.  She did report mild reduction of UE sx with supine manual traction.  She also reported relief post IFC.  She may benefit from Home TENS as a solution to long term pain control.    Clinical Presentation Stable   Clinical Decision Making Low   Rehab Potential Good   PT Frequency 2x / week   PT Duration 8 weeks   PT Treatment/Interventions ADLs/Self Care Home  Management;Patient/family education;Dry needling;Taping;Cryotherapy;Functional mobility training;Manual techniques;Therapeutic activities;Electrical Stimulation;Therapeutic exercise;Balance training;Moist Heat;Traction;Ultrasound;Neuromuscular re-education;Passive range of motion  balance screen   PT Next Visit Plan review HEP, manual vs mechanical traction.  Try UBE or Nustep.  repeat IFC?    PT Home Exercise Plan tension stretching    Consulted and Agree with Plan of Care Patient      Patient will benefit from skilled therapeutic intervention in order to improve the following deficits and impairments:  Cardiopulmonary status limiting activity, Decreased activity tolerance, Decreased balance, Decreased range of motion, Impaired flexibility, Postural dysfunction, Improper body mechanics, Impaired perceived functional ability, Decreased mobility, Decreased strength, Increased fascial restricitons, Impaired UE functional use, Pain, Hypomobility, Decreased endurance  Visit Diagnosis: Cervicalgia  Muscle weakness (generalized)  Other disturbances of skin sensation      G-Codes - 04/26/2017 2249    Functional Assessment Tool Used (Outpatient Only) FOTO   Carrying, Moving and Handling Objects Current Status (O4784) At least 60 percent but less than 80 percent impaired, limited or restricted   Carrying, Moving and Handling Objects Goal Status (X2820) At least 40 percent but less than 60 percent impaired, limited or restricted       Problem List Patient Active Problem List   Diagnosis Date Noted  . Encounter for HCV screening test for low risk patient 03/10/2017  . History of hyperkalemia 02/13/2016  . Skin lesion of back 11/15/2014  . Atypical chest pain 10/21/2014  . Cataract 10/21/2014  . Dyslipidemia, goal to be determined 04/22/2014  . Lipoma 10/07/2012  . Cervical spine arthritis ([place]) 08/27/2012  . History of breast cancer 07/23/2012  . GERD (gastroesophageal reflux disease)  02/03/2012  . COPD GOLD IV 06/24/2011  . Liver cyst 06/24/2011  . Insomnia 06/24/2011    Seeley Southgate 04-26-17, 10:53 PM  [facility] [address] [place], [place], [address] Phone: [phone]   Fax:  [phone]  Name: YAMEL BALE MRN: [id number] Date of Birth: [birth year]   Raeford Razor, PT 26-Apr-2017 10:53 PM Phone: [phone] Fax: [phone]

## 2017-04-15 NOTE — Patient Instructions (Signed)
  Flexibility: Upper Trapezius Stretch   Gently grasp right side of head while reaching behind back with other hand. Tilt head away until a gentle stretch is felt. Hold __30__ seconds. Repeat _3___ times per set. Do __1__ sets per session. Do __2__ sessions per day.  DO not pull on your head   [email or web address]://[email or web address]/340   Levator Stretch   Grasp seat or sit on hand on side to be stretched. Turn head toward other side and look down. Use hand on head to gently stretch neck in that position. Hold __30__ seconds. Repeat on other side. Repeat __3__ times. Do __2__ sessions per day. DO not pull on head , go gently  [email or web address]   Scapular Retraction (Standing)   With arms at sides, pinch shoulder blades together. Repeat ___10_ times per set. Do ___1_ sets per session. Do ___2_ sessions per day.  [email or web address]://[email or web address].[email or web address]/944   Flexibility: Neck Retraction   Pull head straight back, keeping eyes and jaw level. Repeat _10___ times per set. Do __1_ sets per session. Do ___2_ sessions per day.  [email or web address]://[email or web address]/344   Posture - Sitting   Sit upright, head facing forward. Try using a roll to support lower back. Keep shoulders relaxed, and avoid rounded back. Keep hips level with knees. Avoid crossing legs for long periods.

## 2017-04-18 ENCOUNTER — Other Ambulatory Visit: Payer: Self-pay | Admitting: Internal Medicine

## 2017-04-18 ENCOUNTER — Other Ambulatory Visit: Payer: Self-pay | Admitting: *Deleted

## 2017-04-18 DIAGNOSIS — J449 Chronic obstructive pulmonary disease, unspecified: Secondary | ICD-10-CM

## 2017-04-18 MED ORDER — BUDESONIDE-FORMOTEROL FUMARATE 160-4.5 MCG/ACT IN AERO: 2.0000 | INHALATION_SPRAY | Freq: Two times a day (BID) | RESPIRATORY_TRACT | 3 refills | Status: DC

## 2017-04-18 NOTE — Telephone Encounter (Signed)
Refill Request  Symbicort

## 2017-04-25 ENCOUNTER — Ambulatory Visit (INDEPENDENT_AMBULATORY_CARE_PROVIDER_SITE_OTHER): Payer: Medicare Other | Admitting: Internal Medicine

## 2017-04-25 ENCOUNTER — Encounter: Payer: Self-pay | Admitting: Internal Medicine

## 2017-04-25 VITALS — BP 103/72 | HR 76 | Temp 97.8°F | Resp 96 | Ht 66.0 in | Wt 106.1 lb

## 2017-04-25 DIAGNOSIS — Z87891 Personal history of nicotine dependence: Secondary | ICD-10-CM | POA: Diagnosis not present

## 2017-04-25 DIAGNOSIS — M4692 Unspecified inflammatory spondylopathy, cervical region: Secondary | ICD-10-CM

## 2017-04-25 DIAGNOSIS — Z79899 Other long term (current) drug therapy: Secondary | ICD-10-CM | POA: Diagnosis not present

## 2017-04-25 DIAGNOSIS — E785 Hyperlipidemia, unspecified: Secondary | ICD-10-CM | POA: Diagnosis not present

## 2017-04-25 DIAGNOSIS — Z8249 Family history of ischemic heart disease and other diseases of the circulatory system: Secondary | ICD-10-CM | POA: Diagnosis not present

## 2017-04-25 DIAGNOSIS — J449 Chronic obstructive pulmonary disease, unspecified: Secondary | ICD-10-CM

## 2017-04-25 DIAGNOSIS — M47812 Spondylosis without myelopathy or radiculopathy, cervical region: Secondary | ICD-10-CM

## 2017-04-25 DIAGNOSIS — F332 Major depressive disorder, recurrent severe without psychotic features: Secondary | ICD-10-CM | POA: Insufficient documentation

## 2017-04-25 MED ORDER — SERTRALINE HCL 50 MG PO TABS: ORAL_TABLET | ORAL | 2 refills | Status: DC

## 2017-04-25 MED ORDER — PREGABALIN 75 MG PO CAPS: 75.0000 mg | ORAL_CAPSULE | Freq: Two times a day (BID) | ORAL | 1 refills | Status: DC

## 2017-04-25 NOTE — Assessment & Plan Note (Signed)
The patient stated that she wanted to discuss the possibility of her having PTSD. She mentioned that a friend who has PTSD recommended that she speak to her doctor about whether she may have this.   The patient scored a 15 on PHQ9 which puts her in the moderate-severe depressive range. The patient stated that she has been having difficulty sleeping for the past few months. She used to like taking her grandchildren out around town and to the park, but no longer feels like doing so. She spends most of her time in her house apart from the time she goes to her GED classes. Her appetite has decreased and she has concentration difficulties. She does not have any suicidal or homicidal ideation.   -Patient was started on sertraline 40m for the first week and then instructed to take 2 tablets (1061m thereafter -Will reassess in 4 weeks

## 2017-04-25 NOTE — Patient Instructions (Addendum)
Thank you for allowing me to be a part of your care. It was great to meet you today! During your visit we have  1) Changed your medication from Gabapentin to Lyrica (Pregabalin).  2) Diagnosed you with Major Depressive Disorder and started you on Sertraline. Please take one 64m tablet the first week and then 2 538mtablets (10040m   Please follow up in our clinic in 4 weeks. Thank you!  Pregabalin capsules What is this medicine? PREGABALIN (pre GAB a lin) is used to treat nerve pain from diabetes, shingles, spinal cord injury, and fibromyalgia. It is also used to control seizures in epilepsy. This medicine may be used for other purposes; ask your health care provider or pharmacist if you have questions. COMMON BRAND NAME(S): Lyrica What should I tell my health care provider before I take this medicine? They need to know if you have any of these conditions: -bleeding problems -heart disease, including heart failure -history of alcohol or drug abuse -kidney disease -suicidal thoughts, plans, or attempt; a previous suicide attempt by you or a family member -an unusual or allergic reaction to pregabalin, gabapentin, other medicines, foods, dyes, or preservatives -pregnant or trying to get pregnant or trying to conceive with your partner -breast-feeding How should I use this medicine? Take this medicine by mouth with a glass of water. Follow the directions on the prescription label. You can take this medicine with or without food. Take your doses at regular intervals. Do not take your medicine more often than directed. Do not stop taking except on your doctor's advice. A special MedGuide will be given to you by the pharmacist with each prescription and refill. Be sure to read this information carefully each time. Talk to your pediatrician regarding the use of this medicine in children. Special care may be needed. Overdosage: If you think you have taken too much of this medicine contact a poison  control center or emergency room at once. NOTE: This medicine is only for you. Do not share this medicine with others. What if I miss a dose? If you miss a dose, take it as soon as you can. If it is almost time for your next dose, take only that dose. Do not take double or extra doses. What may interact with this medicine? -alcohol -certain medicines for blood pressure like captopril, enalapril, or lisinopril -certain medicines for diabetes, like pioglitazone or rosiglitazone -certain medicines for anxiety or sleep -narcotic medicines for pain This list may not describe all possible interactions. Give your health care provider a list of all the medicines, herbs, non-prescription drugs, or dietary supplements you use. Also tell them if you smoke, drink alcohol, or use illegal drugs. Some items may interact with your medicine. What should I watch for while using this medicine? Tell your doctor or healthcare professional if your symptoms do not start to get better or if they get worse. Visit your doctor or health care professional for regular checks on your progress. Do not stop taking except on your doctor's advice. You may develop a severe reaction. Your doctor will tell you how much medicine to take. Wear a medical identification bracelet or chain if you are taking this medicine for seizures, and carry a card that describes your disease and details of your medicine and dosage times. You may get drowsy or dizzy. Do not drive, use machinery, or do anything that needs mental alertness until you know how this medicine affects you. Do not stand or sit up quickly, especially  if you are an older patient. This reduces the risk of dizzy or fainting spells. Alcohol may interfere with the effect of this medicine. Avoid alcoholic drinks. If you have a heart condition, like congestive heart failure, and notice that you are retaining water and have swelling in your hands or feet, contact your health care provider  immediately. The use of this medicine may increase the chance of suicidal thoughts or actions. Pay special attention to how you are responding while on this medicine. Any worsening of mood, or thoughts of suicide or dying should be reported to your health care professional right away. This medicine has caused reduced sperm counts in some men. This may interfere with the ability to father a child. You should talk to your doctor or health care professional if you are concerned about your fertility. Women who become pregnant while using this medicine for seizures may enroll in the [place] Pregnancy Registry by calling [phone]. This registry collects information about the safety of antiepileptic drug use during pregnancy. What side effects may I notice from receiving this medicine? Side effects that you should report to your doctor or health care professional as soon as possible: -allergic reactions like skin rash, itching or hives, swelling of the face, lips, or tongue -breathing problems -changes in vision -chest pain -confusion -jerking or unusual movements of any part of your body -loss of memory -muscle pain, tenderness, or weakness -suicidal thoughts or other mood changes -swelling of the ankles, feet, hands -unusual bruising or bleeding Side effects that usually do not require medical attention (report to your doctor or health care professional if they continue or are bothersome): -dizziness -drowsiness -dry mouth -headache -nausea -tremors -trouble sleeping -weight gain This list may not describe all possible side effects. Call your doctor for medical advice about side effects. You may report side effects to FDA at 1-800-FDA-1088. Where should I keep my medicine? Keep out of the reach of children. This medicine can be abused. Keep your medicine in a safe place to protect it from theft. Do not share this medicine with anyone. Selling or giving away this  medicine is dangerous and against the law. This medicine may cause accidental overdose and death if it taken by other adults, children, or pets. Mix any unused medicine with a substance like cat litter or coffee grounds. Then throw the medicine away in a sealed container like a sealed bag or a coffee can with a lid. Do not use the medicine after the expiration date. Store at room temperature between 15 and 30 degrees C (59 and 86 degrees F). NOTE: This sheet is a summary. It may not cover all possible information. If you have questions about this medicine, talk to your doctor, pharmacist, or health care provider.  2018 Elsevier/Gold Standard (2015-10-12 10:26:12)  Sertraline tablets What is this medicine? SERTRALINE (SER tra leen) is used to treat depression. It may also be used to treat obsessive compulsive disorder, panic disorder, post-trauma stress, premenstrual dysphoric disorder (PMDD) or social anxiety. This medicine may be used for other purposes; ask your health care provider or pharmacist if you have questions. COMMON BRAND NAME(S): Zoloft What should I tell my health care provider before I take this medicine? They need to know if you have any of these conditions: -bleeding disorders -bipolar disorder or a family history of bipolar disorder -glaucoma -heart disease -high blood pressure -history of irregular heartbeat -history of low levels of calcium, magnesium, or potassium in the blood -if  you often drink alcohol -liver disease -receiving electroconvulsive therapy -seizures -suicidal thoughts, plans, or attempt; a previous suicide attempt by you or a family member -take medicines that treat or prevent blood clots -thyroid disease -an unusual or allergic reaction to sertraline, other medicines, foods, dyes, or preservatives -pregnant or trying to get pregnant -breast-feeding How should I use this medicine? Take this medicine by mouth with a glass of water. Follow the  directions on the prescription label. You can take it with or without food. Take your medicine at regular intervals. Do not take your medicine more often than directed. Do not stop taking this medicine suddenly except upon the advice of your doctor. Stopping this medicine too quickly may cause serious side effects or your condition may worsen. A special MedGuide will be given to you by the pharmacist with each prescription and refill. Be sure to read this information carefully each time. Talk to your pediatrician regarding the use of this medicine in children. While this drug may be prescribed for children as young as 7 years for selected conditions, precautions do apply. Overdosage: If you think you have taken too much of this medicine contact a poison control center or emergency room at once. NOTE: This medicine is only for you. Do not share this medicine with others. What if I miss a dose? If you miss a dose, take it as soon as you can. If it is almost time for your next dose, take only that dose. Do not take double or extra doses. What may interact with this medicine? Do not take this medicine with any of the following medications: -cisapride -dofetilide -dronedarone -linezolid -MAOIs like Carbex, Eldepryl, Marplan, Nardil, and Parnate -methylene blue (injected into a vein) -pimozide -thioridazine This medicine may also interact with the following medications: -alcohol -amphetamines -aspirin and aspirin-like medicines -certain medicines for depression, anxiety, or psychotic disturbances -certain medicines for fungal infections like ketoconazole, fluconazole, posaconazole, and itraconazole -certain medicines for irregular heart beat like flecainide, quinidine, propafenone -certain medicines for migraine headaches like almotriptan, eletriptan, frovatriptan, naratriptan, rizatriptan, sumatriptan, zolmitriptan -certain medicines for sleep -certain medicines for seizures like carbamazepine,  valproic acid, phenytoin -certain medicines that treat or prevent blood clots like warfarin, enoxaparin, dalteparin -cimetidine -digoxin -diuretics -fentanyl -isoniazid -lithium -NSAIDs, medicines for pain and inflammation, like ibuprofen or naproxen -other medicines that prolong the QT interval (cause an abnormal heart rhythm) -rasagiline -safinamide -supplements like St. John's wort, kava kava, valerian -tolbutamide -tramadol -tryptophan This list may not describe all possible interactions. Give your health care provider a list of all the medicines, herbs, non-prescription drugs, or dietary supplements you use. Also tell them if you smoke, drink alcohol, or use illegal drugs. Some items may interact with your medicine. What should I watch for while using this medicine? Tell your doctor if your symptoms do not get better or if they get worse. Visit your doctor or health care professional for regular checks on your progress. Because it may take several weeks to see the full effects of this medicine, it is important to continue your treatment as prescribed by your doctor. Patients and their families should watch out for new or worsening thoughts of suicide or depression. Also watch out for sudden changes in feelings such as feeling anxious, agitated, panicky, irritable, hostile, aggressive, impulsive, severely restless, overly excited and hyperactive, or not being able to sleep. If this happens, especially at the beginning of treatment or after a change in dose, call your health care professional. You may get  drowsy or dizzy. Do not drive, use machinery, or do anything that needs mental alertness until you know how this medicine affects you. Do not stand or sit up quickly, especially if you are an older patient. This reduces the risk of dizzy or fainting spells. Alcohol may interfere with the effect of this medicine. Avoid alcoholic drinks. Your mouth may get dry. Chewing sugarless gum or sucking  hard candy, and drinking plenty of water may help. Contact your doctor if the problem does not go away or is severe. What side effects may I notice from receiving this medicine? Side effects that you should report to your doctor or health care professional as soon as possible: -allergic reactions like skin rash, itching or hives, swelling of the face, lips, or tongue -anxious -black, tarry stools -changes in vision -confusion -elevated mood, decreased need for sleep, racing thoughts, impulsive behavior -eye pain -fast, irregular heartbeat -feeling faint or lightheaded, falls -feeling agitated, angry, or irritable -hallucination, loss of contact with reality -loss of balance or coordination -loss of memory -painful or prolonged erections -restlessness, pacing, inability to keep still -seizures -stiff muscles -suicidal thoughts or other mood changes -trouble sleeping -unusual bleeding or bruising -unusually weak or tired -vomiting Side effects that usually do not require medical attention (report to your doctor or health care professional if they continue or are bothersome): -change in appetite or weight -change in sex drive or performance -diarrhea -increased sweating -indigestion, nausea -tremors This list may not describe all possible side effects. Call your doctor for medical advice about side effects. You may report side effects to FDA at 1-800-FDA-1088. Where should I keep my medicine? Keep out of the reach of children. Store at room temperature between 15 and 30 degrees C (59 and 86 degrees F). Throw away any unused medicine after the expiration date. NOTE: This sheet is a summary. It may not cover all possible information. If you have questions about this medicine, talk to your doctor, pharmacist, or health care provider.  2018 Elsevier/Gold Standard (2016-09-13 14:17:49)   Major Depressive Disorder, Adult Major depressive disorder (MDD) is a mental health condition.  MDD often makes you feel sad, hopeless, or helpless. MDD can also cause symptoms in your body. MDD can affect your:  Work.  School.  Relationships.  Other normal activities.  MDD can range from mild to very bad. It may occur once (single episode MDD). It can also occur many times (recurrent MDD). The main symptoms of MDD often include:  Feeling sad, depressed, or irritable most of the time.  Loss of interest.  MDD symptoms also include:  Sleeping too much or too little.  Eating too much or too little.  A change in your weight.  Feeling tired (fatigue) or having low energy.  Feeling worthless.  Feeling guilty.  Trouble making decisions.  Trouble thinking clearly.  Thoughts of suicide or harming others.  Feeling weak.  Feeling agitated.  Keeping yourself from being around other people (isolation).  Follow these instructions at home: Activity  Do these things as told by your doctor: ? Go back to your normal activities. ? Exercise regularly. ? Spend time outdoors. Alcohol  Talk with your doctor about how alcohol can affect your antidepressant medicines.  Do not drink alcohol. Or, limit how much alcohol you drink. ? This means no more than 1 drink a day for nonpregnant women and 2 drinks a day for men. One drink equals one of these:  12 oz of beer.  5 oz  of wine.  1 oz of hard liquor. General instructions  Take over-the-counter and prescription medicines only as told by your doctor.  Eat a healthy diet.  Get plenty of sleep.  Find activities that you enjoy. Make time to do them.  Think about joining a support group. Your doctor may be able to suggest a group for you.  Keep all follow-up visits as told by your doctor. This is important. Where to find more information:  [employer] on Mental Illness: ? www.nami.[place]: ? https://carter.com/  National Suicide Prevention Lifeline: ? 8304787228.  This is free, 24-hour help. Contact a doctor if:  Your symptoms get worse.  You have new symptoms. Get help right away if:  You self-harm.  You see, hear, taste, smell, or feel things that are not present (hallucinate). If you ever feel like you may hurt yourself or others, or have thoughts about taking your own life, get help right away. You can go to your nearest emergency department or call:  Your local emergency services (911 in the [place]  A suicide crisis helpline, such as the National Suicide Prevention Lifeline: ? 667-097-9342. This is open 24 hours a day.  This information is not intended to replace advice given to you by your health care provider. Make sure you discuss any questions you have with your health care provider. Document Released: 08/21/2015 Document Revised: 05/26/2016 Document Reviewed: 05/26/2016 Elsevier Interactive Patient Education  2017 [employer].

## 2017-04-25 NOTE — Progress Notes (Signed)
CC: Medication refill for Gabapentin and to meet new physician  HPI:  [name] is a 61 y.o. complaining of continued pain in her neck.  Please refer to problem based charting.    Past Medical History:  Diagnosis Date  . Cataract, immature    bilateral  . COPD (chronic obstructive pulmonary disease) ([place])    no home O2  . Cough 02/09/2015  . DDD (degenerative disc disease), cervical   . Exertional shortness of breath    states if she "takes her time" doing activities does not get SOB  . Family history of adverse reaction to anesthesia    pt's sister has hx. of post-op N/V  . Full dentures   . GERD (gastroesophageal reflux disease) 02/03/2012  . High cholesterol    no current med.  Marland Kitchen History of breast cancer   . Personal history of chemotherapy 1997  . Runny nose 02/09/2015   clear drainage, per pt.   Review of Systems:   Review of Systems  Constitutional: Positive for weight loss (states that she was 130lb when she was on steroids).  Eyes: Positive for blurred vision (occasionally, saw her optometrist in 11/2015) and pain.  Respiratory: Positive for shortness of breath (especially when she moves around). Negative for cough.   Cardiovascular: Negative for chest pain and palpitations.  Gastrointestinal: Negative for heartburn, nausea and vomiting.  Neurological: Positive for headaches. Negative for dizziness.    Physical Exam:  There were no vitals filed for this visit.  Physical Exam  Constitutional: She appears well-developed and well-nourished. No distress.  HENT:  Head: Normocephalic and atraumatic.  Eyes: Conjunctivae are normal.  Cardiovascular: Normal rate, regular rhythm, normal heart sounds and intact distal pulses.  Exam reveals no friction rub.   No murmur heard. Pulmonary/Chest: She is in respiratory distress. She has wheezes.  Abdominal: Soft. Bowel sounds are normal. She exhibits no distension. There is no tenderness.  Musculoskeletal: She  exhibits no edema, tenderness or deformity.  Bilateral upper extremity strength 5/5 Patient is unable to shrug her left shoulder as effectively as her right  Skin: She is not diaphoretic.  Psychiatric: Her speech is normal and behavior is normal. She exhibits a depressed mood. She expresses no homicidal and no suicidal ideation.    Assessment & Plan:   See Encounters Tab for problem based charting.  Patient discussed with Dr. Daryll Drown

## 2017-04-25 NOTE — Assessment & Plan Note (Addendum)
Patient is being followed by her pulmonologist. Last seen 02/21/17 Patient continuing to use spiriva, symbicort, and albuterol.  The patient has only had to use albuterol 2-3x per week when she is doing activities

## 2017-04-25 NOTE — Assessment & Plan Note (Addendum)
Patient has not had a lipid panel done in 3 years. Last lipid panel results were on 04/22/14 Total 233/HDL 88/ LDL 132.  -Ordered lipid panel today 04/25/17

## 2017-04-25 NOTE — Assessment & Plan Note (Addendum)
Mary Jenkins states that she continues to have pain in her neck. She states that the pain is 10/10 intensity, sharp in nature, constant, radiating down both arms but more prevalent in left arm. The pain is accompanied with numbness and tingling in her bilateral arms. She said that she went to her first physical therapy session on 04/15/17. She has been practicing the physical therapy exercises daily and has found some relief. She has been using the increased Gabapentin 867m bid as instructed at her last appointment without any relief.   -Stop Gabapentin  -Start Lyrica 776mbid  -Reassess in 4 weeks -Will consider epidural injection per PM&R if she still does not find relief

## 2017-04-26 LAB — LIPID PANEL
CHOL/HDL RATIO: 2 ratio (ref 0.0–4.4)
Cholesterol, Total: 199 mg/dL (ref 100–199)
HDL: 99 mg/dL (ref >39)
LDL CALC: 87 mg/dL (ref 0–99)
Triglycerides: 66 mg/dL (ref 0–149)
VLDL CHOLESTEROL CAL: 13 mg/dL (ref 5–40)

## 2017-04-28 NOTE — Progress Notes (Signed)
Internal Medicine Clinic Attending  I saw and evaluated the patient.  I personally confirmed the key portions of the history and exam documented by Dr. Maricela Bo and I reviewed pertinent patient test results.  The assessment, diagnosis, and plan were formulated together and I agree with the documentation in the resident's note.

## 2017-04-29 ENCOUNTER — Ambulatory Visit: Payer: Medicare Other | Attending: Internal Medicine | Admitting: Physical Therapy

## 2017-04-29 ENCOUNTER — Encounter: Payer: Self-pay | Admitting: Physical Therapy

## 2017-04-29 DIAGNOSIS — M6281 Muscle weakness (generalized): Secondary | ICD-10-CM | POA: Insufficient documentation

## 2017-04-29 DIAGNOSIS — M542 Cervicalgia: Secondary | ICD-10-CM | POA: Insufficient documentation

## 2017-04-29 DIAGNOSIS — R208 Other disturbances of skin sensation: Secondary | ICD-10-CM | POA: Insufficient documentation

## 2017-04-29 NOTE — Patient Instructions (Signed)
From exercise drawer: Decompression 5 minutes, to 15 minutes Shoulder press, with 1 pillow on right 5 x 5 seconds Head press 5 X 5 seconds

## 2017-04-29 NOTE — Therapy (Signed)
[place], [place], [address] Phone: [phone]   Fax:  [phone]  Physical Therapy Treatment  Patient Details  Name: Mary Jenkins MRN: [id number] Date of Birth: 08/25/1956 Referring Provider: Isidore Moos  Encounter Date: 04/29/2017      PT End of Session - 04/29/17 1723    Visit Number 2   Number of Visits 16   PT Start Time 1829   PT Stop Time 1734   PT Time Calculation (min) 59 min   Activity Tolerance Patient tolerated treatment well   Behavior During Therapy [facility] for tasks assessed/performed      Past Medical History:  Diagnosis Date  . Cataract, immature    bilateral  . COPD (chronic obstructive pulmonary disease) ([place])    no home O2  . Cough 02/09/2015  . DDD (degenerative disc disease), cervical   . Exertional shortness of breath    states if she "takes her time" doing activities does not get SOB  . Family history of adverse reaction to anesthesia    pt's sister has hx. of post-op N/V  . Full dentures   . GERD (gastroesophageal reflux disease) 02/03/2012  . High cholesterol    no current med.  Marland Kitchen History of breast cancer   . Personal history of chemotherapy 1997  . Runny nose 02/09/2015   clear drainage, per pt.    Past Surgical History:  Procedure Laterality Date  . BREAST CAPSULOTOMY WITH IMPLANT EXCHANGE Right 06/21/2013   Procedure: REVISION RIGHT BREAST RECONSTRUCTION/REMOVAL OF RIGHT IMPLANT/RIGHT BREAST CAPSULOTOMY WITH INSERT TISSUE EXPLANDER RIGHT BREAST/POSSIBLE LEFT BREAST MASTOPEXY;  Surgeon: Cristine Polio, MD;  Location: [place];  Service: Plastics;  Laterality: Right;  . BREAST RECONSTRUCTION Right 06/21/2013   Procedure: BREAST RECONSTRUCTION;  Surgeon: Cristine Polio, MD;  Location: [place];  Service: Plastics;  Laterality: Right;  . BREAST RECONSTRUCTION Left 11/29/2013   Procedure: LEFT MASTOPEXY FOR RECONSTRUCTION;   Surgeon: Cristine Polio, MD;  Location: [place];  Service: Plastics;  Laterality: Left;  . BREAST RECONSTRUCTION Right 03/14/2014   Procedure: RECONSTRUCTION NIPPLE RIGHT BREAST;  Surgeon: Cristine Polio, MD;  Location: [place];  Service: Plastics;  Laterality: Right;  . BREAST SURGERY  97   implant  . COLONOSCOPY    . MASTECTOMY, PARTIAL Right 1997   -node dissection  . REMOVAL OF TISSUE EXPANDER AND PLACEMENT OF IMPLANT Right 02/13/2015   Procedure: REMOVAL OF TISSUE  EXPANDER  PORT RIGHT BREAST;  Surgeon: Cristine Polio, MD;  Location: [place];  Service: Plastics;  Laterality: Right;  . SCAR REVISION Right 02/13/2015   Procedure: SCAR REVISION RIGHT BREAST;  Surgeon: Cristine Polio, MD;  Location: [place];  Service: Plastics;  Laterality: Right;  . TISSUE EXPANDER PLACEMENT Right 03/14/2014   Procedure: SALINE REMOVAL RIGHT TISSUE EXPANDER;  Surgeon: Cristine Polio, MD;  Location: [place];  Service: Plastics;  Laterality: Right;  . TUBAL LIGATION  1980's    There were no vitals filed for this visit.      Subjective Assessment - 04/29/17 1642    Subjective New pain between shoulder baldes 9/10 woke up with it today. She has been able to do her light stretches since th last visit.   Currently in Pain? Yes   Pain Score --  no number given  when asked  "It is OK today until I make the wroing move"   Pain  Location Neck   Pain Orientation Left  LT>Right   Pain Descriptors / Indicators Aching;Sore;Numbness   Pain Type Chronic pain   Pain Radiating Towards sometimes both,  mostly left all her fingers get numb   Pain Frequency Constant   Aggravating Factors  truning head wrong or making the wrong move.   Pain Relieving Factors heat                         [facility] Adult PT Treatment/Exercise - 04/29/17 0001      Shoulder Exercises: Supine   Other Supine Exercises  Decompression, 5 minutes,  shoulder and head press 5 X 5 seconds.  1 pillow under right arm to decrease numbness     Shoulder Exercises: ROM/Strengthening   UBE (Upper Arm Bike) Nu step L1 X 5 minutes     Shoulder Exercises: IT sales professional Limitations 1 x 30 seconds in doorway     Moist Heat Therapy   Number Minutes Moist Heat 15 Minutes   Moist Heat Location Cervical  and upper back sitting     Manual Therapy   Manual therapy comments several tight areas softened,  light trigger point release    Soft tissue mobilization neck , both upper traps and paraspinals, peri scapular.   Passive ROM pec stretch and shoulder depression.   Manual Traction gentle distraction 5 X 10 seconds gentle holds                 PT Education - 04/29/17 1722    Education provided Yes   Education Details HEP   Person(s) Educated Patient   Methods Explanation;Tactile cues;Verbal cues;Handout   Comprehension Verbalized understanding;Returned demonstration          PT Short Term Goals - 04/29/17 1726      PT SHORT TERM GOAL #1   Title independent with initial HEP   Baseline independent with exercises issued so far   Time 4   Period Weeks   Status On-going     PT SHORT TERM GOAL #2   Title Pt will be able to report improved ability to do light housework due to improvement in pain, muscular endurance   Time 4   Period Weeks   Status Unable to assess           PT Long Term Goals - 04/15/17 2244      PT LONG TERM GOAL #1   Title understand proper posture and body mechanics as it pertains to prevention of further neck pain.    Time 8   Period Weeks   Status New   Target Date 06/10/17     PT LONG TERM GOAL #2   Title decrease pain 50% with normal daily activities   Time 8   Period Weeks   Status New   Target Date 06/10/17     PT LONG TERM GOAL #3   Title increase ROM of the cervical spine by 20 degrees in rotation bilaterally with no lasting pain increase.     Time 8   Period Weeks   Status New   Target Date 06/10/17     PT LONG TERM GOAL #4   Title Pt will increase UE strength to 4/5 bilaterally for improved ability to lift, carry light items safely.    Time 8   Period Weeks   Status New   Target Date 06/10/17  Plan - 04/29/17 1724    Clinical Impression Statement Pregress toward zHome exercise goals.  she is compliant.  pain continues with use of computer,  she has a long test tomorrow and plans to stretch often.  right numbness in arm decreased with the use of a pillow under arm during decompression.    PT Next Visit Plan review HEP, manual vs mechanical traction.  Try UBE or Nustep.  repeat IFC?    PT Home Exercise Plan tension stretching  decompression, shoulder and head press   Consulted and Agree with Plan of Care Patient      Patient will benefit from skilled therapeutic intervention in order to improve the following deficits and impairments:  Cardiopulmonary status limiting activity, Decreased activity tolerance, Decreased balance, Decreased range of motion, Impaired flexibility, Postural dysfunction, Improper body mechanics, Impaired perceived functional ability, Decreased mobility, Decreased strength, Increased fascial restricitons, Impaired UE functional use, Pain, Hypomobility, Decreased endurance  Visit Diagnosis: Cervicalgia  Muscle weakness (generalized)  Other disturbances of skin sensation     Problem List Patient Active Problem List   Diagnosis Date Noted  . Severe episode of recurrent major depressive disorder, without psychotic features ([place]) 04/25/2017  . Encounter for HCV screening test for low risk patient 03/10/2017  . History of hyperkalemia 02/13/2016  . Skin lesion of back 11/15/2014  . Atypical chest pain 10/21/2014  . Cataract 10/21/2014  . Dyslipidemia, goal to be determined 04/22/2014  . Lipoma 10/07/2012  . Cervical spine arthritis ([place]) 08/27/2012  . History of breast  cancer 07/23/2012  . GERD (gastroesophageal reflux disease) 02/03/2012  . COPD GOLD IV 06/24/2011  . Liver cyst 06/24/2011  . Insomnia 06/24/2011    HARRIS,KAREN PTA 04/29/2017, 5:28 PM  [facility] [address] [place], [place], [address] Phone: [phone]   Fax:  [phone]  Name: Mary Jenkins MRN: [id number] Date of Birth: 09-Oct-1955

## 2017-05-01 ENCOUNTER — Ambulatory Visit: Payer: Medicare Other | Admitting: Physical Therapy

## 2017-05-01 DIAGNOSIS — R208 Other disturbances of skin sensation: Secondary | ICD-10-CM | POA: Diagnosis not present

## 2017-05-01 DIAGNOSIS — M6281 Muscle weakness (generalized): Secondary | ICD-10-CM | POA: Diagnosis not present

## 2017-05-01 DIAGNOSIS — M542 Cervicalgia: Secondary | ICD-10-CM | POA: Diagnosis not present

## 2017-05-01 NOTE — Patient Instructions (Signed)
Cervical stabilization II issued from exercise drawer.  First 3 added.   5-10 x each May use light weights. Daily to 2 x a day.

## 2017-05-01 NOTE — Therapy (Signed)
[place], [place], [address] Phone: [phone]   Fax:  [phone]  Physical Therapy Treatment  Patient Details  Name: Mary Jenkins MRN: [id number] Date of Birth: 08-16-1956 Referring Provider: Isidore Moos  Encounter Date: 05/01/2017      PT End of Session - 05/01/17 1736    Visit Number 3   Number of Visits 16   PT Start Time 1633   PT Stop Time 1736   PT Time Calculation (min) 63 min   Activity Tolerance Patient tolerated treatment well   Behavior During Therapy [facility] for tasks assessed/performed      Past Medical History:  Diagnosis Date  . Cataract, immature    bilateral  . COPD (chronic obstructive pulmonary disease) ([place])    no home O2  . Cough 02/09/2015  . DDD (degenerative disc disease), cervical   . Exertional shortness of breath    states if she "takes her time" doing activities does not get SOB  . Family history of adverse reaction to anesthesia    pt's sister has hx. of post-op N/V  . Full dentures   . GERD (gastroesophageal reflux disease) 02/03/2012  . High cholesterol    no current med.  Marland Kitchen History of breast cancer   . Personal history of chemotherapy 1997  . Runny nose 02/09/2015   clear drainage, per pt.    Past Surgical History:  Procedure Laterality Date  . BREAST CAPSULOTOMY WITH IMPLANT EXCHANGE Right 06/21/2013   Procedure: REVISION RIGHT BREAST RECONSTRUCTION/REMOVAL OF RIGHT IMPLANT/RIGHT BREAST CAPSULOTOMY WITH INSERT TISSUE EXPLANDER RIGHT BREAST/POSSIBLE LEFT BREAST MASTOPEXY;  Surgeon: Cristine Polio, MD;  Location: [place];  Service: Plastics;  Laterality: Right;  . BREAST RECONSTRUCTION Right 06/21/2013   Procedure: BREAST RECONSTRUCTION;  Surgeon: Cristine Polio, MD;  Location: [place];  Service: Plastics;  Laterality: Right;  . BREAST RECONSTRUCTION Left 11/29/2013   Procedure: LEFT MASTOPEXY FOR RECONSTRUCTION;   Surgeon: Cristine Polio, MD;  Location: [place];  Service: Plastics;  Laterality: Left;  . BREAST RECONSTRUCTION Right 03/14/2014   Procedure: RECONSTRUCTION NIPPLE RIGHT BREAST;  Surgeon: Cristine Polio, MD;  Location: [place];  Service: Plastics;  Laterality: Right;  . BREAST SURGERY  97   implant  . COLONOSCOPY    . MASTECTOMY, PARTIAL Right 1997   -node dissection  . REMOVAL OF TISSUE EXPANDER AND PLACEMENT OF IMPLANT Right 02/13/2015   Procedure: REMOVAL OF TISSUE  EXPANDER  PORT RIGHT BREAST;  Surgeon: Cristine Polio, MD;  Location: [place];  Service: Plastics;  Laterality: Right;  . SCAR REVISION Right 02/13/2015   Procedure: SCAR REVISION RIGHT BREAST;  Surgeon: Cristine Polio, MD;  Location: [place];  Service: Plastics;  Laterality: Right;  . TISSUE EXPANDER PLACEMENT Right 03/14/2014   Procedure: SALINE REMOVAL RIGHT TISSUE EXPANDER;  Surgeon: Cristine Polio, MD;  Location: [place];  Service: Plastics;  Laterality: Right;  . TUBAL LIGATION  1980's    There were no vitals filed for this visit.                       [facility] Adult PT Treatment/Exercise - 05/01/17 0001      Neck Exercises: Seated   Cervical Isometrics Flexion;Extension;5 secs;5 reps   Cervical Rotation 5 reps  witrh whole trunk rotation     Neck Exercises: Supine   Other Supine Exercise Cervical stabilization exercises,  2 LB weights : circles,  flexion /extension, horizontal abduction and  adduction 5 X each,  added to HEP     Shoulder Exercises: Supine   Other Supine Exercises placed decompression exercises on hold due to pain into arm      Shoulder Exercises: ROM/Strengthening   UBE (Upper Arm Bike) Nu step L1 X 5 minutes     Shoulder Exercises: Stretch   Other Shoulder Stretches Trunk YoGa     Moist Heat Therapy   Number Minutes Moist Heat 15 Minutes   Moist Heat Location Cervical      Electrical Stimulation   Electrical Stimulation Location Neck posterior   Electrical Stimulation Action IFC   Electrical Stimulation Parameters 4   Electrical Stimulation Goals Pain     Manual Therapy   Manual therapy comments increased pain unable to help her relax today  Diaphragm taping to assist breathing  for exercise   Soft tissue mobilization neck, upper trap   Manual Traction gentle distraction 3 x stopped due to increased radiating pain right.  ,  decreased with rotation to left with flexion                PT Education - 05/01/17 1736    Education provided Yes   Education Details HEP   Person(s) Educated Patient   Methods Explanation;Demonstration;Verbal cues;Handout   Comprehension Verbalized understanding;Returned demonstration          PT Short Term Goals - 04/29/17 1726      PT SHORT TERM GOAL #1   Title independent with initial HEP   Baseline independent with exercises issued so far   Time 4   Period Weeks   Status On-going     PT SHORT TERM GOAL #2   Title Pt will be able to report improved ability to do light housework due to improvement in pain, muscular endurance   Time 4   Period Weeks   Status Unable to assess           PT Long Term Goals - 04/15/17 2244      PT LONG TERM GOAL #1   Title understand proper posture and body mechanics as it pertains to prevention of further neck pain.    Time 8   Period Weeks   Status New   Target Date 06/10/17     PT LONG TERM GOAL #2   Title decrease pain 50% with normal daily activities   Time 8   Period Weeks   Status New   Target Date 06/10/17     PT LONG TERM GOAL #3   Title increase ROM of the cervical spine by 20 degrees in rotation bilaterally with no lasting pain increase.    Time 8   Period Weeks   Status New   Target Date 06/10/17     PT LONG TERM GOAL #4   Title Pt will increase UE strength to 4/5 bilaterally for improved ability to lift, carry light items safely.    Time 8    Period Weeks   Status New   Target Date 06/10/17               Plan - 05/01/17 1737    Clinical Impression Statement Progressed her HEP to stabilization.  decompression exercises on hold due to increasing pain.  Patient had radiating pain int shoulder intermittantly during session however it  was quickly decreased with neck rotation and flexion to opposits side.  Pain increased with neck distraction . Modalitied  helpful.     PT Treatment/Interventions ADLs/Self Care Home Management;Patient/family education;Dry needling;Taping;Cryotherapy;Functional mobility training;Manual techniques;Therapeutic activities;Electrical Stimulation;Therapeutic exercise;Balance training;Moist Heat;Traction;Ultrasound;Neuromuscular re-education;Passive range of motion   PT Next Visit Plan review HEP,  Try UBE or Nustep.  repeat IFC,    PT Home Exercise Plan tension stretching  (decompression, on hold)shoulder and head press.  Cervical stabilizationII.   Consulted and Agree with Plan of Care Patient      Patient will benefit from skilled therapeutic intervention in order to improve the following deficits and impairments:     Visit Diagnosis: Cervicalgia  Muscle weakness (generalized)  Other disturbances of skin sensation     Problem List Patient Active Problem List   Diagnosis Date Noted  . Severe episode of recurrent major depressive disorder, without psychotic features ([place]) 04/25/2017  . Encounter for HCV screening test for low risk patient 03/10/2017  . History of hyperkalemia 02/13/2016  . Skin lesion of back 11/15/2014  . Atypical chest pain 10/21/2014  . Cataract 10/21/2014  . Dyslipidemia, goal to be determined 04/22/2014  . Lipoma 10/07/2012  . Cervical spine arthritis ([place]) 08/27/2012  . History of breast cancer 07/23/2012  . GERD (gastroesophageal reflux disease) 02/03/2012  . COPD GOLD IV 06/24/2011  . Liver cyst 06/24/2011  . Insomnia 06/24/2011    Antawan Mchugh  PTA 05/01/2017, 5:50 PM  [facility] [address] [place], [place], [address] Phone: [phone]   Fax:  [phone]  Name: Mary Jenkins MRN: [id number] Date of Birth: 02-09-1956

## 2017-05-05 ENCOUNTER — Ambulatory Visit
Admission: RE | Admit: 2017-05-05 | Discharge: 2017-05-05 | Disposition: A | Discharge status: Home / Self Care | Payer: Medicare Other | Source: Ambulatory Visit | Attending: Family Medicine | Admitting: Family Medicine

## 2017-05-05 DIAGNOSIS — Z1231 Encounter for screening mammogram for malignant neoplasm of breast: Secondary | ICD-10-CM | POA: Diagnosis not present

## 2017-05-06 ENCOUNTER — Encounter: Payer: Self-pay | Admitting: Physical Therapy

## 2017-05-06 ENCOUNTER — Ambulatory Visit: Payer: Medicare Other | Admitting: Physical Therapy

## 2017-05-06 DIAGNOSIS — M6281 Muscle weakness (generalized): Secondary | ICD-10-CM

## 2017-05-06 DIAGNOSIS — M542 Cervicalgia: Secondary | ICD-10-CM | POA: Diagnosis not present

## 2017-05-06 DIAGNOSIS — R208 Other disturbances of skin sensation: Secondary | ICD-10-CM

## 2017-05-06 NOTE — Therapy (Signed)
[place], [place], [address] Phone: [phone]   Fax:  [phone]  Physical Therapy Treatment  Patient Details  Name: Mary Jenkins MRN: [id number] Date of Birth: 01-10-1956 Referring Provider: Isidore Moos  Encounter Date: 05/06/2017      PT End of Session - 05/06/17 1443    Visit Number 4   Number of Visits 16   PT Start Time 9892   PT Stop Time 1509   PT Time Calculation (min) 54 min   Activity Tolerance Patient tolerated treatment well   Behavior During Therapy [facility] for tasks assessed/performed      Past Medical History:  Diagnosis Date  . Breast cancer ([place])   . Cataract, immature    bilateral  . COPD (chronic obstructive pulmonary disease) ([place])    no home O2  . Cough 02/09/2015  . DDD (degenerative disc disease), cervical   . Exertional shortness of breath    states if she "takes her time" doing activities does not get SOB  . Family history of adverse reaction to anesthesia    pt's sister has hx. of post-op N/V  . Full dentures   . GERD (gastroesophageal reflux disease) 02/03/2012  . High cholesterol    no current med.  Marland Kitchen History of breast cancer   . Personal history of chemotherapy 1997  . Runny nose 02/09/2015   clear drainage, per pt.    Past Surgical History:  Procedure Laterality Date  . BREAST CAPSULOTOMY WITH IMPLANT EXCHANGE Right 06/21/2013   Procedure: REVISION RIGHT BREAST RECONSTRUCTION/REMOVAL OF RIGHT IMPLANT/RIGHT BREAST CAPSULOTOMY WITH INSERT TISSUE EXPLANDER RIGHT BREAST/POSSIBLE LEFT BREAST MASTOPEXY;  Surgeon: Cristine Polio, MD;  Location: [place];  Service: Plastics;  Laterality: Right;  . BREAST RECONSTRUCTION Right 06/21/2013   Procedure: BREAST RECONSTRUCTION;  Surgeon: Cristine Polio, MD;  Location: [place];  Service: Plastics;  Laterality: Right;  . BREAST RECONSTRUCTION Left 11/29/2013   Procedure: LEFT MASTOPEXY  FOR RECONSTRUCTION;  Surgeon: Cristine Polio, MD;  Location: [place];  Service: Plastics;  Laterality: Left;  . BREAST RECONSTRUCTION Right 03/14/2014   Procedure: RECONSTRUCTION NIPPLE RIGHT BREAST;  Surgeon: Cristine Polio, MD;  Location: [place];  Service: Plastics;  Laterality: Right;  . BREAST SURGERY  97   implant  . COLONOSCOPY    . MASTECTOMY, PARTIAL Right 1997   -node dissection  . REDUCTION MAMMAPLASTY Left   . REMOVAL OF TISSUE EXPANDER AND PLACEMENT OF IMPLANT Right 02/13/2015   Procedure: REMOVAL OF TISSUE  EXPANDER  PORT RIGHT BREAST;  Surgeon: Cristine Polio, MD;  Location: [place];  Service: Plastics;  Laterality: Right;  . SCAR REVISION Right 02/13/2015   Procedure: SCAR REVISION RIGHT BREAST;  Surgeon: Cristine Polio, MD;  Location: [place];  Service: Plastics;  Laterality: Right;  . TISSUE EXPANDER PLACEMENT Right 03/14/2014   Procedure: SALINE REMOVAL RIGHT TISSUE EXPANDER;  Surgeon: Cristine Polio, MD;  Location: [place];  Service: Plastics;  Laterality: Right;  . TUBAL LIGATION  1980's    There were no vitals filed for this visit.      Subjective Assessment - 05/06/17 1425    Subjective Doing better.  HAs 3 lb wgts has been doing her exercises.  Pain clinic Monday.    Currently in Pain? No/denies             [facility] Adult PT Treatment/Exercise - 05/06/17 0001  Neck Exercises: Seated   Cervical Rotation 10 reps   Shoulder Shrugs 10 reps     Neck Exercises: Supine   Cervical Isometrics Extension;5 secs   Cervical Isometrics Limitations head press into ball    Neck Retraction 10 reps   Capital Flexion 10 reps   Cervical Rotation Both;5 reps   Other Supine Exercise Cervical stabilization exercises, 1 LB weights : circles,  flexion /extension, horizontal abduction and  adduction 5 X each,  added to HEP     Shoulder Exercises: Supine   Horizontal ABduction  Strengthening;Both;10 reps   Theraband Level (Shoulder Horizontal ABduction) Level 2 (Red)   External Rotation Strengthening;Both;10 reps   Theraband Level (Shoulder External Rotation) Level 2 (Red)   Flexion Strengthening;Both;10 reps   Theraband Level (Shoulder Flexion) Level 2 (Red)   Other Supine Exercises scapular stabilization      Shoulder Exercises: ROM/Strengthening   UBE (Upper Arm Bike) 6 min level 1 4 min forward and 2 min back      Moist Heat Therapy   Number Minutes Moist Heat 15 Minutes   Moist Heat Location Cervical     Electrical Stimulation   Electrical Stimulation Location Neck posterior   Electrical Stimulation Action IFC    Electrical Stimulation Goals Pain                PT Education - 05/06/17 1442    Education provided Yes   Education Details stabilization   Person(s) Educated Patient   Methods Explanation   Comprehension Verbalized understanding          PT Short Term Goals - 05/06/17 1459      PT SHORT TERM GOAL #1   Title independent with initial HEP   Status Achieved     PT SHORT TERM GOAL #2   Title Pt will be able to report improved ability to do light housework due to improvement in pain, muscular endurance   Status On-going           PT Long Term Goals - 04/15/17 2244      PT LONG TERM GOAL #1   Title understand proper posture and body mechanics as it pertains to prevention of further neck pain.    Time 8   Period Weeks   Status New   Target Date 06/10/17     PT LONG TERM GOAL #2   Title decrease pain 50% with normal daily activities   Time 8   Period Weeks   Status New   Target Date 06/10/17     PT LONG TERM GOAL #3   Title increase ROM of the cervical spine by 20 degrees in rotation bilaterally with no lasting pain increase.    Time 8   Period Weeks   Status New   Target Date 06/10/17     PT LONG TERM GOAL #4   Title Pt will increase UE strength to 4/5 bilaterally for improved ability to lift, carry  light items safely.    Time 8   Period Weeks   Status New   Target Date 06/10/17               Plan - 05/06/17 1443    Clinical Impression Statement Patient without pain today. able to do many mat level exercises without increasing pain or numbness. Some difficulty realxing today, COPD flared up,tension in upper traps.  Requested repeat of IFC.    PT Next Visit Plan review HEP,  Try UBE or Nustep.  repeat  IFC,    PT Home Exercise Plan tension stretching  (decompression, on hold)shoulder and head press.  Cervical stabilizationII.   Consulted and Agree with Plan of Care Patient      Patient will benefit from skilled therapeutic intervention in order to improve the following deficits and impairments:  Cardiopulmonary status limiting activity, Decreased activity tolerance, Decreased balance, Decreased range of motion, Impaired flexibility, Postural dysfunction, Improper body mechanics, Impaired perceived functional ability, Decreased mobility, Decreased strength, Increased fascial restricitons, Impaired UE functional use, Pain, Hypomobility, Decreased endurance  Visit Diagnosis: Cervicalgia  Muscle weakness (generalized)  Other disturbances of skin sensation     Problem List Patient Active Problem List   Diagnosis Date Noted  . Severe episode of recurrent major depressive disorder, without psychotic features ([place]) 04/25/2017  . Encounter for HCV screening test for low risk patient 03/10/2017  . History of hyperkalemia 02/13/2016  . Skin lesion of back 11/15/2014  . Atypical chest pain 10/21/2014  . Cataract 10/21/2014  . Dyslipidemia, goal to be determined 04/22/2014  . Lipoma 10/07/2012  . Cervical spine arthritis ([place]) 08/27/2012  . History of breast cancer 07/23/2012  . GERD (gastroesophageal reflux disease) 02/03/2012  . COPD GOLD IV 06/24/2011  . Liver cyst 06/24/2011  . Insomnia 06/24/2011    Mary Jenkins 05/06/2017, 3:02 PM  [facility] [address] [place], [place], [address] Phone: [phone]   Fax:  [phone]  Name: Mary Jenkins MRN: [id number] Date of Birth: [birth year]  Raeford Razor, PT 05/06/17 3:02 PM Phone: [phone] Fax: [phone]

## 2017-05-08 ENCOUNTER — Encounter: Payer: Self-pay | Admitting: Physical Therapy

## 2017-05-08 ENCOUNTER — Ambulatory Visit: Payer: Medicare Other | Admitting: Physical Therapy

## 2017-05-08 DIAGNOSIS — M6281 Muscle weakness (generalized): Secondary | ICD-10-CM

## 2017-05-08 DIAGNOSIS — M542 Cervicalgia: Secondary | ICD-10-CM

## 2017-05-08 DIAGNOSIS — R208 Other disturbances of skin sensation: Secondary | ICD-10-CM

## 2017-05-08 NOTE — Therapy (Signed)
[place], [place], [address] Phone: [phone]   Fax:  [phone]  Physical Therapy Treatment  Patient Details  Name: Mary Jenkins MRN: [id number] Date of Birth: Sep 07, 1956 Referring Provider: Isidore Moos  Encounter Date: 05/08/2017      PT End of Session - 05/08/17 1402    Visit Number 5   Number of Visits 16   Date for PT Re-Evaluation --  ?   PT Start Time 1017   PT Stop Time 1100   PT Time Calculation (min) 43 min   Activity Tolerance Patient tolerated treatment well   Behavior During Therapy [facility] for tasks assessed/performed      Past Medical History:  Diagnosis Date  . Breast cancer ([place])   . Cataract, immature    bilateral  . COPD (chronic obstructive pulmonary disease) ([place])    no home O2  . Cough 02/09/2015  . DDD (degenerative disc disease), cervical   . Exertional shortness of breath    states if she "takes her time" doing activities does not get SOB  . Family history of adverse reaction to anesthesia    pt's sister has hx. of post-op N/V  . Full dentures   . GERD (gastroesophageal reflux disease) 02/03/2012  . High cholesterol    no current med.  Marland Kitchen History of breast cancer   . Personal history of chemotherapy 1997  . Runny nose 02/09/2015   clear drainage, per pt.    Past Surgical History:  Procedure Laterality Date  . BREAST CAPSULOTOMY WITH IMPLANT EXCHANGE Right 06/21/2013   Procedure: REVISION RIGHT BREAST RECONSTRUCTION/REMOVAL OF RIGHT IMPLANT/RIGHT BREAST CAPSULOTOMY WITH INSERT TISSUE EXPLANDER RIGHT BREAST/POSSIBLE LEFT BREAST MASTOPEXY;  Surgeon: Cristine Polio, MD;  Location: [place];  Service: Plastics;  Laterality: Right;  . BREAST RECONSTRUCTION Right 06/21/2013   Procedure: BREAST RECONSTRUCTION;  Surgeon: Cristine Polio, MD;  Location: [place];  Service: Plastics;  Laterality: Right;  . BREAST RECONSTRUCTION Left  11/29/2013   Procedure: LEFT MASTOPEXY FOR RECONSTRUCTION;  Surgeon: Cristine Polio, MD;  Location: [place];  Service: Plastics;  Laterality: Left;  . BREAST RECONSTRUCTION Right 03/14/2014   Procedure: RECONSTRUCTION NIPPLE RIGHT BREAST;  Surgeon: Cristine Polio, MD;  Location: [place];  Service: Plastics;  Laterality: Right;  . BREAST SURGERY  97   implant  . COLONOSCOPY    . MASTECTOMY, PARTIAL Right 1997   -node dissection  . REDUCTION MAMMAPLASTY Left   . REMOVAL OF TISSUE EXPANDER AND PLACEMENT OF IMPLANT Right 02/13/2015   Procedure: REMOVAL OF TISSUE  EXPANDER  PORT RIGHT BREAST;  Surgeon: Cristine Polio, MD;  Location: [place];  Service: Plastics;  Laterality: Right;  . SCAR REVISION Right 02/13/2015   Procedure: SCAR REVISION RIGHT BREAST;  Surgeon: Cristine Polio, MD;  Location: [place];  Service: Plastics;  Laterality: Right;  . TISSUE EXPANDER PLACEMENT Right 03/14/2014   Procedure: SALINE REMOVAL RIGHT TISSUE EXPANDER;  Surgeon: Cristine Polio, MD;  Location: [place];  Service: Plastics;  Laterality: Right;  . TUBAL LIGATION  1980's    There were no vitals filed for this visit.      Subjective Assessment - 05/08/17 1026    Subjective No pain now.  I have been having any pain over the last few days. I have been doing the exercises to keep it at [place].  right  thumb itches.   Currently in  Pain? No/denies   Pain Location Neck   Pain Relieving Factors exercises                         [facility] Adult PT Treatment/Exercise - 05/08/17 0001      Neck Exercises: Seated   Neck Retraction 5 reps;5 secs   Cervical Rotation Limitations --   Other Seated Exercise Whole spine rotation, YOGA, Stretch startin at hips to neck 3 X each direction   Other Seated Exercise Grip average LT 41.6,  Right 37.3  Left:  39, 44, 42,  RT:  42, 35, 35  LBS     Neck Exercises: Supine   Other  Supine Exercise Shoulddr extension with green band 2 X 10  PTA holding   Other Supine Exercise Cervical stabilization exercises,  0 weights : circles,  flexion /extension, horizontal abduction and  adduction 5 X each,  added to HEP     Shoulder Exercises: Supine   Other Supine Exercises Head press with shoulder circles,  horizontal add/abd, flexion extension,  0, LBS, 10 X each,  also  Shoulder extension 10 X supine with green band.  PTA holding.     Shoulder Exercises: Standing   Row 10 reps     Shoulder Exercises: ROM/Strengthening   UBE (Upper Arm Bike) Nustep L4 X 6 minutes arms/legs     Shoulder Exercises: Stretch   Other Shoulder Stretches rach overhead side bend 3 x each way     Manual Therapy   Manual therapy comments Tape, kinesiotex for breathing assist so exercises become easier to do.                   PT Short Term Goals - 05/06/17 1459      PT SHORT TERM GOAL #1   Title independent with initial HEP   Status Achieved     PT SHORT TERM GOAL #2   Title Pt will be able to report improved ability to do light housework due to improvement in pain, muscular endurance   Status On-going           PT Long Term Goals - 04/15/17 2244      PT LONG TERM GOAL #1   Title understand proper posture and body mechanics as it pertains to prevention of further neck pain.    Time 8   Period Weeks   Status New   Target Date 06/10/17     PT LONG TERM GOAL #2   Title decrease pain 50% with normal daily activities   Time 8   Period Weeks   Status New   Target Date 06/10/17     PT LONG TERM GOAL #3   Title increase ROM of the cervical spine by 20 degrees in rotation bilaterally with no lasting pain increase.    Time 8   Period Weeks   Status New   Target Date 06/10/17     PT LONG TERM GOAL #4   Title Pt will increase UE strength to 4/5 bilaterally for improved ability to lift, carry light items safely.    Time 8   Period Weeks   Status New   Target Date  06/10/17               Plan - 05/08/17 1403    Clinical Impression Statement NO pain today.  Focus on stabilization/strengthening.  Kinesiotex tape to assist breathing([place]) No need for modalities.     PT Treatment/Interventions ADLs/Self  Care Home Management;Patient/family education;Dry needling;Taping;Cryotherapy;Functional mobility training;Manual techniques;Therapeutic activities;Electrical Stimulation;Therapeutic exercise;Balance training;Moist Heat;Traction;Ultrasound;Neuromuscular re-education;Passive range of motion   PT Next Visit Plan review HEP,  Try UBE or Nustep. Low resistance.   repeat IFC, Progress HEP as able.   PT Home Exercise Plan tension stretching  (decompression, on hold)shoulder and head press.  Cervical stabilizationII.   Consulted and Agree with Plan of Care Patient      Patient will benefit from skilled therapeutic intervention in order to improve the following deficits and impairments:     Visit Diagnosis: Cervicalgia  Muscle weakness (generalized)  Other disturbances of skin sensation     Problem List Patient Active Problem List   Diagnosis Date Noted  . Severe episode of recurrent major depressive disorder, without psychotic features ([place]) 04/25/2017  . Encounter for HCV screening test for low risk patient 03/10/2017  . History of hyperkalemia 02/13/2016  . Skin lesion of back 11/15/2014  . Atypical chest pain 10/21/2014  . Cataract 10/21/2014  . Dyslipidemia, goal to be determined 04/22/2014  . Lipoma 10/07/2012  . Cervical spine arthritis ([place]) 08/27/2012  . History of breast cancer 07/23/2012  . GERD (gastroesophageal reflux disease) 02/03/2012  . COPD GOLD IV 06/24/2011  . Liver cyst 06/24/2011  . Insomnia 06/24/2011    HARRIS,KAREN PTA 05/08/2017, 2:06 PM  [facility] [address] [place], [place], [address] Phone: [phone]   Fax:  [phone]  Name: TAMORAH HADA MRN: [id number] Date of Birth: Feb 16, 1956

## 2017-05-13 ENCOUNTER — Encounter: Payer: Self-pay | Admitting: Physical Therapy

## 2017-05-13 ENCOUNTER — Ambulatory Visit: Payer: Medicare Other | Admitting: Physical Therapy

## 2017-05-13 DIAGNOSIS — R208 Other disturbances of skin sensation: Secondary | ICD-10-CM | POA: Diagnosis not present

## 2017-05-13 DIAGNOSIS — M542 Cervicalgia: Secondary | ICD-10-CM

## 2017-05-13 DIAGNOSIS — M6281 Muscle weakness (generalized): Secondary | ICD-10-CM

## 2017-05-13 NOTE — Therapy (Signed)
[place], [place], [address] Phone: [phone]   Fax:  [phone]  Physical Therapy Treatment/ Discharge  Patient Details  Name: Mary Jenkins MRN: [id number] Date of Birth: 1956/09/12 Referring Provider: Isidore Moos  Encounter Date: 05/13/2017      PT End of Session - 05/13/17 1447    Visit Number 6   Number of Visits 16   PT Start Time 3419  Charge will not equal time slot.  Patient happy and wanted discharge,  she declined exercise.   PT Stop Time 1438   PT Time Calculation (min) 23 min   Activity Tolerance Patient tolerated treatment well;No increased pain   Behavior During Therapy WFL for tasks assessed/performed      Past Medical History:  Diagnosis Date  . Breast cancer ([place])   . Cataract, immature    bilateral  . COPD (chronic obstructive pulmonary disease) ([place])    no home O2  . Cough 02/09/2015  . DDD (degenerative disc disease), cervical   . Exertional shortness of breath    states if she "takes her time" doing activities does not get SOB  . Family history of adverse reaction to anesthesia    pt's sister has hx. of post-op N/V  . Full dentures   . GERD (gastroesophageal reflux disease) 02/03/2012  . High cholesterol    no current med.  Marland Kitchen History of breast cancer   . Personal history of chemotherapy 1997  . Runny nose 02/09/2015   clear drainage, per pt.    Past Surgical History:  Procedure Laterality Date  . BREAST CAPSULOTOMY WITH IMPLANT EXCHANGE Right 06/21/2013   Procedure: REVISION RIGHT BREAST RECONSTRUCTION/REMOVAL OF RIGHT IMPLANT/RIGHT BREAST CAPSULOTOMY WITH INSERT TISSUE EXPLANDER RIGHT BREAST/POSSIBLE LEFT BREAST MASTOPEXY;  Surgeon: Cristine Polio, MD;  Location: [place];  Service: Plastics;  Laterality: Right;  . BREAST RECONSTRUCTION Right 06/21/2013   Procedure: BREAST RECONSTRUCTION;  Surgeon: Cristine Polio, MD;  Location: [place];  Service: Plastics;  Laterality: Right;  . BREAST RECONSTRUCTION Left 11/29/2013   Procedure: LEFT MASTOPEXY FOR RECONSTRUCTION;  Surgeon: Cristine Polio, MD;  Location: [place];  Service: Plastics;  Laterality: Left;  . BREAST RECONSTRUCTION Right 03/14/2014   Procedure: RECONSTRUCTION NIPPLE RIGHT BREAST;  Surgeon: Cristine Polio, MD;  Location: [place];  Service: Plastics;  Laterality: Right;  . BREAST SURGERY  97   implant  . COLONOSCOPY    . MASTECTOMY, PARTIAL Right 1997   -node dissection  . REDUCTION MAMMAPLASTY Left   . REMOVAL OF TISSUE EXPANDER AND PLACEMENT OF IMPLANT Right 02/13/2015   Procedure: REMOVAL OF TISSUE  EXPANDER  PORT RIGHT BREAST;  Surgeon: Cristine Polio, MD;  Location: [place];  Service: Plastics;  Laterality: Right;  . SCAR REVISION Right 02/13/2015   Procedure: SCAR REVISION RIGHT BREAST;  Surgeon: Cristine Polio, MD;  Location: [place];  Service: Plastics;  Laterality: Right;  . TISSUE EXPANDER PLACEMENT Right 03/14/2014   Procedure: SALINE REMOVAL RIGHT TISSUE EXPANDER;  Surgeon: Cristine Polio, MD;  Location: [place];  Service: Plastics;  Laterality: Right;  . TUBAL LIGATION  1980's    There were no vitals filed for this visit.      Subjective Assessment - 05/13/17 1441    Subjective I don't have pain.  i havr donr the exercises .  Iknw and use good posture with my ADL's.  I think i  don' need PT anymore.  I can do the exercises all at home.   No arm pain.   Currently in Pain? No/denies   Pain Location Neck   Aggravating Factors  N/A   Pain Relieving Factors exercises            [facility] PT Assessment - 05/13/17 0001      Observation/Other Assessments   Focus on Therapeutic Outcomes (FOTO)  47% limitation     AROM   Cervical - Right Rotation 68   Cervical - Left Rotation 65     Strength   Right Shoulder Flexion 4/5   Right Shoulder  ABduction 4/5   Left Shoulder Flexion 4/5   Left Shoulder ABduction 4/5   Right Elbow Flexion 5/5   Right Elbow Extension 4+/5   Left Elbow Flexion 4+/5   Left Elbow Extension 4+/5                     [facility] Adult PT Treatment/Exercise - 05/13/17 0001      Self-Care   Lifting demonstrated/ reviewed golfer's lift.  patient squats at home to get things off floor, clothes from the dryer etc.                PT Education - 05/13/17 1447    Education provided Yes   Education Details review of lifting techniques   Person(s) Educated Patient   Methods Explanation;Demonstration;Verbal cues   Comprehension Verbalized understanding          PT Short Term Goals - 05/13/17 1451      PT SHORT TERM GOAL #1   Title independent with initial HEP   Baseline independent   Time 4   Period Weeks   Status Achieved     PT SHORT TERM GOAL #2   Title Pt will be able to report improved ability to do light housework due to improvement in pain, muscular endurance   Baseline able.  Limitations due to her breathing problems.   Time 4   Period Weeks   Status Achieved           PT Long Term Goals - 05/13/17 1452      PT LONG TERM GOAL #1   Title understand proper posture and body mechanics as it pertains to prevention of further neck pain.    Baseline yes   Time 8   Period Weeks   Status Achieved     PT LONG TERM GOAL #2   Title decrease pain 50% with normal daily activities   Baseline Pain decreased from 10/10 to 0/10.  ( she does get help with groceries etc due to breathing. )   Time 8   Period Weeks   Status Achieved     PT LONG TERM GOAL #3   Title increase ROM of the cervical spine by 20 degrees in rotation bilaterally with no lasting pain increase.    Baseline 58degrees right and left: 60  Pain free.   Time 8   Period Weeks   Status Partially Met     PT LONG TERM GOAL #4   Title Pt will increase UE strength to 4/5 bilaterally for improved ability to  lift, carry light items safely.    Baseline 4 to 4+/5   Time 8   Period Weeks   Status Achieved               Plan - 05/13/17 1448    Clinical Impression Statement All goals met.Except  Partially met LTG# 3 for rotation cervical spine.   FOTO improved from 67% limitation to 47% limitation.  UE strength improved to 4 to 4+/5.  both.  No pain.  She is improved and requested Discharge to HEP.   PT Next Visit Plan D/C due to patient's request   PT Home Exercise Plan tension stretching  (decompression, on hold)shoulder and head press.  Cervical stabilizationII.   Consulted and Agree with Plan of Care Patient      Patient will benefit from skilled therapeutic intervention in order to improve the following deficits and impairments:     Visit Diagnosis: Cervicalgia  Muscle weakness (generalized)  Other disturbances of skin sensation       G-Codes - 04-Jun-2017 1541    Functional Assessment Tool Used (Outpatient Only) FOTO   Functional Limitation Carrying, moving and handling objects   Carrying, Moving and Handling Objects Current Status (R3085) At least 40 percent but less than 60 percent impaired, limited or restricted   Carrying, Moving and Handling Objects Goal Status (U9437) At least 40 percent but less than 60 percent impaired, limited or restricted   Carrying, Moving and Handling Objects Discharge Status [phone]) At least 40 percent but less than 60 percent impaired, limited or restricted      Problem List Patient Active Problem List   Diagnosis Date Noted  . Severe episode of recurrent major depressive disorder, without psychotic features ([place]) 04/25/2017  . Encounter for HCV screening test for low risk patient 03/10/2017  . History of hyperkalemia 02/13/2016  . Skin lesion of back 11/15/2014  . Atypical chest pain 10/21/2014  . Cataract 10/21/2014  . Dyslipidemia, goal to be determined 04/22/2014  . Lipoma 10/07/2012  . Cervical spine arthritis ([place]) 08/27/2012  .  History of breast cancer 07/23/2012  . GERD (gastroesophageal reflux disease) 02/03/2012  . COPD GOLD IV 06/24/2011  . Liver cyst 06/24/2011  . Insomnia 06/24/2011    Meda Dudzinski 06-04-2017, 3:42 PM  [facility] [address] [place], [place], [address] Phone: [phone]   Fax:  [phone]  Name: Mary Jenkins MRN: [id number] Date of Birth: 06-09-1956  PHYSICAL THERAPY DISCHARGE SUMMARY  Visits from Start of Care: 6  Current functional level related to goals / functional outcomes: See above   Remaining deficits: None limiting function, mild weakness, posture, endurance and sensory sx resolved.    Education / Equipment: Posture, HEP  Plan: Patient agrees to discharge.  Patient goals were met. Patient is being discharged due to being pleased with the current functional level.  ?????     Raeford Razor, PT 06/04/2017 3:45 PM Phone: [phone] Fax: [phone]

## 2017-05-13 NOTE — Patient Instructions (Signed)
Continue HEP Continue good posture and body mechanics.

## 2017-05-15 ENCOUNTER — Encounter: Payer: Medicaid Other | Admitting: Physical Therapy

## 2017-05-20 ENCOUNTER — Encounter: Payer: Medicaid Other | Admitting: Physical Therapy

## 2017-05-21 DIAGNOSIS — C50911 Malignant neoplasm of unspecified site of right female breast: Secondary | ICD-10-CM | POA: Diagnosis not present

## 2017-05-21 DIAGNOSIS — M79629 Pain in unspecified upper arm: Secondary | ICD-10-CM | POA: Diagnosis not present

## 2017-05-21 DIAGNOSIS — G894 Chronic pain syndrome: Secondary | ICD-10-CM | POA: Diagnosis not present

## 2017-05-21 DIAGNOSIS — M542 Cervicalgia: Secondary | ICD-10-CM | POA: Diagnosis not present

## 2017-05-21 DIAGNOSIS — M545 Low back pain: Secondary | ICD-10-CM | POA: Diagnosis not present

## 2017-05-21 DIAGNOSIS — M5412 Radiculopathy, cervical region: Secondary | ICD-10-CM | POA: Diagnosis not present

## 2017-05-22 ENCOUNTER — Other Ambulatory Visit: Payer: Self-pay | Admitting: *Deleted

## 2017-05-22 ENCOUNTER — Encounter: Payer: Medicaid Other | Admitting: Physical Therapy

## 2017-05-22 DIAGNOSIS — J449 Chronic obstructive pulmonary disease, unspecified: Secondary | ICD-10-CM

## 2017-05-22 MED ORDER — ALBUTEROL SULFATE HFA 108 (90 BASE) MCG/ACT IN AERS: 2.0000 | INHALATION_SPRAY | Freq: Four times a day (QID) | RESPIRATORY_TRACT | 0 refills | Status: DC | PRN

## 2017-05-23 ENCOUNTER — Ambulatory Visit: Payer: Medicaid Other

## 2017-05-23 ENCOUNTER — Other Ambulatory Visit: Payer: Self-pay | Admitting: *Deleted

## 2017-05-27 ENCOUNTER — Ambulatory Visit: Payer: Medicare Other

## 2017-06-05 DIAGNOSIS — C50911 Malignant neoplasm of unspecified site of right female breast: Secondary | ICD-10-CM | POA: Diagnosis not present

## 2017-06-11 DIAGNOSIS — M542 Cervicalgia: Secondary | ICD-10-CM | POA: Diagnosis not present

## 2017-06-12 ENCOUNTER — Telehealth: Payer: Self-pay | Admitting: Internal Medicine

## 2017-06-12 NOTE — Telephone Encounter (Signed)
Spoke with the pt  She is c/o increased SOB for the past few days, albuterol and neb not giving much relief  OV at 9:15 am tomorrow and I advised her to bring all meds in hand

## 2017-06-13 ENCOUNTER — Ambulatory Visit: Payer: Medicare Other | Admitting: Internal Medicine

## 2017-06-16 ENCOUNTER — Encounter: Payer: Self-pay | Admitting: Internal Medicine

## 2017-06-16 ENCOUNTER — Ambulatory Visit (INDEPENDENT_AMBULATORY_CARE_PROVIDER_SITE_OTHER)
Admission: RE | Admit: 2017-06-16 | Discharge: 2017-06-16 | Disposition: A | Discharge status: Home / Self Care | Payer: Medicare Other | Source: Ambulatory Visit | Attending: Internal Medicine | Admitting: Internal Medicine

## 2017-06-16 ENCOUNTER — Ambulatory Visit (INDEPENDENT_AMBULATORY_CARE_PROVIDER_SITE_OTHER): Payer: Medicare Other | Admitting: Internal Medicine

## 2017-06-16 VITALS — BP 104/70 | HR 60 | Ht 66.0 in | Wt 102.0 lb

## 2017-06-16 DIAGNOSIS — J441 Chronic obstructive pulmonary disease with (acute) exacerbation: Secondary | ICD-10-CM

## 2017-06-16 DIAGNOSIS — Z23 Encounter for immunization: Secondary | ICD-10-CM

## 2017-06-16 DIAGNOSIS — J449 Chronic obstructive pulmonary disease, unspecified: Secondary | ICD-10-CM | POA: Diagnosis not present

## 2017-06-16 MED ORDER — PREDNISONE 10 MG PO TABS: ORAL_TABLET | ORAL | 0 refills | Status: DC

## 2017-06-16 NOTE — Patient Instructions (Addendum)
When start cough >  mucinex dm 1200 mg every 12 hours as needed and Try prilosec (omeprazole) otc 31m  Take 30-60 min before first meal of the day and Pepcid ac (famotidine) 20 mg one @  bedtime until cough is completely gone for at least a week without the need for cough suppression  Flu shot today   Prednisone 10 mg take  4 each am x 2 days,   2 each am x 2 days,  1 each am x 2 days and stop   Please remember to go to the  x-ray department downstairs in the basement  for your tests - we will call you with the results when they are available.      If not improved return to see Tammy NP with all medications/inhalers/ solutions

## 2017-06-16 NOTE — Assessment & Plan Note (Addendum)
PFT performed on 06/27/2011. Poor quality.    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 gerd related ? uacs (now off gabapentin so may be developing cycical/uacs >>  therefore > rec mucinex dm/ max gerd rx and Prednisone 10 mg take  4 each am x 2 days,   2 each am x 2 days,  1 each am x 2 days and stop   If not better return as prev requested with all meds in hand using a trust but verify approach to confirm accurate Medication  Reconciliation The principal here is that until we are certain that the  patients are doing what we've asked, it makes no sense to ask them to do more.

## 2017-06-16 NOTE — Progress Notes (Signed)
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 No real variability, cough is better with no excess or purulent sputum.  Not using saba daytime because finds if she's holding still doesn't need it as oftern. Some better p last  Prednisone rx. Not much variability. rec Stop atrovent and advair Start symbiocort Take 2 puffs first thing in am and then another 2 puffs about 12 hours later.  Take after only am dose Spiriva Protonix 40 mg Take 30-60 min before first meal of the day  GERD diet Only use your albuterol (Plan B= ventolin puffer,  Plan C is nebulizer) as a rescue medication to be used if you can't catch your breath by resting or doing a relaxed purse lip breathing pattern. The less you use it, the better it will work when you need it. Ok to use rescue up to every 4 hours if doing poorly.      11/21/2016  f/u ov/Wert re: GOLD IV copd / symb/ spiriva / no 02 Chief Complaint  Patient presents with  . Follow-up    follows for COPD, pt resports she is still SOB with exertion, coughing with clear/yellow mucus   still congested cough but overall improved  Doe still = MMRC3 / rarely using saba and never needs noct  rec Plan A = Automatic = Symbicort/ spiriva daily  Plan B = Backup Only use your albuterol as a rescue medication  Plan C = Crisis - only use your albuterol nebulizer if you first try Plan B  mucinex dm up to 1200 mg every 12 hours as needed for cough     02/21/2017  f/u ov/Wert re: GOLD IV  Copd/  sym/ spiriva maint rx  Chief Complaint  Patient presents with  . Follow-up    Breathing is overall doing well. She is using her albuterol inhaler 2-3 x per day on average and neb with albuterol maybe once per wk.    MMRC2 = can't walk a nl pace on a flat grade s sob but does fine slow and flat pushes the cart most times at [employer] and even sometimes at [employer]  rec No change in medications - stay as active as you can but pace yourself Plan A = Automatic = Symbicort/ spiriva daily  Plan B = Backup Only use your albuterol as a rescue medication  Plan C = Crisis - only use your albuterol nebulizer if you first try Plan B  mucinex dm up to 1200 mg every 12 hours as needed for cough  Bring meds to next ov    06/16/2017  Acute  ov/Wert re:  GOLD IV on symb/spiriva / no meds in hand as req  Chief Complaint  Patient presents with  . Acute Visit    Increased SOB and cough for the past wk- non prod cough. She has been using her albuterol inhaler 4 x daily on average and neb once in the past wk.   prior to  onset of worsening was using saba 1-2 x per day, never while sleeping Since acutely worse sob x one week  more coughing but non prod and even using the saba at noct as well as 4 x daily, partial relief   No obvious day to day or daytime variability or assoc excess/ purulent sputum or mucus plugs or hemoptysis or cp or chest tightness, subjective wheeze or overt sinus or hb symptoms. No unusual exp hx or h/o childhood pna/ asthma or knowledge of premature birth.   . Also denies any obvious fluctuation of symptoms with weather or environmental changes or other aggravating or alleviating factors except as outlined above   Current Allergies, Complete Past Medical History, Past Surgical History, Family History, and Social History were reviewed in [employer] record.  ROS  The following are not active complaints unless bolded sore throat, dysphagia, dental problems, itching,  sneezing,  nasal congestion or disharge of excess mucus or purulent secretions, ear ache,   fever, chills, sweats, unintended wt loss or wt gain, classically pleuritic or exertional cp,  orthopnea pnd or leg swelling, presyncope, palpitations, abdominal pain, anorexia, nausea, vomiting, diarrhea  or change in bowel habits or bladder habits, change in stools or change in urine, dysuria, hematuria,  rash, arthralgias, visual complaints, headache, numbness, weakness or ataxia or problems with walking or coordination,  change in mood/affect or memory.        Current Meds  Medication Sig  . albuterol (PROVENTIL HFA;VENTOLIN HFA) 108 (90 Base) MCG/ACT inhaler Inhale 2 puffs into the lungs every 6 (six) hours as needed for wheezing.  Marland Kitchen albuterol (PROVENTIL) (2.5 MG/3ML) 0.083% nebulizer solution Take 3 mLs (2.5 mg total) by nebulization every 6 (six) hours as needed for wheezing. For wheezing  . aspirin EC 81 MG tablet Take 81 mg by mouth daily.  . budesonide-formoterol (SYMBICORT) 160-4.5 MCG/ACT inhaler Inhale 2 puffs into the lungs 2 (two) times daily.  Marland Kitchen omeprazole (PRILOSEC) 20 MG capsule Take 1 capsule (20 mg total) by mouth daily as needed.  Marland Kitchen oxymetazoline (AFRIN NASAL SPRAY) 0.05 % nasal spray Place 1 spray into both nostrils 2 (two) times daily. Spray once into each nostril twice daily for up to the next 3 days. Do not use for more than 3 days to prevent rebound rhinorrhea.  . RESTASIS 0.05 % ophthalmic emulsion Place into both eyes 2 (two) times daily.  Marland Kitchen SPIRIVA RESPIMAT 2.5 MCG/ACT AERS INHALE TWO PUFFS BY MOUTH ONCE DAILY IN THE MORNING                                 Objective:   Physical Exam  Amb bf nad  / vital signs reviewed   - Note on arrival 02 sats  96% on RA      Wt 120 02/13/2012  > 119 03/13/2012 > 04/22/2012 117 > 07/21/2012 122> 12/02/2012  131> 03/22/2013  131 > 04/19/2013  129  > 07/19/2013 131 > 06/06/2014 129 >  11/11/2014  132> 02/28/2015   129 > 01/16/2016  115 >   02/29/2016  110 > 05/31/2016  115  > 11/21/2016   117 > 02/21/2017   107 > 06/16/2017 102     HEENT: nl   turbinates bilaterally, and oropharynx. Nl external ear canals without cough reflex - edentulous    NECK :  without JVD/Nodes/TM/ nl carotid upstrokes bilaterally   LUNGS: no acc muscle use,  Distant bs bilaterally with prolonged Texp but no audible wheeze   CV:  RRR  no s3 or murmur or increase in P2, and no edema   ABD:  soft and nontender with nl inspiratory excursion  No bruits or organomegaly appreciated, bowel sounds nl  MS:  Nl gait/ ext warm without deformities, calf tenderness, cyanosis or clubbing No obvious joint restrictions   SKIN: warm and dry without lesions    NEURO:  alert, approp, nl sensorium with  no motor or cerebellar deficits apparent.           CXR PA and Lateral:   06/16/2017 :    I personally reviewed images and agree with radiology impression as follows:    COPD. No acute cardiopulmonary disease . Chest is stable from prior exam.         Assessment & Plan:

## 2017-06-17 NOTE — Progress Notes (Signed)
ATC, NA and no VM set up yet

## 2017-06-17 NOTE — Assessment & Plan Note (Deleted)
PFT performed on 06/27/2011. Poor quality.    - FEV1 0.39 ( 16%) ratio 23 and 16% better p B2    - 03/13/2012  Inhaler technique 90% with dpi but only 50% with mdi > 50% 04/22/12   - Alpha one genotype 02/13/12 > MM   - 03/22/2013  PFT's 0.56 ( 24%) ratio 33 but 12% better improvement and DLCO 21 corrects to 28%    started Incruse 11/11/14 p spiriva off formulary> d/c 02/28/2015 due to cough  - changed incruse to spiriva respimat 02/28/2015 due to cough  -Spirometry  05/31/2016  FEV1 0.52 (23%)  Ratio 32   - 05/31/2016  After extensive coaching HFA effectiveness =    90%  - referred to rehab 05/31/2016 >>> did not go    Acute flare without excess/ purulent sputum or mucus plugs  Suggests might be gerd but note also off gabapentin/ lyrica now ? Redeveloping cyclical upper airway cough mimicking copd exac   rec gerd rx for flares/ Prednisone 10 mg take  4 each am x 2 days,   2 each am x 2 days,  1 each am x 2 days and stop / no abx  I had an extended discussion with the patient reviewing all relevant studies completed to date and  lasting 15 to 20 minutes of a 25 minute acute  visit    Each maintenance medication was reviewed in detail including most importantly the difference between maintenance and prns and under what circumstances the prns are to be triggered using an action plan format that is not reflected in the computer generated alphabetically organized AVS.    Please see AVS for specific instructions unique to this visit that I personally wrote and verbalized to the the pt in detail and then reviewed with pt  by my nurse highlighting any  changes in therapy recommended at today's visit to their plan of care.

## 2017-06-20 ENCOUNTER — Encounter (HOSPITAL_BASED_OUTPATIENT_CLINIC_OR_DEPARTMENT_OTHER): Payer: Self-pay | Admitting: *Deleted

## 2017-06-23 ENCOUNTER — Other Ambulatory Visit: Payer: Self-pay | Admitting: Internal Medicine

## 2017-06-23 ENCOUNTER — Ambulatory Visit: Payer: Self-pay | Admitting: Specialist

## 2017-06-23 MED ORDER — OMEPRAZOLE 20 MG PO CPDR: 20.0000 mg | DELAYED_RELEASE_CAPSULE | Freq: Every day | ORAL | 0 refills | Status: DC | PRN

## 2017-06-23 NOTE — Telephone Encounter (Signed)
Pt calling for omeprazole refill... Will forward request to MD.Pleas Mary Cassady10/1/20184:24 PM

## 2017-06-23 NOTE — H&P (Signed)
Mary Jenkins is an 61 y.o. female.   Chief Complaint: rightnbreast cancer reconstruction with tissue expanderHPI: for tissue expander removal and gel implant replacement  Past Medical History:  Diagnosis Date  . Breast cancer ([place])   . Cataract, immature    bilateral  . COPD (chronic obstructive pulmonary disease) ([place])    no home O2  . Cough 02/09/2015  . DDD (degenerative disc disease), cervical   . Exertional shortness of breath    states if she "takes her time" doing activities does not get SOB  . Family history of adverse reaction to anesthesia    pt's sister has hx. of post-op N/V  . Full dentures   . GERD (gastroesophageal reflux disease) 02/03/2012  . High cholesterol    no current med.  Marland Kitchen History of breast cancer   . Personal history of chemotherapy 1997  . Runny nose 02/09/2015   clear drainage, per pt.    Past Surgical History:  Procedure Laterality Date  . BREAST CAPSULOTOMY WITH IMPLANT EXCHANGE Right 06/21/2013   Procedure: REVISION RIGHT BREAST RECONSTRUCTION/REMOVAL OF RIGHT IMPLANT/RIGHT BREAST CAPSULOTOMY WITH INSERT TISSUE EXPLANDER RIGHT BREAST/POSSIBLE LEFT BREAST MASTOPEXY;  Surgeon: Cristine Polio, MD;  Location: [facility];  Service: Plastics;  Laterality: Right;  . BREAST RECONSTRUCTION Right 06/21/2013   Procedure: BREAST RECONSTRUCTION;  Surgeon: Cristine Polio, MD;  Location: [facility];  Service: Plastics;  Laterality: Right;  . BREAST RECONSTRUCTION Left 11/29/2013   Procedure: LEFT MASTOPEXY FOR RECONSTRUCTION;  Surgeon: Cristine Polio, MD;  Location: [place] [facility];  Service: Plastics;  Laterality: Left;  . BREAST RECONSTRUCTION Right 03/14/2014   Procedure: RECONSTRUCTION NIPPLE RIGHT BREAST;  Surgeon: Cristine Polio, MD;  Location: [facility];  Service: Plastics;  Laterality: Right;  . BREAST SURGERY  97   implant  . COLONOSCOPY    . MASTECTOMY, PARTIAL Right 1997   -node dissection  .  REDUCTION MAMMAPLASTY Left   . REMOVAL OF TISSUE EXPANDER AND PLACEMENT OF IMPLANT Right 02/13/2015   Procedure: REMOVAL OF TISSUE  EXPANDER  PORT RIGHT BREAST;  Surgeon: Cristine Polio, MD;  Location: [facility];  Service: Plastics;  Laterality: Right;  . SCAR REVISION Right 02/13/2015   Procedure: SCAR REVISION RIGHT BREAST;  Surgeon: Cristine Polio, MD;  Location: [facility];  Service: Plastics;  Laterality: Right;  . TISSUE EXPANDER PLACEMENT Right 03/14/2014   Procedure: SALINE REMOVAL RIGHT TISSUE EXPANDER;  Surgeon: Cristine Polio, MD;  Location: [facility];  Service: Plastics;  Laterality: Right;  . TUBAL LIGATION  1980's    Family History  Problem Relation Age of Onset  . Hypertension Mother   . Hypertension Sister   . Anesthesia problems Sister        post-op N/V  . Heart disease Brother    Social History:  reports that she quit smoking about 4 years ago. She has never used smokeless tobacco. She reports that she does not drink alcohol or use drugs.  Allergies: No Known Allergies   (Not in a hospital admission)  No results found for this or any previous visit (from the past 48 hour(s)). No results found.  Review of Systems  Constitutional: Negative.   HENT: Negative.   Eyes: Negative.   Respiratory: Negative.   Cardiovascular: Negative.   Gastrointestinal: Negative.   Genitourinary: Negative.   Musculoskeletal: Negative.   Skin: Negative.   Neurological: Negative.   Endo/Heme/Allergies: Negative.   Psychiatric/Behavioral: Negative.  There were no vitals taken for this visit. Physical Exam   Assessment/Plan Right breast cancer for removal of tissue expander and replacement with gel implant  Ritamarie Arkin L, MD 06/23/2017, 10:54 AM

## 2017-06-23 NOTE — Telephone Encounter (Signed)
Patient is requesting a refill on meds, pls call pt back

## 2017-06-23 NOTE — Telephone Encounter (Signed)
Called pt - no answer; VM has not been set-up, unable to leave message.

## 2017-06-27 NOTE — H&P (Signed)
Mary Jenkins is an 61 y.o. female.   Chief Complaint: HX OF RIGHT BREAST CANCER FOR IMPLANT EXCHANGE HPI: has tissue expander present will have removed and replaced with gel implant  Past Medical History:  Diagnosis Date  . Breast cancer ([place])   . Cataract, immature    bilateral  . COPD (chronic obstructive pulmonary disease) ([place])    no home O2  . Cough 02/09/2015  . DDD (degenerative disc disease), cervical   . Exertional shortness of breath    states if she "takes her time" doing activities does not get SOB  . Family history of adverse reaction to anesthesia    pt's sister has hx. of post-op N/V  . Full dentures   . GERD (gastroesophageal reflux disease) 02/03/2012  . High cholesterol    no current med.  Marland Kitchen History of breast cancer   . Personal history of chemotherapy 1997  . Runny nose 02/09/2015   clear drainage, per pt.    Past Surgical History:  Procedure Laterality Date  . BREAST CAPSULOTOMY WITH IMPLANT EXCHANGE Right 06/21/2013   Procedure: REVISION RIGHT BREAST RECONSTRUCTION/REMOVAL OF RIGHT IMPLANT/RIGHT BREAST CAPSULOTOMY WITH INSERT TISSUE EXPLANDER RIGHT BREAST/POSSIBLE LEFT BREAST MASTOPEXY;  Surgeon: Cristine Polio, MD;  Location: [facility];  Service: Plastics;  Laterality: Right;  . BREAST RECONSTRUCTION Right 06/21/2013   Procedure: BREAST RECONSTRUCTION;  Surgeon: Cristine Polio, MD;  Location: [facility];  Service: Plastics;  Laterality: Right;  . BREAST RECONSTRUCTION Left 11/29/2013   Procedure: LEFT MASTOPEXY FOR RECONSTRUCTION;  Surgeon: Cristine Polio, MD;  Location: [facility];  Service: Plastics;  Laterality: Left;  . BREAST RECONSTRUCTION Right 03/14/2014   Procedure: RECONSTRUCTION NIPPLE RIGHT BREAST;  Surgeon: Cristine Polio, MD;  Location: [facility];  Service: Plastics;  Laterality: Right;  . BREAST SURGERY  97   implant  . COLONOSCOPY    . MASTECTOMY, PARTIAL Right 1997   -node  dissection  . REDUCTION MAMMAPLASTY Left   . REMOVAL OF TISSUE EXPANDER AND PLACEMENT OF IMPLANT Right 02/13/2015   Procedure: REMOVAL OF TISSUE  EXPANDER  PORT RIGHT BREAST;  Surgeon: Cristine Polio, MD;  Location: [facility];  Service: Plastics;  Laterality: Right;  . SCAR REVISION Right 02/13/2015   Procedure: SCAR REVISION RIGHT BREAST;  Surgeon: Cristine Polio, MD;  Location: [facility];  Service: Plastics;  Laterality: Right;  . TISSUE EXPANDER PLACEMENT Right 03/14/2014   Procedure: SALINE REMOVAL RIGHT TISSUE EXPANDER;  Surgeon: Cristine Polio, MD;  Location: [facility];  Service: Plastics;  Laterality: Right;  . TUBAL LIGATION  1980's    Family History  Problem Relation Age of Onset  . Hypertension Mother   . Hypertension Sister   . Anesthesia problems Sister        post-op N/V  . Heart disease Brother    Social History:  reports that she quit smoking about 4 years ago. She has never used smokeless tobacco. She reports that she does not drink alcohol or use drugs.  Allergies: No Known Allergies  No prescriptions prior to admission.    No results found for this or any previous visit (from the past 48 hour(s)). No results found.  Review of Systems  Constitutional: Negative.   HENT: Negative.   Eyes: Negative.   Respiratory: Negative.   Cardiovascular: Negative.   Gastrointestinal: Negative.   Genitourinary: Negative.   Musculoskeletal: Negative.   Skin: Negative.   Neurological: Negative.  Endo/Heme/Allergies: Negative.   Psychiatric/Behavioral: Negative.     Height _0  (1.651 m), weight 46.3 kg (102 lb). Physical Exam   Assessment/Plan Hx of right breast cancer for tissue expander removal and gel implant replacement  [name], MD 06/27/2017, 12:06 PM

## 2017-06-29 NOTE — Anesthesia Preprocedure Evaluation (Addendum)
Anesthesia Evaluation  Patient identified by MRN, date of birth, ID band Patient awake    Reviewed: Allergy & Precautions, NPO status , Patient's Chart, lab work & pertinent test results  History of Anesthesia Complications Negative for: history of anesthetic complications  Airway Mallampati: I  TM Distance: >3 FB Neck ROM: Full    Dental  (+) Edentulous Upper, Edentulous Lower   Pulmonary COPD,  COPD inhaler, former smoker,    breath sounds clear to auscultation       Cardiovascular negative cardio ROS   Rhythm:Regular Rate:Normal     Neuro/Psych Depression    GI/Hepatic Neg liver ROS, GERD  Medicated and Controlled,  Endo/Other  negative endocrine ROS  Renal/GU negative Renal ROS     Musculoskeletal  (+) Arthritis ,   Abdominal   Peds  Hematology negative hematology ROS (+)   Anesthesia Other Findings H/o breast cancer: surgery, chemo  Reproductive/Obstetrics                            Anesthesia Physical Anesthesia Plan  ASA: II  Anesthesia Plan: General   Post-op Pain Management:    Induction: Intravenous  PONV Risk Score and Plan: 3 and Ondansetron, Dexamethasone, Midazolam and Scopolamine patch - Pre-op  Airway Management Planned: Oral ETT  Additional Equipment:   Intra-op Plan:   Post-operative Plan: Extubation in OR  Informed Consent: I have reviewed the patients History and Physical, chart, labs and discussed the procedure including the risks, benefits and alternatives for the proposed anesthesia with the patient or authorized representative who has indicated his/her understanding and acceptance.     Plan Discussed with: CRNA and Surgeon  Anesthesia Plan Comments: (Plan routine monitors, GETA)        Anesthesia Quick Evaluation

## 2017-06-30 ENCOUNTER — Encounter (HOSPITAL_BASED_OUTPATIENT_CLINIC_OR_DEPARTMENT_OTHER): Payer: Self-pay | Admitting: Emergency Medicine

## 2017-06-30 ENCOUNTER — Ambulatory Visit (HOSPITAL_BASED_OUTPATIENT_CLINIC_OR_DEPARTMENT_OTHER): Payer: Medicare Other | Admitting: Anesthesiology

## 2017-06-30 ENCOUNTER — Ambulatory Visit (HOSPITAL_BASED_OUTPATIENT_CLINIC_OR_DEPARTMENT_OTHER)
Admission: RE | Admit: 2017-06-30 | Discharge: 2017-06-30 | Disposition: A | Discharge status: Home / Self Care | Payer: Medicare Other | Source: Ambulatory Visit | Attending: Specialist | Admitting: Specialist

## 2017-06-30 ENCOUNTER — Encounter (HOSPITAL_BASED_OUTPATIENT_CLINIC_OR_DEPARTMENT_OTHER)
Admission: RE | Disposition: A | Discharge status: Home / Self Care | Payer: Self-pay | Source: Ambulatory Visit | Attending: Specialist

## 2017-06-30 DIAGNOSIS — Z87891 Personal history of nicotine dependence: Secondary | ICD-10-CM | POA: Diagnosis not present

## 2017-06-30 DIAGNOSIS — Z9221 Personal history of antineoplastic chemotherapy: Secondary | ICD-10-CM | POA: Insufficient documentation

## 2017-06-30 DIAGNOSIS — J449 Chronic obstructive pulmonary disease, unspecified: Secondary | ICD-10-CM | POA: Insufficient documentation

## 2017-06-30 DIAGNOSIS — E785 Hyperlipidemia, unspecified: Secondary | ICD-10-CM | POA: Diagnosis not present

## 2017-06-30 DIAGNOSIS — Z853 Personal history of malignant neoplasm of breast: Secondary | ICD-10-CM | POA: Diagnosis not present

## 2017-06-30 DIAGNOSIS — Z45811 Encounter for adjustment or removal of right breast implant: Secondary | ICD-10-CM | POA: Diagnosis not present

## 2017-06-30 DIAGNOSIS — K219 Gastro-esophageal reflux disease without esophagitis: Secondary | ICD-10-CM | POA: Insufficient documentation

## 2017-06-30 DIAGNOSIS — L905 Scar conditions and fibrosis of skin: Secondary | ICD-10-CM | POA: Diagnosis not present

## 2017-06-30 DIAGNOSIS — Z421 Encounter for breast reconstruction following mastectomy: Secondary | ICD-10-CM | POA: Diagnosis not present

## 2017-06-30 DIAGNOSIS — E7800 Pure hypercholesterolemia, unspecified: Secondary | ICD-10-CM | POA: Insufficient documentation

## 2017-06-30 DIAGNOSIS — Z8249 Family history of ischemic heart disease and other diseases of the circulatory system: Secondary | ICD-10-CM | POA: Insufficient documentation

## 2017-06-30 DIAGNOSIS — Z79899 Other long term (current) drug therapy: Secondary | ICD-10-CM | POA: Diagnosis not present

## 2017-06-30 DIAGNOSIS — Z9011 Acquired absence of right breast and nipple: Secondary | ICD-10-CM | POA: Diagnosis not present

## 2017-06-30 DIAGNOSIS — J441 Chronic obstructive pulmonary disease with (acute) exacerbation: Secondary | ICD-10-CM | POA: Diagnosis not present

## 2017-06-30 DIAGNOSIS — C50111 Malignant neoplasm of central portion of right female breast: Secondary | ICD-10-CM | POA: Diagnosis not present

## 2017-06-30 HISTORY — PX: CAPSULOTOMY: SHX379

## 2017-06-30 HISTORY — PX: REMOVAL OF TISSUE EXPANDER AND PLACEMENT OF IMPLANT: SHX6457

## 2017-06-30 SURGERY — REMOVAL, TISSUE EXPANDER, BREAST, WITH IMPLANT INSERTION
Anesthesia: General | Site: Breast | Laterality: Right

## 2017-06-30 MED ORDER — LIDOCAINE-EPINEPHRINE 0.5 %-1:200000 IJ SOLN
INTRAMUSCULAR | Status: AC
  Filled 2017-06-30: qty 2

## 2017-06-30 MED ORDER — FENTANYL CITRATE (PF) 100 MCG/2ML IJ SOLN
INTRAMUSCULAR | Status: DC | PRN
  Administered 2017-06-30: 50 ug via INTRAVENOUS

## 2017-06-30 MED ORDER — ONDANSETRON HCL 4 MG/2ML IJ SOLN
INTRAMUSCULAR | Status: DC | PRN
  Administered 2017-06-30: 4 mg via INTRAVENOUS

## 2017-06-30 MED ORDER — LIDOCAINE 2% (20 MG/ML) 5 ML SYRINGE
INTRAMUSCULAR | Status: AC
  Filled 2017-06-30: qty 5

## 2017-06-30 MED ORDER — CEFAZOLIN SODIUM-DEXTROSE 2-4 GM/100ML-% IV SOLN
INTRAVENOUS | Status: AC
  Filled 2017-06-30: qty 100

## 2017-06-30 MED ORDER — MIDAZOLAM HCL 2 MG/2ML IJ SOLN
1.0000 mg | INTRAMUSCULAR | Status: DC | PRN
  Administered 2017-06-30: 1 mg via INTRAVENOUS

## 2017-06-30 MED ORDER — MIDAZOLAM HCL 2 MG/2ML IJ SOLN
INTRAMUSCULAR | Status: AC
  Filled 2017-06-30: qty 2

## 2017-06-30 MED ORDER — ONDANSETRON HCL 4 MG/2ML IJ SOLN
INTRAMUSCULAR | Status: AC
  Filled 2017-06-30: qty 2

## 2017-06-30 MED ORDER — SCOPOLAMINE 1 MG/3DAYS TD PT72: 1.0000 | MEDICATED_PATCH | Freq: Once | TRANSDERMAL | Status: DC | PRN

## 2017-06-30 MED ORDER — FENTANYL CITRATE (PF) 100 MCG/2ML IJ SOLN
INTRAMUSCULAR | Status: AC
  Filled 2017-06-30: qty 2

## 2017-06-30 MED ORDER — SCOPOLAMINE 1 MG/3DAYS TD PT72
MEDICATED_PATCH | TRANSDERMAL | Status: AC
  Filled 2017-06-30: qty 1

## 2017-06-30 MED ORDER — CHLORHEXIDINE GLUCONATE CLOTH 2 % EX PADS: 6.0000 | MEDICATED_PAD | Freq: Once | CUTANEOUS | Status: DC

## 2017-06-30 MED ORDER — PROPOFOL 10 MG/ML IV BOLUS
INTRAVENOUS | Status: DC | PRN
  Administered 2017-06-30: 150 mg via INTRAVENOUS

## 2017-06-30 MED ORDER — SCOPOLAMINE 1 MG/3DAYS TD PT72
1.0000 | MEDICATED_PATCH | Freq: Once | TRANSDERMAL | Status: AC
  Administered 2017-06-30: 1.5 mg via TRANSDERMAL
  Administered 2017-06-30: 1 via TRANSDERMAL

## 2017-06-30 MED ORDER — LIDOCAINE 2% (20 MG/ML) 5 ML SYRINGE
INTRAMUSCULAR | Status: DC | PRN
  Administered 2017-06-30: 40 mg via INTRAVENOUS

## 2017-06-30 MED ORDER — LACTATED RINGERS IV SOLN
INTRAVENOUS | Status: DC
  Administered 2017-06-30: 07:00:00 via INTRAVENOUS
  Administered 2017-06-30: 10 mL/h via INTRAVENOUS

## 2017-06-30 MED ORDER — EPHEDRINE 5 MG/ML INJ
INTRAVENOUS | Status: AC
  Filled 2017-06-30: qty 10

## 2017-06-30 MED ORDER — PROPOFOL 10 MG/ML IV BOLUS
INTRAVENOUS | Status: AC
  Filled 2017-06-30: qty 20

## 2017-06-30 MED ORDER — DEXAMETHASONE SODIUM PHOSPHATE 4 MG/ML IJ SOLN
INTRAMUSCULAR | Status: DC | PRN
  Administered 2017-06-30: 10 mg via INTRAVENOUS

## 2017-06-30 MED ORDER — EPHEDRINE SULFATE-NACL 50-0.9 MG/10ML-% IV SOSY
PREFILLED_SYRINGE | INTRAVENOUS | Status: DC | PRN
  Administered 2017-06-30 (×2): 10 mg via INTRAVENOUS

## 2017-06-30 MED ORDER — CEFAZOLIN SODIUM-DEXTROSE 2-4 GM/100ML-% IV SOLN
2.0000 g | INTRAVENOUS | Status: AC
  Administered 2017-06-30: 2 g via INTRAVENOUS

## 2017-06-30 MED ORDER — LIDOCAINE-EPINEPHRINE 0.5 %-1:200000 IJ SOLN
INTRAMUSCULAR | Status: DC | PRN
  Administered 2017-06-30: 10 mL

## 2017-06-30 MED ORDER — FENTANYL CITRATE (PF) 100 MCG/2ML IJ SOLN: 50.0000 ug | INTRAMUSCULAR | Status: DC | PRN

## 2017-06-30 MED ORDER — DEXAMETHASONE SODIUM PHOSPHATE 10 MG/ML IJ SOLN
INTRAMUSCULAR | Status: AC
  Filled 2017-06-30: qty 1

## 2017-06-30 SURGICAL SUPPLY — 78 items
APL SKNCLS STERI-STRIP NONHPOA (GAUZE/BANDAGES/DRESSINGS) ×2
BAG DECANTER FOR FLEXI CONT (MISCELLANEOUS) ×1 IMPLANT
BENZOIN TINCTURE PRP APPL 2/3 (GAUZE/BANDAGES/DRESSINGS) ×5 IMPLANT
BINDER BREAST LRG (GAUZE/BANDAGES/DRESSINGS) IMPLANT
BINDER BREAST MEDIUM (GAUZE/BANDAGES/DRESSINGS) ×3 IMPLANT
BINDER BREAST XLRG (GAUZE/BANDAGES/DRESSINGS) IMPLANT
BINDER BREAST XXLRG (GAUZE/BANDAGES/DRESSINGS) IMPLANT
BLADE KNIFE PERSONA 10 (BLADE) ×4 IMPLANT
BLADE KNIFE PERSONA 15 (BLADE) ×4 IMPLANT
CANISTER SUCT 1200ML W/VALVE (MISCELLANEOUS) ×4 IMPLANT
COVER BACK TABLE 60X90IN (DRAPES) ×4 IMPLANT
COVER MAYO STAND STRL (DRAPES) ×4 IMPLANT
DECANTER SPIKE VIAL GLASS SM (MISCELLANEOUS) ×4 IMPLANT
DRAIN CHANNEL 10M FLAT 3/4 FLT (DRAIN) ×1 IMPLANT
DRAIN CHANNEL 7F 3/4 FLAT (WOUND CARE) IMPLANT
DRAIN PENROSE 1/4X12 LTX STRL (WOUND CARE) IMPLANT
DRAPE LAPAROSCOPIC ABDOMINAL (DRAPES) ×4 IMPLANT
DRSG PAD ABDOMINAL 8X10 ST (GAUZE/BANDAGES/DRESSINGS) ×8 IMPLANT
ELECT BLADE 6.5 .24CM SHAFT (ELECTRODE) ×4 IMPLANT
ELECT REM PT RETURN 9FT ADLT (ELECTROSURGICAL) ×4
ELECTRODE REM PT RTRN 9FT ADLT (ELECTROSURGICAL) ×2 IMPLANT
EVACUATOR SILICONE 100CC (DRAIN) ×1 IMPLANT
FILTER 7/8 IN (FILTER) ×1 IMPLANT
GAUZE SPONGE 4X4 12PLY STRL (GAUZE/BANDAGES/DRESSINGS) ×4 IMPLANT
GAUZE SPONGE 4X4 12PLY STRL LF (GAUZE/BANDAGES/DRESSINGS) IMPLANT
GAUZE XEROFORM 5X9 LF (GAUZE/BANDAGES/DRESSINGS) ×4 IMPLANT
GLOVE BIO SURGEON STRL SZ 6.5 (GLOVE) ×3 IMPLANT
GLOVE BIO SURGEONS STRL SZ 6.5 (GLOVE) ×1
GLOVE BIOGEL M STRL SZ7.5 (GLOVE) ×4 IMPLANT
GLOVE BIOGEL PI IND STRL 8 (GLOVE) ×2 IMPLANT
GLOVE BIOGEL PI INDICATOR 8 (GLOVE) ×2
GLOVE ECLIPSE 7.0 STRL STRAW (GLOVE) ×4 IMPLANT
GOWN STRL REUS W/ TWL LRG LVL3 (GOWN DISPOSABLE) IMPLANT
GOWN STRL REUS W/ TWL XL LVL3 (GOWN DISPOSABLE) ×4 IMPLANT
GOWN STRL REUS W/TWL LRG LVL3 (GOWN DISPOSABLE)
GOWN STRL REUS W/TWL XL LVL3 (GOWN DISPOSABLE) ×8
IMPL BREAST SILICONE 405CC (Breast) ×1 IMPLANT
IMPLANT BREAST SILICONE 405CC (Breast) ×4 IMPLANT
IV NS 500ML (IV SOLUTION) ×16
IV NS 500ML BAXH (IV SOLUTION) ×8 IMPLANT
KIT FILL SYSTEM UNIVERSAL (SET/KITS/TRAYS/PACK) ×1 IMPLANT
MARKER SKIN DUAL TIP RULER LAB (MISCELLANEOUS) ×4 IMPLANT
NDL HYPO 25X1 1.5 SAFETY (NEEDLE) ×1 IMPLANT
NDL SAFETY ECLIPSE 18X1.5 (NEEDLE) IMPLANT
NDL SPNL 18GX3.5 QUINCKE PK (NEEDLE) IMPLANT
NEEDLE HYPO 18GX1.5 SHARP (NEEDLE)
NEEDLE HYPO 25X1 1.5 SAFETY (NEEDLE) ×4 IMPLANT
NEEDLE SPNL 18GX3.5 QUINCKE PK (NEEDLE) IMPLANT
NS IRRIG 1000ML POUR BTL (IV SOLUTION) ×4 IMPLANT
PACK BASIN DAY SURGERY FS (CUSTOM PROCEDURE TRAY) ×4 IMPLANT
PEN SKIN MARKING BROAD TIP (MISCELLANEOUS) ×4 IMPLANT
PIN SAFETY STERILE (MISCELLANEOUS) IMPLANT
SHEETING SILICONE GEL EPI DERM (MISCELLANEOUS) IMPLANT
SLEEVE SCD COMPRESS KNEE MED (MISCELLANEOUS) ×4 IMPLANT
SPECIMEN JAR MEDIUM (MISCELLANEOUS) ×8 IMPLANT
SPONGE LAP 18X18 X RAY DECT (DISPOSABLE) ×8 IMPLANT
STRIP SUTURE WOUND CLOSURE 1/2 (SUTURE) ×4 IMPLANT
SUT ETHILON 3 0 PS 1 (SUTURE) IMPLANT
SUT MNCRL AB 3-0 PS2 18 (SUTURE) ×4 IMPLANT
SUT MON AB 2-0 CT1 36 (SUTURE) ×4 IMPLANT
SUT MON AB 5-0 PS2 18 (SUTURE) IMPLANT
SUT PROLENE 3 0 PS 2 (SUTURE) IMPLANT
SUT PROLENE 4 0 PS 2 18 (SUTURE) IMPLANT
SYR 20CC LL (SYRINGE) ×4 IMPLANT
SYR 50ML LL SCALE MARK (SYRINGE) ×8 IMPLANT
SYR BULB IRRIGATION 50ML (SYRINGE) ×4 IMPLANT
SYR CONTROL 10ML LL (SYRINGE) ×4 IMPLANT
TAPE HYPAFIX 6 X30' (GAUZE/BANDAGES/DRESSINGS) ×1
TAPE HYPAFIX 6X30 (GAUZE/BANDAGES/DRESSINGS) ×3 IMPLANT
TAPE MEASURE 72IN RETRACT (INSTRUMENTS)
TAPE MEASURE LINEN 72IN RETRCT (INSTRUMENTS) IMPLANT
TOWEL OR 17X24 6PK STRL BLUE (TOWEL DISPOSABLE) ×16 IMPLANT
TRAY DSU PREP LF (CUSTOM PROCEDURE TRAY) ×4 IMPLANT
TUBE CONNECTING 20'X1/4 (TUBING) ×1
TUBE CONNECTING 20X1/4 (TUBING) ×3 IMPLANT
UNDERPAD 30X30 (UNDERPADS AND DIAPERS) ×8 IMPLANT
VAC PENCILS W/TUBING CLEAR (MISCELLANEOUS) ×4 IMPLANT
YANKAUER SUCT BULB TIP NO VENT (SUCTIONS) ×4 IMPLANT

## 2017-06-30 NOTE — Transfer of Care (Signed)
Immediate Anesthesia Transfer of Care Note  Patient: Mary Jenkins  Procedure(s) Performed: REMOVAL OF RIGHT TISSUE EXPANDER WITH PLACEMENT OF RIGHT GELL BREAST IMPLANTS (Right Breast) RIGHT CAPSULECTOMY (Right Breast)  Patient Location: PACU  Anesthesia Type:General  Level of Consciousness: awake, sedated and patient cooperative  Airway & Oxygen Therapy: Patient Spontanous Breathing and Patient connected to face mask oxygen  Post-op Assessment: Report given to RN and Post -op Vital signs reviewed and stable  Post vital signs: Reviewed and stable  Last Vitals:  Vitals:   06/30/17 0632  BP: 115/80  Pulse: 78  Resp: 18  Temp: 36.8 C  SpO2: 96%    Last Pain:  Vitals:   06/30/17 3154  TempSrc: Oral  PainSc: 0-No pain         Complications: No apparent anesthesia complications

## 2017-06-30 NOTE — Discharge Instructions (Signed)
Activity (include date of return to work if known) As tolerated: NO showers NO driving No heavy activities  Diet:regular No restrictions:  Wound Care: Keep dressing clean & dry  Do not change dressings For Abdominoplasties wear abdominal binder Special Instructions: Do not raise arms up Continue to empty, recharge, & record drainage from J-P drains &/or Hemovacs 2-3 times a day, as needed. Call Doctor if any unusual problems occur such as pain, excessive Bleeding, unrelieved Nausea/vomiting, Fever &/or chills When lying down, keep head elevated on 2-3 pillows or back-rest For Addominoplasties the Jack-knife position Follow-up appointment: Please call the office.  The patient received discharge instruction from:___________________________________________   Patient signature ________________________________________ / Date___________    Signature of individual providing instructions/ Date________________            Activity (include date of return to work if known) As tolerated: NO showers NO driving No heavy activities  Diet:regular No restrictions:  Wound Care: Keep dressing clean & dry  Do not change dressings For Abdominoplasties wear abdominal binder Special Instructions: Do not raise arms up Continue to empty, recharge, & record drainage from J-P drains &/or Hemovacs 2-3 times a day, as needed. Call Doctor if any unusual problems occur such as pain, excessive Bleeding, unrelieved Nausea/vomiting, Fever &/or chills When lying down, keep head elevated on 2-3 pillows or back-rest For Addominoplasties the Jack-knife position Follow-up appointment: Please call the office.  The patient received discharge instruction from:___________________________________________   Patient signature ________________________________________ / Date___________    Signature of individual providing instructions/ Date________________                    Post Anesthesia Home Care  Instructions  Activity: Get plenty of rest for the remainder of the day. A responsible individual must stay with you for 24 hours following the procedure.  For the next 24 hours, DO NOT: -Drive a car -Paediatric nurse -Drink alcoholic beverages -Take any medication unless instructed by your physician -Make any legal decisions or sign important papers.  Meals: Start with liquid foods such as gelatin or soup. Progress to regular foods as tolerated. Avoid greasy, spicy, heavy foods. If nausea and/or vomiting occur, drink only clear liquids until the nausea and/or vomiting subsides. Call your physician if vomiting continues.  Special Instructions/Symptoms: Your throat may feel dry or sore from the anesthesia or the breathing tube placed in your throat during surgery. If this causes discomfort, gargle with warm salt water. The discomfort should disappear within 24 hours.  If you had a scopolamine patch placed behind your ear for the management of post- operative nausea and/or vomiting:  1. The medication in the patch is effective for 72 hours, after which it should be removed.  Wrap patch in a tissue and discard in the trash. Wash hands thoroughly with soap and water. 2. You may remove the patch earlier than 72 hours if you experience unpleasant side effects which may include dry mouth, dizziness or visual disturbances. 3. Avoid touching the patch. Wash your hands with soap and water after contact with the patch.

## 2017-06-30 NOTE — Anesthesia Procedure Notes (Signed)
Procedure Name: Intubation Date/Time: 06/30/2017 7:35 AM Performed by: Lyndee Leo Pre-anesthesia Checklist: Patient identified, Emergency Drugs available, Suction available and Patient being monitored Patient Re-evaluated:Patient Re-evaluated prior to induction Oxygen Delivery Method: Circle system utilized Preoxygenation: Pre-oxygenation with 100% oxygen Induction Type: IV induction Ventilation: Mask ventilation without difficulty Laryngoscope Size: Mac and 3 Grade View: Grade I Tube type: Oral Tube size: 7.0 mm Number of attempts: 1 Airway Equipment and Method: Stylet and Oral airway Placement Confirmation: ETT inserted through vocal cords under direct vision,  positive ETCO2 and breath sounds checked- equal and bilateral Secured at: 19 cm Tube secured with: Tape Dental Injury: Teeth and Oropharynx as per pre-operative assessment

## 2017-06-30 NOTE — Brief Op Note (Signed)
06/30/2017  8:18 AM  PATIENT:  Mary Jenkins  61 y.o. female  PRE-OPERATIVE DIAGNOSIS:  HISTORY OF BREAST CANCER  POST-OPERATIVE DIAGNOSIS:  HISTORY OF BREAST CANCER  PROCEDURE:  Procedure(s): REMOVAL OF RIGHT TISSUE EXPANDER WITH PLACEMENT OF RIGHT GELL BREAST IMPLANTS (Right) RIGHT CAPSULECTOMY (Right)  SURGEON:  Surgeon(s) and Role:    * Cristine Polio, MD - Primary  PHYSICIAN ASSISTANT:   ASSISTANTS: none   ANESTHESIA:   general  EBL:  No intake/output data recorded.  BLOOD ADMINISTERED:none  DRAINS: none   LOCAL MEDICATIONS USED:  LIDOCAINE   SPECIMEN:  Excision  DISPOSITION OF SPECIMEN:  PATHOLOGY  COUNTS:  YES  TOURNIQUET:  * No tourniquets in log *  DICTATION: .Other Dictation: Dictation Number [id number]  PLAN OF CARE: Discharge to home after PACU  PATIENT DISPOSITION:  PACU - hemodynamically stable.   Delay start of Pharmacological VTE agent (>24hrs) due to surgical blood loss or risk of bleeding: yes

## 2017-06-30 NOTE — Anesthesia Postprocedure Evaluation (Signed)
Anesthesia Post Note  Patient: Mary Jenkins  Procedure(s) Performed: REMOVAL OF RIGHT TISSUE EXPANDER WITH PLACEMENT OF RIGHT GELL BREAST IMPLANTS (Right Breast) RIGHT CAPSULECTOMY (Right Breast)     Patient location during evaluation: PACU Anesthesia Type: General Level of consciousness: awake and alert, patient cooperative and oriented Pain management: pain level controlled Vital Signs Assessment: post-procedure vital signs reviewed and stable Respiratory status: spontaneous breathing, nonlabored ventilation and respiratory function stable Cardiovascular status: blood pressure returned to baseline and stable Postop Assessment: no apparent nausea or vomiting Anesthetic complications: no    Last Vitals:  Vitals:   06/30/17 0830 06/30/17 0845  BP: 127/77 110/75  Pulse: 90 79  Resp: 16 17  Temp:    SpO2: 100% 96%    Last Pain:  Vitals:   06/30/17 0845  TempSrc:   PainSc: 0-No pain                 Jabree Pernice,E. Gladyse Corvin

## 2017-07-01 ENCOUNTER — Encounter (HOSPITAL_BASED_OUTPATIENT_CLINIC_OR_DEPARTMENT_OTHER): Payer: Self-pay | Admitting: Specialist

## 2017-07-01 NOTE — Op Note (Signed)
NAME:  EBELIN, DILLEHAY                     ACCOUNT NO.:  MEDICAL RECORD NO.:  [id number]  LOCATION:                                 FACILITY:  PHYSICIAN:  Berneta Sages L. Towanda Malkin, M.D.   DATE OF BIRTH:  DATE OF PROCEDURE:  06/30/2017 DATE OF DISCHARGE:                              OPERATIVE REPORT   INDICATION:  This is a 61 year old lady who has a history of right breast cancer, had a previous right modified radical mastectomy which has resolved.  Later on now, I put in tissue expansion for expansion of the area.  She has done well overall.  I over-expanded the right side where she had a cancer because the tissue was so tight that she has expanded very, very well.  It is now ready for removal of that tissue expander and implant.  I replaced with a permanent gel implant using a Mentor implant gel, a total of 405 mL.  The reference number was [id number] and serial number was [id number], total of 405 mL.  All procedures in detail were explained to the patient preoperatively.  The patient understands the potential risks and possible complications and consents to surgery.  PROCEDURES:  Removal of right tissue expander, right capsulotomy, replacement with gel implantation.  ANESTHESIA:  General.  DESCRIPTION OF PROCEDURE:  Preoperatively, the patient underwent drawings for the areas of the inframammary fold, midline of the chest, and anterolateral line.  She underwent general anesthesia and intubated orally.  Prep was done to the chest and breast areas in routine fashion using Hibiclens soap and solution, walled off with sterile towels and drapes so as to make a sterile field.  The previous transverse incision which was in the superior part of the breast reconstruction was entered that was the site of the previous modified radical mastectomy and this scar was excised.  Dissection then carried down through to the underlying pectoralis major muscle.  The muscle was divided in  the direction of its fibers and I was able to then remove the previous tissue expander.  Capsulotomies were fashioned from 12 o'clock to 6 and from 6 to 11 o'clock.  Then, the area was reconstructed with a Mentor implant as stated above with serial numbers, etc., with excellent symmetry as compared to the left breast.  The wounds were then closed with 2-0 Monocryl, muscle, fascia, subcutaneous closure and then the skin edge was reapproximated with a running subcuticular stitch of 3-0 Monocryl.  Steri-Strips and sterile dressing were applied to all areas. Estimated blood loss less than 25-50 mL.  Complications none.  Sterile dressings were applied, 4 x 4's, ABDs, and chest garment.  She was then taken to recovery in excellent condition.     Odella Aquas. Towanda Malkin, M.D.     Elie Confer  D:  06/30/2017  T:  07/01/2017  Job:  [id number]

## 2017-07-07 ENCOUNTER — Encounter (HOSPITAL_BASED_OUTPATIENT_CLINIC_OR_DEPARTMENT_OTHER): Payer: Self-pay | Admitting: Specialist

## 2017-07-14 ENCOUNTER — Telehealth: Payer: Self-pay | Admitting: Internal Medicine

## 2017-07-14 ENCOUNTER — Encounter (HOSPITAL_BASED_OUTPATIENT_CLINIC_OR_DEPARTMENT_OTHER): Payer: Self-pay | Admitting: Specialist

## 2017-07-14 NOTE — Telephone Encounter (Signed)
Called pt - stated she had stopped taking Sertraline b/c causing her to shake. Stated pharmacy told her rx was ready for pick-up this am (which did not call for refill) so Started taking it again this morning just to see what would happened and the shaking started again. And now, she needs something else for depression.

## 2017-07-14 NOTE — Telephone Encounter (Signed)
NEW MEDICATION FOR DEPRESSION,CAUSES HER TO  SHAKE, CAN'T TAKE, WOULD LIKE SOMETHING ELSE.

## 2017-07-17 MED ORDER — BUPROPION HCL 75 MG PO TABS: 75.0000 mg | ORAL_TABLET | Freq: Two times a day (BID) | ORAL | 0 refills | Status: DC

## 2017-07-17 NOTE — Telephone Encounter (Signed)
Instructed patient to stop taking sertraline. Will not use another ssri due to risk of patient developing serotonin syndrome. Prescribed wellbutrin 48m bid. Unable to call patient to inform her of change as her voicemail is not setup and she did not pick up phone call.

## 2017-07-18 DIAGNOSIS — G894 Chronic pain syndrome: Secondary | ICD-10-CM | POA: Diagnosis not present

## 2017-07-18 DIAGNOSIS — M542 Cervicalgia: Secondary | ICD-10-CM | POA: Diagnosis not present

## 2017-07-18 NOTE — Telephone Encounter (Signed)
Called pt - VM has not been set-up; unable to leave  message.

## 2017-07-21 ENCOUNTER — Other Ambulatory Visit: Payer: Self-pay | Admitting: Internal Medicine

## 2017-07-21 DIAGNOSIS — J449 Chronic obstructive pulmonary disease, unspecified: Secondary | ICD-10-CM

## 2017-07-21 NOTE — Telephone Encounter (Signed)
Can we try to call the patient again please about wellbutrin. Thanks!

## 2017-07-22 NOTE — Telephone Encounter (Signed)
Called pt again - VM has not been set up, unable to message. I called her daughter, [name] - stated telephone# on chart is correct; explained unable to leave a message. Stated when she sees her mother, she will ask her to call us.

## 2017-07-22 NOTE — Telephone Encounter (Signed)
Thank you for letting me know

## 2017-07-22 NOTE — Telephone Encounter (Signed)
Call back from pt - states has a new phone/VM had not been set up;  had start taking the new med (Wellbutrin) as prescribed and shaking started again. States she will try for 1 more day (today) and let us know.

## 2017-07-28 ENCOUNTER — Telehealth: Payer: Self-pay | Admitting: Internal Medicine

## 2017-07-28 ENCOUNTER — Encounter: Payer: Self-pay | Admitting: Internal Medicine

## 2017-07-28 ENCOUNTER — Ambulatory Visit: Payer: Medicare Other

## 2017-07-28 NOTE — Telephone Encounter (Signed)
Patient is taking medicine for depression and it has her where she cant go to the bathroom, pls call her

## 2017-07-28 NOTE — Telephone Encounter (Signed)
appt given for 11/5 at [facility] for severe constipation r/t medication

## 2017-07-30 NOTE — Telephone Encounter (Signed)
albuterol (PROVENTIL HFA;VENTOLIN HFA) 108 (90 Base) MCG/ACT inhaler   Refill request @ [facility] on [address].

## 2017-08-01 ENCOUNTER — Encounter: Payer: Self-pay | Admitting: Internal Medicine

## 2017-08-01 ENCOUNTER — Ambulatory Visit (INDEPENDENT_AMBULATORY_CARE_PROVIDER_SITE_OTHER): Payer: Medicare Other | Admitting: Internal Medicine

## 2017-08-01 DIAGNOSIS — R634 Abnormal weight loss: Secondary | ICD-10-CM | POA: Diagnosis present

## 2017-08-01 DIAGNOSIS — J449 Chronic obstructive pulmonary disease, unspecified: Secondary | ICD-10-CM | POA: Diagnosis not present

## 2017-08-01 MED ORDER — ALBUTEROL SULFATE HFA 108 (90 BASE) MCG/ACT IN AERS: INHALATION_SPRAY | RESPIRATORY_TRACT | 3 refills | Status: DC

## 2017-08-01 MED ORDER — MEGESTROL ACETATE 40 MG PO TABS
40.0000 mg | ORAL_TABLET | Freq: Two times a day (BID) | ORAL | 0 refills | Status: AC
Start: 2017-08-01 — End: 2017-08-21

## 2017-08-01 MED ORDER — MIRTAZAPINE 15 MG PO TABS: 15.0000 mg | ORAL_TABLET | Freq: Every day | ORAL | 0 refills | Status: DC

## 2017-08-01 NOTE — Patient Instructions (Addendum)
It was a pleasure to see you today Ms. Mary Jenkins. Please make the following changes:  -Get CT chest done -Start taking mirtazapine 75m nightly -Start taking megace 45mtwice daily -Follow up in 4 weeks  If you have any questions or concerns, please call our clinic at [phone] 9am-5pm and after hours call [phone] and ask for the internal medicine resident on call. If you feel you are having a medical emergency please call 911.   Thank you, we look forward to help you remain healthy!  VaLars MageMD Internal Medicine PGY1

## 2017-08-01 NOTE — Progress Notes (Signed)
CC: Weight loss.  HPI:  [name] is a 61 y.o. f with pmh of breast cancer, copd, gerd who presents for weight loss.  Please see problem based charting for evaluation, assessment, and plan.  Past Medical History:  Diagnosis Date  . Breast cancer ([place])   . Cataract, immature    bilateral  . COPD (chronic obstructive pulmonary disease) ([place])    no home O2  . Cough 02/09/2015  . DDD (degenerative disc disease), cervical   . Exertional shortness of breath    states if she "takes her time" doing activities does not get SOB  . Family history of adverse reaction to anesthesia    pt's sister has hx. of post-op N/V  . Full dentures   . GERD (gastroesophageal reflux disease) 02/03/2012  . High cholesterol    no current med.  Marland Kitchen History of breast cancer   . Personal history of chemotherapy 1997  . Runny nose 02/09/2015   clear drainage, per pt.   Review of Systems:  Denies headache, nausea/vomiting, fevers, abdominal pain, diarrhea  Physical Exam:  Vitals:   08/01/17 1614  BP: (!) 97/58  Pulse: 96  Temp: 97.9 F (36.6 C)  TempSrc: Oral  SpO2: 99%  Weight: 99 lb 12.8 oz (45.3 kg)  Height: 5' 6" (1.676 m)   Physical Exam  Constitutional: She appears well-developed and well-nourished.  HENT:  Head: Normocephalic and atraumatic.  Eyes: Conjunctivae are normal.  Cardiovascular: Normal rate, regular rhythm and normal heart sounds.  Pulmonary/Chest: Effort normal and breath sounds normal. No respiratory distress. She has no wheezes.  Abdominal: Soft. Bowel sounds are normal. She exhibits no distension. There is no tenderness.  Neurological: She is alert.  Psychiatric: She has a normal mood and affect. Her behavior is normal. Judgment and thought content normal.     Assessment & Plan:   See Encounters Tab for problem based charting.  Patient seen with Dr. Beryle Beams

## 2017-08-02 DIAGNOSIS — R634 Abnormal weight loss: Secondary | ICD-10-CM | POA: Insufficient documentation

## 2017-08-02 NOTE — Assessment & Plan Note (Signed)
Patient presents stating that she has had excessive weight loss recently.  Today (08/01/17) she weighs 99lbs. Per chart review, her baseline weight appears to be 115-120 pounds. She has lost approximately 15lbs in 8 months. She has not been trying to lose weight intentionally.  The patient has not had any nausea/vomiting, fevers, pain, diarrhea, or abdominal pain.   She is in remission for her breast cancer and has had regular mammograms that have been normal.   Patient's TSH was 0.3156 in 2012.  The patient's last TSH was checked in 2016 where it was 0.94.  The patient was a former 2 ppd smoker. She has had a chest x-ray done 06/17/17 which did not show any cardiopulmonary disease.  The patient stated that her sister died of endometrial cancer recently and she has been very upset about it.   -Ordered CT scan  -TSH  -CBC no diff -BMP  -Prescribed mirtazapine 66m qhs for depression symptoms and weight gain. The patient has previously not tolerated sertraline and bupropion well and states that she started shaking after using the medication.  -Prescribed megace 470mbid

## 2017-08-03 NOTE — Progress Notes (Signed)
Medicine attending: I personally interviewed and examined this patient on the day of the patient visit and reviewed pertinent clinical ,laboratory, and radiographic data  with resident physician Dr. Lars Mage and we discussed a management plan. Unintentional 20 lb weight loss in lady w remote hx of breast cancer dx in 1997; recent TRAM reconstruction. No new dis. Recent CXR normal. No GI or other localizing symptoms.No focal abnormal findings on exam. She does have a hx of depression, worse recently after a sister died of endometrial ca in 2022/12/09. She stopped taking her antidepressants due to intolerance. She is hypothyroid on replacement. We will check thyroid functions, basic lab profile & CBC then assess need for further testing. Begin Megace 80 mg BID to stimulate appetite. Trial of mirtazapine for depression. Short interval follow up.

## 2017-08-04 ENCOUNTER — Other Ambulatory Visit (INDEPENDENT_AMBULATORY_CARE_PROVIDER_SITE_OTHER): Payer: Medicare Other

## 2017-08-04 DIAGNOSIS — R634 Abnormal weight loss: Secondary | ICD-10-CM | POA: Diagnosis not present

## 2017-08-04 DIAGNOSIS — Z681 Body mass index (BMI) 19 or less, adult: Secondary | ICD-10-CM

## 2017-08-05 LAB — BMP8+ANION GAP
ANION GAP: 16 mmol/L (ref 10.0–18.0)
BUN/Creatinine Ratio: 13 (ref 12–28)
BUN: 12 mg/dL (ref 8–27)
CALCIUM: 9.2 mg/dL (ref 8.7–10.3)
CO2: 21 mmol/L (ref 20–29)
CREATININE: 0.89 mg/dL (ref 0.57–1.00)
Chloride: 106 mmol/L (ref 96–106)
GFR calc Af Amer: 81 mL/min/{1.73_m2} (ref >59)
GFR, EST NON AFRICAN AMERICAN: 70 mL/min/{1.73_m2} (ref >59)
GLUCOSE: 89 mg/dL (ref 65–99)
POTASSIUM: 5.2 mmol/L (ref 3.5–5.2)
Sodium: 143 mmol/L (ref 134–144)

## 2017-08-05 LAB — TSH: TSH: 1.66 u[IU]/mL (ref 0.450–4.500)

## 2017-08-05 LAB — CBC
HEMOGLOBIN: 13.6 g/dL (ref 11.1–15.9)
Hematocrit: 44.9 % (ref 34.0–46.6)
MCH: 26.2 pg — ABNORMAL LOW (ref 26.6–33.0)
MCHC: 30.3 g/dL — ABNORMAL LOW (ref 31.5–35.7)
MCV: 87 fL (ref 79–97)
Platelets: 326 10*3/uL (ref 150–379)
RBC: 5.19 x10E6/uL (ref 3.77–5.28)
RDW: 18.1 % — ABNORMAL HIGH (ref 12.3–15.4)
WBC: 7.5 10*3/uL (ref 3.4–10.8)

## 2017-08-25 ENCOUNTER — Ambulatory Visit (INDEPENDENT_AMBULATORY_CARE_PROVIDER_SITE_OTHER): Payer: Medicare Other | Admitting: Internal Medicine

## 2017-08-25 ENCOUNTER — Encounter: Payer: Self-pay | Admitting: Internal Medicine

## 2017-08-25 VITALS — BP 112/74 | HR 96 | Ht 66.0 in | Wt 106.4 lb

## 2017-08-25 DIAGNOSIS — J449 Chronic obstructive pulmonary disease, unspecified: Secondary | ICD-10-CM

## 2017-08-25 DIAGNOSIS — J441 Chronic obstructive pulmonary disease with (acute) exacerbation: Secondary | ICD-10-CM

## 2017-08-25 MED ORDER — PREDNISONE 10 MG PO TABS: ORAL_TABLET | ORAL | 0 refills | Status: DC
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
## 2017-08-25 NOTE — Patient Instructions (Signed)
We will be referring you to pulmonary rehab   Prednisone 10 mg take  4 each am x 2 days,   2 each am x 2 days,  1 each am x 2 days and stop    Please schedule a follow up visit in 3 months but call sooner if needed
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
## 2017-08-25 NOTE — Assessment & Plan Note (Signed)
PFT performed on 06/27/2011. Poor quality.    - FEV1 0.39 ( 16%) ratio 23 and 16% better p B2    - 03/13/2012  Inhaler technique 90% with dpi but only 50% with mdi > 50% 04/22/12   - Alpha one genotype 02/13/12 > MM   - 03/22/2013  PFT's 0.56 ( 24%) ratio 33 but 12% better improvement and DLCO 21 corrects to 28%    started Incruse 11/11/14 p spiriva off formulary> d/c 02/28/2015 due to cough  - changed incruse to spiriva respimat 02/28/2015 due to cough  -Spirometry  05/31/2016  FEV1 0.52 (23%)  Ratio 32   - referred to rehab 05/31/2016 >>> did not go   - 08/25/2017  After extensive coaching HFA effectiveness =  75% from a baseline of 50% - referred to rehab again 08/25/2017       Will need new set of pfts for pulmonary rehab referral next but also work much harder on consider HFA/SMI technique as above   I had an extended discussion with the patient reviewing all relevant studies completed to date and  lasting 15 to 20 minutes of a 25 minute visit    Each maintenance medication was reviewed in detail including most importantly the difference between maintenance and prns and under what circumstances the prns are to be triggered using an action plan format that is not reflected in the computer generated alphabetically organized AVS.    Please see AVS for specific instructions unique to this visit that I personally wrote and verbalized to the the pt in detail and then reviewed with pt  by my nurse highlighting any  changes in therapy recommended at today's visit to their plan of care.

## 2017-08-29 ENCOUNTER — Ambulatory Visit (INDEPENDENT_AMBULATORY_CARE_PROVIDER_SITE_OTHER): Payer: Medicare Other | Admitting: Internal Medicine

## 2017-08-29 DIAGNOSIS — J449 Chronic obstructive pulmonary disease, unspecified: Secondary | ICD-10-CM | POA: Diagnosis not present

## 2017-08-29 LAB — PULMONARY FUNCTION TEST
DL/VA % pred: 33 %
DL/VA: 1.68 ml/min/mmHg/L
DLCO UNC % PRED: 24 %
DLCO unc: 6.48 ml/min/mmHg
FEF 25-75 POST: 0.26 L/s
FEF 25-75 PRE: 0.23 L/s
FEF2575-%CHANGE-POST: 11 %
FEF2575-%PRED-PRE: 10 %
FEF2575-%Pred-Post: 11 %
FEV1-%Change-Post: 5 %
FEV1-%PRED-POST: 22 %
FEV1-%Pred-Pre: 21 %
FEV1-Post: 0.51 L
FEV1-Pre: 0.48 L
FEV1FVC-%CHANGE-POST: 1 %
FEV1FVC-%PRED-PRE: 39 %
FEV6-%CHANGE-POST: 6 %
FEV6-%PRED-PRE: 52 %
FEV6-%Pred-Post: 56 %
FEV6-Post: 1.55 L
FEV6-Pre: 1.46 L
FEV6FVC-%Change-Post: 3 %
FEV6FVC-%PRED-POST: 99 %
FEV6FVC-%Pred-Pre: 95 %
FVC-%Change-Post: 3 %
FVC-%Pred-Post: 56 %
FVC-%Pred-Pre: 54 %
FVC-Post: 1.62 L
FVC-Pre: 1.56 L
POST FEV6/FVC RATIO: 96 %
PRE FEV1/FVC RATIO: 31 %
Post FEV1/FVC ratio: 32 %
Pre FEV6/FVC Ratio: 93 %
RV % pred: 244 %
RV: 5.17 L
TLC % PRED: 133 %
TLC: 7.12 L

## 2017-08-29 NOTE — Progress Notes (Signed)
PFT done today.

## 2017-09-09 ENCOUNTER — Telehealth: Payer: Self-pay | Admitting: Internal Medicine

## 2017-09-09 MED ORDER — AZITHROMYCIN 250 MG PO TABS: ORAL_TABLET | ORAL | 0 refills | Status: DC

## 2017-09-09 NOTE — Telephone Encounter (Signed)
z-pak 

## 2017-09-09 NOTE — Telephone Encounter (Signed)
Spoke with pt. States that she is not feeling well. Reports cough, runny nose and chest congestion. Cough is producing yellow mucus. Denies chest tightness, wheezing, SOB or fever. Symptoms started 3-4 days ago. Pt has not tried any OTC meds for her symptoms. Would like to have something called in to the pharmacy.  MW - please advise. Thanks.

## 2017-09-09 NOTE — Telephone Encounter (Signed)
Called pt and advised message from the provider. Pt understood and verbalized understanding. Nothing further is needed.   Spoke with pt and advised rx sent to pharmacy. Nothing further is needed.

## 2017-11-17 ENCOUNTER — Other Ambulatory Visit (HOSPITAL_COMMUNITY)
Admission: RE | Admit: 2017-11-17 | Discharge: 2017-11-17 | Disposition: A | Discharge status: Home / Self Care | Payer: Medicare Other | Source: Ambulatory Visit | Attending: Internal Medicine | Admitting: Internal Medicine

## 2017-11-17 ENCOUNTER — Encounter (INDEPENDENT_AMBULATORY_CARE_PROVIDER_SITE_OTHER): Payer: Self-pay

## 2017-11-17 ENCOUNTER — Other Ambulatory Visit: Payer: Self-pay

## 2017-11-17 ENCOUNTER — Ambulatory Visit (INDEPENDENT_AMBULATORY_CARE_PROVIDER_SITE_OTHER): Payer: Medicare Other | Admitting: Internal Medicine

## 2017-11-17 ENCOUNTER — Encounter: Payer: Self-pay | Admitting: Internal Medicine

## 2017-11-17 VITALS — BP 108/70 | HR 76 | Temp 97.7°F | Ht 66.0 in | Wt 102.4 lb

## 2017-11-17 DIAGNOSIS — L988 Other specified disorders of the skin and subcutaneous tissue: Secondary | ICD-10-CM

## 2017-11-17 DIAGNOSIS — Z853 Personal history of malignant neoplasm of breast: Secondary | ICD-10-CM | POA: Diagnosis not present

## 2017-11-17 DIAGNOSIS — J449 Chronic obstructive pulmonary disease, unspecified: Secondary | ICD-10-CM

## 2017-11-17 DIAGNOSIS — L989 Disorder of the skin and subcutaneous tissue, unspecified: Secondary | ICD-10-CM | POA: Insufficient documentation

## 2017-11-17 DIAGNOSIS — G4700 Insomnia, unspecified: Secondary | ICD-10-CM | POA: Diagnosis not present

## 2017-11-17 DIAGNOSIS — Z681 Body mass index (BMI) 19 or less, adult: Secondary | ICD-10-CM | POA: Diagnosis not present

## 2017-11-17 DIAGNOSIS — Z87891 Personal history of nicotine dependence: Secondary | ICD-10-CM | POA: Diagnosis not present

## 2017-11-17 DIAGNOSIS — R634 Abnormal weight loss: Secondary | ICD-10-CM | POA: Diagnosis not present

## 2017-11-17 DIAGNOSIS — L821 Other seborrheic keratosis: Secondary | ICD-10-CM | POA: Diagnosis not present

## 2017-11-17 NOTE — Progress Notes (Signed)
CC: Weight loss   HPI:  Mary Jenkins is a 62 y.o. female with history of COPD, history of breast cancer, and insomnia presents to be evaluated for weight loss. Please see problem based charting for evaluation, assessment, and plan.  Past Medical History:  Diagnosis Date  . Breast cancer ([place])   . Cataract, immature    bilateral  . COPD (chronic obstructive pulmonary disease) ([place])    no home O2  . Cough 02/09/2015  . DDD (degenerative disc disease), cervical   . Exertional shortness of breath    states if she "takes her time" doing activities does not get SOB  . Family history of adverse reaction to anesthesia    pt's sister has hx. of post-op N/V  . Full dentures   . GERD (gastroesophageal reflux disease) 02/03/2012  . High cholesterol    no current med.  Marland Kitchen History of breast cancer   . Personal history of chemotherapy 1997  . Runny nose 02/09/2015   clear drainage, per pt.   Review of Systems:  No nausea, vomiting, stomach pain  Has occasional cough, sore throat, and night sweats  Physical Exam:  Vitals:   11/17/17 1316  BP: 108/70  Pulse: 76  Temp: 97.7 F (36.5 C)  SpO2: 95%  Weight: 102 lb 6.4 oz (46.4 kg)  Height: 5' 6" (1.676 m)   Physical Exam  Constitutional: She appears well-developed and well-nourished. No distress.  HENT:  Head: Normocephalic and atraumatic.  Eyes: Conjunctivae are normal.  Cardiovascular: Normal rate, regular rhythm and normal heart sounds.  Respiratory: Effort normal and breath sounds normal. She has no wheezes.  GI: Soft. Bowel sounds are normal. She exhibits no distension. There is no tenderness.  Musculoskeletal: She exhibits no edema.  Neurological: She is alert.  Skin: She is not diaphoretic.  There are two patches 1 cm sized hyperpigmented lesions present in the left upper back. The darker of the lesions is annular and raised. The lighter of the lesions is 2cm in sizes, is different shades of pigments, the borders are  uneven, and is asymmetric  Psychiatric: She has a normal mood and affect. Her behavior is normal. Judgment and thought content normal.        Assessment & Plan:   See Encounters Tab for problem based charting.  Patient seen with Dr. Daryll Drown

## 2017-11-17 NOTE — Patient Instructions (Signed)
It was a pleasure to see you today Ms. Grasmick. Please make the following changes:  -Please get CT scan of chest, abdomen, and pelvis -Please go to counseling  If you have any questions or concerns, please call our clinic at [phone] between 9am-5pm and after hours call [phone] and ask for the internal medicine resident on call. If you feel you are having a medical emergency please call 911.   Thank you, we look forward to help you remain healthy!  Lars Mage, MD Internal Medicine PGY1

## 2017-11-18 NOTE — Assessment & Plan Note (Signed)
The patient's weight during this visit was 102.4lbs and during last visit in December 2018 she weighed 106.4 pounds. During last visit TSH, CBC, and bmp were ordered which were all normal and did not show any signs of anemia, electrolyte abnormalities, or thyroid dysfunction. The patient seems to have had a rapid decline in her weight since 2016.   The patient was a former smoker who stopped smoking in 2012. She is in remission post breast cancer that was diagnosed in 1997. The patient's sister also died in 28-Nov-2016 and she has been having a hard time with this and feels that this may have further increased her weight loss.   Assessment  There is concern for possible malignancy causing the patient's rapid weight loss. She is a former smoker and had breast cancer previously which places her at increased risk.   Plan  -CT chest, abdomen and pelvis  -Referred patient to get grief counseling regarding sister's death

## 2017-11-18 NOTE — Assessment & Plan Note (Signed)
The patient mentioned that she was worried about skin changes persent in her left upper back. There are two patches 1-2 cm sized hyperpigmented lesions. The darker of the lesions is annular and raised. The lighter of the lesions is 2cm in sizes, is different shades of pigments, the borders are uneven, and is asymmetric.  On review of patient's records the skin changes were noted since 2016. 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   Assessment The skin changes appear melanotic in nature. The assymetry, color changes, asymmetry, and irregular borders is worrisome.   Plan Shave biopsy was done and sample was sent to pathology.

## 2017-11-19 NOTE — Progress Notes (Addendum)
Internal Medicine Clinic Attending  I saw and evaluated the patient.  I personally confirmed the key portions of the history and exam documented by Dr. Maricela Bo and I reviewed pertinent patient test results.  The assessment, diagnosis, and plan were formulated together and I agree with the documentation in the resident's note.  Skin lesion - shave biopsy After obtaining informed consent, the area was prepped and draped in the usual fashion.  Anesthesia was obtained with 1% lidocaine, about 15m. The superficial layers of the lesion were removed by shave biopsy. Hemostasis obtained with pressure. Wound care discussed. Specimen sent for dermatopathology.

## 2017-11-26 ENCOUNTER — Ambulatory Visit (INDEPENDENT_AMBULATORY_CARE_PROVIDER_SITE_OTHER): Payer: Medicare Other | Admitting: Internal Medicine

## 2017-11-26 ENCOUNTER — Encounter: Payer: Self-pay | Admitting: Internal Medicine

## 2017-11-26 VITALS — BP 102/60 | HR 90 | Ht 66.0 in | Wt 99.6 lb

## 2017-11-26 DIAGNOSIS — J449 Chronic obstructive pulmonary disease, unspecified: Secondary | ICD-10-CM

## 2017-11-26 DIAGNOSIS — R634 Abnormal weight loss: Secondary | ICD-10-CM

## 2017-11-26 MED ORDER — PREDNISONE 10 MG PO TABS
ORAL_TABLET | ORAL | 0 refills | Status: DC
Start: 2017-11-26 — End: 2017-12-18

## 2017-11-26 MED ORDER — TIOTROPIUM BROMIDE MONOHYDRATE 2.5 MCG/ACT IN AERS: INHALATION_SPRAY | RESPIRATORY_TRACT | 11 refills | Status: DC

## 2017-11-26 NOTE — Progress Notes (Signed)
Subjective:    Patient ID: Mary Jenkins, female    DOB: Aug 31, 1956  MRN: [id number]    Brief patient profile:   41 yobf quit smoking  07/24/11 not taking breathing medications at that point and subsequently placed on multiple meds and still sob just getting dressed so referred from the medicine clinic at [facility] 02/13/2012 for pulmonary eval with documented GOLD IV COPD 02/2013     Brief patient profile:  02/13/2012 1st pulmonary cc progressive worse x 6 months doe x 50-100 ft can't do a grocery store where could do before quit. No real variability, cough is better with no excess or purulent sputum.  Not using saba daytime because finds if she's holding still doesn't need it as oftern. Some better p last  Prednisone rx. Not much variability. rec Stop atrovent and advair Start symbiocort Take 2 puffs first thing in am and then another 2 puffs about 12 hours later.  Take after only am dose Spiriva Protonix 40 mg Take 30-60 min before first meal of the day  GERD diet Only use your albuterol (Plan B= ventolin puffer,  Plan C is nebulizer) as a rescue medication to be used if you can't catch your breath by resting or doing a relaxed purse lip breathing pattern. The less you use it, the better it will work when you need it. Ok to use rescue up to every 4 hours if doing poorly.          02/21/2017  f/u ov/Mary Jenkins re: GOLD IV  Copd/ sym/ spiriva maint rx  Chief Complaint  Patient presents with  . Follow-up    Breathing is overall doing well. She is using her albuterol inhaler 2-3 x per day on average and neb with albuterol maybe once per wk.    MMRC2 = can't walk a nl pace on a flat grade s sob but does fine slow and flat pushes the cart most times at [employer] and even sometimes at [employer]  rec No change in medications - stay as active as you can but pace yourself Plan A = Automatic = Symbicort/ spiriva daily  Plan B = Backup Only use your albuterol as a rescue medication  Plan C = Crisis -  only use your albuterol nebulizer if you first try Plan B  mucinex dm up to 1200 mg every 12 hours as needed for cough  Bring meds to next ov      11/26/2017  f/u ov/Mary Jenkins re:  GOLD IV copd sym/spriva off remeron and losing wt ? Why > w/u in progress by pcp Chief Complaint  Patient presents with  . Follow-up    Breathing is overall doing well. She states having minimal chest tightness for approx 2 wks. She is using her albuterol inhaler 2 x per day on average and neb once per wk. She has been losing some wt unintentionally. Lack of appetite.    Dyspnea:  MMRC3 = can't walk 100 yards even at a slow pace at a flat grade s stopping due to sob  /ok [employer] leaning on cart Cough: rattling/ min productive  Sleep: ok no am flare  SABA use:  Twice daily saba / neb once a week   No obvious day to day or daytime variability or assoc excess/ purulent sputum or mucus plugs or hemoptysis or cp or chest tightness, subjective wheeze or overt sinus or hb symptoms. No unusual exposure hx or h/o childhood pna/ asthma or knowledge of premature birth.  Sleeping  ok flat without nocturnal  or early am exacerbation  of respiratory  c/o's or need for noct saba. Also denies any obvious fluctuation of symptoms with weather or environmental changes or other aggravating or alleviating factors except as outlined above   Current Allergies, Complete Past Medical History, Past Surgical History, Family History, and Social History were reviewed in [employer] record.  ROS  The following are not active complaints unless bolded Hoarseness, sore throat, dysphagia, dental problems, itching, sneezing,  nasal congestion or discharge of excess mucus or purulent secretions, ear ache,   fever, chills, sweats, unintended wt loss or wt gain, classically pleuritic or exertional cp,  orthopnea pnd or leg swelling, presyncope, palpitations, abdominal pain, anorexia, nausea, vomiting, diarrhea  or change in bowel  habits or change in bladder habits, change in stools or change in urine, dysuria, hematuria,  rash, arthralgias, visual complaints, headache, numbness, weakness or ataxia or problems with walking or coordination,  change in mood/affect or memory.        Current Meds  Medication Sig  . albuterol (PROVENTIL) (2.5 MG/3ML) 0.083% nebulizer solution Take 3 mLs (2.5 mg total) by nebulization every 6 (six) hours as needed for wheezing. For wheezing  . albuterol (VENTOLIN HFA) 108 (90 Base) MCG/ACT inhaler INHALE 2 PUFFS BY MOUTH EVERY 6 HOURS AS NEEDED FOR WHEEZING  . budesonide-formoterol (SYMBICORT) 160-4.5 MCG/ACT inhaler Inhale 2 puffs into the lungs 2 (two) times daily.  Marland Kitchen omeprazole (PRILOSEC) 20 MG capsule Take 1 capsule (20 mg total) by mouth daily as needed.  . RESTASIS 0.05 % ophthalmic emulsion Place into both eyes 2 (two) times daily.  . Tiotropium Bromide Monohydrate (SPIRIVA RESPIMAT) 2.5 MCG/ACT AERS INHALE TWO PUFFS BY MOUTH ONCE DAILY IN THE MORNING  .                        Objective:   Physical Exam    amb thin bf nad   Vital signs reviewed - Note on arrival 02 sats  98% on RA      Wt 120 02/13/2012  > 119 03/13/2012 > 04/22/2012 117 > 07/21/2012 122> 12/02/2012  131> 03/22/2013  131 > 04/19/2013  129  > 07/19/2013 131 > 06/06/2014 129 >  11/11/2014  132> 02/28/2015   129 > 01/16/2016  115 >  02/29/2016  110 > 05/31/2016  115  > 11/21/2016   117 > 02/21/2017   107 > 06/16/2017 102 > 08/25/2017   106 > 11/26/2017 99     HEENT: nl  turbinates bilaterally, and oropharynx. Nl external ear canals without cough reflex - . Full dentures    NECK :  without JVD/Nodes/TM/ nl carotid upstrokes bilaterally   LUNGS: no acc muscle use,  Barrel contour chest wall with bilateral  Distant bs  without cough on insp or exp maneuver and hyper resonant to  percussion bilaterally     CV:  RRR  no s3 or murmur or increase in P2, and no edema   ABD:  soft and nontender with  pos hoover's mid insp in in  the supine position. No bruits or organomegaly appreciated, bowel sounds nl  MS:  Nl gait/ ext warm without deformities, calf tenderness, cyanosis or clubbing No obvious joint restrictions   SKIN: warm and dry without lesions    NEURO:  alert, approp, nl sensorium with  no motor or cerebellar deficits apparent.  Assessment & Plan:

## 2017-11-26 NOTE — Assessment & Plan Note (Signed)
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

## 2017-11-26 NOTE — Patient Instructions (Addendum)
Prednisone 10 mg take  4 each am x 2 days,   2 each am x 2 days,  1 each am x 2 days and stop    Work on inhaler technique:  relax and gently blow all the way out then take a nice smooth deep breath back in, triggering the inhaler at same time you start breathing in.  Hold for up to 5 seconds if you can. Blow out thru nose. Rinse and gargle with water when done      Please schedule a follow up visit in 3 months but call sooner if needed

## 2017-11-26 NOTE — Assessment & Plan Note (Signed)
PFT performed on 06/27/2011. Poor quality.    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 prior to study with DLCO  24 % corrects to 33 % for alv volume    - 11/26/2017  After extensive coaching inhaler device  effectiveness =    75% (short Ti) - referred to rehab again 11/26/2017    Mild flare s fever/ purulent sputum > rx  pred x 6 days and really does need to pursue rehab at this point as on max rx o/w  I had an extended discussion with the patient reviewing all relevant studies completed to date and  lasting 15 to 20 minutes of a 25 minute visit    Each maintenance medication was reviewed in detail including most importantly the difference between maintenance and prns and under what circumstances the prns are to be triggered using an action plan format that is not reflected in the computer generated alphabetically organized AVS.    Please see AVS for specific instructions unique to this visit that I personally wrote and verbalized to the the pt in detail and then reviewed with pt  by my nurse highlighting any  changes in therapy recommended at today's visit to their plan of care.

## 2017-12-02 ENCOUNTER — Telehealth (HOSPITAL_COMMUNITY): Payer: Self-pay

## 2017-12-02 NOTE — Telephone Encounter (Signed)
Called to speak with patient in regards to Pulmonary Rehab - Patient cannot afford program. I offered Maintenance self pay to patient and she also stated she could not afford that. Closed referral.

## 2017-12-02 NOTE — Telephone Encounter (Signed)
Patients insurance is active and benefits verified through Medicare A/B - Patient is responsible for 20% until deductible has been met.

## 2017-12-03 DIAGNOSIS — F331 Major depressive disorder, recurrent, moderate: Secondary | ICD-10-CM | POA: Diagnosis not present

## 2017-12-03 DIAGNOSIS — F4310 Post-traumatic stress disorder, unspecified: Secondary | ICD-10-CM | POA: Diagnosis not present

## 2017-12-08 ENCOUNTER — Ambulatory Visit (HOSPITAL_COMMUNITY)
Admission: RE | Admit: 2017-12-08 | Discharge: 2017-12-08 | Disposition: A | Discharge status: Home / Self Care | Payer: Medicare Other | Source: Ambulatory Visit | Attending: Internal Medicine | Admitting: Internal Medicine

## 2017-12-08 ENCOUNTER — Encounter (HOSPITAL_COMMUNITY): Payer: Self-pay

## 2017-12-08 DIAGNOSIS — R634 Abnormal weight loss: Secondary | ICD-10-CM | POA: Diagnosis not present

## 2017-12-08 DIAGNOSIS — J439 Emphysema, unspecified: Secondary | ICD-10-CM | POA: Diagnosis not present

## 2017-12-08 DIAGNOSIS — K7689 Other specified diseases of liver: Secondary | ICD-10-CM | POA: Diagnosis not present

## 2017-12-12 ENCOUNTER — Telehealth: Payer: Self-pay | Admitting: Internal Medicine

## 2017-12-12 MED ORDER — DOXYCYCLINE HYCLATE 100 MG PO TABS: 100.0000 mg | ORAL_TABLET | Freq: Two times a day (BID) | ORAL | 0 refills | Status: DC

## 2017-12-12 MED ORDER — PREDNISONE 10 MG PO TABS: ORAL_TABLET | ORAL | 0 refills | Status: DC

## 2017-12-12 NOTE — Telephone Encounter (Signed)
Pt is aware of below recommendations and voiced her understanding. Rx for doxy and prednisone has been sent to preferred pharmacy. Nothing further is needed.

## 2017-12-12 NOTE — Telephone Encounter (Signed)
Spoke with pt  She is having a COPD flare- increased cough- yellow sputum, chest tightness, SOB, wheezing Onset was 4 days ago  She has not had any fever She is asking for pred taper  MR- MW out of the office until 3/25 Please advise, thanks!  No Known Allergies

## 2017-12-12 NOTE — Telephone Encounter (Signed)
Per MR-give patient Doxycyline 100 mg #10 take po BID x 5days and Prednisone 40 mg x 2 days, 30 mg x 2 days, 20 mg x 2 days, 10 mg x 2 days 5 mg x 2 days no refills.

## 2017-12-15 ENCOUNTER — Encounter (HOSPITAL_COMMUNITY): Payer: Self-pay | Admitting: Emergency Medicine

## 2017-12-15 ENCOUNTER — Emergency Department (HOSPITAL_COMMUNITY): Payer: Medicare Other

## 2017-12-15 ENCOUNTER — Other Ambulatory Visit: Payer: Self-pay

## 2017-12-15 ENCOUNTER — Emergency Department (HOSPITAL_COMMUNITY)
Admission: EM | Admit: 2017-12-15 | Discharge: 2017-12-15 | Disposition: A | Discharge status: Home / Self Care | Payer: Medicare Other | Attending: Emergency Medicine | Admitting: Emergency Medicine

## 2017-12-15 ENCOUNTER — Encounter: Payer: Self-pay | Admitting: *Deleted

## 2017-12-15 DIAGNOSIS — Z87891 Personal history of nicotine dependence: Secondary | ICD-10-CM | POA: Diagnosis not present

## 2017-12-15 DIAGNOSIS — J069 Acute upper respiratory infection, unspecified: Secondary | ICD-10-CM | POA: Insufficient documentation

## 2017-12-15 DIAGNOSIS — R05 Cough: Secondary | ICD-10-CM | POA: Diagnosis not present

## 2017-12-15 DIAGNOSIS — Z853 Personal history of malignant neoplasm of breast: Secondary | ICD-10-CM | POA: Diagnosis not present

## 2017-12-15 DIAGNOSIS — Z79899 Other long term (current) drug therapy: Secondary | ICD-10-CM | POA: Insufficient documentation

## 2017-12-15 DIAGNOSIS — J441 Chronic obstructive pulmonary disease with (acute) exacerbation: Secondary | ICD-10-CM | POA: Diagnosis not present

## 2017-12-15 DIAGNOSIS — R0602 Shortness of breath: Secondary | ICD-10-CM | POA: Diagnosis present

## 2017-12-15 LAB — CBC WITH DIFFERENTIAL/PLATELET
BASOS PCT: 0 %
Basophils Absolute: 0 10*3/uL (ref 0.0–0.1)
Eosinophils Absolute: 0 10*3/uL (ref 0.0–0.7)
Eosinophils Relative: 0 %
HEMATOCRIT: 39.1 % (ref 36.0–46.0)
Hemoglobin: 12.4 g/dL (ref 12.0–15.0)
LYMPHS PCT: 7 %
Lymphs Abs: 0.5 10*3/uL — ABNORMAL LOW (ref 0.7–4.0)
MCH: 25.9 pg — ABNORMAL LOW (ref 26.0–34.0)
MCHC: 31.7 g/dL (ref 30.0–36.0)
MCV: 81.6 fL (ref 78.0–100.0)
MONO ABS: 0.2 10*3/uL (ref 0.1–1.0)
Monocytes Relative: 2 %
NEUTROS ABS: 6.7 10*3/uL (ref 1.7–7.7)
Neutrophils Relative %: 91 %
Platelets: 261 10*3/uL (ref 150–400)
RBC: 4.79 MIL/uL (ref 3.87–5.11)
RDW: 15.7 % — AB (ref 11.5–15.5)
WBC: 7.5 10*3/uL (ref 4.0–10.5)

## 2017-12-15 LAB — I-STAT CG4 LACTIC ACID, ED
Lactic Acid, Venous: 2.21 mmol/L (ref 0.5–1.9)
Lactic Acid, Venous: 2.72 mmol/L (ref 0.5–1.9)

## 2017-12-15 LAB — INFLUENZA PANEL BY PCR (TYPE A & B)
INFLAPCR: NEGATIVE
INFLBPCR: NEGATIVE

## 2017-12-15 LAB — COMPREHENSIVE METABOLIC PANEL
ALK PHOS: 62 U/L (ref 38–126)
ALT: 16 U/L (ref 14–54)
ANION GAP: 8 (ref 5–15)
AST: 25 U/L (ref 15–41)
Albumin: 3.5 g/dL (ref 3.5–5.0)
BUN: 11 mg/dL (ref 6–20)
CALCIUM: 8.9 mg/dL (ref 8.9–10.3)
CO2: 26 mmol/L (ref 22–32)
Chloride: 105 mmol/L (ref 101–111)
Creatinine, Ser: 0.97 mg/dL (ref 0.44–1.00)
GFR calc Af Amer: >60 mL/min (ref >60)
GFR calc non Af Amer: >60 mL/min (ref >60)
GLUCOSE: 125 mg/dL — AB (ref 65–99)
Potassium: 4.1 mmol/L (ref 3.5–5.1)
Sodium: 139 mmol/L (ref 135–145)
Total Bilirubin: 0.6 mg/dL (ref 0.3–1.2)
Total Protein: 6.7 g/dL (ref 6.5–8.1)

## 2017-12-15 LAB — I-STAT TROPONIN, ED: Troponin i, poc: 0 ng/mL (ref 0.00–0.08)

## 2017-12-15 LAB — BRAIN NATRIURETIC PEPTIDE: B Natriuretic Peptide: 31.5 pg/mL (ref 0.0–100.0)

## 2017-12-15 MED ORDER — METHYLPREDNISOLONE SODIUM SUCC 125 MG IJ SOLR
125.0000 mg | Freq: Once | INTRAMUSCULAR | Status: AC
  Administered 2017-12-15: 125 mg via INTRAVENOUS
  Filled 2017-12-15: qty 2

## 2017-12-15 MED ORDER — ACETAMINOPHEN 500 MG PO TABS
1000.0000 mg | ORAL_TABLET | Freq: Once | ORAL | Status: AC
  Administered 2017-12-15: 1000 mg via ORAL
  Filled 2017-12-15: qty 2

## 2017-12-15 MED ORDER — IPRATROPIUM-ALBUTEROL 0.5-2.5 (3) MG/3ML IN SOLN
3.0000 mL | Freq: Once | RESPIRATORY_TRACT | Status: AC
  Administered 2017-12-15: 3 mL via RESPIRATORY_TRACT
  Filled 2017-12-15: qty 3

## 2017-12-15 MED ORDER — IPRATROPIUM-ALBUTEROL 0.5-2.5 (3) MG/3ML IN SOLN
RESPIRATORY_TRACT | Status: AC
  Filled 2017-12-15: qty 3

## 2017-12-15 MED ORDER — ALBUTEROL SULFATE (2.5 MG/3ML) 0.083% IN NEBU
5.0000 mg | INHALATION_SOLUTION | Freq: Once | RESPIRATORY_TRACT | Status: AC
  Administered 2017-12-15: 5 mg via RESPIRATORY_TRACT

## 2017-12-15 MED ORDER — IPRATROPIUM-ALBUTEROL 0.5-2.5 (3) MG/3ML IN SOLN
3.0000 mL | Freq: Once | RESPIRATORY_TRACT | Status: AC
  Administered 2017-12-15: 3 mL via RESPIRATORY_TRACT

## 2017-12-15 MED ORDER — HYDROCODONE-HOMATROPINE 5-1.5 MG/5ML PO SYRP: 5.0000 mL | ORAL_SOLUTION | Freq: Four times a day (QID) | ORAL | 0 refills | Status: DC | PRN

## 2017-12-15 MED ORDER — SODIUM CHLORIDE 0.9 % IV BOLUS (SEPSIS)
1000.0000 mL | Freq: Once | INTRAVENOUS | Status: AC
  Administered 2017-12-15: 1000 mL via INTRAVENOUS

## 2017-12-15 MED ORDER — HYDROCODONE-HOMATROPINE 5-1.5 MG/5ML PO SYRP
10.0000 mL | ORAL_SOLUTION | Freq: Once | ORAL | Status: AC
Start: 2017-12-15 — End: 2017-12-15
  Administered 2017-12-15: 10 mL via ORAL
  Filled 2017-12-15: qty 10

## 2017-12-15 MED ORDER — SODIUM CHLORIDE 0.9 % IV BOLUS
500.0000 mL | Freq: Once | INTRAVENOUS | Status: AC
  Administered 2017-12-15: 500 mL via INTRAVENOUS

## 2017-12-15 MED ORDER — ALBUTEROL SULFATE (2.5 MG/3ML) 0.083% IN NEBU
INHALATION_SOLUTION | RESPIRATORY_TRACT | Status: AC
  Filled 2017-12-15: qty 6

## 2017-12-15 MED ORDER — BENZONATATE 100 MG PO CAPS: 100.0000 mg | ORAL_CAPSULE | Freq: Three times a day (TID) | ORAL | 0 refills | Status: DC

## 2017-12-15 MED ORDER — GUAIFENESIN 100 MG/5ML PO SOLN: 5.0000 mL | ORAL | 0 refills | Status: DC | PRN

## 2017-12-15 MED ORDER — SODIUM CHLORIDE 0.9 % IV SOLN
1000.0000 mL | INTRAVENOUS | Status: DC
  Administered 2017-12-15: 1000 mL via INTRAVENOUS

## 2017-12-15 NOTE — ED Provider Notes (Signed)
[facility] EMERGENCY DEPARTMENT Provider Note   CSN: [id number] Arrival date & time: 12/15/17  1333     History   Chief Complaint Chief Complaint  Patient presents with  . Shortness of Breath    HPI Mary Jenkins is a 62 y.o. female.  Patient presents to ED with complaint of shortness of breath. Patient with history of COPD. Recent URI symptoms with nasal congestion, cough, yellow sputum production. Patient in contact with pulmonology office on 12/12/17, started on prednisone taper and doxycycline. Patient continues to have cough, increased shortness of breath with activity.  The history is provided by the patient and medical records. No language interpreter was used.  Shortness of Breath  This is a recurrent problem. The average episode lasts 5 days. The problem occurs continuously.The current episode started more than 2 days ago. The problem has been gradually worsening. Associated symptoms include rhinorrhea, cough and sputum production. Pertinent negatives include no fever. She has tried oral steroids for the symptoms. The treatment provided no relief. She has had prior hospitalizations. Associated medical issues include COPD.    Past Medical History:  Diagnosis Date  . Breast cancer ([place])   . Cataract, immature    bilateral  . COPD (chronic obstructive pulmonary disease) ([place])    no home O2  . Cough 02/09/2015  . DDD (degenerative disc disease), cervical   . Exertional shortness of breath    states if she "takes her time" doing activities does not get SOB  . Family history of adverse reaction to anesthesia    pt's sister has hx. of post-op N/V  . Full dentures   . GERD (gastroesophageal reflux disease) 02/03/2012  . High cholesterol    no current med.  Marland Kitchen History of breast cancer   . Personal history of chemotherapy 1997  . Runny nose 02/09/2015   clear drainage, per pt.    Patient Active Problem List   Diagnosis Date Noted  . Weight loss  08/02/2017  . COPD with acute exacerbation ([place]) 06/16/2017  . Severe episode of recurrent major depressive disorder, without psychotic features ([place]) 04/25/2017  . Encounter for HCV screening test for low risk patient 03/10/2017  . History of hyperkalemia 02/13/2016  . Skin lesion of back 11/15/2014  . Atypical chest pain 10/21/2014  . Cataract 10/21/2014  . Dyslipidemia, goal to be determined 04/22/2014  . Lipoma 10/07/2012  . Cervical spine arthritis 08/27/2012  . History of breast cancer 07/23/2012  . GERD (gastroesophageal reflux disease) 02/03/2012  . COPD GOLD IV 06/24/2011  . Liver cyst 06/24/2011  . Insomnia 06/24/2011    Past Surgical History:  Procedure Laterality Date  . BREAST CAPSULOTOMY WITH IMPLANT EXCHANGE Right 06/21/2013   Procedure: REVISION RIGHT BREAST RECONSTRUCTION/REMOVAL OF RIGHT IMPLANT/RIGHT BREAST CAPSULOTOMY WITH INSERT TISSUE EXPLANDER RIGHT BREAST/POSSIBLE LEFT BREAST MASTOPEXY;  Surgeon: Cristine Polio, MD;  Location: [facility];  Service: Plastics;  Laterality: Right;  . BREAST RECONSTRUCTION Right 06/21/2013   Procedure: BREAST RECONSTRUCTION;  Surgeon: Cristine Polio, MD;  Location: [facility];  Service: Plastics;  Laterality: Right;  . BREAST RECONSTRUCTION Left 11/29/2013   Procedure: LEFT MASTOPEXY FOR RECONSTRUCTION;  Surgeon: Cristine Polio, MD;  Location: [facility];  Service: Plastics;  Laterality: Left;  . BREAST RECONSTRUCTION Right 03/14/2014   Procedure: RECONSTRUCTION NIPPLE RIGHT BREAST;  Surgeon: Cristine Polio, MD;  Location: [facility];  Service: Plastics;  Laterality: Right;  . BREAST SURGERY  97   implant  .  CAPSULOTOMY Right 06/30/2017   Procedure: CAPSULOTOMY;  Surgeon: Cristine Polio, MD;  Location: [facility];  Service: Plastics;  Laterality: Right;  . COLONOSCOPY    . MASTECTOMY, PARTIAL Right 1997   -node dissection  . REDUCTION MAMMAPLASTY  Left   . REMOVAL OF TISSUE EXPANDER AND PLACEMENT OF IMPLANT Right 02/13/2015   Procedure: REMOVAL OF TISSUE  EXPANDER  PORT RIGHT BREAST;  Surgeon: Cristine Polio, MD;  Location: [facility];  Service: Plastics;  Laterality: Right;  . REMOVAL OF TISSUE EXPANDER AND PLACEMENT OF IMPLANT Right 06/30/2017   Procedure: REMOVAL OF RIGHT TISSUE EXPANDER WITH PLACEMENT OF RIGHT GELL BREAST IMPLANTS;  Surgeon: Cristine Polio, MD;  Location: [facility];  Service: Plastics;  Laterality: Right;  . SCAR REVISION Right 02/13/2015   Procedure: SCAR REVISION RIGHT BREAST;  Surgeon: Cristine Polio, MD;  Location: [facility];  Service: Plastics;  Laterality: Right;  . TISSUE EXPANDER PLACEMENT Right 03/14/2014   Procedure: SALINE REMOVAL RIGHT TISSUE EXPANDER;  Surgeon: Cristine Polio, MD;  Location: [facility];  Service: Plastics;  Laterality: Right;  . TUBAL LIGATION  1980's     OB History   None      Home Medications    Prior to Admission medications   Medication Sig Start Date End Date Taking? Authorizing Provider  albuterol (PROVENTIL) (2.5 MG/3ML) 0.083% nebulizer solution Take 3 mLs (2.5 mg total) by nebulization every 6 (six) hours as needed for wheezing. For wheezing 07/26/15   Riccardo Dubin, MD  albuterol (VENTOLIN HFA) 108 (90 Base) MCG/ACT inhaler INHALE 2 PUFFS BY MOUTH EVERY 6 HOURS AS NEEDED FOR WHEEZING 08/01/17   Chundi, Verne Spurr, MD  budesonide-formoterol (SYMBICORT) 160-4.5 MCG/ACT inhaler Inhale 2 puffs into the lungs 2 (two) times daily. 04/18/17   Axel Filler, MD  doxycycline (VIBRA-TABS) 100 MG tablet Take 1 tablet (100 mg total) by mouth 2 (two) times daily. 12/12/17   Brand Males, MD  omeprazole (PRILOSEC) 20 MG capsule Take 1 capsule (20 mg total) by mouth daily as needed. 06/23/17   Lucious Groves, DO  predniSONE (DELTASONE) 10 MG tablet Take  4 each am x 2 days,   2 each am x 2 days,  1 each am x 2  days and stop 11/26/17   Tanda Rockers, MD  predniSONE (DELTASONE) 10 MG tablet 4 tabs x 2 days, 3 tabs x 2 days, 2 tabs x 2 days, 1 tab x 2 days then stop. 12/12/17   Brand Males, MD  RESTASIS 0.05 % ophthalmic emulsion Place into both eyes 2 (two) times daily. 11/09/14   [provider]  Tiotropium Bromide Monohydrate (SPIRIVA RESPIMAT) 2.5 MCG/ACT AERS INHALE TWO PUFFS BY MOUTH ONCE DAILY IN THE MORNING 11/26/17   Tanda Rockers, MD    Family History Family History  Problem Relation Age of Onset  . Hypertension Mother   . Hypertension Sister   . Anesthesia problems Sister        post-op N/V  . Heart disease Brother     Social History Social History   Tobacco Use  . Smoking status: Former Smoker    Last attempt to quit: 07/23/2012    Years since quitting: 5.4  . Smokeless tobacco: Never Used  Substance Use Topics  . Alcohol use: No    Alcohol/week: 0.0 oz  . Drug use: No     Allergies   Patient has no known allergies.   Review of  Systems Review of Systems  Constitutional: Negative for fever.  HENT: Positive for congestion and rhinorrhea.   Respiratory: Positive for cough, sputum production and shortness of breath.   All other systems reviewed and are negative.    Physical Exam Updated Vital Signs BP 101/70   Pulse 93   Temp 98.4 F (36.9 C)   Resp 17   SpO2 97%   Physical Exam  Constitutional: She is oriented to person, place, and time. She appears well-developed and well-nourished.  HENT:  Head: Normocephalic.  Mouth/Throat: Oropharynx is clear and moist.  Neck: Neck supple.  Cardiovascular: Normal rate.  Pulmonary/Chest: Effort normal. No respiratory distress. She has decreased breath sounds. She has no wheezes. She has no rhonchi. She has no rales.  Abdominal: Soft. Bowel sounds are normal.  Musculoskeletal: Normal range of motion.       Right lower leg: Normal.       Left lower leg: Normal.  Neurological: She is alert and oriented to  person, place, and time.  Skin: Skin is warm and dry.  Psychiatric: She has a normal mood and affect.  Nursing note and vitals reviewed.    ED Treatments / Results  Labs (all labs ordered are listed, but only abnormal results are displayed) Labs Reviewed  COMPREHENSIVE METABOLIC PANEL - Abnormal; Notable for the following components:      Result Value   Glucose, Bld 125 (*)    All other components within normal limits  CBC WITH DIFFERENTIAL/PLATELET - Abnormal; Notable for the following components:   MCH 25.9 (*)    RDW 15.7 (*)    Lymphs Abs 0.5 (*)    All other components within normal limits  I-STAT CG4 LACTIC ACID, ED - Abnormal; Notable for the following components:   Lactic Acid, Venous 2.21 (*)    All other components within normal limits  I-STAT CG4 LACTIC ACID, ED    EKG None  Radiology Dg Chest 2 View  Result Date: 12/15/2017 CLINICAL DATA:  Cough, shortness of breath, COPD, breast cancer EXAM: CHEST - 2 VIEW COMPARISON:  06/16/2017 FINDINGS: Normal heart size, mediastinal contours, and pulmonary vascularity. Emphysematous and minimal bronchitic changes consistent with COPD. No acute infiltrate, pleural effusion or pneumothorax. Bones demineralized. Prior RIGHT mastectomy and axillary node dissection with RIGHT breast prosthesis noted. IMPRESSION: COPD changes without acute infiltrate. Electronically Signed   By: Lavonia Dana M.D.   On: 12/15/2017 15:26    Procedures Procedures (including critical care time)  Medications Ordered in ED Medications  albuterol (PROVENTIL) (2.5 MG/3ML) 0.083% nebulizer solution 5 mg (5 mg Nebulization Given 12/15/17 1349)  ipratropium-albuterol (DUONEB) 0.5-2.5 (3) MG/3ML nebulizer solution 3 mL (3 mLs Nebulization Given 12/15/17 1349)  sodium chloride 0.9 % bolus 500 mL (500 mLs Intravenous Given 12/15/17 1927)  sodium chloride 0.9 % bolus 1,000 mL (0 mLs Intravenous Stopped 12/15/17 2002)  HYDROcodone-homatropine (HYCODAN) 5-1.5 MG/5ML  syrup 10 mL (10 mLs Oral Given 12/15/17 2002)  ipratropium-albuterol (DUONEB) 0.5-2.5 (3) MG/3ML nebulizer solution 3 mL (3 mLs Nebulization Given 12/15/17 1922)  acetaminophen (TYLENOL) tablet 1,000 mg (1,000 mg Oral Given 12/15/17 1922)  ipratropium-albuterol (DUONEB) 0.5-2.5 (3) MG/3ML nebulizer solution 3 mL (3 mLs Nebulization Given 12/15/17 2029)  methylPREDNISolone sodium succinate (SOLU-MEDROL) 125 mg/2 mL injection 125 mg (125 mg Intravenous Given 12/15/17 2029)     Initial Impression / Assessment and Plan / ED Course  I have reviewed the triage vital signs and the nursing notes.  Pertinent labs & imaging results that were  available during my care of the patient were reviewed by me and considered in my medical decision making (see chart for details).     Patient discussed with and seen by Dr. Johnney Killian.  Pt symptoms consistent with URI and COPD exacerbation. CXR negative for acute infiltrate. Patient improved after IV fluids and medications. Pt will be discharged with symptomatic treatment.  Discussed return precautions.  Pt is hemodynamically stable & in NAD prior to discharge.  Final Clinical Impressions(s) / ED Diagnoses   Final diagnoses:  Viral upper respiratory tract infection  COPD exacerbation [facility])    ED Discharge Orders        Ordered    HYDROcodone-homatropine (HYCODAN) 5-1.5 MG/5ML syrup  Every 6 hours PRN     12/15/17 2139    benzonatate (TESSALON) 100 MG capsule  Every 8 hours     12/15/17 2139    guaiFENesin (ROBITUSSIN) 100 MG/5ML SOLN  Every 4 hours PRN     12/15/17 2139       Etta Quill, NP 12/16/17 1610    Charlesetta Shanks, MD 12/28/17 1521

## 2017-12-15 NOTE — ED Notes (Signed)
ED Provider at bedside. 

## 2017-12-15 NOTE — ED Notes (Signed)
Patient verbalizes understanding of discharge instructions. Opportunity for questioning and answers were provided. Armband removed by staff, pt discharged from ED via wheelchair.  

## 2017-12-15 NOTE — ED Notes (Signed)
Pt antsy, requesting update from physician, ready to go home

## 2017-12-15 NOTE — ED Provider Notes (Signed)
[place] EMERGENCY DEPARTMENT Provider Note   CSN: [id number] Arrival date & time: 12/15/17  1333     History   Chief Complaint Chief Complaint  Patient presents with  . Shortness of Breath    HPI Mary Jenkins is a 62 y.o. female.  HPI  Past Medical History:  Diagnosis Date  . Breast cancer ([place])   . Cataract, immature    bilateral  . COPD (chronic obstructive pulmonary disease) ([place])    no home O2  . Cough 02/09/2015  . DDD (degenerative disc disease), cervical   . Exertional shortness of breath    states if she "takes her time" doing activities does not get SOB  . Family history of adverse reaction to anesthesia    pt's sister has hx. of post-op N/V  . Full dentures   . GERD (gastroesophageal reflux disease) 02/03/2012  . High cholesterol    no current med.  Marland Kitchen History of breast cancer   . Personal history of chemotherapy 1997  . Runny nose 02/09/2015   clear drainage, per pt.    Patient Active Problem List   Diagnosis Date Noted  . Weight loss 08/02/2017  . COPD with acute exacerbation ([place]) 06/16/2017  . Severe episode of recurrent major depressive disorder, without psychotic features ([place]) 04/25/2017  . Encounter for HCV screening test for low risk patient 03/10/2017  . History of hyperkalemia 02/13/2016  . Skin lesion of back 11/15/2014  . Atypical chest pain 10/21/2014  . Cataract 10/21/2014  . Dyslipidemia, goal to be determined 04/22/2014  . Lipoma 10/07/2012  . Cervical spine arthritis 08/27/2012  . History of breast cancer 07/23/2012  . GERD (gastroesophageal reflux disease) 02/03/2012  . COPD GOLD IV 06/24/2011  . Liver cyst 06/24/2011  . Insomnia 06/24/2011    Past Surgical History:  Procedure Laterality Date  . BREAST CAPSULOTOMY WITH IMPLANT EXCHANGE Right 06/21/2013   Procedure: REVISION RIGHT BREAST RECONSTRUCTION/REMOVAL OF RIGHT IMPLANT/RIGHT BREAST CAPSULOTOMY WITH INSERT TISSUE EXPLANDER RIGHT BREAST/POSSIBLE  LEFT BREAST MASTOPEXY;  Surgeon: Cristine Polio, MD;  Location: [place];  Service: Plastics;  Laterality: Right;  . BREAST RECONSTRUCTION Right 06/21/2013   Procedure: BREAST RECONSTRUCTION;  Surgeon: Cristine Polio, MD;  Location: [place];  Service: Plastics;  Laterality: Right;  . BREAST RECONSTRUCTION Left 11/29/2013   Procedure: LEFT MASTOPEXY FOR RECONSTRUCTION;  Surgeon: Cristine Polio, MD;  Location: [place];  Service: Plastics;  Laterality: Left;  . BREAST RECONSTRUCTION Right 03/14/2014   Procedure: RECONSTRUCTION NIPPLE RIGHT BREAST;  Surgeon: Cristine Polio, MD;  Location: [place];  Service: Plastics;  Laterality: Right;  . BREAST SURGERY  97   implant  . CAPSULOTOMY Right 06/30/2017   Procedure: CAPSULOTOMY;  Surgeon: Cristine Polio, MD;  Location: [place];  Service: Plastics;  Laterality: Right;  . COLONOSCOPY    . MASTECTOMY, PARTIAL Right 1997   -node dissection  . REDUCTION MAMMAPLASTY Left   . REMOVAL OF TISSUE EXPANDER AND PLACEMENT OF IMPLANT Right 02/13/2015   Procedure: REMOVAL OF TISSUE  EXPANDER  PORT RIGHT BREAST;  Surgeon: Cristine Polio, MD;  Location: [place];  Service: Plastics;  Laterality: Right;  . REMOVAL OF TISSUE EXPANDER AND PLACEMENT OF IMPLANT Right 06/30/2017   Procedure: REMOVAL OF RIGHT TISSUE EXPANDER WITH PLACEMENT OF RIGHT GELL BREAST IMPLANTS;  Surgeon: Cristine Polio, MD;  Location: [place];  Service: Plastics;  Laterality: Right;  . SCAR REVISION Right  02/13/2015   Procedure: SCAR REVISION RIGHT BREAST;  Surgeon: Cristine Polio, MD;  Location: [place];  Service: Plastics;  Laterality: Right;  . TISSUE EXPANDER PLACEMENT Right 03/14/2014   Procedure: SALINE REMOVAL RIGHT TISSUE EXPANDER;  Surgeon: Cristine Polio, MD;  Location: [place];  Service: Plastics;  Laterality: Right;  .  TUBAL LIGATION  1980's     OB History   None      Home Medications    Prior to Admission medications   Medication Sig Start Date End Date Taking? Authorizing Provider  albuterol (PROVENTIL) (2.5 MG/3ML) 0.083% nebulizer solution Take 3 mLs (2.5 mg total) by nebulization every 6 (six) hours as needed for wheezing. For wheezing 07/26/15   Riccardo Dubin, MD  albuterol (VENTOLIN HFA) 108 (90 Base) MCG/ACT inhaler INHALE 2 PUFFS BY MOUTH EVERY 6 HOURS AS NEEDED FOR WHEEZING 08/01/17   Chundi, Verne Spurr, MD  budesonide-formoterol (SYMBICORT) 160-4.5 MCG/ACT inhaler Inhale 2 puffs into the lungs 2 (two) times daily. 04/18/17   Axel Filler, MD  doxycycline (VIBRA-TABS) 100 MG tablet Take 1 tablet (100 mg total) by mouth 2 (two) times daily. 12/12/17   Brand Males, MD  omeprazole (PRILOSEC) 20 MG capsule Take 1 capsule (20 mg total) by mouth daily as needed. 06/23/17   Lucious Groves, DO  predniSONE (DELTASONE) 10 MG tablet Take  4 each am x 2 days,   2 each am x 2 days,  1 each am x 2 days and stop 11/26/17   Tanda Rockers, MD  predniSONE (DELTASONE) 10 MG tablet 4 tabs x 2 days, 3 tabs x 2 days, 2 tabs x 2 days, 1 tab x 2 days then stop. 12/12/17   Brand Males, MD  RESTASIS 0.05 % ophthalmic emulsion Place into both eyes 2 (two) times daily. 11/09/14   [provider]  Tiotropium Bromide Monohydrate (SPIRIVA RESPIMAT) 2.5 MCG/ACT AERS INHALE TWO PUFFS BY MOUTH ONCE DAILY IN THE MORNING 11/26/17   Tanda Rockers, MD    Family History Family History  Problem Relation Age of Onset  . Hypertension Mother   . Hypertension Sister   . Anesthesia problems Sister        post-op N/V  . Heart disease Brother     Social History Social History   Tobacco Use  . Smoking status: Former Smoker    Last attempt to quit: 07/23/2012    Years since quitting: 5.4  . Smokeless tobacco: Never Used  Substance Use Topics  . Alcohol use: No    Alcohol/week: 0.0 oz  . Drug use: No      Allergies   Patient has no known allergies.   Review of Systems Review of Systems   Physical Exam Updated Vital Signs BP 101/70   Pulse 93   Temp 98.4 F (36.9 C)   Resp 17   SpO2 97%   Physical Exam   ED Treatments / Results  Labs (all labs ordered are listed, but only abnormal results are displayed) Labs Reviewed  COMPREHENSIVE METABOLIC PANEL - Abnormal; Notable for the following components:      Result Value   Glucose, Bld 125 (*)    All other components within normal limits  CBC WITH DIFFERENTIAL/PLATELET - Abnormal; Notable for the following components:   MCH 25.9 (*)    RDW 15.7 (*)    Lymphs Abs 0.5 (*)    All other components within normal limits  I-STAT CG4 LACTIC ACID, ED -  Abnormal; Notable for the following components:   Lactic Acid, Venous 2.21 (*)    All other components within normal limits  BRAIN NATRIURETIC PEPTIDE  I-STAT CG4 LACTIC ACID, ED  I-STAT TROPONIN, ED    EKG EKG Interpretation  Date/Time:  Monday December 15 2017 13:56:07 EDT Ventricular Rate:  89 PR Interval:  114 QRS Duration: 70 QT Interval:  360 QTC Calculation: 438 R Axis:   91 Text Interpretation:  Normal sinus rhythm Rightward axis Borderline ECG no change from previous Confirmed by Charlesetta Shanks [phone]) on 12/15/2017 5:04:33 PM   Radiology Dg Chest 2 View  Result Date: 12/15/2017 CLINICAL DATA:  Cough, shortness of breath, COPD, breast cancer EXAM: CHEST - 2 VIEW COMPARISON:  06/16/2017 FINDINGS: Normal heart size, mediastinal contours, and pulmonary vascularity. Emphysematous and minimal bronchitic changes consistent with COPD. No acute infiltrate, pleural effusion or pneumothorax. Bones demineralized. Prior RIGHT mastectomy and axillary node dissection with RIGHT breast prosthesis noted. IMPRESSION: COPD changes without acute infiltrate. Electronically Signed   By: Lavonia Dana M.D.   On: 12/15/2017 15:26    Procedures Procedures (including critical care  time)  Medications Ordered in ED Medications  albuterol (PROVENTIL) (2.5 MG/3ML) 0.083% nebulizer solution 5 mg (5 mg Nebulization Given 12/15/17 1349)  ipratropium-albuterol (DUONEB) 0.5-2.5 (3) MG/3ML nebulizer solution 3 mL (3 mLs Nebulization Given 12/15/17 1349)     Initial Impression / Assessment and Plan / ED Course  I have reviewed the triage vital signs and the nursing notes.  Pertinent labs & imaging results that were available during my care of the patient were reviewed by me and considered in my medical decision making (see chart for details).     Patient discussed with and seen by Dr. Johnney Killian.  Lactate mildly elevated. No current concern for sepsis (Afebrile, not tachycardic or hypotensive). Chest films reveals COPD changes without infiltrate. Patient given fluid bolus in ED. Patient feels better after IV fluids and medications. Dyspnea improved. Patient appears stable for discharge. Patient has adequate inhaler medications at home. Continue with prednisone taper. Follow-up with PCP and pulmonology. Care instructions and return precautions discussed.  .  Final Clinical Impressions(s) / ED Diagnoses   Final diagnoses:  Viral upper respiratory tract infection  SOB (shortness of breath)    ED Discharge Orders        Ordered    HYDROcodone-homatropine (HYCODAN) 5-1.5 MG/5ML syrup  Every 6 hours PRN     12/15/17 2139    benzonatate (TESSALON) 100 MG capsule  Every 8 hours     12/15/17 2139    guaiFENesin (ROBITUSSIN) 100 MG/5ML SOLN  Every 4 hours PRN     12/15/17 2139       Etta Quill, NP 12/15/17 2154    Charlesetta Shanks, MD 12/27/17 [phone]

## 2017-12-15 NOTE — ED Provider Notes (Signed)
Medical screening examination/treatment/procedure(s) were conducted as a shared visit with non-physician practitioner(s) and myself.  I personally evaluated the patient during the encounter.  EKG Interpretation  Date/Time:  Monday December 15 2017 13:56:07 EDT Ventricular Rate:  89 PR Interval:  114 QRS Duration: 70 QT Interval:  360 QTC Calculation: 438 R Axis:   91 Text Interpretation:  Normal sinus rhythm Rightward axis Borderline ECG no change from previous Confirmed by Charlesetta Shanks [phone]) on 12/15/2017 5:04:33 PM Patient reports that she had a cough for about a week.  She reports that is been nonproductive and tight.  She gets a feeling very short of breath with coughing.  Patient reports she is also had a lot of sinus congestion and nasal congestion.  She does endorse body aches but denies fever.  Reports that she tries to get up and move around she feels very short of breath.  Patient has history of COPD.  She reports she always gets symptoms at season changes.  She started prednisone and antibiotics 2 days ago without improvement.  No lower extremity swelling or calf pain.  No chest pain.  Patient is alert and nontoxic.  No respiratory distress at rest.  Posterior oral airway widely patent.  No erythema or exudate.  Bilateral breath sounds are soft at the bases.  No gross wheeze or rhonchi.  Heart is regular no rub murmur gallop.  Abdomen is soft and nontender.  No peripheral edema.  Lower extremities are in good condition.  No calf tenderness.  Skin normal.  Clinically, patient appears stable.  Chest x-ray does not show focal pneumonia.  Proceed with influenza testing and continue symptomatic treatment.  Will reassess for admission versus discharge of COPD exacerbation once treatment and evaluation complete.   Charlesetta Shanks, MD 12/15/17 [phone]

## 2017-12-15 NOTE — ED Triage Notes (Signed)
Pt to ER for evaluation of shortness of breath x1 week, saw PCP, was given antibiotics, steroids, no relief. Distressed in triage, a/o x4. Breath sounds diminished throughout. VSS.

## 2017-12-15 NOTE — ED Provider Notes (Signed)
Patient placed in Quick Look pathway, seen and evaluated   Chief Complaint: shortness of breath  HPI:   62 y.o. female presents to the ED for shortness of breath that started a week ago and has gotten progressively worse. She went to her PCP and was started on antibiotics and steroids without relief.   ROS: Resp: shortness of breath  Physical Exam:  BP 124/78 (BP Location: Right Arm)   Pulse (!) 105   Temp 97.7 F (36.5 C) (Oral)   Resp (!) 25   SpO2 100%    Gen: patient bent forward breathing rapidly and and appears uncomfortable.   Neuro: Awake and Alert  Skin: Warm and dry  Resp: decreased BS bilateral   Nebs started  Focused Exam:    Initiation of care has begun. The patient has been counseled on the process, plan, and necessity for staying for the completion/evaluation, and the remainder of the medical screening examination    Ashley Murrain, NP 12/15/17 1411    Noemi Chapel, MD 12/15/17 2137

## 2017-12-15 NOTE — ED Notes (Signed)
Lab results reported to Nurse Thunderbolt R.

## 2017-12-16 ENCOUNTER — Telehealth: Payer: Self-pay | Admitting: Internal Medicine

## 2017-12-16 ENCOUNTER — Other Ambulatory Visit: Payer: Self-pay | Admitting: Student in an Organized Health Care Education/Training Program

## 2017-12-16 DIAGNOSIS — J449 Chronic obstructive pulmonary disease, unspecified: Secondary | ICD-10-CM

## 2017-12-16 MED ORDER — ACETAMINOPHEN-CODEINE #3 300-30 MG PO TABS: 1.0000 | ORAL_TABLET | ORAL | 0 refills | Status: DC | PRN

## 2017-12-16 NOTE — Telephone Encounter (Signed)
Spoke with pt. States that she was seen in ED yesterday. Her daughter took her and didn't give her a chance to call us before going. The ED gave her Tessalon, Hycodan and Robitussin. Pt could only afford to get the Robitussin. She would like alternatives to [place].  MW - please advise. Thanks.  **She has a pending OV with MW on 12/18/17.**

## 2017-12-16 NOTE — Telephone Encounter (Signed)
Called pt and advised message from the provider. Pt understood and verbalized understanding. Nothing further is needed.   Rx called in.

## 2017-12-16 NOTE — Telephone Encounter (Signed)
Best option is tylenol #3 one every 4 hours as needed # 28  No refills

## 2017-12-18 ENCOUNTER — Ambulatory Visit (INDEPENDENT_AMBULATORY_CARE_PROVIDER_SITE_OTHER): Payer: Medicare Other | Admitting: Internal Medicine

## 2017-12-18 ENCOUNTER — Encounter: Payer: Self-pay | Admitting: Internal Medicine

## 2017-12-18 VITALS — BP 116/78 | HR 78 | Ht 66.0 in | Wt 99.2 lb

## 2017-12-18 DIAGNOSIS — J441 Chronic obstructive pulmonary disease with (acute) exacerbation: Secondary | ICD-10-CM

## 2017-12-18 MED ORDER — FLUTTER DEVI: 0 refills | Status: DC

## 2017-12-18 NOTE — Patient Instructions (Addendum)
Work on inhaler technique:  relax and gently blow all the way out then take a nice smooth deep breath back in, triggering the inhaler at same time you start breathing in.  Hold for up to 5 seconds if you can. Blow out thru nose. Rinse and gargle with water when done   Finish prednisone as you  plan   For cough > mucinex dm up to 1200 mg daily and use flutter valve as much as possible   Whenever cough flares should be on priloec 20 mg Take 30- 60 min before your first and last meals of the day then ok when cough better for a week to leave the prilosec back off    Please schedule a follow up office visit in 4 weeks, sooner if needed  with all medications /inhalers/ solutions in hand so we can verify exactly what you are taking. This includes all medications from all doctors and over the counters

## 2017-12-18 NOTE — Progress Notes (Signed)
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 No real variability, cough is better with no excess or purulent sputum.  Not using saba daytime because finds if she's holding still doesn't need it as oftern. Some better p last  Prednisone rx. Not much variability. rec Stop atrovent and advair Start symbiocort Take 2 puffs first thing in am and then another 2 puffs about 12 hours later.  Take after only am dose Spiriva Protonix 40 mg Take 30-60 min before first meal of the day  GERD diet Only use your albuterol (Plan B= ventolin puffer,  Plan C is nebulizer) as a rescue medication to be used if you can't catch your breath by resting or doing a relaxed purse lip breathing pattern. The less you use it, the better it will work when you need it. Ok to use rescue up to every 4 hours if doing poorly.          02/21/2017  f/u ov/Mary Jenkins re: GOLD IV  Copd/ sym/ spiriva maint rx  Chief Complaint  Patient presents with  . Follow-up    Breathing is overall doing well. She is using her albuterol inhaler 2-3 x per day on average and neb with albuterol maybe once per wk.    MMRC2 = can't walk a nl pace on a flat grade s sob but does fine slow and flat pushes the cart most times at [employer] and even sometimes at [employer]  rec No change in medications - stay as active as you can but pace yourself Plan A = Automatic = Symbicort/ spiriva daily  Plan B = Backup Only use your albuterol as a rescue medication  Plan C = Crisis -  only use your albuterol nebulizer if you first try Plan B  mucinex dm up to 1200 mg every 12 hours as needed for cough  Bring meds to next ov      11/26/2017  f/u ov/Mary Jenkins re:  GOLD IV copd sym/spriva off remeron and losing wt ? Why > w/u in progress by pcp Chief Complaint  Patient presents with  . Follow-up    Breathing is overall doing well. She states having minimal chest tightness for approx 2 wks. She is using her albuterol inhaler 2 x per day on average and neb once per wk. She has been losing some wt unintentionally. Lack of appetite.    Dyspnea:  MMRC3 = can't walk 100 yards even at a slow pace at a flat grade s stopping due to sob  /ok [employer] leaning on cart Cough: rattling/ min productive  Sleep: ok no am flare  SABA use:  Twice daily saba / neb once a week  rec Prednisone 10 mg take  4 each am x 2 days,   2 each am x 2 days,  1 each am x 2 days and stop  Work on inhaler technique:    12/12/16 phone call Doxycyline 100 mg #10 take po BID x  5days and Prednisone 40 mg x 2 days, 30 mg x 2 days, 20 mg x 2 days, 10 mg x 2 days 5 mg x 2 days no refills.     12/18/2017  f/u ov/Mary Jenkins re:  COPD IV/ symb/ spiriva tapering prd  Chief Complaint  Patient presents with  . Follow-up    SOB with activity, productive cough -yellow thick mucus, slight wheezing at night more   Dyspnea:  MMRC3 = can't walk 100 yards even at a slow pace at a flat grade s stopping due to sob  Cough: congested cough  Sleep: ok flat most noct SABA use:  Not using  In any form     No obvious day to day or daytime variability or assoc  mucus plugs or hemoptysis or cp or chest tightness, subjective wheeze or overt sinus or hb symptoms. No unusual exposure hx or h/o childhood pna/ asthma or knowledge of premature birth.  Sleeping ok flat without nocturnal  or early am exacerbation  of respiratory  c/o's or need for noct saba. Also denies any obvious fluctuation of symptoms with weather or environmental changes or  other aggravating or alleviating factors except as outlined above   Current Allergies, Complete Past Medical History, Past Surgical History, Family History, and Social History were reviewed in [employer] [employer] record.  ROS  The following are not active complaints unless bolded Hoarseness, sore throat, dysphagia, dental problems, itching, sneezing,  nasal congestion or discharge of excess mucus or purulent secretions, ear ache,   fever, chills, sweats, unintended wt loss or wt gain, classically pleuritic or exertional cp,  orthopnea pnd or leg swelling, presyncope, palpitations, abdominal pain, anorexia, nausea, vomiting, diarrhea  or change in bowel habits or change in bladder habits, change in stools or change in urine, dysuria, hematuria,  rash, arthralgias, visual complaints, headache, numbness, weakness or ataxia or problems with walking or coordination,  change in mood/affect or memory.        Current Meds  Medication Sig  . acetaminophen-codeine (TYLENOL #3) 300-30 MG tablet Take 1 tablet by mouth every 4 (four) hours as needed for moderate pain.  Marland Kitchen albuterol (PROVENTIL) (2.5 MG/3ML) 0.083% nebulizer solution Take 3 mLs (2.5 mg total) by nebulization every 6 (six) hours as needed for wheezing. For wheezing  . albuterol (VENTOLIN HFA) 108 (90 Base) MCG/ACT inhaler INHALE 2 PUFFS BY MOUTH EVERY 6 HOURS AS NEEDED FOR WHEEZING  . budesonide-formoterol (SYMBICORT) 160-4.5 MCG/ACT inhaler Inhale 2 puffs into the lungs 2 (two) times daily.  Marland Kitchen guaiFENesin (ROBITUSSIN) 100 MG/5ML SOLN Take 5 mLs (100 mg total) by mouth every 4 (four) hours as needed for cough or to loosen phlegm.  Marland Kitchen omeprazole (PRILOSEC) 20 MG capsule Take 1 capsule (20 mg total) by mouth daily as needed. (Patient taking differently: Take 20 mg by mouth at bedtime. )  . predniSONE (DELTASONE) 10 MG tablet 4 tabs x 2 days, 3 tabs x 2 days, 2 tabs x 2 days, 1 tab x 2 days then stop.  . RESTASIS 0.05 % ophthalmic  emulsion Place into both eyes 2 (two) times daily.  . Tiotropium Bromide Monohydrate (SPIRIVA RESPIMAT) 2.5 MCG/ACT AERS INHALE TWO PUFFS BY MOUTH ONCE DAILY IN THE MORNING              Objective:   Physical Exam    amb thin bf nad   Wt Readings from Last 3 Encounters:  12/18/17 99 lb 3.2 oz (45 kg)  11/26/17 99 lb 9.6  oz (45.2 kg)  11/17/17 102 lb 6.4 oz (46.4 kg)     Vital signs reviewed - Note on arrival 02 sats  93 % on RA          Wt 120 02/13/2012  > 119 03/13/2012 > 04/22/2012 117 > 07/21/2012 122> 12/02/2012  131> 03/22/2013  131 > 04/19/2013  129  > 07/19/2013 131 > 06/06/2014 129 >  11/11/2014  132> 02/28/2015   129 > 01/16/2016  115 >  02/29/2016  110 > 05/31/2016  115  > 11/21/2016   117 > 02/21/2017   107 > 06/16/2017 102 > 08/25/2017   106 > 11/26/2017 99 > 12/18/2017      Full dentures LUNGS: no acc muscle use,  Barrel contour chest wall with bilateral  Distant bs  without cough on insp or exp maneuver and hyper resonant to  percussion bilaterally      HEENT: nl  oropharynx. Nl external ear canals without cough reflex - full dentures/ mild bilateral non-specific turbinate edema     NECK :  without JVD/Nodes/TM/ nl carotid upstrokes bilaterally   LUNGS: no acc muscle use,  Mod barrel  contour chest wall with bilateral  Distant bs s audible wheeze and  without cough on insp or exp maneuver and mod   Hyperresonant  to  percussion bilaterally     CV:  RRR  no s3 or murmur or increase in P2, and no edema   ABD:  soft and nontender with pos mid insp Hoover's  in the supine position. No bruits or organomegaly appreciated, bowel sounds nl  MS:   Nl gait/  ext warm without deformities, calf tenderness, cyanosis or clubbing No obvious joint restrictions   SKIN: warm and dry without lesions    NEURO:  alert, approp, nl sensorium with  no motor or cerebellar deficits apparent.                            Assessment & Plan:

## 2017-12-21 ENCOUNTER — Encounter: Payer: Self-pay | Admitting: Internal Medicine

## 2017-12-21 NOTE — Assessment & Plan Note (Signed)
PFT performed on 06/27/2011. Poor quality.    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 prior to study with DLCO  24 % corrects to 33 % for alv volume   - 11/26/2017  After extensive coaching inhaler device  effectiveness =    75% (short Ti) - referred to rehab again 11/26/2017    12/18/2017  After extensive coaching inhaler device  effectiveness =    75% (Ti too short)  Continues to have  difficult airways management:  Ddx almost all start with A and  include Adherence, Ace Inhibitors, Acid Reflux, Active Sinus Disease, Alpha 1 Antitripsin deficiency, Anxiety masquerading as Airways dz,  ABPA,  Allergy(esp in young), Aspiration (esp in elderly), Adverse effects of meds,  Active smokers, A bunch of PE's (a small clot burden can't cause this syndrome unless there is already severe underlying pulm or vascular dz with poor reserve) plus two Bs  = Bronchiectasis and Beta blocker use..and one C= CHF  - see hfa teaching/ need to confirm at each ov - return  with all meds in hand using a trust but verify approach to confirm accurate Medication  Reconciliation The principal here is that until we are certain that the  patients are doing what we've asked, it makes no sense to ask them to do more.    ? Acid (or non-acid) GERD > always difficult to exclude as up to 75% of pts in some series report no assoc GI/ Heartburn  symptoms> rec max (24h)  acid suppression and diet restrictions/ reviewed and instructions given in writing.  - Of the three most common causes of  Sub-acute or recurrent or chronic cough, only one (GERD)  can actually contribute to/ trigger  the other two (asthma and post nasal drip syndrome)  and perpetuate the cylce of cough.  While not intuitively obvious, many patients with chronic low grade reflux do not cough until there is a primary insult that disturbs the protective epithelial barrier and exposes sensitive nerve endings.   This is typically viral but can be direct physical injury such as with an endotracheal tube.   The point is that once this occurs, it is difficult to eliminate the cycle  using anything but a maximally effective acid suppression regimen at least in the short run, accompanied by an appropriate diet to address non acid GERD and control / eliminate the cough itself for at least 3 days with mucinex dm/ flutter / reviewed  ? Allergy component > doubt > taper off pred and continue high dose ics  ? Active sinus dz > consider ct sinus if flares again.   I had an extended discussion with the patient  reviewing all relevant studies completed to date and  lasting 15 to 20 minutes of a 25 minute visit    Each maintenance medication was reviewed in detail including most importantly the difference between maintenance and prns and under what circumstances the prns are to be triggered using an action plan format that is not reflected in the computer generated alphabetically organized AVS.    Please see AVS for specific instructions unique to this visit that I personally wrote and verbalized to the the pt in detail and then reviewed with pt  by my nurse highlighting any  changes in therapy recommended at today's visit to their plan of care.

## 2017-12-22 ENCOUNTER — Other Ambulatory Visit: Payer: Self-pay | Admitting: Internal Medicine

## 2017-12-22 DIAGNOSIS — J441 Chronic obstructive pulmonary disease with (acute) exacerbation: Secondary | ICD-10-CM

## 2017-12-24 ENCOUNTER — Telehealth: Payer: Self-pay | Admitting: Internal Medicine

## 2017-12-24 MED ORDER — LEVOFLOXACIN 500 MG PO TABS: 500.0000 mg | ORAL_TABLET | Freq: Every day | ORAL | 0 refills | Status: AC

## 2017-12-24 NOTE — Telephone Encounter (Signed)
Spoke with the pt  Last seen here 3/281/9 and given the following recs:  Work on inhaler technique: relax and gently blow all the way out then take a nice smooth deep breath back in, triggering the inhaler at same time you start breathing in. Hold for up to 5 seconds if you can. Blow out thru nose. Rinse and gargle with water when done Finish prednisone as your plan For cough > mucinex dm up to 1200 mg daily and use flutter valve as much as possible Whenever cough flares should be on priloec 20 mg Take 30- 60 min before your first and last meals of the day then ok when cough better for a week to leave the prilosec back off Please schedule a follow up office visit in 4 weeks, sooner if needed with all medications /inhalers/ solutions in hand so we can verify exactly what you are taking. This includes  She was given doxy on 3/22 by MR  She has finished this and the pred and her cough is not improving She is taking the mucinex, using flutter and prilosec bid She is still coughing up yellow sputum and is requesting another abx  She denies having any increased SOB, wheezing, chest tightness, f/c/s  Please advise thanks!

## 2017-12-24 NOTE — Telephone Encounter (Signed)
Attempted to call patient, no answer, voicemail has not been set up so unable to leave message. Will try back later.

## 2017-12-24 NOTE — Telephone Encounter (Signed)
levaquin 500 mg daily x 5 days Tylenol # 3 works well for cough to supplement the mucinex dm if she has it  Ok to call in # 16 if she doesn't have it already

## 2017-12-24 NOTE — Telephone Encounter (Signed)
Called and spoke to patient. Reviewed with patient MW's recommendations. Patient stated she already has the Tylenol #3 and does not need more. Rx for antibiotic sent to [facility] at [facility] per patient request. Nothing further is needed currently.

## 2017-12-31 ENCOUNTER — Other Ambulatory Visit (HOSPITAL_COMMUNITY)
Admission: RE | Admit: 2017-12-31 | Discharge: 2017-12-31 | Disposition: A | Discharge status: Home / Self Care | Payer: Medicare Other | Source: Ambulatory Visit | Attending: Obstetrics | Admitting: Obstetrics

## 2017-12-31 ENCOUNTER — Ambulatory Visit (INDEPENDENT_AMBULATORY_CARE_PROVIDER_SITE_OTHER): Payer: Medicare Other | Admitting: Obstetrics

## 2017-12-31 ENCOUNTER — Encounter: Payer: Self-pay | Admitting: Obstetrics

## 2017-12-31 VITALS — BP 94/65 | HR 123 | Temp 98.0°F | Resp 22 | Wt 95.2 lb

## 2017-12-31 DIAGNOSIS — Z853 Personal history of malignant neoplasm of breast: Secondary | ICD-10-CM

## 2017-12-31 DIAGNOSIS — Z780 Asymptomatic menopausal state: Secondary | ICD-10-CM

## 2017-12-31 DIAGNOSIS — Z01419 Encounter for gynecological examination (general) (routine) without abnormal findings: Secondary | ICD-10-CM

## 2017-12-31 DIAGNOSIS — B9689 Other specified bacterial agents as the cause of diseases classified elsewhere: Secondary | ICD-10-CM | POA: Insufficient documentation

## 2017-12-31 DIAGNOSIS — J449 Chronic obstructive pulmonary disease, unspecified: Secondary | ICD-10-CM

## 2017-12-31 DIAGNOSIS — N898 Other specified noninflammatory disorders of vagina: Secondary | ICD-10-CM | POA: Diagnosis present

## 2017-12-31 DIAGNOSIS — N760 Acute vaginitis: Secondary | ICD-10-CM | POA: Diagnosis not present

## 2017-12-31 NOTE — Addendum Note (Signed)
Addended by: Baltazar Najjar A on: 12/31/2017 04:55 PM   Modules accepted: Orders

## 2017-12-31 NOTE — Progress Notes (Addendum)
Subjective:        Mary Jenkins is a 62 y.o. female here for a routine exam.  Current complaints: None.    Personal health questionnaire:  Is patient Ashkenazi Jewish, have a family history of breast and/or ovarian cancer: no Is there a family history of uterine cancer diagnosed at age < 18, gastrointestinal cancer, urinary tract cancer, family member who is a Field seismologist syndrome-associated carrier: no Is the patient overweight and hypertensive, family history of diabetes, personal history of gestational diabetes, preeclampsia or PCOS: no Is patient over 35, have PCOS,  family history of premature CHD under age 21, diabetes, smoke, have hypertension or peripheral artery disease:  no At any time, has a partner hit, kicked or otherwise hurt or frightened you?: no Over the past 2 weeks, have you felt down, depressed or hopeless?: no Over the past 2 weeks, have you felt little interest or pleasure in doing things?:no   Gynecologic History No LMP recorded (exact date). Patient is postmenopausal. Contraception: post menopausal status Last Pap: 2017. Results were: normal Last mammogram: 2018. Results were: normal  Obstetric History OB History  No data available    Past Medical History:  Diagnosis Date  . Breast cancer ([place])   . Cataract, immature    bilateral  . COPD (chronic obstructive pulmonary disease) ([place])    no home O2  . Cough 02/09/2015  . DDD (degenerative disc disease), cervical   . Exertional shortness of breath    states if she "takes her time" doing activities does not get SOB  . Family history of adverse reaction to anesthesia    pt's sister has hx. of post-op N/V  . Full dentures   . GERD (gastroesophageal reflux disease) 02/03/2012  . High cholesterol    no current med.  Marland Kitchen History of breast cancer   . Personal history of chemotherapy 1997  . Runny nose 02/09/2015   clear drainage, per pt.    Past Surgical History:  Procedure Laterality Date  . BREAST  CAPSULOTOMY WITH IMPLANT EXCHANGE Right 06/21/2013   Procedure: REVISION RIGHT BREAST RECONSTRUCTION/REMOVAL OF RIGHT IMPLANT/RIGHT BREAST CAPSULOTOMY WITH INSERT TISSUE EXPLANDER RIGHT BREAST/POSSIBLE LEFT BREAST MASTOPEXY;  Surgeon: Cristine Polio, MD;  Location: [place];  Service: Plastics;  Laterality: Right;  . BREAST RECONSTRUCTION Right 06/21/2013   Procedure: BREAST RECONSTRUCTION;  Surgeon: Cristine Polio, MD;  Location: [place];  Service: Plastics;  Laterality: Right;  . BREAST RECONSTRUCTION Left 11/29/2013   Procedure: LEFT MASTOPEXY FOR RECONSTRUCTION;  Surgeon: Cristine Polio, MD;  Location: [place];  Service: Plastics;  Laterality: Left;  . BREAST RECONSTRUCTION Right 03/14/2014   Procedure: RECONSTRUCTION NIPPLE RIGHT BREAST;  Surgeon: Cristine Polio, MD;  Location: [place];  Service: Plastics;  Laterality: Right;  . BREAST SURGERY  97   implant  . CAPSULOTOMY Right 06/30/2017   Procedure: CAPSULOTOMY;  Surgeon: Cristine Polio, MD;  Location: [place];  Service: Plastics;  Laterality: Right;  . COLONOSCOPY    . MASTECTOMY, PARTIAL Right 1997   -node dissection  . REDUCTION MAMMAPLASTY Left   . REMOVAL OF TISSUE EXPANDER AND PLACEMENT OF IMPLANT Right 02/13/2015   Procedure: REMOVAL OF TISSUE  EXPANDER  PORT RIGHT BREAST;  Surgeon: Cristine Polio, MD;  Location: [place];  Service: Plastics;  Laterality: Right;  . REMOVAL OF TISSUE EXPANDER AND PLACEMENT OF IMPLANT Right 06/30/2017   Procedure: REMOVAL OF RIGHT TISSUE EXPANDER WITH PLACEMENT OF  RIGHT GELL BREAST IMPLANTS;  Surgeon: Cristine Polio, MD;  Location: [place];  Service: Plastics;  Laterality: Right;  . SCAR REVISION Right 02/13/2015   Procedure: SCAR REVISION RIGHT BREAST;  Surgeon: Cristine Polio, MD;  Location: [place];  Service: Plastics;  Laterality: Right;  . TISSUE  EXPANDER PLACEMENT Right 03/14/2014   Procedure: SALINE REMOVAL RIGHT TISSUE EXPANDER;  Surgeon: Cristine Polio, MD;  Location: [place];  Service: Plastics;  Laterality: Right;  . TUBAL LIGATION  1980's     Current Outpatient Medications:  .  acetaminophen-codeine (TYLENOL #3) 300-30 MG tablet, Take 1 tablet by mouth every 4 (four) hours as needed for moderate pain., Disp: 28 tablet, Rfl: 0 .  albuterol (PROVENTIL) (2.5 MG/3ML) 0.083% nebulizer solution, Take 3 mLs (2.5 mg total) by nebulization every 6 (six) hours as needed for wheezing. For wheezing, Disp: 1620 mL, Rfl: 3 .  albuterol (PROVENTIL) (2.5 MG/3ML) 0.083% nebulizer solution, USE 3 ML IN NEBULIZER  EVERY 6 HOURS AS NEEDED FOR WHEEZING OR SHORTNESS OF BREATH, Disp: 120 mL, Rfl: 11 .  albuterol (VENTOLIN HFA) 108 (90 Base) MCG/ACT inhaler, INHALE 2 PUFFS BY MOUTH EVERY 6 HOURS AS NEEDED FOR WHEEZING, Disp: 54 each, Rfl: 3 .  budesonide-formoterol (SYMBICORT) 160-4.5 MCG/ACT inhaler, Inhale 2 puffs into the lungs 2 (two) times daily., Disp: 6 g, Rfl: 1 .  omeprazole (PRILOSEC) 20 MG capsule, Take 1 capsule (20 mg total) by mouth daily as needed. (Patient taking differently: Take 20 mg by mouth at bedtime. ), Disp: 90 capsule, Rfl: 0 .  Respiratory Therapy Supplies (FLUTTER) DEVI, Use as directed, Disp: 1 each, Rfl: 0 .  RESTASIS 0.05 % ophthalmic emulsion, Place into both eyes 2 (two) times daily., Disp: , Rfl:  .  Tiotropium Bromide Monohydrate (SPIRIVA RESPIMAT) 2.5 MCG/ACT AERS, INHALE TWO PUFFS BY MOUTH ONCE DAILY IN THE MORNING, Disp: 1 Inhaler, Rfl: 11 No Known Allergies  Social History   Tobacco Use  . Smoking status: Former Smoker    Last attempt to quit: 07/23/2012    Years since quitting: 5.4  . Smokeless tobacco: Never Used  Substance Use Topics  . Alcohol use: No    Alcohol/week: 0.0 oz    Family History  Problem Relation Age of Onset  . Hypertension Mother   . Hypertension Sister   . Anesthesia  problems Sister        post-op N/V  . Heart disease Brother       Review of Systems  Constitutional: positive for weight loss Respiratory: negative for cough and wheezing Cardiovascular: negative for chest pain, fatigue and palpitations Gastrointestinal: negative for abdominal pain and change in bowel habits Musculoskeletal:negative for myalgias Neurological: negative for gait problems and tremors Behavioral/Psych: negative for abusive relationship, depression Endocrine: negative for temperature intolerance    Genitourinary:negative for abnormal menstrual periods, genital lesions, hot flashes, sexual problems and vaginal discharge Integument/breast: negative for breast lump, breast tenderness, nipple discharge and skin lesion(s)    Objective:       BP 94/65 (BP Location: Left Arm, Patient Position: Sitting, Cuff Size: Small)   Pulse (!) 123   Temp 98 F (36.7 C) (Oral)   Resp (!) 22   Wt 95 lb 3.2 oz (43.2 kg)   LMP  (Exact Date)   BMI 15.37 kg/m  General:   alert  Skin:   no rash or abnormalities  Lungs:   clear to auscultation bilaterally  Heart:   regular rate and  rhythm, S1, S2 normal, no murmur, click, rub or gallop  Breasts:   normal without suspicious masses, skin or nipple changes or axillary nodes  Abdomen:  normal findings: no organomegaly, soft, non-tender and no hernia  Pelvis:  External genitalia: normal general appearance Urinary system: urethral meatus normal and bladder without fullness, nontender Vaginal: normal without tenderness, induration or masses Cervix: normal appearance Adnexa: normal bimanual exam Uterus: anteverted and non-tender, normal size   Lab Review Urine pregnancy test Labs reviewed yes Radiologic studies reviewed yes  50% of 20 min visit spent on counseling and coordination of care.   Assessment:     1. Encounter for routine gynecological examination with Papanicolaou smear of cervix Rx: - Cytology - PAP  2.  Postmenopausal - doing well  3. History of breast cancer - in remission  4. Chronic obstructive pulmonary disease, unspecified COPD type ([place]) - managed by PCP  5. Vaginal discharge Rx: - Cervicovaginal ancillary only    Plan:    Education reviewed: calcium supplements, depression evaluation, low fat, low cholesterol diet, safe sex/STD prevention, self breast exams and weight bearing exercise. Follow up in: 2 years.   No orders of the defined types were placed in this encounter.  No orders of the defined types were placed in this encounter.   Shelly Bombard MD 12-31-2017

## 2018-01-02 LAB — CYTOLOGY - PAP
Diagnosis: NEGATIVE
HPV: NOT DETECTED

## 2018-01-02 LAB — CERVICOVAGINAL ANCILLARY ONLY
Bacterial vaginitis: POSITIVE — AB
CANDIDA VAGINITIS: NEGATIVE
CHLAMYDIA, DNA PROBE: NEGATIVE
NEISSERIA GONORRHEA: NEGATIVE
Trichomonas: NEGATIVE

## 2018-01-03 ENCOUNTER — Other Ambulatory Visit: Payer: Self-pay | Admitting: Obstetrics

## 2018-01-03 DIAGNOSIS — N760 Acute vaginitis: Principal | ICD-10-CM

## 2018-01-03 DIAGNOSIS — B9689 Other specified bacterial agents as the cause of diseases classified elsewhere: Secondary | ICD-10-CM

## 2018-01-03 MED ORDER — METRONIDAZOLE 500 MG PO TABS: 500.0000 mg | ORAL_TABLET | Freq: Two times a day (BID) | ORAL | 2 refills | Status: DC

## 2018-01-15 ENCOUNTER — Encounter: Payer: Self-pay | Admitting: Internal Medicine

## 2018-01-15 ENCOUNTER — Ambulatory Visit (INDEPENDENT_AMBULATORY_CARE_PROVIDER_SITE_OTHER): Payer: Medicare Other | Admitting: Internal Medicine

## 2018-01-15 VITALS — BP 118/80 | HR 63 | Ht 66.0 in | Wt 98.0 lb

## 2018-01-15 DIAGNOSIS — J449 Chronic obstructive pulmonary disease, unspecified: Secondary | ICD-10-CM | POA: Diagnosis not present

## 2018-01-15 DIAGNOSIS — J441 Chronic obstructive pulmonary disease with (acute) exacerbation: Secondary | ICD-10-CM

## 2018-01-15 MED ORDER — PREDNISONE 10 MG PO TABS: ORAL_TABLET | ORAL | 0 refills | Status: DC

## 2018-01-15 NOTE — Progress Notes (Signed)
Subjective:    Patient ID: Mary Jenkins, female    DOB: December 20, 1955  MRN: [id number]    Brief patient profile:   45 yobf quit smoking  07/24/11 not taking breathing medications at that point and subsequently placed on multiple meds and still sob just getting dressed so referred from the medicine clinic at [facility] 02/13/2012 for pulmonary eval with documented GOLD IV COPD 02/2013     Brief patient profile:  02/13/2012 1st pulmonary cc progressive worse x 6 months doe x 50-100 ft can't do a grocery store where could do before quit. No real variability, cough is better with no excess or purulent sputum.  Not using saba daytime because finds if she's holding still doesn't need it as oftern. Some better p last  Prednisone rx. Not much variability. rec Stop atrovent and advair Start symbiocort Take 2 puffs first thing in am and then another 2 puffs about 12 hours later.  Take after only am dose Spiriva Protonix 40 mg Take 30-60 min before first meal of the day  GERD diet Only use your albuterol (Plan B= ventolin puffer,  Plan C is nebulizer) as a rescue medication to be used if you can't catch your breath by resting or doing a relaxed purse lip breathing pattern. The less you use it, the better it will work when you need it. Ok to use rescue up to every 4 hours if doing poorly.       12/18/2017  f/u ov/Hideko Esselman re:  COPD IV/ symb/ spiriva tapering pred  Chief Complaint  Patient presents with  . Follow-up    SOB with activity, productive cough -yellow thick mucus, slight wheezing at night more   Dyspnea:  MMRC3 = can't walk 100 yards even at a slow pace at a flat grade s stopping due to sob  Cough: congested cough  Sleep: ok flat most noct SABA use:  Not using  In any form  Rec Work on inhaler technique:  Finish prednisone as you  plan  For cough > mucinex dm up to 1200 mg daily and use flutter valve as much as possible  Whenever cough flares should be on priloec 20 mg Take 30- 60 min before your  first and last meals of the day then ok when cough better for a week to leave the prilosec back off  Please schedule a follow up office visit in 4 weeks, sooner if needed  with all medications /inhalers/ solutions in hand so we can verify exactly what you are taking. This includes all medications from all doctors and over the counters     01/15/2018  f/u ov/Kayce Betty re: copd iv/ did not bring meds  Chief Complaint  Patient presents with  . Follow-up    Reports increased SOB since weather has changed. Reports her daughter used some kind of incense mist and her breathing has been worse every since. No improvement noted with neb or inhaler.    Dyspnea:  Still MMRC3 = can't walk 100 yards even at a slow pace at a flat grade s stopping due to sob   Cough: worse with certain foods / exp to incense / non prod Sleep:   No 02 / flat ok  SABA use:  Last used 2 weeks prior to OV   Feels much better on prednisone  And worse off it  Taking ppi hs    No obvious other patterns in   day to day or daytime variability or assoc excess/ purulent sputum or  mucus plugs or hemoptysis or cp or chest tightness, subjective wheeze or overt sinus or hb symptoms. No unusual exposure hx or h/o childhood pna/ asthma or knowledge of premature birth.  Sleeping  Ok flat   without nocturnal  or early am exacerbation  of respiratory  c/o's or need for noct saba. Also denies any obvious fluctuation of symptoms with weather or environmental changes or other aggravating or alleviating factors except as outlined above   Current Allergies, Complete Past Medical History, Past Surgical History, Family History, and Social History were reviewed in [employer] record.  ROS  The following are not active complaints unless bolded Hoarseness, sore throat, dysphagia, dental problems, itching, sneezing,  nasal congestion or discharge of excess mucus or purulent secretions, ear ache,   fever, chills, sweats, unintended wt loss  or wt gain, classically pleuritic or exertional cp,  orthopnea pnd or arm/hand swelling  or leg swelling, presyncope, palpitations, abdominal pain, anorexia, nausea, vomiting, diarrhea  or change in bowel habits or change in bladder habits, change in stools or change in urine, dysuria, hematuria,  rash, arthralgias, visual complaints, headache, numbness, weakness or ataxia or problems with walking or coordination,  change in mood or  Memory.   Meds: not able to verify what she takes            Objective:   Physical Exam    amb thin bf nad      Vital signs reviewed - Note on arrival 02 sats  94% on RA       Wt 120 02/13/2012  > 119 03/13/2012 > 04/22/2012 117 > 07/21/2012 122> 12/02/2012  131> 03/22/2013  131 > 04/19/2013  129  > 07/19/2013 131 > 06/06/2014 129 >  11/11/2014  132> 02/28/2015   129 > 01/16/2016  115 >  02/29/2016  110 > 05/31/2016  115  > 11/21/2016   117 > 02/21/2017   107 > 06/16/2017 102 > 08/25/2017   106 > 11/26/2017 99 > 12/18/2017 >  01/15/2018  98         HEENT: nl  oropharynx. Nl external ear canals without cough reflex - mild bilateral non-specific turbinate edema   -  Full dentures    NECK :  without JVD/Nodes/TM/ nl carotid upstrokes bilaterally   LUNGS: no acc muscle use,  Mod barrel  contour chest wall with bilateral  Distant bs s audible wheeze and  without cough on insp or exp maneuver and mod   Hyperresonant  to  percussion bilaterally     CV:  RRR  no s3 or murmur or increase in P2, and no edema   ABD:  soft and nontender with pos mid insp Hoover's  in the supine position. No bruits or organomegaly appreciated, bowel sounds nl  MS:   Nl gait/  ext warm without deformities, calf tenderness, cyanosis or clubbing No obvious joint restrictions   SKIN: warm and dry without lesions    NEURO:  alert, approp, nl sensorium with  no motor or cerebellar deficits apparent.               I personally reviewed images and agree with radiology impression as follows:   CXR:   12/15/17 COPD changes without acute infiltrate.                     Assessment & Plan:

## 2018-01-15 NOTE — Patient Instructions (Addendum)
Prednisone 10 mg take  4 each am x 2 days,   2 each am x 2 days,  1 each am x 2 days and stop   Change prilosec to Take 30-60 min before first meal of the day and if cough or breathing worse  Take 30- 60 min before your first and last meals of the day   No change on your pulmonary medications   Please schedule a follow up visit in 3 months but call sooner if needed  - see Tammy NP

## 2018-01-16 ENCOUNTER — Other Ambulatory Visit: Payer: Self-pay | Admitting: Obstetrics

## 2018-01-16 DIAGNOSIS — A6000 Herpesviral infection of urogenital system, unspecified: Secondary | ICD-10-CM

## 2018-01-19 ENCOUNTER — Encounter: Payer: Self-pay | Admitting: Internal Medicine

## 2018-01-19 NOTE — Assessment & Plan Note (Addendum)
PFT performed on 06/27/2011. Poor quality.    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 prior to study with DLCO  24 % corrects to 33 % for alv volume   - 11/26/2017  After extensive coaching inhaler device  effectiveness =    75% (short Ti) - referred to rehab again 11/26/2017   Mild flare p exp to incense > rx  Prednisone 10 mg take  4 each am x 2 days,   2 each am x 2 days,  1 each am x 2 days and stop and re-instructed on approp timing for gerd rx/ add pepcid at hs when coughing.  Asked again to return with all meds in hand using a trust but verify approach to confirm accurate Medication  Reconciliation The principal here is that until we are certain that the  patients are doing what we've asked, it makes no sense to ask them to do more.
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
## 2018-01-23 ENCOUNTER — Telehealth: Payer: Self-pay | Admitting: Internal Medicine

## 2018-01-23 MED ORDER — TIOTROPIUM BROMIDE MONOHYDRATE 1.25 MCG/ACT IN AERS: 2.0000 | INHALATION_SPRAY | Freq: Every day | RESPIRATORY_TRACT | 11 refills | Status: DC

## 2018-01-23 NOTE — Telephone Encounter (Signed)
Received a fax from [facility] stating spiriva 2.5 is on backorder  Per MW -call in the 1.25 spiriva instead 2 puffs daily  Pt aware and rx was sent

## 2018-02-01 ENCOUNTER — Observation Stay (HOSPITAL_COMMUNITY)
Admission: EM | Admit: 2018-02-01 | Discharge: 2018-02-02 | Disposition: A | Discharge status: Home / Self Care | Payer: Medicare Other | Attending: Internal Medicine | Admitting: Internal Medicine

## 2018-02-01 ENCOUNTER — Emergency Department (HOSPITAL_COMMUNITY): Payer: Medicare Other

## 2018-02-01 ENCOUNTER — Encounter (HOSPITAL_COMMUNITY): Payer: Self-pay

## 2018-02-01 DIAGNOSIS — R0602 Shortness of breath: Secondary | ICD-10-CM | POA: Diagnosis not present

## 2018-02-01 DIAGNOSIS — Z87891 Personal history of nicotine dependence: Secondary | ICD-10-CM | POA: Diagnosis not present

## 2018-02-01 DIAGNOSIS — R05 Cough: Secondary | ICD-10-CM | POA: Diagnosis not present

## 2018-02-01 DIAGNOSIS — Z8249 Family history of ischemic heart disease and other diseases of the circulatory system: Secondary | ICD-10-CM | POA: Diagnosis not present

## 2018-02-01 DIAGNOSIS — J441 Chronic obstructive pulmonary disease with (acute) exacerbation: Secondary | ICD-10-CM | POA: Diagnosis not present

## 2018-02-01 DIAGNOSIS — Z66 Do not resuscitate: Secondary | ICD-10-CM | POA: Insufficient documentation

## 2018-02-01 DIAGNOSIS — K219 Gastro-esophageal reflux disease without esophagitis: Secondary | ICD-10-CM | POA: Diagnosis not present

## 2018-02-01 DIAGNOSIS — Z7951 Long term (current) use of inhaled steroids: Secondary | ICD-10-CM | POA: Diagnosis not present

## 2018-02-01 DIAGNOSIS — R0902 Hypoxemia: Secondary | ICD-10-CM | POA: Diagnosis not present

## 2018-02-01 DIAGNOSIS — E7800 Pure hypercholesterolemia, unspecified: Secondary | ICD-10-CM | POA: Diagnosis not present

## 2018-02-01 DIAGNOSIS — R0603 Acute respiratory distress: Secondary | ICD-10-CM | POA: Insufficient documentation

## 2018-02-01 DIAGNOSIS — R069 Unspecified abnormalities of breathing: Secondary | ICD-10-CM | POA: Diagnosis not present

## 2018-02-01 LAB — CBC WITH DIFFERENTIAL/PLATELET
Basophils Absolute: 0 10*3/uL (ref 0.0–0.1)
Basophils Relative: 1 %
Eosinophils Absolute: 0.3 10*3/uL (ref 0.0–0.7)
Eosinophils Relative: 3 %
HCT: 38.8 % (ref 36.0–46.0)
Hemoglobin: 12.2 g/dL (ref 12.0–15.0)
Lymphocytes Relative: 31 %
Lymphs Abs: 2.4 10*3/uL (ref 0.7–4.0)
MCH: 26.5 pg (ref 26.0–34.0)
MCHC: 31.4 g/dL (ref 30.0–36.0)
MCV: 84.3 fL (ref 78.0–100.0)
Monocytes Absolute: 0.6 10*3/uL (ref 0.1–1.0)
Monocytes Relative: 8 %
Neutro Abs: 4.6 10*3/uL (ref 1.7–7.7)
Neutrophils Relative %: 57 %
Platelets: 248 10*3/uL (ref 150–400)
RBC: 4.6 MIL/uL (ref 3.87–5.11)
RDW: 17.3 % — ABNORMAL HIGH (ref 11.5–15.5)
WBC: 7.9 10*3/uL (ref 4.0–10.5)

## 2018-02-01 LAB — I-STAT VENOUS BLOOD GAS, ED
Acid-Base Excess: 2 mmol/L (ref 0.0–2.0)
Bicarbonate: 30 mmol/L — ABNORMAL HIGH (ref 20.0–28.0)
O2 Saturation: 48 %
TCO2: 32 mmol/L (ref 22–32)
pCO2, Ven: 62.6 mmHg — ABNORMAL HIGH (ref 44.0–60.0)
pH, Ven: 7.289 (ref 7.250–7.430)
pO2, Ven: 30 mmHg — CL (ref 32.0–45.0)

## 2018-02-01 LAB — I-STAT TROPONIN, ED: Troponin i, poc: 0.01 ng/mL (ref 0.00–0.08)

## 2018-02-01 LAB — I-STAT CG4 LACTIC ACID, ED: Lactic Acid, Venous: 1.06 mmol/L (ref 0.5–1.9)

## 2018-02-01 LAB — BASIC METABOLIC PANEL
Anion gap: 5 (ref 5–15)
BUN: 7 mg/dL (ref 6–20)
CO2: 28 mmol/L (ref 22–32)
Calcium: 8.5 mg/dL — ABNORMAL LOW (ref 8.9–10.3)
Chloride: 108 mmol/L (ref 101–111)
Creatinine, Ser: 0.88 mg/dL (ref 0.44–1.00)
GFR calc Af Amer: >60 mL/min (ref >60)
GFR calc non Af Amer: >60 mL/min (ref >60)
Glucose, Bld: 131 mg/dL — ABNORMAL HIGH (ref 65–99)
Potassium: 3.9 mmol/L (ref 3.5–5.1)
Sodium: 141 mmol/L (ref 135–145)

## 2018-02-01 LAB — BRAIN NATRIURETIC PEPTIDE: B Natriuretic Peptide: 35.5 pg/mL (ref 0.0–100.0)

## 2018-02-01 MED ORDER — IOPAMIDOL (ISOVUE-370) INJECTION 76%
INTRAVENOUS | Status: AC
  Administered 2018-02-01: 100 mL
  Filled 2018-02-01: qty 100

## 2018-02-01 MED ORDER — SODIUM CHLORIDE 0.9% FLUSH
3.0000 mL | Freq: Two times a day (BID) | INTRAVENOUS | Status: DC
  Administered 2018-02-01 – 2018-02-02 (×3): 3 mL via INTRAVENOUS

## 2018-02-01 MED ORDER — POLYETHYLENE GLYCOL 3350 17 G PO PACK: 17.0000 g | PACK | Freq: Every day | ORAL | Status: DC | PRN

## 2018-02-01 MED ORDER — PANTOPRAZOLE SODIUM 40 MG PO TBEC
40.0000 mg | DELAYED_RELEASE_TABLET | Freq: Every day | ORAL | Status: DC
  Administered 2018-02-01 – 2018-02-02 (×2): 40 mg via ORAL
  Filled 2018-02-01 (×2): qty 1

## 2018-02-01 MED ORDER — PREDNISONE 20 MG PO TABS
40.0000 mg | ORAL_TABLET | Freq: Every day | ORAL | Status: DC
  Administered 2018-02-02: 40 mg via ORAL
  Filled 2018-02-01: qty 2

## 2018-02-01 MED ORDER — ENOXAPARIN SODIUM 40 MG/0.4ML ~~LOC~~ SOLN
40.0000 mg | SUBCUTANEOUS | Status: DC
  Administered 2018-02-01: 40 mg via SUBCUTANEOUS
  Filled 2018-02-01: qty 0.4

## 2018-02-01 MED ORDER — MELATONIN 3 MG PO TABS
6.0000 mg | ORAL_TABLET | Freq: Every day | ORAL | Status: DC
  Administered 2018-02-01: 6 mg via ORAL
  Filled 2018-02-01 (×2): qty 2

## 2018-02-01 MED ORDER — MOMETASONE FURO-FORMOTEROL FUM 200-5 MCG/ACT IN AERO
2.0000 | INHALATION_SPRAY | Freq: Two times a day (BID) | RESPIRATORY_TRACT | Status: DC
  Filled 2018-02-01: qty 8.8

## 2018-02-01 MED ORDER — IPRATROPIUM-ALBUTEROL 0.5-2.5 (3) MG/3ML IN SOLN
3.0000 mL | Freq: Four times a day (QID) | RESPIRATORY_TRACT | Status: DC
  Administered 2018-02-01 – 2018-02-02 (×4): 3 mL via RESPIRATORY_TRACT
  Filled 2018-02-01 (×5): qty 3

## 2018-02-01 MED ORDER — IPRATROPIUM-ALBUTEROL 0.5-2.5 (3) MG/3ML IN SOLN
3.0000 mL | Freq: Once | RESPIRATORY_TRACT | Status: AC
  Administered 2018-02-01: 3 mL via RESPIRATORY_TRACT
  Filled 2018-02-01: qty 3

## 2018-02-01 MED ORDER — NON FORMULARY: 6.0000 mg | Freq: Every day | Status: DC

## 2018-02-01 MED ORDER — CYCLOSPORINE 0.05 % OP EMUL
2.0000 [drp] | Freq: Every day | OPHTHALMIC | Status: DC
  Administered 2018-02-01 – 2018-02-02 (×2): 2 [drp] via OPHTHALMIC
  Filled 2018-02-01 (×2): qty 1

## 2018-02-01 MED ORDER — MOMETASONE FURO-FORMOTEROL FUM 200-5 MCG/ACT IN AERO
2.0000 | INHALATION_SPRAY | Freq: Two times a day (BID) | RESPIRATORY_TRACT | Status: DC
  Administered 2018-02-01 – 2018-02-02 (×2): 2 via RESPIRATORY_TRACT
  Filled 2018-02-01: qty 8.8

## 2018-02-01 MED ORDER — ACETAMINOPHEN 650 MG RE SUPP: 650.0000 mg | Freq: Four times a day (QID) | RECTAL | Status: DC | PRN

## 2018-02-01 MED ORDER — ACETAMINOPHEN 325 MG PO TABS: 650.0000 mg | ORAL_TABLET | Freq: Four times a day (QID) | ORAL | Status: DC | PRN

## 2018-02-01 NOTE — ED Provider Notes (Signed)
Medical screening examination/treatment/procedure(s) were conducted as a shared visit with non-physician practitioner(s) and myself.  I personally evaluated the patient during the encounter.  62 year old female with a past medical history of breast cancer status post mastectomy and chemotherapy and likely in remission as it was in the 90s.  She also has history of COPD does no longer smoke but is on breathing treatments and inhalers at home.  Patient states she started having worsening shortness of breath last night for which she did do a couple nebulized treatments and inhaler did not seem to help.  She had worsening shortness of breath when she lay flat.  [place] is due to the [place] this [place] to continue to get worse she came here for evaluation.  With EMS she was given Solu-Medrol, Atrovent, albuterol and magnesium and put on BiPAP as she apparently was in respiratory distress when they got there.  On my evaluation patient still tachypnea cannot speak in full sentences on her BiPAP and sitting upright.  She has diminished breath sounds bilaterally is difficult here she has any wheezing or crackles or anything along those lines.  She is no lower extremity swelling.  She has no JVD. COPD versus CHF versus pulmonary embolus or pneumonia.  We will keep her on BiPAP for now check a VBG and work-up for the other causes of her shortness of breath.  Chest x-ray on my evaluation has opacity in the right middle lobe consistent with her having a implant there make a difficult to tell if she has any consolidation.  Low threshold for CT scan if no other cause found.  None     Jeannie Mallinger, Corene Cornea, MD 02/01/18 [phone]

## 2018-02-01 NOTE — Progress Notes (Signed)
Patient inquired about sleep medication. Arthor Captain LPN

## 2018-02-01 NOTE — ED Triage Notes (Addendum)
Pt brought in by EMS due having respiratory distress. Pt did do neb treatments at home. SOB started last night and continued to get worse throughout the night. Pt has hx of COPD and breast CA. Pt on CPAP on arrival. Pt received 33m albuterol, 15mof atrovent, 2g mag, and 12579mf solumedrol.

## 2018-02-01 NOTE — ED Notes (Signed)
Attempted report x1. Will call again in 5-10 minutes

## 2018-02-01 NOTE — ED Provider Notes (Signed)
[facility] EMERGENCY DEPARTMENT Provider Note   CSN: [id number] Arrival date & time: 02/01/18  7253     History   Chief Complaint Chief Complaint  Patient presents with  . Respiratory Distress    HPI Mary Jenkins is a 62 y.o. female with history of COPD who presents in respiratory distress.  Patient reports she began having shortness of breath and wheezing last night.  She used nebulizers at home without relief.  EMS gave 10 mg albuterol, 1 mg Atrovent, 125 mg Solu-Medrol, 2 g of magnesium and placed the patient on CPAP.  Patient is starting to feel better.  She was placed on BiPAP prior to my evaluation.  She reports her shortness of breath began sort of suddenly yesterday.  She had some orthopnea.  She denies any cough prior to today, however has had coughing today.  She denies any chest pain, abdominal pain, nausea, vomiting, fevers.  Patient last took steroids 2 weeks ago and she was doing well.  She is not on oxygen at home.  HPI  Past Medical History:  Diagnosis Date  . Breast cancer ([place])   . Cataract, immature    bilateral  . COPD (chronic obstructive pulmonary disease) ([place])    no home O2  . Cough 02/09/2015  . DDD (degenerative disc disease), cervical   . Exertional shortness of breath    states if she "takes her time" doing activities does not get SOB  . Family history of adverse reaction to anesthesia    pt's sister has hx. of post-op N/V  . Full dentures   . GERD (gastroesophageal reflux disease) 02/03/2012  . High cholesterol    no current med.  Marland Kitchen History of breast cancer   . Personal history of chemotherapy 1997  . Runny nose 02/09/2015   clear drainage, per pt.    Patient Active Problem List   Diagnosis Date Noted  . Weight loss 08/02/2017  . COPD with acute exacerbation ([place]) 06/16/2017  . 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 Encounter for HCV screening test for low  risk patient 03/10/2017  . History of hyperkalemia 02/13/2016  . Skin lesion of back 11/15/2014  . Atypical chest pain 10/21/2014  . Cataract 10/21/2014  . Dyslipidemia, goal to be determined 04/22/2014  . Lipoma 10/07/2012  . Cervical spine arthritis 08/27/2012  . History of breast cancer 07/23/2012  . GERD (gastroesophageal reflux disease) 02/03/2012  . COPD GOLD IV 06/24/2011  . Liver cyst 06/24/2011  . Insomnia 06/24/2011    Past Surgical History:  Procedure Laterality Date  . BREAST CAPSULOTOMY WITH IMPLANT EXCHANGE Right 06/21/2013   Procedure: REVISION RIGHT BREAST RECONSTRUCTION/REMOVAL OF RIGHT IMPLANT/RIGHT BREAST CAPSULOTOMY WITH INSERT TISSUE EXPLANDER RIGHT BREAST/POSSIBLE LEFT BREAST MASTOPEXY;  Surgeon: Cristine Polio, MD;  Location: [facility];  Service: Plastics;  Laterality: Right;  . BREAST RECONSTRUCTION Right 06/21/2013   Procedure: BREAST RECONSTRUCTION;  Surgeon: Cristine Polio, MD;  Location: [place] [facility];  Service: Plastics;  Laterality: Right;  . BREAST RECONSTRUCTION Left 11/29/2013   Procedure: LEFT MASTOPEXY FOR RECONSTRUCTION;  Surgeon: Cristine Polio, MD;  Location: [place];  Service: Plastics;  Laterality: Left;  . BREAST RECONSTRUCTION Right 03/14/2014   Procedure: RECONSTRUCTION NIPPLE RIGHT BREAST;  Surgeon: Cristine Polio, MD;  Location: [facility];  Service: Plastics;  Laterality: Right;  . BREAST SURGERY  97   implant  . CAPSULOTOMY Right 06/30/2017  Procedure: CAPSULOTOMY;  Surgeon: Cristine Polio, MD;  Location: [facility];  Service: Plastics;  Laterality: Right;  . COLONOSCOPY    . MASTECTOMY, PARTIAL Right 1997   -node dissection  . REDUCTION MAMMAPLASTY Left   . REMOVAL OF TISSUE EXPANDER AND PLACEMENT OF IMPLANT Right 02/13/2015   Procedure: REMOVAL OF TISSUE  EXPANDER  PORT RIGHT BREAST;  Surgeon: Cristine Polio, MD;  Location: [facility];   Service: Plastics;  Laterality: Right;  . REMOVAL OF TISSUE EXPANDER AND PLACEMENT OF IMPLANT Right 06/30/2017   Procedure: REMOVAL OF RIGHT TISSUE EXPANDER WITH PLACEMENT OF RIGHT GELL BREAST IMPLANTS;  Surgeon: Cristine Polio, MD;  Location: [facility];  Service: Plastics;  Laterality: Right;  . SCAR REVISION Right 02/13/2015   Procedure: SCAR REVISION RIGHT BREAST;  Surgeon: Cristine Polio, MD;  Location: [facility];  Service: Plastics;  Laterality: Right;  . TISSUE EXPANDER PLACEMENT Right 03/14/2014   Procedure: SALINE REMOVAL RIGHT TISSUE EXPANDER;  Surgeon: Cristine Polio, MD;  Location: [facility];  Service: Plastics;  Laterality: Right;  . TUBAL LIGATION  1980's     OB History   None      Home Medications    Prior to Admission medications   Medication Sig Start Date End Date Taking? Authorizing Provider  albuterol (PROVENTIL) (2.5 MG/3ML) 0.083% nebulizer solution USE 3 ML IN NEBULIZER  EVERY 6 HOURS AS NEEDED FOR WHEEZING OR SHORTNESS OF BREATH 12/22/17  Yes Tanda Rockers, MD  albuterol (VENTOLIN HFA) 108 (90 Base) MCG/ACT inhaler INHALE 2 PUFFS BY MOUTH EVERY 6 HOURS AS NEEDED FOR WHEEZING Patient taking differently: Inhale 2 puffs into the lungs every 6 (six) hours as needed for wheezing.  08/01/17  Yes Chundi, Verne Spurr, MD  budesonide-formoterol (SYMBICORT) 160-4.5 MCG/ACT inhaler Inhale 2 puffs into the lungs 2 (two) times daily. 12/16/17 02/01/18 Yes Chundi, Vahini, MD  ipratropium (ATROVENT) 0.02 % nebulizer solution Inhale 500 mcg into the lungs every 6 (six) hours as needed for wheezing. 07/27/15  Yes [provider]  metroNIDAZOLE (FLAGYL) 500 MG tablet Take 1 tablet (500 mg total) by mouth 2 (two) times daily. 01/03/18  Yes Shelly Bombard, MD  omeprazole (PRILOSEC) 20 MG capsule Take 1 capsule (20 mg total) by mouth daily as needed. Patient taking differently: Take 20 mg by mouth at bedtime.  06/23/17  Yes Joni Reining C, DO  RESTASIS 0.05 % ophthalmic emulsion Place 2 drops into both eyes daily.  11/09/14  Yes [provider]  Tiotropium Bromide Monohydrate (SPIRIVA RESPIMAT) 1.25 MCG/ACT AERS Inhale 2 puffs into the lungs daily. 01/23/18  Yes Tanda Rockers, MD  acetaminophen-codeine (TYLENOL #3) 300-30 MG tablet Take 1 tablet by mouth every 4 (four) hours as needed for moderate pain. Patient not taking: Reported on 02/01/2018 12/16/17   Tanda Rockers, MD  predniSONE (DELTASONE) 10 MG tablet Take  4 each am x 2 days,   2 each am x 2 days,  1 each am x 2 days and stop Patient not taking: Reported on 02/01/2018 01/15/18   Tanda Rockers, MD  Tiotropium Bromide Monohydrate ([place]) 2.5 MCG/ACT AERS INHALE TWO PUFFS BY MOUTH ONCE DAILY IN THE MORNING Patient not taking: Reported on 02/01/2018 11/26/17   Tanda Rockers, MD  valACYclovir (VALTREX) 1000 MG tablet TAKE ONE TABLET BY MOUTH TWICE DAILY FOR 3 DAYS AS NEEDED Patient not taking: Reported on 02/01/2018 01/17/18   Shelly Bombard, MD  Family History Family History  Problem Relation Age of Onset  . Hypertension Mother   . Hypertension Sister   . Anesthesia problems Sister        post-op N/V  . Heart disease Brother     Social History Social History   Tobacco Use  . Smoking status: Former Smoker    Last attempt to quit: 07/23/2012    Years since quitting: 5.5  . Smokeless tobacco: Never Used  Substance Use Topics  . Alcohol use: No    Alcohol/week: 0.0 oz  . Drug use: No     Allergies   Patient has no known allergies.   Review of Systems Review of Systems  Constitutional: Negative for chills and fever.  HENT: Negative for facial swelling and sore throat.   Respiratory: Positive for cough and shortness of breath.   Cardiovascular: Negative for chest pain.  Gastrointestinal: Negative for abdominal pain, nausea and vomiting.  Genitourinary: Negative for dysuria.  Musculoskeletal: Negative for back pain.  Skin:  Negative for rash and wound.  Neurological: Negative for headaches.  Psychiatric/Behavioral: The patient is not nervous/anxious.      Physical Exam Updated Vital Signs BP 111/80   Pulse 87   Temp (!) 95.3 F (35.2 C) (Temporal)   Resp (!) 21   Ht _0  (1.676 m)   Wt 44.5 kg (98 lb)   SpO2 100%   BMI 15.82 kg/m   Physical Exam  Constitutional: She appears well-developed and well-nourished. No distress.  HENT:  Head: Normocephalic and atraumatic.  Mouth/Throat: Oropharynx is clear and moist. No oropharyngeal exudate.  Eyes: Pupils are equal, round, and reactive to light. Conjunctivae are normal. Right eye exhibits no discharge. Left eye exhibits no discharge. No scleral icterus.  Neck: Normal range of motion. Neck supple. No thyromegaly present.  Cardiovascular: Normal rate, regular rhythm, normal heart sounds and intact distal pulses. Exam reveals no gallop and no friction rub.  No murmur heard. Pulmonary/Chest: Effort normal. No stridor. No respiratory distress. She has decreased breath sounds. She has no wheezes. She has no rales.  Breath sounds are decreased throughout, no wheezes or rales auscultated Patient on BiPAP  Abdominal: Soft. Bowel sounds are normal. She exhibits no distension. There is no tenderness. There is no rebound and no guarding.  Musculoskeletal: She exhibits no edema.  Lymphadenopathy:    She has no cervical adenopathy.  Neurological: She is alert. Coordination normal.  Skin: Skin is warm and dry. No rash noted. She is not diaphoretic. No pallor.  Psychiatric: She has a normal mood and affect.  Nursing note and vitals reviewed.    ED Treatments / Results  Labs (all labs ordered are listed, but only abnormal results are displayed) Labs Reviewed  BASIC METABOLIC PANEL - Abnormal; Notable for the following components:      Result Value   Glucose, Bld 131 (*)    Calcium 8.5 (*)    All other components within normal limits  CBC WITH  DIFFERENTIAL/PLATELET - Abnormal; Notable for the following components:   RDW 17.3 (*)    All other components within normal limits  I-STAT VENOUS BLOOD GAS, ED - Abnormal; Notable for the following components:   pCO2, Ven 62.6 (*)    pO2, Ven 30.0 (*)    Bicarbonate 30.0 (*)    All other components within normal limits  BRAIN NATRIURETIC PEPTIDE  I-STAT TROPONIN, ED  I-STAT CG4 LACTIC ACID, ED    EKG EKG Interpretation  Date/Time:  Sunday Feb 01 2018 09:41:06 EDT Ventricular Rate:  85 PR Interval:    QRS Duration: 94 QT Interval:  390 QTC Calculation: 464 R Axis:   92 Text Interpretation:  Sinus rhythm Right axis deviation Borderline T abnormalities, lateral leads No significant change since last tracing Confirmed by Merrily Pew [phone]) on 02/01/2018 11:15:02 AM   Radiology Ct Angio Chest Pe W And/or Wo Contrast  Result Date: 02/01/2018 CLINICAL DATA:  Shortness of breath beginning last night with persistent worsening. History of breast cancer. EXAM: CT ANGIOGRAPHY CHEST WITH CONTRAST TECHNIQUE: Multidetector CT imaging of the chest was performed using the standard protocol during bolus administration of intravenous contrast. Multiplanar CT image reconstructions and MIPs were obtained to evaluate the vascular anatomy. CONTRAST:  169m ISOVUE-370 IOPAMIDOL (ISOVUE-370) INJECTION 76% COMPARISON:  12/08/2017 noncontrast chest CT. FINDINGS: Cardiovascular: Pulmonary arterial opacification is excellent. There are no pulmonary emboli. There is ordinary aortic atherosclerosis without aneurysm or dissection. Heart size is normal. No coronary artery calcification. Mediastinum/Nodes: No mass or lymphadenopathy. Lungs/Pleura: Mild scattered pulmonary scarring. The lungs remain clear without evidence of infiltrate, collapse or effusion. No mass lesion. Extensive emphysematous changes throughout, upper lobe predominant. Upper Abdomen: Negative except for a 7 mm cyst at the dome of the liver.  Musculoskeletal: Ordinary mild spinal degenerative changes. No sign of bony metastatic disease. Breast implant on the right. Review of the MIP images confirms the above findings. IMPRESSION: No acute finding.  No pulmonary emboli. Advanced emphysema without focal or active process. Electronically Signed   By: MNelson ChimesM.D.   On: 02/01/2018 11:44   Dg Chest Portable 1 View  Result Date: 02/01/2018 CLINICAL DATA:  Severe shortness of breath EXAM: PORTABLE CHEST 1 VIEW COMPARISON:  December 15, 2017 FINDINGS: The heart size and mediastinal contours are within normal limits. The lungs are hyperinflated. Both lungs are clear. The visualized skeletal structures are unremarkable. IMPRESSION: No active cardiopulmonary disease.  Marked COPD. Electronically Signed   By: WAbelardo DieselM.D.   On: 02/01/2018 09:50    Procedures Procedures (including critical care time)  Medications Ordered in ED Medications  ipratropium-albuterol (DUONEB) 0.5-2.5 (3) MG/3ML nebulizer solution 3 mL (has no administration in time range)  iopamidol (ISOVUE-370) 76 % injection (100 mLs  Contrast Given 02/01/18 1120)     Initial Impression / Assessment and Plan / ED Course  I have reviewed the triage vital signs and the nursing notes.  Pertinent labs & imaging results that were available during my care of the patient were reviewed by me and considered in my medical decision making (see chart for details).     Patient with COPD exacerbation.  She arrived on CPAP and was transferred to BiPAP.  Patient ultimately was transitioned off BiPAP and was feeling back to baseline, however still hypoxic on room air.  Patient requiring 3 to 4 L oxygen.  She is very dyspneic on exertion and even just standing.  Venous blood gas shows pH 7.29, oxygen 30, CO2 62.6, bicarb 30.  CBC, BMP unremarkable.  Troponin negative.  BNP within normal limits.  Chest x-ray shows no focal consolidation.  CTA negative for PE.  I discussed patient case with  the internal medicine teaching service who will admit the patient for further management.  Patient also evaluated by Dr. MDayna Barkerwho guided the patient's management and agrees with plan.  Final Clinical Impressions(s) / ED Diagnoses   Final diagnoses:  COPD exacerbation ([facility]    ED Discharge Orders    None  Frederica Kuster, PA-C 02/01/18 1419    Mesner, Corene Cornea, MD 02/01/18 [phone]

## 2018-02-01 NOTE — ED Notes (Signed)
Ordered chicktenders, FFs, peaches, cottage chz, grape juice, honey mustard

## 2018-02-01 NOTE — H&P (Signed)
Date: 02/01/2018               Patient Name:  Mary Jenkins MRN: [id number]  DOB: 1956/02/04 Age / Sex: 62 y.o., female   PCP: Lars Mage, MD         Medical Service: Internal Medicine Teaching Service         Attending Physician: Dr. Bartholomew Crews, MD    First Contact: Dr. Thomasene Ripple Pager: [phone]  Second Contact: Dr. Lorella Nimrod Pager: [phone]       After Hours (After 5p/  First Contact Pager: [phone]  weekends / holidays): Second Contact Pager: [phone]   Chief Complaint: Shortness of breath  History of Present Illness:  Mary Jenkins is a 62 yo with PMH of COPD (gold IV, not on home O2), GERD, and right sided breast cancer who presents for evaluation of sudden onset shortness of breath. History obtained directly from the patient. Patient was attempting to go to sleep on the evening of presentation when she all of the sudden started gasping for air. The patient states that her chest felt tight and that she couldn't catch her breath during this time. She tried to sit up and take albuterol breathing treatments to help with her symptoms but neither of these interventions helped. Her symptoms progressed to the point that she was anxious and couldn't catch her breath even while sitting still, so she called EMS for evaluation. Patient noticed while she was trying to give herself breathing treatments she started to become diaphoretic, but no nausea, vomiting, radiating chest pain. During the week leading up to admission the patient noticed progressive dyspnea on exertion, which required at least once daily use of a rescue inhaler (which she frequently does not use on a daily basis). During this time she denied fevers, productive cough, and sick contacts.   Patient notes that about a month ago she had some cough with yellow sputum and congestion. When she had these symptoms of congestion, she called her primary pulmonologist who gave her antibiotics. She thinks she got two  different antibiotics during the month of April. Since then, the patient has noted that her productive cough has improved (no longer any yellow sputum). She does, however, note that her symptoms of dyspnea on exertion and increased rescue inhaler use started after stopping previous medicine prescribed by primary pulmonologist.   Upon arrival to the ED patient was afebrile, non tachycardic, and saturating >92% on BiPAP placed by EMS en route. Patient also received Mg, Solumedrol, Ipratroprium/Albuterol with EMS prior to arrival. CBC showed WBC =7.9. BMP within normal limits. VBG with pH of 7.289 and pO2 = 30.0. Chest X ray revealed hyperinflation of lungs consistent with marked COPD but no other acute cardiopulmonary process. BNP = 35.5. CT angiography of the chest was negative for acute pulmonary embolism, pneumonia but did show extensive emphysematous changes throughout bilateral lungs (upper lobes >lower lobes). Patient was weaned of BiPAP in the ED but became tachypneic while ambulating to bathroom. Because of persistent dyspnea on exertion, IMTS was called for admission.   Meds:  Current Meds  Medication Sig  . albuterol (PROVENTIL) (2.5 MG/3ML) 0.083% nebulizer solution USE 3 ML IN NEBULIZER  EVERY 6 HOURS AS NEEDED FOR WHEEZING OR SHORTNESS OF BREATH  . albuterol (VENTOLIN HFA) 108 (90 Base) MCG/ACT inhaler INHALE 2 PUFFS BY MOUTH EVERY 6 HOURS AS NEEDED FOR WHEEZING (Patient taking differently: Inhale 2 puffs into the lungs every 6 (six) hours as needed  for wheezing. )  . budesonide-formoterol (SYMBICORT) 160-4.5 MCG/ACT inhaler Inhale 2 puffs into the lungs 2 (two) times daily.  Marland Kitchen ipratropium (ATROVENT) 0.02 % nebulizer solution Inhale 500 mcg into the lungs every 6 (six) hours as needed for wheezing.  . metroNIDAZOLE (FLAGYL) 500 MG tablet Take 1 tablet (500 mg total) by mouth 2 (two) times daily.  Marland Kitchen omeprazole (PRILOSEC) 20 MG capsule Take 1 capsule (20 mg total) by mouth daily as needed.  (Patient taking differently: Take 20 mg by mouth at bedtime. )  . RESTASIS 0.05 % ophthalmic emulsion Place 2 drops into both eyes daily.   . Tiotropium Bromide Monohydrate (SPIRIVA RESPIMAT) 1.25 MCG/ACT AERS Inhale 2 puffs into the lungs daily.   Allergies: Allergies as of 02/01/2018  . (No Known Allergies)   Past Medical History: Past Medical History:  Diagnosis Date  . Breast cancer ([place])   . Cataract, immature    bilateral  . COPD (chronic obstructive pulmonary disease) ([place])    no home O2  . Cough 02/09/2015  . DDD (degenerative disc disease), cervical   . Exertional shortness of breath    states if she "takes her time" doing activities does not get SOB  . Family history of adverse reaction to anesthesia    pt's sister has hx. of post-op N/V  . Full dentures   . GERD (gastroesophageal reflux disease) 02/03/2012  . High cholesterol    no current med.  Marland Kitchen History of breast cancer   . Personal history of chemotherapy 1997  . Runny nose 02/09/2015   clear drainage, per pt.   Family History:  Family History  Problem Relation Age of Onset  . Hypertension Mother   . Hypertension Sister   . Anesthesia problems Sister        post-op N/V  . Heart disease Brother    Social History:  Patient lives at home by herself, local daughter and grandchild with whom she spent the night with yesterday. 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 Personal 2.5 pack per day history of smoking for 30-40 years. Quit entirely in 2012 when formally diagnosed with COPD. No alcohol or illicit drugs.   Review of Systems: A complete ROS was negative except as per HPI.   Physical Exam: Blood pressure 120/70, pulse 83, temperature 98.1 F (36.7 C), temperature source Oral, resp. rate 14, height _0  (1.676 m), weight 98 lb (44.5 kg), SpO2 100 %.  Physical Exam  Constitutional: She appears well-developed and well-nourished. No distress.  HENT:  Mouth/Throat: Oropharynx is clear and  moist. No oropharyngeal exudate.  Eyes: Conjunctivae and EOM are normal.  Cardiovascular: Normal rate, regular rhythm and intact distal pulses. Exam reveals no friction rub.  No murmur heard. Respiratory:  No accessory muscle use or nasal flaring. Patient speaking comfortably in full sentences during interview. Intermittent end expiratory wheezing with decreased air movement throughout bilateral lung fields. No crackles appreciated.  GI: Soft. Bowel sounds are normal. She exhibits no distension.  Musculoskeletal: She exhibits no edema (of bilateral lower extremities) or tenderness (of bilateral lower extremities).  Neurological:  Face strength and sensation intact bilaterally. Tongue midline. Gross motor and sensation to light touch of upper and lower extremities intact bilaterally.  Skin: Skin is warm and dry. No rash noted. She is not diaphoretic. No erythema.  Psychiatric: She has a normal mood and affect. Her behavior is normal.   EKG: personally reviewed my interpretation is normal sinus rhythm. Borderline right axis deviation.  No ST elevation or T wave inversion to suggest acute ischemia. Unchanged compared to prior on 11/2017.  CXR: personally reviewed my interpretation is hyperinflation of lungs with flattening of diaphragm bilaterally. No increased vascular congestion, acute opacities, or interstitial changes to suggest acute cardiopulmonary process.  Assessment & Plan by Problem: Active Problems:   COPD with acute exacerbation ([place])  Mary Jenkins is a 62 yo with PMH of COPD (gold stage IV, not on home O2), GERD, and breast cancer who presented with acute respiratory distress. She was admitted to the internal medicine teaching service for management. The specific problems addressed during admission are as follows:  Acute respiratory distress with hypoxia 2/2 COPD exacerbation: Patient presents with 1 week of worsening dyspnea on exertion and increased rescue inhaler use, consistent  with acute COPD exacerbation. Per chart review patient's latest spirometry consistent with severe obstructive disease (FEV1/FVC <40%) and severe air trapping noted on PFT, consistent with COPD Gold Stage 4 diagnosis. Patient previously seen by primary pulmonologist in April and given Levaquin for 5 days for treatment of SOB associated with productive cough. Persistent symptoms later in the month led to prescription of a slow steroid taper in the outpatient settting (initiated on 01/15/18). At seems that patient's symptoms of dyspnea leading to rescue inhaler use coincide with cessation of steroid therapy. Patient denies fevers, productive cough during this time. No recent sick contacts. No clinical or laboratory signs of infection present on admission to suggest antibiotic therapy needed at this time. Will plan to treat exacerbation with steroids and scheduled breathing treatments while inpatient. Will have low threshold to start azithromycin if patient develops infectious symptoms or productive cough.  -Admit to telemetry  -Continue prednisone 40 mg x4 days (total 5 days of therapy) -Continue DuoNebs q6 hours -Home Dulera 2 puffs BID -Ambulate with oxygen, wean to room air as tolerated  FEN/GI: -Regular diet -No IVF, replace electrolytes as needed -Home Protonix 40 mg daily  VTE Prophylaxis: Lovenox daily Code Status: DNR  Dispo: Admit patient to Observation with expected length of stay less than 2 midnights.  SignedThomasene Ripple, MD 02/01/2018, 3:44 PM  Pager: [phone]

## 2018-02-01 NOTE — ED Notes (Signed)
Called lab about BNP. Lab will add on BNP.

## 2018-02-02 ENCOUNTER — Telehealth: Payer: Self-pay | Admitting: Internal Medicine

## 2018-02-02 DIAGNOSIS — Z79899 Other long term (current) drug therapy: Secondary | ICD-10-CM | POA: Diagnosis not present

## 2018-02-02 DIAGNOSIS — Z7951 Long term (current) use of inhaled steroids: Secondary | ICD-10-CM

## 2018-02-02 DIAGNOSIS — Z853 Personal history of malignant neoplasm of breast: Secondary | ICD-10-CM

## 2018-02-02 DIAGNOSIS — J9601 Acute respiratory failure with hypoxia: Secondary | ICD-10-CM | POA: Diagnosis not present

## 2018-02-02 DIAGNOSIS — Z66 Do not resuscitate: Secondary | ICD-10-CM | POA: Diagnosis not present

## 2018-02-02 DIAGNOSIS — J441 Chronic obstructive pulmonary disease with (acute) exacerbation: Secondary | ICD-10-CM | POA: Diagnosis not present

## 2018-02-02 DIAGNOSIS — K219 Gastro-esophageal reflux disease without esophagitis: Secondary | ICD-10-CM

## 2018-02-02 DIAGNOSIS — Z7952 Long term (current) use of systemic steroids: Secondary | ICD-10-CM | POA: Diagnosis not present

## 2018-02-02 LAB — HIV ANTIBODY (ROUTINE TESTING W REFLEX): HIV Screen 4th Generation wRfx: NONREACTIVE

## 2018-02-02 LAB — BASIC METABOLIC PANEL
Anion gap: 9 (ref 5–15)
BUN: 10 mg/dL (ref 6–20)
CHLORIDE: 105 mmol/L (ref 101–111)
CO2: 24 mmol/L (ref 22–32)
CREATININE: 0.82 mg/dL (ref 0.44–1.00)
Calcium: 8.4 mg/dL — ABNORMAL LOW (ref 8.9–10.3)
GFR calc Af Amer: >60 mL/min (ref >60)
GFR calc non Af Amer: >60 mL/min (ref >60)
GLUCOSE: 100 mg/dL — AB (ref 65–99)
POTASSIUM: 4.7 mmol/L (ref 3.5–5.1)
SODIUM: 138 mmol/L (ref 135–145)

## 2018-02-02 MED ORDER — PREDNISONE 20 MG PO TABS: 40.0000 mg | ORAL_TABLET | Freq: Every day | ORAL | 0 refills | Status: AC

## 2018-02-02 MED ORDER — ENOXAPARIN SODIUM 30 MG/0.3ML ~~LOC~~ SOLN
30.0000 mg | SUBCUTANEOUS | Status: DC
  Filled 2018-02-02: qty 0.3

## 2018-02-02 NOTE — Progress Notes (Signed)
   Subjective:  Patient seen sitting comfortably in bed this AM in no acute distress. She feels overall improved since admission yesterday and was able to sleep comfortably without dyspnea overnight. Patient states that she walked around her room without significant dyspnea, which is improvement from yesterday in ED.   Objective:  Vital signs in last 24 hours: Vitals:   02/01/18 2001 02/01/18 2018 02/02/18 0030 02/02/18 0608  BP: 102/74  96/80 120/75  Pulse: 98  90 80  Resp:   18   Temp: 98.1 F (36.7 C)  98 F (36.7 C) 97.7 F (36.5 C)  TempSrc: Oral  Oral Oral  SpO2: 96% 96% 98% 95%  Weight:    98 lb 1.6 oz (44.5 kg)  Height:       Physical Exam  Constitutional: She appears well-developed and well-nourished. No distress.  HENT:  Mouth/Throat: Oropharynx is clear and moist.  Eyes: Conjunctivae and EOM are normal.  Cardiovascular: Normal rate, regular rhythm and intact distal pulses. Exam reveals no friction rub.  No murmur heard. Respiratory:  Speaking in full sentences. Mild accessory muscle use. Lungs clear to auscultation without wheezing or crackles bilaterally.  Musculoskeletal: She exhibits no edema (of bilateral lower extremitites) or tenderness (of bilateral lower extremitites).  Skin: Skin is warm and dry. No rash noted. She is not diaphoretic. No erythema.  Psychiatric: She has a normal mood and affect. Her behavior is normal.   Assessment/Plan:  Active Problems:   COPD with acute exacerbation (HCC)  Bernadetta Roell is a 62 yo with PMH of COPD (Gold Stage IV, not on home O2), GERD, and breast cancer who presented with acute respiratory distress and was found to have a COPD exacerbation. She was admitted to the internal medicine teaching service for management. The specific problems addressed during admission are as follows:  Acute respiratory distress 2/2 COPD exacerbation: Patient afebrile overnight with subjective improvement in her dyspnea on exertion noticed in  the ED yesterday. No fevers overnight to suggest infection that warrants treatment with antibiotics. Patient stable on home regimen for COPD currently. Will plan to check pulse ox with ambulation and discharge with prednisone to treat COPD exacerbation and close outpatient follow up.  -Continue prednisone 40 mg x4 days (total 5 days of therapy) -Continue DuoNebs q6 hours -Home Dulera 2 puffs BID -Check O2 saturations with ambulation  FEN/GI: -Regular diet -No IVF -Home Protonix 40 mg daily  VTE Prophylaxis: Lovenox daily Code Status: DNR  Dispo: Anticipated discharge today.  Thomasene Ripple, MD 02/02/2018, 8:42 AM Pager: [phone]

## 2018-02-02 NOTE — Progress Notes (Signed)
02 delivered to pt room prior to pt d/c. Pt had 02 on when discharged, 2/L.

## 2018-02-02 NOTE — Telephone Encounter (Signed)
Hospital f/u per Dr Reesa Chew; pt appt 02/05/2018 145pm/;NW

## 2018-02-02 NOTE — Consult Note (Signed)
[facility] CM Primary Care Navigator  02/02/2018  Mary Jenkins Aug 09, 1956 [id number]   Attempt to seepatient at the bedside to identify possible discharge needsbutshe was alreadydischargedhome today.  Patient was seen and treated for acute respiratory failure, COPD with acute exacerbation.  Providers from primary care provider's office have been following and managing patient during this hospitalization. Primary care provider's office is listed as providing transition of care (TOC).  Patient has discharge instruction to follow up withprimary care provider on 02/05/18.   For additional questions please contact:  Edwena Felty A. [name], BSN, RN-BC [facility] PRIMARY CARE Navigator Cell: [phone]

## 2018-02-02 NOTE — Progress Notes (Signed)
  Date: 02/02/2018  Patient name: Mary Jenkins  Medical record number: [id number]  Date of birth: [birth year]   I have seen and evaluated Mary Jenkins and discussed their care with the Residency Team. Mary Jenkins is a 62 yo female with COPD (FEV1/FVC 31, FEV1 21%, 5% inc with BD, RV 244%, DLCO 24% 2018). Pt failed outpt COPD exac tx, having received doxy and prednisone 3/28, levaquin 4/3, and a 6 day prednisone taper starting 4/25. She acutely worsened prior to admission and was tx with bipap, mag, solumedrol, nebs, and quickly improved. This AM, she feels much better and able to walk in room without dyspnea.   Vitals:   02/02/18 0608 02/02/18 0858  BP: 120/75   Pulse: 80   Resp:    Temp: 97.7 F (36.5 C)   SpO2: 95% 95%  RR 18 GEN NAS, no resp distress, able to speak in full sentences. Still with accessory muscle usage.  HRRR no MRG,  LCTAB with good air flow, no wheezing Ext no edema  I personally viewed the CXR images and confirmed my reading with the official read. AP and lateral, hyperinflated, no infiltrates  I personally viewed the CT lungs and confirmed my reading with the official read. Empyematous changes, primarily upper lobes, few blebs  I personally viewed the EKG and confirmed my reading with the official read. Sinus, RAD, right at cut off for RAE, no ischemic changes   Assessment and Plan: I have seen and evaluated the patient as outlined above. I agree with the formulated Assessment and Plan as detailed in the residents' note, with the following changes:  Mary Jenkins is a 62 yo female with COPD (FEV1/FVC 31, FEV1 21%, 5% inc with BD, RV 244%, DLCO 24% 2018) who has failed outpt tx for a COPD exac and responded very well to inpt treatment. She is stable for D/C.  1. Acute rest failure 2/2 COPD exac - she has shown sig improvement. We will start with a 5 day course of prednisone 40 mg and have her seen on the 17th to assess whether the prednisone needs to be extended. ABX not  needed.  Mary Crews, MD 5/13/201910:19 AM

## 2018-02-02 NOTE — Progress Notes (Signed)
Lungs diminished gave patient Incentive Spirometer reached to 1750 encourage goal 2000 and to use often while awake patient verbalized understanding. Dorrian Doggett LPN

## 2018-02-02 NOTE — Care Management Note (Signed)
Case Management Note  Patient Details  Name: Mary Jenkins MRN: [id number] Date of Birth: 08/08/1956  Subjective/Objective: Pt presented for COPD Exacerbation. PTA from home-pt qualifies for home 02.                    Action/Plan: CM did speak with pt in regards to 02 needs. Pt wants to use [facility] for home 02. CM did call and make referral with Duwaine Maxin- Portable 02 will be delivered to room prior to transition home. No further needs from CM @ this time.   Expected Discharge Date:                  Expected Discharge Plan:  Home/Self Care  In-House Referral:  NA  Discharge planning Services  CM Consult  Post Acute Care Choice:  Durable Medical Equipment Choice offered to:  Patient  DME Arranged:  Oxygen DME Agency:  [facility]  HH Arranged:  NA HH Agency:  NA  Status of Service:  Completed, signed off  If discussed at [place] of Stay Meetings, dates discussed:    Additional Comments:  Bethena Roys, RN 02/02/2018, 11:38 AM

## 2018-02-02 NOTE — Discharge Summary (Signed)
Name: Mary Jenkins MRN: [id number] DOB: 01-03-1956 62 y.o. PCP: Lars Mage, MD  Date of Admission: 02/01/2018  9:28 AM Date of Discharge: 02/02/2018 Attending Physician: Bartholomew Crews, MD  Discharge Diagnosis: Active Problems:   COPD with acute exacerbation [facility])  Discharge Medications: Allergies as of 02/02/2018   No Known Allergies     Medication List    STOP taking these medications   acetaminophen-codeine 300-30 MG tablet Commonly known as:  TYLENOL #3   metroNIDAZOLE 500 MG tablet Commonly known as:  FLAGYL     TAKE these medications   albuterol 108 (90 Base) MCG/ACT inhaler Commonly known as:  VENTOLIN HFA INHALE 2 PUFFS BY MOUTH EVERY 6 HOURS AS NEEDED FOR WHEEZING What changed:    how much to take  how to take this  when to take this  reasons to take this  additional instructions   albuterol (2.5 MG/3ML) 0.083% nebulizer solution Commonly known as:  PROVENTIL USE 3 ML IN NEBULIZER  EVERY 6 HOURS AS NEEDED FOR WHEEZING OR SHORTNESS OF BREATH What changed:  Another medication with the same name was changed. Make sure you understand how and when to take each.   budesonide-formoterol 160-4.5 MCG/ACT inhaler Commonly known as:  SYMBICORT Inhale 2 puffs into the lungs 2 (two) times daily.   ipratropium 0.02 % nebulizer solution Commonly known as:  ATROVENT Inhale 500 mcg into the lungs every 6 (six) hours as needed for wheezing.   omeprazole 20 MG capsule Commonly known as:  PRILOSEC Take 1 capsule (20 mg total) by mouth daily as needed. What changed:  when to take this   predniSONE 20 MG tablet Commonly known as:  DELTASONE Take 2 tablets (40 mg total) by mouth daily with breakfast for 3 days. Start taking on:  02/03/2018 What changed:    medication strength  how much to take  how to take this  when to take this  additional instructions   RESTASIS 0.05 % ophthalmic emulsion Generic drug:  cycloSPORINE Place 2 drops into  both eyes daily.   Tiotropium Bromide Monohydrate 1.25 MCG/ACT Aers Commonly known as:  SPIRIVA RESPIMAT Inhale 2 puffs into the lungs daily. What changed:  Another medication with the same name was removed. Continue taking this medication, and follow the directions you see here.   valACYclovir 1000 MG tablet Commonly known as:  VALTREX TAKE ONE TABLET BY MOUTH TWICE DAILY FOR 3 DAYS AS NEEDED            Durable Medical Equipment  (From admission, onward)        Start     Ordered   02/02/18 1147  For home use only DME oxygen  Once    Comments:  Please supply with Portable Oxygen container (POC)  Question Answer Comment  Mode or (Route) Nasal cannula   Liters per Minute 1   Frequency Continuous (stationary and portable oxygen unit needed)   Oxygen conserving device Yes   Oxygen delivery system Other see comments      02/02/18 1147      Disposition and follow-up:   [name] was discharged from [facility] in Stable condition.  At the hospital follow up visit please address:  1.  Patient admitted with COPD exacerbation. Patient required 1L O2 with ambulation for O2 saturation of 86% on room air, which is a change for her as she was previously asymptomatic and off oxygen prior to admission. She was given prednisone 40 mg  for total duration of 5 days to treat exacerbation on discharge. NO productive cough or infectious symptoms suggesting need for antibiotic.  Please assess for signs and symptoms of respiratory distress. Assess how patient is doing off steroids and evaluate if she would benefit from long taper. Please ambulate with pulse ox to determine if further home O2 is needed.  2.  Labs / imaging needed at time of follow-up: Oxygen level with ambulation, possible discontinuation of hospital home O2 order  3.  Pending labs/ test needing follow-up: HIV antibody  Follow-up Appointments: Follow-up Information    Lars Mage, MD. Go on  02/05/2018.   Specialty:  Internal Medicine Why:  At 1:45 PM Contact information: [address] [address] [place] [place] Follow up.   Why:  Oxygen Contact information: [place] [address] [phone]           Hospital Course by problem list: Active Problems:   COPD with acute exacerbation ([place])   Mary Jenkins is a 62 yo with PMH of COPD (Gold Stage IV, not on home O2), GERD, and breast cancer who presented with acute respiratory distress and clinical signs/symptoms of acute COPD exacerbation. She was admitted to the internal medicine teaching service for management. The specific problems addressed during admission are as follows:  Acute respiratory distress 2/2 COPD exacerbation: Patient presented with 1 week of worsening dyspnea on exertion and increased rescue inhaler use, consistent with acute COPD exacerbation. Patient denied productive cough during this time. She did, however, have productive cough in April that was treated with antibiotics and slow steroid taper by per primary pulmonologist Dr. Melvyn Novas. It appeared on admission that the patient's progressively worsening symptoms coincided with completion of outpatient prednisone taper. At baseline patient well controlled on daily Spiriva and Symbicort inhalers, so report of daily rescue inhaler, represented significant change from baseline. The patient acutely had an episode of respiratory distress the evening of admission that was not responsive to rescue inhalers and required a call to EMS. Patient was placed on BiPAP by EMS and given solumedrol, magnesium, ipratropium, and albuterol en route to the hospital. She improved significantly in the ED and was weaned to room air overnight with scheduled DuoNebs. She required 1L O2 with ambulation for hypoxia to 86% on room air while inpatient, which represented a change for her as she usually does not require home oxygen with daily  inhaler use. Patient discharged with daily prednisone 40 mg for 4 days (last dose 5/15) and orders for home oxygen. Please assess for signs and symptoms of wheezing with increased work of breathing. Would suggest slow taper of prednisone patient still shows signs of increased work of breathing or if she continues to require oxygen with ambulation.  Discharge Vitals:   BP 120/75 (BP Location: Left Arm)   Pulse 80   Temp 97.7 F (36.5 C) (Oral)   Resp 18   Ht 5' 6" (1.676 m)   Wt 98 lb 1.6 oz (44.5 kg)   SpO2 95%   BMI 15.83 kg/m   Pertinent Labs, Studies, and Procedures:   BMP Latest Ref Rng & Units 02/02/2018 02/01/2018 12/15/2017  Glucose 65 - 99 mg/dL 100(H) 131(H) 125(H)  BUN 6 - 20 mg/dL _0 Creatinine 0.44 - 1.00 mg/dL 0.82 0.88 0.97  BUN/Creat Ratio 12 - 28 - - -  Sodium 135 - 145 mmol/L 138 141 139  Potassium  3.5 - 5.1 mmol/L 4.7 3.9 4.1  Chloride 101 - 111 mmol/L 105 108 105  CO2 22 - 32 mmol/L _0 Calcium 8.9 - 10.3 mg/dL 8.4(L) 8.5(L) 8.9   CBC Latest Ref Rng & Units 02/01/2018 12/15/2017 08/04/2017  WBC 4.0 - 10.5 K/uL 7.9 7.5 7.5  Hemoglobin 12.0 - 15.0 g/dL 12.2 12.4 13.6  Hematocrit 36.0 - 46.0 % 38.8 39.1 44.9  Platelets 150 - 400 K/uL 248 261 326   BNP = 35.5 Lactic Acid = 1.06 IStat Troponin = 0.01  Chest X ray, Portable 02/01/2018 FINDINGS: The heart size and mediastinal contours are within normal limits. The lungs are hyperinflated. Both lungs are clear. The visualized skeletal structures are unremarkable.  IMPRESSION: No active cardiopulmonary disease.  Marked COPD.  CT angiography of Chest 02/01/2018 IMPRESSION: No acute finding.  No pulmonary emboli.  Advanced emphysema without focal or active process.  Discharge Instructions: Discharge Instructions    Call MD for:  difficulty breathing, headache or visual disturbances   Complete by:  As directed    Call MD for:  extreme fatigue   Complete by:  As directed    Call MD for:   persistant dizziness or light-headedness   Complete by:  As directed    Call MD for:  temperature >100.4   Complete by:  As directed    Diet - low sodium heart healthy   Complete by:  As directed    Discharge instructions   Complete by:  As directed    You were evaluated and treated for a COPD exacerbation. You were given scheduled breathing treatments and steroids while you were in the hospital, with improvement in your symptoms. You will need to continue taking prednisone 40 mg daily for 3 days after leaving the hospital. You were also found to have a slight drop in your oxygen levels when you ambulate. We have given you supplemental oxygen during your hospitalization to be continued until follow up in the internal medicine clinic, where they can reexamine you to see if you still require this oxygen supplementation.   Increase activity slowly   Complete by:  As directed       Signed: Thomasene Ripple, MD 02/02/2018, 12:05 PM   Pager: [phone]

## 2018-02-02 NOTE — Progress Notes (Signed)
SATURATION QUALIFICATIONS: (This note is used to comply with regulatory documentation for home oxygen)  Patient Saturations on Room Air at Rest = 94%  Patient Saturations on Room Air while Ambulating =86%  Patient Saturations on 1 Liters of oxygen while Ambulating =94%  Please briefly explain why patient needs home oxygen:  Short of breath

## 2018-02-02 NOTE — Progress Notes (Signed)
Patient ready for discharge from unit to home. Family here to provide transport.  All personal belongings with patient.Pt offers no c/o discomfort or distress at this time.   All discharge instructions reviewed with patient including medications, prescriptions, follow up appts.

## 2018-02-02 NOTE — Care Management Obs Status (Signed)
[facility] NOTIFICATION   Patient Details  Name: AUBRE QUINCY MRN: [id number] Date of Birth: 10-Sep-1956   Medicare Observation Status Notification Given:  Yes    Bethena Roys, RN 02/02/2018, 11:49 AM

## 2018-02-03 ENCOUNTER — Other Ambulatory Visit: Payer: Self-pay | Admitting: Pharmacist

## 2018-02-03 DIAGNOSIS — J449 Chronic obstructive pulmonary disease, unspecified: Secondary | ICD-10-CM

## 2018-02-04 NOTE — Telephone Encounter (Signed)
Transition Care Management Follow-up Telephone Call    Date discharged 02/02/2018   How have you been since you were released from the hospital? "I feel good." Denies SHOB, extreme fatigue, dizziness, fever. States she has not needed to use albuterol since hospital discharge.   Do you understand why you were in the hospital? Yes, "My breathing."   Do you understand the discharge instructions? yes   Where were you discharged to? Home   Did you receive any home health or other support care services since discharge from the hospital? no   Did you receive any DME ordered for you at discharge? Yes, O2 from [facility]. Patient is using 2L continuous   Items Reviewed: Discharge AVS reviewed:   Reconciled the current and discharge medications: yes, patient states she's has obtained all discharge meds. Has one more day od prednisone 40 mg in AM  Allergies reviewed: N/A  Dietary changes reviewed: yes, low sodium, heart healthy  Referrals reviewed : not applicable Reviewed upcoming appt with [facility] 04/17/2018 at 1030.    Functional Questionnaire:   Activities of Daily Living (ADLs):   She states they are independent in the following: ambulation, bathing and hygiene, feeding, continence, grooming, toileting and dressing States they require assistance with the following: Nothing   Any transportation issues/concerns?: no, patient has own vehicle   Any patient concerns? no   Confirmed importance and date/time of follow-up visits scheduled: yes, requested she bring all meds with her to visit    Provider Appointment booked with [facility] 02/05/2018 at 1345  Confirmed with patient if condition begins to worsen call PCP or go to the ER.  Patient was given the office and after hours numbers and encouraged to call back with question or concerns: yes  L. Silvano Rusk, RN, BSN

## 2018-02-05 ENCOUNTER — Encounter: Payer: Self-pay | Admitting: Internal Medicine

## 2018-02-05 ENCOUNTER — Ambulatory Visit (INDEPENDENT_AMBULATORY_CARE_PROVIDER_SITE_OTHER): Payer: Medicare Other | Admitting: Internal Medicine

## 2018-02-05 VITALS — BP 117/67 | HR 93 | Wt 102.6 lb

## 2018-02-05 DIAGNOSIS — Z87891 Personal history of nicotine dependence: Secondary | ICD-10-CM

## 2018-02-05 DIAGNOSIS — Z9981 Dependence on supplemental oxygen: Secondary | ICD-10-CM

## 2018-02-05 DIAGNOSIS — J441 Chronic obstructive pulmonary disease with (acute) exacerbation: Secondary | ICD-10-CM | POA: Diagnosis present

## 2018-02-05 MED ORDER — BUDESONIDE-FORMOTEROL FUMARATE 160-4.5 MCG/ACT IN AERO: 2.0000 | INHALATION_SPRAY | Freq: Two times a day (BID) | RESPIRATORY_TRACT | 1 refills | Status: DC

## 2018-02-05 NOTE — Telephone Encounter (Signed)
Thank you for information and for following up with patient.

## 2018-02-05 NOTE — Patient Instructions (Signed)
I am glad to see you feeling better than at the hospital.  Your oxygen continues to get low with exertion so I recommend wearing the supplemental oxygen any time you are up and about.  I do not think we need any more oral steroids or antibiotics at this time.

## 2018-02-05 NOTE — Progress Notes (Signed)
CC: Hospital follow up for COPD exacerbation  HPI:  Mary Jenkins is a 62 y.o. female with PMHx detailed below presenting 3 days after returning home after a 2 day hospital visit for COPD exacerbation with hypoxia. She is not chronically oxygen dependent at home but was hypoxic to 86% at the hospital so home oxygen ordered. She was given a total course of 5 days of 8m prednisone.  See problem based assessment and plan below for additional details.  COPD with acute exacerbation ([place] She had a recent very short admission 5/12-5/13 with hypoxia attributed to COPD exacerbation.  She was previously treated with prednisone and late April and may not have gotten all the way back to baseline. After 5 days of oral steroids she appears much improved however continues to demonstrate oxygen desaturation on exertion.  Ambulatory pulse oximetry in clinic to 85% on room air.  This represents a new oxygen requirement, but may still be worse in the setting of recent insult. Plan: Finished prednisone today Continue portable 2 L/min oxygen except at rest Follow-up with Dr. WMelvyn Novasin July   Past Medical History:  Diagnosis Date  . Breast cancer ([place]   . Cataract, immature    bilateral  . COPD (chronic obstructive pulmonary disease) ([place]    no home O2  . Cough 02/09/2015  . DDD (degenerative disc disease), cervical   . Exertional shortness of breath    states if she "takes her time" doing activities does not get SOB  . Family history of adverse reaction to anesthesia    pt's sister has hx. of post-op N/V  . Full dentures   . GERD (gastroesophageal reflux disease) 02/03/2012  . High cholesterol    no current med.  .[place] KitchenHistory of breast cancer   . Personal history of chemotherapy 1997  . Runny nose 02/09/2015   clear drainage, per pt.    Review of Systems: Review of Systems  Constitutional: Negative for chills and fever.  HENT: Negative for tinnitus.   Eyes: Negative for blurred vision.    Respiratory: Positive for cough and shortness of breath.   Cardiovascular: Negative for palpitations and leg swelling.  Gastrointestinal: Negative for constipation and diarrhea.  Skin: Negative for rash.     Physical Exam: Vitals:   02/05/18 1404  BP: 117/67  Pulse: 93  SpO2: 100%  Weight: 102 lb 9.6 oz (46.5 kg)   GENERAL- alert, co-operative, NAD HEENT- Atraumatic, PERRL, oral mucosa appears moist, no cervical LN enlargement. CARDIAC- RRR, no murmurs, rubs or gallops. RESP- CTAB, no wheezes or crackles, slightly prolonged expiratory phase with normal WOB. EXTREMITIES- pulse 2+, symmetric, no pedal edema. SKIN- Warm, dry, No rash or lesion. PSYCH- Normal mood and affect, appropriate thought content and speech.   Assessment & Plan:   See encounters tab for problem based medical decision making.   Patient discussed with Dr. EAngelia Mould

## 2018-02-05 NOTE — Assessment & Plan Note (Signed)
She had a recent very short admission 5/12-5/13 with hypoxia attributed to COPD exacerbation.  She was previously treated with prednisone and late April and may not have gotten all the way back to baseline. After 5 days of oral steroids she appears much improved however continues to demonstrate oxygen desaturation on exertion.  Ambulatory pulse oximetry in clinic to 85% on room air.  This represents a new oxygen requirement, but may still be worse in the setting of recent insult. Plan: Finished prednisone today Continue portable 2 L/min oxygen except at rest Follow-up with Dr. Melvyn Novas in July

## 2018-02-09 NOTE — Progress Notes (Signed)
Internal Medicine Clinic Attending  Case discussed with Dr. Benjamine Mola at the time of the visit.  We reviewed the resident's history and exam and pertinent patient test results.  I agree with the assessment, diagnosis, and plan of care documented in the resident's note.

## 2018-02-26 ENCOUNTER — Telehealth: Payer: Self-pay | Admitting: Internal Medicine

## 2018-02-26 NOTE — Telephone Encounter (Signed)
Patient is requesting a new Pain Clinic referral.  Pt has not been seen in their clinic for over 3 months, therefore she must have a new referral placed to go back to their office.  Please advise if she would  Need to be seen again in clinic before a referral can be placed.

## 2018-02-26 NOTE — Telephone Encounter (Signed)
Yes, please schedule patient for a clinic visit to get referral. Thanks!  Mary Jenkins

## 2018-02-27 ENCOUNTER — Ambulatory Visit: Payer: Medicare Other | Admitting: Internal Medicine

## 2018-03-04 NOTE — Telephone Encounter (Signed)
Pt prefers to wait to see you on 05/01/2018 to be re-referred to the pain clinic. Pt is okay with waiting until then.

## 2018-03-05 NOTE — Telephone Encounter (Signed)
The patient was already given script for pain medication for 3 months. Would she prefer to go to pain clinic? Do we know why?

## 2018-03-23 ENCOUNTER — Other Ambulatory Visit: Payer: Self-pay | Admitting: Internal Medicine

## 2018-03-23 NOTE — Telephone Encounter (Signed)
Spoke with the patient again.  Pt's states she prefers her injections [facility] was getting [facility]  Per Dr. Si Gaul office patient must a have a new Referral placed in order to continue getting these injections through their clinic.

## 2018-04-07 ENCOUNTER — Other Ambulatory Visit: Payer: Self-pay | Admitting: Internal Medicine

## 2018-04-07 DIAGNOSIS — Z1231 Encounter for screening mammogram for malignant neoplasm of breast: Secondary | ICD-10-CM

## 2018-04-13 ENCOUNTER — Ambulatory Visit (INDEPENDENT_AMBULATORY_CARE_PROVIDER_SITE_OTHER): Payer: Medicare Other | Admitting: Adult Health

## 2018-04-13 ENCOUNTER — Encounter: Payer: Self-pay | Admitting: Adult Health

## 2018-04-13 DIAGNOSIS — J9611 Chronic respiratory failure with hypoxia: Secondary | ICD-10-CM

## 2018-04-13 DIAGNOSIS — J9621 Acute and chronic respiratory failure with hypoxia: Secondary | ICD-10-CM | POA: Insufficient documentation

## 2018-04-13 DIAGNOSIS — J449 Chronic obstructive pulmonary disease, unspecified: Secondary | ICD-10-CM

## 2018-04-13 NOTE — Progress Notes (Signed)
Chart and office note reviewed in detail  > agree with a/p as outlined

## 2018-04-13 NOTE — Assessment & Plan Note (Signed)
COPD is stable on current regimen. Pneumovax and Prevnar vaccines are up-to-date  Plan  Patient Instructions  Continue on Symbicort 2 puffs twice daily, rinse after use Continue on Spiriva daily Continue on oxygen. Continue with Ensure between meals Activity as tolerated Follow-up with Dr. Melvyn Novas in 3 to 4 months and as needed

## 2018-04-13 NOTE — Progress Notes (Signed)
_0  ID: Mary Jenkins, female    DOB: 1956/02/06, 62 y.o.   MRN: [id number]  Chief Complaint  Patient presents with  . Follow-up    COPD     Referring provider: Lars Mage, MD  HPI: 62 year old female former smoker quit 2012 followed for gold for COPD and oxygen dependent respiratory failure  TEST   FEV1 0.39 ( 16%) ratio 23 and 16% better p B2    - Alpha one genotype 02/13/12 > MM   - 03/22/2013  PFT's 0.56 ( 24%) ratio 33 but 12% better improvement and DLCO 21 corrects to 28%  -Spirometry  05/31/2016  FEV1 0.52 (23%)  Ratio 32   - PFT's  08/29/17   FEV1 0.51 (22 % ) ratio 32  p 5 % improvement from saba p ? prior to study with DLCO  24 % corrects to 33 % for alv volume   CT chest may 2019 showed advanced emphysema with no acute process  04/13/2018 Follow up : COPD , O2 RF  Patient returns for a 33-monthfollow-up.  Patient says overall that her breathing is doing about the same.  She gets winded with heavy activity.  She did have a hospitalization in May of this year for COPD exacerbation was treated with steroids and seemed to have clinical improvement.  She remains on Symbicort and Spiriva.  She denies any increased cough or wheezing as of late.  She remains on oxygen 2 to 3 L.. Says that her weight is been stable.  She is using Ensure between meals.    No Known Allergies  Immunization History  Administered Date(s) Administered  . H1N1 06/14/2011  . Influenza Split 06/14/2011, 07/21/2012  . Influenza, Seasonal, Injecte, Preservative Fre 06/24/2015  . Influenza,inj,Quad PF,6+ Mos 07/05/2013, 06/06/2014, 06/02/2015, 05/31/2016, 06/16/2017  . Pneumococcal Conjugate-13 10/14/2011  . Pneumococcal Polysaccharide-23 05/31/2016  . Tdap 02/03/2012, 08/14/2015    Past Medical History:  Diagnosis Date  . [place]   . Cataract, immature    bilateral  . COPD [place] disease) [place]    no home O2  . Cough 02/09/2015  . DDD (degenerative disc  disease), cervical   . Exertional shortness of breath    states if she "takes her time" doing activities does not get SOB  . Family history of adverse reaction to anesthesia    pt's sister has hx. of post-op N/V  . Full dentures   . GERD (gastroesophageal reflux disease) 02/03/2012  . High cholesterol    no current med.  .Marland KitchenHistory of breast cancer   . Personal history of chemotherapy 1997  . Runny nose 02/09/2015   clear drainage, per pt.    Tobacco History: Social History   Tobacco Use  Smoking Status Former Smoker  . Last attempt to quit: 07/23/2012  . Years since quitting: 5.7  Smokeless Tobacco Never Used   Counseling given: Not Answered   Outpatient Medications Prior to Visit  Medication Sig Dispense Refill  . albuterol (PROVENTIL) (2.5 MG/3ML) 0.083% nebulizer solution USE 3 ML IN NEBULIZER  EVERY 6 HOURS AS NEEDED FOR WHEEZING OR SHORTNESS OF BREATH 120 mL 11  . albuterol (VENTOLIN HFA) 108 (90 Base) MCG/ACT inhaler INHALE 2 PUFFS BY MOUTH EVERY 6 HOURS AS NEEDED FOR WHEEZING (Patient taking differently: Inhale 2 puffs into the lungs every 6 (six) hours as needed for wheezing. ) 54 each 3  . budesonide-formoterol (SYMBICORT) 160-4.5 MCG/ACT inhaler Inhale 2 puffs into the lungs 2 (two)  times daily. 6 g 1  . ipratropium (ATROVENT) 0.02 % nebulizer solution Inhale 500 mcg into the lungs every 6 (six) hours as needed for wheezing.    Marland Kitchen omeprazole (PRILOSEC) 20 MG capsule Take 1 capsule (20 mg total) by mouth daily as needed. (Patient taking differently: Take 20 mg by mouth at bedtime. ) 90 capsule 0  . RESTASIS 0.05 % ophthalmic emulsion Place 2 drops into both eyes daily.     . Tiotropium Bromide Monohydrate (SPIRIVA RESPIMAT) 1.25 MCG/ACT AERS Inhale 2 puffs into the lungs daily. 1 Inhaler 11  . valACYclovir (VALTREX) 1000 MG tablet TAKE ONE TABLET BY MOUTH TWICE DAILY FOR 3 DAYS AS NEEDED 30 tablet 5   No facility-administered medications prior to visit.      Review of  Systems  Constitutional:   No  weight loss, night sweats,  Fevers, chills +, fatigue, or  lassitude.  HEENT:   No headaches,  Difficulty swallowing,  Tooth/dental problems, or  Sore throat,                No sneezing, itching, ear ache, nasal congestion, post nasal drip,   CV:  No chest pain,  Orthopnea, PND, swelling in lower extremities, anasarca, dizziness, palpitations, syncope.   GI  No heartburn, indigestion, abdominal pain, nausea, vomiting, diarrhea, change in bowel habits, loss of appetite, bloody stools.   Resp: No chest wall deformity  Skin: no rash or lesions.  GU: no dysuria, change in color of urine, no urgency or frequency.  No flank pain, no hematuria   MS:  No joint pain or swelling.  No decreased range of motion.  No back pain.    Physical Exam  BP 110/60 (BP Location: Left Arm, Cuff Size: Normal)   Pulse 77   Ht _0  (1.676 m)   Wt 96 lb 9.6 oz (43.8 kg)   SpO2 93%   BMI 15.59 kg/m   GEN: A/Ox3; pleasant , NAD, thin frail female    HEENT:  [place]/AT,  EACs-clear, TMs-wnl, NOSE-clear, THROAT-clear, no lesions, no postnasal drip or exudate noted.   NECK:  Supple w/ fair ROM; no JVD; normal carotid impulses w/o bruits; no thyromegaly or nodules palpated; no lymphadenopathy.    RESP  Decreased BS in bases , no accessory muscle use, no dullness to percussion  CARD:  RRR, no m/r/g, no peripheral edema, pulses intact, no cyanosis or clubbing.  GI:   Soft & nt; nml bowel sounds; no organomegaly or masses detected.   Musco: Warm bil, no deformities or joint swelling noted.   Neuro: alert, no focal deficits noted.    Skin: Warm, no lesions or rashes    Lab Results:  CBC    Component Value Date/Time   WBC 7.9 02/01/2018 0945   RBC 4.60 02/01/2018 0945   HGB 12.2 02/01/2018 0945   HGB 13.6 08/04/2017 0845   HCT 38.8 02/01/2018 0945   HCT 44.9 08/04/2017 0845   PLT 248 02/01/2018 0945   PLT 326 08/04/2017 0845   MCV 84.3 02/01/2018 0945   MCV 87  08/04/2017 0845   MCH 26.5 02/01/2018 0945   MCHC 31.4 02/01/2018 0945   RDW 17.3 (H) 02/01/2018 0945   RDW 18.1 (H) 08/04/2017 0845   LYMPHSABS 2.4 02/01/2018 0945   MONOABS 0.6 02/01/2018 0945   EOSABS 0.3 02/01/2018 0945   BASOSABS 0.0 02/01/2018 0945    BMET    Component Value Date/Time   NA 138 02/02/2018 0638   NA  143 08/04/2017 0845   K 4.7 02/02/2018 0638   CL 105 02/02/2018 0638   CO2 24 02/02/2018 0638   GLUCOSE 100 (H) 02/02/2018 0638   BUN 10 02/02/2018 0638   BUN 12 08/04/2017 0845   CREATININE 0.82 02/02/2018 0638   CREATININE 1.00 09/12/2015 1723   CALCIUM 8.4 (L) 02/02/2018 0638   GFRNONAA >60 02/02/2018 0638   GFRNONAA 73 12/07/2014 1130   GFRAA >60 02/02/2018 0638   GFRAA 84 12/07/2014 1130    BNP    Component Value Date/Time   BNP 35.5 02/01/2018 0945    ProBNP    Component Value Date/Time   PROBNP 96.9 06/14/2011 0240    Imaging: No results found.   Assessment & Plan:   COPD GOLD IV COPD is stable on current regimen. Pneumovax and Prevnar vaccines are up-to-date  Plan  Patient Instructions  Continue on Symbicort 2 puffs twice daily, rinse after use Continue on Spiriva daily Continue on oxygen. Continue with Ensure between meals Activity as tolerated Follow-up with Dr. Melvyn Novas in 3 to 4 months and as needed      Chronic respiratory failure with hypoxia ([place]) Cont on O2      Tammy Parrett, NP 04/13/2018

## 2018-04-13 NOTE — Patient Instructions (Addendum)
Continue on Symbicort 2 puffs twice daily, rinse after use Continue on Spiriva daily Continue on oxygen. Continue with Ensure between meals Activity as tolerated Follow-up with Dr. Melvyn Novas in 3 to 4 months and as needed

## 2018-04-13 NOTE — Assessment & Plan Note (Signed)
Cont on O2

## 2018-04-17 ENCOUNTER — Ambulatory Visit: Payer: Medicare Other | Admitting: Adult Health

## 2018-05-01 ENCOUNTER — Encounter: Payer: Medicare Other | Admitting: Internal Medicine

## 2018-05-06 ENCOUNTER — Ambulatory Visit: Payer: Medicare Other

## 2018-05-11 ENCOUNTER — Ambulatory Visit
Admission: RE | Admit: 2018-05-11 | Discharge: 2018-05-11 | Disposition: A | Discharge status: Home / Self Care | Payer: Medicare Other | Source: Ambulatory Visit | Attending: Internal Medicine | Admitting: Internal Medicine

## 2018-05-11 DIAGNOSIS — Z1231 Encounter for screening mammogram for malignant neoplasm of breast: Secondary | ICD-10-CM

## 2018-05-22 ENCOUNTER — Encounter: Payer: Self-pay | Admitting: Internal Medicine

## 2018-05-22 ENCOUNTER — Other Ambulatory Visit: Payer: Self-pay

## 2018-05-22 ENCOUNTER — Ambulatory Visit (INDEPENDENT_AMBULATORY_CARE_PROVIDER_SITE_OTHER): Payer: Medicare Other | Admitting: Internal Medicine

## 2018-05-22 VITALS — BP 125/86 | HR 89 | Temp 98.3°F | Ht 66.0 in | Wt 100.2 lb

## 2018-05-22 DIAGNOSIS — K219 Gastro-esophageal reflux disease without esophagitis: Secondary | ICD-10-CM

## 2018-05-22 DIAGNOSIS — M4722 Other spondylosis with radiculopathy, cervical region: Secondary | ICD-10-CM | POA: Diagnosis not present

## 2018-05-22 DIAGNOSIS — Z9981 Dependence on supplemental oxygen: Secondary | ICD-10-CM

## 2018-05-22 DIAGNOSIS — Z7951 Long term (current) use of inhaled steroids: Secondary | ICD-10-CM | POA: Diagnosis not present

## 2018-05-22 DIAGNOSIS — J449 Chronic obstructive pulmonary disease, unspecified: Secondary | ICD-10-CM

## 2018-05-22 DIAGNOSIS — M4802 Spinal stenosis, cervical region: Secondary | ICD-10-CM | POA: Diagnosis not present

## 2018-05-22 DIAGNOSIS — E785 Hyperlipidemia, unspecified: Secondary | ICD-10-CM | POA: Diagnosis not present

## 2018-05-22 DIAGNOSIS — Z0000 Encounter for general adult medical examination without abnormal findings: Secondary | ICD-10-CM

## 2018-05-22 DIAGNOSIS — M47812 Spondylosis without myelopathy or radiculopathy, cervical region: Secondary | ICD-10-CM

## 2018-05-22 DIAGNOSIS — Z79899 Other long term (current) drug therapy: Secondary | ICD-10-CM

## 2018-05-22 DIAGNOSIS — Z23 Encounter for immunization: Secondary | ICD-10-CM

## 2018-05-22 MED ORDER — PREGABALIN 75 MG PO CAPS: 75.0000 mg | ORAL_CAPSULE | Freq: Two times a day (BID) | ORAL | 0 refills | Status: DC

## 2018-05-22 MED ORDER — BUDESONIDE-FORMOTEROL FUMARATE 160-4.5 MCG/ACT IN AERO: 2.0000 | INHALATION_SPRAY | Freq: Two times a day (BID) | RESPIRATORY_TRACT | 1 refills | Status: DC

## 2018-05-22 NOTE — Patient Instructions (Addendum)
It was a pleasure to see you today Ms. Mary Jenkins. Please make the following changes:  -Continue taking spiriva and symbicort -Please start taking lyrica 61m twice daily for your neck pain, if you do not find relief in a month please let me know and we can talk about other treatment options  If you have any questions or concerns, please call our clinic at [phone] 9am-5pm and after hours call [phone] and ask for the internal medicine resident on call. If you feel you are having a medical emergency please call 911.   Thank you, we look forward to help you remain healthy!  VLars Mage MD Internal Medicine PGY2

## 2018-05-22 NOTE — Progress Notes (Signed)
CC: Neck pain (Cervical spine arthtitis)  HPI:  [name] is a 62 y.o. with COPD, GERD, dyslipidemia presents for COPD follow-up. with past medical history documented below presents for neck pain. Please see problem based charting for evaluation, assessment, and plan.  Past Medical History:  Diagnosis Date  . Breast cancer ([place])   . Cataract, immature    bilateral  . COPD (chronic obstructive pulmonary disease) ([place])    no home O2  . Cough 02/09/2015  . DDD (degenerative disc disease), cervical   . Exertional shortness of breath    states if she "takes her time" doing activities does not get SOB  . Family history of adverse reaction to anesthesia    pt's sister has hx. of post-op N/V  . Full dentures   . GERD (gastroesophageal reflux disease) 02/03/2012  . High cholesterol    no current med.  Marland Kitchen History of breast cancer   . Personal history of chemotherapy 1997  . Runny nose 02/09/2015   clear drainage, per pt.   Review of Systems:    Review of Systems  Constitutional: Negative for chills and fever.  Eyes: Negative for blurred vision and double vision.  Respiratory: Negative for cough and shortness of breath.   Cardiovascular: Negative for palpitations.  Gastrointestinal: Negative for abdominal pain, nausea and vomiting.  Neurological: Negative for dizziness.  Psychiatric/Behavioral: The patient is not nervous/anxious.    Physical Exam:  Vitals:   05/22/18 1457  BP: 125/86  Pulse: 89  Temp: 98.3 F (36.8 C)  TempSrc: Oral  SpO2: 99%  Weight: 100 lb 3.2 oz (45.5 kg)  Height: 5' 6" (1.676 m)   Physical Exam  Constitutional: She appears well-developed and well-nourished. No distress.  HENT:  Head: Normocephalic and atraumatic.  Eyes: Conjunctivae are normal.  Cardiovascular: Normal rate, regular rhythm and normal heart sounds.  Respiratory: Effort normal and breath sounds normal. No respiratory distress. She has no wheezes.  GI: Soft. Bowel sounds are  normal. There is no tenderness.  Musculoskeletal: She exhibits tenderness (to palpation at cervical spine). She exhibits no edema.  5/5 bilateral upper extremity strength  Neurological: She is alert.  Skin: She is not diaphoretic. No erythema.  Psychiatric: She has a normal mood and affect. Her behavior is normal. Judgment and thought content normal.     Assessment & Plan:   See Encounters Tab for problem based charting.  Patient discussed with Dr. Rebeca Alert

## 2018-05-23 DIAGNOSIS — Z0000 Encounter for general adult medical examination without abnormal findings: Secondary | ICD-10-CM | POA: Insufficient documentation

## 2018-05-23 NOTE — Assessment & Plan Note (Signed)
Patient received her influenza vaccine during this visit DME supples nasal cannula

## 2018-05-23 NOTE — Assessment & Plan Note (Signed)
Patient states that she has pain in the center of her neck to shoulders down left arm. She states that the pain is usually ranging 7-10/10 intensity, sharp in nature, constant, worsened when she moves her neck in either direction. She has accompanied left upper extremity numbness and tingling.   The patient's last imaging was done in October 2016 which showed left foraminal enroachment and spinal stenosis at the c3-c4 level, spinal stenosis at c5-c6 level  Patient was seen by PM&R last year (2018) and she stated that she had pain relief for several months after she was given a spinal injection. During previous visit the patient was also given lyrica to use, but this script was cancelled and the patient did not start using it as she had benefit from the injection.   The patient returns to clinic today asking about whether she can be re-referred clinic so that she can get another injection.   Assessment and plan  The patient patient is continuing to have neck pain and numbness and tingling which is consistent with cervical spine spondylosis. Spoke to the patient about trying lyrica as a sustainable solution for her pain. She said that she is willing to try it.   -Prescribed lyrica 75g bid

## 2018-05-23 NOTE — Assessment & Plan Note (Signed)
Seen by her pulmonologist in July 2019 during which time she was told to continue Symbicort 2 puffs twice daily, Spiriva daily, oxygen at 2 to 3 L, and continue activity as tolerated.  She has a follow-up with them in October 2019.  Assessment and plan  The patient is breathing comfortably and doing well without any shortness of breath or chest pain. The patient should continue using oxygen, symbicort and spiriva.

## 2018-05-24 NOTE — Progress Notes (Signed)
Internal Medicine Clinic Attending  Case discussed with Dr. Maricela Bo at the time of the visit.  We reviewed the resident's history and exam and pertinent patient test results.  I agree with the assessment, diagnosis, and plan of care documented in the resident's note.  Lenice Pressman, M.D., Ph.D.

## 2018-06-12 ENCOUNTER — Telehealth: Payer: Self-pay | Admitting: Internal Medicine

## 2018-06-18 ENCOUNTER — Other Ambulatory Visit: Payer: Self-pay | Admitting: Internal Medicine

## 2018-06-18 DIAGNOSIS — J449 Chronic obstructive pulmonary disease, unspecified: Secondary | ICD-10-CM

## 2018-06-18 NOTE — Telephone Encounter (Signed)
Pt requesting a Smaller Oxygen Carrier.  Patient states the one she has is too heavy and she can not tote it around.  Pt needs a New DME Order to be placed for a Smaller Portable Tank Unit Assessment .  Please advise.

## 2018-06-18 NOTE — Progress Notes (Signed)
dme order for small oxygen tank  Mary Mage, MD Internal Medicine PGY2 [id number]:[phone] 06/18/2018, 11:31 PM

## 2018-06-22 NOTE — Telephone Encounter (Signed)
I have updated the order. Thank you!

## 2018-06-22 NOTE — Telephone Encounter (Signed)
[facility] has called back in regards to the order you placed for a smaller Unit.  The pt is requesting a specific unit.  They would like for your order to be updated to [facility] Fit Portable Oxygen Concentrator.  Also your order must state the reason the pt requires a smaller unit in the Comment section.  Please advise if you can update your orders.

## 2018-06-23 NOTE — Telephone Encounter (Signed)
Thanks!  I'll refax the new Order.

## 2018-07-14 ENCOUNTER — Ambulatory Visit (INDEPENDENT_AMBULATORY_CARE_PROVIDER_SITE_OTHER): Payer: Medicare Other | Admitting: Internal Medicine

## 2018-07-14 ENCOUNTER — Encounter: Payer: Self-pay | Admitting: Internal Medicine

## 2018-07-14 VITALS — BP 104/68 | HR 83 | Ht 66.0 in | Wt 104.0 lb

## 2018-07-14 DIAGNOSIS — J449 Chronic obstructive pulmonary disease, unspecified: Secondary | ICD-10-CM

## 2018-07-14 DIAGNOSIS — J9611 Chronic respiratory failure with hypoxia: Secondary | ICD-10-CM | POA: Diagnosis not present

## 2018-07-14 MED ORDER — TIOTROPIUM BROMIDE MONOHYDRATE 2.5 MCG/ACT IN AERS: 2.0000 | INHALATION_SPRAY | Freq: Every day | RESPIRATORY_TRACT | 0 refills | Status: DC

## 2018-07-14 MED ORDER — OMEPRAZOLE 20 MG PO CPDR: 20.0000 mg | DELAYED_RELEASE_CAPSULE | Freq: Two times a day (BID) | ORAL | 2 refills | Status: DC

## 2018-07-14 MED ORDER — TIOTROPIUM BROMIDE MONOHYDRATE 2.5 MCG/ACT IN AERS: 2.0000 | INHALATION_SPRAY | Freq: Every day | RESPIRATORY_TRACT | 11 refills | Status: DC

## 2018-07-14 NOTE — Patient Instructions (Addendum)
Spiriva should be 4 puffs of the blue or 2 pffs of the green  Tipped  spiriva each am  Prednisone 10 mg take  4 each am x 2 days,   2 each am x 2 days,  1 each am x 2 days and stop     Continue omeprazole 20 mg Take 30-60 min before first meal of the day but add another dose 30 min supper for respiratory flares   Plan B = Backup Only use your albuterol (ventolin) as a rescue medication to be used if you can't catch your breath by resting or doing a relaxed purse lip breathing pattern.  - The less you use it, the better it will work when you need it. - Ok to use the inhaler up to 2 puffs  every 4 hours if you must but call for appointment if use goes up over your usual need - Don't leave home without it !!  (think of it like the spare tire for your car)   Plan C = Crisis - only use your albuterol nebulizer if you first try Plan B and it fails to help > ok to use the nebulizer up to every 4 hours but if start needing it regularly call for immediate appointment    Please schedule a follow up visit in 3 months but call sooner if needed

## 2018-07-14 NOTE — Assessment & Plan Note (Addendum)
Marland Kitchen

## 2018-07-14 NOTE — Assessment & Plan Note (Signed)
PFT performed on 06/27/2011. Poor quality.    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 prior to study with DLCO  24 % corrects to 33 % for alv volume    - referred to rehab again 11/26/2017 > could not afford it   - 07/14/2018  After extensive coaching inhaler device,  effectiveness =    75% (short Ti)    Getting confused with different strengths / names of meds but still relatively well compensated on present rx for COPD Group D in terms of symptom/risk and laba/lama/ICS  therefore appropriate rx at this point> continue symb 160 2bid and change spiriva to the full strength  ? Having mild flare related to URI exp (doublt s more upper airway symptoms) rx prednisone x 6 days to have on hand.

## 2018-07-14 NOTE — Progress Notes (Signed)
Subjective:    Patient ID: Mary Jenkins, female    DOB: 1956-06-24  MRN: [id number]    Brief patient profile:   73 yobf quit smoking  07/24/11 not taking breathing medications at that point and subsequently placed on multiple meds and still sob just getting dressed so referred from the medicine clinic at [facility] 02/13/2012 for pulmonary eval with documented GOLD IV COPD 02/2013     Brief patient profile:  02/13/2012 1st pulmonary cc progressive worse x 6 months doe x 50-100 ft can't do a grocery store where could do before quit. No real variability, cough is better with no excess or purulent sputum.  Not using saba daytime because finds if she's holding still doesn't need it as oftern. Some better p last  Prednisone rx. Not much variability. rec Stop atrovent and advair Start symbiocort Take 2 puffs first thing in am and then another 2 puffs about 12 hours later.  Take after only am dose Spiriva Protonix 40 mg Take 30-60 min before first meal of the day  GERD diet Only use your albuterol (Plan B= ventolin puffer,  Plan C is nebulizer) as a rescue medication to be used if you can't catch your breath by resting or doing a relaxed purse lip breathing pattern. The less you use it, the better it will work when you need it. Ok to use rescue up to every 4 hours if doing poorly.       12/18/2017  f/u ov/Damian Buckles re:  COPD IV/ symb/ spiriva tapering pred  Chief Complaint  Patient presents with  . Follow-up    SOB with activity, productive cough -yellow thick mucus, slight wheezing at night more   Dyspnea:  MMRC3 = can't walk 100 yards even at a slow pace at a flat grade s stopping due to sob  Cough: congested cough  Sleep: ok flat most noct SABA use:  Not using  In any form  Rec Work on inhaler technique:  Finish prednisone as you  plan  For cough > mucinex dm up to 1200 mg daily and use flutter valve as much as possible  Whenever cough flares should be on priloec 20 mg Take 30- 60 min before your  first and last meals of the day then ok when cough better for a week to leave the prilosec back off  Please schedule a follow up office visit in 4 weeks, sooner if needed  with all medications /inhalers/ solutions in hand so we can verify exactly what you are taking. This includes all medications from all doctors and over the counters     01/15/2018  f/u ov/Aileen Amore re: copd iv/ did not bring meds  Chief Complaint  Patient presents with  . Follow-up    Reports increased SOB since weather has changed. Reports her daughter used some kind of incense mist and her breathing has been worse every since. No improvement noted with neb or inhaler.   Dyspnea:  Still MMRC3 = can't walk 100 yards even at a slow pace at a flat grade s stopping due to sob   Cough: worse with certain foods / exp to incense / non prod Sleep:   No 02 / flat ok  SABA use:  Last used 2 weeks prior to OV   Feels much better on prednisone  And worse off it  Taking ppi hs       07/14/2018  f/u ov/Cully Luckow re:  Copd GOLD IV/ 02 dep / ? slt flare p uri  exp  Chief Complaint  Patient presents with  . Follow-up    Increased SOB recently- relates to her grandson having a cold. She has some chest tightness. She is using her albuterol inhaler 2 x daily and rarely use her neb.    Dyspnea:  Foodlion on 3lpm poc but does not check sats Cough: min cough / nothing purulent Sleeping: flat / no pillows SABA use: as above/ chest tightness better p saba though hfa ti too short - see a/p  02: 2lpm at hs / 3lpm poc  / going back to school and present POC too noisy so turns it off then   No obvious day to day or daytime variability or assoc excess/ purulent sputum or mucus plugs or hemoptysis or cp,  subjective wheeze or overt sinus or hb symptoms.   Sleeping as above without nocturnal  or early am exacerbation  of respiratory  c/o's or need for noct saba. Also denies any obvious fluctuation of symptoms with weather or environmental changes or other  aggravating or alleviating factors except as outlined above   No unusual exposure hx or h/o childhood pna/ asthma or knowledge of premature birth.  Current Allergies, Complete Past Medical History, Past Surgical History, Family History, and Social History were reviewed in [employer] record.  ROS  The following are not active complaints unless bolded Hoarseness, sore throat, dysphagia, dental problems, itching, sneezing,  nasal congestion or discharge of excess mucus or purulent secretions, ear ache,   fever, chills, sweats, unintended wt loss or wt gain, classically pleuritic or exertional cp,  orthopnea pnd or arm/hand swelling  or leg swelling, presyncope, palpitations, abdominal pain, anorexia, nausea, vomiting, diarrhea  or change in bowel habits or change in bladder habits, change in stools or change in urine, dysuria, hematuria,  rash, arthralgias, visual complaints, headache, numbness, weakness or ataxia or problems with walking or coordination,  change in mood or  memory.        Current Meds -  - NOTE:   Unable to verify as accurately reflecting what pt takes     Medication Sig  . albuterol (PROVENTIL) (2.5 MG/3ML) 0.083% nebulizer solution USE 3 ML IN NEBULIZER  EVERY 6 HOURS AS NEEDED FOR WHEEZING OR SHORTNESS OF BREATH  . albuterol (VENTOLIN HFA) 108 (90 Base) MCG/ACT inhaler INHALE 2 PUFFS BY MOUTH EVERY 6 HOURS AS NEEDED FOR WHEEZING (Patient taking differently: Inhale 2 puffs into the lungs every 6 (six) hours as needed for wheezing. )  . budesonide-formoterol (SYMBICORT) 160-4.5 MCG/ACT inhaler Inhale 2 puffs into the lungs 2 (two) times daily.  Marland Kitchen omeprazole (PRILOSEC) 20 MG capsule Take 1 capsule (20 mg total) by mouth 2 (two) times daily before a meal.  . OXYGEN 2lpm with rest and 3 with exertion  . pregabalin (LYRICA) 75 MG capsule Take 1 capsule (75 mg total) by mouth 2 (two) times daily.  . RESTASIS 0.05 % ophthalmic emulsion Place 2 drops into both eyes  daily.   . valACYclovir (VALTREX) 1000 MG tablet TAKE ONE TABLET BY MOUTH TWICE DAILY FOR 3 DAYS AS NEEDED  .     . [  omeprazole (PRILOSEC) 20 MG capsule Take 1 capsule (20 mg total) by mouth daily as needed. (Patient taking differently: Take 20 mg by mouth at bedtime. )  . [ Tiotropium Bromide Monohydrate (SPIRIVA RESPIMAT) 1.25 MCG/ACT AERS Inhale 2 puffs into the lungs daily.  Objective:   Physical Exam   thin amb bf nad    Vital signs reviewed - Note on arrival 02 sats  98% on 3 lpm poc   And 95% RA   Wt 120 02/13/2012  > 119 03/13/2012 > 04/22/2012 117 > 07/21/2012 122> 12/02/2012  131> 03/22/2013  131 > 04/19/2013  129  > 07/19/2013 131 > 06/06/2014 129 >  11/11/2014  132> 02/28/2015   129 > 01/16/2016  115 >  02/29/2016  110 > 05/31/2016  115  > 11/21/2016   117 > 02/21/2017   107 > 06/16/2017 102 > 08/25/2017   106 > 11/26/2017 99 > 12/18/2017 >  01/15/2018  98 > 07/14/2018 104     HEENT: Full dentures/ nl oropharynx. Nl external ear canals without cough reflex -  Mild bilateral non-specific turbinate edema     NECK :  without JVD/Nodes/TM/ nl carotid upstrokes bilaterally   LUNGS: no acc muscle use,  Mod barrel  contour chest wall with bilateral  Distant bs s audible wheeze and  without cough on insp or exp maneuver and mod  Hyperresonant  to  percussion bilaterally     CV:  RRR  no s3 or murmur or increase in P2, and no edema   ABD:  soft and nontender with pos mid  insp Hoover's  in the supine position. No bruits or organomegaly appreciated, bowel sounds nl  MS:   Nl gait/  ext warm without deformities, calf tenderness, cyanosis or clubbing No obvious joint restrictions   SKIN: warm and dry without lesions    NEURO:  alert, approp, nl sensorium with  no motor or cerebellar deficits apparent.          Assessment & Plan:

## 2018-07-14 NOTE — Assessment & Plan Note (Addendum)
rec as of 07/14/2018  = 2lpm hs and 3lpm poc with activity but resting sats RA = 95% so no need at rest      I had an extended discussion with the patient reviewing all relevant studies completed to date and  lasting 15 to 20 minutes of a 25 minute visit    See device teaching which extended face to face time for this visit.  Each maintenance medication was reviewed in detail including emphasizing most importantly the difference between maintenance and prns and under what circumstances the prns are to be triggered using an action plan format that is not reflected in the computer generated alphabetically organized AVS which I have not found useful in most complex patients, especially with respiratory illnesses  Please see AVS for specific instructions unique to this visit that I personally wrote and verbalized to the the pt in detail and then reviewed with pt  by my nurse highlighting any  changes in therapy recommended at today's visit to their plan of care

## 2018-07-16 ENCOUNTER — Telehealth: Payer: Self-pay | Admitting: Internal Medicine

## 2018-07-16 MED ORDER — PREDNISONE 10 MG PO TABS: ORAL_TABLET | ORAL | 0 refills | Status: DC

## 2018-07-16 NOTE — Telephone Encounter (Signed)
Checked pt's OV yesterday, 07/14/18 with MW to see if he had sent any meds in for pt. MW had sent Rx omeprazole for pt but nothing was sent for prednisone.  I verified the instructions for the prednisone taper and sent Rx in to pt's pharmacy.  Called pt to let her know this had been done. Pt expressed understanding. Nothing further needed.

## 2018-08-03 ENCOUNTER — Other Ambulatory Visit: Payer: Self-pay | Admitting: *Deleted

## 2018-08-03 DIAGNOSIS — J449 Chronic obstructive pulmonary disease, unspecified: Secondary | ICD-10-CM

## 2018-08-03 MED ORDER — BUDESONIDE-FORMOTEROL FUMARATE 160-4.5 MCG/ACT IN AERO: 2.0000 | INHALATION_SPRAY | Freq: Two times a day (BID) | RESPIRATORY_TRACT | 1 refills | Status: DC

## 2018-08-03 NOTE — Telephone Encounter (Signed)
Next appt scheduled  11/13/18 with PCP.

## 2018-08-08 IMAGING — CR DG CHEST 2V
2 series · 2 of 2 positions shown · non-contrast
Comparison: 06/16/2017

CLINICAL DATA: Cough, shortness of breath, COPD, breast cancer

EXAM:
CHEST - 2 VIEW

[chest ap]
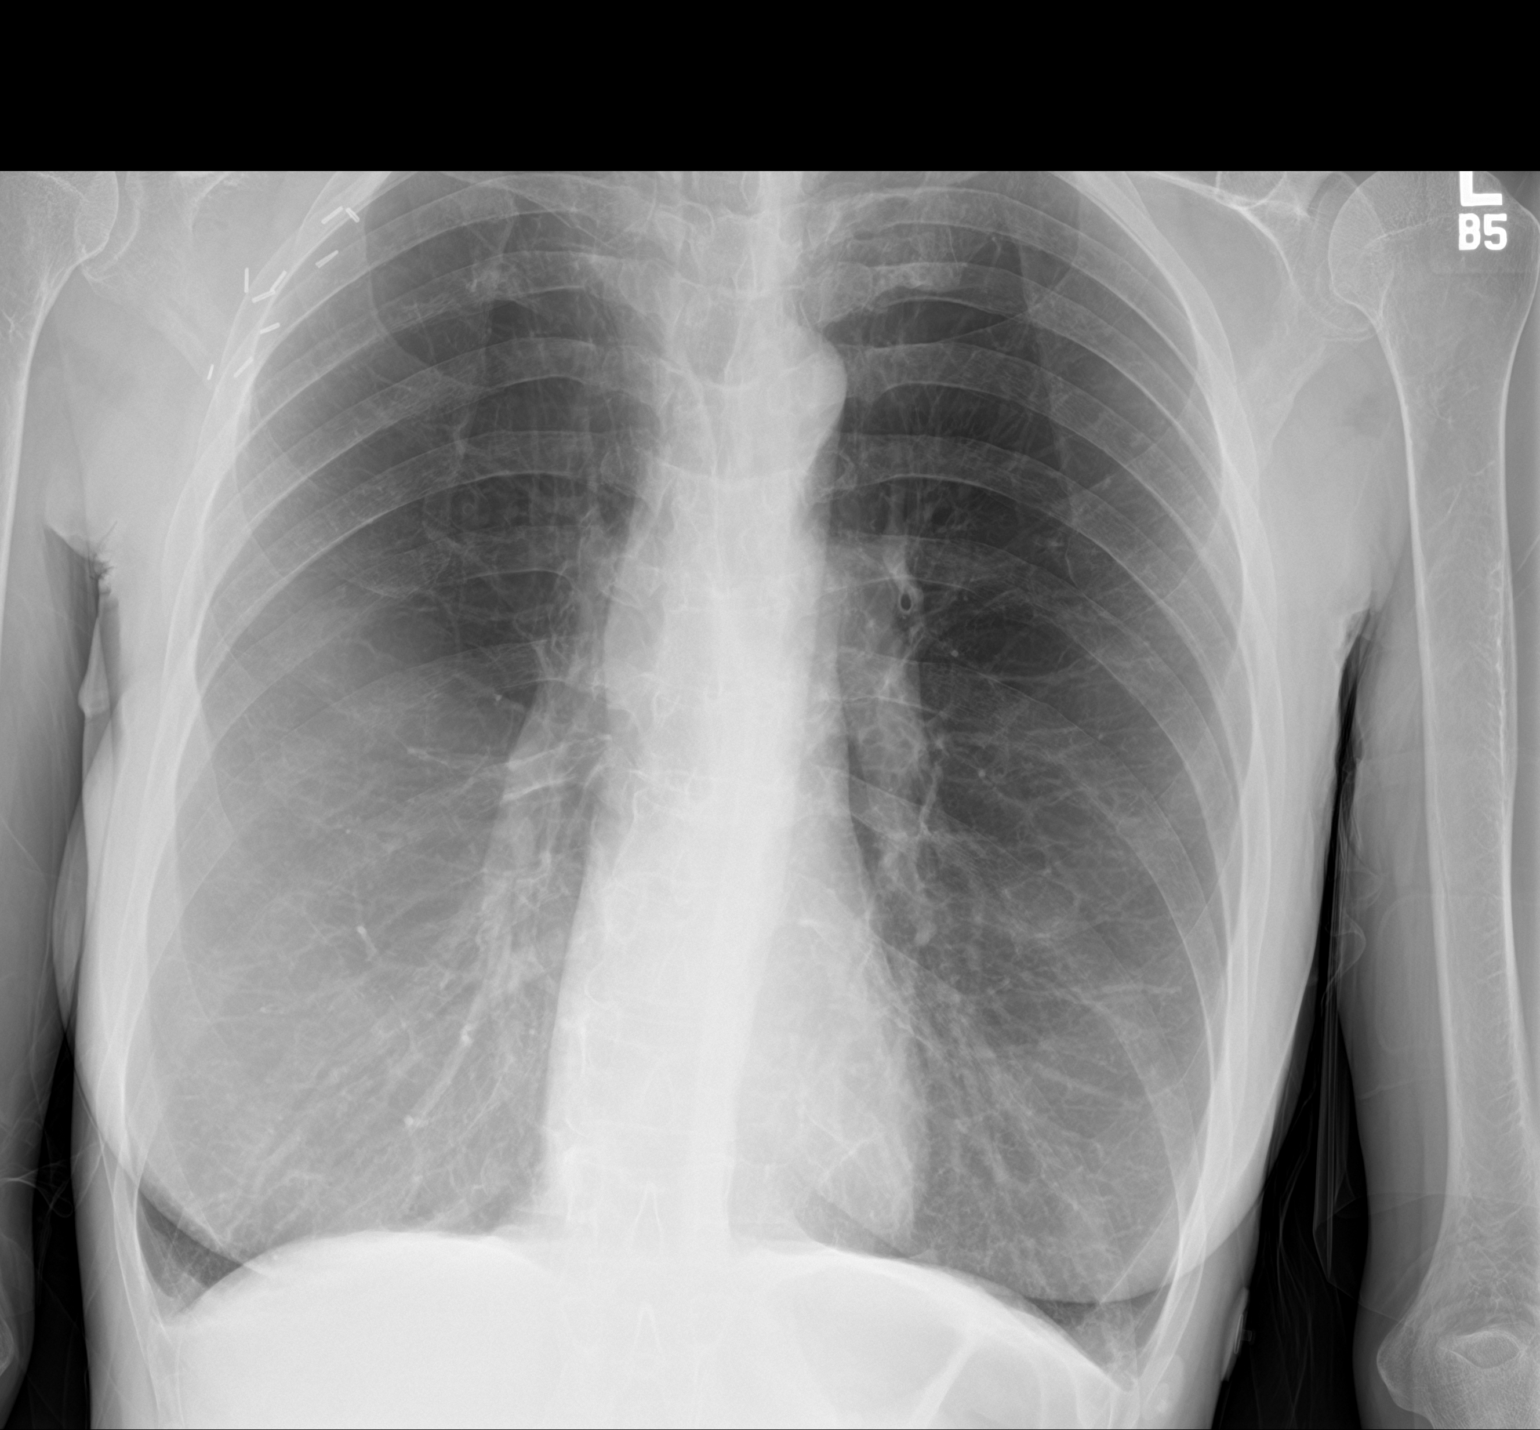

[chest lat]
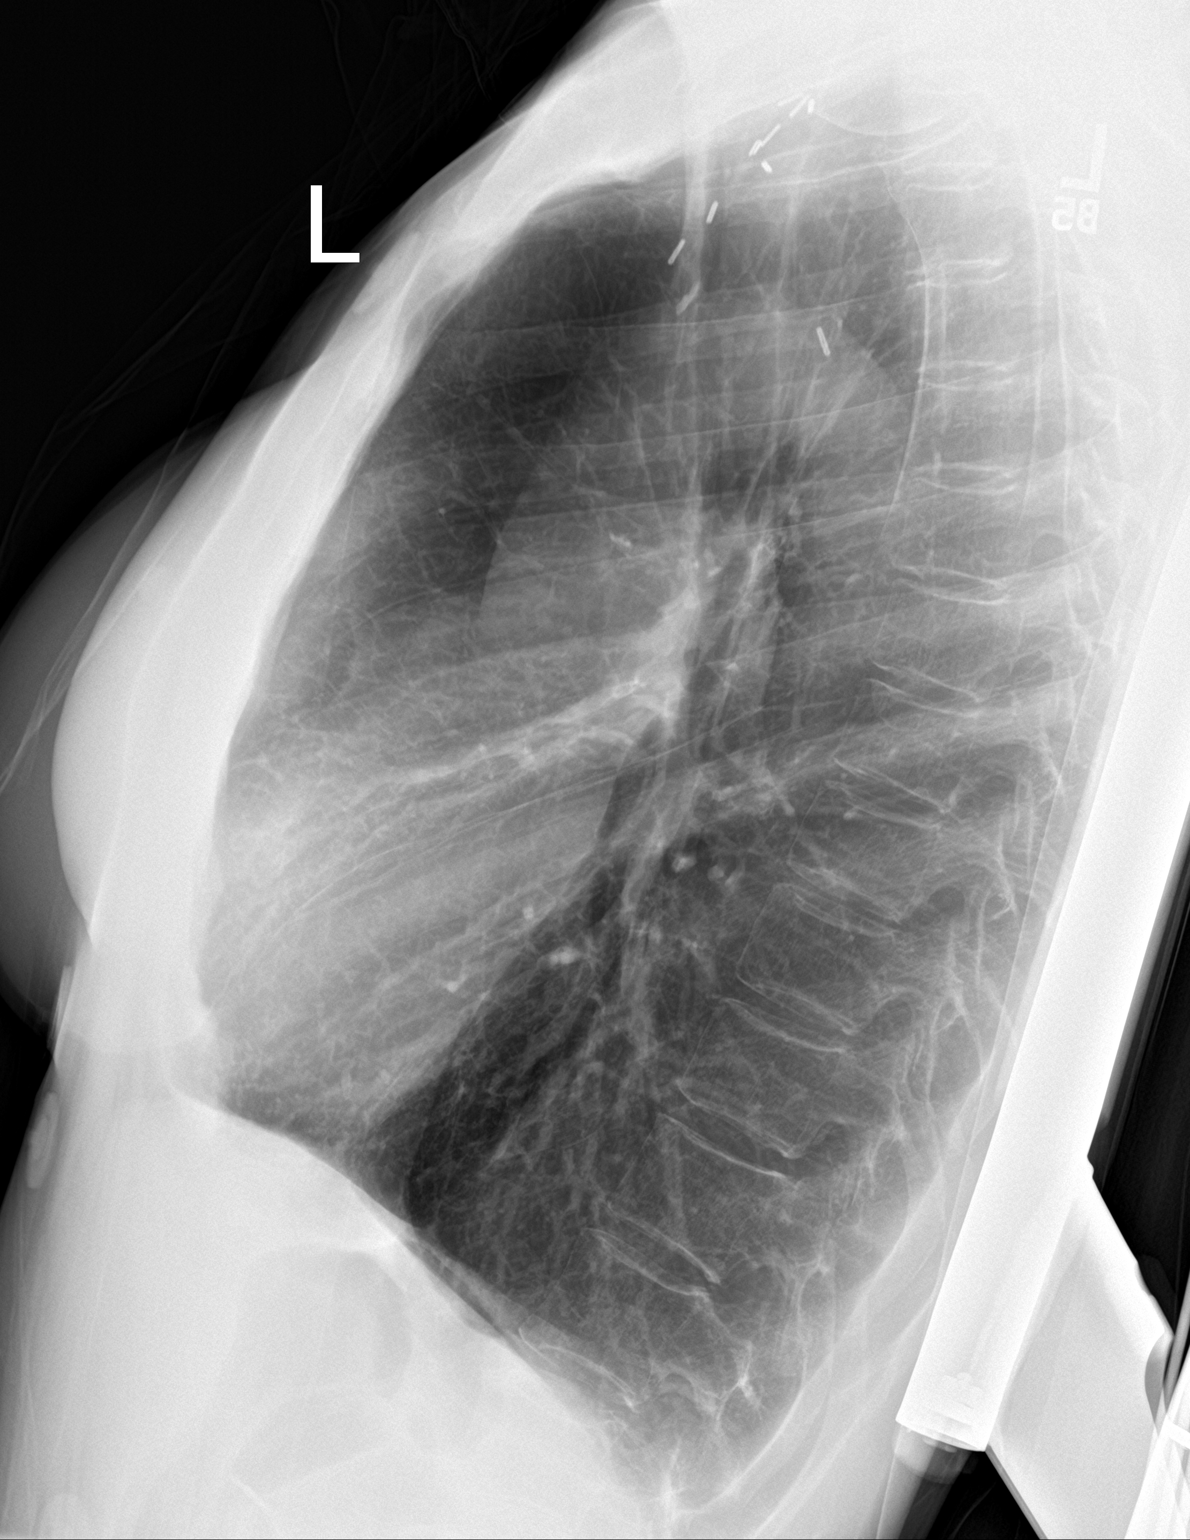

[2 of 2 positions shown; findings below may reference images not displayed]

FINDINGS: Normal heart size, mediastinal contours, and pulmonary vascularity.

Emphysematous and minimal bronchitic changes consistent with COPD.

No acute infiltrate, pleural effusion or pneumothorax.

Bones demineralized.

Prior RIGHT mastectomy and axillary node dissection with RIGHT
breast prosthesis noted.
IMPRESSION: COPD changes without acute infiltrate.

## 2018-09-10 ENCOUNTER — Ambulatory Visit: Payer: Medicare Other

## 2018-09-25 ENCOUNTER — Telehealth: Payer: Self-pay | Admitting: Internal Medicine

## 2018-09-25 IMAGING — DX DG CHEST 1V PORT
1 series · 2 of 2 positions shown · non-contrast
Comparison: December 15, 2017

CLINICAL DATA: Severe shortness of breath

EXAM:
PORTABLE CHEST 1 VIEW

[Series 1: chest ap · 0.14mm/px · 2 of 2 slices shown]
[im 1/2]
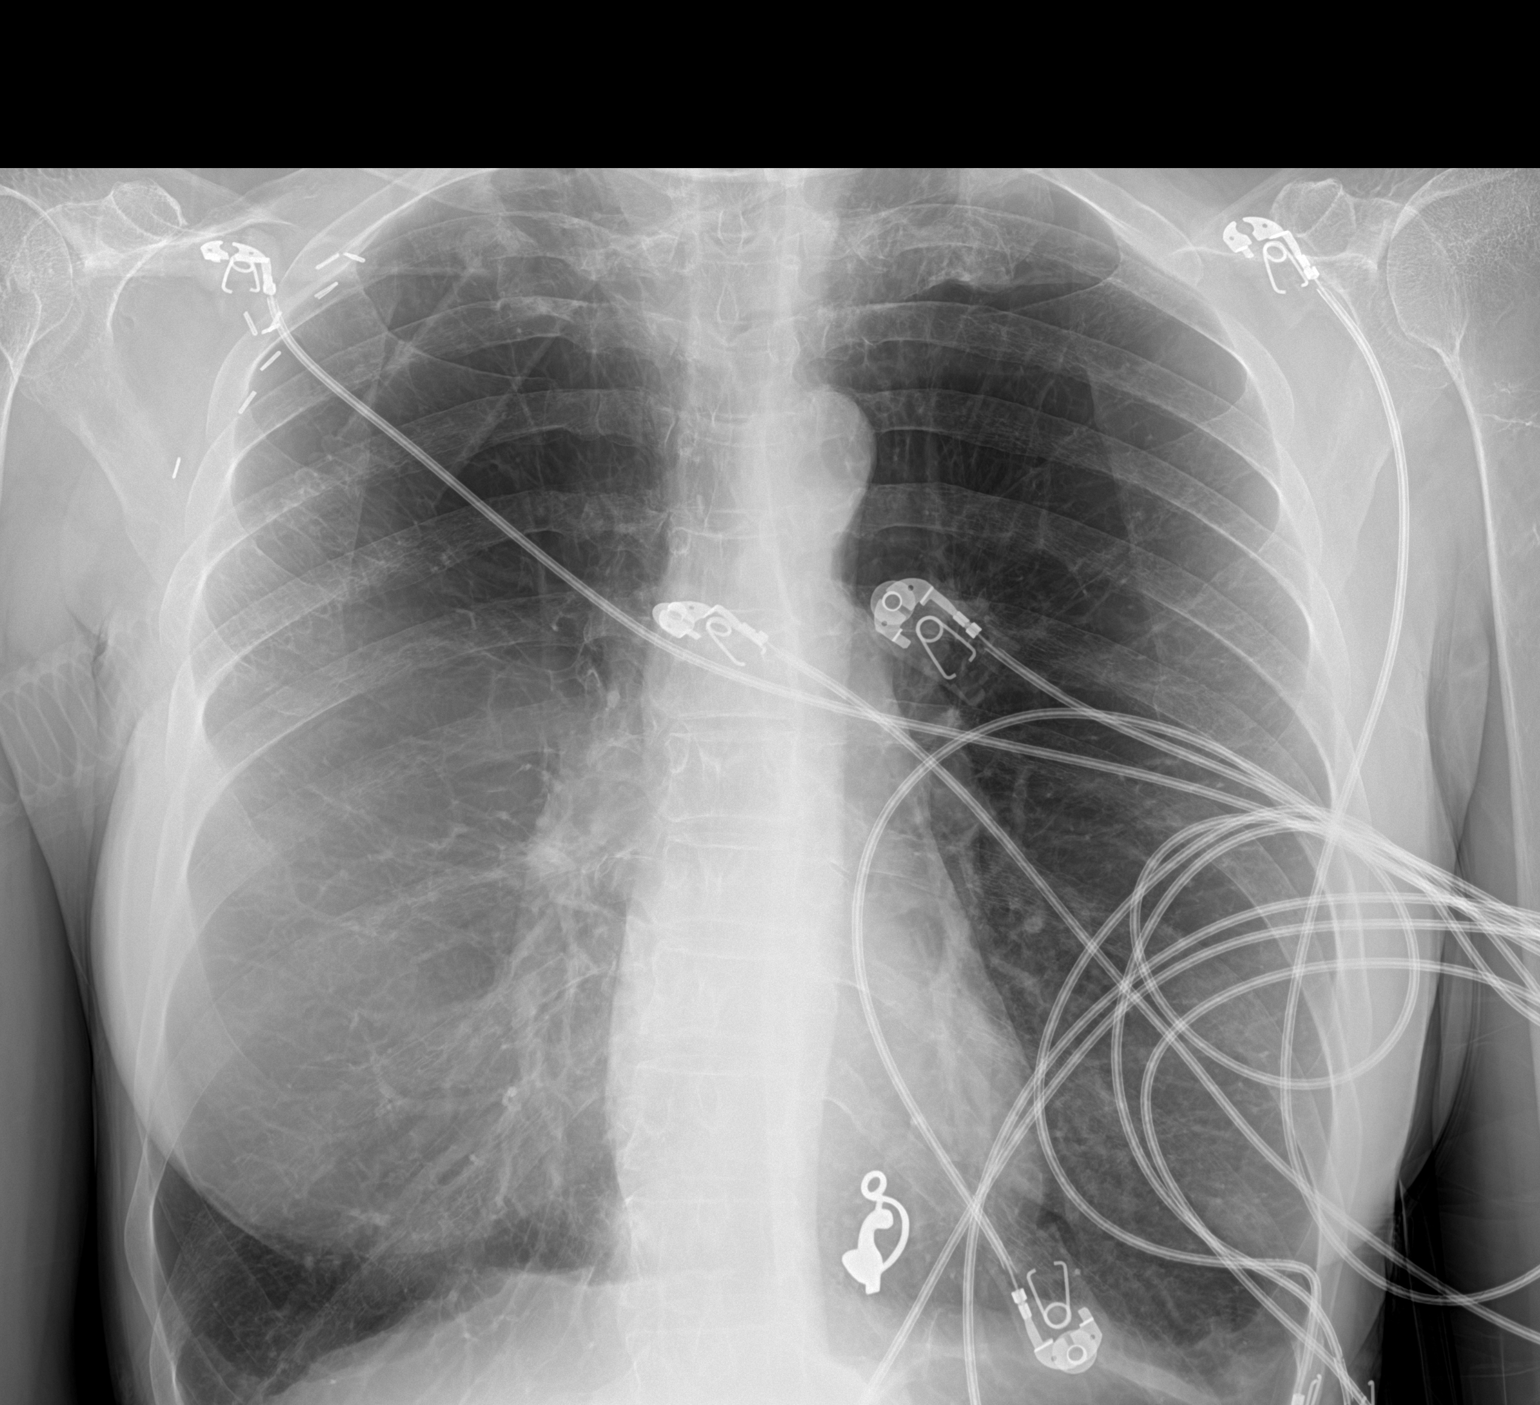
[im 2/2]
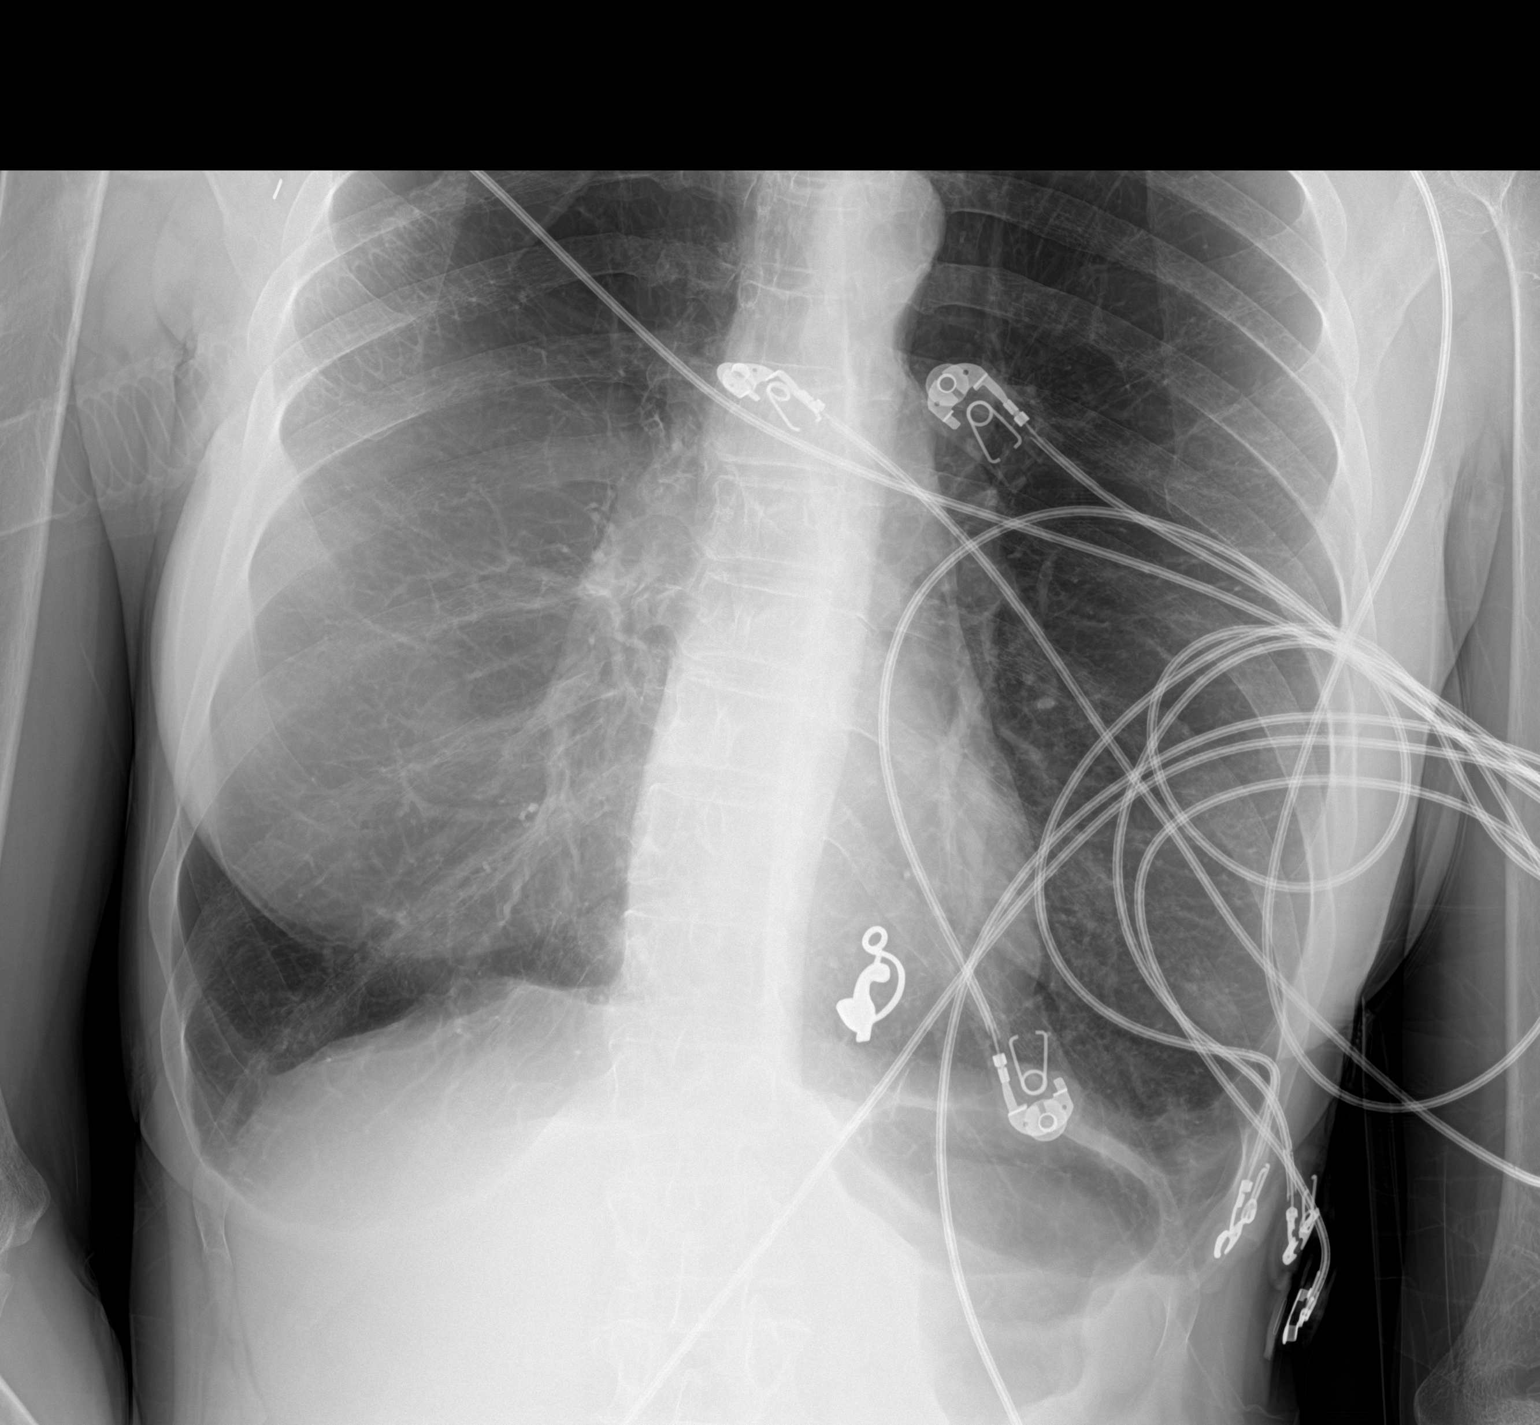

[2 of 2 positions shown; findings below may reference images not displayed]

FINDINGS: The heart size and mediastinal contours are within normal limits.
The lungs are hyperinflated. Both lungs are clear. The visualized
skeletal structures are unremarkable.
IMPRESSION: No active cardiopulmonary disease.  Marked COPD.

## 2018-09-25 NOTE — Telephone Encounter (Signed)
Called and spoke with pt who stated she has had cough with yellow phlegm and congestion x2 weeks now. Pt also stated she has had some chest pain.  Pt denies any fever.  Stated to pt that we needed to get her to come in for an appt to further address her symptoms. Pt expressed understanding. appt scheduled for pt Monday, 09/28/18 with MW. Nothing further needed.

## 2018-09-26 ENCOUNTER — Other Ambulatory Visit: Payer: Self-pay | Admitting: Internal Medicine

## 2018-09-26 DIAGNOSIS — J449 Chronic obstructive pulmonary disease, unspecified: Secondary | ICD-10-CM

## 2018-09-28 ENCOUNTER — Ambulatory Visit: Payer: Medicare Other | Admitting: Internal Medicine

## 2018-09-30 DIAGNOSIS — C50111 Malignant neoplasm of central portion of right female breast: Secondary | ICD-10-CM | POA: Diagnosis not present

## 2018-10-14 ENCOUNTER — Ambulatory Visit: Payer: Medicare Other | Admitting: Internal Medicine

## 2018-10-16 ENCOUNTER — Ambulatory Visit: Payer: Medicare Other | Admitting: Internal Medicine

## 2018-10-21 ENCOUNTER — Encounter: Payer: Self-pay | Admitting: Internal Medicine

## 2018-10-21 ENCOUNTER — Ambulatory Visit (INDEPENDENT_AMBULATORY_CARE_PROVIDER_SITE_OTHER): Payer: Medicare Other | Admitting: Internal Medicine

## 2018-10-21 VITALS — BP 86/48 | HR 120 | Ht 66.0 in | Wt 99.0 lb

## 2018-10-21 DIAGNOSIS — J9611 Chronic respiratory failure with hypoxia: Secondary | ICD-10-CM

## 2018-10-21 DIAGNOSIS — J449 Chronic obstructive pulmonary disease, unspecified: Secondary | ICD-10-CM | POA: Diagnosis not present

## 2018-10-21 MED ORDER — TIOTROPIUM BROMIDE MONOHYDRATE 2.5 MCG/ACT IN AERS: 2.0000 | INHALATION_SPRAY | Freq: Every day | RESPIRATORY_TRACT | 0 refills | Status: DC

## 2018-10-21 MED ORDER — PREDNISONE 10 MG PO TABS: ORAL_TABLET | ORAL | 0 refills | Status: DC

## 2018-10-21 NOTE — Patient Instructions (Signed)
Plan A = Automatic = symbicort / spiriva 2 pffs of each first thing in am then 12 hours later symbicort  Plan B = Backup Only use your albuterol inhaler as a rescue medication to be used if you can't catch your breath by resting or doing a relaxed purse lip breathing pattern.  - The less you use it, the better it will work when you need it. - Ok to use the inhaler up to 2 puffs  every 4 hours if you must but call for appointment if use goes up over your usual need - Don't leave home without it !!  (think of it like the spare tire for your car)   Plan C = Crisis - only use your albuterol nebulizer if you first try Plan B and it fails to help > ok to use the nebulizer up to every 4 hours but if start needing it regularly call for immediate appointment   Prednisone 10 mg take  4 each am x 2 days,   2 each am x 2 days,  1 each am x 2 days and stop    Please schedule a follow up visit in 6  months but call sooner if needed

## 2018-10-21 NOTE — Progress Notes (Signed)
Subjective:    Patient ID: Mary Jenkins, female    DOB: 09/23/1956  MRN: [id number]    Brief patient profile:   53  yobf quit smoking  07/24/11 not taking breathing medications at that point and subsequently placed on multiple meds and still sob just getting dressed so referred from the medicine clinic at [facility] 02/13/2012 for pulmonary eval with documented GOLD IV COPD 02/2013     History of Present Illness  02/13/2012 1st pulmonary cc progressive worse x 6 months doe x 50-100 ft can't do a grocery store where could do before quit. No real variability, cough is better with no excess or purulent sputum.  Not using saba daytime because finds if she's holding still doesn't need it as oftern. Some better p last  Prednisone rx. Not much variability. rec Stop atrovent and advair Start symbiocort Take 2 puffs first thing in am and then another 2 puffs about 12 hours later.  Take after only am dose Spiriva Protonix 40 mg Take 30-60 min before first meal of the day  GERD diet Only use your albuterol (Plan B= ventolin puffer,  Plan C is nebulizer) as a rescue medication to be used if you can't catch your breath by resting or doing a relaxed purse lip breathing pattern. The less you use it, the better it will work when you need it. Ok to use rescue up to every 4 hours if doing poorly.       12/18/2017  f/u ov/Mary Jenkins re:  COPD IV/ symb/ spiriva tapering pred  Chief Complaint  Patient presents with  . Follow-up    SOB with activity, productive cough -yellow thick mucus, slight wheezing at night more   Dyspnea:  MMRC3 = can't walk 100 yards even at a slow pace at a flat grade s stopping due to sob  Cough: congested cough  Sleep: ok flat most noct SABA use:  Not using  In any form  Rec Work on inhaler technique:  Finish prednisone as you  plan  For cough > mucinex dm up to 1200 mg daily and use flutter valve as much as possible  Whenever cough flares should be on priloec 20 mg Take 30- 60 min before  your first and last meals of the day then ok when cough better for a week to leave the prilosec back off  Please schedule a follow up office visit in 4 weeks, sooner if needed  with all medications /inhalers/ solutions in hand so we can verify exactly what you are taking. This includes all medications from all doctors and over the counters     01/15/2018  f/u ov/Mary Jenkins re: copd iv/ did not bring meds  Chief Complaint  Patient presents with  . Follow-up    Reports increased SOB since weather has changed. Reports her daughter used some kind of incense mist and her breathing has been worse every since. No improvement noted with neb or inhaler.   Dyspnea:  Still MMRC3 = can't walk 100 yards even at a slow pace at a flat grade s stopping due to sob   Cough: worse with certain foods / exp to incense / non prod Sleep:   No 02 / flat ok  SABA use:  Last used 2 weeks prior to OV   Feels much better on prednisone  And worse off it  Taking ppi hs       07/14/2018  f/u ov/Mary Jenkins re:  Copd GOLD IV/ 02 dep / ? slt flare  p uri exp  Chief Complaint  Patient presents with  . Follow-up    Increased SOB recently- relates to her grandson having a cold. She has some chest tightness. She is using her albuterol inhaler 2 x daily and rarely use her neb.    Dyspnea:  Foodlion on 3lpm poc but does not check sats Cough: min cough / nothing purulent Sleeping: flat / no pillows SABA use: as above/ chest tightness better p saba though hfa ti too short - see a/p  02: 2lpm at hs / 3lpm poc  / going back to school and present POC too noisy so turns it off then rec Spiriva should be 4 puffs of the blue or 2 pffs of the green  Tipped  spiriva each am Prednisone 10 mg take  4 each am x 2 days,   2 each am x 2 days,  1 each am x 2 days and stop   Continue omeprazole 20 mg Take 30-60 min before first meal of the day but add another dose 30 min supper for respiratory flares Plan B = Backup Only use your albuterol (ventolin)  as a rescue medication  Plan C = Crisis - only use your albuterol nebulizer if you first try Plan B and it fails to help > ok to use the nebulizer up to every 4 hours but if start needing it regularly call for immediate appointment   10/21/2018  f/u ov/Mary Jenkins re:  Copd GOLD IV /  Chief Complaint  Patient presents with  . Follow-up    Pt c/o increased cough and SOB over the past month.  She is using the ventolin inhaler multiple times per day. She rarely uses neb.    Dyspnea:  Last food lion x few months Cough: more congestion/ esp in am and hs  Sleeping: prop up 30 degrees vs before  SABA use: as above, much more since ran out of spiriva  02: 2lpm hs,  None sitting,  2-3 poc    No obvious day to day or daytime variability or assoc excess/ purulent sputum or mucus plugs or hemoptysis or cp or chest tightness, subjective wheeze or overt sinus or hb symptoms.     Also denies any obvious fluctuation of symptoms with weather or environmental changes or other aggravating or alleviating factors except as outlined above   No unusual exposure hx or h/o childhood pna/ asthma or knowledge of premature birth.  Current Allergies, Complete Past Medical History, Past Surgical History, Family History, and Social History were reviewed in [employer] record.  ROS  The following are not active complaints unless bolded Hoarseness, sore throat, dysphagia, dental problems, itching, sneezing,  nasal congestion or discharge of excess mucus or purulent secretions, ear ache,   fever, chills, sweats, unintended wt loss or wt gain, classically pleuritic or exertional cp,  orthopnea pnd or arm/hand swelling  or leg swelling, presyncope, palpitations, abdominal pain, anorexia, nausea, vomiting, diarrhea  or change in bowel habits or change in bladder habits, change in stools or change in urine, dysuria, hematuria,  rash, arthralgias, visual complaints, headache, numbness, weakness or ataxia or  problems with walking or coordination,  change in mood or  memory.        Current Meds  Medication Sig  . albuterol (PROVENTIL) (2.5 MG/3ML) 0.083% nebulizer solution USE 3 ML IN NEBULIZER  EVERY 6 HOURS AS NEEDED FOR WHEEZING OR SHORTNESS OF BREATH  . omeprazole (PRILOSEC) 20 MG capsule Take 1 capsule (20 mg  total) by mouth 2 (two) times daily before a meal.  . OXYGEN 2lpm with rest and 3 with exertion  . RESTASIS 0.05 % ophthalmic emulsion Place 2 drops into both eyes daily.   . valACYclovir (VALTREX) 1000 MG tablet TAKE ONE TABLET BY MOUTH TWICE DAILY FOR 3 DAYS AS NEEDED  . VENTOLIN HFA 108 (90 Base) MCG/ACT inhaler INHALE 2 PUFFS BY MOUTH EVERY 6 HOURS AS NEEDED FOR WHEEZING     symb 160 2bid          Objective:   Physical Exam    W/c bound elderly bf  Vital signs reviewed - Note on arrival 02 sats  98% on 3lpm POC    10/21/2018    99  Wt 120 02/13/2012  > 119 03/13/2012 > 04/22/2012 117 > 07/21/2012 122> 12/02/2012  131> 03/22/2013  131 > 04/19/2013  129  > 07/19/2013 131 > 06/06/2014 129 >  11/11/2014  132> 02/28/2015   129 > 01/16/2016  115 >  02/29/2016  110 > 05/31/2016  115  > 11/21/2016   117 > 02/21/2017   107 > 06/16/2017 102 > 08/25/2017   106 > 11/26/2017 99 > 12/18/2017 >  01/15/2018  98 > 07/14/2018 104       HEENT: nl dentition / oropharynx. Nl external ear canals without cough reflex -  Mild bilateral non-specific turbinate edema     NECK :  without JVD/Nodes/TM/ nl carotid upstrokes bilaterally   LUNGS: no acc muscle use,  Mod barrel  contour chest wall with bilateral  Distant bs s audible wheeze and  without cough on insp or exp maneuver and mod  Hyperresonant  to  percussion bilaterally     CV:  RRR  no s3 or murmur or increase in P2, and no edema   ABD:  soft and nontender with pos mid insp Hoover's  in the supine position. No bruits or organomegaly appreciated, bowel sounds nl  MS:   Nl gait/  ext warm without deformities, calf tenderness, cyanosis or clubbing No obvious  joint restrictions   SKIN: warm and dry without lesions    NEURO:  alert, approp, nl sensorium with  no motor or cerebellar deficits apparent.               Assessment & Plan:

## 2018-10-21 NOTE — Assessment & Plan Note (Signed)
sats 95% on RA 07/14/2018 so rec 2lpm hs and 3lpm poc with activity and none needed at rest   Adequate control on present rx, reviewed in detail with pt > no change in rx needed     I had an extended discussion with the patient reviewing all relevant studies completed to date and  lasting 15 to 20 minutes of a 25 minute visit    See device teaching which extended face to face time for this visit.  Each maintenance medication was reviewed in detail including emphasizing most importantly the difference between maintenance and prns and under what circumstances the prns are to be triggered using an action plan format that is not reflected in the computer generated alphabetically organized AVS which I have not found useful in most complex patients, especially with respiratory illnesses  Please see AVS for specific instructions unique to this visit that I personally wrote and verbalized to the the pt in detail and then reviewed with pt  by my nurse highlighting any  changes in therapy recommended at today's visit to their plan of care.

## 2018-10-21 NOTE — Assessment & Plan Note (Addendum)
Quit smoking 06/2011  PFT performed on 06/27/2011. Poor quality.    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 prior to study with DLCO  24 % corrects to 33 % for alv volume    - referred to rehab again 11/26/2017 > could not afford it   10/21/2018  After extensive coaching inhaler device,  effectiveness =    75% with smi, short Ti/low IC    Group D in terms of symptom/risk and laba/lama/ICS  therefore appropriate rx at this point so needs the lama and trying to get it from [place] on backorder per store so given two samples today plus pred x 6 days - see avs for instructions unique to this ov

## 2018-11-04 ENCOUNTER — Other Ambulatory Visit: Payer: Self-pay | Admitting: Internal Medicine

## 2018-11-04 DIAGNOSIS — J449 Chronic obstructive pulmonary disease, unspecified: Secondary | ICD-10-CM

## 2018-11-04 NOTE — Telephone Encounter (Signed)
Next appt scheduled 2/21 with PCP.

## 2018-11-13 ENCOUNTER — Ambulatory Visit (INDEPENDENT_AMBULATORY_CARE_PROVIDER_SITE_OTHER): Payer: Medicare Other | Admitting: Internal Medicine

## 2018-11-13 ENCOUNTER — Encounter: Payer: Self-pay | Admitting: Internal Medicine

## 2018-11-13 ENCOUNTER — Other Ambulatory Visit: Payer: Self-pay

## 2018-11-13 VITALS — BP 96/68 | HR 92 | Temp 98.2°F | Ht 66.0 in | Wt 99.6 lb

## 2018-11-13 DIAGNOSIS — Z9011 Acquired absence of right breast and nipple: Secondary | ICD-10-CM | POA: Diagnosis not present

## 2018-11-13 DIAGNOSIS — J449 Chronic obstructive pulmonary disease, unspecified: Secondary | ICD-10-CM

## 2018-11-13 DIAGNOSIS — E785 Hyperlipidemia, unspecified: Secondary | ICD-10-CM | POA: Diagnosis not present

## 2018-11-13 DIAGNOSIS — Z9981 Dependence on supplemental oxygen: Secondary | ICD-10-CM | POA: Diagnosis not present

## 2018-11-13 DIAGNOSIS — R634 Abnormal weight loss: Secondary | ICD-10-CM

## 2018-11-13 DIAGNOSIS — J439 Emphysema, unspecified: Secondary | ICD-10-CM | POA: Diagnosis not present

## 2018-11-13 DIAGNOSIS — M5020 Other cervical disc displacement, unspecified cervical region: Secondary | ICD-10-CM | POA: Diagnosis not present

## 2018-11-13 DIAGNOSIS — Z853 Personal history of malignant neoplasm of breast: Secondary | ICD-10-CM

## 2018-11-13 DIAGNOSIS — J441 Chronic obstructive pulmonary disease with (acute) exacerbation: Secondary | ICD-10-CM

## 2018-11-13 DIAGNOSIS — Z79899 Other long term (current) drug therapy: Secondary | ICD-10-CM | POA: Diagnosis not present

## 2018-11-13 DIAGNOSIS — M4802 Spinal stenosis, cervical region: Secondary | ICD-10-CM

## 2018-11-13 DIAGNOSIS — M47812 Spondylosis without myelopathy or radiculopathy, cervical region: Secondary | ICD-10-CM | POA: Diagnosis not present

## 2018-11-13 DIAGNOSIS — G8929 Other chronic pain: Secondary | ICD-10-CM | POA: Diagnosis not present

## 2018-11-13 DIAGNOSIS — Z7951 Long term (current) use of inhaled steroids: Secondary | ICD-10-CM | POA: Diagnosis not present

## 2018-11-13 DIAGNOSIS — M5030 Other cervical disc degeneration, unspecified cervical region: Secondary | ICD-10-CM | POA: Diagnosis not present

## 2018-11-13 DIAGNOSIS — Z681 Body mass index (BMI) 19 or less, adult: Secondary | ICD-10-CM

## 2018-11-13 DIAGNOSIS — Z87891 Personal history of nicotine dependence: Secondary | ICD-10-CM | POA: Diagnosis not present

## 2018-11-13 MED ORDER — DICLOFENAC SODIUM 1 % TD GEL: 2.0000 g | Freq: Four times a day (QID) | TRANSDERMAL | 1 refills | Status: DC

## 2018-11-13 MED ORDER — PREGABALIN 75 MG PO CAPS: 75.0000 mg | ORAL_CAPSULE | Freq: Two times a day (BID) | ORAL | 1 refills | Status: DC

## 2018-11-13 NOTE — Assessment & Plan Note (Signed)
Patient states that her neck pain for several years. She describes the pain as sharp, constant, and present over the middle of her neck and radiating to the left side. She has foraminal encroachment c3-c4, mild spinal stenosis c4-c5, left paracentral bulge c5-c6 with mild spinal stenosis and asymmetric foraminal narrowing right worse than left, facet djd and mild disc bulge c7-t1 per imaging in October 2016. She had cervical injection done in October 2016 which she states provided relief for her.   At last visit, I started the patient on lyrica 31m bid as she was not getting relief from gabapentin and did not want invasive intervention. The patient states that she used lyrica for one month but stopped thereafter.   Assessment and plan  Recommended the patient try lyrica again for a longer duration. I also prescribed her some voltaren gel as she has some muscle tension that was appreciated in the left upper back. If the patient does not find relief with these interventions, will recommend MRI imaging.

## 2018-11-13 NOTE — Assessment & Plan Note (Signed)
Per patient's lipid panel in august 2018 she has a ascvd risk score of 2.2%. She does not need to be on statin therapy at this time.   Assessment and plan Recommended that she continue to maintain healthy lifestyle with good diet and exercise.

## 2018-11-13 NOTE — Patient Instructions (Signed)
It was a pleasure to see you today Ms. Bordley.  Please continue to take spiriva, sybicort, and supplemental oxygen 2-3L. Please restart lyrica 36m bid for your neck pain and use voltaren gel over your left upper back.   If you have any questions or concerns, please call our clinic at [phone] 9am-5pm and after hours call [phone] and ask for the internal medicine resident on call. If you feel you are having a medical emergency please call 911.   Thank you, we look forward to help you remain healthy!  VLars Mage MD Internal Medicine PGY2

## 2018-11-13 NOTE — Assessment & Plan Note (Signed)
Patient was seen by Dr. Melvyn Novas (pulmonology) on 10/21/18 and was told to continue symbicort(bumesonide-formoterol)/spiriva (tiotropium) 2 puffs of each in am and then 12 hrs later another dose of symbicort, o2 2l with rest and 3l with exertion. In addition, she was told to start lama, but as it is on backorder at pharmacy she was given a 6 day taper of prednisone.  Assessment and plan  -continue symbicort 2 puffs bid -continue spiriva 2puffs daily -continue ventolin q6hrs prn -continue proventil nebulizer q6hrs prn

## 2018-11-13 NOTE — Assessment & Plan Note (Signed)
The patient has has 30lbs of weight loss since 2016. She is currently 99lbs. She states that she has had decreased appetite without any change in taste sensation.  She has tsh of 4.6, has been negative for hiv, normal renal function, and low phq9 scores. CT chest and abdomen from 2019 do not show any findings to explain weight loss. Last mammogram august 2019 does not show evidence for malignancy.   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 Recommended her continue food supplementation. Her weight has remained stable over the past one year which is reassuring. Recommended her monitor for significant weight changes.

## 2018-11-13 NOTE — Progress Notes (Signed)
   CC: Neck pain  HPI:  [name] is a 63 y.o. COPD IV, Hx of right breast cancer s/p right mastectomy in 1997, breast reconstruction and insertion of tissue expander in sept 2014, and degenerative disc disorder who presents for follow up of neck pain. Please see problem based charting for evaluation, assessment, and plan.   Past Medical History:  Diagnosis Date  . Breast cancer ([place])   . Cataract, immature    bilateral  . COPD (chronic obstructive pulmonary disease) ([place])    no home O2  . Cough 02/09/2015  . DDD (degenerative disc disease), cervical   . Exertional shortness of breath    states if she "takes her time" doing activities does not get SOB  . Family history of adverse reaction to anesthesia    pt's sister has hx. of post-op N/V  . Full dentures   . GERD (gastroesophageal reflux disease) 02/03/2012  . High cholesterol    no current med.  Marland Kitchen History of breast cancer   . Personal history of chemotherapy 1997  . Runny nose 02/09/2015   clear drainage, per pt.   Review of Systems:   Review of Systems  Constitutional: Negative for chills, fever and weight loss.  Respiratory: Negative for cough, hemoptysis and shortness of breath.   Cardiovascular: Negative for chest pain.  Gastrointestinal: Negative for abdominal pain and diarrhea.  Neurological: Negative for dizziness and headaches.   Physical Exam:  Vitals:   11/13/18 1334  BP: 96/68  Pulse: 92  Temp: 98.2 F (36.8 C)  TempSrc: Oral  SpO2: 96%  Weight: 99 lb 9.6 oz (45.2 kg)  Height: _0  (1.676 m)   Physical Exam  Constitutional: Appears well-developed and well-nourished. No distress.  HENT:  Head: Normocephalic and atraumatic.  Eyes: Conjunctivae are normal.  Cardiovascular: Normal rate, regular rhythm and normal heart sounds.  Respiratory: Effort normal and breath sounds normal. No respiratory distress. No wheezes.On 2L [place]  GI: Soft. Bowel sounds are normal. No distension. There is no  tenderness.  Musculoskeletal: Tenderness to palpation of left upper back and mid neck. No edema.  Neurological: Is alert.  Skin: Not diaphoretic. No erythema.  Psychiatric: Normal mood and affect. Behavior is normal. Judgment and thought content normal.    Assessment & Plan:   See Encounters Tab for problem based charting.  Patient discussed with Dr. Evette Doffing

## 2018-11-16 NOTE — Progress Notes (Signed)
Internal Medicine Clinic Attending  Case discussed with Dr. Chundi at the time of the visit.  We reviewed the resident's history and exam and pertinent patient test results.  I agree with the assessment, diagnosis, and plan of care documented in the resident's note. 

## 2018-11-16 NOTE — Addendum Note (Signed)
Addended by: Lalla Brothers T on: 11/16/2018 01:33 PM   Modules accepted: Level of Service

## 2018-12-08 ENCOUNTER — Other Ambulatory Visit: Payer: Self-pay | Admitting: Internal Medicine

## 2018-12-08 DIAGNOSIS — J449 Chronic obstructive pulmonary disease, unspecified: Secondary | ICD-10-CM

## 2018-12-10 ENCOUNTER — Other Ambulatory Visit: Payer: Self-pay | Admitting: Internal Medicine

## 2018-12-10 DIAGNOSIS — J449 Chronic obstructive pulmonary disease, unspecified: Secondary | ICD-10-CM

## 2018-12-10 NOTE — Telephone Encounter (Signed)
Needs refill on SYMBICORT 160-4.5 MCG/ACT inhaler  [place], [place] - [address];pt contact [phone]  Pt is requesting that pls add some refills so she will not have to keep calling.

## 2018-12-10 NOTE — Telephone Encounter (Signed)
Pt called / informed Symbicort was refilled today with 3 RF's.

## 2019-01-05 ENCOUNTER — Telehealth: Payer: Self-pay | Admitting: Internal Medicine

## 2019-01-05 MED ORDER — OMEPRAZOLE 20 MG PO CPDR: 20.0000 mg | DELAYED_RELEASE_CAPSULE | Freq: Two times a day (BID) | ORAL | 2 refills | Status: DC

## 2019-01-05 NOTE — Telephone Encounter (Signed)
Returned call to patient.  States she is still using omeprazole 24m as instructed by Dr. WMelvyn Novas  LOV 10/21/18 Dr. WGustavus Bryantnote: Continue omeprazole 20 mg Take 30-60 min before first meal of the day but add another dose 30 min supper for respiratory flares  Patient states she does find this helps and [facility]  Omeprazole order sent today.  Nothing further needed.

## 2019-01-11 ENCOUNTER — Other Ambulatory Visit: Payer: Self-pay

## 2019-01-11 ENCOUNTER — Observation Stay (HOSPITAL_COMMUNITY)
Admission: EM | Admit: 2019-01-11 | Discharge: 2019-01-12 | Disposition: A | Discharge status: Home / Self Care | Payer: Medicare Other | Attending: Internal Medicine | Admitting: Internal Medicine

## 2019-01-11 ENCOUNTER — Emergency Department (HOSPITAL_COMMUNITY): Payer: Medicare Other

## 2019-01-11 ENCOUNTER — Encounter (HOSPITAL_COMMUNITY): Payer: Self-pay | Admitting: *Deleted

## 2019-01-11 DIAGNOSIS — Z20828 Contact with and (suspected) exposure to other viral communicable diseases: Secondary | ICD-10-CM | POA: Insufficient documentation

## 2019-01-11 DIAGNOSIS — Z791 Long term (current) use of non-steroidal anti-inflammatories (NSAID): Secondary | ICD-10-CM | POA: Insufficient documentation

## 2019-01-11 DIAGNOSIS — R0689 Other abnormalities of breathing: Secondary | ICD-10-CM | POA: Diagnosis not present

## 2019-01-11 DIAGNOSIS — Z7951 Long term (current) use of inhaled steroids: Secondary | ICD-10-CM | POA: Insufficient documentation

## 2019-01-11 DIAGNOSIS — Z9981 Dependence on supplemental oxygen: Secondary | ICD-10-CM | POA: Diagnosis not present

## 2019-01-11 DIAGNOSIS — Z79890 Hormone replacement therapy (postmenopausal): Secondary | ICD-10-CM | POA: Diagnosis not present

## 2019-01-11 DIAGNOSIS — R05 Cough: Secondary | ICD-10-CM | POA: Diagnosis not present

## 2019-01-11 DIAGNOSIS — J80 Acute respiratory distress syndrome: Secondary | ICD-10-CM | POA: Diagnosis not present

## 2019-01-11 DIAGNOSIS — Z79899 Other long term (current) drug therapy: Secondary | ICD-10-CM | POA: Diagnosis not present

## 2019-01-11 DIAGNOSIS — K219 Gastro-esophageal reflux disease without esophagitis: Secondary | ICD-10-CM | POA: Diagnosis not present

## 2019-01-11 DIAGNOSIS — Z7722 Contact with and (suspected) exposure to environmental tobacco smoke (acute) (chronic): Secondary | ICD-10-CM | POA: Diagnosis not present

## 2019-01-11 DIAGNOSIS — J441 Chronic obstructive pulmonary disease with (acute) exacerbation: Principal | ICD-10-CM | POA: Diagnosis present

## 2019-01-11 DIAGNOSIS — Z87891 Personal history of nicotine dependence: Secondary | ICD-10-CM | POA: Diagnosis not present

## 2019-01-11 DIAGNOSIS — R000 Tachycardia, unspecified: Secondary | ICD-10-CM | POA: Diagnosis not present

## 2019-01-11 DIAGNOSIS — R0602 Shortness of breath: Secondary | ICD-10-CM | POA: Diagnosis not present

## 2019-01-11 DIAGNOSIS — R069 Unspecified abnormalities of breathing: Secondary | ICD-10-CM | POA: Diagnosis not present

## 2019-01-11 LAB — CBC WITH DIFFERENTIAL/PLATELET
Abs Immature Granulocytes: 0.03 10*3/uL (ref 0.00–0.07)
Basophils Absolute: 0 10*3/uL (ref 0.0–0.1)
Basophils Relative: 0 %
Eosinophils Absolute: 0.1 10*3/uL (ref 0.0–0.5)
Eosinophils Relative: 1 %
HCT: 40.1 % (ref 36.0–46.0)
Hemoglobin: 12.4 g/dL (ref 12.0–15.0)
Immature Granulocytes: 0 %
Lymphocytes Relative: 11 %
Lymphs Abs: 1.1 10*3/uL (ref 0.7–4.0)
MCH: 26.6 pg (ref 26.0–34.0)
MCHC: 30.9 g/dL (ref 30.0–36.0)
MCV: 85.9 fL (ref 80.0–100.0)
Monocytes Absolute: 0.6 10*3/uL (ref 0.1–1.0)
Monocytes Relative: 6 %
Neutro Abs: 8.1 10*3/uL — ABNORMAL HIGH (ref 1.7–7.7)
Neutrophils Relative %: 82 %
Platelets: 273 10*3/uL (ref 150–400)
RBC: 4.67 MIL/uL (ref 3.87–5.11)
RDW: 16.3 % — ABNORMAL HIGH (ref 11.5–15.5)
WBC: 10 10*3/uL (ref 4.0–10.5)
nRBC: 0 % (ref 0.0–0.2)

## 2019-01-11 LAB — COMPREHENSIVE METABOLIC PANEL
ALT: 17 U/L (ref 0–44)
AST: 26 U/L (ref 15–41)
Albumin: 3.7 g/dL (ref 3.5–5.0)
Alkaline Phosphatase: 52 U/L (ref 38–126)
Anion gap: 12 (ref 5–15)
BUN: 14 mg/dL (ref 8–23)
CO2: 25 mmol/L (ref 22–32)
Calcium: 9 mg/dL (ref 8.9–10.3)
Chloride: 105 mmol/L (ref 98–111)
Creatinine, Ser: 1 mg/dL (ref 0.44–1.00)
GFR calc Af Amer: >60 mL/min (ref >60)
GFR calc non Af Amer: >60 mL/min (ref >60)
Glucose, Bld: 143 mg/dL — ABNORMAL HIGH (ref 70–99)
Potassium: 4.1 mmol/L (ref 3.5–5.1)
Sodium: 142 mmol/L (ref 135–145)
Total Bilirubin: 0.6 mg/dL (ref 0.3–1.2)
Total Protein: 6.8 g/dL (ref 6.5–8.1)

## 2019-01-11 LAB — BRAIN NATRIURETIC PEPTIDE: B Natriuretic Peptide: 34.6 pg/mL (ref 0.0–100.0)

## 2019-01-11 LAB — TROPONIN I: Troponin I: <0.03 ng/mL (ref <0.03)

## 2019-01-11 LAB — GLUCOSE, CAPILLARY: Glucose-Capillary: 132 mg/dL — ABNORMAL HIGH (ref 70–99)

## 2019-01-11 LAB — SARS CORONAVIRUS 2 BY RT PCR (HOSPITAL ORDER, PERFORMED IN ~~LOC~~ HOSPITAL LAB): SARS Coronavirus 2: NEGATIVE

## 2019-01-11 MED ORDER — TIOTROPIUM BROMIDE MONOHYDRATE 2.5 MCG/ACT IN AERS: 2.0000 | INHALATION_SPRAY | Freq: Every day | RESPIRATORY_TRACT | Status: DC

## 2019-01-11 MED ORDER — PANTOPRAZOLE SODIUM 40 MG PO TBEC
40.0000 mg | DELAYED_RELEASE_TABLET | Freq: Every day | ORAL | Status: DC
  Administered 2019-01-11 – 2019-01-12 (×2): 40 mg via ORAL
  Filled 2019-01-11 (×2): qty 1

## 2019-01-11 MED ORDER — ACETAMINOPHEN 325 MG PO TABS
650.0000 mg | ORAL_TABLET | Freq: Four times a day (QID) | ORAL | Status: DC | PRN
  Administered 2019-01-12: 650 mg via ORAL
  Filled 2019-01-11: qty 2

## 2019-01-11 MED ORDER — PREDNISONE 20 MG PO TABS
40.0000 mg | ORAL_TABLET | Freq: Every day | ORAL | Status: DC
  Administered 2019-01-12: 40 mg via ORAL
  Filled 2019-01-11: qty 2

## 2019-01-11 MED ORDER — IPRATROPIUM BROMIDE HFA 17 MCG/ACT IN AERS
2.0000 | INHALATION_SPRAY | Freq: Once | RESPIRATORY_TRACT | Status: AC
  Administered 2019-01-11: 2 via RESPIRATORY_TRACT
  Filled 2019-01-11: qty 12.9

## 2019-01-11 MED ORDER — UMECLIDINIUM BROMIDE 62.5 MCG/INH IN AEPB
1.0000 | INHALATION_SPRAY | Freq: Every day | RESPIRATORY_TRACT | Status: DC
  Administered 2019-01-12: 1 via RESPIRATORY_TRACT
  Filled 2019-01-11: qty 7

## 2019-01-11 MED ORDER — ALBUTEROL SULFATE HFA 108 (90 BASE) MCG/ACT IN AERS
8.0000 | INHALATION_SPRAY | Freq: Once | RESPIRATORY_TRACT | Status: AC
  Administered 2019-01-11: 11:00:00 8 via RESPIRATORY_TRACT
  Filled 2019-01-11: qty 6.7

## 2019-01-11 MED ORDER — ENSURE ENLIVE PO LIQD
237.0000 mL | Freq: Two times a day (BID) | ORAL | Status: DC
  Administered 2019-01-12: 237 mL via ORAL

## 2019-01-11 MED ORDER — ACETAMINOPHEN 650 MG RE SUPP: 650.0000 mg | Freq: Four times a day (QID) | RECTAL | Status: DC | PRN

## 2019-01-11 MED ORDER — AEROCHAMBER PLUS FLO-VU MISC
1.0000 | Freq: Once | Status: DC
  Filled 2019-01-11: qty 1

## 2019-01-11 MED ORDER — AEROCHAMBER PLUS FLO-VU LARGE MISC
1.0000 | Freq: Once | Status: AC
  Administered 2019-01-11: 1

## 2019-01-11 MED ORDER — ALBUTEROL SULFATE HFA 108 (90 BASE) MCG/ACT IN AERS: 8.0000 | INHALATION_SPRAY | RESPIRATORY_TRACT | Status: DC

## 2019-01-11 MED ORDER — ALBUTEROL SULFATE (2.5 MG/3ML) 0.083% IN NEBU: 3.0000 mL | INHALATION_SOLUTION | Freq: Four times a day (QID) | RESPIRATORY_TRACT | Status: DC | PRN

## 2019-01-11 MED ORDER — MOMETASONE FURO-FORMOTEROL FUM 200-5 MCG/ACT IN AERO
2.0000 | INHALATION_SPRAY | Freq: Two times a day (BID) | RESPIRATORY_TRACT | Status: DC
  Administered 2019-01-12 (×2): 2 via RESPIRATORY_TRACT
  Filled 2019-01-11 (×2): qty 8.8

## 2019-01-11 MED ORDER — CYCLOSPORINE 0.05 % OP EMUL
2.0000 [drp] | Freq: Every day | OPHTHALMIC | Status: DC
  Administered 2019-01-11 – 2019-01-12 (×2): 2 [drp] via OPHTHALMIC
  Filled 2019-01-11 (×2): qty 30

## 2019-01-11 MED ORDER — ENOXAPARIN SODIUM 40 MG/0.4ML ~~LOC~~ SOLN
40.0000 mg | SUBCUTANEOUS | Status: DC
  Administered 2019-01-11: 40 mg via SUBCUTANEOUS
  Filled 2019-01-11 (×2): qty 0.4

## 2019-01-11 MED ORDER — SENNOSIDES-DOCUSATE SODIUM 8.6-50 MG PO TABS: 1.0000 | ORAL_TABLET | Freq: Every evening | ORAL | Status: DC | PRN

## 2019-01-11 MED ORDER — ALBUTEROL SULFATE HFA 108 (90 BASE) MCG/ACT IN AERS
6.0000 | INHALATION_SPRAY | Freq: Once | RESPIRATORY_TRACT | Status: AC
  Administered 2019-01-11: 6 via RESPIRATORY_TRACT

## 2019-01-11 NOTE — ED Notes (Signed)
Daughter phone - [phone]

## 2019-01-11 NOTE — H&P (Addendum)
Date: 01/11/2019               Patient Name:  Mary Jenkins MRN: [id number]  DOB: 1956-04-08 Age / Sex: 63 y.o., female   PCP: Lars Mage, MD         Medical Service: Internal Medicine Teaching Service         Attending Physician: Dr. Lynnae January    First Contact: Dr. Eileen Stanford Pager: [phone]  Second Contact: Dr. Trilby Drummer Pager: [phone]       After Hours (After 5p/  First Contact Pager: [phone]  weekends / holidays): Second Contact Pager: [phone]   Chief Complaint: Dyspnea   History of Present Illness: [name] is a 63 year old African-American woman with oxygen dependent COPD GOLD IV (2L [place]), GERD, cervical degenerative joint disease who presented to [facility] with worsening dyspnea.  Ms. Silliman reports that she was in her usual state of health until yesterday evening when she began to gradually experience dyspnea.  On onset, she tried to ambulate to the bathroom and dyspnea worsened to the point where she was not able to walk back to her room more her nebulizer is located.  She denies cough, fevers, chills, upper respiratory viral symptoms, chest pain, lightheadedness, recent contacts, calf pain, long travel, wheezes.  She does live at home by herself however her daughter was with her yesterday during the onset of her symptoms and was able to administer her Symbicort treatment however this did not relieve her symptoms.  She states that over the past 5 days she has had increase her use of the Ventolin.  She is compliant with supplemental oxygen and uses 2 L nasal cannula at home and 3 L when she goes outside.  She reports compliance with all her other nebulizers.  She was last evaluated by Dr. Melvyn Novas (pulmonologist) on October 21, 2018 gave her detailed instructions on nebulizer administration.  She was completely asymptomatic when the internal medicine team evaluated her.  ED course: Afebrile, tachycardic with range 101-106, BP with range 85-136/50-94, SPO2 100%  on nonrebreather mask, BNP unremarkable i-STAT troponin unremarkable, CMP unremarkable, CBC without leukocytosis, rapid strep was coronavirus negative.  On route to the ED, EMS administered intramuscular epinephrine, albuterol, DuoNebs, Solu-Medrol, magnesium sulfate.  Meds:  Current Meds  Medication Sig  . albuterol (PROVENTIL) (2.5 MG/3ML) 0.083% nebulizer solution USE 3 ML IN NEBULIZER  EVERY 6 HOURS AS NEEDED FOR WHEEZING OR SHORTNESS OF BREATH (Patient taking differently: Take 2.5 mg by nebulization every 6 (six) hours as needed for wheezing or shortness of breath. )  . aspirin EC 81 MG tablet Take 81 mg by mouth daily.  . diclofenac sodium (VOLTAREN) 1 % GEL Apply 2 g topically 4 (four) times daily. (Patient taking differently: Apply 2 g topically 2 (two) times daily as needed (pain). )  . omeprazole (PRILOSEC) 20 MG capsule Take 1 capsule (20 mg total) by mouth 2 (two) times daily before a meal.  . OXYGEN Place 2-3 L into the nose 2 (two) times a day. 2lpm with rest and 3 with exertion   . RESTASIS 0.05 % ophthalmic emulsion Place 2 drops into both eyes daily.   . SYMBICORT 160-4.5 MCG/ACT inhaler Inhale 2 puffs by mouth twice daily  . Tiotropium Bromide Monohydrate (SPIRIVA RESPIMAT) 2.5 MCG/ACT AERS Inhale 2 puffs into the lungs daily.  . valACYclovir (VALTREX) 1000 MG tablet TAKE ONE TABLET BY MOUTH TWICE DAILY FOR 3 DAYS AS NEEDED (Patient taking differently: Take  1,000 mg by mouth 2 (two) times daily as needed (for 3 days as needed for outbreaks). )  . VENTOLIN HFA 108 (90 Base) MCG/ACT inhaler INHALE 2 PUFFS BY MOUTH EVERY 6 HOURS AS NEEDED FOR WHEEZING (Patient taking differently: Inhale 2 puffs into the lungs every 6 (six) hours as needed for wheezing. )     Allergies: Allergies as of 01/11/2019  . (No Known Allergies)   Past Medical History:  Diagnosis Date  . Breast cancer ([place])   . Cataract, immature    bilateral  . COPD (chronic obstructive pulmonary disease) ([employer])     no home O2  . Cough 02/09/2015  . DDD (degenerative disc disease), cervical   . Exertional shortness of breath    states if she "takes her time" doing activities does not get SOB  . Family history of adverse reaction to anesthesia    pt's sister has hx. of post-op N/V  . Full dentures   . GERD (gastroesophageal reflux disease) 02/03/2012  . High cholesterol    no current med.  Marland Kitchen History of breast cancer   . Personal history of chemotherapy 1997  . Runny nose 02/09/2015   clear drainage, per pt.    Family History: Reports a family history of hypertension, diabetes.  Social History: Previous heavy smoker however quit quit smoking in November 2014.  She is exposed to secondhand smoking as her daughter Oswaldo Milian) continues to smoke around her.  Despite herself.  She was born and raised in [place].  Review of Systems: A complete ROS was negative except as per HPI.   Review of Systems  Constitutional: Negative for chills and fever.  HENT: Negative for congestion and sinus pain.   Respiratory: Positive for shortness of breath. Negative for cough, sputum production and wheezing.   Gastrointestinal: Negative for abdominal pain, nausea and vomiting.  Skin: Negative for rash.  Neurological: Negative for dizziness, tingling, sensory change and headaches.  Psychiatric/Behavioral: Negative for depression.    Physical Exam: Blood pressure (!) 106/95, pulse (!) 105, temperature 98.2 F (36.8 C), temperature source Oral, resp. rate 19, height _0  (1.651 m), weight 44.9 kg, SpO2 97 %.  Physical Exam Vitals signs and nursing note reviewed.  Constitutional:      General: She is not in acute distress.    Appearance: She is well-developed. She is not toxic-appearing or diaphoretic.  HENT:     Head: Normocephalic and atraumatic.  Cardiovascular:     Rate and Rhythm: Regular rhythm. Tachycardia present.  No extrasystoles are present.    Heart sounds: No murmur.  Pulmonary:     Effort:  Pulmonary effort is normal.     Breath sounds: Normal breath sounds. No decreased breath sounds, wheezing, rhonchi or rales.     Comments: Good inspiratory effort, distant lung sounds Abdominal:     General: Bowel sounds are normal.     Palpations: Abdomen is soft.  Musculoskeletal:     Right lower leg: She exhibits no tenderness. No edema.     Left lower leg: She exhibits no tenderness. No edema.  Neurological:     Mental Status: She is alert.  Psychiatric:        Mood and Affect: Mood normal.        Behavior: Behavior normal.     EKG: personally reviewed my interpretation is sinus tachycardia  CXR: personally reviewed my interpretation is hyperinflated, no infiltration or consolidation  Assessment & Plan by Problem: Active Problems:   COPD exacerbation ([place])  Ms. Friese is a 63 year old African-American woman with oxygen dependent COPD GOLD IV (2L [place]), GERD, cervical degenerative joint disease here for management of acute exacerbation of COPD most likely secondary to secondhand smoke exposure.  #Dyspnea  #Acute exacerbation of COPD GOLD IV Presents with 1 day history of worsening dyspnea on exertion however denies cough, sputum production, fevers, chills, sick contact, URI symptoms, chest pain, lightheadedness,calf pain.  Less likely on the differential diagnosis are atypical pneumonia versus community-acquired pneumonia versus ACS versus pulmonary embolism.  COVID-19 less likely given negative finding, CBC without leukocytosis making an infectious cause less likely, troponin and BNP are all unremarkable. CXR also without consolidation or infiltration. The ongoing exacerbation is most likely due to her secondhand exposure to smoking from her daughter.  She is status post intramuscular epinephrine, albuterol, duo nebs, Solu-Medrol, magnesium sulfate - Continue 5-day course of prednisone 40 mg daily - Continue albuterol, Dulera, tiotropium bromide - Supplemental oxygen to maintain SPO2  between 88 to 92% - Continuous pulse ox monitoring - Obtain ambulatory pulse ox in the a.m.  #GERD: Continue Protonix 40 mg daily  FEN: Replace electrolytes as needed, regular diet VTE ppx: Subcutaneous Lovenox CODE STATUS: Full code  Dispo: Admit patient to Observation with expected length of stay less than 2 midnights.  Signed: Jean Rosenthal, MD 01/11/2019, 1:41 PM  Pager: [phone] IMTS PGY-1

## 2019-01-11 NOTE — Discharge Summary (Signed)
Name: Mary Jenkins MRN: [id number] DOB: [birth year] 63 y.o. PCP: Mary Mage, MD  Date of Admission: 01/11/2019  9:28 AM Date of Discharge: 01/12/2019 Attending Physician: Mary Crews, MD  Discharge Diagnosis: 1.  Acute exacerbation of COPD  Discharge Medications: Allergies as of 01/12/2019   No Known Allergies     Medication List    TAKE these medications   albuterol (2.5 MG/3ML) 0.083% nebulizer solution Commonly known as:  PROVENTIL USE 3 ML IN NEBULIZER  EVERY 6 HOURS AS NEEDED FOR WHEEZING OR SHORTNESS OF BREATH What changed:  See the new instructions.   Ventolin HFA 108 (90 Base) MCG/ACT inhaler Generic drug:  albuterol INHALE 2 PUFFS BY MOUTH EVERY 6 HOURS AS NEEDED FOR WHEEZING What changed:    how much to take  how to take this  when to take this  reasons to take this  additional instructions   aspirin EC 81 MG tablet Take 81 mg by mouth daily.   diclofenac sodium 1 % Gel Commonly known as:  Voltaren Apply 2 g topically 4 (four) times daily. What changed:    when to take this  reasons to take this   omeprazole 20 MG capsule Commonly known as:  PRILOSEC Take 1 capsule (20 mg total) by mouth 2 (two) times daily before a meal.   OXYGEN Place 2-3 L into the nose 2 (two) times a day. 2lpm with rest and 3 with exertion   predniSONE 20 MG tablet Commonly known as:  DELTASONE Take 2 tablets (40 mg total) by mouth daily with breakfast. Start taking on:  January 13, 2019   pregabalin 75 MG capsule Commonly known as:  Lyrica Take 1 capsule (75 mg total) by mouth 2 (two) times daily.   Restasis 0.05 % ophthalmic emulsion Generic drug:  cycloSPORINE Place 2 drops into both eyes daily.   Symbicort 160-4.5 MCG/ACT inhaler Generic drug:  budesonide-formoterol Inhale 2 puffs by mouth twice daily   Tiotropium Bromide Monohydrate 2.5 MCG/ACT Aers Commonly known as:  Spiriva Respimat Inhale 2 puffs into the lungs daily.   valACYclovir  1000 MG tablet Commonly known as:  VALTREX TAKE ONE TABLET BY MOUTH TWICE DAILY FOR 3 DAYS AS NEEDED What changed:    how much to take  how to take this  when to take this  reasons to take this  additional instructions       Disposition and follow-up:   MaryMary Jenkins was discharged from [facility] in [place] condition.  At the hospital follow up visit please address:  1.  Acute exacerbation of COPD: Ensure compliance with nebulizer and inhaler.  Abstain from secondhand smoke.  2.  Labs / imaging needed at time of follow-up: None  3.  Pending labs/ test needing follow-up: None  Follow-up Appointments:   Hospital Course by problem list: 1. Acute exacerbation of COPD GOLD IV: Mary Jenkins is a 63 year old African-American woman with oxygen dependent COPD GOLD IV on 2L [place], GERD, cervical degenerative joint disease who presented to [facility] emergency department on November 20, 2018 with a 1 day history of worsening dyspnea on exertion however she denied cough, sputum production, fevers, chills, sick contact, URI symptoms, chest pain, lightheadedness,calf pain.  Her acute onset dyspnea did not appear to be secondary to pneumonia, pulmonary embolism, acute coronary syndrome or COVID-19.  She did report of exposure to secondhand smoking.  On route to the hospital she received intramuscular epinephrine, albuterol, DuoNebs, Solu-Medrol, magnesium sulfate complete  resolution of symptoms.  During her hospitalization she maintain excellent oxygen saturation on her home supplemental oxygen.  She was continued on inhalers and discharged on a 5-day course of prednisone.  Discharge Vitals:   BP 95/69 (BP Location: Left Arm)   Pulse 82   Temp 98.1 F (36.7 C) (Oral)   Resp 16   Ht _0  (1.651 m)   Wt 41 kg   SpO2 100%   BMI 15.03 kg/m   Pertinent Labs, Studies, and Procedures:  FINDINGS: Normal cardiac silhouette. Lungs are hyperinflated. No nodularity. No focal  consolidation. No pleural fluid. No acute osseous abnormality.  IMPRESSION: Hyperinflated lungs.  No acute findings.  Discharge Instructions: Discharge Instructions    Call MD for:  difficulty breathing, headache or visual disturbances   Complete by:  As directed    Call MD for:  persistant dizziness or light-headedness   Complete by:  As directed    Diet - low sodium heart healthy   Complete by:  As directed    Discharge instructions   Complete by:  As directed    Mary Jenkins,   It was a pleasure taking care of you here in the hospital.  You were admitted to the hospital because of flareup of COPD which was due to secondhand smoking exposure.  Please continue taking your inhalers and nebulizer.  Discharging you on a 4-day therapy with prednisone 40 mg.  Take care.   Increase activity slowly   Complete by:  As directed       Signed: Jean Rosenthal, MD 01/12/2019, 11:19 AM   Pager: [phone] IMTS PGY-1

## 2019-01-11 NOTE — ED Triage Notes (Signed)
PT from Home ,Lives alone found by neighbors with [facility] and 911 was called. Meds given by EMS EPI 0.3 IM, albuterol 30m neb. ,Duoneb x1 , Solumedrol 125, MgSo4 2G in 50 NS. Pt changed to Home Nasal O2 at 2 liters Sats 99 %

## 2019-01-11 NOTE — ED Provider Notes (Signed)
[facility] EMERGENCY DEPARTMENT Provider Note   CSN: [id number] Arrival date & time: 01/11/19  0254    History   Chief Complaint Chief Complaint  Patient presents with  . Shortness of Breath    HPI Mary Jenkins is a 63 y.o. female.     HPI Patient is a 63 year old female with a history of oxygen dependent COPD who wears 2 L of oxygen daily.  911 was contacted for severe shortness of breath this morning.  She lives alone and was found by neighbors.  EMS reports severe respiratory distress on arrival.  Patient given intramuscular epinephrine, albuterol, DuoNeb, Solu-Medrol, magnesium in route with some improvement.  Still with increased work of breathing on arrival to the emergency department.  Patient does not smoke cigarettes but reports cigarette use in the house.  No recent fevers or cough.  Reports that her breathing was at baseline last night.  No known contact with COVID-19 patients or patients under investigation   Past Medical History:  Diagnosis Date  . Breast cancer ([place])   . Cataract, immature    bilateral  . COPD (chronic obstructive pulmonary disease) ([place])    no home O2  . Cough 02/09/2015  . DDD (degenerative disc disease), cervical   . Exertional shortness of breath    states if she "takes her time" doing activities does not get SOB  . Family history of adverse reaction to anesthesia    pt's sister has hx. of post-op N/V  . Full dentures   . GERD (gastroesophageal reflux disease) 02/03/2012  . High cholesterol    no current med.  Marland Kitchen History of breast cancer   . Personal history of chemotherapy 1997  . Runny nose 02/09/2015   clear drainage, per pt.    Patient Active Problem List   Diagnosis Date Noted  . Healthcare maintenance 05/23/2018  . Chronic respiratory failure with hypoxia ([place]) 04/13/2018  . Weight loss 08/02/2017  . COPD with acute exacerbation ([place]) 06/16/2017  . Severe episode of recurrent major depressive disorder,  without psychotic features ([place]) 04/25/2017  . Encounter for HCV screening test for low risk patient 03/10/2017  . History of hyperkalemia 02/13/2016  . Skin lesion of back 11/15/2014  . Atypical chest pain 10/21/2014  . Cataract 10/21/2014  . Dyslipidemia, goal to be determined 04/22/2014  . Lipoma 10/07/2012  . Cervical spine arthritis 08/27/2012  . History of breast cancer 07/23/2012  . GERD (gastroesophageal reflux disease) 02/03/2012  . COPD GOLD IV 06/24/2011  . Liver cyst 06/24/2011  . Insomnia 06/24/2011    Past Surgical History:  Procedure Laterality Date  . BREAST CAPSULOTOMY WITH IMPLANT EXCHANGE Right 06/21/2013   Procedure: REVISION RIGHT BREAST RECONSTRUCTION/REMOVAL OF RIGHT IMPLANT/RIGHT BREAST CAPSULOTOMY WITH INSERT TISSUE EXPLANDER RIGHT BREAST/POSSIBLE LEFT BREAST MASTOPEXY;  Surgeon: Cristine Polio, MD;  Location: [place];  Service: Plastics;  Laterality: Right;  . BREAST RECONSTRUCTION Right 06/21/2013   Procedure: BREAST RECONSTRUCTION;  Surgeon: Cristine Polio, MD;  Location: [place];  Service: Plastics;  Laterality: Right;  . BREAST RECONSTRUCTION Left 11/29/2013   Procedure: LEFT MASTOPEXY FOR RECONSTRUCTION;  Surgeon: Cristine Polio, MD;  Location: [place];  Service: Plastics;  Laterality: Left;  . BREAST RECONSTRUCTION Right 03/14/2014   Procedure: RECONSTRUCTION NIPPLE RIGHT BREAST;  Surgeon: Cristine Polio, MD;  Location: [place];  Service: Plastics;  Laterality: Right;  . BREAST SURGERY  97   implant  . CAPSULOTOMY Right 06/30/2017  Procedure: CAPSULOTOMY;  Surgeon: Cristine Polio, MD;  Location: [place];  Service: Plastics;  Laterality: Right;  . COLONOSCOPY    . MASTECTOMY, PARTIAL Right 1997   -node dissection  . REDUCTION MAMMAPLASTY Left   . REMOVAL OF TISSUE EXPANDER AND PLACEMENT OF IMPLANT Right 02/13/2015   Procedure: REMOVAL OF TISSUE  EXPANDER   PORT RIGHT BREAST;  Surgeon: Cristine Polio, MD;  Location: [place];  Service: Plastics;  Laterality: Right;  . REMOVAL OF TISSUE EXPANDER AND PLACEMENT OF IMPLANT Right 06/30/2017   Procedure: REMOVAL OF RIGHT TISSUE EXPANDER WITH PLACEMENT OF RIGHT GELL BREAST IMPLANTS;  Surgeon: Cristine Polio, MD;  Location: [place];  Service: Plastics;  Laterality: Right;  . SCAR REVISION Right 02/13/2015   Procedure: SCAR REVISION RIGHT BREAST;  Surgeon: Cristine Polio, MD;  Location: [place];  Service: Plastics;  Laterality: Right;  . TISSUE EXPANDER PLACEMENT Right 03/14/2014   Procedure: SALINE REMOVAL RIGHT TISSUE EXPANDER;  Surgeon: Cristine Polio, MD;  Location: [place];  Service: Plastics;  Laterality: Right;  . TUBAL LIGATION  1980's     OB History   No obstetric history on file.      Home Medications    Prior to Admission medications   Medication Sig Start Date End Date Taking? Authorizing Provider  albuterol (PROVENTIL) (2.5 MG/3ML) 0.083% nebulizer solution USE 3 ML IN NEBULIZER  EVERY 6 HOURS AS NEEDED FOR WHEEZING OR SHORTNESS OF BREATH 12/22/17   Tanda Rockers, MD  diclofenac sodium (VOLTAREN) 1 % GEL Apply 2 g topically 4 (four) times daily. 11/13/18   Lars Mage, MD  omeprazole (PRILOSEC) 20 MG capsule Take 1 capsule (20 mg total) by mouth 2 (two) times daily before a meal. 01/05/19   Tanda Rockers, MD  OXYGEN 2lpm with rest and 3 with exertion    [provider]  predniSONE (DELTASONE) 10 MG tablet Take  4 each am x 2 days,   2 each am x 2 days,  1 each am x 2 days and stop 10/21/18   Tanda Rockers, MD  pregabalin (LYRICA) 75 MG capsule Take 1 capsule (75 mg total) by mouth 2 (two) times daily. 11/13/18 02/11/19  Chundi, Verne Spurr, MD  RESTASIS 0.05 % ophthalmic emulsion Place 2 drops into both eyes daily.  11/09/14   [provider]  SYMBICORT 160-4.5 MCG/ACT inhaler Inhale 2 puffs by  mouth twice daily 12/10/18   Chundi, Verne Spurr, MD  Tiotropium Bromide Monohydrate (SPIRIVA RESPIMAT) 2.5 MCG/ACT AERS Inhale 2 puffs into the lungs daily. 10/21/18   Tanda Rockers, MD  valACYclovir (VALTREX) 1000 MG tablet TAKE ONE TABLET BY MOUTH TWICE DAILY FOR 3 DAYS AS NEEDED 01/17/18   Shelly Bombard, MD  VENTOLIN HFA 108 (90 Base) MCG/ACT inhaler INHALE 2 PUFFS BY MOUTH EVERY 6 HOURS AS NEEDED FOR WHEEZING 09/29/18   Lars Mage, MD    Family History Family History  Problem Relation Age of Onset  . Hypertension Mother   . Hypertension Sister   . Anesthesia problems Sister        post-op N/V  . Heart disease Brother   . Breast cancer Neg Hx     Social History Social History   Tobacco Use  . Smoking status: Former Smoker    Last attempt to quit: 07/23/2012    Years since quitting: 6.4  . Smokeless tobacco: Never Used  Substance Use Topics  . Alcohol use: No  Alcohol/week: 0.0 standard drinks  . Drug use: No     Allergies   Patient has no known allergies.   Review of Systems Review of Systems  Unable to perform ROS: Severe respiratory distress     Physical Exam Updated Vital Signs BP 108/72   Pulse (!) 101   Temp 98.2 F (36.8 C) (Oral)   Resp (!) 24   Ht 5' 5" (1.651 m)   Wt 44.9 kg   SpO2 98%   BMI 16.47 kg/m   Physical Exam Vitals signs and nursing note reviewed.  Constitutional:      General: She is not in acute distress.    Appearance: She is well-developed.  HENT:     Head: Normocephalic and atraumatic.  Neck:     Musculoskeletal: Normal range of motion.  Cardiovascular:     Rate and Rhythm: Regular rhythm. Tachycardia present.     Heart sounds: Normal heart sounds.  Pulmonary:     Effort: Respiratory distress present.     Breath sounds: No stridor. Wheezing present.  Chest:     Chest wall: No tenderness.  Abdominal:     General: There is no distension.     Palpations: Abdomen is soft.     Tenderness: There is no abdominal  tenderness.  Musculoskeletal: Normal range of motion.     Right lower leg: No edema.     Left lower leg: No edema.  Skin:    General: Skin is warm and dry.  Neurological:     Mental Status: She is alert and oriented to person, place, and time.  Psychiatric:        Judgment: Judgment normal.      ED Treatments / Results  Labs (all labs ordered are listed, but only abnormal results are displayed) Labs Reviewed  CBC WITH DIFFERENTIAL/PLATELET - Abnormal; Notable for the following components:      Result Value   RDW 16.3 (*)    Neutro Abs 8.1 (*)    All other components within normal limits  COMPREHENSIVE METABOLIC PANEL - Abnormal; Notable for the following components:   Glucose, Bld 143 (*)    All other components within normal limits  SARS CORONAVIRUS 2 (HOSPITAL ORDER, [facility])  TROPONIN I  BRAIN NATRIURETIC PEPTIDE    EKG EKG Interpretation  Date/Time:  Monday January 11 2019 09:30:53 EDT Ventricular Rate:  106 PR Interval:    QRS Duration: 86 QT Interval:  335 QTC Calculation: 445 R Axis:   89 Text Interpretation:  Sinus tachycardia Consider right atrial enlargement Borderline right axis deviation Artifact in lead(s) I II III aVR aVL aVF V1 V2 V3 V4 V5 V6 No significant change was found Confirmed by Jola Schmidt [phone]) on 01/11/2019 11:39:48 AM   Radiology Dg Chest Portable 1 View  Result Date: 01/11/2019 CLINICAL DATA:  Short of breath, cough EXAM: PORTABLE CHEST 1 VIEW COMPARISON:  CT 02/01/2018 FINDINGS: Normal cardiac silhouette. Lungs are hyperinflated. No nodularity. No focal consolidation. No pleural fluid. No acute osseous abnormality. IMPRESSION: Hyperinflated lungs.  No acute findings. Electronically Signed   By: Suzy Bouchard M.D.   On: 01/11/2019 09:51    Procedures .Critical Care Performed by: Jola Schmidt, MD Authorized by: Jola Schmidt, MD   Critical care provider statement:    Critical care time (minutes):  33    Critical care was time spent personally by me on the following activities:  Discussions with consultants, evaluation of patient's response to treatment, examination  of patient, ordering and performing treatments and interventions, ordering and review of laboratory studies, ordering and review of radiographic studies, pulse oximetry, re-evaluation of patient's condition, obtaining history from patient or surrogate and review of old charts   (including critical care time)  Medications Ordered in ED Medications  ipratropium (ATROVENT HFA) inhaler 2 puff (2 puffs Inhalation Given 01/11/19 1053)  albuterol (VENTOLIN HFA) 108 (90 Base) MCG/ACT inhaler 8 puff (8 puffs Inhalation Given 01/11/19 1049)  AeroChamber Plus Flo-Vu Large MISC 1 each (1 each Other Given 01/11/19 1057)     Initial Impression / Assessment and Plan / ED Course  I have reviewed the triage vital signs and the nursing notes.  Pertinent labs & imaging results that were available during my care of the patient were reviewed by me and considered in my medical decision making (see chart for details).       Respiratory distress.  Improving on arrival to the emergency department but still with labored breathing.  Additional bronchodilators and Atrovent given.  Patient improving here in the emergency department.  She still feels like she would benefit from additional albuterol and does not feel ready to go home.  Given her gold COPD she will be admitted for COPD exacerbation.  Her coronavirus testing came back negative.  Final Clinical Impressions(s) / ED Diagnoses   Final diagnoses:  COPD exacerbation [facility])    ED Discharge Orders    None       Jola Schmidt, MD 01/11/19 1200

## 2019-01-11 NOTE — Progress Notes (Signed)
01/11/2019 Patient came to the emergency room to 2W at 1505. She is alert, oriented and ambulatory. Skin was assess and it was intact. [facility].

## 2019-01-11 NOTE — ED Notes (Signed)
ED TO INPATIENT HANDOFF REPORT  ED Nurse Name and Phone #: [phone]  S Name/Age/Gender Mary Jenkins 63 y.o. female Room/Bed: 023C/023C  Code Status   Code Status: Full Code  Home/SNF/Other Home Patient oriented to: self, place, time and situation Is this baseline? Yes   Triage Complete: Triage complete  Chief Complaint sob  Triage Note PT from Home ,Lives alone found by neighbors with [facility] and 911 was called. Meds given by EMS EPI 0.3 IM, albuterol 39m neb. ,Duoneb x1 , Solumedrol 125, MgSo4 2G in 50 NS. Pt changed to Home Nasal O2 at 2 liters Sats 99 %   Allergies No Known Allergies  Level of Care/Admitting Diagnosis ED Disposition    [id number]  Level of Care: Med-Surg [16]  Covid Evaluation: N/A  Diagnosis: COPD exacerbation ([facility] [[[id number]] Admitting Physician: BAline Brochure Attending Physician: BLarey DresserA [[id number]]  PT Class (Do Not Modify): Observation [104]  PT Acc Code (Do Not Modify): Observation [10022]       B Medical/Surgery History Past Medical History:  Diagnosis Date  . Breast cancer ([place]   . Cataract, immature    bilateral  . COPD (chronic obstructive pulmonary disease) ([place]    no home O2  . Cough 02/09/2015  . DDD (degenerative disc disease), cervical   . Exertional shortness of breath    states if she "takes her time" doing activities does not get SOB  . Family history of adverse reaction to anesthesia    pt's sister has hx. of post-op N/V  . Full dentures   . GERD (gastroesophageal reflux disease) 02/03/2012  . High cholesterol    no current med.  .Marland KitchenHistory of breast cancer   . Personal history of chemotherapy 1997  . Runny nose 02/09/2015   clear drainage, per pt.   Past Surgical History:  Procedure Laterality Date  . BREAST CAPSULOTOMY WITH IMPLANT EXCHANGE Right 06/21/2013   Procedure: REVISION RIGHT BREAST  RECONSTRUCTION/REMOVAL OF RIGHT IMPLANT/RIGHT BREAST CAPSULOTOMY WITH INSERT TISSUE EXPLANDER RIGHT BREAST/POSSIBLE LEFT BREAST MASTOPEXY;  Surgeon: GCristine Polio MD;  [place]  Service: Plastics;  Laterality: Right;  . BREAST RECONSTRUCTION Right 06/21/2013   Procedure: BREAST RECONSTRUCTION;  Surgeon: GCristine Polio MD;  [place]  Service: Plastics;  Laterality: Right;  . BREAST RECONSTRUCTION Left 11/29/2013   Procedure: LEFT MASTOPEXY FOR RECONSTRUCTION;  Surgeon: GCristine Polio MD;  [place]  Service: Plastics;  Laterality: Left;  . BREAST RECONSTRUCTION Right 03/14/2014   Procedure: RECONSTRUCTION NIPPLE RIGHT BREAST;  Surgeon: GCristine Polio MD;  [place]  Service: Plastics;  Laterality: Right;  . BREAST SURGERY  97   implant  . CAPSULOTOMY Right 06/30/2017   Procedure: CAPSULOTOMY;  Surgeon: TCristine Polio MD;  [place]  Service: Plastics;  Laterality: Right;  . COLONOSCOPY    . MASTECTOMY, PARTIAL Right 1997   -node dissection  . REDUCTION MAMMAPLASTY Left   . REMOVAL OF TISSUE EXPANDER AND PLACEMENT OF IMPLANT Right 02/13/2015   Procedure: REMOVAL OF TISSUE  EXPANDER  PORT RIGHT BREAST;  Surgeon: GCristine Polio MD;  [place]  Service: Plastics;  Laterality: Right;  . REMOVAL OF TISSUE EXPANDER AND PLACEMENT OF IMPLANT Right 06/30/2017   Procedure: REMOVAL OF RIGHT TISSUE EXPANDER WITH PLACEMENT OF RIGHT GELL BREAST IMPLANTS;  Surgeon: TCristine Polio  MD;  Location: [place];  Service: Plastics;  Laterality: Right;  . SCAR REVISION Right 02/13/2015   Procedure: SCAR REVISION RIGHT BREAST;  Surgeon: Cristine Polio, MD;  Location: [place];  Service: Plastics;  Laterality: Right;  . TISSUE EXPANDER PLACEMENT Right 03/14/2014   Procedure: SALINE REMOVAL RIGHT TISSUE  EXPANDER;  Surgeon: Cristine Polio, MD;  Location: [place];  Service: Plastics;  Laterality: Right;  . TUBAL LIGATION  1980's     A IV Location/Drains/Wounds Patient Lines/Drains/Airways Status   Active Line/Drains/Airways    Name:   Placement date:   Placement time:   Site:   Days:   Peripheral IV 01/11/19 Right Antecubital   01/11/19    1110    Antecubital   less than 1   Incision (Closed) 06/30/17 Breast Right   06/30/17    0800     560          Intake/Output Last 24 hours No intake or output data in the 24 hours ending 01/11/19 1348  Labs/Imaging Results for orders placed or performed during the hospital encounter of 01/11/19 (from the past 48 hour(s))  SARS Coronavirus 2 [facility] order, Performed in [place] hospital lab)     Status: None   Collection Time: 01/11/19  9:36 AM  Result Value Ref Range   SARS Coronavirus 2 NEGATIVE NEGATIVE    Comment: (NOTE) If result is NEGATIVE SARS-CoV-2 target nucleic acids are NOT DETECTED. The SARS-CoV-2 RNA is generally detectable in upper and lower  respiratory specimens during the acute phase of infection. The lowest  concentration of SARS-CoV-2 viral copies this assay can detect is 250  copies / mL. A negative result does not preclude SARS-CoV-2 infection  and should not be used as the sole basis for treatment or other  patient management decisions.  A negative result may occur with  improper specimen collection / handling, submission of specimen other  than nasopharyngeal swab, presence of viral mutation(s) within the  areas targeted by this assay, and inadequate number of viral copies  (<250 copies / mL). A negative result must be combined with clinical  observations, patient history, and epidemiological information. If result is POSITIVE SARS-CoV-2 target nucleic acids are DETECTED. The SARS-CoV-2 RNA is generally detectable in upper and lower  respiratory specimens dur ing the acute phase of  infection.  Positive  results are indicative of active infection with SARS-CoV-2.  Clinical  correlation with patient history and other diagnostic information is  necessary to determine patient infection status.  Positive results do  not rule out bacterial infection or co-infection with other viruses. If result is PRESUMPTIVE POSTIVE SARS-CoV-2 nucleic acids MAY BE PRESENT.   A presumptive positive result was obtained on the submitted specimen  and confirmed on repeat testing.  While 2019 novel coronavirus  (SARS-CoV-2) nucleic acids may be present in the submitted sample  additional confirmatory testing may be necessary for epidemiological  and / or clinical management purposes  to differentiate between  SARS-CoV-2 and other Sarbecovirus currently known to infect humans.  If clinically indicated additional testing with an alternate test  methodology [phone]) is advised. The SARS-CoV-2 RNA is generally  detectable in upper and lower respiratory sp ecimens during the acute  phase of infection. The expected result is Negative. Fact Sheet for Patients:  StrictlyIdeas.no Fact Sheet for Healthcare Providers: BankingDealers.co.za This test is not yet approved or cleared by the [place] FDA and has been authorized for detection and/or  diagnosis of SARS-CoV-2 by FDA under an Emergency Use Authorization (EUA).  This EUA will remain in effect (meaning this test can be used) for the duration of the COVID-19 declaration under Section 564(b)(1) of the Act, 21 U.S.C. section 360bbb-3(b)(1), unless the authorization is terminated or revoked sooner. Performed at [facility], [place] [address], [place], [place] [address]   CBC with Differential/Platelet     Status: Abnormal   Collection Time: 01/11/19  9:59 AM  Result Value Ref Range   WBC 10.0 4.0 - 10.5 K/uL   RBC 4.67 3.87 - 5.11 MIL/uL   Hemoglobin 12.4 12.0 - 15.0 g/dL   HCT 40.1 36.0  - 46.0 %   MCV 85.9 80.0 - 100.0 fL   MCH 26.6 26.0 - 34.0 pg   MCHC 30.9 30.0 - 36.0 g/dL   RDW 16.3 (H) 11.5 - 15.5 %   Platelets 273 150 - 400 K/uL   nRBC 0.0 0.0 - 0.2 %   Neutrophils Relative % 82 %   Neutro Abs 8.1 (H) 1.7 - 7.7 K/uL   Lymphocytes Relative 11 %   Lymphs Abs 1.1 0.7 - 4.0 K/uL   Monocytes Relative 6 %   Monocytes Absolute 0.6 0.1 - 1.0 K/uL   Eosinophils Relative 1 %   Eosinophils Absolute 0.1 0.0 - 0.5 K/uL   Basophils Relative 0 %   Basophils Absolute 0.0 0.0 - 0.1 K/uL   Immature Granulocytes 0 %   Abs Immature Granulocytes 0.03 0.00 - 0.07 K/uL    Comment: Performed at [facility] [address] [place], [place] [address]  Comprehensive metabolic panel     Status: Abnormal   Collection Time: 01/11/19  9:59 AM  Result Value Ref Range   Sodium 142 135 - 145 mmol/L   Potassium 4.1 3.5 - 5.1 mmol/L   Chloride 105 98 - 111 mmol/L   CO2 25 22 - 32 mmol/L   Glucose, Bld 143 (H) 70 - 99 mg/dL   BUN 14 8 - 23 mg/dL   Creatinine, Ser 1.00 0.44 - 1.00 mg/dL   Calcium 9.0 8.9 - 10.3 mg/dL   Total Protein 6.8 6.5 - 8.1 g/dL   Albumin 3.7 3.5 - 5.0 g/dL   AST 26 15 - 41 U/L   ALT 17 0 - 44 U/L   Alkaline Phosphatase 52 38 - 126 U/L   Total Bilirubin 0.6 0.3 - 1.2 mg/dL   GFR calc non Af Amer >60 >60 mL/min   GFR calc Af Amer >60 >60 mL/min   Anion gap 12 5 - 15    Comment: Performed at [facility] [address] [place], [place] [address]  Troponin I - ONCE - STAT     Status: None   Collection Time: 01/11/19  9:59 AM  Result Value Ref Range   Troponin I <0.03 <0.03 ng/mL    Comment: Performed at [facility] [address], [place], [place] [address]  Brain natriuretic peptide     Status: None   Collection Time: 01/11/19  9:59 AM  Result Value Ref Range   B Natriuretic Peptide 34.6 0.0 - 100.0 pg/mL    Comment: Performed at [facility] [address], [place],  [address]   Dg Chest Portable 1 View  Result Date:  01/11/2019 CLINICAL DATA:  Short of breath, cough EXAM: PORTABLE CHEST 1 VIEW COMPARISON:  CT 02/01/2018 FINDINGS: Normal cardiac silhouette. Lungs are hyperinflated. No nodularity.  No focal consolidation. No pleural fluid. No acute osseous abnormality. IMPRESSION: Hyperinflated lungs.  No acute findings. Electronically Signed   By: Suzy Bouchard M.D.   On: 01/11/2019 09:51    Pending Labs Unresulted Labs (From admission, onward)    Start     Ordered   01/18/19 0500  Creatinine, serum  (enoxaparin (LOVENOX)    CrCl >/= 30 ml/min)  Weekly,   R    Comments:  while on enoxaparin therapy    01/11/19 1331          Vitals/Pain Today's Vitals   01/11/19 1245 01/11/19 1300 01/11/19 1315 01/11/19 1327  BP: 97/71 118/76 (!) 106/95   Pulse: (!) 101 (!) 106 (!) 105   Resp: 17  19   Temp:      TempSrc:      SpO2: 99% 99% 97%   Weight:      Height:      PainSc:    Asleep    Isolation Precautions Droplet and Contact precautions  Medications Medications  enoxaparin (LOVENOX) injection 40 mg (has no administration in time range)  acetaminophen (TYLENOL) tablet 650 mg (has no administration in time range)    Or  acetaminophen (TYLENOL) suppository 650 mg (has no administration in time range)  senna-docusate (Senokot-S) tablet 1 tablet (has no administration in time range)  pantoprazole (PROTONIX) EC tablet 40 mg (has no administration in time range)  mometasone-formoterol (DULERA) 200-5 MCG/ACT inhaler 2 puff (has no administration in time range)  Tiotropium Bromide Monohydrate AERS 2 puff (has no administration in time range)  albuterol (PROVENTIL) (2.5 MG/3ML) 0.083% nebulizer solution 3 mL (has no administration in time range)  cycloSPORINE (RESTASIS) 0.05 % ophthalmic emulsion 2 drop (has no administration in time range)  ipratropium (ATROVENT HFA) inhaler 2 puff (2 puffs Inhalation Given 01/11/19 1053)  albuterol (VENTOLIN HFA) 108 (90 Base) MCG/ACT inhaler 8 puff (8 puffs  Inhalation Given 01/11/19 1049)  AeroChamber Plus Flo-Vu Large MISC 1 each (1 each Other Given 01/11/19 1057)  albuterol (VENTOLIN HFA) 108 (90 Base) MCG/ACT inhaler 6 puff (6 puffs Inhalation Given 01/11/19 1213)    Mobility walks Low fall risk   Focused Assessments Pulmonary Assessment Handoff:  Lung sounds: Bilateral Breath Sounds: Diminished O2 Device: Nasal Cannula O2 Flow Rate (L/min): 2 L/min      R Recommendations: See Admitting Provider Note  Report given to:   Additional Notes:

## 2019-01-11 NOTE — Plan of Care (Signed)

## 2019-01-12 DIAGNOSIS — K219 Gastro-esophageal reflux disease without esophagitis: Secondary | ICD-10-CM | POA: Diagnosis not present

## 2019-01-12 DIAGNOSIS — Z7951 Long term (current) use of inhaled steroids: Secondary | ICD-10-CM | POA: Diagnosis not present

## 2019-01-12 DIAGNOSIS — J441 Chronic obstructive pulmonary disease with (acute) exacerbation: Secondary | ICD-10-CM

## 2019-01-12 DIAGNOSIS — Z9981 Dependence on supplemental oxygen: Secondary | ICD-10-CM

## 2019-01-12 DIAGNOSIS — Z87891 Personal history of nicotine dependence: Secondary | ICD-10-CM | POA: Diagnosis not present

## 2019-01-12 DIAGNOSIS — Z79899 Other long term (current) drug therapy: Secondary | ICD-10-CM | POA: Diagnosis not present

## 2019-01-12 MED ORDER — ENOXAPARIN SODIUM 30 MG/0.3ML ~~LOC~~ SOLN: 30.0000 mg | SUBCUTANEOUS | Status: DC

## 2019-01-12 MED ORDER — PREDNISONE 20 MG PO TABS: 40.0000 mg | ORAL_TABLET | Freq: Every day | ORAL | 0 refills | Status: DC

## 2019-01-12 NOTE — Care Management Obs Status (Signed)
[place] NOTIFICATION   Patient Details  Name: Mary Jenkins MRN: [id number] Date of Birth: 06-Dec-1955   Medicare Observation Status Notification Given:  Yes    Zenon Mayo, RN 01/12/2019, 9:50 AM

## 2019-01-12 NOTE — Progress Notes (Signed)
SATURATION QUALIFICATIONS: (This note is used to comply with regulatory documentation for home oxygen)  Patient Saturations on Room Air at Rest. Did not assess  Patient Saturations on Room Air while AmbulatingDid not assess  Patient Saturations on 2 Liters of oxygen while Ambulating 98%  Please briefly explain why patient needs home oxygen:Pt is on 2 liters of oxygen prior to admission

## 2019-01-12 NOTE — Progress Notes (Signed)
Subjective: HD#0   Overnight: She remained on her home oxygen requirement of 2L [place]  Today she feels well. Denies fever, chills, shortness of breath. She did have a slight cough after drinking ginger ale. She has walked to the bathroom without feeling short of breath. Counseled on limiting second hand smoke exposure. Plan to ambulate for RN this morning to measure pulse ox. If all goes well, will d/c home today.   Objective:  Vital signs in last 24 hours: Vitals:   01/11/19 1729 01/11/19 2000 01/11/19 2150 01/11/19 2300  BP:    100/64  Pulse:    (!) 101  Resp:    16  Temp:    97.7 F (36.5 C)  TempSrc:    Oral  SpO2:  99% 100% 100%  Weight: 41 kg     Height: 5' 5" (1.651 m)      Const: In NAD, lying comfortably in bed Resp: CTABL, no wheezes or rales CV: RRR, no MGR  Assessment/Plan:  Active Problems:   COPD exacerbation ([place])  Mary Jenkins is a 63 year old African-American woman with oxygen dependent COPD GOLD IV (2L [place]), GERD, cervical degenerative joint disease here for management of acute exacerbation of COPD most likely secondary to secondhand smoke exposure.  #Acute exacerbation of COPD GOLD IV She has had an unremarkable clinical course since admission. No reports of SOB, cough, sputum production.  - Continue 5-day course of prednisone 40 mg daily - Continue albuterol, Dulera, tiotropium bromide - Supplemental oxygen to maintain SPO2 between 88 to 92% - Continuous pulse ox monitoring - Ambulatory pulse Ox today. If reassuring, she will be stable for discharge   #GERD: Continue Protonix 40 mg daily  FEN: Replace electrolytes as needed, regular diet VTE ppx: Subcutaneous Lovenox CODE STATUS: Full code  Dispo: Anticipated discharge today.   Jean Rosenthal, MD 01/12/2019, 6:07 AM Pager: [phone] [facility] PGY-1

## 2019-01-12 NOTE — Progress Notes (Signed)
  Date: 01/12/2019  Patient name: TULA SCHRYVER  Medical record number: [id number]  Date of birth: 1956-03-12   I have seen and evaluated [name] and discussed their care with the Residency Team.  Ms. Hertzberg is a 63 year old woman with severe oxygen dependent COPD Gold stage IV.  PFTs in December 2018 showed an FEV1 FVC ratio of 31%, FEV1 of 21%, and insignificant response to bronchodilators, and a residual volume of 244% predicted.  She had onset of dyspnea after being exposed to secondhand smoke.  EMS administered epinephrine, albuterol, duo nebs, Solu-Medrol, and magnesium.  She was asymptomatic in the inpatient team evaluated her in the ED but due to her severe underlying COPD, overnight admission was felt to be prudent.  This morning, she remains feeling well and was able to ambulate to the bathroom independently.  PMHx, Fam Hx, and/or Soc Hx : Severe oxygen dependent COPD Gold stage IV.  She quit smoking in November of 2014.  Vitals:   01/11/19 2300 01/12/19 0741  BP: 100/64 95/69  Pulse: (!) 101 82  Resp: 16 16  Temp: 97.7 F (36.5 C) 98.1 F (36.7 C)  SpO2: 100% 100%  General sitting in bed in no acute distress.  She is able to speak in full sentences.  She is not using any accessory muscles. Heart regular rate and rhythm no murmur rubs or gallops Lungs clear to auscultation bilaterally with good airflow  WBC 10 COVID negative  I personally viewed the CXR images and confirmed my reading with the official read.  One-view portable, slight rotation.  Hyperinflated lungs bilaterally but no infiltrate.  I personally viewed the EKG and confirmed my reading with the official read.  Sinus tachycardia, indeterminate axis due to poor baseline, right atrial enlargement, left atrial enlargement.  Assessment and Plan: I have seen and evaluated the patient as outlined above. I agree with the formulated Assessment and Plan as detailed in the residents' note, with the following changes:   Ms. Mccosh is a 63 year old woman with severe oxygen dependent COPD Gold stage IV who was admitted for a self-limited COPD exacerbation but due to her severe underlying COPD, and overnight observation was felt to be prudent.  This morning, she has not had a resumption of her symptoms and is stable to be discharged home.  1.  COPD exacerbation - she will complete a 5-day course of prednisone 40 mg a day.  Antibiotics are not indicated as she has no infiltrate on chest x-ray and no increased sputum or purulent sputum.  She has all of her home medications to continue.  Discharge to home today.  Bartholomew Crews, MD 4/21/20209:42 AM

## 2019-01-21 ENCOUNTER — Telehealth: Payer: Self-pay | Admitting: Internal Medicine

## 2019-01-21 NOTE — Telephone Encounter (Signed)
Since covid19 neg needs ov asap with all meds/ nebs/inhalers in hand - I can't treat this over  the phone better than the [facility] rx she already received as inpt

## 2019-01-21 NOTE — Telephone Encounter (Signed)
Called and spoke with pt letting her know that MW wants [place] to schedule in-office visit for her with him so he can further evaluate her symptoms. Pt expressed understanding. appt has been scheduled for pt with MW tomorrow, 5/1 at 3:15. Nothing further needed.

## 2019-01-21 NOTE — Telephone Encounter (Signed)
Called and spoke with pt who stated she has had chest tightness since being released from the hospital 4/21.  Pt denies any real complaints of SOB and also denies any real complaints of cough. Pt stated she did a neb treatment but did not fully finish doing the treatment.  Pt is wanting to have something prescribed to help with the chest tightness. Dr. Melvyn Novas, please advise on this for pt. Thanks!

## 2019-01-22 ENCOUNTER — Ambulatory Visit (INDEPENDENT_AMBULATORY_CARE_PROVIDER_SITE_OTHER): Payer: Medicare Other | Admitting: Internal Medicine

## 2019-01-22 ENCOUNTER — Other Ambulatory Visit: Payer: Self-pay

## 2019-01-22 ENCOUNTER — Encounter: Payer: Self-pay | Admitting: Internal Medicine

## 2019-01-22 VITALS — BP 88/54 | HR 108 | Temp 98.0°F | Ht 65.0 in | Wt 94.0 lb

## 2019-01-22 DIAGNOSIS — J441 Chronic obstructive pulmonary disease with (acute) exacerbation: Secondary | ICD-10-CM

## 2019-01-22 DIAGNOSIS — J9611 Chronic respiratory failure with hypoxia: Secondary | ICD-10-CM

## 2019-01-22 MED ORDER — TIOTROPIUM BROMIDE MONOHYDRATE 2.5 MCG/ACT IN AERS: 2.0000 | INHALATION_SPRAY | Freq: Every day | RESPIRATORY_TRACT | 0 refills | Status: DC

## 2019-01-22 MED ORDER — PREDNISONE 10 MG PO TABS: ORAL_TABLET | ORAL | 0 refills | Status: DC

## 2019-01-22 NOTE — Assessment & Plan Note (Addendum)
Quit smoking 06/2011  PFT performed on 06/27/2011. Poor quality.    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 prior to study with DLCO  24 % corrects to 33 % for alv volume    - referred to rehab again 11/26/2017 > could not afford it   - The proper method of use, as well as anticipated side effects, of a metered-dose inhaler are discussed and demonstrated to the patient. Improved effectiveness after extensive coaching during this visit to a level of approximately 90 % from a baseline of 50 % with smi and hfa    Admit 01/11/19 with a typical flare and extremely poor insight/ adherence to action plans previously discussed and reviewed in writing.  That is, when perceived need for saba occurs for any reason it should trigger a phone call for appt and if ever need the neb to back up the albuterol need to call immediately, no p 5 days of the saba hfa not working to her satisfaction    DDX of  difficult airways management almost all start with A and  include Adherence, Ace Inhibitors, Acid Reflux, Active Sinus Disease, Alpha 1 Antitripsin deficiency, Anxiety masquerading as Airways dz,  ABPA,  Allergy(esp in young), Aspiration (esp in elderly), Adverse effects of meds,  Active smoking or vaping, A bunch of PE's (a small clot burden can't cause this syndrome unless there is already severe underlying pulm or  vascular dz with poor reserve) plus two Bs  = Bronchiectasis and Beta blocker use..and one C= CHF   Adherence is always the initial "prime suspect" and is a multilayered concern that requires a "trust but verify" approach in every patient - starting with knowing how to use medications, especially inhalers, correctly, keeping up with refills and understanding the fundamental difference between maintenance and prns vs those medications only taken for a very short course and then stopped and not refilled.  - see hfa teaching  - return with all meds in hand using a trust but verify approach to confirm accurate Medication  Reconciliation The principal here is that until we are certain that the  patients are doing what we've asked, it makes no sense to ask them to do more.   ? Acid (or non-acid) GERD > always difficult to exclude as up to 75% of pts in some series report no assoc GI/ Heartburn symptoms> rec continue max (24h)  acid suppression and diet restrictions/ reviewed     ? Anxiety > usually at the bottom of this list of usual suspects but should be much higher on this pt's based on H and P  and may interfere with adherence and also interpretation of response  or lack thereof to symptom management which can be quite subjective.   ? Active smoking > if flares again need to check urinary nicotine metabolites  ? Allergy /asthma flare > Eos not elevated at admit but suggested by transient resp to short term pred > extend to 12 day course and continue high dose ICS  ?  A bunch of PE's > initial response to prednisone and worse since d/c rules against though no d dimer at last flare and needs to be kept in dd x going forward esp if not responding as she has in past to rx for aecopd - in this case she wasn't using meds correctly and got a much shorter course of prednisone which are more likely the source of her flare   ? Chf/ihd > trop and bnp nl during flare rules out

## 2019-01-22 NOTE — Patient Instructions (Signed)
Plan A = Automatic = symbicort 160 Take 2 puffs first thing in am and then another 2 puffs about 12 hours later and spiriva 2 pffs each am   Work on inhaler technique:  relax and gently blow all the way out then take a nice smooth deep breath back in, triggering the inhaler at same time you start breathing in.  Hold for up to 5 seconds if you can. Blow out thru nose. Rinse and gargle with water when done      Plan B = Backup Only use your albuterol (proair or ventolin) inhaler as a rescue medication to be used if you can't catch your breath by resting or doing a relaxed purse lip breathing pattern.  - The less you use it, the better it will work when you need it. - Ok to use the inhaler up to 2 puffs  every 4 hours if you must but call for appointment if use goes up over your usual need - Don't leave home without it !!  (think of it like the spare tire for your car)   Plan C = Crisis - only use your albuterol nebulizer if you first try Plan B and it fails to help > ok to use the nebulizer up to every 4 hours but if start needing it regularly call for immediate appointment   Prednisone 10 mg Take 4 for three days 3 for three days 2 for three days 1 for three days and stop  Omeprazole 20 mg Take 30- 60 min before your first and last meals of the day     Keep the appt you already have

## 2019-01-22 NOTE — Progress Notes (Signed)
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 No real variability, cough is better with no excess or purulent sputum.  Not using saba daytime because finds if she's holding still doesn't need it as oftern. Some better p last  Prednisone rx. Not much variability. rec Stop atrovent and advair Start symbiocort Take 2 puffs first thing in am and then another 2 puffs about 12 hours later.  Take after only am dose Spiriva Protonix 40 mg Take 30-60 min before first meal of the day  GERD diet Only use your albuterol (Plan B= ventolin puffer,  Plan C is nebulizer) as a rescue medication to be used if you can't catch your breath by resting or doing a relaxed purse lip breathing pattern. The less you use it, the better it will work when you need it. Ok to use rescue up to every 4 hours if doing poorly.       12/18/2017  f/u ov/Mary Jenkins re:  COPD IV/ symb/ spiriva tapering pred  Chief Complaint  Patient presents with  . Follow-up    SOB with activity, productive cough -yellow thick mucus, slight wheezing at night more   Dyspnea:  MMRC3 = can't walk 100 yards even at a slow pace at a flat grade s stopping due to sob  Cough: congested cough  Sleep: ok flat most noct SABA use:  Not using  In any form  Rec Work on inhaler technique:  Finish prednisone as you  plan  For cough > mucinex dm up to 1200 mg daily and use flutter valve as much as possible  Whenever cough flares should be on priloec 20 mg Take 30- 60 min before  your first and last meals of the day then ok when cough better for a week to leave the prilosec back off  Please schedule a follow up office visit in 4 weeks, sooner if needed  with all medications /inhalers/ solutions in hand so we can verify exactly what you are taking. This includes all medications from all doctors and over the counters     01/15/2018  f/u ov/Mary Jenkins re: copd iv/ did not bring meds  Chief Complaint  Patient presents with  . Follow-up    Reports increased SOB since weather has changed. Reports her daughter used some kind of incense mist and her breathing has been worse every since. No improvement noted with neb or inhaler.   Dyspnea:  Still MMRC3 = can't walk 100 yards even at a slow pace at a flat grade s stopping due to sob   Cough: worse with certain foods / exp to incense / non prod Sleep:   No 02 / flat ok  SABA use:  Last used 2 weeks prior to OV   Feels much better on prednisone  And worse off it  Taking ppi hs       07/14/2018  f/u ov/Mary Jenkins re:  Copd GOLD IV/ 02 dep / ? slt flare p  uri exp  Chief Complaint  Patient presents with  . Follow-up    Increased SOB recently- relates to her grandson having a cold. She has some chest tightness. She is using her albuterol inhaler 2 x daily and rarely use her neb.    Dyspnea:  Food[employer] on 3lpm poc but does not check sats Cough: min cough / nothing purulent Sleeping: flat / no pillows SABA use: as above/ chest tightness better p saba though hfa ti too short - see a/p  02: 2lpm at hs / 3lpm poc  / going back to school and present POC too noisy so turns it off then rec Spiriva should be 4 puffs of the blue or 2 pffs of the green  Tipped  spiriva each am Prednisone 10 mg take  4 each am x 2 days,   2 each am x 2 days,  1 each am x 2 days and stop   Continue omeprazole 20 mg Take 30-60 min before first meal of the day but add another dose 30 min supper for respiratory flares Plan B = Backup Only use your albuterol (ventolin)  as a rescue medication  Plan C = Crisis - only use your albuterol nebulizer if you first try Plan B and it fails to help > ok to use the nebulizer up to every 4 hours but if start needing it regularly call for immediate appointment   10/21/2018  f/u ov/Mary Jenkins re:  Copd GOLD IV /  Chief Complaint  Patient presents with  . Follow-up    Pt c/o increased cough and SOB over the past month.  She is using the ventolin inhaler multiple times per day. She rarely uses neb.    Dyspnea:  Last food lion x few months Cough: more congestion/ esp in am and hs  Sleeping: prop up 30 degrees vs before  SABA use: as above, much more since ran out of spiriva  02: 2lpm hs,  None sitting,  2-3 poc  rec Plan A = Automatic = symbicort / spiriva 2 pffs of each first thing in am then 12 hours later symbicort Plan B = Backup Only use your albuterol inhaler as a rescue medication Plan C = Crisis - only use your albuterol nebulizer if you first try Plan B and it fails to help > ok to use the nebulizer up to every 4 hours but if start needing it regularly call for immediate appointment Prednisone 10 mg take  4 each am x 2 days,   2 each am x 2 days,  1 each am x 2 days and stop    Date of Admission: 01/11/2019    Date of Discharge: 01/12/2019    Discharge Diagnosis: 1.  Acute exacerbation of COPD    TAKE these medications   albuterol (2.5 MG/3ML) 0.083% nebulizer solution Commonly known as:  PROVENTIL USE 3 ML IN NEBULIZER  EVERY 6 HOURS AS NEEDED FOR WHEEZING OR SHORTNESS OF BREATH What changed:  See the new instructions.   Ventolin HFA 108 (90 Base) MCG/ACT inhaler Generic drug:  albuterol INHALE 2 PUFFS BY MOUTH EVERY 6 HOURS AS NEEDED FOR WHEEZING What changed:    how much to take  how to take this  when to take this  reasons to take this  additional instructions   aspirin EC 81 MG tablet Take 81 mg by mouth daily.   diclofenac sodium 1 % Gel Commonly known as:  Voltaren Apply 2 g  topically 4 (four) times daily.  What changed:    when to take this  reasons to take this   omeprazole 20 MG capsule Commonly known as:  PRILOSEC Take 1 capsule (20 mg total) by mouth 2 (two) times daily before a meal.   OXYGEN Place 2-3 L into the nose 2 (two) times a day. 2lpm with rest and 3 with exertion   predniSONE 20 MG tablet Commonly known as:  DELTASONE Take 2 tablets (40 mg total) by mouth daily with breakfast. Start taking on:  January 13, 2019   pregabalin 75 MG capsule Commonly known as:  Lyrica Take 1 capsule (75 mg total) by mouth 2 (two) times daily.   Restasis 0.05 % ophthalmic emulsion Generic drug:  cycloSPORINE Place 2 drops into both eyes daily.   Symbicort 160-4.5 MCG/ACT inhaler Generic drug:  budesonide-formoterol Inhale 2 puffs by mouth twice daily   Tiotropium Bromide Monohydrate 2.5 MCG/ACT Aers Commonly known as:  Spiriva Respimat Inhale 2 puffs into the lungs daily.   valACYclovir 1000 MG tablet Commonly known as:  VALTREX TAKE ONE TABLET BY MOUTH TWICE DAILY FOR 3 DAYS AS NEEDED What changed:    how much to take  how to take this  when to take this  reasons to take this  additional instructions       Disposition and follow-up:   [name] was discharged from [facility] in [place] condition.  At the hospital follow up visit please address:  1.  Acute exacerbation of COPD: Ensure compliance with nebulizer and inhaler.  Abstain from secondhand smoke.  2.  Labs / imaging needed at time of follow-up: None  3.  Pending labs/ test needing follow-up: None  Follow-up Appointments:  Hospital Course by problem list: 1. Acute exacerbation of COPDGOLD IV: Ms. Rzasa is a 63 year old African-American woman with oxygen dependent COPDGOLD IV on 2L [place],GERD, cervical degenerative joint disease who presented to [facility] emergency department on November 20, 2018 with a 1 day history of worsening  dyspnea on exertion however she denied cough, sputum production, fevers, chills, sick contact, URI symptoms, chest pain, lightheadedness,calf pain.  Her acute onset dyspnea did not appear to be secondary to pneumonia, pulmonary embolism, acute coronary syndrome or COVID-19.  She did report of exposure to secondhand smoking.  On route to the hospital she received intramuscular epinephrine, albuterol, DuoNebs, Solu-Medrol, magnesium sulfate complete resolution of symptoms.  During her hospitalization she maintain excellent oxygen saturation on her home supplemental oxygen.  She was continued on inhalers and discharged on a 5-day course of prednisone.             01/22/2019  f/u ov/Lakshmi Sundeen re: GOLD IV copd/ 02 dep/ s/p admit p 5 days of increased ventolin need and did not call for appt as rec prior to 911 and says "wasn't able to get to nebulizer in time" which denies using at all  in the 5 days leading up to admit and was exposed to daughter's cig smoke may have tipped her over.  Chief Complaint  Patient presents with  . Acute Visit    Pt c/o chest tightness since d/c from hospital 01/12/2019.   breathing was improved when arrived home as was the chest tightness but worsened p completed p finished prednisone course which was much  shorter than usual  Dyspnea:  Able to get room to room on 2lpm does not titrate  Cough: none Sleeping: able to lie flat bed on one pillow ok  SABA use: not understanding  how/ when to use various inhalers  = proair, dulera/combivent/ventolin 02: 2lpm hs    No obvious day to day or daytime variability or assoc excess/ purulent sputum or mucus plugs or hemoptysis or cp or chest tightness, subjective wheeze or overt sinus or hb symptoms.   Sleeping  without nocturnal  or early am exacerbation  of respiratory  c/o's or need for noct saba. Also denies any obvious fluctuation of symptoms with weather or environmental changes or other aggravating or alleviating factors except as  outlined above   No unusual exposure hx or h/o childhood pna/ asthma or knowledge of premature birth.  Current Allergies, Complete Past Medical History, Past Surgical History, Family History, and Social History were reviewed in [employer] record.  ROS  The following are not active complaints unless bolded Hoarseness, sore throat, dysphagia, dental problems, itching, sneezing,  nasal congestion or discharge of excess mucus or purulent secretions, ear ache,   fever, chills, sweats, unintended wt loss or wt gain, classically pleuritic or exertional cp,  orthopnea pnd or arm/hand swelling  or leg swelling, presyncope, palpitations, abdominal pain, anorexia, nausea, vomiting, diarrhea  or change in bowel habits or change in bladder habits, change in stools or change in urine, dysuria, hematuria,  rash, arthralgias, visual complaints, headache, numbness, weakness or ataxia or problems with walking or coordination,  change in mood or  memory.        Current Meds  Medication Sig  . albuterol (PROVENTIL) (2.5 MG/3ML) 0.083% nebulizer solution USE 3 ML IN NEBULIZER  EVERY 6 HOURS AS NEEDED FOR WHEEZING OR SHORTNESS OF BREATH (Patient taking differently: Take 2.5 mg by nebulization every 6 (six) hours as needed for wheezing or shortness of breath. )  . aspirin EC 81 MG tablet Take 81 mg by mouth daily.  Marland Kitchen ipratropium (ATROVENT HFA) 17 MCG/ACT inhaler Inhale 2 puffs into the lungs every 6 (six) hours.  . mometasone-formoterol (DULERA) 200-5 MCG/ACT AERO Inhale 2 puffs into the lungs 2 (two) times daily.  Marland Kitchen omeprazole (PRILOSEC) 20 MG capsule Take 1 capsule (20 mg total) by mouth 2 (two) times daily before a meal.  . OXYGEN Place 2-3 L into the nose 2 (two) times a day. 2lpm with rest and 3 with exertion   . SYMBICORT 160-4.5 MCG/ACT inhaler Inhale 2 puffs by mouth twice daily  . Tiotropium Bromide Monohydrate (SPIRIVA RESPIMAT) 2.5 MCG/ACT AERS Inhale 2 puffs into the lungs daily.  .  VENTOLIN HFA 108 (90 Base) MCG/ACT inhaler INHALE 2 PUFFS BY MOUTH EVERY 6 HOURS AS NEEDED FOR WHEEZING (Patient taking differently: Inhale 2 puffs into the lungs every 6 (six) hours as needed for wheezing. )                  Objective:   Physical Exam     01/22/2019      94 10/21/2018    99  Wt 120 02/13/2012  > 119 03/13/2012 > 04/22/2012 117 > 07/21/2012 122> 12/02/2012  131> 03/22/2013  131 > 04/19/2013  129  > 07/19/2013 131 > 06/06/2014 129 >  11/11/2014  132> 02/28/2015   129 > 01/16/2016  115 >  02/29/2016  110 > 05/31/2016  115  > 11/21/2016   117 > 02/21/2017   107 > 06/16/2017 102 > 08/25/2017   106 > 11/26/2017 99 > 12/18/2017 >  01/15/2018  98 > 07/14/2018 104     W/c bound more chronic than acutely ill nad on 3lpm  With sats  96% and bp baseline low    HEENT: nl dentition / oropharynx. Nl external ear canals without cough reflex -  Mild bilateral non-specific turbinate edema     NECK :  without JVD/Nodes/TM/ nl carotid upstrokes bilaterally   LUNGS: no acc muscle use,  Mod barrel  contour chest wall with bilateral  Distant bs s audible wheeze and  without cough on insp or exp maneuver and mod  Hyperresonant  to  percussion bilaterally     CV:  RRR  no s3 or murmur or increase in P2, and no edema   ABD:  soft and nontender with pos mid insp Hoover's  in the supine position. No bruits or organomegaly appreciated, bowel sounds nl  MS:     ext warm without deformities, calf tenderness, cyanosis or clubbing No obvious joint restrictions   SKIN: warm and dry without lesions    NEURO:  alert, approp, nl sensorium with  no motor or cerebellar deficits apparent.        I personally reviewed images and agree with radiology impression as follows:  pCXR:   01/11/19 Hyperinflated lungs.  No acute findings.    Labs  reviewed:      Chemistry      Component Value Date/Time   NA 142 01/11/2019 0959   NA 143 08/04/2017 0845   K 4.1 01/11/2019 0959   CL 105 01/11/2019 0959   CO2 25  01/11/2019 0959   BUN 14 01/11/2019 0959   BUN 12 08/04/2017 0845   CREATININE 1.00 01/11/2019 0959   CREATININE 1.00 09/12/2015 1723      Component Value Date/Time   CALCIUM 9.0 01/11/2019 0959   ALKPHOS 52 01/11/2019 0959   AST 26 01/11/2019 0959   ALT 17 01/11/2019 0959   BILITOT 0.6 01/11/2019 0959        Lab Results  Component Value Date   WBC 10.0 01/11/2019   HGB 12.4 01/11/2019   HCT 40.1 01/11/2019   MCV 85.9 01/11/2019   PLT 273 01/11/2019       EOS                                                               0.1                                    01/22/2019        Lab Results  Component Value Date   PROBNP 34.6 01/11/19           Assessment & Plan:

## 2019-01-23 ENCOUNTER — Encounter: Payer: Self-pay | Admitting: Internal Medicine

## 2019-01-23 NOTE — Assessment & Plan Note (Signed)
As of 01/22/2019  rx =  2lpm hs and up to 3lpm with ambulation     I had an extended discussion with the patient reviewing all relevant studies completed to date and  lasting 25 minutes of a 40  minute post hosp/ transition of care office visit addressing recurrent and  severe non-specific but potentially very serious refractory respiratory symptoms of uncertain and potentially multiple  Etiologies.  See device teaching which extended face to face time for this visit   Each maintenance medication was reviewed in detail including most importantly the difference between maintenance and prns and under what circumstances the prns are to be triggered using an action plan format that is not reflected in the computer generated alphabetically organized AVS.    Please see AVS for specific instructions unique to this office visit that I personally wrote and verbalized to the the pt in detail and then reviewed with pt  by my nurse highlighting any changes in therapy/plan of care  recommended at today's visit.

## 2019-01-28 ENCOUNTER — Other Ambulatory Visit: Payer: Self-pay | Admitting: Internal Medicine

## 2019-01-28 DIAGNOSIS — J449 Chronic obstructive pulmonary disease, unspecified: Secondary | ICD-10-CM

## 2019-02-04 ENCOUNTER — Telehealth: Payer: Self-pay | Admitting: Internal Medicine

## 2019-02-04 NOTE — Telephone Encounter (Signed)
Handicap placard signed by Dr Melvyn Novas and placed in out going mail per patient request.  Nothing further at this time.

## 2019-02-04 NOTE — Telephone Encounter (Signed)
Called and spoke with Patient.  Patient requested a new handicap placard. Patient stated her current placard expires 02/2019. Patient is seen by Dr Melvyn Novas. Last OV was 01/22/19 for COPD. Next OV with Dr Melvyn Novas, 04/21/19.  Message routed to Dr Melvyn Novas to advise on handicap placard  01/22/19 instructions- Instructions   Plan A = Automatic = symbicort 160 Take 2 puffs first thing in am and then another 2 puffs about 12 hours later and spiriva 2 pffs each am   Work on inhaler technique:  relax and gently blow all the way out then take a nice smooth deep breath back in, triggering the inhaler at same time you start breathing in.  Hold for up to 5 seconds if you can. Blow out thru nose. Rinse and gargle with water when done      Plan B = Backup Only use your albuterol (proair or ventolin) inhaler as a rescue medication to be used if you can't catch your breath by resting or doing a relaxed purse lip breathing pattern.  - The less you use it, the better it will work when you need it. - Ok to use the inhaler up to 2 puffs  every 4 hours if you must but call for appointment if use goes up over your usual need - Don't leave home without it !!  (think of it like the spare tire for your car)   Plan C = Crisis - only use your albuterol nebulizer if you first try Plan B and it fails to help > ok to use the nebulizer up to every 4 hours but if start needing it regularly call for immediate appointment   Prednisone 10 mg Take 4 for three days 3 for three days 2 for three days 1 for three days and stop  Omeprazole 20 mg Take 30- 60 min before your first and last meals of the day     Keep the appt you already have

## 2019-02-04 NOTE — Telephone Encounter (Signed)
Called and spoke with Patient.  Patient requested handicap placard be mailed to her home address.  Confirmed address. Handicap placard filled put to be signed by Dr Melvyn Novas.

## 2019-02-04 NOTE — Telephone Encounter (Signed)
Ok  - reason is doe x 200 ft

## 2019-02-09 ENCOUNTER — Telehealth: Payer: Self-pay | Admitting: Internal Medicine

## 2019-02-26 ENCOUNTER — Encounter: Payer: Self-pay | Admitting: *Deleted

## 2019-03-10 DIAGNOSIS — H2513 Age-related nuclear cataract, bilateral: Secondary | ICD-10-CM | POA: Diagnosis not present

## 2019-03-10 DIAGNOSIS — H16223 Keratoconjunctivitis sicca, not specified as Sjogren's, bilateral: Secondary | ICD-10-CM | POA: Diagnosis not present

## 2019-03-18 ENCOUNTER — Telehealth: Payer: Self-pay | Admitting: Internal Medicine

## 2019-03-18 NOTE — Telephone Encounter (Signed)
Return call to Herbie Baltimore from [facility] health about oxygen paperwork on this patient that was faxed to [place] on the 15th of June and has not gotten it back yet.

## 2019-03-18 NOTE — Telephone Encounter (Signed)
Will drop it off tomorrow.

## 2019-03-18 NOTE — Telephone Encounter (Signed)
Mary Jenkins from [place] ext [phone] is following on patient oxygen

## 2019-03-25 ENCOUNTER — Encounter: Payer: Self-pay | Admitting: *Deleted

## 2019-03-25 NOTE — Telephone Encounter (Signed)
CMN for home oxygen was faxed to [facility] by [employer] on 03/19/2019. Hubbard Hartshorn, RN, BSN

## 2019-04-11 ENCOUNTER — Other Ambulatory Visit: Payer: Self-pay | Admitting: Internal Medicine

## 2019-04-11 DIAGNOSIS — J449 Chronic obstructive pulmonary disease, unspecified: Secondary | ICD-10-CM

## 2019-04-12 ENCOUNTER — Other Ambulatory Visit: Payer: Self-pay | Admitting: Internal Medicine

## 2019-04-12 ENCOUNTER — Telehealth: Payer: Self-pay | Admitting: *Deleted

## 2019-04-12 DIAGNOSIS — Z1231 Encounter for screening mammogram for malignant neoplasm of breast: Secondary | ICD-10-CM

## 2019-04-12 NOTE — Telephone Encounter (Signed)
[facility] calls for attending name for mammogram, gave dr butcher's name

## 2019-04-21 ENCOUNTER — Encounter: Payer: Self-pay | Admitting: Internal Medicine

## 2019-04-21 ENCOUNTER — Other Ambulatory Visit: Payer: Self-pay

## 2019-04-21 ENCOUNTER — Ambulatory Visit (INDEPENDENT_AMBULATORY_CARE_PROVIDER_SITE_OTHER): Payer: Medicare Other | Admitting: Internal Medicine

## 2019-04-21 DIAGNOSIS — J9611 Chronic respiratory failure with hypoxia: Secondary | ICD-10-CM

## 2019-04-21 DIAGNOSIS — J449 Chronic obstructive pulmonary disease, unspecified: Secondary | ICD-10-CM

## 2019-04-21 NOTE — Assessment & Plan Note (Signed)
sats 94% on RA 04/21/2019  @ rest so  rec continue 3lpm  hs and 3lpm poc with activity and none needed at rest    I had an extended discussion with the patient reviewing all relevant studies completed to date and  lasting 15 to 20 minutes of a 25 minute visit    I performed detailed device teaching using a teach back method which extended face to face time for this visit (see above)  Each maintenance medication was reviewed in detail including emphasizing most importantly the difference between maintenance and prns and under what circumstances the prns are to be triggered using an action plan format that is not reflected in the computer generated alphabetically organized AVS which I have not found useful in most complex patients, especially with respiratory illnesses  Please see AVS for specific instructions unique to this visit that I personally wrote and verbalized to the the pt in detail and then reviewed with pt  by my nurse highlighting any  changes in therapy recommended at today's visit to their plan of care.

## 2019-04-21 NOTE — Progress Notes (Signed)
Subjective:    Patient ID: Mary Jenkins, female    DOB: Sep 21, 1956  MRN: [id number]    Brief patient profile:   5  yobf quit smoking  07/24/11 not taking breathing medications at that point and subsequently placed on multiple meds and still sob just getting dressed so referred from the medicine clinic at [facility] 02/13/2012 for pulmonary eval with documented GOLD IV COPD 02/2013    History of Present Illness  63/23/2013 1st pulmonary cc progressive worse x 6 months doe x 50-100 ft can't do a grocery store where could do before quit. No real variability, cough is better with no excess or purulent sputum.  Not using saba daytime because finds if she's holding still doesn't need it as oftern. Some better p last  Prednisone rx. Not much variability. rec Stop atrovent and advair Start symbiocort Take 2 puffs first thing in am and then another 2 puffs about 12 hours later.  Take after only am dose Spiriva Protonix 40 mg Take 30-60 min before first meal of the day  GERD diet Only use your albuterol (Plan B= ventolin puffer,  Plan C is nebulizer) as a rescue medication to be used if you can't catch your breath by resting or doing a relaxed purse lip breathing pattern. The less you use it, the better it will work when you need it. Ok to use rescue up to every 4 hours if doing poorly.       63/28/2019  f/u ov/Yoshino Broccoli re:  COPD IV/ symb/ spiriva tapering pred  Chief Complaint  Patient presents with  . Follow-up    SOB with activity, productive cough -yellow thick mucus, slight wheezing at night more   Dyspnea:  MMRC3 = can't walk 100 yards even at a slow pace at a flat grade s stopping due to sob  Cough: congested cough  Sleep: ok flat most noct SABA use:  Not using  In any form  Rec Work on inhaler technique:  Finish prednisone as you  plan  For cough > mucinex dm up to 1200 mg daily and use flutter valve as much as possible  Whenever cough flares should be on priloec 20 mg Take 30- 60 min before  your first and last meals of the day then ok when cough better for a week to leave the prilosec back off  Please schedule a follow up office visit in 4 weeks, sooner if needed  with all medications /inhalers/ solutions in hand so we can verify exactly what you are taking. This includes all medications from all doctors and over the counters     63/25/2019  f/u ov/Artez Regis re: copd iv/ did not bring meds  Chief Complaint  Patient presents with  . Follow-up    Reports increased SOB since weather has changed. Reports her daughter used some kind of incense mist and her breathing has been worse every since. No improvement noted with neb or inhaler.   Dyspnea:  Still MMRC3 = can't walk 100 yards even at a slow pace at a flat grade s stopping due to sob   Cough: worse with certain foods / exp to incense / non prod Sleep:   No 02 / flat ok  SABA use:  Last used 2 weeks prior to OV   Feels much better on prednisone  And worse off it  Taking ppi hs       63/22/2019  f/u ov/Drezden Seitzinger re:  Copd GOLD IV/ 02 dep / ? slt flare p  uri exp  Chief Complaint  Patient presents with  . Follow-up    Increased SOB recently- relates to her grandson having a cold. She has some chest tightness. She is using her albuterol inhaler 2 x daily and rarely use her neb.    Dyspnea:  Foodlion on 3lpm poc but does not check sats Cough: min cough / nothing purulent Sleeping: flat / no pillows SABA use: as above/ chest tightness better p saba though hfa ti too short - see a/p  02: 2lpm at hs / 3lpm poc  / going back to school and present POC too noisy so turns it off then rec Spiriva should be 4 puffs of the blue or 2 pffs of the green  Tipped  spiriva each am Prednisone 10 mg take  4 each am x 2 days,   2 each am x 2 days,  1 each am x 2 days and stop   Continue omeprazole 20 mg Take 30-60 min before first meal of the day but add another dose 30 min supper for respiratory flares Plan B = Backup Only use your albuterol (ventolin)  as a rescue medication  Plan C = Crisis - only use your albuterol nebulizer if you first try Plan B and it fails to help > ok to use the nebulizer up to every 4 hours but if start needing it regularly call for immediate appointment   63/29/2020  f/u ov/Denora Wysocki re:  Copd GOLD IV /  Chief Complaint  Patient presents with  . Follow-up    Pt c/o increased cough and SOB over the past month.  She is using the ventolin inhaler multiple times per day. She rarely uses neb.    Dyspnea:  Last food lion x few months Cough: more congestion/ esp in am and hs  Sleeping: prop up 30 degrees vs before  SABA use: as above, much more since ran out of spiriva  02: 2lpm hs,  None sitting,  2-3 poc  rec Plan A = Automatic = symbicort / spiriva 2 pffs of each first thing in am then 12 hours later symbicort Plan B = Backup Only use your albuterol inhaler as a rescue medication Plan C = Crisis - only use your albuterol nebulizer if you first try Plan B and it fails to help > ok to use the nebulizer up to every 4 hours but if start needing it regularly call for immediate appointment Prednisone 10 mg take  4 each am x 2 days,   2 each am x 2 days,  1 each am x 2 days and stop    Date of Admission: 01/11/2019    Date of Discharge: 01/12/2019    Discharge Diagnosis: 1.  Acute exacerbation of COPD    TAKE these medications   albuterol (2.5 MG/3ML) 0.083% nebulizer solution Commonly known as:  PROVENTIL USE 3 ML IN NEBULIZER  EVERY 6 HOURS AS NEEDED FOR WHEEZING OR SHORTNESS OF BREATH What changed:  See the new instructions.   Ventolin HFA 108 (90 Base) MCG/ACT inhaler Generic drug:  albuterol INHALE 2 PUFFS BY MOUTH EVERY 6 HOURS AS NEEDED FOR WHEEZING What changed:    how much to take  how to take this  when to take this  reasons to take this  additional instructions   aspirin EC 81 MG tablet Take 81 mg by mouth daily.   diclofenac sodium 1 % Gel Commonly known as:  Voltaren Apply 2 g  topically 4 (four) times daily.  What changed:    when to take this  reasons to take this   omeprazole 20 MG capsule Commonly known as:  PRILOSEC Take 1 capsule (20 mg total) by mouth 2 (two) times daily before a meal.   OXYGEN Place 2-3 L into the nose 2 (two) times a day. 2lpm with rest and 3 with exertion   predniSONE 20 MG tablet Commonly known as:  DELTASONE Take 2 tablets (40 mg total) by mouth daily with breakfast. Start taking on:  January 13, 2019   pregabalin 75 MG capsule Commonly known as:  Lyrica Take 1 capsule (75 mg total) by mouth 2 (two) times daily.   Restasis 0.05 % ophthalmic emulsion Generic drug:  cycloSPORINE Place 2 drops into both eyes daily.   Symbicort 160-4.5 MCG/ACT inhaler Generic drug:  budesonide-formoterol Inhale 2 puffs by mouth twice daily   Tiotropium Bromide Monohydrate 2.5 MCG/ACT Aers Commonly known as:  Spiriva Respimat Inhale 2 puffs into the lungs daily.   valACYclovir 1000 MG tablet Commonly known as:  VALTREX TAKE ONE TABLET BY MOUTH TWICE DAILY FOR 3 DAYS AS NEEDED What changed:    how much to take  how to take this  when to take this  reasons to take this  additional instructions       Disposition and follow-up:   Ms.Julyssa KINDALL SWABY was discharged from [facility] in [place] condition.  At the hospital follow up visit please address:  1.  Acute exacerbation of COPD: Ensure compliance with nebulizer and inhaler.  Abstain from secondhand smoke.  2.  Labs / imaging needed at time of follow-up: None  3.  Pending labs/ test needing follow-up: None  Follow-up Appointments:  Hospital Course by problem list: 1. Acute exacerbation of COPDGOLD IV: Ms. Brinker is a 63 year old African-American woman with oxygen dependent COPDGOLD IV on 2L [place],GERD, cervical degenerative joint disease who presented to [facility] emergency department on November 20, 2018 with a 1 day history of worsening  dyspnea on exertion however she denied cough, sputum production, fevers, chills, sick contact, URI symptoms, chest pain, lightheadedness,calf pain.  Her acute onset dyspnea did not appear to be secondary to pneumonia, pulmonary embolism, acute coronary syndrome or COVID-19.  She did report of exposure to secondhand smoking.  On route to the hospital she received intramuscular epinephrine, albuterol, DuoNebs, Solu-Medrol, magnesium sulfate complete resolution of symptoms.  During her hospitalization she maintain excellent oxygen saturation on her home supplemental oxygen.  She was continued on inhalers and discharged on a 5-day course of prednisone.             01/22/2019  f/u ov/Justus Duerr re: GOLD IV copd/ 02 dep/ s/p admit p 5 days of increased ventolin need and did not call for appt as rec prior to 911 and says "wasn't able to get to nebulizer in time" which denies using at all  in the 5 days leading up to admit and was exposed to daughter's cig smoke may have tipped her over.  Chief Complaint  Patient presents with  . Acute Visit    Pt c/o chest tightness since d/c from hospital 01/12/2019.   breathing was improved when arrived home as was the chest tightness but worsened p completed p finished prednisone course which was much  shorter than usual  Dyspnea:  Able to get room to room on 2lpm does not titrate  Cough: none Sleeping: able to lie flat bed on one pillow ok  SABA use: not understanding  how/ when to use various inhalers  = proair, dulera/combivent/ventolin 02: 2lpm hs  rec  Plan A = Automatic = symbicort 160 Take 2 puffs first thing in am and then another 2 puffs about 12 hours later and spiriva 2 pffs each am  Work on inhaler technique:    Plan B = Backup Only use your albuterol (proair or ventolin) inhaler Plan C = Crisis - only use your albuterol nebulizer if you first try Plan B and it fails to help > ok to use the nebulizer up to every 4 hours but if start needing it regularly  call for immediate appointment Prednisone 10 mg Take 4 for three days 3 for three days 2 for three days 1 for three days and stop Omeprazole 20 mg Take 30- 60 min before your first and last meals of the day     04/21/2019  f/u ov/Zackariah Vanderpol re:  [place] Chief Complaint  Patient presents with  . Follow-up    Breathing is overall doing well. She uses her albuterol inhaler about once per wk on average. She has not been using her neb.    Dyspnea:  Very sedentary  But doing FL  On 3lpm poc  = MMRC3 = can't walk 100 yards even at a slow pace at a flat grade s stopping due to sob   Cough: none  Sleeping: ok flat/ one pillow  SABA use: much need for saba  02: 3lpm cont at home and pulsed when out    No obvious day to day or daytime variability or assoc excess/ purulent sputum or mucus plugs or hemoptysis or cp or chest tightness, subjective wheeze or overt sinus or hb symptoms.   Sleeping without nocturnal  or early am exacerbation  of respiratory  c/o's or need for noct saba. Also denies any obvious fluctuation of symptoms with weather or environmental changes or other aggravating or alleviating factors except as outlined above   No unusual exposure hx or h/o childhood pna/ asthma or knowledge of premature birth.  Current Allergies, Complete Past Medical History, Past Surgical History, Family History, and Social History were reviewed in [employer] record.  ROS  The following are not active complaints unless bolded Hoarseness, sore throat, dysphagia, dental problems, itching, sneezing,  nasal congestion or discharge of excess mucus or purulent secretions, ear ache,   fever, chills, sweats, unintended wt loss or wt gain, classically pleuritic or exertional cp,  orthopnea pnd or arm/hand swelling  or leg swelling, presyncope, palpitations, abdominal pain, anorexia, nausea, vomiting, diarrhea  or change in bowel habits or change in bladder habits, change in stools or change in  urine, dysuria, hematuria,  rash, arthralgias, visual complaints, headache, numbness, weakness or ataxia or problems with walking = now using cane  or coordination,  change in mood or  memory.        Current Meds  Medication Sig  . albuterol (PROVENTIL) (2.5 MG/3ML) 0.083% nebulizer solution USE 3 ML IN NEBULIZER  EVERY 6 HOURS AS NEEDED FOR WHEEZING OR SHORTNESS OF BREATH (Patient taking differently: Take 2.5 mg by nebulization every 6 (six) hours as needed for wheezing or shortness of breath. )  . aspirin EC 81 MG tablet Take 81 mg by mouth daily.  . mometasone-formoterol (DULERA) 200-5 MCG/ACT AERO Inhale 2 puffs into the lungs 2 (two) times daily.  Marland Kitchen omeprazole (PRILOSEC) 20 MG capsule Take 1 capsule (20 mg total) by mouth 2 (two) times daily before a meal.  .  OXYGEN Place 2-3 L into the nose 2 (two) times a day. 2lpm with rest and 3 with exertion   . predniSONE (DELTASONE) 10 MG tablet Take 4 for three days 3 for three days 2 for three days 1 for three days and stop  . SYMBICORT 160-4.5 MCG/ACT inhaler Inhale 2 puffs by mouth twice daily  . Tiotropium Bromide Monohydrate (SPIRIVA RESPIMAT) 2.5 MCG/ACT AERS Inhale 2 puffs into the lungs daily.  [place] Cutter HFA 108 (90 Base) MCG/ACT inhaler INHALE 2 PUFFS BY MOUTH EVERY 6 HOURS AS NEEDED FOR WHEEZING           Objective:   Physical Exam    04/21/2019   99  01/22/2019      94 10/21/2018    99  Wt 120 02/13/2012  > 119 03/13/2012 > 04/22/2012 117 > 07/21/2012 122> 12/02/2012  131> 03/22/2013  131 > 04/19/2013  129  > 07/19/2013 131 > 06/06/2014 129 >  11/11/2014  132> 02/28/2015   129 > 01/16/2016  115 >  02/29/2016  110 > 05/31/2016  115  > 11/21/2016   117 > 02/21/2017   107 > 06/16/2017 102 > 08/25/2017   106 > 11/26/2017 99 > 12/18/2017 >  01/15/2018  98 > 07/14/2018 104      amb bf walking with cane   Vital signs reviewed - Note on arrival 02 sats  100% on 3lpm pulse       HEENT: Full dentures / nl  oropharynx. Nl external ear canals without cough reflex -   Mild bilateral non-specific turbinate edema     NECK :  without JVD/Nodes/TM/ nl carotid upstrokes bilaterally   LUNGS: no acc muscle use,  Mod barrel  contour chest wall with bilateral  Distant bs s audible wheeze and  without cough on insp or exp maneuver and mod  Hyperresonant  to  percussion bilaterally     CV:  RRR  no s3 or murmur or increase in P2, and no edema   ABD:  soft and nontender with pos mid insp Hoover's  in the supine position. No bruits or organomegaly appreciated, bowel sounds nl  MS:     ext warm without deformities, calf tenderness, cyanosis or clubbing No obvious joint restrictions   SKIN: warm and dry without lesions    NEURO:  alert, approp, nl sensorium with  no motor or cerebellar deficits apparent.           Assessment & Plan:

## 2019-04-21 NOTE — Patient Instructions (Addendum)
No change in medications or 02    Please schedule a follow up visit in 6  months but call sooner if needed

## 2019-04-21 NOTE — Assessment & Plan Note (Signed)
Quit smoking 06/2011  PFT performed on 06/27/2011. Poor quality.    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 prior to study with DLCO  24 % corrects to 33 % for alv volume    - referred to rehab again 11/26/2017 > could not afford it   - 04/21/2019  After extensive coaching inhaler device,  effectiveness =    75% (Ti too short)   Group D in terms of symptom/risk and laba/lama/ICS  therefore appropriate rx at this point >>>  Continue symb 160/spiriva smi

## 2019-05-04 ENCOUNTER — Encounter: Payer: Medicare Other | Admitting: Internal Medicine

## 2019-05-09 ENCOUNTER — Other Ambulatory Visit: Payer: Self-pay | Admitting: Internal Medicine

## 2019-05-09 DIAGNOSIS — J449 Chronic obstructive pulmonary disease, unspecified: Secondary | ICD-10-CM

## 2019-05-10 NOTE — Telephone Encounter (Signed)
Last appt  11/13/18 Next appt 05/26/19

## 2019-05-14 ENCOUNTER — Encounter: Payer: Medicare Other | Admitting: Internal Medicine

## 2019-05-18 ENCOUNTER — Encounter: Payer: Self-pay | Admitting: *Deleted

## 2019-05-18 NOTE — Progress Notes (Signed)
Has this been placed in my box?

## 2019-05-18 NOTE — Progress Notes (Unsigned)
Dear PCP Your patient is scheduled for a Medicare annual wellness visit with the RN.  Please fill out the following required information.  You do not have to address every Medicare Covered Preventative Screenings and Services listed in the chart below but selected the items most meaningful to your patient and limit the number so that it is manageable for the patient.  Please return the form within the next 7 days to [facility].    Things That May Be Affecting Your Health:  Alcohol  Hearing loss  Pain    Depression  Home Safety  Sexual Health   Diabetes  Lack of physical activity  Stress   Difficulty with daily activities  Loneliness  Tiredness   Drug use  Medicines  Tobacco use   Falls  Motor Vehicle Safety  Weight   Food choices  Oral Health  Other    YOUR PERSONALIZED HEALTH PLAN : 1. Schedule your next subsequent Medicare Wellness visit in one year 2. Attend all of your regular appointments to address your medical issues 3. Complete the preventative screenings and services   Annual Wellness Visit   Medicare Covered Preventative Screenings and [place] Men and Women Who How Often Need? Date of Last Service Action  Abdominal Aortic Aneurysm Adults with AAA risk factors Once     Alcohol Misuse and Counseling All Adults Screening once a year if no alcohol misuse. Counseling up to 4 face to face sessions.     Bone Density Measurement  Adults at risk for osteoporosis Once every 2 yrs     Lipid Panel Z13.6 All adults without CV disease Once every 5 yrs     Colorectal Cancer   Stool sample or  Colonoscopy All adults 68 and older   Once every year  Every 10 years     Depression All Adults Once a year  Today   Diabetes Screening Blood glucose, post glucose load, or GTT Z13.1  All adults at risk  Pre-diabetics  Once per year  Twice per year     Diabetes  Self-Management Training All adults Diabetics 10 hrs first year; 2 hours subsequent years. Requires  Copay     Glaucoma  Diabetics  Family history of glaucoma  African Americans 16 yrs +  Hispanic Americans 69 yrs + Annually - requires coppay     Hepatitis C Z72.89 or F19.20  High Risk for HCV  Born between 1945 and 1965  Annually  Once     HIV Z11.4 All adults based on risk  Annually btw ages 37 & 40 regardless of risk  Annually > 65 yrs if at increased risk     Lung Cancer Screening Asymptomatic adults aged 3-77 with 30 pack yr history and current smoker OR quit within the last 15 yrs Annually Must have counseling and shared decision making documentation before first screen     Medical Nutrition Therapy Adults with   Diabetes  Renal disease  Kidney transplant within past 3 yrs 3 hours first year; 2 hours subsequent years     Obesity and Counseling All adults Screening once a year Counseling if BMI 30 or higher  Today   Tobacco Use Counseling Adults who use tobacco  Up to 8 visits in one year     Vaccines Z23  Hepatitis B  Influenza   Pneumonia  Adults   Once  Once every flu season  Two different vaccines separated by one year     Next Annual Wellness  Visit People with Medicare Every year  Today     [place] Women Who How Often Need  Date of Last Service Action  Mammogram  Z12.31 Women over 29 One baseline ages 55-39. Annually ager 40 yrs+     Pap tests All women Annually if high risk. Every 2 yrs for normal risk women     Screening for cervical cancer with   Pap (Z01.419 nl or Z01.411abnl) &  HPV Z11.51 Women aged 29 to 8 Once every 5 yrs     Screening pelvic and breast exams All women Annually if high risk. Every 2 yrs for normal risk women     Sexually Transmitted Diseases  Chlamydia  Gonorrhea  Syphilis All at risk adults Annually for non pregnant females at increased risk         [place] Men Who How Ofter Need  Date of Last Service Action  Prostate Cancer - DRE & PSA Men over 50 Annually.  DRE might  require a copay.     Sexually Transmitted Diseases  Syphilis All at risk adults Annually for men at increased risk

## 2019-05-24 ENCOUNTER — Other Ambulatory Visit: Payer: Self-pay | Admitting: Obstetrics

## 2019-05-24 DIAGNOSIS — A6000 Herpesviral infection of urogenital system, unspecified: Secondary | ICD-10-CM

## 2019-05-25 ENCOUNTER — Ambulatory Visit
Admission: RE | Admit: 2019-05-25 | Discharge: 2019-05-25 | Disposition: A | Discharge status: Home / Self Care | Payer: Medicare Other | Source: Ambulatory Visit | Attending: Internal Medicine | Admitting: Internal Medicine

## 2019-05-25 ENCOUNTER — Other Ambulatory Visit: Payer: Self-pay

## 2019-05-25 DIAGNOSIS — Z1231 Encounter for screening mammogram for malignant neoplasm of breast: Secondary | ICD-10-CM

## 2019-05-28 ENCOUNTER — Other Ambulatory Visit: Payer: Self-pay

## 2019-05-28 ENCOUNTER — Telehealth: Payer: Medicare Other | Admitting: Internal Medicine

## 2019-05-28 ENCOUNTER — Encounter: Payer: Self-pay | Admitting: Internal Medicine

## 2019-05-28 ENCOUNTER — Ambulatory Visit (INDEPENDENT_AMBULATORY_CARE_PROVIDER_SITE_OTHER): Payer: Medicare Other | Admitting: Internal Medicine

## 2019-05-28 VITALS — BP 121/89 | HR 65 | Ht 65.5 in | Wt 99.4 lb

## 2019-05-28 DIAGNOSIS — Z0000 Encounter for general adult medical examination without abnormal findings: Secondary | ICD-10-CM | POA: Diagnosis not present

## 2019-05-28 NOTE — Patient Instructions (Addendum)
Annual Wellness Visit   Medicare Covered Preventative Screenings and Services  Services & Screenings Men and Women Who How Often Need? Date of Last Service Action  Abdominal Aortic Aneurysm Adults with AAA risk factors Once     Alcohol Misuse and Counseling All Adults Screening once a year if no alcohol misuse. Counseling up to 4 face to face sessions.     Bone Density Measurement  Adults at risk for osteoporosis Once every 2 yrs     Lipid Panel Z13.6 All adults without CV disease Once every 5 yrs     Colorectal Cancer   Stool sample or  Colonoscopy All adults 69 and older   Once every year  Every 10 years     Depression All Adults Once a year  Today   Diabetes Screening Blood glucose, post glucose load, or GTT Z13.1  All adults at risk  Pre-diabetics  Once per year  Twice per year     Diabetes  Self-Management Training All adults Diabetics 10 hrs first year; 2 hours subsequent years. Requires Copay     Glaucoma  Diabetics  Family history of glaucoma  African Americans 51 yrs +  Hispanic Americans 13 yrs + Annually - requires coppay     Hepatitis C Z72.89 or F19.20  High Risk for HCV  Born between 1945 and 1965  Annually  Once     HIV Z11.4 All adults based on risk  Annually btw ages 28 & 11 regardless of risk  Annually > 65 yrs if at increased risk     Lung Cancer Screening Asymptomatic adults aged 4-77 with 30 pack yr history and current smoker OR quit within the last 15 yrs Annually Must have counseling and shared decision making documentation before first screen  Yes   Discuss with Dr. Maricela Bo at next visit  Medical Nutrition Therapy Adults with   Diabetes  Renal disease  Kidney transplant within past 3 yrs 3 hours first year; 2 hours subsequent years     Obesity and Counseling All adults Screening once a year Counseling if BMI 30 or higher  Today   Tobacco Use Counseling Adults who use tobacco  Up to 8 visits in one year     Vaccines Z23   Hepatitis B  Influenza   Pneumonia  Adults   Once  Once every flu season  Two different vaccines separated by one year  Yes   Flu vaccine  Next Annual Wellness Visit People with Medicare Every year  Today     Services & Screenings Women Who How Often Need  Date of Last Service Action  Mammogram  Z12.31 Women over 29 One baseline ages 35-39. Annually ager 40 yrs+     Pap tests All women Annually if high risk. Every 2 yrs for normal risk women     Screening for cervical cancer with   Pap (Z01.419 nl or Z01.411abnl) &  HPV Z11.51 Women aged 71 to 30 Once every 5 yrs     Screening pelvic and breast exams All women Annually if high risk. Every 2 yrs for normal risk women     Sexually Transmitted Diseases  Chlamydia  Gonorrhea  Syphilis All at risk adults Annually for non pregnant females at increased risk         [place] Men Who How Ofter Need  Date of Last Service Action  Prostate Cancer - DRE & PSA Men over 50 Annually.  DRE might require a copay.  Sexually Transmitted Diseases  Syphilis All at risk adults Annually for men at increased risk         Things That May Be Affecting Your Health:  Alcohol  Hearing loss  Pain    Depression  Home Safety  Sexual Health   Diabetes  Lack of physical activity X Stress   Difficulty with daily activities  Loneliness  Tiredness   Drug use X Medicines  Tobacco use   Falls  Motor Vehicle Safety X Weight   Food choices  Oral Health  Other    YOUR PERSONALIZED HEALTH PLAN : 1. Schedule your next subsequent Medicare Wellness visit in one year 2. Attend all of your regular appointments to address your medical issues 3. Complete the preventative screenings and services 4. Receive flu vaccine at next OV with Dr. Maricela Bo on 06/15/2019 5. Congratulations on goal of waking up every morning and walking 10-15 minutes!! 6. Begin seated and standing exercises with exercise band on days when the weather won't let you  walk outside     Fall Prevention in the Home, Adult Falls can cause injuries. They can happen to people of all ages. There are many things you can do to make your home safe and to help prevent falls. Ask for help when making these changes, if needed. What actions can I take to prevent falls? General Instructions  Use good lighting in all rooms. Replace any light bulbs that burn out.  Turn on the lights when you go into a dark area. Use night-lights.  Keep items that you use often in easy-to-reach places. Lower the shelves around your home if necessary.  Set up your furniture so you have a clear path. Avoid moving your furniture around.  Do not have throw rugs and other things on the floor that can make you trip.  Avoid walking on wet floors.  If any of your floors are uneven, fix them.  Add color or contrast paint or tape to clearly mark and help you see: ? Any grab bars or handrails. ? First and last steps of stairways. ? Where the edge of each step is.  If you use a stepladder: ? Make sure that it is fully opened. Do not climb a closed stepladder. ? Make sure that both sides of the stepladder are locked into place. ? Ask someone to hold the stepladder for you while you use it.  If there are any pets around you, be aware of where they are. What can I do in the bathroom?      Keep the floor dry. Clean up any water that spills onto the floor as soon as it happens.  Remove soap buildup in the tub or shower regularly.  Use non-skid mats or decals on the floor of the tub or shower.  Attach bath mats securely with double-sided, non-slip rug tape.  If you need to sit down in the shower, use a plastic, non-slip stool.  Install grab bars by the toilet and in the tub and shower. Do not use towel bars as grab bars. What can I do in the bedroom?  Make sure that you have a light by your bed that is easy to reach.  Do not use any sheets or blankets that are too big for your  bed. They should not hang down onto the floor.  Have a firm chair that has side arms. You can use this for support while you get dressed. What can I do in the kitchen?  Clean up any spills right away.  If you need to reach something above you, use a strong step stool that has a grab bar.  Keep electrical cords out of the way.  Do not use floor polish or wax that makes floors slippery. If you must use wax, use non-skid floor wax. What can I do with my stairs?  Do not leave any items on the stairs.  Make sure that you have a light switch at the top of the stairs and the bottom of the stairs. If you do not have them, ask someone to add them for you.  Make sure that there are handrails on both sides of the stairs, and use them. Fix handrails that are broken or loose. Make sure that handrails are as long as the stairways.  Install non-slip stair treads on all stairs in your home.  Avoid having throw rugs at the top or bottom of the stairs. If you do have throw rugs, attach them to the floor with carpet tape.  Choose a carpet that does not hide the edge of the steps on the stairway.  Check any carpeting to make sure that it is firmly attached to the stairs. Fix any carpet that is loose or worn. What can I do on the outside of my home?  Use bright outdoor lighting.  Regularly fix the edges of walkways and driveways and fix any cracks.  Remove anything that might make you trip as you walk through a door, such as a raised step or threshold.  Trim any bushes or trees on the path to your home.  Regularly check to see if handrails are loose or broken. Make sure that both sides of any steps have handrails.  Install guardrails along the edges of any raised decks and porches.  Clear walking paths of anything that might make someone trip, such as tools or rocks.  Have any leaves, snow, or ice cleared regularly.  Use sand or salt on walking paths during winter.  Clean up any spills in  your garage right away. This includes grease or oil spills. What other actions can I take?  Wear shoes that: ? Have a low heel. Do not wear high heels. ? Have rubber bottoms. ? Are comfortable and fit you well. ? Are closed at the toe. Do not wear open-toe sandals.  Use tools that help you move around (mobility aids) if they are needed. These include: ? Canes. ? Walkers. ? Scooters. ? Crutches.  Review your medicines with your doctor. Some medicines can make you feel dizzy. This can increase your chance of falling. Ask your doctor what other things you can do to help prevent falls. Where to find more information  Centers for Disease Control and Prevention, STEADI: https://garcia.biz/  [employer] on Aging: BrainJudge.co.uk Contact a doctor if:  You are afraid of falling at home.  You feel weak, drowsy, or dizzy at home.  You fall at home. Summary  There are many simple things that you can do to make your home safe and to help prevent falls.  Ways to make your home safe include removing tripping hazards and installing grab bars in the bathroom.  Ask for help when making these changes in your home. This information is not intended to replace advice given to you by your health care provider. Make sure you discuss any questions you have with your health care provider. Document Released: 07/06/2009 Document Revised: 12/31/2018 Document Reviewed: 04/24/2017 Elsevier Patient Education  2020 Elsevier  [place] Maintenance, Female Adopting a healthy lifestyle and getting preventive care are important in promoting health and wellness. Ask your health care provider about:  The right schedule for you to have regular tests and exams.  Things you can do on your own to prevent diseases and keep yourself healthy. What should I know about diet, weight, and exercise? Eat a healthy diet   Eat a diet that includes plenty of vegetables, fruits, low-fat dairy products,  and lean protein.  Do not eat a lot of foods that are high in solid fats, added sugars, or sodium. Maintain a healthy weight Body mass index (BMI) is used to identify weight problems. It estimates body fat based on height and weight. Your health care provider can help determine your BMI and help you achieve or maintain a healthy weight. Get regular exercise Get regular exercise. This is one of the most important things you can do for your health. Most adults should:  Exercise for at least 150 minutes each week. The exercise should increase your heart rate and make you sweat (moderate-intensity exercise).  Do strengthening exercises at least twice a week. This is in addition to the moderate-intensity exercise.  Spend less time sitting. Even light physical activity can be beneficial. Watch cholesterol and blood lipids Have your blood tested for lipids and cholesterol at 63 years of age, then have this test every 5 years. Have your cholesterol levels checked more often if:  Your lipid or cholesterol levels are high.  You are older than 64 years of age.  You are at high risk for heart disease. What should I know about cancer screening? Depending on your health history and family history, you may need to have cancer screening at various ages. This may include screening for:  Breast cancer.  Cervical cancer.  Colorectal cancer.  Skin cancer.  Lung cancer. What should I know about heart disease, diabetes, and high blood pressure? Blood pressure and heart disease  High blood pressure causes heart disease and increases the risk of stroke. This is more likely to develop in people who have high blood pressure readings, are of African descent, or are overweight.  Have your blood pressure checked: ? Every 3-5 years if you are 57-69 years of age. ? Every year if you are 75 years old or older. Diabetes Have regular diabetes screenings. This checks your fasting blood sugar level. Have the  screening done:  Once every three years after age 54 if you are at a normal weight and have a low risk for diabetes.  More often and at a younger age if you are overweight or have a high risk for diabetes. What should I know about preventing infection? Hepatitis B If you have a higher risk for hepatitis B, you should be screened for this virus. Talk with your health care provider to find out if you are at risk for hepatitis B infection. Hepatitis C Testing is recommended for:  Everyone born from [birth year] through 1965.  Anyone with known risk factors for hepatitis C. Sexually transmitted infections (STIs)  Get screened for STIs, including gonorrhea and chlamydia, if: ? You are sexually active and are younger than 63 years of age. ? You are older than 63 years of age and your health care provider tells you that you are at risk for this type of infection. ? Your sexual activity has changed since you were last screened, and you are at increased risk for chlamydia or gonorrhea. Ask your  health care provider if you are at risk.  Ask your health care provider about whether you are at high risk for HIV. Your health care provider may recommend a prescription medicine to help prevent HIV infection. If you choose to take medicine to prevent HIV, you should first get tested for HIV. You should then be tested every 3 months for as long as you are taking the medicine. Pregnancy  If you are about to stop having your period (premenopausal) and you may become pregnant, seek counseling before you get pregnant.  Take 400 to 800 micrograms (mcg) of folic acid every day if you become pregnant.  Ask for birth control (contraception) if you want to prevent pregnancy. Osteoporosis and menopause Osteoporosis is a disease in which the bones lose minerals and strength with aging. This can result in bone fractures. If you are 76 years old or older, or if you are at risk for osteoporosis and fractures, ask your health  care provider if you should:  Be screened for bone loss.  Take a calcium or vitamin D supplement to lower your risk of fractures.  Be given hormone replacement therapy (HRT) to treat symptoms of menopause. Follow these instructions at home: Lifestyle  Do not use any products that contain nicotine or tobacco, such as cigarettes, e-cigarettes, and chewing tobacco. If you need help quitting, ask your health care provider.  Do not use street drugs.  Do not share needles.  Ask your health care provider for help if you need support or information about quitting drugs. Alcohol use  Do not drink alcohol if: ? Your health care provider tells you not to drink. ? You are pregnant, may be pregnant, or are planning to become pregnant.  If you drink alcohol: ? Limit how much you use to 0-1 drink a day. ? Limit intake if you are breastfeeding.  Be aware of how much alcohol is in your drink. In the [place] one drink equals one 12 oz bottle of beer (355 mL), one 5 oz glass of wine (148 mL), or one 1 oz glass of hard liquor (44 mL). General instructions  Schedule regular health, dental, and eye exams.  Stay current with your vaccines.  Tell your health care provider if: ? You often feel depressed. ? You have ever been abused or do not feel safe at home. Summary  Adopting a healthy lifestyle and getting preventive care are important in promoting health and wellness.  Follow your health care provider's instructions about healthy diet, exercising, and getting tested or screened for diseases.  Follow your health care provider's instructions on monitoring your cholesterol and blood pressure. This information is not intended to replace advice given to you by your health care provider. Make sure you discuss any questions you have with your health care provider. Document Released: 03/25/2011 Document Revised: 09/02/2018 Document Reviewed: 09/02/2018 Elsevier Patient Education  2020 [employer].

## 2019-05-28 NOTE — Progress Notes (Signed)
This AWV is being conducted by [place] only. The patient was located at home and I was located in [facility]. The patient's identity was confirmed using their DOB and current address. The patient or his/her legal guardian has consented to being evaluated through a telephone encounter and understands the associated risks (an examination cannot be done and the patient may need to come in for an appointment) / benefits (allows the patient to remain at home, decreasing exposure to coronavirus). I personally spent 38 minutes conducting the AWV.  Subjective:   Mary Jenkins is a 63 y.o. female who presents for a Medicare Annual Wellness Visit.  The following items have been reviewed and updated today in the appropriate area in the EMR.   Health Risk Assessment  Height, weight, BMI, and BP Visual acuity if needed Depression screen Fall risk / safety level Advance directive discussion Medical and family history were reviewed and updated Updating list of other providers & suppliers Medication reconciliation, including over the counter medicines Cognitive screen Written screening schedule Risk Factor list Personalized health advice, risky behaviors, and treatment advice  Social History   Social History Narrative   Current Social History 05/28/2019        Patient lives alone in a ground floor apartment which is 1 story. There are not steps up to the entrance the patient uses.       Patient's method of transportation is personal car.      The highest level of education was 11 th grade; currently working on getting GED!      The patient currently disabled.      Identified important Relationships are "My kids, my sister, and brothers."       Pets : None       Interests / Fun: "Go to my son's house and keep my 29 yo grandson."       Current Stressors: "People stress me out."       Religious / Personal Beliefs: "I serve an awesome God and believe in the power of prayer."       L. Emlyn Maves, RN, BSN             Objective:    Vitals: BP 121/89 (BP Location: Left Arm, Patient Position: Sitting, Cuff Size: Normal)   Pulse 65   Ht 5' 5.5" (1.664 m)   Wt 99 lb 6.4 oz (45.1 kg)   BMI 16.29 kg/m  Vitals are patient reported  Activities of Daily Living In your present state of health, do you have any difficulty performing the following activities: 05/28/2019 01/11/2019  Hearing? N N  Vision? N Y  Comment - when reading  Difficulty concentrating or making decisions? N N  Walking or climbing stairs? Y Y  Dressing or bathing? Y N  Doing errands, shopping? Y N  Some recent data might be hidden    Goals Goals    . Get up and walk every morning for 10-15 minutes (pt-stated)     Will walk with portable oxygen and cane       Fall Risk Fall Risk  05/28/2019 11/13/2018 05/22/2018 11/17/2017 04/25/2017  Falls in the past year? 0 0 No No No  Number falls in past yr: - - - - -  Injury with Fall? - - - - -  Comment - - - - -  Risk for fall due to : - - - - -  Risk for fall due to: Comment - - - - -  Follow up Education provided;Falls prevention discussed Falls prevention discussed - - -  CDC Handout on Fall Prevention and Handout on Home Exercise Program, Access codes [email or web address] and [email or web address] mailed to patient with exercise band.    Depression Screen PHQ 2/9 Scores 05/28/2019 11/13/2018 05/22/2018 11/17/2017  PHQ - 2 Score 1 1 0 0  PHQ- 9 Score 4 - - -     Cognitive Testing Six-Item Cognitive Screener   "I would like to ask you some questions that ask you to use your memory. I am going to name three objects. Please wait until I say all three words, then repeat them. Remember what they are  because I am going to ask you to name them again in a few minutes. Please repeat these words for me: APPLE-TABLE-PENNY." (Interviewer may repeat names 3 times if necessary but repetition not scored.)  Did patient correctly repeat all three words? Yes - may proceed with screen  What  year is this? Correct What month is this? Correct What day of the week is this? Correct  What were the three objects I asked you to remember? . Apple Correct . Table Correct . Penny Correct  Score one point for each incorrect answer.  A score of 2 or more points warrants additional investigation.  Patient's score 0    Assessment and Plan:     Patient will receive flu vaccine at next OV with Dr. Maricela Bo on 06/15/2019 She will discuss lung cancer screening with Dr. Maricela Bo on 06/15/2019 Patient has set a goal of waking up every morning and walking 10-15 minutes!! She will begin seated and standing exercises with exercise band on days when the weather won't permit her to walk outside  During the course of the visit the patient was educated and counseled about appropriate screening and preventive services as documented in the assessment and plan.  The printed AVS was given to the patient and included an updated screening schedule, a list of risk factors, and personalized health advice.        Velora Heckler, RN  05/28/2019

## 2019-05-28 NOTE — Progress Notes (Signed)
I discussed the AWV findings with the RN who conducted the visit. I was present in the office suite and immediately available to provide assistance and direction throughout the time the service was provided.  Lars Mage, MD Internal Medicine PGY3 IJLTH:[phone] 05/28/2019, 10:44 AM

## 2019-06-01 NOTE — Progress Notes (Signed)
Internal Medicine Clinic Attending  I reviewed the AWV findings.  I agree with the assessment, diagnosis, and plan of care documented in the AWV note.

## 2019-06-07 ENCOUNTER — Telehealth: Payer: Self-pay | Admitting: Internal Medicine

## 2019-06-07 NOTE — Telephone Encounter (Signed)
Spoke with pt regarding refill for Omeprazole.  She wants to make sure MW wants her to stay on this before refilling.  Pt states that she is feeling overall well.  She states that she does still have some phlegm that she has to cough up after eating and drinking but once she coughs this up then she is fine. Please advised regarding Omeprazole refill.

## 2019-06-07 NOTE — Telephone Encounter (Signed)
Called and spoke with pt regarding MW's recommendations. Pt verbalized understanding and states she will try off the omeprazole and give our office a call if her cough returns and/or if/when she needs a refill of omeprazole. Nothing further needed at this time.

## 2019-06-07 NOTE — Telephone Encounter (Signed)
If doing better to her satisfaction ok to try off and see which if any of her symptoms flare off it

## 2019-06-13 NOTE — Progress Notes (Signed)
CC: copd follow up  HPI:  Mary Jenkins is a 63 y.o. female with copd, cervical spine arthritis, hx of breast cancer who presents for follow up. Please see problem based charting for evaluation, assessment, and plan.  Past Medical History:  Diagnosis Date  . Breast cancer ([place])   . Cataract, immature    bilateral  . COPD (chronic obstructive pulmonary disease) ([place])    no home O2  . Cough 02/09/2015  . DDD (degenerative disc disease), cervical   . Exertional shortness of breath    states if she "takes her time" doing activities does not get SOB  . Family history of adverse reaction to anesthesia    pt's sister has hx. of post-op N/V  . Full dentures   . GERD (gastroesophageal reflux disease) 02/03/2012  . High cholesterol    no current med.  Marland Kitchen History of breast cancer   . Personal history of chemotherapy 1997  . Runny nose 02/09/2015   clear drainage, per pt.   Review of Systems:    Review of Systems  Constitutional: Negative for chills and fever.  Respiratory: Negative for cough and shortness of breath.   Gastrointestinal: Negative for abdominal pain and vomiting.  Neurological: Negative for dizziness and headaches.   Physical Exam:  Vitals:   06/15/19 1431  BP: 118/77  Pulse: 80  Temp: 98.2 F (36.8 C)  TempSrc: Oral  SpO2: 100%  Weight: 97 lb 1.6 oz (44 kg)  Height: 5' 5.5" (1.664 m)   Physical Exam  Constitutional: Appears Cachectic. No distress.  HENT:  Head: Normocephalic and atraumatic.  Eyes: Conjunctivae are normal.  Cardiovascular: Normal rate, regular rhythm and normal heart sounds.  Respiratory: Effort normal and breath sounds normal. No respiratory distress. No wheezes.  GI: Soft. Bowel sounds are normal. No distension. There is no tenderness.  Musculoskeletal: No edema.  Neurological: Is alert.  Skin: Not diaphoretic. No erythema.  Psychiatric: Normal mood and affect. Behavior is normal. Judgment and thought content normal.     Assessment & Plan:   See Encounters Tab for problem based charting.  Patient discussed with Dr. Lynnae January

## 2019-06-14 ENCOUNTER — Other Ambulatory Visit: Payer: Self-pay | Admitting: Internal Medicine

## 2019-06-14 DIAGNOSIS — J449 Chronic obstructive pulmonary disease, unspecified: Secondary | ICD-10-CM

## 2019-06-14 DIAGNOSIS — J441 Chronic obstructive pulmonary disease with (acute) exacerbation: Secondary | ICD-10-CM

## 2019-06-15 ENCOUNTER — Ambulatory Visit (INDEPENDENT_AMBULATORY_CARE_PROVIDER_SITE_OTHER): Payer: Medicare Other | Admitting: Internal Medicine

## 2019-06-15 ENCOUNTER — Encounter: Payer: Self-pay | Admitting: Internal Medicine

## 2019-06-15 ENCOUNTER — Other Ambulatory Visit: Payer: Self-pay

## 2019-06-15 VITALS — BP 118/77 | HR 80 | Temp 98.2°F | Ht 65.5 in | Wt 97.1 lb

## 2019-06-15 DIAGNOSIS — Z9981 Dependence on supplemental oxygen: Secondary | ICD-10-CM

## 2019-06-15 DIAGNOSIS — Z23 Encounter for immunization: Secondary | ICD-10-CM

## 2019-06-15 DIAGNOSIS — Z681 Body mass index (BMI) 19 or less, adult: Secondary | ICD-10-CM | POA: Diagnosis not present

## 2019-06-15 DIAGNOSIS — J449 Chronic obstructive pulmonary disease, unspecified: Secondary | ICD-10-CM

## 2019-06-15 DIAGNOSIS — R634 Abnormal weight loss: Secondary | ICD-10-CM

## 2019-06-15 DIAGNOSIS — Z7951 Long term (current) use of inhaled steroids: Secondary | ICD-10-CM

## 2019-06-15 DIAGNOSIS — M47812 Spondylosis without myelopathy or radiculopathy, cervical region: Secondary | ICD-10-CM

## 2019-06-15 DIAGNOSIS — Z853 Personal history of malignant neoplasm of breast: Secondary | ICD-10-CM

## 2019-06-15 DIAGNOSIS — Z0000 Encounter for general adult medical examination without abnormal findings: Secondary | ICD-10-CM

## 2019-06-15 DIAGNOSIS — Z79899 Other long term (current) drug therapy: Secondary | ICD-10-CM

## 2019-06-15 NOTE — Assessment & Plan Note (Signed)
Patient had significant unintentional weight loss between 2016-2018 that was worked up for thyroid, hiv, recurrent malignancy, metastatic disease all of which were unremarkable. 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 Will arrange for nutrition referral to help assist with this.

## 2019-06-15 NOTE — Assessment & Plan Note (Signed)
  The patient saw Dr. Melvyn Novas in July 2020 after her hospitalization for copd exacerbation. She has been taking symbicort, spiriva, and on supplemental oxygen 2-3L. She uses ventolin and albuterol on an as needed basis.   She cnnot walk 35yrs without stopping due to dyspnea.   Assessment and plan  Will arrange appointment for patient to speak about goals of care going forward. Continue current treatment plan.   The patient had a mammogram done in September 2020 which did not show any malignancy.

## 2019-06-15 NOTE — Patient Instructions (Signed)
It was a pleasure to see you today Mary Jenkins. Please make the following changes:  You are doing great! Continue to take care of yourself and be safe during the covid19 pandemic. You got your flu shot today. Please schedule a visit at your convenience with your daughters so we can talk about your goals of care.   If you have any questions or concerns, please call our clinic at [phone] between 9am-5pm and after hours call [phone] and ask for the internal medicine resident on call. If you feel you are having a medical emergency please call 911.   Thank you, we look forward to help you remain healthy!  Lars Mage, MD Internal Medicine PGY3

## 2019-06-15 NOTE — Assessment & Plan Note (Signed)
Patient received influenza vaccination today

## 2019-06-16 NOTE — Progress Notes (Signed)
Internal Medicine Clinic Attending  Case discussed with Dr. Maricela Bo at the time of the visit.  We reviewed the resident's history and exam and pertinent patient test results.  I agree with the assessment, diagnosis, and plan of care documented in the resident's note.  COPD will likely be her life limiting condition so therefore, I agree with a goals of care discussion either by the PCP IN a continuity clinic appointment or [facility] or with a nurse [facility].

## 2019-06-21 ENCOUNTER — Encounter: Payer: Medicare Other | Admitting: Dietician

## 2019-06-23 ENCOUNTER — Encounter: Payer: Self-pay | Admitting: Dietician

## 2019-06-23 ENCOUNTER — Other Ambulatory Visit: Payer: Self-pay

## 2019-06-23 ENCOUNTER — Ambulatory Visit: Payer: Medicare Other | Admitting: Dietician

## 2019-06-23 NOTE — Patient Instructions (Addendum)
To help me gain weight and be better nourished..  Over the next few week I will look at calories in my TV dinner,  Add a green vegetable to the TV dinner  Think about how I can add calories to it or consider making my own  Please make an appointment for follow up in 4 weeks.   Mary Jenkins [phone]

## 2019-06-23 NOTE — Progress Notes (Signed)
  Documentation:  Appt start time: 1530 end time:  1630. Total time: 60 Visit # 1  Assessment:  Primary concerns today: she wants to gain weight Ms. Youtz lives alone, sister helps with food shopping. She shops about 2x/month. Occasionally her daughter or sister bring her food. She gets food stamps $57/month and goes to a food bank twice a month. She doe snot feel food insecure, although she limits her Boost to one a day because they "expensive". Her food recall lacks nutrient density. Her meal preparation is limited by her difficulty breathing.   Preferred Learning Style:  No preference indicated  Learning Readiness: Ready  ANTHROPOMETRICS: Estimated body mass index is 16.09 kg/m as calculated from the following:   Height as of this encounter: 5' 5" (1.651 m).   Weight as of this encounter: 96 lb 11.2 oz (43.9 kg).  WEIGHT HISTORY:  120-130# 2 years ago, was not eating for 24-36 hours at a time. No appetite, gave pills for depression that were supposed to help with her appetite, they made her jittery, she stopped them Highest: 134#  Lowest- current- fairly stable for last 4 months  SLEEP:melatoni does not work- tylenol PM  MEDICATIONS: tylenol PM, 7- 8 symbacort  DIETARY INTAKE: 6-7 am, not good, bedtime- 8 pm,  Usual eating pattern includes 2-3 meals and 3-5 snacks per day. Everyday foods include bacon  .  Avoided foods include uses milk but doesn't drink it.  Food Intolerances: acidic foods make her cough Nausea: n,  Vomiting: n, Diarrhea: n, Constipation: n, Any hair loss: n Dining Out (times/week): 0 Boost one time a day boost plus 24-hr recall: keeps water by her bed B ( 7 AM): boost plus most days Snk ( AM): all day- bacon x 6-7 or seldom an egg or grits L ( 12-1 PM): tv dinner- mcormick- salisbury steak, beef steak, corn apple mashed juice 12 oz 5-6 times a day  feels like she has not eaten 30 minute later- chips-1-2 bags/day, ritz crackers with peanut butter, whole cake D  ( 6-9 PM): tv dinner, beef, rice,juice Snk ( 12-1 PM): bag of chips , juice Beverages: flavored juice, sodas, oj every morning  Usual physical activity:   Estimated daily energy consumption 2100-2300 calories/day Estimated daily calorie needs for weight gain: 2100-2400 calories     Intervention:  Nutrition educationa bout higher nutrient denisty foods, label reading Action Goal: read label on frozen dinner ; thinks about how to add calories and more nutrition to them, do exercsies  Outcome goal: weight gain and improved nutritional status Coordination of care: none  Teaching Method Utilized: Visual, Auditory,Hands on Handouts given during visit include:eating for weight gain, exercises and exercise band Barriers to learning/adherence to lifestyle change: competing values, lung function Demonstrated degree of understanding via:  Teach Back  PLan- see patient instruction. Follow up to monitoring weight and food intake. Mail coupons for boost. Free supplements when available Debera Lat, RD 06/24/2019 5:22 PM. .

## 2019-06-24 ENCOUNTER — Encounter: Payer: Self-pay | Admitting: Dietician

## 2019-07-18 ENCOUNTER — Other Ambulatory Visit: Payer: Self-pay | Admitting: Internal Medicine

## 2019-07-18 DIAGNOSIS — J449 Chronic obstructive pulmonary disease, unspecified: Secondary | ICD-10-CM

## 2019-07-21 ENCOUNTER — Encounter: Payer: Self-pay | Admitting: Internal Medicine

## 2019-07-21 ENCOUNTER — Other Ambulatory Visit: Payer: Self-pay | Admitting: Internal Medicine

## 2019-07-21 ENCOUNTER — Ambulatory Visit (INDEPENDENT_AMBULATORY_CARE_PROVIDER_SITE_OTHER): Payer: Medicare Other | Admitting: Internal Medicine

## 2019-07-21 ENCOUNTER — Other Ambulatory Visit: Payer: Self-pay

## 2019-07-21 ENCOUNTER — Telehealth: Payer: Self-pay | Admitting: *Deleted

## 2019-07-21 ENCOUNTER — Ambulatory Visit: Payer: Medicare Other | Admitting: Dietician

## 2019-07-21 ENCOUNTER — Encounter: Payer: Self-pay | Admitting: Dietician

## 2019-07-21 DIAGNOSIS — S9002XA Contusion of left ankle, initial encounter: Secondary | ICD-10-CM

## 2019-07-21 DIAGNOSIS — X58XXXA Exposure to other specified factors, initial encounter: Secondary | ICD-10-CM

## 2019-07-21 DIAGNOSIS — Z7982 Long term (current) use of aspirin: Secondary | ICD-10-CM

## 2019-07-21 DIAGNOSIS — J449 Chronic obstructive pulmonary disease, unspecified: Secondary | ICD-10-CM | POA: Diagnosis not present

## 2019-07-21 DIAGNOSIS — T148XXA Other injury of unspecified body region, initial encounter: Secondary | ICD-10-CM | POA: Insufficient documentation

## 2019-07-21 MED ORDER — ALBUTEROL SULFATE HFA 108 (90 BASE) MCG/ACT IN AERS: 2.0000 | INHALATION_SPRAY | Freq: Four times a day (QID) | RESPIRATORY_TRACT | 5 refills | Status: DC | PRN

## 2019-07-21 MED ORDER — ALBUTEROL SULFATE HFA 108 (90 BASE) MCG/ACT IN AERS: 2.0000 | INHALATION_SPRAY | Freq: Four times a day (QID) | RESPIRATORY_TRACT | 3 refills | Status: DC | PRN

## 2019-07-21 NOTE — Telephone Encounter (Signed)
Received fax from [facility] requesting Ventolin HFA be changed to [employer] 2/2 insurance coverage. Please send Rx for Proair. Thanks!

## 2019-07-21 NOTE — Progress Notes (Signed)
Documentation for visit to encourage weight gain:  Mary Jenkins has 1#  weight since her last visit. She reports having trouble fitting meals in during the day, getting nauseated at times and needs to stop eating and getting out of breathe preparing foods as barriers to her weight gain. Her son brings her food and she says her daughter will also help her with meal preparation if she asks.   She denies difficulty with constipation, chewing or swallowing. However, she does say her stools are small and hard. Samples and coupons of Boost provided today along with samples of carnation instant breakfast  Wt Readings from Last 5 Encounters:  07/21/19 95 lb 11.2 oz (43.4 kg)  07/21/19 95 lb 11.2 oz (43.4 kg)  06/23/19 96 lb 11.2 oz (43.9 kg)  06/15/19 97 lb 1.6 oz (44 kg)  05/28/19 99 lb 6.4 oz (45.1 kg)   Encouraged patient to think about enlisting help for meal preparation and increasing nutrient an calorie density.  Debera Lat, RD 07/21/2019 3:07 PM.  .

## 2019-07-21 NOTE — Telephone Encounter (Signed)
Pro-air done

## 2019-07-21 NOTE — Patient Instructions (Addendum)
Hi Mary Jenkins,   It was good seeing you today! Boost plus is a good choice for you.  Since you are already drinking two of these a day- here are some other disease to help you get more nutrition from your food:  Suggestions for beverages that are more nutritious than regular soda  Soy milk 100% orange juice/100% any other type of juice  Whole milk/Lactaid Milk or soymilk with carnation instant breakfast Consider drinking a 3rd BOOST  Smoothies-   Orange Julius smoothie recipe mix 1 container frozen orange juice,  fill same  container with whole milk fill again with ice  add 1/4- 1/2 cup sugar ENJOY!!!  Have you thought about taking to your doctor about getting help in your home to assist you with meal preparation?    I suggest follow up in 1 month or as needed.  Butch Penny [phone]

## 2019-07-21 NOTE — Patient Instructions (Signed)
Thank you for allowing [place] to care for you  For your bruising - This appears benign at this time - Stop taking aspirin - We will continue to monitor for now

## 2019-07-21 NOTE — Telephone Encounter (Signed)
WALK IN  pt seeing donnap., ask to see pt, she shows an area looks like a bruiseon lower leg, states it is not tender, has not had accident, it is long and uneven edges,  appr 4cm long. States she gets them on her arms and legs from time to time, can not give an exact time schedule. Desires to see md for eval. [facility] at [phone]

## 2019-07-21 NOTE — Telephone Encounter (Signed)
Also pharm requests that albuterol inhaler be changed to proair due to insurance

## 2019-07-21 NOTE — Progress Notes (Signed)
   CC: Bruising  HPI:  [name] is a 63 y.o. F with PMHx listed below presenting for bruising. Please see the A&P for the status of the patient's chronic medical problems.  Past Medical History:  Diagnosis Date  . Breast cancer ([place])   . Cataract, immature    bilateral  . COPD (chronic obstructive pulmonary disease) ([place])    no home O2  . Cough 02/09/2015  . DDD (degenerative disc disease), cervical   . Exertional shortness of breath    states if she "takes her time" doing activities does not get SOB  . Family history of adverse reaction to anesthesia    pt's sister has hx. of post-op N/V  . Full dentures   . GERD (gastroesophageal reflux disease) 02/03/2012  . High cholesterol    no current med.  Marland Kitchen History of breast cancer   . Personal history of chemotherapy 1997  . Runny nose 02/09/2015   clear drainage, per pt.   Review of Systems:  Performed and all others negative.  Physical Exam:  Vitals:   07/21/19 1447  BP: 110/84  Pulse: 82  Temp: 98.2 F (36.8 C)  TempSrc: Oral  SpO2: 97%  Weight: 95 lb 11.2 oz (43.4 kg)  Height: 5' 5.5" (1.664 m)   Physical Exam Constitutional:      General: She is not in acute distress.    Appearance: Normal appearance.     Comments: Thin female  Cardiovascular:     Rate and Rhythm: Normal rate and regular rhythm.     Pulses: Normal pulses.     Heart sounds: Normal heart sounds.  Pulmonary:     Effort: Pulmonary effort is normal. No respiratory distress.     Breath sounds: Normal breath sounds.  Abdominal:     General: Bowel sounds are normal. There is no distension.     Palpations: Abdomen is soft.     Tenderness: There is no abdominal tenderness.  Musculoskeletal:        General: No swelling or deformity.  Skin:    General: Skin is warm and dry.     Findings: Bruising (2-3 cm bruis at Left ankle) present.  Neurological:     General: No focal deficit present.     Mental Status: Mental status is at baseline.      Assessment & Plan:   See Encounters Tab for problem based charting.  Patient discussed with Dr. Lynnae January

## 2019-07-21 NOTE — Telephone Encounter (Signed)
Agree. thanks

## 2019-07-21 NOTE — Assessment & Plan Note (Signed)
Patient presents after bruising was noted at her nutrition counseling visit earlier today. She has a 2-3cm bruise on her left ankle. She states she has been getting similar bruises on her arms and legs for the past several months at least. The bruises are non painful and resolve spontaneously. She has no bruising on her head nor torso. She denies blood/dark stool, bloody in her urine, or mucosa bleeding. She denies trauma. She takes a daily aspirin for primary prevention.  Her distrubution of bruising is more consistent with easy bruising in the setting of aspirin use and thin skin given chronic COPD. We will continue to monitor for changes and stop daily ASA as this is no longer recommended for primary prevention. - STOP daily ASA - Continue to monitor

## 2019-07-21 NOTE — Addendum Note (Signed)
Addended by: Larey Dresser A on: 07/21/2019 02:38 PM   Modules accepted: Orders

## 2019-07-23 NOTE — Progress Notes (Signed)
Internal Medicine Clinic Attending  Case discussed with Dr. Trilby Drummer at the time of the visit.  We reviewed the resident's history and exam and pertinent patient test results.  I agree with the assessment, diagnosis, and plan of care documented in the resident's note.

## 2019-09-02 ENCOUNTER — Telehealth: Payer: Self-pay | Admitting: Internal Medicine

## 2019-09-02 ENCOUNTER — Ambulatory Visit (INDEPENDENT_AMBULATORY_CARE_PROVIDER_SITE_OTHER): Payer: Medicare Other | Admitting: Adult Health

## 2019-09-02 ENCOUNTER — Encounter: Payer: Self-pay | Admitting: Adult Health

## 2019-09-02 DIAGNOSIS — J441 Chronic obstructive pulmonary disease with (acute) exacerbation: Secondary | ICD-10-CM

## 2019-09-02 DIAGNOSIS — J9611 Chronic respiratory failure with hypoxia: Secondary | ICD-10-CM | POA: Diagnosis not present

## 2019-09-02 MED ORDER — AZITHROMYCIN 250 MG PO TABS: ORAL_TABLET | ORAL | 0 refills | Status: AC

## 2019-09-02 MED ORDER — PREDNISONE 10 MG PO TABS: ORAL_TABLET | ORAL | 0 refills | Status: DC

## 2019-09-02 NOTE — Patient Instructions (Addendum)
Begin Z-Pak, take as directed Prednisone taper over the next week Mucinex DM twice daily as needed for cough and congestion Fluids and rest Continue on Symbicort and Spiriva We are sending you out for COVID-19 testing Follow-up with Dr. Melvyn Novas in 6 weeks as planned and as needed Please contact office for sooner follow up if symptoms do not improve or worsen or seek emergency care       Person Under Monitoring Name: Mary Jenkins  Location: [place] [address]   Infection Prevention Recommendations for Individuals Confirmed to have, or Being Evaluated for, 2019 Novel Coronavirus (COVID-19) Infection Who Receive Care at Home  Individuals who are confirmed to have, or are being evaluated for, COVID-19 should follow the prevention steps below until a healthcare provider or local or state health department says they can return to normal activities.  Stay home except to get medical care You should restrict activities outside your home, except for getting medical care. Do not go to work, school, or public areas, and do not use public transportation or taxis.  Call ahead before visiting your doctor Before your medical appointment, call the healthcare provider and tell them that you have, or are being evaluated for, COVID-19 infection. This will help the healthcare provider's office take steps to keep other people from getting infected. Ask your healthcare provider to call the local or state health department.  Monitor your symptoms Seek prompt medical attention if your illness is worsening (e.g., difficulty breathing). Before going to your medical appointment, call the healthcare provider and tell them that you have, or are being evaluated for, COVID-19 infection. Ask your healthcare provider to call the local or state health department.  Wear a facemask You should wear a facemask that covers your nose and mouth when you are in the same room with other  people and when you visit a healthcare provider. People who live with or visit you should also wear a facemask while they are in the same room with you.  Separate yourself from other people in your home As much as possible, you should stay in a different room from other people in your home. Also, you should use a separate bathroom, if available.  Avoid sharing household items You should not share dishes, drinking glasses, cups, eating utensils, towels, bedding, or other items with other people in your home. After using these items, you should wash them thoroughly with soap and water.  Cover your coughs and sneezes Cover your mouth and nose with a tissue when you cough or sneeze, or you can cough or sneeze into your sleeve. Throw used tissues in a lined trash can, and immediately wash your hands with soap and water for at least 20 seconds or use an alcohol-based hand rub.  Wash your [employer] your hands often and thoroughly with soap and water for at least 20 seconds. You can use an alcohol-based hand sanitizer if soap and water are not available and if your hands are not visibly dirty. Avoid touching your eyes, nose, and mouth with unwashed hands.   Prevention Steps for Caregivers and Household Members of Individuals Confirmed to have, or Being Evaluated for, COVID-19 Infection Being Cared for in the Home  If you live with, or provide care at home for, a person confirmed to have, or being evaluated for, COVID-19 infection please follow these guidelines to prevent infection:  Follow healthcare provider's instructions Make sure that you understand and can help the patient follow  any healthcare provider instructions for all care.  Provide for the patient's basic needs You should help the patient with basic needs in the home and provide support for getting groceries, prescriptions, and other personal needs.  Monitor the patient's symptoms If they are getting sicker, call his or her  medical provider and tell them that the patient has, or is being evaluated for, COVID-19 infection. This will help the healthcare provider's office take steps to keep other people from getting infected. Ask the healthcare provider to call the local or state health department.  Limit the number of people who have contact with the patient  If possible, have only one caregiver for the patient.  Other household members should stay in another home or place of residence. If this is not possible, they should stay  in another room, or be separated from the patient as much as possible. Use a separate bathroom, if available.  Restrict visitors who do not have an essential need to be in the home.  Keep older adults, very young children, and other sick people away from the patient Keep older adults, very young children, and those who have compromised immune systems or chronic health conditions away from the patient. This includes people with chronic heart, lung, or kidney conditions, diabetes, and cancer.  Ensure good ventilation Make sure that shared spaces in the home have good air flow, such as from an air conditioner or an opened window, weather permitting.  Wash your hands often  Wash your hands often and thoroughly with soap and water for at least 20 seconds. You can use an alcohol based hand sanitizer if soap and water are not available and if your hands are not visibly dirty.  Avoid touching your eyes, nose, and mouth with unwashed hands.  Use disposable paper towels to dry your hands. If not available, use dedicated cloth towels and replace them when they become wet.  Wear a facemask and gloves  Wear a disposable facemask at all times in the room and gloves when you touch or have contact with the patient's blood, body fluids, and/or secretions or excretions, such as sweat, saliva, sputum, nasal mucus, vomit, urine, or feces.  Ensure the mask fits over your nose and mouth tightly, and do  not touch it during use.  Throw out disposable facemasks and gloves after using them. Do not reuse.  Wash your hands immediately after removing your facemask and gloves.  If your personal clothing becomes contaminated, carefully remove clothing and launder. Wash your hands after handling contaminated clothing.  Place all used disposable facemasks, gloves, and other waste in a lined container before disposing them with other household waste.  Remove gloves and wash your hands immediately after handling these items.  Do not share dishes, glasses, or other household items with the patient  Avoid sharing household items. You should not share dishes, drinking glasses, cups, eating utensils, towels, bedding, or other items with a patient who is confirmed to have, or being evaluated for, COVID-19 infection.  After the person uses these items, you should wash them thoroughly with soap and water.  Wash laundry thoroughly  Immediately remove and wash clothes or bedding that have blood, body fluids, and/or secretions or excretions, such as sweat, saliva, sputum, nasal mucus, vomit, urine, or feces, on them.  Wear gloves when handling laundry from the patient.  Read and follow directions on labels of laundry or clothing items and detergent. In general, wash and dry with the warmest temperatures recommended on  the label.  Clean all areas the individual has used often  Clean all touchable surfaces, such as counters, tabletops, doorknobs, bathroom fixtures, toilets, phones, keyboards, tablets, and bedside tables, every day. Also, clean any surfaces that may have blood, body fluids, and/or secretions or excretions on them.  Wear gloves when cleaning surfaces the patient has come in contact with.  Use a diluted bleach solution (e.g., dilute bleach with 1 part bleach and 10 parts water) or a household disinfectant with a label that says EPA-registered for coronaviruses. To make a bleach solution at  home, add 1 tablespoon of bleach to 1 quart (4 cups) of water. For a larger supply, add  cup of bleach to 1 gallon (16 cups) of water.  Read labels of cleaning products and follow recommendations provided on product labels. Labels contain instructions for safe and effective use of the cleaning product including precautions you should take when applying the product, such as wearing gloves or eye protection and making sure you have good ventilation during use of the product.  Remove gloves and wash hands immediately after cleaning.  Monitor yourself for signs and symptoms of illness Caregivers and household members are considered close contacts, should monitor their health, and will be asked to limit movement outside of the home to the extent possible. Follow the monitoring steps for close contacts listed on the symptom monitoring form.   ? If you have additional questions, contact your local health department or call the epidemiologist on call at [phone] (available 24/7). ? This guidance is subject to change. For the most up-to-date guidance from [facility], please refer to their website: [email or web address]

## 2019-09-02 NOTE — Telephone Encounter (Signed)
I called and spoke with the patient and she denies fever, sob, or coughing up any colored sputum. She states that its all in her head and she has an overall malaise. Please advise.

## 2019-09-02 NOTE — Telephone Encounter (Signed)
Please set up for sick visit televisit/video visit with me today to address issues   Please contact office for sooner follow up if symptoms do not improve or worsen or seek emergency care

## 2019-09-02 NOTE — Progress Notes (Addendum)
Virtual Visit via Telephone Note  I connected with Mary Jenkins on 09/02/19 at 12:00 PM EST by telephone and verified that I am speaking with the correct person using two identifiers.  Location: Patient: Home  Provider: Office    I discussed the limitations, risks, security and privacy concerns of performing an evaluation and management service by telephone and the availability of in person appointments. I also discussed with the patient that there may be a patient responsible charge related to this service. The patient expressed understanding and agreed to proceed.   History of Present Illness: 63 year old female former smoker followed for COPD and chronic respiratory failure on home oxygen  Today's televisit is an acute office visit for COPD.  Patient complains of 2 weeks of nasal congestion drainage cough with congestion.  She says she exposed to her grandson who had cold-like symptoms.  She says she has no fever.  Denies loss of taste or smell.  She remains on Symbicort and Spiriva.  Has had no increased albuterol use.  She remains on oxygen 3 L.  Says her oxygen levels have been okay she has not had any increased oxygen use. Appetite is fair with no nausea vomiting or diarrhea.   Observations/Objective:  FEV1 0.39 ( 16%) ratio 23 and 16% better p B2        - Alpha one genotype 02/13/12 > MM   - 03/22/2013  PFT's 0.56 ( 24%) ratio 33 but 12% better improvement and DLCO 21 corrects to 28%    -Spirometry  05/31/2016  FEV1 0.52 (23%)  Ratio 32     - PFT's  08/29/17   FEV1 0.51 (22 % ) ratio 32  p 5 % improvement from saba p ? prior to study with DLCO  24 % corrects to 33 % for alv volume      Assessment and Plan: COPD exacerbation with URI/early bronchitis  Plan  Patient Instructions  Begin Z-Pak, take as directed Prednisone taper over the next week Mucinex DM twice daily as needed for cough and congestion Fluids and rest Continue on Symbicort and Spiriva We are sending you  out for COVID-19 testing Follow-up with Dr. Melvyn Novas in 6 weeks as planned and as needed Please contact office for sooner follow up if symptoms do not improve or worsen or seek emergency care       Follow Up Instructions:  Follow-up in 6 weeks and as needed  Please contact office for sooner follow up if symptoms do not improve or worsen or seek emergency care   I discussed the assessment and treatment plan with the patient. The patient was provided an opportunity to ask questions and all were answered. The patient agreed with the plan and demonstrated an understanding of the instructions.   The patient was advised to call back or seek an in-person evaluation if the symptoms worsen or if the condition fails to improve as anticipated.  I provided  22 minutes of non-face-to-face time during this encounter.   Rexene Edison, NP

## 2019-09-02 NOTE — Telephone Encounter (Signed)
Televisit made with Rexene Edison, NP today for 12 noon. Nothing further is needed.

## 2019-09-03 ENCOUNTER — Other Ambulatory Visit: Payer: Self-pay

## 2019-09-03 DIAGNOSIS — Z20822 Contact with and (suspected) exposure to covid-19: Secondary | ICD-10-CM

## 2019-09-05 LAB — NOVEL CORONAVIRUS, NAA: SARS-CoV-2, NAA: NOT DETECTED

## 2019-09-13 ENCOUNTER — Telehealth: Payer: Self-pay | Admitting: Internal Medicine

## 2019-09-13 NOTE — Telephone Encounter (Signed)
Application has been filled out and placed on MW's desk for signature. Will route to myself for follow up tomorrow after it has been signed.

## 2019-09-13 NOTE — Telephone Encounter (Signed)
Last office visit 12.10.2020 with Tammy NP for televisit for acute COPD visit.  Last handicap placard was signed by Dr Melvyn Novas 5.14.2020 because she thought her placard at that time was about to expire but it did not actually expire until Jan 2021.    Patient is unsure if the DMV will accept the 02/04/19 handicap placard now that it's so far out of date and I requesting a new one.  Can be mailed to her verified home address.  Dr Melvyn Novas please advise, thank you.  COPD pt on 3L with activity.

## 2019-09-13 NOTE — Telephone Encounter (Signed)
Fine with me

## 2019-09-14 NOTE — Telephone Encounter (Signed)
Mary Jenkins is currently working with MW today. Mary Jenkins, please advise if Dr. Melvyn Novas handed this back to you?

## 2019-09-15 NOTE — Telephone Encounter (Signed)
Dr. Melvyn Novas, please advise if you have the dmv form?

## 2019-09-15 NOTE — Telephone Encounter (Signed)
Dr. Melvyn Novas did not give me any signed paperwork yesterday. Unless he has it in his office.

## 2019-09-15 NOTE — Telephone Encounter (Signed)
Spoke with patient. She is aware that MW has signed the form and I will place in the mail today.   Nothing further needed at time of call.

## 2019-09-15 NOTE — Telephone Encounter (Signed)
Done

## 2019-10-22 ENCOUNTER — Ambulatory Visit: Payer: Medicare Other | Admitting: Internal Medicine

## 2019-10-25 ENCOUNTER — Encounter: Payer: Self-pay | Admitting: Internal Medicine

## 2019-10-25 ENCOUNTER — Other Ambulatory Visit: Payer: Self-pay

## 2019-10-25 ENCOUNTER — Ambulatory Visit (INDEPENDENT_AMBULATORY_CARE_PROVIDER_SITE_OTHER): Payer: Medicare Other | Admitting: Internal Medicine

## 2019-10-25 DIAGNOSIS — J9611 Chronic respiratory failure with hypoxia: Secondary | ICD-10-CM | POA: Diagnosis not present

## 2019-10-25 DIAGNOSIS — J449 Chronic obstructive pulmonary disease, unspecified: Secondary | ICD-10-CM

## 2019-10-25 MED ORDER — BREZTRI AEROSPHERE 160-9-4.8 MCG/ACT IN AERO: 2.0000 | INHALATION_SPRAY | Freq: Two times a day (BID) | RESPIRATORY_TRACT | 11 refills | Status: DC

## 2019-10-25 MED ORDER — BREZTRI AEROSPHERE 160-9-4.8 MCG/ACT IN AERO: 2.0000 | INHALATION_SPRAY | Freq: Two times a day (BID) | RESPIRATORY_TRACT | 0 refills | Status: DC

## 2019-10-25 MED ORDER — PREDNISONE 10 MG PO TABS: ORAL_TABLET | ORAL | 0 refills | Status: DC

## 2019-10-25 NOTE — Patient Instructions (Signed)
Work on inhaler technique:  relax and gently blow all the way out then take a nice smooth deep breath back in, triggering the inhaler at same time you start breathing in.  Hold for up to 5 seconds if you can. Blow breztri out thru nose. Rinse and gargle with water when done.  Plan A = Automatic = Always=   Breztri Take 2 puffs first thing in am and then another 2 puffs about 12 hours later.   Prednisone 10 mg take  4 each am x 2 days,   2 each am x 2 days,  1 each am x 2 days and stop   Plan B = Backup (to supplement plan A, not to replace it) Only use your albuterol inhaler (proair)as a rescue medication to be used if you can't catch your breath by resting or doing a relaxed purse lip breathing pattern.  - The less you use it, the better it will work when you need it. - Ok to use the inhaler up to 2 puffs  every 4 hours if you must but call for appointment if use goes up over your usual need - Don't leave home without it !!  (think of it like the spare tire for your car)   Plan C = Crisis (instead of Plan B but only if Plan B stops working) - only use your albuterol nebulizer if you first try Plan B and it fails to help > ok to use the nebulizer up to every 4 hours but if start needing it regularly call for immediate appointment   Please schedule a follow up office visit in 6 weeks, call sooner if needed

## 2019-10-25 NOTE — Progress Notes (Signed)
Subjective:    Patient ID: Mary Jenkins, female    DOB: 1956/06/21  MRN: [id number]    Brief patient profile:   11  yobf quit smoking  10/31/13not taking breathing medications at that point and subsequently placed on multiple meds and still sob just getting dressed so referred from the medicine clinic at [facility] 02/13/2012 for pulmonary eval with documented GOLD IV COPD 02/2013    History of Present Illness  02/13/2012 1st pulmonary cc progressive worse x 6 months doe x 50-100 ft can't do a grocery store where could do before quit. No real variability, cough is better with no excess or purulent sputum.  Not using saba daytime because finds if she's holding still doesn't need it as oftern. Some better p last  Prednisone rx. Not much variability. rec Stop atrovent and advair Start symbiocort Take 2 puffs first thing in am and then another 2 puffs about 12 hours later.  Take after only am dose Spiriva Protonix 40 mg Take 30-60 min before first meal of the day  GERD diet Only use your albuterol (Plan B= ventolin puffer,  Plan C is nebulizer) as a rescue medication to be used if you can't catch your breath by resting or doing a relaxed purse lip breathing pattern. The less you use it, the better it will work when you need it. Ok to use rescue up to every 4 hours if doing poorly.       12/18/2017  f/u ov/[name] re:  COPD IV/ symb/ spiriva tapering pred  Chief Complaint  Patient presents with  . Follow-up    SOB with activity, productive cough -yellow thick mucus, slight wheezing at night more   Dyspnea:  MMRC3 = can't walk 100 yards even at a slow pace at a flat grade s stopping due to sob  Cough: congested cough  Sleep: ok flat most noct SABA use:  Not using  In any form  Rec Work on inhaler technique:  Finish prednisone as you  plan  For cough > mucinex dm up to 1200 mg daily and use flutter valve as much as possible  Whenever cough flares should be on priloec 20 mg Take 30- 60 min before  your first and last meals of the day then ok when cough better for a week to leave the prilosec back off  Please schedule a follow up office visit in 4 weeks, sooner if needed  with all medications /inhalers/ solutions in hand so we can verify exactly what you are taking. This includes all medications from all doctors and over the counters     01/15/2018  f/u ov/[name] re: copd iv/ did not bring meds  Chief Complaint  Patient presents with  . Follow-up    Reports increased SOB since weather has changed. Reports her daughter used some kind of incense mist and her breathing has been worse every since. No improvement noted with neb or inhaler.   Dyspnea:  Still MMRC3 = can't walk 100 yards even at a slow pace at a flat grade s stopping due to sob   Cough: worse with certain foods / exp to incense / non prod Sleep:   No 02 / flat ok  SABA use:  Last used 2 weeks prior to OV   Feels much better on prednisone  And worse off it  Taking ppi hs       07/14/2018  f/u ov/[name] re:  Copd GOLD IV/ 02 dep / ? slt flare p uri  exp  Chief Complaint  Patient presents with  . Follow-up    Increased SOB recently- relates to her grandson having a cold. She has some chest tightness. She is using her albuterol inhaler 2 x daily and rarely use her neb.    Dyspnea:  [employer] on 3lpm poc but does not check sats Cough: min cough / nothing purulent Sleeping: flat / no pillows SABA use: as above/ chest tightness better p saba though hfa ti too short - see a/p  02: 2lpm at hs / 3lpm poc  / going back to school and present POC too noisy so turns it off then rec Spiriva should be 4 puffs of the blue or 2 pffs of the green  Tipped  spiriva each am Prednisone 10 mg take  4 each am x 2 days,   2 each am x 2 days,  1 each am x 2 days and stop   Continue omeprazole 20 mg Take 30-60 min before first meal of the day but add another dose 30 min supper for respiratory flares Plan B = Backup Only use your albuterol (ventolin)  as a rescue medication  Plan C = Crisis - only use your albuterol nebulizer if you first try Plan B and it fails to help > ok to use the nebulizer up to every 4 hours but if start needing it regularly call for immediate appointment   10/21/2018  f/u ov/[name] re:  Copd GOLD IV /  Chief Complaint  Patient presents with  . Follow-up    Pt c/o increased cough and SOB over the past month.  She is using the ventolin inhaler multiple times per day. She rarely uses neb.    Dyspnea:  Last food lion x few months Cough: more congestion/ esp in am and hs  Sleeping: prop up 30 degrees vs before  SABA use: as above, much more since ran out of spiriva  02: 2lpm hs,  None sitting,  2-3 poc  rec Plan A = Automatic = symbicort / spiriva 2 pffs of each first thing in am then 12 hours later symbicort Plan B = Backup Only use your albuterol inhaler as a rescue medication Plan C = Crisis - only use your albuterol nebulizer if you first try Plan B and it fails to help > ok to use the nebulizer up to every 4 hours but if start needing it regularly call for immediate appointment Prednisone 10 mg take  4 each am x 2 days,   2 each am x 2 days,  1 each am x 2 days and stop    Date of Admission: 01/11/2019    Date of Discharge: 01/12/2019    Discharge Diagnosis: 1.  Acute exacerbation of COPD    TAKE these medications   albuterol (2.5 MG/3ML) 0.083% nebulizer solution Commonly known as:  PROVENTIL USE 3 ML IN NEBULIZER  EVERY 6 HOURS AS NEEDED FOR WHEEZING OR SHORTNESS OF BREATH What changed:  See the new instructions.   Ventolin HFA 108 (90 Base) MCG/ACT inhaler Generic drug:  albuterol INHALE 2 PUFFS BY MOUTH EVERY 6 HOURS AS NEEDED FOR WHEEZING What changed:    how much to take  how to take this  when to take this  reasons to take this  additional instructions   aspirin EC 81 MG tablet Take 81 mg by mouth daily.   diclofenac sodium 1 % Gel Commonly known as:  Voltaren Apply 2 g  topically 4 (four) times daily. What  changed:    when to take this  reasons to take this   omeprazole 20 MG capsule Commonly known as:  PRILOSEC Take 1 capsule (20 mg total) by mouth 2 (two) times daily before a meal.   OXYGEN Place 2-3 L into the nose 2 (two) times a day. 2lpm with rest and 3 with exertion   predniSONE 20 MG tablet Commonly known as:  DELTASONE Take 2 tablets (40 mg total) by mouth daily with breakfast. Start taking on:  January 13, 2019   pregabalin 75 MG capsule Commonly known as:  Lyrica Take 1 capsule (75 mg total) by mouth 2 (two) times daily.   Restasis 0.05 % ophthalmic emulsion Generic drug:  cycloSPORINE Place 2 drops into both eyes daily.   Symbicort 160-4.5 MCG/ACT inhaler Generic drug:  budesonide-formoterol Inhale 2 puffs by mouth twice daily   Tiotropium Bromide Monohydrate 2.5 MCG/ACT Aers Commonly known as:  Spiriva Respimat Inhale 2 puffs into the lungs daily.   valACYclovir 1000 MG tablet Commonly known as:  VALTREX TAKE ONE TABLET BY MOUTH TWICE DAILY FOR 3 DAYS AS NEEDED What changed:    how much to take  how to take this  when to take this  reasons to take this  additional instructions       Disposition and follow-up:   Ms.Cydne CHASSITY LUDKE was discharged from [facility] in [place] condition.  At the hospital follow up visit please address:  1.  Acute exacerbation of COPD: Ensure compliance with nebulizer and inhaler.  Abstain from secondhand smoke.  2.  Labs / imaging needed at time of follow-up: None  3.  Pending labs/ test needing follow-up: None  Follow-up Appointments:  Hospital Course by problem list: 1. Acute exacerbation of COPDGOLD IV: Ms. Lepage is a 64 year old African-American woman with oxygen dependent COPDGOLD IV on 2L ,GERD, cervical degenerative joint disease who presented to [facility] emergency department on November 20, 2018 with a 1 day history of worsening  dyspnea on exertion however she denied cough, sputum production, fevers, chills, sick contact, URI symptoms, chest pain, lightheadedness,calf pain.  Her acute onset dyspnea did not appear to be secondary to pneumonia, pulmonary embolism, acute coronary syndrome or COVID-19.  She did report of exposure to secondhand smoking.  On route to the hospital she received intramuscular epinephrine, albuterol, DuoNebs, Solu-Medrol, magnesium sulfate complete resolution of symptoms.  During her hospitalization she maintain excellent oxygen saturation on her home supplemental oxygen.  She was continued on inhalers and discharged on a 5-day course of prednisone.             01/22/2019  f/u ov/Cassara Nida re: GOLD IV copd/ 02 dep/ s/p admit p 5 days of increased ventolin need and did not call for appt as rec prior to 911 and says "wasn't able to get to nebulizer in time" which denies using at all  in the 5 days leading up to admit and was exposed to daughter's cig smoke may have tipped her over.  Chief Complaint  Patient presents with  . Acute Visit    Pt c/o chest tightness since d/c from hospital 01/12/2019.   breathing was improved when arrived home as was the chest tightness but worsened p completed p finished prednisone course which was much  shorter than usual  Dyspnea:  Able to get room to room on 2lpm does not titrate  Cough: none Sleeping: able to lie flat bed on one pillow ok  SABA use: not understanding how/  when to use various inhalers  = proair, dulera/combivent/ventolin 02: 2lpm hs  rec  Plan A = Automatic = symbicort 160 Take 2 puffs first thing in am and then another 2 puffs about 12 hours later and spiriva 2 pffs each am  Work on inhaler technique:    Plan B = Backup Only use your albuterol (proair or ventolin) inhaler Plan C = Crisis - only use your albuterol nebulizer if you first try Plan B and it fails to help > ok to use the nebulizer up to every 4 hours but if start needing it regularly  call for immediate appointment Prednisone 10 mg Take 4 for three days 3 for three days 2 for three days 1 for three days and stop Omeprazole 20 mg Take 30- 60 min before your first and last meals of the day     04/21/2019  f/u ov/Teaghan Melrose re:  [place] Chief Complaint  Patient presents with  . Follow-up    Breathing is overall doing well. She uses her albuterol inhaler about once per wk on average. She has not been using her neb.    Dyspnea:  Very sedentary  But doing FL  On 3lpm poc  = MMRC3 = can't walk 100 yards even at a slow pace at a flat grade s stopping due to sob   Cough: none  Sleeping: ok flat/ one pillow  SABA use: much need for saba  02: 3lpm cont at home and pulsed when out  rec No change rx    09/02/19 aecopd rx pred/ zpak > much better   10/25/2019  f/u ov/Gaige Fussner re: GOLD IV having trouble keeping up with spiriva and symb Chief Complaint  Patient presents with  . Follow-up    Breathing has been progressively worse over the past 2 months. She states she sometimes gets SOB just eating. She is using her proair 5 x per day on average and albuterol neb about 2 x per wk.   Dyspnea:  Across the room  = MMRC4  = sob if tries to leave home or while getting dressed   Cough: smoker's rattle, worse in am but denies smoking  Sleeping: flat, / one pillow SABA use: as above  02: 3lpm 24/7    No obvious day to day or daytime variability or assoc excess/ purulent sputum or mucus plugs or hemoptysis or cp or chest tightness, subjective wheeze or overt sinus or hb symptoms.   Sleeping  without nocturnal  or early am exacerbation  of respiratory  c/o's or need for noct saba. Also denies any obvious fluctuation of symptoms with weather or environmental changes or other aggravating or alleviating factors except as outlined above   No unusual exposure hx or h/o childhood pna/ asthma or knowledge of premature birth.  Current Allergies, Complete Past Medical History, Past Surgical History,  Family History, and Social History were reviewed in [employer] record.  ROS  The following are not active complaints unless bolded Hoarseness, sore throat, dysphagia, dental problems, itching, sneezing,  nasal congestion or discharge of excess mucus or purulent secretions, ear ache,   fever, chills, sweats, unintended wt loss or wt gain, classically pleuritic or exertional cp,  orthopnea pnd or arm/hand swelling  or leg swelling, presyncope, palpitations, abdominal pain, anorexia, nausea, vomiting, diarrhea  or change in bowel habits or change in bladder habits, change in stools or change in urine, dysuria, hematuria,  rash, arthralgias, visual complaints, headache, numbness, weakness or ataxia or  problems with walking or coordination,  change in mood or  memory.        Current Meds  Medication Sig  . albuterol (PROAIR HFA) 108 (90 Base) MCG/ACT inhaler Inhale 2 puffs into the lungs every 6 (six) hours as needed for wheezing or shortness of breath.  Marland Kitchen albuterol (PROVENTIL) (2.5 MG/3ML) 0.083% nebulizer solution USE 3 ML IN NEBULIZER EVERY 6 HOURS AS NEEDED FOR  WHEEZING  OR  SHORTNESS  OF  BREATH  . OXYGEN Place 2-3 L into the nose 2 (two) times a day. 2lpm with rest and 3 with exertion   . SYMBICORT 160-4.5 MCG/ACT inhaler Inhale 2 puffs by mouth twice daily  . valACYclovir (VALTREX) 1000 MG tablet TAKE 1 TABLET BY MOUTH TWICE DAILY FOR 3 DAYS AS NEEDED             Objective:   Physical Exam   10/25/2019      95   04/21/2019   99  01/22/2019      94 10/21/2018    99  Wt 120 02/13/2012  > 119 03/13/2012 > 04/22/2012 117 > 07/21/2012 122> 12/02/2012  131> 03/22/2013  131 > 04/19/2013  129  > 07/19/2013 131 > 06/06/2014 129 >  11/11/2014  132> 02/28/2015   129 > 01/16/2016  115 >  02/29/2016  110 > 05/31/2016  115  > 11/21/2016   117 > 02/21/2017   107 > 06/16/2017 102 > 08/25/2017   106 > 11/26/2017 99 > 12/18/2017 >  01/15/2018  98 > 07/14/2018 104      w/c bound thin bf nad at rest   Vital  signs reviewed  10/25/2019  - Note at rest 02 sats  92% on 3lpm pulsed       HEENT: Full dentures  HEENT : pt wearing mask not removed for exam due to covid -19 concerns.    NECK :  without JVD/Nodes/TM/ nl carotid upstrokes bilaterally   LUNGS: no acc muscle use,  Mod barrel  contour chest wall with bilateral  Distant bs s audible wheeze and  without cough on insp or exp maneuvers and mod  Hyperresonant  to  percussion bilaterally     CV:  RRR  no s3 or murmur or increase in P2, and no edema   ABD:  soft and nontender with pos mid insp Hoover's  in the supine position. No bruits or organomegaly appreciated, bowel sounds nl  MS:     ext warm without deformities, calf tenderness, cyanosis or clubbing No obvious joint restrictions   SKIN: warm and dry without lesions    NEURO:  alert, approp, nl sensorium with  no motor or cerebellar deficits apparent.              Assessment & Plan:

## 2019-10-28 ENCOUNTER — Encounter: Payer: Self-pay | Admitting: Internal Medicine

## 2019-10-28 NOTE — Assessment & Plan Note (Signed)
As of 10/25/2019  = 3lpm 24/7   Advised: Make sure you check your oxygen saturations at highest level of activity to be sure it stays over 90% and adjust upward to maintain this level if needed but remember to turn it back to previous settings when you stop (to conserve your supply).           Each maintenance medication was reviewed in detail including emphasizing most importantly the difference between maintenance and prns and under what circumstances the prns are to be triggered using an action plan format where appropriate.  Total time for H and P, chart review, counseling,   and generating customized AVS unique to this office visit / charting = 20 min

## 2019-10-28 NOTE — Assessment & Plan Note (Signed)
Quit smoking 06/2012 PFT performed on 06/27/2011. Poor quality.    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 prior to study with DLCO  24 % corrects to 33 % for alv volume    - referred to rehab again 11/26/2017 > could not afford it  - 10/25/2019  After extensive coaching inhaler device,  effectiveness =    75% (short ti)  Try breztri 2 bid      Very severe clinically  Group D in terms of symptom/risk and laba/lama/ICS  therefore appropriate rx at this point >>>  Try breztri and short course prednisone then return in 6 weeks to regroup   .Pt informed of the seriousness of COVID 19 infection as a direct risk to lung health  and safey and to close contacts and should continue to wear a facemask in public and minimize exposure to public locations but especially avoid any area or activity where non-close contacts are not observing distancing or wearing an appropriate face mask.  I strongly recommended vaccine when offered.

## 2019-11-16 ENCOUNTER — Telehealth: Payer: Self-pay | Admitting: Internal Medicine

## 2019-11-16 NOTE — Telephone Encounter (Signed)
Magda Paganini,  Sending to you has an [place]. Not sure if you have seen this paperwork yet, or if it was placed in his box.  Her last visit with Dr. Melvyn Novas was on 10/25/19 for COPD GOLD IV.

## 2019-11-19 NOTE — Telephone Encounter (Signed)
Placed in box to be signed

## 2019-11-26 NOTE — Telephone Encounter (Signed)
Please advise if this form has been taken care of. Thanks!

## 2019-11-29 NOTE — Telephone Encounter (Signed)
Forms signed and faxed to [place]

## 2019-12-02 NOTE — Progress Notes (Signed)
SUBJECTIVE:   CHIEF COMPLAINT / HPI:   COPD Past history of smoking.  Not a current smoker.  Current medications include breast tree, albuterol as needed, supplemental oxygen (home rate 3 L, no changes with ambulation).  She is seen regularly by pulmonology who recently provided a steroid burst for COPD exacerbation.  She is completed her steroid burst and feels improved today in clinic.  Is currently breathing comfortably on her home oxygen.  Breast cancer She reports a history of right-sided breast cancer roughly 23 years ago.  At that time, she underwent a unilateral mastectomy with breast reconstruction and 6 months of chemotherapy.  No radiation was needed.  She has had regular mammography since that time without any further incident.  She no longer sees an oncologist.  Cervical arthritis She has chronic neck pain from her cervical arthritis.  She occasionally experiences twinges and shooting pain from her neck.  She occasionally takes Tylenol for this.  Currently well controlled not requesting any additional intervention or medication at this time.  Weight loss She reports a history of weight loss for roughly the past 5 years.  Her normal weight in her adult life has been around 135 pounds.  She is been slowly losing weight for the past 5 years and currently weighs 95 pounds.  She would like to know if there is any medication that would be helpful for increasing her appetite or helping her gain weight.  She denies fevers, night sweats.   PERTINENT  PMH / PSH: COPD, end-stage, previous history of breast cancer.  OBJECTIVE:   BP (!) 92/58 Comment: 2nd attempt Lt arm  Pulse 88   Ht 5' 6" (1.676 m)   Wt 95 lb (43.1 kg)   SpO2 95%   BMI 15.33 kg/m    General: Alert and cooperative and appears to be in no acute distress.  Gaunt appearing though energetic and pleasant.  Traveling with her portable oxygen. HEENT: Neck non-tender without lymphadenopathy, masses  Cardio: Normal S1  and S2, no S3 or S4. Rhythm is regular. No murmurs or rubs.   Pulm: Clear to auscultation bilaterally, normal respiratory effort on 3 L nasal cannula.  No notable wheezing or crackles on my exam today. Abdomen: Bowel sounds normal. Abdomen soft and non-tender.  Extremities: No peripheral edema. Warm/ well perfused.  Strong radial pulse. Neuro: Cranial nerves grossly intact   ASSESSMENT/PLAN:   COPD GOLD IV Stable.  Currently managed well by her pulmonologist.  Continue current medications.  Continue following with pulmonology.  GERD (gastroesophageal reflux disease) No significant symptoms at this time.  No medication needed at this time.  Weight loss She is previously been assessed for this weight loss issue.  On chart review, all age-appropriate cancer screening has been done.  She is not due for a colonoscopy, she has had a recent CT chest without concerning findings, her mammography is up-to-date without concerning findings.  Previous HIV and hep C testing was unremarkable.  Our conversation today did not provide any obvious leads for new cancers. -Follow-up TSH, CBC, CMP, UA (watch for hematuria development for bladder cancer due to a significant smoking history) -We will start on mirtazapine today to help with weight gain.  Advised to start with 15 mg nightly and and to increase to 30 mg nightly if well tolerated and no significant fatigue. -We will reassess in 2 months -If this is not sufficient, consider Megace or oxandrolone per up-to-date recommendations. -Inquire regarding nutrition consult at subsequent visit  Screening for hyperlipidemia She has had elevated lipid panels in the past although her most recent was not elevated.  There is also a history of cardiac disease in her family. -Follow-up lipid panel     Matilde Haymaker, MD [place]

## 2019-12-03 ENCOUNTER — Ambulatory Visit (INDEPENDENT_AMBULATORY_CARE_PROVIDER_SITE_OTHER): Payer: Medicare Other | Admitting: Family Medicine

## 2019-12-03 ENCOUNTER — Other Ambulatory Visit: Payer: Self-pay

## 2019-12-03 ENCOUNTER — Encounter: Payer: Self-pay | Admitting: Family Medicine

## 2019-12-03 VITALS — BP 92/58 | HR 88 | Ht 66.0 in | Wt 95.0 lb

## 2019-12-03 DIAGNOSIS — Z13220 Encounter for screening for lipoid disorders: Secondary | ICD-10-CM

## 2019-12-03 DIAGNOSIS — K219 Gastro-esophageal reflux disease without esophagitis: Secondary | ICD-10-CM

## 2019-12-03 DIAGNOSIS — E785 Hyperlipidemia, unspecified: Secondary | ICD-10-CM | POA: Diagnosis not present

## 2019-12-03 DIAGNOSIS — R634 Abnormal weight loss: Secondary | ICD-10-CM | POA: Diagnosis not present

## 2019-12-03 DIAGNOSIS — J449 Chronic obstructive pulmonary disease, unspecified: Secondary | ICD-10-CM

## 2019-12-03 LAB — POCT UA - MICROSCOPIC ONLY

## 2019-12-03 LAB — POCT URINALYSIS DIP (MANUAL ENTRY)
Glucose, UA: NEGATIVE mg/dL
Ketones, POC UA: NEGATIVE mg/dL
Nitrite, UA: NEGATIVE
Protein Ur, POC: 30 mg/dL — AB
Spec Grav, UA: >=1.03 — AB (ref 1.010–1.025)
Urobilinogen, UA: 0.2 E.U./dL
pH, UA: 6 (ref 5.0–8.0)

## 2019-12-03 MED ORDER — MIRTAZAPINE 15 MG PO TABS: 15.0000 mg | ORAL_TABLET | Freq: Every day | ORAL | 2 refills | Status: DC

## 2019-12-03 NOTE — Assessment & Plan Note (Signed)
She is previously been assessed for this weight loss issue.  On chart review, all age-appropriate cancer screening has been done.  She is not due for a colonoscopy, she has had a recent CT chest without concerning findings, her mammography is up-to-date without concerning findings.  Previous HIV and hep C testing was unremarkable.  Our conversation today did not provide any obvious leads for new cancers. -Follow-up TSH, CBC, CMP, UA (watch for hematuria development for bladder cancer due to a significant smoking history) -We will start on mirtazapine today to help with weight gain.  Advised to start with 15 mg nightly and and to increase to 30 mg nightly if well tolerated and no significant fatigue. -We will reassess in 2 months -If this is not sufficient, consider Megace or oxandrolone per up-to-date recommendations. -Inquire regarding nutrition consult at subsequent visit

## 2019-12-03 NOTE — Assessment & Plan Note (Signed)
Stable.  Currently managed well by her pulmonologist.  Continue current medications.  Continue following with pulmonology.

## 2019-12-03 NOTE — Patient Instructions (Signed)
It is great to meet you today.  I am glad we were able to cover your medical history.  For your weight loss, we will do a brief work-up to make sure were not missing anything obvious.  It is likely related to her COPD.  For now, we will try a medication called mirtazapine.  Start taking this medication once daily.  Your initial dose will be 15 mg.  If you are able to take this for 1 week without significant side effects (mainly drowsiness), then you can increase your dose to 30 mg daily (2 tablets).  Lets follow-up in 1 to 2 months to see if this is improving your weight at all.  If this is not helpful, there is other medications we can try.  I chose to start with this medication because of the side effect profile (that means that it is generally safe to use).

## 2019-12-03 NOTE — Assessment & Plan Note (Signed)
No significant symptoms at this time.  No medication needed at this time.

## 2019-12-03 NOTE — Assessment & Plan Note (Signed)
She has had elevated lipid panels in the past although her most recent was not elevated.  There is also a history of cardiac disease in her family. -Follow-up lipid panel

## 2019-12-04 LAB — CBC
Hematocrit: 38.4 % (ref 34.0–46.6)
Hemoglobin: 11.8 g/dL (ref 11.1–15.9)
MCH: 26.5 pg — ABNORMAL LOW (ref 26.6–33.0)
MCHC: 30.7 g/dL — ABNORMAL LOW (ref 31.5–35.7)
MCV: 86 fL (ref 79–97)
Platelets: 293 10*3/uL (ref 150–450)
RBC: 4.46 x10E6/uL (ref 3.77–5.28)
RDW: 14.6 % (ref 11.7–15.4)
WBC: 4.9 10*3/uL (ref 3.4–10.8)

## 2019-12-04 LAB — COMPREHENSIVE METABOLIC PANEL
ALT: 15 IU/L (ref 0–32)
AST: 20 IU/L (ref 0–40)
Albumin/Globulin Ratio: 1.7 (ref 1.2–2.2)
Albumin: 4 g/dL (ref 3.8–4.8)
Alkaline Phosphatase: 62 IU/L (ref 39–117)
BUN/Creatinine Ratio: 11 — ABNORMAL LOW (ref 12–28)
BUN: 10 mg/dL (ref 8–27)
Bilirubin Total: 0.2 mg/dL (ref 0.0–1.2)
CO2: 26 mmol/L (ref 20–29)
Calcium: 9.2 mg/dL (ref 8.7–10.3)
Chloride: 101 mmol/L (ref 96–106)
Creatinine, Ser: 0.95 mg/dL (ref 0.57–1.00)
GFR calc Af Amer: 74 mL/min/{1.73_m2} (ref >59)
GFR calc non Af Amer: 64 mL/min/{1.73_m2} (ref >59)
Globulin, Total: 2.4 g/dL (ref 1.5–4.5)
Glucose: 84 mg/dL (ref 65–99)
Potassium: 4.4 mmol/L (ref 3.5–5.2)
Sodium: 140 mmol/L (ref 134–144)
Total Protein: 6.4 g/dL (ref 6.0–8.5)

## 2019-12-04 LAB — LIPID PANEL
Chol/HDL Ratio: 1.9 ratio (ref 0.0–4.4)
Cholesterol, Total: 194 mg/dL (ref 100–199)
HDL: 101 mg/dL (ref >39)
LDL Chol Calc (NIH): 82 mg/dL (ref 0–99)
Triglycerides: 60 mg/dL (ref 0–149)
VLDL Cholesterol Cal: 11 mg/dL (ref 5–40)

## 2019-12-04 LAB — TSH: TSH: 1.16 u[IU]/mL (ref 0.450–4.500)

## 2019-12-06 ENCOUNTER — Ambulatory Visit (INDEPENDENT_AMBULATORY_CARE_PROVIDER_SITE_OTHER): Payer: Medicare Other | Admitting: Internal Medicine

## 2019-12-06 ENCOUNTER — Other Ambulatory Visit: Payer: Self-pay | Admitting: Internal Medicine

## 2019-12-06 ENCOUNTER — Encounter: Payer: Self-pay | Admitting: Internal Medicine

## 2019-12-06 ENCOUNTER — Other Ambulatory Visit: Payer: Self-pay

## 2019-12-06 DIAGNOSIS — J449 Chronic obstructive pulmonary disease, unspecified: Secondary | ICD-10-CM | POA: Diagnosis not present

## 2019-12-06 DIAGNOSIS — J9611 Chronic respiratory failure with hypoxia: Secondary | ICD-10-CM | POA: Diagnosis not present

## 2019-12-06 NOTE — Patient Instructions (Signed)
No change in medications  We will try to get you some home Respiratory Rehabilitation help.  Please schedule a follow up visit in 3 months but call sooner if needed

## 2019-12-06 NOTE — Assessment & Plan Note (Addendum)
04/13/18  Patient Saturations on Room Air at Rest = 92% Patient Saturations on [employer] while Ambulating = 87% Patient Saturations on 3 pulse Liters of oxygen while Ambulating = 93%   As of 12/06/2019  = 3lpm sleeping and ambulating but sats 94% at rest  RA   Advised: Make sure you check your oxygen saturations at highest level of activity to be sure it stays over 90% and adjust upward to maintain this level if needed but remember to turn it back to previous settings when you stop (to conserve your supply).   F/u q 3 m, sooner if needed       Each maintenance medication was reviewed in detail including emphasizing most importantly the difference between maintenance and prns and under what circumstances the prns are to be triggered using an action plan format where appropriate.  Total time for H and P, chart review, counseling, teaching device and generating customized AVS unique to this office visit / charting = 20 min

## 2019-12-06 NOTE — Assessment & Plan Note (Signed)
Quit smoking 06/2012 PFT performed on 06/27/2011. Poor quality.    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 prior to study with DLCO  24 % corrects to 33 % for alv volume    - referred to rehab again 11/26/2017 > could not afford it  - 10/25/2019  After extensive coaching inhaler device,  effectiveness =    75% (short ti)  Try breztri 2 bid    Group D in terms of symptom/risk and laba/lama/ICS  therefore appropriate rx at this point >>>  Continue trelegy and prn saba,   Pt informed of the seriousness of COVID 19 infection as a direct risk to lung health  and safey and to close contacts and should continue to wear a facemask in public and minimize exposure to public locations but especially avoid any area or activity where non-close contacts are not observing distancing or wearing an appropriate face mask.  I strongly recommended vaccine when offered.

## 2019-12-06 NOTE — Progress Notes (Signed)
Subjective:    Patient ID: Mary Jenkins, female    DOB: 1956/06/21  MRN: [id number]    Brief patient profile:   11  yobf quit smoking  10/31/13not taking breathing medications at that point and subsequently placed on multiple meds and still sob just getting dressed so referred from the medicine clinic at [facility] 02/13/2012 for pulmonary eval with documented GOLD IV COPD 02/2013    History of Present Illness  02/13/2012 1st pulmonary cc progressive worse x 6 months doe x 50-100 ft can't do a grocery store where could do before quit. No real variability, cough is better with no excess or purulent sputum.  Not using saba daytime because finds if she's holding still doesn't need it as oftern. Some better p last  Prednisone rx. Not much variability. rec Stop atrovent and advair Start symbiocort Take 2 puffs first thing in am and then another 2 puffs about 12 hours later.  Take after only am dose Spiriva Protonix 40 mg Take 30-60 min before first meal of the day  GERD diet Only use your albuterol (Plan B= ventolin puffer,  Plan C is nebulizer) as a rescue medication to be used if you can't catch your breath by resting or doing a relaxed purse lip breathing pattern. The less you use it, the better it will work when you need it. Ok to use rescue up to every 4 hours if doing poorly.       12/18/2017  f/u ov/Hailey Stormer re:  COPD IV/ symb/ spiriva tapering pred  Chief Complaint  Patient presents with  . Follow-up    SOB with activity, productive cough -yellow thick mucus, slight wheezing at night more   Dyspnea:  MMRC3 = can't walk 100 yards even at a slow pace at a flat grade s stopping due to sob  Cough: congested cough  Sleep: ok flat most noct SABA use:  Not using  In any form  Rec Work on inhaler technique:  Finish prednisone as you  plan  For cough > mucinex dm up to 1200 mg daily and use flutter valve as much as possible  Whenever cough flares should be on priloec 20 mg Take 30- 60 min before  your first and last meals of the day then ok when cough better for a week to leave the prilosec back off  Please schedule a follow up office visit in 4 weeks, sooner if needed  with all medications /inhalers/ solutions in hand so we can verify exactly what you are taking. This includes all medications from all doctors and over the counters     01/15/2018  f/u ov/Zyire Eidson re: copd iv/ did not bring meds  Chief Complaint  Patient presents with  . Follow-up    Reports increased SOB since weather has changed. Reports her daughter used some kind of incense mist and her breathing has been worse every since. No improvement noted with neb or inhaler.   Dyspnea:  Still MMRC3 = can't walk 100 yards even at a slow pace at a flat grade s stopping due to sob   Cough: worse with certain foods / exp to incense / non prod Sleep:   No 02 / flat ok  SABA use:  Last used 2 weeks prior to OV   Feels much better on prednisone  And worse off it  Taking ppi hs       07/14/2018  f/u ov/Braylen Denunzio re:  Copd GOLD IV/ 02 dep / ? slt flare p uri  exp  Chief Complaint  Patient presents with  . Follow-up    Increased SOB recently- relates to her grandson having a cold. She has some chest tightness. She is using her albuterol inhaler 2 x daily and rarely use her neb.    Dyspnea:  [employer] on 3lpm poc but does not check sats Cough: min cough / nothing purulent Sleeping: flat / no pillows SABA use: as above/ chest tightness better p saba though hfa ti too short - see a/p  02: 2lpm at hs / 3lpm poc  / going back to school and present POC too noisy so turns it off then rec Spiriva should be 4 puffs of the blue or 2 pffs of the green  Tipped  spiriva each am Prednisone 10 mg take  4 each am x 2 days,   2 each am x 2 days,  1 each am x 2 days and stop   Continue omeprazole 20 mg Take 30-60 min before first meal of the day but add another dose 30 min supper for respiratory flares Plan B = Backup Only use your albuterol (ventolin)  as a rescue medication  Plan C = Crisis - only use your albuterol nebulizer if you first try Plan B and it fails to help > ok to use the nebulizer up to every 4 hours but if start needing it regularly call for immediate appointment   10/21/2018  f/u ov/Mark Benecke re:  Copd GOLD IV /  Chief Complaint  Patient presents with  . Follow-up    Pt c/o increased cough and SOB over the past month.  She is using the ventolin inhaler multiple times per day. She rarely uses neb.    Dyspnea:  Last food lion x few months Cough: more congestion/ esp in am and hs  Sleeping: prop up 30 degrees vs before  SABA use: as above, much more since ran out of spiriva  02: 2lpm hs,  None sitting,  2-3 poc  rec Plan A = Automatic = symbicort / spiriva 2 pffs of each first thing in am then 12 hours later symbicort Plan B = Backup Only use your albuterol inhaler as a rescue medication Plan C = Crisis - only use your albuterol nebulizer if you first try Plan B and it fails to help > ok to use the nebulizer up to every 4 hours but if start needing it regularly call for immediate appointment Prednisone 10 mg take  4 each am x 2 days,   2 each am x 2 days,  1 each am x 2 days and stop    Date of Admission: 01/11/2019    Date of Discharge: 01/12/2019    Discharge Diagnosis: 1.  Acute exacerbation of COPD    TAKE these medications   albuterol (2.5 MG/3ML) 0.083% nebulizer solution Commonly known as:  PROVENTIL USE 3 ML IN NEBULIZER  EVERY 6 HOURS AS NEEDED FOR WHEEZING OR SHORTNESS OF BREATH What changed:  See the new instructions.   Ventolin HFA 108 (90 Base) MCG/ACT inhaler Generic drug:  albuterol INHALE 2 PUFFS BY MOUTH EVERY 6 HOURS AS NEEDED FOR WHEEZING What changed:    how much to take  how to take this  when to take this  reasons to take this  additional instructions   aspirin EC 81 MG tablet Take 81 mg by mouth daily.   diclofenac sodium 1 % Gel Commonly known as:  Voltaren Apply 2 g  topically 4 (four) times daily. What  changed:    when to take this  reasons to take this   omeprazole 20 MG capsule Commonly known as:  PRILOSEC Take 1 capsule (20 mg total) by mouth 2 (two) times daily before a meal.   OXYGEN Place 2-3 L into the nose 2 (two) times a day. 2lpm with rest and 3 with exertion   predniSONE 20 MG tablet Commonly known as:  DELTASONE Take 2 tablets (40 mg total) by mouth daily with breakfast. Start taking on:  January 13, 2019   pregabalin 75 MG capsule Commonly known as:  Lyrica Take 1 capsule (75 mg total) by mouth 2 (two) times daily.   Restasis 0.05 % ophthalmic emulsion Generic drug:  cycloSPORINE Place 2 drops into both eyes daily.   Symbicort 160-4.5 MCG/ACT inhaler Generic drug:  budesonide-formoterol Inhale 2 puffs by mouth twice daily   Tiotropium Bromide Monohydrate 2.5 MCG/ACT Aers Commonly known as:  Spiriva Respimat Inhale 2 puffs into the lungs daily.   valACYclovir 1000 MG tablet Commonly known as:  VALTREX TAKE ONE TABLET BY MOUTH TWICE DAILY FOR 3 DAYS AS NEEDED What changed:    how much to take  how to take this  when to take this  reasons to take this  additional instructions       Disposition and follow-up:   Ms.Atheena JADIS PITTER was discharged from [facility] in [place] condition.  At the hospital follow up visit please address:  1.  Acute exacerbation of COPD: Ensure compliance with nebulizer and inhaler.  Abstain from secondhand smoke.  2.  Labs / imaging needed at time of follow-up: None  3.  Pending labs/ test needing follow-up: None  Follow-up Appointments:  Hospital Course by problem list: 1. Acute exacerbation of COPDGOLD IV: Ms. Blacksher is a 64 year old African-American woman with oxygen dependent COPDGOLD IV on 2L [place],GERD, cervical degenerative joint disease who presented to [facility] emergency department on November 20, 2018 with a 1 day history of worsening  dyspnea on exertion however she denied cough, sputum production, fevers, chills, sick contact, URI symptoms, chest pain, lightheadedness,calf pain.  Her acute onset dyspnea did not appear to be secondary to pneumonia, pulmonary embolism, acute coronary syndrome or COVID-19.  She did report of exposure to secondhand smoking.  On route to the hospital she received intramuscular epinephrine, albuterol, DuoNebs, Solu-Medrol, magnesium sulfate complete resolution of symptoms.  During her hospitalization she maintain excellent oxygen saturation on her home supplemental oxygen.  She was continued on inhalers and discharged on a 5-day course of prednisone.             01/22/2019  f/u ov/Aikam Hellickson re: GOLD IV copd/ 02 dep/ s/p admit p 5 days of increased ventolin need and did not call for appt as rec prior to 911 and says "wasn't able to get to nebulizer in time" which denies using at all  in the 5 days leading up to admit and was exposed to daughter's cig smoke may have tipped her over.  Chief Complaint  Patient presents with  . Acute Visit    Pt c/o chest tightness since d/c from hospital 01/12/2019.   breathing was improved when arrived home as was the chest tightness but worsened p completed p finished prednisone course which was much  shorter than usual  Dyspnea:  Able to get room to room on 2lpm does not titrate  Cough: none Sleeping: able to lie flat bed on one pillow ok  SABA use: not understanding how/  when to use various inhalers  = proair, dulera/combivent/ventolin 02: 2lpm hs  rec  Plan A = Automatic = symbicort 160 Take 2 puffs first thing in am and then another 2 puffs about 12 hours later and spiriva 2 pffs each am  Work on inhaler technique:    Plan B = Backup Only use your albuterol (proair or ventolin) inhaler Plan C = Crisis - only use your albuterol nebulizer if you first try Plan B and it fails to help > ok to use the nebulizer up to every 4 hours but if start needing it regularly  call for immediate appointment Prednisone 10 mg Take 4 for three days 3 for three days 2 for three days 1 for three days and stop Omeprazole 20 mg Take 30- 60 min before your first and last meals of the day     04/21/2019  f/u ov/Ryosuke Ericksen re:  [place] Chief Complaint  Patient presents with  . Follow-up    Breathing is overall doing well. She uses her albuterol inhaler about once per wk on average. She has not been using her neb.    Dyspnea:  Very sedentary  But doing [place]  On 3lpm poc  = MMRC3 = can't walk 100 yards even at a slow pace at a flat grade s stopping due to sob   Cough: none  Sleeping: ok flat/ one pillow  SABA use: much need for saba  02: 3lpm cont at home and pulsed when out  rec No change rx    09/02/19 aecopd rx pred/ zpak > much better   10/25/2019  f/u ov/Anara Cowman re: GOLD IV having trouble keeping up with spiriva and symb Chief Complaint  Patient presents with  . Follow-up    Breathing has been progressively worse over the past 2 months. She states she sometimes gets SOB just eating. She is using her proair 5 x per day on average and albuterol neb about 2 x per wk.   Dyspnea:  Across the room  = MMRC4  = sob if tries to leave home or while getting dressed   Cough: smoker's rattle, worse in am but denies smoking  Sleeping: flat, / one pillow SABA use: as above  02: 3lpm 24/7  rec Work on inhaler technique  Plan A = Automatic = Always=   Breztri Take 2 puffs first thing in am and then another 2 puffs about 12 hours later.  Prednisone 10 mg take  4 each am x 2 days,   2 each am x 2 days,  1 each am x 2 days and stop  Plan B = Backup (to supplement plan A, not to replace it) Only use your albuterol inhaler (proair)as a rescue medication Plan C = Crisis (instead of Plan B but only if Plan B stops working) - only use your albuterol nebulizer if you first try Plan B and it fails to help > ok to use the nebulizer up to every 4 hours but if start needing it regularly call for  immediate appointment   12/06/2019  f/u ov/Earlyn Sylvan re:  GOLD IV / 02 dep maint on breztri 2 bid  Chief Complaint  Patient presents with  . Follow-up    Breathing has improved some since the last visit. She is using her albuterol inhaler 1-2 x per wk. She has used neb only once since the last visit.   Dyspnea:  Room to room s walker on 3lpm with activity   Cough: no purulent /  excess mucus  Sleeping: flat/ one pillow SABA use: as above 02: 3lpm at hs/ not at rest / up to 3lpm with activity, typically none at rest with sats 94%     No obvious day to day or daytime variability or assoc excess/ purulent sputum or mucus plugs or hemoptysis or cp or chest tightness, subjective wheeze or overt sinus or hb symptoms.   Sleeping as above  without nocturnal  or early am exacerbation  of respiratory  c/o's or need for noct saba. Also denies any obvious fluctuation of symptoms with weather or environmental changes or other aggravating or alleviating factors except as outlined above   No unusual exposure hx or h/o childhood pna/ asthma or knowledge of premature birth.  Current Allergies, Complete Past Medical History, Past Surgical History, Family History, and Social History were reviewed in [employer] record.  ROS  The following are not active complaints unless bolded Hoarseness, sore throat, dysphagia, dental problems, itching, sneezing,  nasal congestion or discharge of excess mucus or purulent secretions, ear ache,   fever, chills, sweats, unintended wt loss or wt gain, classically pleuritic or exertional cp,  orthopnea pnd or arm/hand swelling  or leg swelling, presyncope, palpitations, abdominal pain, anorexia, nausea, vomiting, diarrhea  or change in bowel habits or change in bladder habits, change in stools or change in urine, dysuria, hematuria,  rash, arthralgias, visual complaints, headache, numbness, weakness or ataxia or problems with walking or coordination,  change in  mood or  memory.        Current Meds  Medication Sig  . albuterol (PROAIR HFA) 108 (90 Base) MCG/ACT inhaler Inhale 2 puffs into the lungs every 6 (six) hours as needed for wheezing or shortness of breath.  Marland Kitchen albuterol (PROVENTIL) (2.5 MG/3ML) 0.083% nebulizer solution USE 3 ML IN NEBULIZER EVERY 6 HOURS AS NEEDED FOR  WHEEZING  OR  SHORTNESS  OF  BREATH  . Budeson-Glycopyrrol-Formoterol (BREZTRI AEROSPHERE) 160-9-4.8 MCG/ACT AERO Inhale 2 puffs into the lungs 2 (two) times daily.  . mirtazapine (REMERON) 15 MG tablet Take 1 tablet (15 mg total) by mouth at bedtime.  . OXYGEN Place 2-3 L into the nose 2 (two) times a day. 2lpm with rest and 3 with exertion   . valACYclovir (VALTREX) 1000 MG tablet TAKE 1 TABLET BY MOUTH TWICE DAILY FOR 3 DAYS AS NEEDED                 Objective:   Physical Exam   12/06/2019    94 10/25/2019      95   04/21/2019   99  01/22/2019      94 10/21/2018    99  Wt 120 02/13/2012  > 119 03/13/2012 > 04/22/2012 117 > 07/21/2012 122> 12/02/2012  131> 03/22/2013  131 > 04/19/2013  129  > 07/19/2013 131 > 06/06/2014 129 >  11/11/2014  132> 02/28/2015   129 > 01/16/2016  115 >  02/29/2016  110 > 05/31/2016  115  > 11/21/2016   117 > 02/21/2017   107 > 06/16/2017 102 > 08/25/2017   106 > 11/26/2017 99 > 12/18/2017 >  01/15/2018  98 > 07/14/2018 104    Vital signs reviewed  12/06/2019  - Note at rest 02 sats  94% on RA          Report : full dentures  HEENT : pt wearing mask not removed for exam due to covid -19 concerns.    NECK :  without JVD/Nodes/TM/  nl carotid upstrokes bilaterally   LUNGS: no acc muscle use,  Mod barrel  contour chest wall with bilateral  Distant bs s audible wheeze and  without cough on insp or exp maneuvers and mod  Hyperresonant  to  percussion bilaterally     CV:  RRR  no s3 or murmur or increase in P2, and no edema   ABD:  soft and nontender with pos mid insp Hoover's  in the supine position. No bruits or organomegaly appreciated, bowel sounds nl  MS:    Very atrophic ext musculature/   ext warm without deformities, calf tenderness, cyanosis or clubbing No obvious joint restrictions   SKIN: warm and dry without lesions    NEURO:  alert, approp, nl sensorium with  no motor or cerebellar deficits apparent.                  Assessment & Plan:

## 2019-12-27 ENCOUNTER — Observation Stay (HOSPITAL_COMMUNITY)
Admission: EM | Admit: 2019-12-27 | Discharge: 2019-12-28 | Disposition: A | Discharge status: Home Health | Payer: Medicare Other | Attending: Family Medicine | Admitting: Family Medicine

## 2019-12-27 ENCOUNTER — Emergency Department (HOSPITAL_COMMUNITY): Payer: Medicare Other

## 2019-12-27 ENCOUNTER — Observation Stay (HOSPITAL_COMMUNITY): Payer: Medicare Other

## 2019-12-27 DIAGNOSIS — I700 Atherosclerosis of aorta: Secondary | ICD-10-CM | POA: Insufficient documentation

## 2019-12-27 DIAGNOSIS — K219 Gastro-esophageal reflux disease without esophagitis: Secondary | ICD-10-CM | POA: Diagnosis not present

## 2019-12-27 DIAGNOSIS — D649 Anemia, unspecified: Secondary | ICD-10-CM | POA: Diagnosis not present

## 2019-12-27 DIAGNOSIS — J441 Chronic obstructive pulmonary disease with (acute) exacerbation: Secondary | ICD-10-CM

## 2019-12-27 DIAGNOSIS — R778 Other specified abnormalities of plasma proteins: Secondary | ICD-10-CM | POA: Diagnosis not present

## 2019-12-27 DIAGNOSIS — M5030 Other cervical disc degeneration, unspecified cervical region: Secondary | ICD-10-CM | POA: Insufficient documentation

## 2019-12-27 DIAGNOSIS — R634 Abnormal weight loss: Secondary | ICD-10-CM | POA: Diagnosis not present

## 2019-12-27 DIAGNOSIS — J9611 Chronic respiratory failure with hypoxia: Secondary | ICD-10-CM

## 2019-12-27 DIAGNOSIS — Z79899 Other long term (current) drug therapy: Secondary | ICD-10-CM | POA: Diagnosis not present

## 2019-12-27 DIAGNOSIS — Z87891 Personal history of nicotine dependence: Secondary | ICD-10-CM | POA: Diagnosis not present

## 2019-12-27 DIAGNOSIS — E785 Hyperlipidemia, unspecified: Secondary | ICD-10-CM | POA: Diagnosis not present

## 2019-12-27 DIAGNOSIS — Z1211 Encounter for screening for malignant neoplasm of colon: Secondary | ICD-10-CM | POA: Diagnosis not present

## 2019-12-27 DIAGNOSIS — Z9221 Personal history of antineoplastic chemotherapy: Secondary | ICD-10-CM | POA: Insufficient documentation

## 2019-12-27 DIAGNOSIS — J439 Emphysema, unspecified: Principal | ICD-10-CM | POA: Insufficient documentation

## 2019-12-27 DIAGNOSIS — R739 Hyperglycemia, unspecified: Secondary | ICD-10-CM | POA: Diagnosis not present

## 2019-12-27 DIAGNOSIS — R0602 Shortness of breath: Secondary | ICD-10-CM

## 2019-12-27 DIAGNOSIS — Z20822 Contact with and (suspected) exposure to covid-19: Secondary | ICD-10-CM | POA: Diagnosis not present

## 2019-12-27 DIAGNOSIS — Z853 Personal history of malignant neoplasm of breast: Secondary | ICD-10-CM | POA: Diagnosis not present

## 2019-12-27 LAB — HIV ANTIBODY (ROUTINE TESTING W REFLEX): HIV Screen 4th Generation wRfx: NONREACTIVE

## 2019-12-27 LAB — BASIC METABOLIC PANEL
Anion gap: 10 (ref 5–15)
BUN: 11 mg/dL (ref 8–23)
CO2: 28 mmol/L (ref 22–32)
Calcium: 8.5 mg/dL — ABNORMAL LOW (ref 8.9–10.3)
Chloride: 104 mmol/L (ref 98–111)
Creatinine, Ser: 0.86 mg/dL (ref 0.44–1.00)
GFR calc Af Amer: >60 mL/min (ref >60)
GFR calc non Af Amer: >60 mL/min (ref >60)
Glucose, Bld: 111 mg/dL — ABNORMAL HIGH (ref 70–99)
Potassium: 4.1 mmol/L (ref 3.5–5.1)
Sodium: 142 mmol/L (ref 135–145)

## 2019-12-27 LAB — CBC WITH DIFFERENTIAL/PLATELET
Abs Immature Granulocytes: 0.04 10*3/uL (ref 0.00–0.07)
Basophils Absolute: 0 10*3/uL (ref 0.0–0.1)
Basophils Relative: 1 %
Eosinophils Absolute: 0.3 10*3/uL (ref 0.0–0.5)
Eosinophils Relative: 3 %
HCT: 35.6 % — ABNORMAL LOW (ref 36.0–46.0)
Hemoglobin: 10.2 g/dL — ABNORMAL LOW (ref 12.0–15.0)
Immature Granulocytes: 1 %
Lymphocytes Relative: 15 %
Lymphs Abs: 1.3 10*3/uL (ref 0.7–4.0)
MCH: 25.9 pg — ABNORMAL LOW (ref 26.0–34.0)
MCHC: 28.7 g/dL — ABNORMAL LOW (ref 30.0–36.0)
MCV: 90.4 fL (ref 80.0–100.0)
Monocytes Absolute: 0.7 10*3/uL (ref 0.1–1.0)
Monocytes Relative: 8 %
Neutro Abs: 6.2 10*3/uL (ref 1.7–7.7)
Neutrophils Relative %: 72 %
Platelets: 256 10*3/uL (ref 150–400)
RBC: 3.94 MIL/uL (ref 3.87–5.11)
RDW: 15.7 % — ABNORMAL HIGH (ref 11.5–15.5)
WBC: 8.5 10*3/uL (ref 4.0–10.5)
nRBC: 0 % (ref 0.0–0.2)

## 2019-12-27 LAB — TROPONIN I (HIGH SENSITIVITY)
Troponin I (High Sensitivity): 11 ng/L (ref <18)
Troponin I (High Sensitivity): 30 ng/L — ABNORMAL HIGH (ref <18)
Troponin I (High Sensitivity): 24 ng/L — ABNORMAL HIGH (ref <18)

## 2019-12-27 LAB — BRAIN NATRIURETIC PEPTIDE: B Natriuretic Peptide: 33.8 pg/mL (ref 0.0–100.0)

## 2019-12-27 MED ORDER — MOMETASONE FURO-FORMOTEROL FUM 200-5 MCG/ACT IN AERO
2.0000 | INHALATION_SPRAY | Freq: Two times a day (BID) | RESPIRATORY_TRACT | Status: DC
  Administered 2019-12-28: 12:00:00 2 via RESPIRATORY_TRACT
  Filled 2019-12-27: qty 8.8

## 2019-12-27 MED ORDER — CYCLOSPORINE 0.05 % OP EMUL
1.0000 [drp] | Freq: Two times a day (BID) | OPHTHALMIC | Status: DC
  Administered 2019-12-27: 1 [drp] via OPHTHALMIC
  Filled 2019-12-27 (×3): qty 1

## 2019-12-27 MED ORDER — ACETAMINOPHEN 325 MG PO TABS: 650.0000 mg | ORAL_TABLET | Freq: Four times a day (QID) | ORAL | Status: DC | PRN

## 2019-12-27 MED ORDER — UMECLIDINIUM BROMIDE 62.5 MCG/INH IN AEPB
1.0000 | INHALATION_SPRAY | Freq: Every day | RESPIRATORY_TRACT | Status: DC
  Administered 2019-12-28: 12:00:00 1 via RESPIRATORY_TRACT
  Filled 2019-12-27: qty 7

## 2019-12-27 MED ORDER — MIRTAZAPINE 7.5 MG PO TABS
15.0000 mg | ORAL_TABLET | Freq: Every day | ORAL | Status: DC
  Administered 2019-12-27: 22:00:00 15 mg via ORAL
  Filled 2019-12-27: qty 1

## 2019-12-27 MED ORDER — ALBUTEROL SULFATE HFA 108 (90 BASE) MCG/ACT IN AERS
2.0000 | INHALATION_SPRAY | Freq: Once | RESPIRATORY_TRACT | Status: AC
  Administered 2019-12-27: 16:00:00 2 via RESPIRATORY_TRACT
  Filled 2019-12-27: qty 6.7

## 2019-12-27 MED ORDER — ACETAMINOPHEN 650 MG RE SUPP: 650.0000 mg | Freq: Four times a day (QID) | RECTAL | Status: DC | PRN

## 2019-12-27 MED ORDER — IOHEXOL 350 MG/ML SOLN
100.0000 mL | Freq: Once | INTRAVENOUS | Status: AC | PRN
  Administered 2019-12-27: 100 mL via INTRAVENOUS

## 2019-12-27 MED ORDER — ENOXAPARIN SODIUM 40 MG/0.4ML ~~LOC~~ SOLN
40.0000 mg | SUBCUTANEOUS | Status: DC
  Administered 2019-12-27: 22:00:00 40 mg via SUBCUTANEOUS
  Filled 2019-12-27: qty 0.4

## 2019-12-27 MED ORDER — BUDESON-GLYCOPYRROL-FORMOTEROL 160-9-4.8 MCG/ACT IN AERO: 2.0000 | INHALATION_SPRAY | Freq: Two times a day (BID) | RESPIRATORY_TRACT | Status: DC

## 2019-12-27 MED ORDER — ALBUTEROL SULFATE (2.5 MG/3ML) 0.083% IN NEBU: 2.5000 mg | INHALATION_SOLUTION | Freq: Four times a day (QID) | RESPIRATORY_TRACT | Status: DC | PRN

## 2019-12-27 MED ORDER — PREDNISONE 20 MG PO TABS: 40.0000 mg | ORAL_TABLET | Freq: Every day | ORAL | 0 refills | Status: AC

## 2019-12-27 NOTE — H&P (Addendum)
[facility] Admission History and Physical Service Pager: [phone]  Patient name: Mary Jenkins Medical record number: [id number] Date of birth: [birth year] Age: 64 y.o. Gender: female  Primary Care Provider: Matilde Haymaker, MD Consultants: None Code Status: Full Preferred Emergency Contact: Mary Jenkins [phone]  Chief Complaint: Shortness of Breath  Assessment and Plan: Mary Jenkins is a 64 y.o. female presenting with shortness of breath . PMH is significant for Severe Emphysema on Home Oxygen 3 L, GERD,   Shortness of Breath likely secondary to end stage COPD Pt reports sudden onset worsening shortness of breath while sitting.  Prior to this she she reports that she was somewhat nervous and frustrated and attributes this to some cause of her shortness of breath.  She attempted to use her nebulizer but ran out of medication.  She also used her Albuterol inhaler a few times but this did not resolve symptoms. She did not have to increase her home oxygen above her baseline of 3L.   EMS was called and the patient received Magnesium, Steroids and Albuterol therapy on route to the ED.  She was given an additional dose of Albuterol in the ED with some relief.  Chest xray showed hyperinflated lungs and emphysematous changes were once again noted. No acute abnormality. Labs remarkable for WBC 8.5, Hbg 10.2 (baseline 12-13),  Troponins 11-->30-->24, BNP 33.8,Glu 111.  EKG show sinus rhythm unchanged from previous. On exam she appears cachectic and in no acute distress.  Lung sounds clear to auscultation though minimal air movement and prolonged expiratory phase.  She uses accessory muscles to inhale, exhales against pursed lips, but able to talk in complete sentences. She has no BLEE. Considered PE in differential given the acute onset of SOB, but no reports of chest pain, Wells score 0,Geneva Score: low risk group- about 7-9% incidence of PE and CTA chest  shows no evidence of pulmonary embolus.  Also consider PNA but given no fevers, sick contacts, no change in cough from baseline and no leukocytosis less likely bacterial etiology.  Considered COVID PNA but patient reports no decrease in smell or taste, is afebrile and no sick contacts so less likely given negative PCR results. Considered anemia and panic event both as contributing factors as patient reports having some frustration and nervousness prior to SOB and has hgb of 10.2. Though, would not expect significant respiratory distress with mild-mod anemia from baseline. Should also consider arrhythmia as SOB occurred suddenly at rest-- will monitor overnight on tele.   Worsening COPD highest on differential. We will admit for observation overnight, but anticipate discharge tomorrow pending improvement in respiratory status. -Admit Med Tele, Attending Dr. Nori Jenkins -Continuous cardiac and pulse ox monitoring -Maintain O2 sats btw 88-92% -Continue home meds -Vital signs per unit -CBC in am -Considered steroid however no wheezes on exam so will defer for now.  Elevated Troponins Pt reports no chest pain or palpitations during episode of SOB.  EKG show sinus rhythm with no ST changes. No arrhythmias noted on tele since admission. Now, troponin trending down. Most likely secondary to increased oxygen demand.  -Continue cardiac monitoring -Stop trending troponin, now trending down  Anemia, normocytic Patient's hgb 10.2. Previously patient's hgb 12-13. Denies any blood loss and labs benign. Can consider anemia of chronic disease versus iron deficiency. No immediate workup required, will defer work up to outpatient.  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 Previous smoker, quit 07/23/12. She reports dyspnea  on exertion for little activity.  Denies any weight gain or leg edema. She follows with Dr. Melvyn Jenkins, Pulmonology, last seen 03/15.  She was stated on Symbicort 160-4.5 2 puffs  BID, Spiriva q am. Patient reports home medications include Albuterol and Oxygen 3 L -Monitor resp status -Continuous pulse ox monitoring and maintain oxygen sats btwn 88-92% -Continue home medication, confirm with patient re Spiriva and Symbicort in am -Incentive spirometer q2h while awake -Follow up outpatient Pulmonology  Hyperglycemia Serum glucose 111.  No history of diabetes. Last HbA1c 5.8 (2016) Was given steroids by EMS.  Likely secondary to exogenous steroids although physiologic stress may also be contributing factor. -Continue to monitor -Fasting CBG x1 in am  GERD Chronic. Stable -Continue to monitor  HLD Patient reports no history and taking no medication. Last lipid panel (3/21) wnl -Continue to monitor  Weight Loss Pt reports chronic weight loss over the past few years.  She recently started Remeron 15 mg q hs with some improvement.  No loss of smell or taste.  Given the severity of COPD decrease in appetite most likely etiology of weight loss. -Consult nutrition -Continue to monitor -Consider feeding supplements  H/O Breast Cancer Partial right mastectomy 1997.            FEN/GI:  -Heart Healthy -PIV  Prophylaxis:  -Lovenox  Disposition: Cardiac Tele, Attending Dr. Nori Jenkins  History of Present Illness:  Mary Jenkins is a 64 y.o. female presenting with shortness of breath. She is on 3L baseline at home at all times.   Patient reports she was sitting at home watching TV when she suddenly had difficulty breathing.  She went to get her nebulizer but only had small amount of medication left.  She then called 911.  She reports having some frustration over some events that happened earlier today and thinks this may have caused her difficulty breathing.  She reports having similar symptoms when her COPD acts up. She denies any headaches, dizziness, chest pain, pain on inspiration or visual changes.  Denies any fevers, nausea or vomiting, abdominal pain or changes  in urinary or bowel habits.  She does report a cough with minimal phlegm and has had no hemoptysis.  Denies any sick contacts.  She does not feel like she is back to her baseline yet and feels like her symptoms are about a 6/10.    In the ED her vss and afebrile, on 3L oxygen that is her baseline with oxygen sats 100%. She received Mag bolus, albuterol and steroids by EMS and by the time she was evaluated in the ED her symptoms were resolved.  Labs were unremarkable.  Chest xray shows hyperinflated lungs and emphysematous changes were noted again. She received another dose of Albuterol in the ED.  A CTA was ordered in the ED to rule out PE.   Review Of Systems: Per HPI with the following additions:   Review of Systems  Constitutional: Positive for weight loss. Negative for fever.  Respiratory: Positive for cough and shortness of breath. Negative for hemoptysis, sputum production and wheezing.   Cardiovascular: Negative for chest pain, palpitations and leg swelling.  Gastrointestinal: Negative for abdominal pain, blood in stool, constipation, diarrhea, heartburn, nausea and vomiting.  Genitourinary: Negative for dysuria and urgency.  Neurological: Negative for dizziness, loss of consciousness, weakness and headaches.  Psychiatric/Behavioral: The patient is nervous/anxious.     Patient Active Problem List   Diagnosis Date Noted  . Shortness of breath 12/27/2019  . Screening  for hyperlipidemia 12/03/2019  . Bruising 07/21/2019  . COPD exacerbation ([place]) 01/11/2019  . Healthcare maintenance 05/23/2018  . Chronic respiratory failure with hypoxia ([place]) 04/13/2018  . Weight loss 08/02/2017  . COPD with acute exacerbation ([place]) 06/16/2017  . Severe episode of recurrent major depressive disorder, without psychotic features ([place]) 04/25/2017  . Encounter for HCV screening test for low risk patient 03/10/2017  . History of hyperkalemia 02/13/2016  . Skin lesion of back 11/15/2014  . Atypical chest  pain 10/21/2014  . Cataract 10/21/2014  . Dyslipidemia, goal to be determined 04/22/2014  . Lipoma 10/07/2012  . Cervical spine arthritis 08/27/2012  . History of breast cancer 07/23/2012  . GERD (gastroesophageal reflux disease) 02/03/2012  . COPD GOLD IV 06/24/2011  . Liver cyst 06/24/2011  . Insomnia 06/24/2011   Past Medical History: Past Medical History:  Diagnosis Date  . Breast cancer ([place])   . Cataract, immature    bilateral  . COPD (chronic obstructive pulmonary disease) ([place])    no home O2  . Cough 02/09/2015  . DDD (degenerative disc disease), cervical   . Exertional shortness of breath    states if she "takes her time" doing activities does not get SOB  . Family history of adverse reaction to anesthesia    pt's sister has hx. of post-op N/V  . Full dentures   . GERD (gastroesophageal reflux disease) 02/03/2012  . High cholesterol    no current med.  Marland Kitchen History of breast cancer   . Personal history of chemotherapy 1997  . Runny nose 02/09/2015   clear drainage, per pt.   Past Surgical History: Past Surgical History:  Procedure Laterality Date  . BREAST CAPSULOTOMY WITH IMPLANT EXCHANGE Right 06/21/2013   Procedure: REVISION RIGHT BREAST RECONSTRUCTION/REMOVAL OF RIGHT IMPLANT/RIGHT BREAST CAPSULOTOMY WITH INSERT TISSUE EXPLANDER RIGHT BREAST/POSSIBLE LEFT BREAST MASTOPEXY;  Surgeon: Cristine Polio, MD;  Location: [place];  Service: Plastics;  Laterality: Right;  . BREAST RECONSTRUCTION Right 06/21/2013   Procedure: BREAST RECONSTRUCTION;  Surgeon: Cristine Polio, MD;  Location: [place];  Service: Plastics;  Laterality: Right;  . BREAST RECONSTRUCTION Left 11/29/2013   Procedure: LEFT MASTOPEXY FOR RECONSTRUCTION;  Surgeon: Cristine Polio, MD;  Location: [place];  Service: Plastics;  Laterality: Left;  . BREAST RECONSTRUCTION Right 03/14/2014   Procedure: RECONSTRUCTION NIPPLE RIGHT BREAST;  Surgeon: Cristine Polio, MD;  Location: [place];  Service: Plastics;  Laterality: Right;  . BREAST SURGERY  97   implant  . CAPSULOTOMY Right 06/30/2017   Procedure: CAPSULOTOMY;  Surgeon: Cristine Polio, MD;  Location: [place];  Service: Plastics;  Laterality: Right;  . COLONOSCOPY    . MASTECTOMY, PARTIAL Right 1997   -node dissection  . REDUCTION MAMMAPLASTY Left   . REMOVAL OF TISSUE EXPANDER AND PLACEMENT OF IMPLANT Right 02/13/2015   Procedure: REMOVAL OF TISSUE  EXPANDER  PORT RIGHT BREAST;  Surgeon: Cristine Polio, MD;  Location: [place];  Service: Plastics;  Laterality: Right;  . REMOVAL OF TISSUE EXPANDER AND PLACEMENT OF IMPLANT Right 06/30/2017   Procedure: REMOVAL OF RIGHT TISSUE EXPANDER WITH PLACEMENT OF RIGHT GELL BREAST IMPLANTS;  Surgeon: Cristine Polio, MD;  Location: [place];  Service: Plastics;  Laterality: Right;  . SCAR REVISION Right 02/13/2015   Procedure: SCAR REVISION RIGHT BREAST;  Surgeon: Cristine Polio, MD;  Location: [place];  Service: Plastics;  Laterality: Right;  . TISSUE EXPANDER PLACEMENT Right  03/14/2014   Procedure: SALINE REMOVAL RIGHT TISSUE EXPANDER;  Surgeon: Cristine Polio, MD;  Location: [place];  Service: Plastics;  Laterality: Right;  . TUBAL LIGATION  1980's    Social History: Social History   Tobacco Use  . Smoking status: Former Smoker    Quit date: 07/23/2012    Years since quitting: 7.4  . Smokeless tobacco: Never Used  Substance Use Topics  . Alcohol use: No    Alcohol/week: 0.0 standard drinks    Comment: one beer every now and then  . Drug use: No   Additional social history: Live by herself, daughter reports that she wants mother to live with her after discharge.   Family History: Family History  Problem Relation Age of Onset  . Stroke Mother   . Heart disease Brother   . Heart disease Brother   . Anesthesia problems  Sister        post-op N/V  . Cancer Sister        Endometrial CA  . Heart disease Brother   . Tuberculosis Daughter        not active  . Allergic rhinitis Daughter   . Allergic rhinitis Son   . Hypertension Sister   . Diabetes Sister   . Breast cancer Neg Hx     Allergies and Medications: No Known Allergies No current facility-administered medications on file prior to encounter.   Current Outpatient Medications on File Prior to Encounter  Medication Sig Dispense Refill  . albuterol (PROAIR HFA) 108 (90 Base) MCG/ACT inhaler Inhale 2 puffs into the lungs every 6 (six) hours as needed for wheezing or shortness of breath. 18 g 3  . albuterol (PROVENTIL) (2.5 MG/3ML) 0.083% nebulizer solution USE 3 ML IN NEBULIZER EVERY 6 HOURS AS NEEDED FOR  WHEEZING  OR  SHORTNESS  OF  BREATH (Patient taking differently: Take 2.5 mg by nebulization every 6 (six) hours as needed for wheezing or shortness of breath. ) 150 mL 1  . Budeson-Glycopyrrol-Formoterol (BREZTRI AEROSPHERE) 160-9-4.8 MCG/ACT AERO Inhale 2 puffs into the lungs 2 (two) times daily. 10.7 g 11  . cycloSPORINE (RESTASIS) 0.05 % ophthalmic emulsion 1 drop 2 (two) times daily.    . mirtazapine (REMERON) 15 MG tablet Take 1 tablet (15 mg total) by mouth at bedtime. 60 tablet 2  . OXYGEN Place 2-3 L into the nose continuous.     . valACYclovir (VALTREX) 1000 MG tablet TAKE 1 TABLET BY MOUTH TWICE DAILY FOR 3 DAYS AS NEEDED (Patient taking differently: Take 1,000 mg by mouth 2 (two) times daily as needed (for flare up). ) 30 tablet 5    Objective: BP 111/75   Pulse 84   Temp 98.1 F (36.7 C) (Oral)   Resp (!) 21   SpO2 100%  Exam: General: 64 y.o female cachetic appearing and in mild respiratory distress ENTM: mucus membranes moist Neck: no elevated JVD appreciated Cardiovascular: RRR, no murmurs or gallops appreciated Respiratory: CTAB, diminished at the bases, no wheezing or crackles appreciated.   Gastrointestinal: soft, non  tender, non distended. BS present Extremities: moving all extremities, no lower extremity edema. Pulses present Derm: no rashes or ecchymosis noted Neuro: Alert and oriented x3  Labs and Imaging: CBC BMET  Recent Labs  Lab 12/27/19 1530  WBC 8.5  HGB 10.2*  HCT 35.6*  PLT 256   Recent Labs  Lab 12/27/19 1530  NA 142  K 4.1  CL 104  CO2 28  BUN 11  CREATININE 0.86  GLUCOSE 111*  CALCIUM 8.5*     EKG: NSR  CT Angio Chest PE W and/or Wo Contrast  Result Date: 12/27/2019 CLINICAL DATA:  64 year old female with shortness of breath and elevated troponin. Evaluate for pulmonary embolism. History of breast cancer.s. IMPRESSION: 1. No acute intrathoracic pathology. No CT evidence of pulmonary embolism. 2. Severe emphysema. 3. Aortic Atherosclerosis (ICD10-I70.0) and Emphysema (ICD10-J43.9). Electronically Signed   By: Anner Crete M.D.   On: 12/27/2019 20:04   DG Chest Portable 1 View  Result Date: 12/27/2019 CLINICAL DATA:  Sudden onset of shortness of breath. History of COPD and CHF. EXAM: PORTABLE CHEST 1 VIEW COMPARISON: IMPRESSION: No evidence for acute abnormality. Electronically Signed   By: Nolon Nations M.D.   On: 12/27/2019 15:58    Carollee Leitz, MD 12/27/2019, 7:21 PM PGY-1, [facility] Intern pager: [phone], text pages welcome  FPTS Upper-Level Resident Addendum I have independently interviewed and examined the patient. I have discussed the above with the original author and agree with their documentation. My edits for correction/addition/clarification are in - purple. Please see also any attending notes.  [facility] pager: [phone] (text pages welcome through [facility])  Wilber Oliphant, M.D.  PGY-2 12/28/2019 12:25 AM

## 2019-12-27 NOTE — ED Triage Notes (Signed)
Pt from home via EMS when she developed sudden onset of SOB. Hx COPD/CHF and is normally on 3L [place]. O2 sats at 92% on 3L on EMS arrival. EMS gave 7m albuterol, 2g magnesium, 1272msolumedrol. Pt states she is feeling much better, back to normal.

## 2019-12-27 NOTE — ED Provider Notes (Signed)
[facility] EMERGENCY DEPARTMENT Provider Note   CSN: [id number] Arrival date & time: 12/27/19  1459     History Chief Complaint  Patient presents with  . Shortness of Breath    Mary Jenkins is a 64 y.o. female.  Presented to ER with complaint of shortness of breath.  Reports that she was at home when she suddenly felt short of breath, felt similar to prior issues with COPD.  Reports she has been having mild cough, nonproductive, nonbloody.  No associated fever.  Had no associated chest pain.  States that she was not able to refill her nebulizer machine in time to get a treatment this afternoon.  Once she received nebulizer treatment, magnesium, steroids by EMS, her symptoms had completely resolved and she feels back to her baseline respiratory status.  Chronically 3 L nasal cannula.  HPI     Past Medical History:  Diagnosis Date  . Breast cancer ([place])   . Cataract, immature    bilateral  . COPD (chronic obstructive pulmonary disease) ([place])    no home O2  . Cough 02/09/2015  . DDD (degenerative disc disease), cervical   . Exertional shortness of breath    states if she "takes her time" doing activities does not get SOB  . Family history of adverse reaction to anesthesia    pt's sister has hx. of post-op N/V  . Full dentures   . GERD (gastroesophageal reflux disease) 02/03/2012  . High cholesterol    no current med.  Marland Kitchen History of breast cancer   . Personal history of chemotherapy 1997  . Runny nose 02/09/2015   clear drainage, per pt.    Patient Active Problem List   Diagnosis Date Noted  . Screening for hyperlipidemia 12/03/2019  . Bruising 07/21/2019  . COPD exacerbation ([place]) 01/11/2019  . Healthcare maintenance 05/23/2018  . Chronic respiratory failure with hypoxia ([place]) 04/13/2018  . Weight loss 08/02/2017  . COPD with acute exacerbation ([place]) 06/16/2017  . Severe episode of recurrent major depressive disorder, without psychotic features  ([place]) 04/25/2017  . Encounter for HCV screening test for low risk patient 03/10/2017  . History of hyperkalemia 02/13/2016  . Skin lesion of back 11/15/2014  . Atypical chest pain 10/21/2014  . Cataract 10/21/2014  . Dyslipidemia, goal to be determined 04/22/2014  . Lipoma 10/07/2012  . Cervical spine arthritis 08/27/2012  . History of breast cancer 07/23/2012  . GERD (gastroesophageal reflux disease) 02/03/2012  . COPD GOLD IV 06/24/2011  . Liver cyst 06/24/2011  . Insomnia 06/24/2011    Past Surgical History:  Procedure Laterality Date  . BREAST CAPSULOTOMY WITH IMPLANT EXCHANGE Right 06/21/2013   Procedure: REVISION RIGHT BREAST RECONSTRUCTION/REMOVAL OF RIGHT IMPLANT/RIGHT BREAST CAPSULOTOMY WITH INSERT TISSUE EXPLANDER RIGHT BREAST/POSSIBLE LEFT BREAST MASTOPEXY;  Surgeon: Cristine Polio, MD;  Location: [facility];  Service: Plastics;  Laterality: Right;  . BREAST RECONSTRUCTION Right 06/21/2013   Procedure: BREAST RECONSTRUCTION;  Surgeon: Cristine Polio, MD;  Location: [facility];  Service: Plastics;  Laterality: Right;  . BREAST RECONSTRUCTION Left 11/29/2013   Procedure: LEFT MASTOPEXY FOR RECONSTRUCTION;  Surgeon: Cristine Polio, MD;  Location: [facility];  Service: Plastics;  Laterality: Left;  . BREAST RECONSTRUCTION Right 03/14/2014   Procedure: RECONSTRUCTION NIPPLE RIGHT BREAST;  Surgeon: Cristine Polio, MD;  Location: [place];  Service: Plastics;  Laterality: Right;  . BREAST SURGERY  97   implant  . CAPSULOTOMY Right 06/30/2017   Procedure:  CAPSULOTOMY;  Surgeon: Cristine Polio, MD;  Location: [place];  Service: Plastics;  Laterality: Right;  . COLONOSCOPY    . MASTECTOMY, PARTIAL Right 1997   -node dissection  . REDUCTION MAMMAPLASTY Left   . REMOVAL OF TISSUE EXPANDER AND PLACEMENT OF IMPLANT Right 02/13/2015   Procedure: REMOVAL OF TISSUE  EXPANDER  PORT RIGHT BREAST;  Surgeon:  Cristine Polio, MD;  Location: [facility];  Service: Plastics;  Laterality: Right;  . REMOVAL OF TISSUE EXPANDER AND PLACEMENT OF IMPLANT Right 06/30/2017   Procedure: REMOVAL OF RIGHT TISSUE EXPANDER WITH PLACEMENT OF RIGHT GELL BREAST IMPLANTS;  Surgeon: Cristine Polio, MD;  Location: [facility];  Service: Plastics;  Laterality: Right;  . SCAR REVISION Right 02/13/2015   Procedure: SCAR REVISION RIGHT BREAST;  Surgeon: Cristine Polio, MD;  Location: [facility];  Service: Plastics;  Laterality: Right;  . TISSUE EXPANDER PLACEMENT Right 03/14/2014   Procedure: SALINE REMOVAL RIGHT TISSUE EXPANDER;  Surgeon: Cristine Polio, MD;  Location: [facility];  Service: Plastics;  Laterality: Right;  . TUBAL LIGATION  1980's     OB History   No obstetric history on file.     Family History  Problem Relation Age of Onset  . Stroke Mother   . Heart disease Brother   . Heart disease Brother   . Anesthesia problems Sister        post-op N/V  . Cancer Sister        Endometrial CA  . Heart disease Brother   . Tuberculosis Daughter        not active  . Allergic rhinitis Daughter   . Allergic rhinitis Son   . Hypertension Sister   . Diabetes Sister   . Breast cancer Neg Hx     Social History   Tobacco Use  . Smoking status: Former Smoker    Quit date: 07/23/2012    Years since quitting: 7.4  . Smokeless tobacco: Never Used  Substance Use Topics  . Alcohol use: No    Alcohol/week: 0.0 standard drinks    Comment: one beer every now and then  . Drug use: No    Home Medications Prior to Admission medications   Medication Sig Start Date End Date Taking? Authorizing Provider  albuterol (PROAIR HFA) 108 (90 Base) MCG/ACT inhaler Inhale 2 puffs into the lungs every 6 (six) hours as needed for wheezing or shortness of breath. 07/21/19  Yes Bartholomew Crews, MD  albuterol (PROVENTIL) (2.5 MG/3ML) 0.083% nebulizer solution  USE 3 ML IN NEBULIZER EVERY 6 HOURS AS NEEDED FOR  WHEEZING  OR  SHORTNESS  OF  BREATH Patient taking differently: Take 2.5 mg by nebulization every 6 (six) hours as needed for wheezing or shortness of breath.  06/14/19  Yes Tanda Rockers, MD  Budeson-Glycopyrrol-Formoterol (BREZTRI AEROSPHERE) 160-9-4.8 MCG/ACT AERO Inhale 2 puffs into the lungs 2 (two) times daily. 10/25/19  Yes Tanda Rockers, MD  cycloSPORINE (RESTASIS) 0.05 % ophthalmic emulsion 1 drop 2 (two) times daily.   Yes [provider]  mirtazapine (REMERON) 15 MG tablet Take 1 tablet (15 mg total) by mouth at bedtime. 12/03/19  Yes Matilde Haymaker, MD  OXYGEN Place 2-3 L into the nose continuous.    Yes [provider]  valACYclovir (VALTREX) 1000 MG tablet TAKE 1 TABLET BY MOUTH TWICE DAILY FOR 3 DAYS AS NEEDED Patient taking differently: Take 1,000 mg by mouth 2 (two) times daily as needed (  for flare up).  05/24/19  Yes Shelly Bombard, MD    Allergies    Patient has no known allergies.  Review of Systems   Review of Systems  Constitutional: Negative for chills and fever.  HENT: Negative for ear pain and sore throat.   Eyes: Negative for pain and visual disturbance.  Respiratory: Positive for cough and shortness of breath.   Cardiovascular: Negative for chest pain and palpitations.  Gastrointestinal: Negative for abdominal pain and vomiting.  Genitourinary: Negative for dysuria and hematuria.  Musculoskeletal: Negative for arthralgias and back pain.  Skin: Negative for color change and rash.  Neurological: Negative for seizures and syncope.  All other systems reviewed and are negative.   Physical Exam Updated Vital Signs BP 98/71 (BP Location: Right Arm)   Pulse 87   Temp 98.1 F (36.7 C) (Oral)   Resp (!) 21   SpO2 100%   Physical Exam Vitals and nursing note reviewed.  Constitutional:      General: She is not in acute distress.    Appearance: She is well-developed.  HENT:     Head:  Normocephalic and atraumatic.  Eyes:     Conjunctiva/sclera: Conjunctivae normal.  Cardiovascular:     Rate and Rhythm: Normal rate and regular rhythm.     Heart sounds: No murmur.  Pulmonary:     Effort: Pulmonary effort is normal. No respiratory distress.     Comments: Very faint expiratory wheeze noted bilaterally, speaks in full sentences without difficulty Chest:     Chest wall: No tenderness or crepitus.  Abdominal:     Palpations: Abdomen is soft.     Tenderness: There is no abdominal tenderness.  Musculoskeletal:     Cervical back: Neck supple.  Skin:    General: Skin is warm and dry.  Neurological:     General: No focal deficit present.     Mental Status: She is alert.  Psychiatric:        Mood and Affect: Mood normal.     ED Results / Procedures / Treatments   Labs (all labs ordered are listed, but only abnormal results are displayed) Labs Reviewed  CBC WITH DIFFERENTIAL/PLATELET  BASIC METABOLIC PANEL  BRAIN NATRIURETIC PEPTIDE  TROPONIN I (HIGH SENSITIVITY)    EKG EKG Interpretation  Date/Time:  Monday December 27 2019 15:14:19 EDT Ventricular Rate:  87 PR Interval:    QRS Duration: 72 QT Interval:  370 QTC Calculation: 446 R Axis:   88 Text Interpretation: Sinus rhythm Borderline right axis deviation Borderline T wave abnormalities Confirmed by Madalyn Rob [phone]) on 12/27/2019 3:24:44 PM   Radiology No results found.  Procedures Procedures (including critical care time)  Medications Ordered in ED Medications  albuterol (VENTOLIN HFA) 108 (90 Base) MCG/ACT inhaler 2 puff (has no administration in time range)    ED Course  I have reviewed the triage vital signs and the nursing notes.  Pertinent labs & imaging results that were available during my care of the patient were reviewed by me and considered in my medical decision making (see chart for details).    MDM Rules/Calculators/A&P                     64 year old lady with notable  history for COPD on 3 L nasal cannula chronically presenting to ER after having episode of shortness of breath.  By the time she arrived in ER her symptoms have completely resolved.  Lungs were mostly clear, no respiratory distress,  no increased work of breathing.  Will check CXR to evaluate for infiltrate, basic labs, give another dose of albuterol and reassess.  While awaiting work-up and reassessment, patient signed will be signed out to Dr. Langston Masker.  Anticipate discharge likely.  Final Clinical Impression(s) / ED Diagnoses Final diagnoses:  COPD exacerbation [facility])    Rx / DC Orders ED Discharge Orders    None       Lucrezia Starch, MD 12/27/19 1555

## 2019-12-27 NOTE — ED Provider Notes (Signed)
Clinical Course as of Dec 26 1928  Mon Dec 27, 2019  1605 Pt signed out to me by Dr Reita May.  Briefly 64 yo female w/ hx of COPD on 3L [place] baseline presenting with SOB, improved after nebulizers and magnesium and solumedrol by EMS, feels back to baseline.  Pending labs, anticipate discharge if resp status stable.   [MT]    Clinical Course User Index [MT] Wyvonnia Dusky, MD    Repeat troponin showed elevation to 30.  She remains asymptomatic and free of chest pain.  However the acuity of her symptom presentation suggests the possibility of PE or arrhythmia (tachydysrhymia?) earlier today, as it occurred while she was at rest on the couch.  Given this fact, I ordered a CT PE and called for admission.  I do NOT see evidence of massive PE at this time, and I think the dangers of initiating heparin without a confirmed diagnosis outweigh the possible benefits.  If there is any significant clinical deterioration, I will reassess this immediately.   Signout given to family practice service.     Wyvonnia Dusky, MD 12/27/19 [phone]

## 2019-12-28 ENCOUNTER — Other Ambulatory Visit: Payer: Self-pay

## 2019-12-28 DIAGNOSIS — J439 Emphysema, unspecified: Secondary | ICD-10-CM | POA: Diagnosis not present

## 2019-12-28 DIAGNOSIS — J441 Chronic obstructive pulmonary disease with (acute) exacerbation: Secondary | ICD-10-CM | POA: Diagnosis not present

## 2019-12-28 DIAGNOSIS — J9611 Chronic respiratory failure with hypoxia: Secondary | ICD-10-CM | POA: Diagnosis not present

## 2019-12-28 DIAGNOSIS — R0602 Shortness of breath: Secondary | ICD-10-CM | POA: Diagnosis not present

## 2019-12-28 LAB — CBC
HCT: 33.7 % — ABNORMAL LOW (ref 36.0–46.0)
Hemoglobin: 10.3 g/dL — ABNORMAL LOW (ref 12.0–15.0)
MCH: 26.3 pg (ref 26.0–34.0)
MCHC: 30.6 g/dL (ref 30.0–36.0)
MCV: 86 fL (ref 80.0–100.0)
Platelets: 268 10*3/uL (ref 150–400)
RBC: 3.92 MIL/uL (ref 3.87–5.11)
RDW: 15.7 % — ABNORMAL HIGH (ref 11.5–15.5)
WBC: 5.5 10*3/uL (ref 4.0–10.5)
nRBC: 0 % (ref 0.0–0.2)

## 2019-12-28 LAB — MRSA PCR SCREENING: MRSA by PCR: NEGATIVE

## 2019-12-28 LAB — SARS CORONAVIRUS 2 (TAT 6-24 HRS): SARS Coronavirus 2: NEGATIVE

## 2019-12-28 LAB — GLUCOSE, CAPILLARY: Glucose-Capillary: 123 mg/dL — ABNORMAL HIGH (ref 70–99)

## 2019-12-28 MED ORDER — BREZTRI AEROSPHERE 160-9-4.8 MCG/ACT IN AERO: 2.0000 | INHALATION_SPRAY | Freq: Two times a day (BID) | RESPIRATORY_TRACT | 0 refills | Status: DC

## 2019-12-28 MED ORDER — BREZTRI AEROSPHERE 160-9-4.8 MCG/ACT IN AERO: 2.0000 | INHALATION_SPRAY | Freq: Two times a day (BID) | RESPIRATORY_TRACT | 0 refills | Status: AC

## 2019-12-28 NOTE — Progress Notes (Signed)
Patient provided with verbal discharge instructions.  RN answered all questions. Copy of discharge summary provided to patient. Pt daughter at bedside during d/c. IV's removed. VSS at discharge. Pt belongings sent with patient. NT discharged patient via wheelchair to private vehicle.

## 2019-12-28 NOTE — Evaluation (Signed)
Occupational Therapy Evaluation Patient Details Name: Mary Jenkins MRN: [id number] DOB: 18-Apr-1956 Today's Date: 12/28/2019    History of Present Illness Pt is a 64 yo female admitted with SOB history of end stage COPD on 3L home O2.  Work up to rule out MI/PE underway.   Clinical Impression   Pt PTA: Living at home alone with assist from daughter. Pt reports performing own ADL and mobility in small apartment mostly seated for ADL tasks. Pt showers weekly with daughter, otherwise sink bathes. Pt currently performing mobility with no AD and short distances 4' before requiring seated rest break. Pt appears at her functional baseline. O2 >94% on 3L O2 with exertion. HR increasing 114 BPM. Pt has shower chair and already aware of energy conservation techniques. No further acute OT needs required.  OT signing off. Pt would benefit from [facility] for energy conservation and pt reports already having HHPT.    Follow Up Recommendations  Home health OT    Equipment Recommendations  None recommended by OT    Recommendations for Other Services       Precautions / Restrictions Precautions Precautions: Fall Precaution Comments: O2 Restrictions Weight Bearing Restrictions: No      Mobility Bed Mobility Overal bed mobility: Modified Independent                Transfers Overall transfer level: Modified independent                    Balance Overall balance assessment: No apparent balance deficits (not formally assessed)                                         ADL either performed or assessed with clinical judgement   ADL Overall ADL's : At baseline                                       General ADL Comments: Pt performing ADL tasks with seated rest breaks and SOB noted. No physical assist required; increased time required. Pt's O2 remained >93% on 2-3LO2 [place].  Pt ambulatory in room without AD, but fatigues very quickly. Pt reports her  apartment is small and she appreciates that bedroom and bathroom are very close together.     Vision Baseline Vision/History: No visual deficits Patient Visual Report: No change from baseline Vision Assessment?: No apparent visual deficits     Perception     Praxis      Pertinent Vitals/Pain Pain Assessment: No/denies pain     Hand Dominance Right   Extremity/Trunk Assessment Upper Extremity Assessment Upper Extremity Assessment: Defer to OT evaluation   Lower Extremity Assessment Lower Extremity Assessment: Generalized weakness   Cervical / Trunk Assessment Cervical / Trunk Assessment: Normal   Communication Communication Communication: No difficulties   Cognition Arousal/Alertness: Awake/alert Behavior During Therapy: WFL for tasks assessed/performed Overall Cognitive Status: Within Functional Limits for tasks assessed                                     General Comments  O2 >94% on 3L O2 with exertion. HR increasing 114 BPM    Exercises     Shoulder Instructions      Home Living Family/patient  expects to be discharged to:: Private residence Living Arrangements: Alone Available Help at Discharge: Family;Available PRN/intermittently Type of Home: Apartment Home Access: Level entry     Home Layout: One level     Bathroom Shower/Tub: Teacher, early years/pre: Handicapped height     Home Equipment: Cane - single point;Shower seat          Prior Functioning/Environment Level of Independence: Independent with assistive device(s)        Comments: Driving, performing own ADL/IADL; sister was assisting with grocery shopping        OT Problem List: Decreased activity tolerance      OT Treatment/Interventions:      OT Goals(Current goals can be found in the care plan section) Acute Rehab OT Goals Patient Stated Goal: to go home with help of daughter OT Goal Formulation: With patient  OT Frequency:     Barriers to D/C:             Co-evaluation              AM-PAC OT "6 Clicks" Daily Activity     Outcome Measure Help from another person eating meals?: None Help from another person taking care of personal grooming?: None Help from another person toileting, which includes using toliet, bedpan, or urinal?: None Help from another person bathing (including washing, rinsing, drying)?: A Little Help from another person to put on and taking off regular upper body clothing?: None Help from another person to put on and taking off regular lower body clothing?: None 6 Click Score: 23   End of Session Equipment Utilized During Treatment: Oxygen Nurse Communication: Mobility status  Activity Tolerance: Patient tolerated treatment well Patient left: in bed;with call bell/phone within reach  OT Visit Diagnosis: Unsteadiness on feet (R26.81)                Time: 1071-2524 OT Time Calculation (min): 22 min Charges:  OT General Charges $OT Visit: 1 Visit OT Evaluation $OT Eval Moderate Complexity: 1 Mod  Jefferey Pica, OTR/L Acute Rehabilitation Services Pager: [phone] Office: [phone]   Geanine Vandekamp C 12/28/2019, 9:46 AM

## 2019-12-28 NOTE — Evaluation (Signed)
Physical Therapy Evaluation Patient Details Name: Mary Jenkins MRN: [id number] DOB: [birth year] Today's Date: 12/28/2019   History of Present Illness  Pt is a 64 yo female admitted with SOB history of end stage COPD on 3L home O2.  Work up to rule out MI/PE underway.  Clinical Impression  Patient presents with mobility close to baseline.  Reports SOB much improved and hopeful for home later today.  Reports would like help to get approval for daughter to stay with her in housing to provide some assistance.  Will refer to Education officer, museum.  PT needs can be met in [facility] setting as noted below and with planned d/c will sign off.     Follow Up Recommendations Home health PT(already set up and recieving HHPT for pulmonary rehab)    Equipment Recommendations  None recommended by PT    Recommendations for Other Services       Precautions / Restrictions Precautions Precautions: Fall Precaution Comments: O2 Restrictions Weight Bearing Restrictions: No      Mobility  Bed Mobility Overal bed mobility: Modified Independent                Transfers Overall transfer level: Modified independent                  Ambulation/Gait Ambulation/Gait assistance: Independent Gait Distance (Feet): 210 Feet Assistive device: None Gait Pattern/deviations: Step-through pattern;Decreased stride length;Decreased dorsiflexion - right     General Gait Details: mild shuffling, no LOB, slow pace appropriate for energy conservation  Stairs            Wheelchair Mobility    Modified Rankin (Stroke Patients Only)       Balance Overall balance assessment: No apparent balance deficits (not formally assessed)                                           Pertinent Vitals/Pain Pain Assessment: No/denies pain    Home Living Family/patient expects to be discharged to:: Private residence Living Arrangements: Alone Available Help at Discharge: Family;Available  PRN/intermittently Type of Home: Apartment Home Access: Level entry     Home Layout: One level Home Equipment: Cane - single point;Shower seat      Prior Function Level of Independence: Independent with assistive device(s)         Comments: Driving, performing own ADL/IADL; sister was assisting with grocery shopping     Hand Dominance   Dominant Hand: Right    Extremity/Trunk Assessment   Upper Extremity Assessment Upper Extremity Assessment: Defer to OT evaluation    Lower Extremity Assessment Lower Extremity Assessment: Generalized weakness    Cervical / Trunk Assessment Cervical / Trunk Assessment: Normal  Communication   Communication: No difficulties  Cognition Arousal/Alertness: Awake/alert Behavior During Therapy: WFL for tasks assessed/performed Overall Cognitive Status: Within Functional Limits for tasks assessed                                        General Comments General comments (skin integrity, edema, etc.): SpO2 98% on 3L throughout session, HR max 118    Exercises     Assessment/Plan    PT Assessment All further PT needs can be met in the next venue of care  PT Problem List  PT Treatment Interventions      PT Goals (Current goals can be found in the Care Plan section)  Acute Rehab PT Goals Patient Stated Goal: to go home with help of daughter PT Goal Formulation: All assessment and education complete, DC therapy    Frequency     Barriers to discharge        Co-evaluation               AM-PAC PT "6 Clicks" Mobility  Outcome Measure Help needed turning from your back to your side while in a flat bed without using bedrails?: None Help needed moving from lying on your back to sitting on the side of a flat bed without using bedrails?: None Help needed moving to and from a bed to a chair (including a wheelchair)?: None Help needed standing up from a chair using your arms (e.g., wheelchair or bedside  chair)?: None Help needed to walk in hospital room?: None Help needed climbing 3-5 steps with a railing? : A Little 6 Click Score: 23    End of Session Equipment Utilized During Treatment: Oxygen Activity Tolerance: Patient tolerated treatment well Patient left: in bed;with call bell/phone within reach   PT Visit Diagnosis: Muscle weakness (generalized) (M62.81);Difficulty in walking, not elsewhere classified (R26.2)    Time: 3014-9969 PT Time Calculation (min) (ACUTE ONLY): 14 min   Charges:   PT Evaluation $PT Eval Low Complexity: [place], [place] Acute Rehabilitation Services [phone] 12/28/2019   Reginia Naas 12/28/2019, 9:48 AM

## 2019-12-28 NOTE — Discharge Instructions (Signed)
Take steroids for suspected COPD flare.  Take your home nebulized treatments as needed.  Return to ER for recurrent difficulty breathing, chest pain, other new concerning symptom.  We have placed a referral to cardiology for further evaluation of your worsening shortness of breath.  Please be sure to follow-up as scheduled.  They should give you a call within 1 to 2 weeks to schedule this appointment.  Please be sure to also follow-up with your PCP on 12/30/2019 at 2:40 PM to ensure your symptoms are still doing well.  If you have any worsening symptoms please return to the ED for further evaluation sooner.

## 2019-12-28 NOTE — Hospital Course (Addendum)
Mary Jenkins is a 64 y.o. female presenting with shortness of breath . PMH is significant for Severe Emphysema on Home Oxygen 3 L, GERD. Her hospital course is outlined below.    Shortness of Breath likely secondary to end stage COPD Patient presented with worsening shortness of breath. She had received Magnesium, Steroids and Albuterol via EMS prior to ED. She received an additional dose of Albuterol in the ED with some improvement of symptoms.  She continued to remain on 3L oxygen which is her baseline.  Chest xray negative for acute changes.  Labs remarkable for mild elevation to Troponin and ECG showed NSR without ST changes. Given the acute onset of SOB and mild elevation in Troponin a CTA chest was obtained and was negative for PE.  Wells score 0. Troponin continued to trend down and patient remained pain free. She continued to improved and was hemodynamically stable for discharge on home oxygen.

## 2019-12-28 NOTE — Discharge Summary (Signed)
[facility] Discharge Summary  Patient name: Mary Jenkins Medical record number: [id number] Date of birth: 03/21/1956 Age: 64 y.o. Gender: female Date of Admission: 12/27/2019  Date of Discharge: 12/28/2019 Admitting Physician: Carollee Leitz, MD  Primary Care Provider: Matilde Haymaker, MD Consultants: N/A  Indication for Hospitalization: Dyspnea on exertion  Discharge Diagnoses/Problem List:   Dyspnea COPD-GOLD IV Elevated troponin Anemia, normocytic Hyperglycemia GERD HLD Weight loss Hx of Breast Cancer  Disposition: Home  Discharge Condition: Stable  Discharge Exam:   General: Appears well, no acute distress. Age appropriate. Cardiac: RRR, normal heart sounds, no murmurs Respiratory: CTAB, normal effort. No wheezing or crackles. Extremities: No edema or cyanosis. Skin: Warm and dry, no rashes noted Neuro: alert and oriented x3 Psych: normal affect  Brief Hospital Course:  Mary Jenkins is a 64 y.o. female presenting with shortness of breath . PMH is significant for Severe Emphysema on Home Oxygen 3 L, GERD. Her hospital course is outlined below.    Shortness of Breath likely secondary to end stage COPD Patient presented with worsening shortness of breath. She had received Magnesium, Steroids and Albuterol via EMS prior to ED. She received an additional dose of Albuterol in the ED with some improvement of symptoms.  She continued to remain on 3L oxygen which is her baseline.  Chest xray negative for acute changes.  Labs remarkable for mild elevation to Troponin and ECG showed NSR without ST changes. Given the acute onset of SOB and mild elevation in Troponin a CTA chest was obtained and was negative for PE.  Wells score 0. Troponin continued to trend down and patient remained pain free. She continued to improved and was hemodynamically stable for discharge on home oxygen.     Issues for Follow Up:  1. Continue steroids for suspected  COPD exacerbation x 5 days 2. Needs to follow up with Cardiology; referral placed. 3. Ensure symptom improvement  Significant Procedures: N/A  Significant Labs and Imaging:  Recent Labs  Lab 12/27/19 1530 12/28/19 0237  WBC 8.5 5.5  HGB 10.2* 10.3*  HCT 35.6* 33.7*  PLT 256 268   Recent Labs  Lab 12/27/19 1530  NA 142  K 4.1  CL 104  CO2 28  GLUCOSE 111*  BUN 11  CREATININE 0.86  CALCIUM 8.5*    PORTABLE CHEST 1 VIEW COMPARISON:  01/11/2019 IMPRESSION: No evidence for acute abnormality.  CT ANGIOGRAPHY CHEST WITH CONTRAST COMPARISON:  Chest CT dated 02/01/2018. IMPRESSION: 1. No acute intrathoracic pathology. No CT evidence of pulmonary embolism. 2. Severe emphysema. 3. Aortic Atherosclerosis (ICD10-I70.0) and Emphysema (ICD10-J43.9).   Results/Tests Pending at Time of Discharge: N/A  Discharge Medications:  Allergies as of 12/28/2019   No Known Allergies     Medication List    TAKE these medications   albuterol (2.5 MG/3ML) 0.083% nebulizer solution Commonly known as: PROVENTIL USE 3 ML IN NEBULIZER EVERY 6 HOURS AS NEEDED FOR  WHEEZING  OR  SHORTNESS  OF  BREATH What changed: See the new instructions.   albuterol 108 (90 Base) MCG/ACT inhaler Commonly known as: ProAir HFA Inhale 2 puffs into the lungs every 6 (six) hours as needed for wheezing or shortness of breath. What changed: Another medication with the same name was changed. Make sure you understand how and when to take each.   Breztri Aerosphere 160-9-4.8 MCG/ACT Aero Generic drug: Budeson-Glycopyrrol-Formoterol Inhale 2 puffs into the lungs 2 (two) times daily.   cycloSPORINE 0.05 % ophthalmic emulsion Commonly known  as: RESTASIS 1 drop 2 (two) times daily.   mirtazapine 15 MG tablet Commonly known as: Remeron Take 1 tablet (15 mg total) by mouth at bedtime.   OXYGEN Place 2-3 L into the nose continuous.   predniSONE 20 MG tablet Commonly known as: DELTASONE Take 2 tablets (40  mg total) by mouth daily for 5 days.   valACYclovir 1000 MG tablet Commonly known as: VALTREX TAKE 1 TABLET BY MOUTH TWICE DAILY FOR 3 DAYS AS NEEDED What changed:   how much to take  how to take this  when to take this  reasons to take this  additional instructions       Discharge Instructions: Please refer to Patient Instructions section of EMR for full details.  Patient was counseled important signs and symptoms that should prompt return to medical care, changes in medications, dietary instructions, activity restrictions, and follow up appointments.   Follow-Up Appointments: Follow-up Information    Go to  [facility].   Specialty: Emergency Medicine Why: As needed Contact information: [address] [id number] [place]. Go on 12/30/2019.   Why: at 2:40PM for hospital follow-up Contact information: [place]       Cardiology Follow up.   Why: Expect a call from the cardiology clinic to schedule a follow-up appointment       [facility] Follow up.   Specialty: Home Health Services Why: HHPT, HHOT Contact information: [place] [address] [phone]           Gerlene Fee, DO 12/28/2019, 5:05 PM PGY-1, [place]

## 2019-12-28 NOTE — TOC Transition Note (Signed)
Transition of Care [facility]) - CM/SW Discharge Note   Patient Details  Name: Mary Jenkins MRN: [id number] Date of Birth: 1956/08/24  Transition of Care [facility]) CM/SW Contact:  Zenon Mayo, RN Phone Number: 12/28/2019, 11:15 AM   Clinical Narrative:    Patient for dc today, NCM spoke with patient, she states she is active with [facility] for HHPT.  NCM contacted Cassie with [facility]  To inform her of patient being dc today, and will need to add HHOT.  SOC will begin 24 to 48 hrs post dc.  Patient had a question about section 8 housing , what do she need to do if she needs someone to stay with her, NCM informed her to contact [employer] to let them know and they will give her the process to go thru.  Patient states she will be moving to another place also.     Final next level of care: Home w Home Health Services Barriers to Discharge: No Barriers Identified   Patient Goals and CMS Choice Patient states their goals for this hospitalization and ongoing recovery are:: get better      Discharge Placement                       Discharge Plan and Services                  DME Agency: NA       HH Arranged: PT, OT HH Agency: [facility] Date [place]: 12/28/19 Time [place]: 1115 Representative spoke with at [place]: Cassie  Social Determinants of Health ([place]) Interventions     Readmission Risk Interventions No flowsheet data found.

## 2019-12-30 ENCOUNTER — Other Ambulatory Visit: Payer: Self-pay

## 2019-12-30 ENCOUNTER — Encounter: Payer: Self-pay | Admitting: Family Medicine

## 2019-12-30 ENCOUNTER — Ambulatory Visit (INDEPENDENT_AMBULATORY_CARE_PROVIDER_SITE_OTHER): Payer: Medicare Other | Admitting: Family Medicine

## 2019-12-30 DIAGNOSIS — J441 Chronic obstructive pulmonary disease with (acute) exacerbation: Secondary | ICD-10-CM | POA: Diagnosis not present

## 2019-12-30 NOTE — Progress Notes (Addendum)
   Subjective:    Patient ID: Mary Jenkins, female    DOB: 1955/10/10, 64 y.o.   MRN: [id number]   CC: Hospital follow-up  HPI:  Mary Jenkins is a very pleasant 64 year old female that presents today for work follow-up after being discharged from the hospital due to COPD exacerbation.  During the hospital stay the patient had a CTA to rule out pulmonary embolism and because of mildly elevated troponins had a cardiology referral placed upon discharge.  Patient was discharged with 5-day course of steroids on 4/6. Today patient feels breathing is about back to baseline. Uses 3L O2 at baseline and is back to this currently.  She states she did need to use some albuterol throughout today as she has been "pretty active" throughout the day.  Patient states that she has an appointment with her pulmonologist in the next 2 months and that she is anxiously awaiting her cardiology appointment after the referral has both her and her daughter are not sure what prompted this COPD exacerbation.  She denies respiratory distress, chest pain, or other complaints at this time.  ROS: pertinent noted in the HPI   Pertinent PMH, PSH, FH, SoHx: History of COPD  Objective:  BP 100/60   Pulse 92   SpO2 93%   Used albuterol just prior to appointment, heart rate on recheck 92.  Vitals and nursing note reviewed  General: NAD, pleasant, able to participate in exam, resting with no distress in wheelchair with nasal cannula in place. Cardiac: RRR, no murmurs. Respiratory: Breath sounds faint bilaterally.  Patient with no respiratory distress on 3 L nasal cannula. Extremities: no edema or cyanosis.   Assessment & Plan:    COPD with acute exacerbation [facility]) Assessment: Hospital follow-up with patient after recent COPD exacerbation.  Patient has returned to baseline oxygen requirements and presents with no respiratory distress at rest. Plan: -Patient plans to follow-up with pulmonologist as scheduled -Patient  awaiting call from cardiology referral -Letter provided for patient at her request to ask that her landlord allow her daughter to assist her in day-to-day activities at home -ED precautions given    Lurline Del, [place] PGY-1

## 2019-12-30 NOTE — Patient Instructions (Addendum)
It was great to see you!  Our plans for today:  -I am glad you are doing so much better after your recent hospitalization! -I recommend that you continue to follow-up with your pulmonologist and with the cardiologist once they call to schedule your appointment. -I provided a letter at your request to ask that your daughter can assist you in day-to-day activities at home  Take care and seek immediate care sooner if you develop any concerns.   Dr. [facility]

## 2019-12-30 NOTE — Assessment & Plan Note (Signed)
Assessment: Hospital follow-up with patient after recent COPD exacerbation.  Patient has returned to baseline oxygen requirements and presents with no respiratory distress at rest. Plan: -Patient plans to follow-up with pulmonologist as scheduled -Patient awaiting call from cardiology referral -Letter provided for patient at her request to ask that her landlord allow her daughter to assist her in day-to-day activities at home -ED precautions given

## 2020-01-05 ENCOUNTER — Other Ambulatory Visit: Payer: Self-pay

## 2020-01-05 ENCOUNTER — Encounter (HOSPITAL_COMMUNITY): Payer: Self-pay | Admitting: *Deleted

## 2020-01-05 ENCOUNTER — Emergency Department (HOSPITAL_COMMUNITY): Payer: Medicare Other

## 2020-01-05 ENCOUNTER — Emergency Department (HOSPITAL_COMMUNITY)
Admission: EM | Admit: 2020-01-05 | Discharge: 2020-01-06 | Disposition: A | Discharge status: Home / Self Care | Payer: Medicare Other | Attending: Emergency Medicine | Admitting: Emergency Medicine

## 2020-01-05 DIAGNOSIS — Z87891 Personal history of nicotine dependence: Secondary | ICD-10-CM | POA: Diagnosis not present

## 2020-01-05 DIAGNOSIS — Z79899 Other long term (current) drug therapy: Secondary | ICD-10-CM | POA: Insufficient documentation

## 2020-01-05 DIAGNOSIS — R4182 Altered mental status, unspecified: Secondary | ICD-10-CM | POA: Diagnosis not present

## 2020-01-05 DIAGNOSIS — J441 Chronic obstructive pulmonary disease with (acute) exacerbation: Secondary | ICD-10-CM | POA: Diagnosis not present

## 2020-01-05 DIAGNOSIS — R0602 Shortness of breath: Secondary | ICD-10-CM | POA: Diagnosis present

## 2020-01-05 DIAGNOSIS — Z20822 Contact with and (suspected) exposure to covid-19: Secondary | ICD-10-CM | POA: Insufficient documentation

## 2020-01-05 LAB — BASIC METABOLIC PANEL
Anion gap: 13 (ref 5–15)
BUN: 17 mg/dL (ref 8–23)
CO2: 23 mmol/L (ref 22–32)
Calcium: 8.2 mg/dL — ABNORMAL LOW (ref 8.9–10.3)
Chloride: 102 mmol/L (ref 98–111)
Creatinine, Ser: 1.15 mg/dL — ABNORMAL HIGH (ref 0.44–1.00)
GFR calc Af Amer: 59 mL/min — ABNORMAL LOW (ref >60)
GFR calc non Af Amer: 51 mL/min — ABNORMAL LOW (ref >60)
Glucose, Bld: 233 mg/dL — ABNORMAL HIGH (ref 70–99)
Potassium: 4.7 mmol/L (ref 3.5–5.1)
Sodium: 138 mmol/L (ref 135–145)

## 2020-01-05 LAB — CBC WITH DIFFERENTIAL/PLATELET
Abs Immature Granulocytes: 0.2 10*3/uL — ABNORMAL HIGH (ref 0.00–0.07)
Basophils Absolute: 0.1 10*3/uL (ref 0.0–0.1)
Basophils Relative: 1 %
Eosinophils Absolute: 0.3 10*3/uL (ref 0.0–0.5)
Eosinophils Relative: 2 %
HCT: 36.9 % (ref 36.0–46.0)
Hemoglobin: 10.8 g/dL — ABNORMAL LOW (ref 12.0–15.0)
Immature Granulocytes: 2 %
Lymphocytes Relative: 32 %
Lymphs Abs: 4.3 10*3/uL — ABNORMAL HIGH (ref 0.7–4.0)
MCH: 26.9 pg (ref 26.0–34.0)
MCHC: 29.3 g/dL — ABNORMAL LOW (ref 30.0–36.0)
MCV: 91.8 fL (ref 80.0–100.0)
Monocytes Absolute: 1.2 10*3/uL — ABNORMAL HIGH (ref 0.1–1.0)
Monocytes Relative: 9 %
Neutro Abs: 7.2 10*3/uL (ref 1.7–7.7)
Neutrophils Relative %: 54 %
Platelets: 310 10*3/uL (ref 150–400)
RBC: 4.02 MIL/uL (ref 3.87–5.11)
RDW: 16 % — ABNORMAL HIGH (ref 11.5–15.5)
WBC: 13.3 10*3/uL — ABNORMAL HIGH (ref 4.0–10.5)
nRBC: 0 % (ref 0.0–0.2)

## 2020-01-05 LAB — POCT I-STAT EG7
Acid-base deficit: 1 mmol/L (ref 0.0–2.0)
Bicarbonate: 27.7 mmol/L (ref 20.0–28.0)
Calcium, Ion: 1.09 mmol/L — ABNORMAL LOW (ref 1.15–1.40)
HCT: 36 % (ref 36.0–46.0)
Hemoglobin: 12.2 g/dL (ref 12.0–15.0)
O2 Saturation: 65 %
Potassium: 4.5 mmol/L (ref 3.5–5.1)
Sodium: 138 mmol/L (ref 135–145)
TCO2: 30 mmol/L (ref 22–32)
pCO2, Ven: 66.7 mmHg — ABNORMAL HIGH (ref 44.0–60.0)
pH, Ven: 7.227 — ABNORMAL LOW (ref 7.250–7.430)
pO2, Ven: 41 mmHg (ref 32.0–45.0)

## 2020-01-05 LAB — RESPIRATORY PANEL BY RT PCR (FLU A&B, COVID)
Influenza A by PCR: NEGATIVE
Influenza B by PCR: NEGATIVE
SARS Coronavirus 2 by RT PCR: NEGATIVE

## 2020-01-05 LAB — TROPONIN I (HIGH SENSITIVITY): Troponin I (High Sensitivity): 10 ng/L (ref <18)

## 2020-01-05 LAB — BRAIN NATRIURETIC PEPTIDE: B Natriuretic Peptide: 35.3 pg/mL (ref 0.0–100.0)

## 2020-01-05 MED ORDER — ALBUTEROL SULFATE HFA 108 (90 BASE) MCG/ACT IN AERS
8.0000 | INHALATION_SPRAY | Freq: Once | RESPIRATORY_TRACT | Status: AC
  Administered 2020-01-05: 8 via RESPIRATORY_TRACT
  Filled 2020-01-05: qty 6.7

## 2020-01-05 MED ORDER — METHYLPREDNISOLONE SODIUM SUCC 125 MG IJ SOLR: 125.0000 mg | Freq: Once | INTRAMUSCULAR | Status: DC

## 2020-01-05 MED ORDER — MAGNESIUM SULFATE 2 GM/50ML IV SOLN
2.0000 g | Freq: Once | INTRAVENOUS | Status: AC
  Administered 2020-01-05: 2 g via INTRAVENOUS
  Filled 2020-01-05: qty 50

## 2020-01-05 MED ORDER — PREDNISONE 20 MG PO TABS: 60.0000 mg | ORAL_TABLET | Freq: Every day | ORAL | 0 refills | Status: DC

## 2020-01-05 MED ORDER — AEROCHAMBER PLUS FLO-VU LARGE MISC
Status: AC
  Administered 2020-01-05: 1
  Filled 2020-01-05: qty 1

## 2020-01-05 NOTE — Discharge Instructions (Signed)
You are seen in the emergency department for worsening shortness of breath tonight.  You had blood work chest x-ray EKG that did not show any serious findings.  This is likely a worsening of your COPD.  Will be important for you to have close follow-up with your primary care doctor.  Please continue your regular medications.

## 2020-01-05 NOTE — ED Provider Notes (Signed)
[facility] EMERGENCY DEPARTMENT Provider Note   CSN: [id number] Arrival date & time: 01/05/20  2120     History Chief Complaint  Patient presents with  . Shortness of Breath    Mary Jenkins is a 64 y.o. female.  She is brought in by EMS for altered mental status and shortness of breath.  They said she had increased work of breathing and so they put her on a nonrebreather and gave her Solu-Medrol and nebulizers.  She became less responsive to them and so they assisted her with breathing by bag-valve-mask.  Currently she is stating she is short of breath but denies any chest pain.  She said she felt hot at the house and that is when this happened.  Has a history of COPD and uses 3 L nasal cannula.  She denies any smoking or any drugs.  No abdominal pain numbness weakness headache fevers chills.  No sick contacts or recent travel.  She was admitted to the hospital earlier this month for COPD exacerbation.  The history is provided by the patient and the EMS personnel.  Shortness of Breath Severity:  Severe Onset quality:  Sudden Timing:  Constant Progression:  Unchanged Chronicity:  Recurrent Relieved by:  Nothing Worsened by:  Activity Ineffective treatments:  Oxygen Associated symptoms: no abdominal pain, no chest pain, no cough, no fever, no headaches, no hemoptysis, no neck pain, no rash, no sore throat, no sputum production, no syncope and no wheezing        Past Medical History:  Diagnosis Date  . Breast cancer ([place])   . Cataract, immature    bilateral  . COPD (chronic obstructive pulmonary disease) ([place])    no home O2  . Cough 02/09/2015  . DDD (degenerative disc disease), cervical   . Exertional shortness of breath    states if she "takes her time" doing activities does not get SOB  . Family history of adverse reaction to anesthesia    pt's sister has hx. of post-op N/V  . Full dentures   . GERD (gastroesophageal reflux disease) 02/03/2012  .  High cholesterol    no current med.  Marland Kitchen History of breast cancer   . Personal history of chemotherapy 1997  . Runny nose 02/09/2015   clear drainage, per pt.    Patient Active Problem List   Diagnosis Date Noted  . Shortness of breath 12/27/2019  . Screening for hyperlipidemia 12/03/2019  . Bruising 07/21/2019  . COPD exacerbation ([place]) 01/11/2019  . Healthcare maintenance 05/23/2018  . Chronic respiratory failure with hypoxia ([place]) 04/13/2018  . Weight loss 08/02/2017  . COPD with acute exacerbation ([place]) 06/16/2017  . Severe episode of recurrent major depressive disorder, without psychotic features ([place]) 04/25/2017  . Encounter for HCV screening test for low risk patient 03/10/2017  . History of hyperkalemia 02/13/2016  . Skin lesion of back 11/15/2014  . Atypical chest pain 10/21/2014  . Cataract 10/21/2014  . Dyslipidemia, goal to be determined 04/22/2014  . Lipoma 10/07/2012  . Cervical spine arthritis 08/27/2012  . History of breast cancer 07/23/2012  . GERD (gastroesophageal reflux disease) 02/03/2012  . COPD GOLD IV 06/24/2011  . Liver cyst 06/24/2011  . Insomnia 06/24/2011    Past Surgical History:  Procedure Laterality Date  . BREAST CAPSULOTOMY WITH IMPLANT EXCHANGE Right 06/21/2013   Procedure: REVISION RIGHT BREAST RECONSTRUCTION/REMOVAL OF RIGHT IMPLANT/RIGHT BREAST CAPSULOTOMY WITH INSERT TISSUE EXPLANDER RIGHT BREAST/POSSIBLE LEFT BREAST MASTOPEXY;  Surgeon: Cristine Polio, MD;  Location:  [place];  Service: Plastics;  Laterality: Right;  . BREAST RECONSTRUCTION Right 06/21/2013   Procedure: BREAST RECONSTRUCTION;  Surgeon: Cristine Polio, MD;  Location: [place];  Service: Plastics;  Laterality: Right;  . BREAST RECONSTRUCTION Left 11/29/2013   Procedure: LEFT MASTOPEXY FOR RECONSTRUCTION;  Surgeon: Cristine Polio, MD;  Location: [place];  Service: Plastics;  Laterality: Left;  . BREAST RECONSTRUCTION Right  03/14/2014   Procedure: RECONSTRUCTION NIPPLE RIGHT BREAST;  Surgeon: Cristine Polio, MD;  Location: [place];  Service: Plastics;  Laterality: Right;  . BREAST SURGERY  97   implant  . CAPSULOTOMY Right 06/30/2017   Procedure: CAPSULOTOMY;  Surgeon: Cristine Polio, MD;  Location: [place];  Service: Plastics;  Laterality: Right;  . COLONOSCOPY    . MASTECTOMY, PARTIAL Right 1997   -node dissection  . REDUCTION MAMMAPLASTY Left   . REMOVAL OF TISSUE EXPANDER AND PLACEMENT OF IMPLANT Right 02/13/2015   Procedure: REMOVAL OF TISSUE  EXPANDER  PORT RIGHT BREAST;  Surgeon: Cristine Polio, MD;  Location: [place];  Service: Plastics;  Laterality: Right;  . REMOVAL OF TISSUE EXPANDER AND PLACEMENT OF IMPLANT Right 06/30/2017   Procedure: REMOVAL OF RIGHT TISSUE EXPANDER WITH PLACEMENT OF RIGHT GELL BREAST IMPLANTS;  Surgeon: Cristine Polio, MD;  Location: [place];  Service: Plastics;  Laterality: Right;  . SCAR REVISION Right 02/13/2015   Procedure: SCAR REVISION RIGHT BREAST;  Surgeon: Cristine Polio, MD;  Location: [place];  Service: Plastics;  Laterality: Right;  . TISSUE EXPANDER PLACEMENT Right 03/14/2014   Procedure: SALINE REMOVAL RIGHT TISSUE EXPANDER;  Surgeon: Cristine Polio, MD;  Location: [place];  Service: Plastics;  Laterality: Right;  . TUBAL LIGATION  1980's     OB History   No obstetric history on file.     Family History  Problem Relation Age of Onset  . Stroke Mother   . Heart disease Brother   . Heart disease Brother   . Anesthesia problems Sister        post-op N/V  . Cancer Sister        Endometrial CA  . Heart disease Brother   . Tuberculosis Daughter        not active  . Allergic rhinitis Daughter   . Allergic rhinitis Son   . Hypertension Sister   . Diabetes Sister   . Breast cancer Neg Hx     Social History   Tobacco Use  . Smoking  status: Former Smoker    Quit date: 07/23/2012    Years since quitting: 7.4  . Smokeless tobacco: Never Used  Substance Use Topics  . Alcohol use: No    Alcohol/week: 0.0 standard drinks    Comment: one beer every now and then  . Drug use: No    Home Medications Prior to Admission medications   Medication Sig Start Date End Date Taking? Authorizing Provider  albuterol (PROAIR HFA) 108 (90 Base) MCG/ACT inhaler Inhale 2 puffs into the lungs every 6 (six) hours as needed for wheezing or shortness of breath. 07/21/19   Bartholomew Crews, MD  albuterol (PROVENTIL) (2.5 MG/3ML) 0.083% nebulizer solution USE 3 ML IN NEBULIZER EVERY 6 HOURS AS NEEDED FOR  WHEEZING  OR  SHORTNESS  OF  BREATH Patient taking differently: Take 2.5 mg by nebulization every 6 (six) hours as needed for wheezing or shortness of breath.  06/14/19   Christinia Gully  B, MD  Budeson-Glycopyrrol-Formoterol (BREZTRI AEROSPHERE) 160-9-4.8 MCG/ACT AERO Inhale 2 puffs into the lungs 2 (two) times daily. 12/28/19   Mullis, Kiersten P, DO  cycloSPORINE (RESTASIS) 0.05 % ophthalmic emulsion 1 drop 2 (two) times daily.    [provider]  mirtazapine (REMERON) 15 MG tablet Take 1 tablet (15 mg total) by mouth at bedtime. 12/03/19   Matilde Haymaker, MD  OXYGEN Place 2-3 L into the nose continuous.     [provider]  valACYclovir (VALTREX) 1000 MG tablet TAKE 1 TABLET BY MOUTH TWICE DAILY FOR 3 DAYS AS NEEDED Patient taking differently: Take 1,000 mg by mouth 2 (two) times daily as needed (for flare up).  05/24/19   Shelly Bombard, MD    Allergies    Patient has no known allergies.  Review of Systems   Review of Systems  Constitutional: Negative for fever.  HENT: Negative for sore throat.   Eyes: Negative for visual disturbance.  Respiratory: Positive for shortness of breath. Negative for cough, hemoptysis, sputum production and wheezing.   Cardiovascular: Negative for chest pain and syncope.  Gastrointestinal:  Negative for abdominal pain.  Genitourinary: Negative for dysuria.  Musculoskeletal: Negative for neck pain.  Skin: Negative for rash.  Neurological: Negative for headaches.    Physical Exam Updated Vital Signs Pulse 98   Temp (!) 97.4 F (36.3 C)   Resp (!) 23   SpO2 100%   Physical Exam Vitals and nursing note reviewed.  Constitutional:      General: She is in acute distress.     Appearance: She is well-developed.  HENT:     Head: Normocephalic and atraumatic.  Eyes:     Conjunctiva/sclera: Conjunctivae normal.  Cardiovascular:     Rate and Rhythm: Normal rate and regular rhythm.     Heart sounds: No murmur.  Pulmonary:     Effort: Tachypnea, accessory muscle usage and respiratory distress present.     Breath sounds: Decreased breath sounds present.  Abdominal:     Palpations: Abdomen is soft.     Tenderness: There is no abdominal tenderness.  Musculoskeletal:        General: Normal range of motion.     Cervical back: Neck supple.     Right lower leg: No tenderness. No edema.     Left lower leg: No tenderness. No edema.  Skin:    General: Skin is warm and dry.     Capillary Refill: Capillary refill takes less than 2 seconds.  Neurological:     General: No focal deficit present.     Mental Status: She is alert.     ED Results / Procedures / Treatments   Labs (all labs ordered are listed, but only abnormal results are displayed) Labs Reviewed  CBC WITH DIFFERENTIAL/PLATELET - Abnormal; Notable for the following components:      Result Value   WBC 13.3 (*)    Hemoglobin 10.8 (*)    MCHC 29.3 (*)    RDW 16.0 (*)    Lymphs Abs 4.3 (*)    Monocytes Absolute 1.2 (*)    Abs Immature Granulocytes 0.20 (*)    All other components within normal limits  BASIC METABOLIC PANEL - Abnormal; Notable for the following components:   Glucose, Bld 233 (*)    Creatinine, Ser 1.15 (*)    Calcium 8.2 (*)    GFR calc non Af Amer 51 (*)    GFR calc Af Amer 59 (*)    All  other components within normal limits  POCT I-STAT EG7 - Abnormal; Notable for the following components:   pH, Ven 7.227 (*)    pCO2, Ven 66.7 (*)    Calcium, Ion 1.09 (*)    All other components within normal limits  RESPIRATORY PANEL BY RT PCR (FLU A&B, COVID)  BRAIN NATRIURETIC PEPTIDE  I-STAT VENOUS BLOOD GAS, ED  TROPONIN I (HIGH SENSITIVITY)  TROPONIN I (HIGH SENSITIVITY)    EKG EKG Interpretation  Date/Time:  Wednesday January 05 2020 21:51:43 EDT Ventricular Rate:  101 PR Interval:    QRS Duration: 82 QT Interval:  331 QTC Calculation: 429 R Axis:   94 Text Interpretation: Sinus tachycardia Right axis deviation Nonspecific T abnormalities, lateral leads Confirmed by Aletta Edouard [phone]) on 01/05/2020 9:55:35 PM   Radiology DG Chest Port 1 View  Result Date: 01/05/2020 CLINICAL DATA:  Shortness of breath EXAM: PORTABLE CHEST 1 VIEW COMPARISON:  12/27/2019 FINDINGS: 2 frontal views of the chest demonstrate stable emphysema. No airspace disease, effusion, or pneumothorax. Stable cardiac silhouette. No acute bony abnormalities. IMPRESSION: 1. Stable emphysema.  No acute process. Electronically Signed   By: Randa Ngo M.D.   On: 01/05/2020 21:56    Procedures Procedures (including critical care time)  Medications Ordered in ED Medications  magnesium sulfate IVPB 2 g 50 mL (0 g Intravenous Stopped 01/05/20 2241)  albuterol (VENTOLIN HFA) 108 (90 Base) MCG/ACT inhaler 8 puff (8 puffs Inhalation Given 01/05/20 2241)  AeroChamber Plus Flo-Vu Large MISC (1 each  Given 01/05/20 2241)    ED Course  I have reviewed the triage vital signs and the nursing notes.  Pertinent labs & imaging results that were available during my care of the patient were reviewed by me and considered in my medical decision making (see chart for details).  Clinical Course as of Jan 05 1006  Wed Jan 05, 2020  2144 Reevaluation, patient already looks a lot more comfortable.  She is back on nasal  cannula and is beginning to be able to slow down her respiratory rate.  She just been released from the hospital a few days ago.  She said she felt extremely hot in her apartment and tried to use the ACE and breathing treatments but it overwhelmed her.   [MB]  2146 Ischial EKG shows a lot of artifact.  The computer read out as A. fib but I think it is a sinus with poor baseline.  No acute ST-T's.  Getting repeat EKG   [MB]  2151 Chest x-ray interpreted by me as COPD.  Density right lower lung probably related to her breast implant.   [MB]  2151 VBG showing some acidosis with CO2 retention.   [MB]  4403 And resting comfortably.  Sats have remained 99%.  She is no longer tachypneic and has been sleeping.  We will do a trending pulse ox and if she does okay with that likely can discharge.   [MB]  2319 Blood pressure on the low end here although on review of prior historical data she is at her baseline.  She remains a little bit tachycardic although has received some breathing treatments.   [MB]  2324 Patient ambulated in the department here on her 3 L and sats remained 97% and she felt comfortable.  She is okay to be discharged.   [MB]    Clinical Course User Index [MB] Hayden Rasmussen, MD   MDM Rules/Calculators/A&P  This patient complains of shortness of breath and decreased responsiveness; this involves an extensive number of treatment Options and is a complaint that carries with it a high risk of complications and Morbidity. The differential includes COPD, proximal, pneumonia, hypercapnia, ingestion, metabolic derangement  I ordered, reviewed and interpreted labs, which included VBG which shows a low pH and elevated PCO2 consistent with some hypercapnia.  Elevated white count of unclear significance recent steroids.  Chemistry showing an elevated glucose mild elevation of creatinine.  Covid testing negative. I ordered medication magnesium and albuterol I ordered  imaging studies which included chest x-ray and I independently    visualized and interpreted imaging which showed COPD no gross infiltrates Additional history obtained from daughter Previous records obtained and reviewed in epic including last admission just over a week ago   After the interventions stated above, I reevaluated the patient and found patient's respiratory rate and pulse oximetry to be improved.  She is ambulated in the department and feels comfortable with discharge.   Final Clinical Impression(s) / ED Diagnoses Final diagnoses:  COPD exacerbation ([place])    Rx / DC Orders ED Discharge Orders         Ordered    predniSONE (DELTASONE) 20 MG tablet  Daily     01/05/20 2239           Hayden Rasmussen, MD 01/06/20 1010

## 2020-01-05 NOTE — ED Notes (Signed)
Ambulated patient with 3 L O2 (home O2 for patient). She denied feeling SOB. Saturations maintained 96-100%, denied dizziness, does c/o headache.

## 2020-01-05 NOTE — ED Triage Notes (Signed)
Pt arrives via [facility] from home with c/o SOB and AMS. Pt arrived, family reporting hx of COPD at the scene. Pt with increased WOB on EMS arrival to the scene, she was given 2 duoneb and 125 solumedrol en route to IV in the left AC. Pt was altered on EMS arrival to scene, unable to follow commands. Resp assisted with BVM by ems, arrives to ED on NRB. They were unable to obtain initial oxygen saturation.

## 2020-01-05 NOTE — ED Notes (Signed)
PT says that she got up to go to the restroom and she got very hot and became short of breath, she tried to use her nebulizer but was unable d/t SOB

## 2020-01-06 NOTE — ED Notes (Signed)
Patient verbalizes understanding of discharge instructions. Opportunity for questioning and answers were provided. Armband removed by staff, pt discharged from ED. Pt. ambulatory and discharged home.

## 2020-02-15 ENCOUNTER — Telehealth: Payer: Self-pay | Admitting: Internal Medicine

## 2020-02-15 DIAGNOSIS — J449 Chronic obstructive pulmonary disease, unspecified: Secondary | ICD-10-CM

## 2020-02-15 NOTE — Telephone Encounter (Signed)
Spoke with pt. She is needing a new neb machine and neb machine supplies. Order has been placed. Nothing further was needed.

## 2020-02-22 ENCOUNTER — Telehealth (INDEPENDENT_AMBULATORY_CARE_PROVIDER_SITE_OTHER): Payer: Medicare Other | Admitting: Family Medicine

## 2020-02-22 ENCOUNTER — Telehealth: Payer: Self-pay

## 2020-02-22 ENCOUNTER — Encounter: Payer: Self-pay | Admitting: Family Medicine

## 2020-02-22 ENCOUNTER — Other Ambulatory Visit: Payer: Self-pay

## 2020-02-22 VITALS — Temp 97.7°F

## 2020-02-22 DIAGNOSIS — R6883 Chills (without fever): Secondary | ICD-10-CM

## 2020-02-22 NOTE — Telephone Encounter (Signed)
Patient calls nurse line reporting cold chills, runny nose, nasal congestion and headache. Patient does report sick contact with grand child who had a bad cough. Patient denies fever, recent temperature of 97.9. Patient states "I just don't feel right". Patient reports receiving COVID vaccinations. Patient is concerned of symptoms due to COPD and breathing difficulties in the past.   Advised patient to try using humidifier and nasal spray to help with nasal congestion and runny nose as well as tylenol/ motrin for chills and body aches. Patient requests to speak with doctor virtually.   Scheduled virtual appointment this afternoon with Dr. Owens Shark.   Forwarding to Dr. Owens Shark.   Talbot Grumbling, RN

## 2020-02-22 NOTE — Progress Notes (Signed)
[facility] Visit  Patient consented to have virtual visit and was identified by name and date of birth. Method of visit: Video was attempted, but technology challenges prevented patient from using video, so visit was conducted via telephone.  Encounter participants: Patient: Mary Jenkins - located at home Provider: Martyn Malay - located at [facility] Others (if applicable): NA   Chief Complaint: cold chills   HPI:  Mary Jenkins is a pleasant 64 year old woman with history of chronic obstructive pulmonary disease on 3 L of home oxygen, breast cancer and GERD presenting today via telephone visit for cold chills.  She reports that for the last several weeks she has intermittent cold chills.  She is taking her temperature multiple times but not had a fever.  She has an ongoing complaint of rhinorrhea which has been present for more than 6 months.  She denies dyspnea, cough or other sick symptoms.  She reports she is worried about the chills because it could represent thyroid disease or another condition.  Of note the patient was recently admitted to the hospital and recommended to have cardiology follow-up.  Patient has received both Covid vaccines  ROS: per HPI  Pertinent PMHx: COPD on home  Exam:  Temp 97.7 F (36.5 C)   Respiratory: Speaking in full sentences able to talk freely to provider. Assessment/Plan:  Hospital follow-up and chills, given the duration of the symptoms, previous administration of Covid vaccine and recent course of prednisone recommend in person evaluation as COVID or similar infection much less likely.  Would recommend considering differential such as thyroid disease or other considerations given her symptoms.  The patient is also to be seen by cardiology as an outpatient which has not yet been coordinated.  Recommend both of these are accomplished tomorrow morning. Scheduled on access to care visit.  I  recommended if her symptoms change she call us.  Dorris Singh, MD  Family Medicine Teaching Service

## 2020-02-23 ENCOUNTER — Ambulatory Visit (INDEPENDENT_AMBULATORY_CARE_PROVIDER_SITE_OTHER): Payer: Medicare Other | Admitting: Family Medicine

## 2020-02-23 VITALS — BP 110/64 | HR 78 | Temp 98.2°F | Ht 66.0 in | Wt 97.8 lb

## 2020-02-23 DIAGNOSIS — R6883 Chills (without fever): Secondary | ICD-10-CM | POA: Diagnosis not present

## 2020-02-23 LAB — POCT URINALYSIS DIP (MANUAL ENTRY)
Bilirubin, UA: NEGATIVE
Blood, UA: NEGATIVE
Glucose, UA: NEGATIVE mg/dL
Nitrite, UA: NEGATIVE
Protein Ur, POC: NEGATIVE mg/dL
Spec Grav, UA: 1.025 (ref 1.010–1.025)
Urobilinogen, UA: 0.2 E.U./dL
pH, UA: 6 (ref 5.0–8.0)

## 2020-02-23 LAB — POCT UA - MICROSCOPIC ONLY

## 2020-02-23 NOTE — Progress Notes (Addendum)
    SUBJECTIVE:   CHIEF COMPLAINT / HPI:   Cold chills. Patient is complaining of feeling cold chills. No fevers at home. Checked temp at home 97.7. Duration: 2 weeks. Happens 1-2x a weeks. Associated with anxious feelsing, "scared like will get SOB", but never gets SOB. Denies night sweats. Weight stable. No change in appetites. NO CP. Chronic cough, but Unchaged. Vaccinated against Covid 19. Denies constipation. Having regular daily BM. No sick contacts. No new medications. No addominal pain. No nasal congestoin.    Patient has end-stage emphysema and is chronically on 3 L nasal cannula oxygen.  No history of thyroid disease. FHx of 2 sisters with thyroid disease.   Patient is UTD on mammogram, colonoscopy, Pap smear Weight stable around 97 pounds.  OBJECTIVE:   BP 110/64 Comment: left arm  Pulse 78   Temp 98.2 F (36.8 C) (Oral)   Ht _0  (1.676 m)   Wt 97 lb 12.8 oz (44.4 kg)   SpO2 93%   BMI 15.79 kg/m   Gen: NAD, resting comfortably CV: RRR with no murmurs appreciated Pulm: NWOB, CTAB with no crackles, wheezes, or rhonchi GI:  Soft, Nontender, Nondistended. MSK: no edema,  Skin: warm, dry Neuro: grossly normal, moves all extremities Psych: Normal affect and thought content    ASSESSMENT/PLAN:   Chills (without fever) Patient is small individual with minimal adipose tissue.  Perhaps it is thermoregulatory issue.  Patient does not have a history of endocrine dysfunction, unlikely autonomic instability.  Need to take TSH to rule out cold intolerance from this, although does not have any other associated symptoms. Concern for underlying infection versus malignancy.  Although weight is stable and patient is UTD on cancer screening. -CBC -ESR -CRP -UA -TSH - follow-up in 1 month     Bonnita Hollow, MD [place]

## 2020-02-23 NOTE — Assessment & Plan Note (Signed)
Patient is small individual with minimal adipose tissue.  Perhaps it is thermoregulatory issue.  Patient does not have a history of endocrine dysfunction, unlikely autonomic instability.  Need to take TSH to rule out cold intolerance from this, although does not have any other associated symptoms. Concern for underlying infection versus malignancy.  Although weight is stable and patient is UTD on cancer screening. -CBC -ESR -CRP -UA -TSH - follow-up in 1 month

## 2020-02-23 NOTE — Patient Instructions (Signed)
We checking blood work to look for infection, possible malignancy, thyroid issues.   Please keep a diarry of when the chills occur to follow up on possible triggers.

## 2020-02-24 ENCOUNTER — Telehealth: Payer: Self-pay | Admitting: Family Medicine

## 2020-02-24 DIAGNOSIS — N3000 Acute cystitis without hematuria: Secondary | ICD-10-CM

## 2020-02-24 LAB — TSH: TSH: 1.65 u[IU]/mL (ref 0.450–4.500)

## 2020-02-24 LAB — CBC WITH DIFFERENTIAL/PLATELET
Basophils Absolute: 0.1 10*3/uL (ref 0.0–0.2)
Basos: 1 %
EOS (ABSOLUTE): 0.2 10*3/uL (ref 0.0–0.4)
Eos: 3 %
Hematocrit: 35.3 % (ref 34.0–46.6)
Hemoglobin: 11.5 g/dL (ref 11.1–15.9)
Immature Grans (Abs): 0 10*3/uL (ref 0.0–0.1)
Immature Granulocytes: 0 %
Lymphocytes Absolute: 1.6 10*3/uL (ref 0.7–3.1)
Lymphs: 25 %
MCH: 26.7 pg (ref 26.6–33.0)
MCHC: 32.6 g/dL (ref 31.5–35.7)
MCV: 82 fL (ref 79–97)
Monocytes Absolute: 0.6 10*3/uL (ref 0.1–0.9)
Monocytes: 9 %
Neutrophils Absolute: 3.8 10*3/uL (ref 1.4–7.0)
Neutrophils: 62 %
Platelets: 228 10*3/uL (ref 150–450)
RBC: 4.3 x10E6/uL (ref 3.77–5.28)
RDW: 15.2 % (ref 11.7–15.4)
WBC: 6.2 10*3/uL (ref 3.4–10.8)

## 2020-02-24 LAB — C-REACTIVE PROTEIN: CRP: 1 mg/L (ref 0–10)

## 2020-02-24 LAB — SEDIMENTATION RATE: Sed Rate: 21 mm/hr (ref 0–40)

## 2020-02-24 MED ORDER — CEPHALEXIN 500 MG PO CAPS: 500.0000 mg | ORAL_CAPSULE | Freq: Two times a day (BID) | ORAL | 0 refills | Status: AC

## 2020-02-24 NOTE — Telephone Encounter (Signed)
Patient labs are abnormal for possible UTI. Remainder of labs are normal. Called patient to inform. Will trial course of keflex. Patient to call office back to tell us how she is doing.

## 2020-02-28 ENCOUNTER — Other Ambulatory Visit: Payer: Self-pay | Admitting: Internal Medicine

## 2020-03-06 ENCOUNTER — Other Ambulatory Visit: Payer: Self-pay | Admitting: *Deleted

## 2020-03-06 MED ORDER — ALBUTEROL SULFATE HFA 108 (90 BASE) MCG/ACT IN AERS: 2.0000 | INHALATION_SPRAY | Freq: Four times a day (QID) | RESPIRATORY_TRACT | 3 refills | Status: AC | PRN

## 2020-03-07 ENCOUNTER — Ambulatory Visit: Payer: Medicare Other | Admitting: Internal Medicine

## 2020-03-17 ENCOUNTER — Encounter: Payer: Self-pay | Admitting: Internal Medicine

## 2020-03-17 ENCOUNTER — Other Ambulatory Visit: Payer: Self-pay

## 2020-03-17 ENCOUNTER — Ambulatory Visit (INDEPENDENT_AMBULATORY_CARE_PROVIDER_SITE_OTHER): Payer: Medicare Other | Admitting: Internal Medicine

## 2020-03-17 DIAGNOSIS — J449 Chronic obstructive pulmonary disease, unspecified: Secondary | ICD-10-CM | POA: Diagnosis not present

## 2020-03-17 DIAGNOSIS — J9611 Chronic respiratory failure with hypoxia: Secondary | ICD-10-CM

## 2020-03-17 MED ORDER — PREDNISONE 10 MG PO TABS: ORAL_TABLET | ORAL | 11 refills | Status: DC

## 2020-03-17 NOTE — Progress Notes (Signed)
Subjective:    Patient ID: Mary Jenkins, female    DOB: November 25, 1955  MRN: [id number]    Brief patient profile:   19  yobf quit smoking  07/23/12 not taking breathing medications at that point and subsequently placed on multiple meds and still sob just getting dressed so referred from the medicine clinic at [facility] 02/13/2012 for pulmonary eval with documented GOLD IV COPD 02/2013    History of Present Illness  02/13/2012 1st pulmonary cc progressive worse x 6 months doe x 50-100 ft can't do a grocery store where could do before quit. No real variability, cough is better with no excess or purulent sputum.  Not using saba daytime because finds if she's holding still doesn't need it as oftern. Some better p last  Prednisone rx. Not much variability. rec Stop atrovent and advair Start symbiocort Take 2 puffs first thing in am and then another 2 puffs about 12 hours later.  Take after only am dose Spiriva Protonix 40 mg Take 30-60 min before first meal of the day  GERD diet Only use your albuterol (Plan B= ventolin puffer,  Plan C is nebulizer) as a rescue medication to be used if you can't catch your breath by resting or doing a relaxed purse lip breathing pattern. The less you use it, the better it will work when you need it. Ok to use rescue up to every 4 hours if doing poorly.       12/18/2017  f/u ov/Mary Jenkins re:  COPD IV/ symb/ spiriva tapering pred  Chief Complaint  Patient presents with  . Follow-up    SOB with activity, productive cough -yellow thick mucus, slight wheezing at night more   Dyspnea:  MMRC3 = can't walk 100 yards even at a slow pace at a flat grade s stopping due to sob  Cough: congested cough  Sleep: ok flat most noct SABA use:  Not using  In any form  Rec Work on inhaler technique:  Finish prednisone as you  plan  For cough > mucinex dm up to 1200 mg daily and use flutter valve as much as possible  Whenever cough flares should be on priloec 20 mg Take 30- 60 min before  your first and last meals of the day then ok when cough better for a week to leave the prilosec back off  Please schedule a follow up office visit in 4 weeks, sooner if needed  with all medications /inhalers/ solutions in hand so we can verify exactly what you are taking. This includes all medications from all doctors and over the counters     01/15/2018  f/u ov/Mary Jenkins re: copd iv/ did not bring meds  Chief Complaint  Patient presents with  . Follow-up    Reports increased SOB since weather has changed. Reports her daughter used some kind of incense mist and her breathing has been worse every since. No improvement noted with neb or inhaler.   Dyspnea:  Still MMRC3 = can't walk 100 yards even at a slow pace at a flat grade s stopping due to sob   Cough: worse with certain foods / exp to incense / non prod Sleep:   No 02 / flat ok  SABA use:  Last used 2 weeks prior to OV   Feels much better on prednisone  And worse off it  Taking ppi hs       07/14/2018  f/u ov/Mary Jenkins re:  Copd GOLD IV/ 02 dep / ? slt flare p  uri exp  Chief Complaint  Patient presents with  . Follow-up    Increased SOB recently- relates to her grandson having a cold. She has some chest tightness. She is using her albuterol inhaler 2 x daily and rarely use her neb.    Dyspnea:  [employer] on 3lpm poc but does not check sats Cough: min cough / nothing purulent Sleeping: flat / no pillows SABA use: as above/ chest tightness better p saba though hfa ti too short - see a/p  02: 2lpm at hs / 3lpm poc  / going back to school and present POC too noisy so turns it off then rec Spiriva should be 4 puffs of the blue or 2 pffs of the green  Tipped  spiriva each am Prednisone 10 mg take  4 each am x 2 days,   2 each am x 2 days,  1 each am x 2 days and stop   Continue omeprazole 20 mg Take 30-60 min before first meal of the day but add another dose 30 min supper for respiratory flares Plan B = Backup Only use your albuterol (ventolin)  as a rescue medication  Plan C = Crisis - only use your albuterol nebulizer if you first try Plan B and it fails to help > ok to use the nebulizer up to every 4 hours but if start needing it regularly call for immediate appointment   10/21/2018  f/u ov/Mary Jenkins re:  Copd GOLD IV /  Chief Complaint  Patient presents with  . Follow-up    Pt c/o increased cough and SOB over the past month.  She is using the ventolin inhaler multiple times per day. She rarely uses neb.    Dyspnea:  Last food lion x few months Cough: more congestion/ esp in am and hs  Sleeping: prop up 30 degrees vs before  SABA use: as above, much more since ran out of spiriva  02: 2lpm hs,  None sitting,  2-3 poc  rec Plan A = Automatic = symbicort / spiriva 2 pffs of each first thing in am then 12 hours later symbicort Plan B = Backup Only use your albuterol inhaler as a rescue medication Plan C = Crisis - only use your albuterol nebulizer if you first try Plan B and it fails to help > ok to use the nebulizer up to every 4 hours but if start needing it regularly call for immediate appointment Prednisone 10 mg take  4 each am x 2 days,   2 each am x 2 days,  1 each am x 2 days and stop    Date of Admission: 01/11/2019    Date of Discharge: 01/12/2019    Discharge Diagnosis: 1.  Acute exacerbation of COPD    TAKE these medications   albuterol (2.5 MG/3ML) 0.083% nebulizer solution Commonly known as:  PROVENTIL USE 3 ML IN NEBULIZER  EVERY 6 HOURS AS NEEDED FOR WHEEZING OR SHORTNESS OF BREATH What changed:  See the new instructions.   Ventolin HFA 108 (90 Base) MCG/ACT inhaler Generic drug:  albuterol INHALE 2 PUFFS BY MOUTH EVERY 6 HOURS AS NEEDED FOR WHEEZING What changed:    how much to take  how to take this  when to take this  reasons to take this  additional instructions   aspirin EC 81 MG tablet Take 81 mg by mouth daily.   diclofenac sodium 1 % Gel Commonly known as:  Voltaren Apply 2 g  topically 4 (four) times daily.  What changed:    when to take this  reasons to take this   omeprazole 20 MG capsule Commonly known as:  PRILOSEC Take 1 capsule (20 mg total) by mouth 2 (two) times daily before a meal.   OXYGEN Place 2-3 L into the nose 2 (two) times a day. 2lpm with rest and 3 with exertion   predniSONE 20 MG tablet Commonly known as:  DELTASONE Take 2 tablets (40 mg total) by mouth daily with breakfast. Start taking on:  January 13, 2019   pregabalin 75 MG capsule Commonly known as:  Lyrica Take 1 capsule (75 mg total) by mouth 2 (two) times daily.   Restasis 0.05 % ophthalmic emulsion Generic drug:  cycloSPORINE Place 2 drops into both eyes daily.   Symbicort 160-4.5 MCG/ACT inhaler Generic drug:  budesonide-formoterol Inhale 2 puffs by mouth twice daily   Tiotropium Bromide Monohydrate 2.5 MCG/ACT Aers Commonly known as:  Spiriva Respimat Inhale 2 puffs into the lungs daily.   valACYclovir 1000 MG tablet Commonly known as:  VALTREX TAKE ONE TABLET BY MOUTH TWICE DAILY FOR 3 DAYS AS NEEDED What changed:    how much to take  how to take this  when to take this  reasons to take this  additional instructions       Disposition and follow-up:   Mary Jenkins was discharged from [facility] in [place] condition.  At the hospital follow up visit please address:  1.  Acute exacerbation of COPD: Ensure compliance with nebulizer and inhaler.  Abstain from secondhand smoke.  2.  Labs / imaging needed at time of follow-up: None  3.  Pending labs/ test needing follow-up: None  Follow-up Appointments:  Hospital Course by problem list: 1. Acute exacerbation of COPDGOLD IV: Ms. Rzasa is a 64 year old African-American woman with oxygen dependent COPDGOLD IV on 2L [employer],GERD, cervical degenerative joint disease who presented to [facility] emergency department on November 20, 2018 with a 1 day history of worsening  dyspnea on exertion however she denied cough, sputum production, fevers, chills, sick contact, URI symptoms, chest pain, lightheadedness,calf pain.  Her acute onset dyspnea did not appear to be secondary to pneumonia, pulmonary embolism, acute coronary syndrome or COVID-19.  She did report of exposure to secondhand smoking.  On route to the hospital she received intramuscular epinephrine, albuterol, DuoNebs, Solu-Medrol, magnesium sulfate complete resolution of symptoms.  During her hospitalization she maintain excellent oxygen saturation on her home supplemental oxygen.  She was continued on inhalers and discharged on a 5-day course of prednisone.             01/22/2019  f/u ov/Mary Jenkins re: GOLD IV copd/ 02 dep/ s/p admit p 5 days of increased ventolin need and did not call for appt as rec prior to 911 and says "wasn't able to get to nebulizer in time" which denies using at all  in the 5 days leading up to admit and was exposed to daughter's cig smoke may have tipped her over.  Chief Complaint  Patient presents with  . Acute Visit    Pt c/o chest tightness since d/c from hospital 01/12/2019.   breathing was improved when arrived home as was the chest tightness but worsened p completed p finished prednisone course which was much  shorter than usual  Dyspnea:  Able to get room to room on 2lpm does not titrate  Cough: none Sleeping: able to lie flat bed on one pillow ok  SABA use: not understanding  how/ when to use various inhalers  = proair, dulera/combivent/ventolin 02: 2lpm hs  rec  Plan A = Automatic = symbicort 160 Take 2 puffs first thing in am and then another 2 puffs about 12 hours later and spiriva 2 pffs each am  Work on inhaler technique:    Plan B = Backup Only use your albuterol (proair or ventolin) inhaler Plan C = Crisis - only use your albuterol nebulizer if you first try Plan B and it fails to help > ok to use the nebulizer up to every 4 hours but if start needing it regularly  call for immediate appointment Prednisone 10 mg Take 4 for three days 3 for three days 2 for three days 1 for three days and stop Omeprazole 20 mg Take 30- 60 min before your first and last meals of the day     04/21/2019  f/u ov/Mary Jenkins re:  [place] Chief Complaint  Patient presents with  . Follow-up    Breathing is overall doing well. She uses her albuterol inhaler about once per wk on average. She has not been using her neb.    Dyspnea:  Very sedentary  But doing FL  On 3lpm poc  = MMRC3 = can't walk 100 yards even at a slow pace at a flat grade s stopping due to sob   Cough: none  Sleeping: ok flat/ one pillow  SABA use: much need for saba  02: 3lpm cont at home and pulsed when out  rec No change rx    09/02/19 aecopd rx pred/ zpak > much better   10/25/2019  f/u ov/Mary Jenkins re: GOLD IV having trouble keeping up with spiriva and symb Chief Complaint  Patient presents with  . Follow-up    Breathing has been progressively worse over the past 2 months. She states she sometimes gets SOB just eating. She is using her proair 5 x per day on average and albuterol neb about 2 x per wk.   Dyspnea:  Across the room  = MMRC4  = sob if tries to leave home or while getting dressed   Cough: smoker's rattle, worse in am but denies smoking  Sleeping: flat, / one pillow SABA use: as above  02: 3lpm 24/7  rec Work on inhaler technique  Plan A = Automatic = Always=   Breztri Take 2 puffs first thing in am and then another 2 puffs about 12 hours later.  Prednisone 10 mg take  4 each am x 2 days,   2 each am x 2 days,  1 each am x 2 days and stop  Plan B = Backup (to supplement plan A, not to replace it) Only use your albuterol inhaler (proair)as a rescue medication Plan C = Crisis (instead of Plan B but only if Plan B stops working) - only use your albuterol nebulizer if you first try Plan B and it fails to help > ok to use the nebulizer up to every 4 hours but if start needing it regularly call for  immediate appointment   12/06/2019  f/u ov/Mary Jenkins re:  GOLD IV / 02 dep maint on breztri 2 bid  Chief Complaint  Patient presents with  . Follow-up    Breathing has improved some since the last visit. She is using her albuterol inhaler 1-2 x per wk. She has used neb only once since the last visit.   Dyspnea:  Room to room s walker on 3lpm with activity   Cough: no  purulent / excess mucus  Sleeping: flat/ one pillow SABA use: as above 02: 3lpm at hs/ not at rest / up to 3lpm with activity, typically none at rest with sats 94%   rec No change in medications We will try to get you some home Respiratory Rehabilitation help.    03/17/2020  f/u ov/Mary Jenkins re:  GOLD IV ./ 02 dep Breztri  Chief Complaint  Patient presents with  . Follow-up    Pt states breathing has been slightly worse since her last visit and has had to go to ED x 2 for SOB. She has cough with clear sputum. She states that she rarely uses her albuterol.    Dyspnea:  Room to room / not checking sats  Cough: minimal mucoid  Sleeping: ok flat one pillow  SABA use: poor hfa/ uses neb sev times a day  02: 3lpm hs and does not use daytime prn inappropriately  (takes it off when's she's sob and takes "the other machine instead " = neb pred always helps reduce saba dep in past   No obvious day to day or daytime variability or assoc excess/ purulent sputum or mucus plugs or hemoptysis or cp or chest tightness, subjective wheeze or overt sinus or hb symptoms.   Sleeping as above without nocturnal  or early am exacerbation  of respiratory  c/o's or need for noct saba. Also denies any obvious fluctuation of symptoms with weather or environmental changes or other aggravating or alleviating factors except as outlined above   No unusual exposure hx or h/o childhood pna/ asthma or knowledge of premature birth.  Current Allergies, Complete Past Medical History, Past Surgical History, Family History, and Social History were reviewed in  [employer] record.  ROS  The following are not active complaints unless bolded Hoarseness, sore throat, dysphagia, dental problems, itching, sneezing,  nasal congestion or discharge of excess mucus or purulent secretions, ear ache,   fever, chills, sweats, unintended wt loss or wt gain, classically pleuritic or exertional cp,  orthopnea pnd or arm/hand swelling  or leg swelling, presyncope, palpitations, abdominal pain, anorexia, nausea, vomiting, diarrhea  or change in bowel habits or change in bladder habits, change in stools or change in urine, dysuria, hematuria,  rash, arthralgias, visual complaints, headache, numbness, weakness or ataxia or problems with walking or coordination,  change in mood or  memory.        Current Meds  Medication Sig  . albuterol (PROAIR HFA) 108 (90 Base) MCG/ACT inhaler Inhale 2 puffs into the lungs every 6 (six) hours as needed for wheezing or shortness of breath.  Marland Kitchen albuterol (PROVENTIL) (2.5 MG/3ML) 0.083% nebulizer solution USE 3 ML IN NEBULIZER EVERY 6 HOURS AS NEEDED FOR  WHEEZING  OR  SHORTNESS  OF  BREATH (Patient taking differently: Take 2.5 mg by nebulization every 6 (six) hours as needed for wheezing or shortness of breath. )  . Budeson-Glycopyrrol-Formoterol (BREZTRI AEROSPHERE) 160-9-4.8 MCG/ACT AERO Inhale 2 puffs into the lungs 2 (two) times daily.  . cycloSPORINE (RESTASIS) 0.05 % ophthalmic emulsion Place 1 drop into both eyes 2 (two) times daily.   . mirtazapine (REMERON) 15 MG tablet Take 1 tablet (15 mg total) by mouth at bedtime.  . OXYGEN Place 2-3 L into the nose continuous.   . valACYclovir (VALTREX) 1000 MG tablet TAKE 1 TABLET BY MOUTH TWICE DAILY FOR 3 DAYS AS NEEDED (Patient taking differently: Take 1,000 mg by mouth 2 (two) times daily as needed (for flare  up). )                  Objective:   Physical Exam   03/17/2020    100 12/06/2019    94 10/25/2019      95   04/21/2019   99  01/22/2019       94 10/21/2018    99  Wt 120 02/13/2012  > 119 03/13/2012 > 04/22/2012 117 > 07/21/2012 122> 12/02/2012  131> 03/22/2013  131 > 04/19/2013  129  > 07/19/2013 131 > 06/06/2014 129 >  11/11/2014  132> 02/28/2015   129 > 01/16/2016  115 >  02/29/2016  110 > 05/31/2016  115  > 11/21/2016   117 > 02/21/2017   107 > 06/16/2017 102 > 08/25/2017   106 > 11/26/2017 99 > 12/18/2017 >  01/15/2018  98 > 07/14/2018 104    Vital signs reviewed  03/17/2020  - Note at rest 02 sats  95% on RA      Report : full dentures   HEENT : pt wearing mask not removed for exam due to covid -19 concerns.    NECK :  without JVD/Nodes/TM/ nl carotid upstrokes bilaterally   LUNGS: no acc muscle use,  Mod barrel  contour chest wall with bilateral  Distant bs s audible wheeze and  without cough on insp or exp maneuvers and mod  Hyperresonant  to  percussion bilaterally     CV:  RRR  no s3 or murmur or increase in P2, and no edema   ABD:  soft and nontender with pos mid insp Hoover's  in the supine position. No bruits or organomegaly appreciated, bowel sounds nl  MS:     ext warm with  Muscle wasting , calf tenderness, cyanosis or clubbing No obvious joint restrictions   SKIN: warm and dry without lesions    NEURO:  alert, approp, nl sensorium with  no motor or cerebellar deficits apparent.                 Assessment & Plan:

## 2020-03-17 NOTE — Patient Instructions (Addendum)
No 02 needed at rest unless you have air hunger or sats less than 90%    Work on inhaler technique:  relax and gently blow all the way out then take a nice smooth deep breath back in, triggering the inhaler at same time you start breathing in.    Blow out thru nose. Rinse and gargle with water when done   Plan A = Automatic = Always=   Breztri Take 2 puffs first thing in am and then another 2 puffs about 12 hours later.   Work on inhaler technique:  relax and gently blow all the way out then take a nice smooth deep breath back in, triggering the inhaler at same time you start breathing in.  Hold for up to 5 seconds if you can. Blow out thru nose. Rinse and gargle with water when done    Plan B = Backup (to supplement plan A, not to replace it) Only use your albuterol inhaler as a rescue medication to be used if you can't catch your breath by resting or doing a relaxed purse lip breathing pattern.  - The less you use it, the better it will work when you need it. - Ok to use the inhaler up to 2 puffs  every 4 hours if you must but call for appointment if use goes up over your usual need - Don't leave home without it !!  (think of it like the spare tire for your car)   Plan C = Crisis (instead of Plan B but only if Plan B stops working) - only use your albuterol nebulizer if you first try Plan B and it fails to help > ok to use the nebulizer up to every 4 hours but if start needing it regularly call for immediate appointment   Plan D = Deltasone - go ahead if C not working great  - Prednisone 10 mg take  4 each am x 2 days,   2 each am x 2 days,  1 each am x 2 days and stop   Please schedule a follow up visit in 3 months but call sooner if needed

## 2020-03-18 ENCOUNTER — Encounter: Payer: Self-pay | Admitting: Internal Medicine

## 2020-03-18 NOTE — Assessment & Plan Note (Signed)
Quit smoking 06/2012 PFT performed on 06/27/2011. Poor quality.    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 prior to study with DLCO  24 % corrects to 33 % for alv volume    - referred to rehab again 11/26/2017 > could not afford it  - 10/25/2019  After extensive coaching inhaler device,  effectiveness =    75% (short ti)  Try breztri 2 bid  - Prednisone x 6 days prn as plan D 03/17/2020 >>>   Group D in terms of symptom/risk and laba/lama/ICS  therefore appropriate rx at this point >>>  Continue Breztri   I spent extra time with pt today reviewing appropriate use of albuterol for prn use on exertion with the following points: 1) saba is for relief of sob that does not improve by walking a slower pace or resting but rather if the pt does not improve after trying this first. 2) If the pt is convinced, as many are, that saba helps recover from activity faster then it's easy to tell if this is the case by re-challenging : ie stop, take the inhaler, then p 5 minutes try the exact same activity (intensity of workload) that just caused the symptoms and see if they are substantially diminished or not after saba 3) if there is an activity that reproducibly causes the symptoms, try the saba 15 min before the activity on alternate days   If in fact the saba really does help, then fine to continue to use it prn but  advised may need to look closer at the maintenance regimen being used to achieve better control of airways disease with exertion.

## 2020-03-18 NOTE — Assessment & Plan Note (Signed)
04/13/18  Patient Saturations on Room Air at Rest = 92% Patient Saturations on [employer] while Ambulating = 87% Patient Saturations on 3 pulse Liters of oxygen while Ambulating = 93%     As of 03/17/2020  = 3lpm sleeping and ambulating but sats 95% at rest  RA  Reminded should use 02 if air hunger and not take off 02 when using nebulizer  Also: Make sure you check your oxygen saturations at highest level of activity to be sure it stays over 90% and adjust upward to maintain this level if needed but remember to turn it back to previous settings when you stop (to conserve your supply).           Each maintenance medication was reviewed in detail including emphasizing most importantly the difference between maintenance and prns and under what circumstances the prns are to be triggered using an action plan format where appropriate.  Total time for H and P, chart review, counseling, teaching devices and generating customized AVS unique to this office visit / charting = 20 min

## 2020-04-15 ENCOUNTER — Emergency Department (HOSPITAL_COMMUNITY): Payer: Medicare Other

## 2020-04-15 ENCOUNTER — Emergency Department (HOSPITAL_COMMUNITY)
Admission: EM | Admit: 2020-04-15 | Discharge: 2020-04-15 | Disposition: A | Discharge status: Home / Self Care | Payer: Medicare Other | Attending: Emergency Medicine | Admitting: Emergency Medicine

## 2020-04-15 ENCOUNTER — Other Ambulatory Visit: Payer: Self-pay

## 2020-04-15 ENCOUNTER — Encounter (HOSPITAL_COMMUNITY): Payer: Self-pay | Admitting: Emergency Medicine

## 2020-04-15 DIAGNOSIS — Z79899 Other long term (current) drug therapy: Secondary | ICD-10-CM | POA: Insufficient documentation

## 2020-04-15 DIAGNOSIS — Z87891 Personal history of nicotine dependence: Secondary | ICD-10-CM | POA: Insufficient documentation

## 2020-04-15 DIAGNOSIS — R0602 Shortness of breath: Secondary | ICD-10-CM | POA: Diagnosis present

## 2020-04-15 DIAGNOSIS — J441 Chronic obstructive pulmonary disease with (acute) exacerbation: Secondary | ICD-10-CM | POA: Diagnosis not present

## 2020-04-15 LAB — BASIC METABOLIC PANEL
Anion gap: 9 (ref 5–15)
BUN: 16 mg/dL (ref 8–23)
CO2: 31 mmol/L (ref 22–32)
Calcium: 8.6 mg/dL — ABNORMAL LOW (ref 8.9–10.3)
Chloride: 100 mmol/L (ref 98–111)
Creatinine, Ser: 0.78 mg/dL (ref 0.44–1.00)
GFR calc Af Amer: >60 mL/min (ref >60)
GFR calc non Af Amer: >60 mL/min (ref >60)
Glucose, Bld: 97 mg/dL (ref 70–99)
Potassium: 3.8 mmol/L (ref 3.5–5.1)
Sodium: 140 mmol/L (ref 135–145)

## 2020-04-15 LAB — CBC WITH DIFFERENTIAL/PLATELET
Abs Immature Granulocytes: 0.08 10*3/uL — ABNORMAL HIGH (ref 0.00–0.07)
Basophils Absolute: 0 10*3/uL (ref 0.0–0.1)
Basophils Relative: 0 %
Eosinophils Absolute: 0 10*3/uL (ref 0.0–0.5)
Eosinophils Relative: 0 %
HCT: 35.1 % — ABNORMAL LOW (ref 36.0–46.0)
Hemoglobin: 10.7 g/dL — ABNORMAL LOW (ref 12.0–15.0)
Immature Granulocytes: 1 %
Lymphocytes Relative: 9 %
Lymphs Abs: 1.2 10*3/uL (ref 0.7–4.0)
MCH: 26.4 pg (ref 26.0–34.0)
MCHC: 30.5 g/dL (ref 30.0–36.0)
MCV: 86.7 fL (ref 80.0–100.0)
Monocytes Absolute: 1 10*3/uL (ref 0.1–1.0)
Monocytes Relative: 8 %
Neutro Abs: 10.6 10*3/uL — ABNORMAL HIGH (ref 1.7–7.7)
Neutrophils Relative %: 82 %
Platelets: 265 10*3/uL (ref 150–400)
RBC: 4.05 MIL/uL (ref 3.87–5.11)
RDW: 17.9 % — ABNORMAL HIGH (ref 11.5–15.5)
WBC: 13 10*3/uL — ABNORMAL HIGH (ref 4.0–10.5)
nRBC: 0 % (ref 0.0–0.2)

## 2020-04-15 MED ORDER — PREDNISONE 20 MG PO TABS: 40.0000 mg | ORAL_TABLET | Freq: Every day | ORAL | 0 refills | Status: DC

## 2020-04-15 MED ORDER — METHYLPREDNISOLONE SODIUM SUCC 125 MG IJ SOLR
80.0000 mg | Freq: Once | INTRAMUSCULAR | Status: AC
  Administered 2020-04-15: 80 mg via INTRAVENOUS
  Filled 2020-04-15: qty 2

## 2020-04-15 MED ORDER — IPRATROPIUM-ALBUTEROL 0.5-2.5 (3) MG/3ML IN SOLN
3.0000 mL | Freq: Once | RESPIRATORY_TRACT | Status: AC
  Administered 2020-04-15: 3 mL via RESPIRATORY_TRACT
  Filled 2020-04-15: qty 3

## 2020-04-15 NOTE — ED Triage Notes (Signed)
Pt here with sob with a hx of copd. Pt had talked to daughter earlier and set off her anxiety. Upon ems arrival pt had anxiety under control but still was sob. Pt states she still feels tight. Ems gave an albuterol neb. Pt felt improvement until she was taken off the neb. Pt denies CP

## 2020-04-17 NOTE — ED Provider Notes (Signed)
[facility] EMERGENCY DEPARTMENT Provider Note   CSN: [id number] Arrival date & time: 04/15/20  1834     History Chief Complaint  Patient presents with  . Shortness of Breath    Mary Jenkins is a 64 y.o. female.  HPI   64 year old female with shortness of breath.  Onset earlier today.  History of COPD.  Feels like she is having exacerbation.  Also reports being emotionally upset although she does not want to discuss specifics.  No acute pain.  No fevers or chills.  Was feeling fine when she woke up this morning.  Reports compliance with her medications.  Past Medical History:  Diagnosis Date  . Breast cancer ([place])   . Cataract, immature    bilateral  . COPD (chronic obstructive pulmonary disease) ([place])    no home O2  . Cough 02/09/2015  . DDD (degenerative disc disease), cervical   . Exertional shortness of breath    states if she "takes her time" doing activities does not get SOB  . Family history of adverse reaction to anesthesia    pt's sister has hx. of post-op N/V  . Full dentures   . GERD (gastroesophageal reflux disease) 02/03/2012  . High cholesterol    no current med.  Marland Kitchen History of breast cancer   . Personal history of chemotherapy 1997  . Runny nose 02/09/2015   clear drainage, per pt.    Patient Active Problem List   Diagnosis Date Noted  . Chills (without fever) 02/23/2020  . Shortness of breath 12/27/2019  . Screening for hyperlipidemia 12/03/2019  . Bruising 07/21/2019  . COPD exacerbation ([place]) 01/11/2019  . Healthcare maintenance 05/23/2018  . Chronic respiratory failure with hypoxia ([place]) 04/13/2018  . Weight loss 08/02/2017  . COPD with acute exacerbation ([place]) 06/16/2017  . 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 Encounter for HCV screening test for low risk patient 03/10/2017  . History of hyperkalemia 02/13/2016  . Skin lesion of back 11/15/2014  .  Atypical chest pain 10/21/2014  . Cataract 10/21/2014  . Dyslipidemia, goal to be determined 04/22/2014  . Lipoma 10/07/2012  . Cervical spine arthritis 08/27/2012  . History of breast cancer 07/23/2012  . GERD (gastroesophageal reflux disease) 02/03/2012  . COPD GOLD IV 06/24/2011  . Liver cyst 06/24/2011  . Insomnia 06/24/2011    Past Surgical History:  Procedure Laterality Date  . BREAST CAPSULOTOMY WITH IMPLANT EXCHANGE Right 06/21/2013   Procedure: REVISION RIGHT BREAST RECONSTRUCTION/REMOVAL OF RIGHT IMPLANT/RIGHT BREAST CAPSULOTOMY WITH INSERT TISSUE EXPLANDER RIGHT BREAST/POSSIBLE LEFT BREAST MASTOPEXY;  Surgeon: Cristine Polio, MD;  Location: [facility];  Service: Plastics;  Laterality: Right;  . BREAST RECONSTRUCTION Right 06/21/2013   Procedure: BREAST RECONSTRUCTION;  Surgeon: Cristine Polio, MD;  Location: [facility];  Service: Plastics;  Laterality: Right;  . BREAST RECONSTRUCTION Left 11/29/2013   Procedure: LEFT MASTOPEXY FOR RECONSTRUCTION;  Surgeon: Cristine Polio, MD;  Location: [facility];  Service: Plastics;  Laterality: Left;  . BREAST RECONSTRUCTION Right 03/14/2014   Procedure: RECONSTRUCTION NIPPLE RIGHT BREAST;  Surgeon: Cristine Polio, MD;  Location: [facility];  Service: Plastics;  Laterality: Right;  . BREAST SURGERY  97   implant  . CAPSULOTOMY Right 06/30/2017   Procedure: CAPSULOTOMY;  Surgeon: Cristine Polio, MD;  Location: [facility];  Service: Plastics;  Laterality: Right;  . COLONOSCOPY    . MASTECTOMY, PARTIAL Right 1997   -  node dissection  . REDUCTION MAMMAPLASTY Left   . REMOVAL OF TISSUE EXPANDER AND PLACEMENT OF IMPLANT Right 02/13/2015   Procedure: REMOVAL OF TISSUE  EXPANDER  PORT RIGHT BREAST;  Surgeon: Cristine Polio, MD;  Location: [facility];  Service: Plastics;  Laterality: Right;  . REMOVAL OF TISSUE EXPANDER AND PLACEMENT OF IMPLANT Right  06/30/2017   Procedure: REMOVAL OF RIGHT TISSUE EXPANDER WITH PLACEMENT OF RIGHT GELL BREAST IMPLANTS;  Surgeon: Cristine Polio, MD;  Location: [facility];  Service: Plastics;  Laterality: Right;  . SCAR REVISION Right 02/13/2015   Procedure: SCAR REVISION RIGHT BREAST;  Surgeon: Cristine Polio, MD;  Location: [facility];  Service: Plastics;  Laterality: Right;  . TISSUE EXPANDER PLACEMENT Right 03/14/2014   Procedure: SALINE REMOVAL RIGHT TISSUE EXPANDER;  Surgeon: Cristine Polio, MD;  Location: [facility];  Service: Plastics;  Laterality: Right;  . TUBAL LIGATION  1980's     OB History   No obstetric history on file.     Family History  Problem Relation Age of Onset  . Stroke Mother   . Heart disease Brother   . Heart disease Brother   . Anesthesia problems Sister        post-op N/V  . Cancer Sister        Endometrial CA  . Heart disease Brother   . Tuberculosis Daughter        not active  . Allergic rhinitis Daughter   . Allergic rhinitis Son   . Hypertension Sister   . Diabetes Sister   . Breast cancer Neg Hx     Social History   Tobacco Use  . Smoking status: Former Smoker    Quit date: 07/23/2012    Years since quitting: 7.7  . Smokeless tobacco: Never Used  Vaping Use  . Vaping Use: Never used  Substance Use Topics  . Alcohol use: No    Alcohol/week: 0.0 standard drinks    Comment: one beer every now and then  . Drug use: No    Home Medications Prior to Admission medications   Medication Sig Start Date End Date Taking? Authorizing Provider  albuterol (PROAIR HFA) 108 (90 Base) MCG/ACT inhaler Inhale 2 puffs into the lungs every 6 (six) hours as needed for wheezing or shortness of breath. 03/06/20   Matilde Haymaker, MD  albuterol (PROVENTIL) (2.5 MG/3ML) 0.083% nebulizer solution USE 3 ML IN NEBULIZER EVERY 6 HOURS AS NEEDED FOR  WHEEZING  OR  SHORTNESS  OF  BREATH Patient taking differently: Take 2.5 mg by  nebulization every 6 (six) hours as needed for wheezing or shortness of breath.  06/14/19   Tanda Rockers, MD  Budeson-Glycopyrrol-Formoterol (BREZTRI AEROSPHERE) 160-9-4.8 MCG/ACT AERO Inhale 2 puffs into the lungs 2 (two) times daily. 12/28/19   Mullis, Kiersten P, DO  cycloSPORINE (RESTASIS) 0.05 % ophthalmic emulsion Place 1 drop into both eyes 2 (two) times daily.     [provider]  mirtazapine (REMERON) 15 MG tablet Take 1 tablet (15 mg total) by mouth at bedtime. 12/03/19   Matilde Haymaker, MD  OXYGEN Place 2-3 L into the nose continuous.     [provider]  predniSONE (DELTASONE) 10 MG tablet Take  4 each am x 2 days,   2 each am x 2 days,  1 each am x 2 days and stop 03/17/20   Tanda Rockers, MD  predniSONE (DELTASONE) 20 MG tablet Take 2 tablets (40 mg total)  by mouth daily. 04/15/20   Virgel Manifold, MD  valACYclovir (VALTREX) 1000 MG tablet TAKE 1 TABLET BY MOUTH TWICE DAILY FOR 3 DAYS AS NEEDED Patient taking differently: Take 1,000 mg by mouth 2 (two) times daily as needed (for flare up).  05/24/19   Shelly Bombard, MD    Allergies    Patient has no known allergies.  Review of Systems   Review of Systems All systems reviewed and negative, other than as noted in HPI. Physical Exam Updated Vital Signs BP (!) 113/94 (BP Location: Right Arm)   Pulse 83   Temp 98.7 F (37.1 C) (Oral)   Resp 14   Ht _0  (1.651 m)   Wt 45.7 kg   SpO2 99%   BMI 16.77 kg/m   Physical Exam Vitals and nursing note reviewed.  Constitutional:      General: She is not in acute distress.    Appearance: She is well-developed.  HENT:     Head: Normocephalic and atraumatic.  Eyes:     General:        Right eye: No discharge.        Left eye: No discharge.     Conjunctiva/sclera: Conjunctivae normal.  Cardiovascular:     Rate and Rhythm: Normal rate and regular rhythm.     Heart sounds: Normal heart sounds. No murmur heard.  No friction rub. No gallop.   Pulmonary:      Effort: Pulmonary effort is normal. No respiratory distress.     Breath sounds: Wheezing present.     Comments: Speaking in complete sentences.  No increased work of breathing.  Bilateral wheezing. Abdominal:     General: There is no distension.     Palpations: Abdomen is soft.     Tenderness: There is no abdominal tenderness.  Musculoskeletal:        General: No tenderness.     Cervical back: Neck supple.  Skin:    General: Skin is warm and dry.  Neurological:     Mental Status: She is alert.  Psychiatric:        Behavior: Behavior normal.        Thought Content: Thought content normal.     ED Results / Procedures / Treatments   Labs (all labs ordered are listed, but only abnormal results are displayed) Labs Reviewed  CBC WITH DIFFERENTIAL/PLATELET - Abnormal; Notable for the following components:      Result Value   WBC 13.0 (*)    Hemoglobin 10.7 (*)    HCT 35.1 (*)    RDW 17.9 (*)    Neutro Abs 10.6 (*)    Abs Immature Granulocytes 0.08 (*)    All other components within normal limits  BASIC METABOLIC PANEL - Abnormal; Notable for the following components:   Calcium 8.6 (*)    All other components within normal limits    EKG None  Radiology No results found.   DG Chest 2 View  Result Date: 04/15/2020 CLINICAL DATA:  Dyspnea, COPD exacerbation EXAM: CHEST - 2 VIEW COMPARISON:  01/05/2020 FINDINGS: Frontal and lateral views of the chest demonstrate a stable cardiac silhouette. Lungs are hyperinflated with background emphysema. No airspace disease, effusion, or pneumothorax. No acute bony abnormalities. IMPRESSION: 1. Emphysema.  No acute process. Electronically Signed   By: Randa Ngo M.D.   On: 04/15/2020 20:19    Procedures Procedures (including critical care time)  Medications Ordered in ED Medications  ipratropium-albuterol (DUONEB) 0.5-2.5 (3) MG/3ML nebulizer solution  3 mL (3 mLs Nebulization Given 04/15/20 2000)  methylPREDNISolone sodium succinate  (SOLU-MEDROL) 125 mg/2 mL injection 80 mg (80 mg Intravenous Given 04/15/20 2000)    ED Course  I have reviewed the triage vital signs and the nursing notes.  Pertinent labs & imaging results that were available during my care of the patient were reviewed by me and considered in my medical decision making (see chart for details).    MDM Rules/Calculators/A&P                          64 year old female with dyspnea.  Likely COPD exacerbation.  O2 sats are fine on room air.  Chest x-ray without acute abnormality.  Afebrile.  Treated with bronchodilators with improvement.  Plan course of steroids.  Return precautions discussed.  Outpatient follow-up otherwise.  Final Clinical Impression(s) / ED Diagnoses Final diagnoses:  COPD exacerbation ([place])    Rx / DC Orders ED Discharge Orders         Ordered    predniSONE (DELTASONE) 20 MG tablet  Daily     Discontinue  Reprint     04/15/20 2225           Virgel Manifold, MD 04/17/20 2309

## 2020-04-21 ENCOUNTER — Other Ambulatory Visit: Payer: Self-pay

## 2020-04-21 ENCOUNTER — Emergency Department (HOSPITAL_COMMUNITY): Payer: Medicare Other

## 2020-04-21 ENCOUNTER — Other Ambulatory Visit: Payer: Self-pay | Admitting: Family Medicine

## 2020-04-21 ENCOUNTER — Inpatient Hospital Stay (HOSPITAL_COMMUNITY)
Admission: EM | Admit: 2020-04-21 | Discharge: 2020-04-25 | DRG: 190 | Disposition: A | Discharge status: Home Health | Payer: Medicare Other | Attending: Family Medicine | Admitting: Family Medicine

## 2020-04-21 DIAGNOSIS — Z9221 Personal history of antineoplastic chemotherapy: Secondary | ICD-10-CM

## 2020-04-21 DIAGNOSIS — D72829 Elevated white blood cell count, unspecified: Secondary | ICD-10-CM | POA: Diagnosis present

## 2020-04-21 DIAGNOSIS — J449 Chronic obstructive pulmonary disease, unspecified: Secondary | ICD-10-CM | POA: Diagnosis present

## 2020-04-21 DIAGNOSIS — M47812 Spondylosis without myelopathy or radiculopathy, cervical region: Secondary | ICD-10-CM | POA: Diagnosis present

## 2020-04-21 DIAGNOSIS — E785 Hyperlipidemia, unspecified: Secondary | ICD-10-CM | POA: Diagnosis present

## 2020-04-21 DIAGNOSIS — E440 Moderate protein-calorie malnutrition: Secondary | ICD-10-CM | POA: Diagnosis present

## 2020-04-21 DIAGNOSIS — J439 Emphysema, unspecified: Secondary | ICD-10-CM | POA: Diagnosis not present

## 2020-04-21 DIAGNOSIS — Z681 Body mass index (BMI) 19 or less, adult: Secondary | ICD-10-CM

## 2020-04-21 DIAGNOSIS — Z9981 Dependence on supplemental oxygen: Secondary | ICD-10-CM

## 2020-04-21 DIAGNOSIS — H268 Other specified cataract: Secondary | ICD-10-CM | POA: Diagnosis present

## 2020-04-21 DIAGNOSIS — Z8249 Family history of ischemic heart disease and other diseases of the circulatory system: Secondary | ICD-10-CM

## 2020-04-21 DIAGNOSIS — J441 Chronic obstructive pulmonary disease with (acute) exacerbation: Secondary | ICD-10-CM | POA: Diagnosis present

## 2020-04-21 DIAGNOSIS — Z9011 Acquired absence of right breast and nipple: Secondary | ICD-10-CM

## 2020-04-21 DIAGNOSIS — R1310 Dysphagia, unspecified: Secondary | ICD-10-CM

## 2020-04-21 DIAGNOSIS — D649 Anemia, unspecified: Secondary | ICD-10-CM | POA: Diagnosis present

## 2020-04-21 DIAGNOSIS — Z823 Family history of stroke: Secondary | ICD-10-CM

## 2020-04-21 DIAGNOSIS — Z853 Personal history of malignant neoplasm of breast: Secondary | ICD-10-CM

## 2020-04-21 DIAGNOSIS — R0603 Acute respiratory distress: Secondary | ICD-10-CM | POA: Diagnosis present

## 2020-04-21 DIAGNOSIS — K219 Gastro-esophageal reflux disease without esophagitis: Secondary | ICD-10-CM | POA: Diagnosis present

## 2020-04-21 DIAGNOSIS — J9621 Acute and chronic respiratory failure with hypoxia: Secondary | ICD-10-CM | POA: Diagnosis present

## 2020-04-21 DIAGNOSIS — Z7982 Long term (current) use of aspirin: Secondary | ICD-10-CM

## 2020-04-21 DIAGNOSIS — Z20822 Contact with and (suspected) exposure to covid-19: Secondary | ICD-10-CM | POA: Diagnosis present

## 2020-04-21 DIAGNOSIS — I2723 Pulmonary hypertension due to lung diseases and hypoxia: Secondary | ICD-10-CM | POA: Diagnosis present

## 2020-04-21 DIAGNOSIS — Z7951 Long term (current) use of inhaled steroids: Secondary | ICD-10-CM

## 2020-04-21 DIAGNOSIS — Z9882 Breast implant status: Secondary | ICD-10-CM

## 2020-04-21 DIAGNOSIS — Z833 Family history of diabetes mellitus: Secondary | ICD-10-CM

## 2020-04-21 DIAGNOSIS — Z87891 Personal history of nicotine dependence: Secondary | ICD-10-CM

## 2020-04-21 DIAGNOSIS — E872 Acidosis: Secondary | ICD-10-CM | POA: Diagnosis present

## 2020-04-21 DIAGNOSIS — Z8049 Family history of malignant neoplasm of other genital organs: Secondary | ICD-10-CM

## 2020-04-21 DIAGNOSIS — Z79899 Other long term (current) drug therapy: Secondary | ICD-10-CM

## 2020-04-21 DIAGNOSIS — Z1231 Encounter for screening mammogram for malignant neoplasm of breast: Secondary | ICD-10-CM

## 2020-04-21 DIAGNOSIS — R1314 Dysphagia, pharyngoesophageal phase: Secondary | ICD-10-CM | POA: Diagnosis present

## 2020-04-21 LAB — SARS CORONAVIRUS 2 BY RT PCR (HOSPITAL ORDER, PERFORMED IN ~~LOC~~ HOSPITAL LAB): SARS Coronavirus 2: NEGATIVE

## 2020-04-21 LAB — I-STAT VENOUS BLOOD GAS, ED
Acid-Base Excess: 8 mmol/L — ABNORMAL HIGH (ref 0.0–2.0)
Acid-Base Excess: 2 mmol/L (ref 0.0–2.0)
Bicarbonate: 36.2 mmol/L — ABNORMAL HIGH (ref 20.0–28.0)
Bicarbonate: 28.9 mmol/L — ABNORMAL HIGH (ref 20.0–28.0)
Calcium, Ion: 1.11 mmol/L — ABNORMAL LOW (ref 1.15–1.40)
Calcium, Ion: 1.12 mmol/L — ABNORMAL LOW (ref 1.15–1.40)
HCT: 37 % (ref 36.0–46.0)
HCT: 38 % (ref 36.0–46.0)
Hemoglobin: 12.6 g/dL (ref 12.0–15.0)
Hemoglobin: 12.9 g/dL (ref 12.0–15.0)
O2 Saturation: 70 %
O2 Saturation: 80 %
Potassium: 4.1 mmol/L (ref 3.5–5.1)
Potassium: 3.5 mmol/L (ref 3.5–5.1)
Sodium: 140 mmol/L (ref 135–145)
Sodium: 142 mmol/L (ref 135–145)
TCO2: 38 mmol/L — ABNORMAL HIGH (ref 22–32)
TCO2: 31 mmol/L (ref 22–32)
pCO2, Ven: 70.8 mmHg (ref 44.0–60.0)
pCO2, Ven: 56.4 mmHg (ref 44.0–60.0)
pH, Ven: 7.317 (ref 7.250–7.430)
pH, Ven: 7.317 (ref 7.250–7.430)
pO2, Ven: 42 mmHg (ref 32.0–45.0)
pO2, Ven: 50 mmHg — ABNORMAL HIGH (ref 32.0–45.0)

## 2020-04-21 LAB — COMPREHENSIVE METABOLIC PANEL
ALT: 20 U/L (ref 0–44)
AST: 28 U/L (ref 15–41)
Albumin: 3.2 g/dL — ABNORMAL LOW (ref 3.5–5.0)
Alkaline Phosphatase: 55 U/L (ref 38–126)
Anion gap: 10 (ref 5–15)
BUN: 11 mg/dL (ref 8–23)
CO2: 28 mmol/L (ref 22–32)
Calcium: 8.3 mg/dL — ABNORMAL LOW (ref 8.9–10.3)
Chloride: 103 mmol/L (ref 98–111)
Creatinine, Ser: 0.98 mg/dL (ref 0.44–1.00)
GFR calc Af Amer: >60 mL/min (ref >60)
GFR calc non Af Amer: >60 mL/min (ref >60)
Glucose, Bld: 175 mg/dL — ABNORMAL HIGH (ref 70–99)
Potassium: 4.2 mmol/L (ref 3.5–5.1)
Sodium: 141 mmol/L (ref 135–145)
Total Bilirubin: 0.8 mg/dL (ref 0.3–1.2)
Total Protein: 6.1 g/dL — ABNORMAL LOW (ref 6.5–8.1)

## 2020-04-21 LAB — CBC
HCT: 37.6 % (ref 36.0–46.0)
Hemoglobin: 10.9 g/dL — ABNORMAL LOW (ref 12.0–15.0)
MCH: 25.5 pg — ABNORMAL LOW (ref 26.0–34.0)
MCHC: 29 g/dL — ABNORMAL LOW (ref 30.0–36.0)
MCV: 87.9 fL (ref 80.0–100.0)
Platelets: 274 10*3/uL (ref 150–400)
RBC: 4.28 MIL/uL (ref 3.87–5.11)
RDW: 18 % — ABNORMAL HIGH (ref 11.5–15.5)
WBC: 10.6 10*3/uL — ABNORMAL HIGH (ref 4.0–10.5)
nRBC: 0 % (ref 0.0–0.2)

## 2020-04-21 MED ORDER — ALBUTEROL (5 MG/ML) CONTINUOUS INHALATION SOLN
10.0000 mg/h | INHALATION_SOLUTION | RESPIRATORY_TRACT | Status: DC
  Administered 2020-04-21: 10 mg/h via RESPIRATORY_TRACT

## 2020-04-21 NOTE — H&P (Addendum)
[facility] Admission History and Physical Service Pager: [phone]  Patient name: Mary Jenkins Medical record number: [id number] Date of birth: 1956-03-11 Age: 64 y.o. Gender: female  Primary Care Provider: Matilde Haymaker, MD Consultants: None Code Status: Full Preferred Emergency Contact: [name] [phone]  Chief Complaint: "I think I'm having an exacerbation of my emphysema"  Assessment and Plan: Mary Jenkins is a 64 y.o. female presenting with respiratory distress. PMH is significant for COPD Gold stage IV, GERD, h/o breast cancer, cervical spine arthritis, HLD, cataracts.  Acute respiratory distress  COPD Gold stage 4 Sudden onset SOB at home while lying in bed.  Felt hot and stressed, air conditioner wasn't on, which she said is usually what happens when she has trouble breathing.  Called EMS for COPD exacerbation not responding to nebulizer treatments.  Became apneic in ambulance, required about 1 minute of BVM support, responded well then placed on NRB.  Received Atrovent, Solu-Medrol, magnesium from EMS.  In ED, placed on BiPAP and given albuterol.  Good response, switched to BiPAP with SpO2 100%.  Initial VBG with compensated respiratory acidosis, pH 7.317/70.8/42/36.2.  Venous blood gas: PCO2 normalizing to 56.4, PO2 increasing, acid-base excess decreasing to 2, bicarb decreasing (last 28.9).  While in room for admission, vital signs 110s/70s, pulse 110s, SPO2 100%. Able to speak in full sentences but work of breathing increased. Wells score 1.5 given tachycardia, but of note, patient had received CAT which could also cause tachycardia.  WBC 13.0 > 10.6, hemoglobin 10.7 > 10.9 > 12.6 > 12.9.  Glucose 97 > 175.  Of note, was hospitalized 12/27/2019 for COPD exacerbation (never intubated); subsequent ED visits 4/14 and 7/24 for same problem. Has been prescribed prn prednisone burst and taper by PCP (4>2>1); however, she is continually taking one  course right after another and told us she didn't realize it was only as needed. Home meds include albuterol inhaler, albuterol nebulizer, Breztri aerosphere (budeson-glycopyrrol-formoterol), 3 L O2 [place], and prednisone. 60-75 pack-year smoking history. Differential includes COPD exacerbation vs PE vs CAP vs new acute CHF. COPD exacerbation most likely given Gold stage 4, some sputum production, and global wheezing on physical exam. Considering PE due to sudden onset, severe course, Wells score 1.5; D-dimer pending, will consider CTA chest if positive, but CTA was negative in April with similar presentation. CAP less likely d/t no fever/chills/malaise, but does have mild leukocytosis (perhaps from chronic steroids for several months). New acute CHF unlikely due to evolemic appearance, no BLE edema, no rales on physical exam. Previously, arrhythmia was also considered with abrupt onset and she was referred to cardiology for loop recorder, but states she was never called.  Given her lung exam today and known COPD, this is less likely, although could consider loop recorder again. - admit to med tele floor with Dr. Nori Riis attending - f/u D-dimer - AM BMP/CBC - consider CTA chest r/o PE pending D-dimer - follows with Dr. Christinia Gully for at [place] ([place] in AM) -Albuterol nebulizer every 2 as needed -Continue budeson-glycopyrrol-formoterol 2 puffs twice daily -DuoNeb every 4 -Prednisone 40 mg daily x5 days -Ceftriaxone 1 g daily x5 days (7/31- ) - continuous pulse ox - O2 therapy prn, goal sats 88-92% - educated patient that it seems her steroid tapers were to be used prn, not continuously  GERD  Dysphagia No home meds.  Complains of feeling that she cannot swallow sometimes.  Says that sometimes she feels as though food or pill will  be caught in her throat and she needs to drink water to let it "go down".  Denies choking on her food. -Speech consult for swallow eval -Consider Tums or  Protonix  Cervical spine arthritis Home meds include aspirin 81.  No complaints regarding neck pain upon admission. - pain relief prn  HLD No home meds.  Lipid panel obtained 12/03/2019 all within normal limits. -No action at this time  Cataracts Home meds include cyclosporine drops.  -Continue eyedrops   FEN/GI: Regular diet Prophylaxis: lovenox (CrCl 42.4 mL/min)  Disposition: Med tele  History of Present Illness:  Mary Jenkins is a 64 y.o. female presenting with acute respiratory distress.  Patient reports that she was sitting in her bed today when she started to feel "upset" and got up to look out the window.  Got very hot and states that she didn't have her air on in the house.  Sat down, tried to use her nebulizer machine.  She states that she was not seeing improvement in her breathing and had her daughter call EMS.  En route with EMS, she was placed on NRB, because apneic, then placed on CPAP.  Given albuterol, mag, atrovent, and solumedrol.  In ED, patient was on NRB and continued to have increased WOB.  CO2 on VBG was in 70s.  She was transitioned to BiPAP and improved over time.  VBG improved and patient was able to be weaned to 2L per [place].    Has been on steroids consistently since 6/25 because she thought that Dr. Melvyn Novas wanted her to take tapers over and over again.    She was seen in ED for similar symptoms on 7/24, she was prescribed a steroid taper then as well, and she didn't fill it because she was already taking it from her pulmonologist.  She had yellow phlegm at that time, but has since improved.  Most recent hospitalization was in 12/27/2019.  She was referred to cardiology but states that no one ever called her.  At that time, there was concern for arrhythmia and wanted to have her referred for loop recorder.  Usually when this happens, she is able to improve her symptoms if she can turn her air conditioning on, given herself and breathing treatment, "and  calm down."  Reports 1-2x in a week she will have chest pain on left side right above breast.  No radiation.  Usually happens when lying in bed.  Comes and goes.  Sharp, but dull, "I know it's there."  Nothing makes it better, nothing makes it worse.  Can't make it come on.  Never happens when walking around.  No chest pain associated with any difficulty breathing.  She has never been intubated.  Patient denies any history of allergies.  She was a lifelong smoker for over 30 years, quit smoking in 2012.  She was smoking 2.5 ppd at that time.   Reports that sometimes she will be sitting in bed and feel like she has trouble swallowing, but it will get better quickly.  She thinks that she has never choked on food.  Review Of Systems: Per HPI with the following additions:  Review of Systems  Constitutional: Positive for chills (few days ago, went away). Negative for fatigue and fever.  HENT: Negative for congestion and rhinorrhea.   Eyes: Positive for visual disturbance (film over eyes, thinks it is cataracts and told that by doctors).  Respiratory: Positive for cough and shortness of breath.  Yellow phlegm on 7/24  Cardiovascular: Positive for chest pain. Negative for leg swelling.       Thinks her right foot was a little swollen a few weeks ago, but improved, no leg swelling  Gastrointestinal: Negative for constipation, diarrhea, nausea and vomiting.  Genitourinary: Negative for dysuria, frequency, hematuria and urgency.  Neurological: Negative for dizziness and headaches.     Patient Active Problem List   Diagnosis Date Noted  . Chills (without fever) 02/23/2020  . Shortness of breath 12/27/2019  . Screening for hyperlipidemia 12/03/2019  . Bruising 07/21/2019  . COPD exacerbation ([place]) 01/11/2019  . Healthcare maintenance 05/23/2018  . Chronic respiratory failure with hypoxia ([place]) 04/13/2018  . Weight loss 08/02/2017  . COPD with acute exacerbation ([place]) 06/16/2017  .  Severe episode of recurrent major depressive disorder, without psychotic features ([place]) 04/25/2017  . Encounter for HCV screening test for low risk patient 03/10/2017  . History of hyperkalemia 02/13/2016  . Skin lesion of back 11/15/2014  . Atypical chest pain 10/21/2014  . Cataract 10/21/2014  . Dyslipidemia, goal to be determined 04/22/2014  . Lipoma 10/07/2012  . Cervical spine arthritis 08/27/2012  . History of breast cancer 07/23/2012  . GERD (gastroesophageal reflux disease) 02/03/2012  . COPD GOLD IV 06/24/2011  . Liver cyst 06/24/2011  . Insomnia 06/24/2011    Past Medical History: Past Medical History:  Diagnosis Date  . Breast cancer ([place])   . Cataract, immature    bilateral  . COPD (chronic obstructive pulmonary disease) ([place])    no home O2  . Cough 02/09/2015  . DDD (degenerative disc disease), cervical   . Exertional shortness of breath    states if she "takes her time" doing activities does not get SOB  . Family history of adverse reaction to anesthesia    pt's sister has hx. of post-op N/V  . Full dentures   . GERD (gastroesophageal reflux disease) 02/03/2012  . High cholesterol    no current med.  Marland Kitchen History of breast cancer   . Personal history of chemotherapy 1997  . Runny nose 02/09/2015   clear drainage, per pt.    Past Surgical History: Past Surgical History:  Procedure Laterality Date  . BREAST CAPSULOTOMY WITH IMPLANT EXCHANGE Right 06/21/2013   Procedure: REVISION RIGHT BREAST RECONSTRUCTION/REMOVAL OF RIGHT IMPLANT/RIGHT BREAST CAPSULOTOMY WITH INSERT TISSUE EXPLANDER RIGHT BREAST/POSSIBLE LEFT BREAST MASTOPEXY;  Surgeon: Cristine Polio, MD;  Location: [facility];  Service: Plastics;  Laterality: Right;  . BREAST RECONSTRUCTION Right 06/21/2013   Procedure: BREAST RECONSTRUCTION;  Surgeon: Cristine Polio, MD;  Location: [facility];  Service: Plastics;  Laterality: Right;  . BREAST RECONSTRUCTION Left 11/29/2013    Procedure: LEFT MASTOPEXY FOR RECONSTRUCTION;  Surgeon: Cristine Polio, MD;  Location: [facility];  Service: Plastics;  Laterality: Left;  . BREAST RECONSTRUCTION Right 03/14/2014   Procedure: RECONSTRUCTION NIPPLE RIGHT BREAST;  Surgeon: Cristine Polio, MD;  Location: [facility];  Service: Plastics;  Laterality: Right;  . BREAST SURGERY  97   implant  . CAPSULOTOMY Right 06/30/2017   Procedure: CAPSULOTOMY;  Surgeon: Cristine Polio, MD;  Location: [facility];  Service: Plastics;  Laterality: Right;  . COLONOSCOPY    . MASTECTOMY, PARTIAL Right 1997   -node dissection  . REDUCTION MAMMAPLASTY Left   . REMOVAL OF TISSUE EXPANDER AND PLACEMENT OF IMPLANT Right 02/13/2015   Procedure: REMOVAL OF TISSUE  EXPANDER  PORT RIGHT BREAST;  Surgeon: Cristine Polio, MD;  Location: [facility];  Service: Plastics;  Laterality: Right;  . REMOVAL OF TISSUE EXPANDER AND PLACEMENT OF IMPLANT Right 06/30/2017   Procedure: REMOVAL OF RIGHT TISSUE EXPANDER WITH PLACEMENT OF RIGHT GELL BREAST IMPLANTS;  Surgeon: Cristine Polio, MD;  Location: [facility];  Service: Plastics;  Laterality: Right;  . SCAR REVISION Right 02/13/2015   Procedure: SCAR REVISION RIGHT BREAST;  Surgeon: Cristine Polio, MD;  Location: [facility];  Service: Plastics;  Laterality: Right;  . TISSUE EXPANDER PLACEMENT Right 03/14/2014   Procedure: SALINE REMOVAL RIGHT TISSUE EXPANDER;  Surgeon: Cristine Polio, MD;  Location: [facility];  Service: Plastics;  Laterality: Right;  . TUBAL LIGATION  1980's    Social History: Social History   Tobacco Use  . Smoking status: Former Smoker    Quit date: 07/23/2012    Years since quitting: 7.7  . Smokeless tobacco: Never Used  Vaping Use  . Vaping Use: Never used  Substance Use Topics  . Alcohol use: No    Alcohol/week: 0.0 standard drinks    Comment: one beer every now and then   . 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  Smoked 2-2.5 ppd for 30 years. Quit 2012 after emphysema diagnosis.  Used to smoke marijuana, stopped 2012.  No hx heavy ETOH, used to be social drinker. Last drink 2018 or so.  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  Please also refer to relevant sections of EMR.  Family History: Family History  Problem Relation Age of Onset  . Stroke Mother   . Heart disease Brother   . Heart disease Brother   . Anesthesia problems Sister        post-op N/V  . Cancer Sister        Endometrial CA  . Heart disease Brother   . Tuberculosis Daughter        not active  . Allergic rhinitis Daughter   . Allergic rhinitis Son   . Hypertension Sister   . Diabetes Sister   . Breast cancer Neg Hx      Allergies and Medications: No Known Allergies No current facility-administered medications on file prior to encounter.   Current Outpatient Medications on File Prior to Encounter  Medication Sig Dispense Refill  . albuterol (PROAIR HFA) 108 (90 Base) MCG/ACT inhaler Inhale 2 puffs into the lungs every 6 (six) hours as needed for wheezing or shortness of breath. 18 g 3  . albuterol (PROVENTIL) (2.5 MG/3ML) 0.083% nebulizer solution USE 3 ML IN NEBULIZER EVERY 6 HOURS AS NEEDED FOR  WHEEZING  OR  SHORTNESS  OF  BREATH (Patient taking differently: Take 2.5 mg by nebulization every 6 (six) hours as needed for wheezing or shortness of breath. ) 150 mL 1  . aspirin EC 81 MG tablet Take 81 mg by mouth daily. Swallow whole.    . Budeson-Glycopyrrol-Formoterol (BREZTRI AEROSPHERE) 160-9-4.8 MCG/ACT AERO Inhale 2 puffs into the lungs 2 (two) times daily. 10.7 g 0  . cycloSPORINE (RESTASIS) 0.05 % ophthalmic emulsion Place 1 drop into both eyes 2 (two) times daily.     . diphenhydrAMINE HCl, Sleep, (ZZZQUIL PO) Take 2 capsules by mouth at  bedtime as needed (sleep).    . mirtazapine (REMERON) 15 MG tablet Take 1 tablet (15 mg total) by mouth at bedtime. 60 tablet 2  .  OXYGEN Place 3 L into the nose continuous.     . predniSONE (DELTASONE) 10 MG tablet Take  4 each am x 2 days,   2 each am x 2 days,  1 each am x 2 days and stop (Patient taking differently: Take 10-40 mg by mouth See admin instructions. Tapered course ordered 03/17/2020 (with 11 refills): take 4 tablets (40 mg) by mouth daily for 2 days, then take 2 tablets (20 mg) daily for 2 days, then take 1 tablet (20 mg) for 2 days, then stop.) 14 tablet 11  . valACYclovir (VALTREX) 1000 MG tablet TAKE 1 TABLET BY MOUTH TWICE DAILY FOR 3 DAYS AS NEEDED (Patient taking differently: Take 1,000 mg by mouth 2 (two) times daily as needed (for flare up). ) 30 tablet 5  . predniSONE (DELTASONE) 20 MG tablet Take 2 tablets (40 mg total) by mouth daily. (Patient not taking: Reported on 04/21/2020) 10 tablet 0    Objective: BP 106/76   Pulse (!) 112   Temp (!) 97.1 F (36.2 C) (Tympanic)   Resp 17   Ht _0  (1.651 m)   Wt 45.7 kg   SpO2 100%   BMI 16.77 kg/m   Exam: General: Awake, alert, increased work of breathing Eyes: EOM intact ENTM: Oral mucosa pink and moist, intact dentition Neck: No JVD, trachea midline Cardiovascular: Tachycardic but with normal rhythm, no murmurs auscultated, 2+ dorsalis pedis pulses Respiratory: Decreased air movement in all fields Abdomen: Soft, nondistended, no TTP, no rebound tenderness Extremities: No BLE edema Neuro: Cranial nerves II through X grossly intact, can move all extremities spontaneously, equal shoulder shrug Psych: Normal insight and judgment  Labs and Imaging: CBC BMET  Recent Labs  Lab 04/21/20 1752 04/21/20 1806 04/21/20 2132  WBC 10.6*  --   --   HGB 10.9*   < > 12.9  HCT 37.6   < > 38.0  PLT 274  --   --    < > = values in this interval not displayed.   Recent Labs  Lab 04/21/20 1752 04/21/20 1806 04/21/20 2132   NA 141   < > 142  K 4.2   < > 3.5  CL 103  --   --   CO2 28  --   --   BUN 11  --   --   CREATININE 0.98  --   --   GLUCOSE 175*  --   --   CALCIUM 8.3*  --   --    < > = values in this interval not displayed.     EKG: Sinus tachycardia at 118, QTC 436   Ezequiel Essex, MD 04/22/2020, 12:34 AM PGY-1, [facility] pager: [phone], text pages welcome  FPTS Upper-Level Resident Addendum   I have independently interviewed and examined the patient. I have discussed the above with the original author and agree with their documentation. My edits for correction/addition/clarification are in green. Please see also any attending notes.   [place] Constable, D.O. PGY-3, [facility] 04/22/2020 12:44 AM  FPTS Service pager: [phone] (text pages welcome through [facility])

## 2020-04-21 NOTE — ED Triage Notes (Signed)
Pt here from home in respiratory distress. Acute onset today, was using her home nebulizer on EMS arrival. Pt then started agonally breathing, assisted ventilations via BVM by EMS for approx 1 min. Started on CPAP in route but arrives to ED room on NRB d/t hospital protocol.

## 2020-04-21 NOTE — ED Provider Notes (Signed)
[facility] EMERGENCY DEPARTMENT Provider Note   CSN: [id number] Arrival date & time: 04/21/20  1720     History Chief Complaint  Patient presents with  . Respiratory Distress    Mary Jenkins is a 64 y.o. female w PMHx who presents via EMS in respiratory distress. EMS unable to get accurate SpO2 due to patient nails. Patient tachycardic with decreased air movement. Per EMS patient became apneic for approximately 1 minute requiring breath with BVM.  Patient improved and arrived on nonrebreather.  Patient was not placed on CPAP during transport due to AMS. PTA patient received albuterol, atrovent, solumedrol, Mag.   The history is provided by the patient, the EMS personnel and medical records. The history is limited by the condition of the patient.  Shortness of Breath Severity:  Severe Timing:  Constant Progression:  Improving Chronicity:  New Context: not URI   Associated symptoms: wheezing   Associated symptoms: no abdominal pain, no chest pain, no cough, no fever, no headaches, no rash, no sore throat, no sputum production and no vomiting   Risk factors comment:  History of emphysema.      Past Medical History:  Diagnosis Date  . Breast cancer ([place])   . Cataract, immature    bilateral  . COPD (chronic obstructive pulmonary disease) ([place])    no home O2  . Cough 02/09/2015  . DDD (degenerative disc disease), cervical   . Exertional shortness of breath    states if she "takes her time" doing activities does not get SOB  . Family history of adverse reaction to anesthesia    pt's sister has hx. of post-op N/V  . Full dentures   . GERD (gastroesophageal reflux disease) 02/03/2012  . High cholesterol    no current med.  Marland Kitchen History of breast cancer   . Personal history of chemotherapy 1997  . Runny nose 02/09/2015   clear drainage, per pt.    Patient Active Problem List   Diagnosis Date Noted  . Chills (without fever) 02/23/2020  . Shortness of  breath 12/27/2019  . Screening for hyperlipidemia 12/03/2019  . Bruising 07/21/2019  . COPD exacerbation ([place]) 01/11/2019  . Healthcare maintenance 05/23/2018  . Chronic respiratory failure with hypoxia ([place]) 04/13/2018  . Weight loss 08/02/2017  . COPD with acute exacerbation ([place]) 06/16/2017  . Severe episode of recurrent major depressive disorder, without psychotic features ([place]) 04/25/2017  . Encounter for HCV screening test for low risk patient 03/10/2017  . History of hyperkalemia 02/13/2016  . Skin lesion of back 11/15/2014  . Atypical chest pain 10/21/2014  . Cataract 10/21/2014  . Dyslipidemia, goal to be determined 04/22/2014  . Lipoma 10/07/2012  . Cervical spine arthritis 08/27/2012  . History of breast cancer 07/23/2012  . GERD (gastroesophageal reflux disease) 02/03/2012  . COPD GOLD IV 06/24/2011  . Liver cyst 06/24/2011  . Insomnia 06/24/2011    Past Surgical History:  Procedure Laterality Date  . BREAST CAPSULOTOMY WITH IMPLANT EXCHANGE Right 06/21/2013   Procedure: REVISION RIGHT BREAST RECONSTRUCTION/REMOVAL OF RIGHT IMPLANT/RIGHT BREAST CAPSULOTOMY WITH INSERT TISSUE EXPLANDER RIGHT BREAST/POSSIBLE LEFT BREAST MASTOPEXY;  Surgeon: Cristine Polio, MD;  Location: [facility];  Service: Plastics;  Laterality: Right;  . BREAST RECONSTRUCTION Right 06/21/2013   Procedure: BREAST RECONSTRUCTION;  Surgeon: Cristine Polio, MD;  Location: [facility];  Service: Plastics;  Laterality: Right;  . BREAST RECONSTRUCTION Left 11/29/2013   Procedure: LEFT MASTOPEXY FOR RECONSTRUCTION;  Surgeon: Cristine Polio, MD;  Location: [facility];  Service: Plastics;  Laterality: Left;  . BREAST RECONSTRUCTION Right 03/14/2014   Procedure: RECONSTRUCTION NIPPLE RIGHT BREAST;  Surgeon: Cristine Polio, MD;  Location: [facility];  Service: Plastics;  Laterality: Right;  . BREAST SURGERY  97   implant  . CAPSULOTOMY Right 06/30/2017    Procedure: CAPSULOTOMY;  Surgeon: Cristine Polio, MD;  Location: [facility];  Service: Plastics;  Laterality: Right;  . COLONOSCOPY    . MASTECTOMY, PARTIAL Right 1997   -node dissection  . REDUCTION MAMMAPLASTY Left   . REMOVAL OF TISSUE EXPANDER AND PLACEMENT OF IMPLANT Right 02/13/2015   Procedure: REMOVAL OF TISSUE  EXPANDER  PORT RIGHT BREAST;  Surgeon: Cristine Polio, MD;  Location: [place] [facility];  Service: Plastics;  Laterality: Right;  . REMOVAL OF TISSUE EXPANDER AND PLACEMENT OF IMPLANT Right 06/30/2017   Procedure: REMOVAL OF RIGHT TISSUE EXPANDER WITH PLACEMENT OF RIGHT GELL BREAST IMPLANTS;  Surgeon: Cristine Polio, MD;  Location: [facility];  Service: Plastics;  Laterality: Right;  . SCAR REVISION Right 02/13/2015   Procedure: SCAR REVISION RIGHT BREAST;  Surgeon: Cristine Polio, MD;  Location: [facility];  Service: Plastics;  Laterality: Right;  . TISSUE EXPANDER PLACEMENT Right 03/14/2014   Procedure: SALINE REMOVAL RIGHT TISSUE EXPANDER;  Surgeon: Cristine Polio, MD;  Location: [facility];  Service: Plastics;  Laterality: Right;  . TUBAL LIGATION  1980's     OB History   No obstetric history on file.     Family History  Problem Relation Age of Onset  . Stroke Mother   . Heart disease Brother   . Heart disease Brother   . Anesthesia problems Sister        post-op N/V  . Cancer Sister        Endometrial CA  . Heart disease Brother   . Tuberculosis Daughter        not active  . Allergic rhinitis Daughter   . Allergic rhinitis Son   . Hypertension Sister   . Diabetes Sister   . Breast cancer Neg Hx     Social History   Tobacco Use  . Smoking status: Former Smoker    Quit date: 07/23/2012    Years since quitting: 7.7  . Smokeless tobacco: Never Used  Vaping Use  . Vaping Use: Never used  Substance Use Topics  . Alcohol use: No    Alcohol/week: 0.0 standard drinks     Comment: one beer every now and then  . Drug use: No    Home Medications Prior to Admission medications   Medication Sig Start Date End Date Taking? Authorizing Provider  albuterol (PROAIR HFA) 108 (90 Base) MCG/ACT inhaler Inhale 2 puffs into the lungs every 6 (six) hours as needed for wheezing or shortness of breath. 03/06/20  Yes Matilde Haymaker, MD  albuterol (PROVENTIL) (2.5 MG/3ML) 0.083% nebulizer solution USE 3 ML IN NEBULIZER EVERY 6 HOURS AS NEEDED FOR  WHEEZING  OR  SHORTNESS  OF  BREATH Patient taking differently: Take 2.5 mg by nebulization every 6 (six) hours as needed for wheezing or shortness of breath.  06/14/19  Yes Tanda Rockers, MD  aspirin EC 81 MG tablet Take 81 mg by mouth daily. Swallow whole.   Yes [provider]  Budeson-Glycopyrrol-Formoterol (BREZTRI AEROSPHERE) 160-9-4.8 MCG/ACT AERO Inhale 2 puffs into the lungs 2 (two) times daily. 12/28/19  Yes Mullis, Kiersten P, DO  cycloSPORINE (RESTASIS)  0.05 % ophthalmic emulsion Place 1 drop into both eyes 2 (two) times daily.    Yes [provider]  diphenhydrAMINE HCl, Sleep, (ZZZQUIL PO) Take 2 capsules by mouth at bedtime as needed (sleep).   Yes [provider]  mirtazapine (REMERON) 15 MG tablet Take 1 tablet (15 mg total) by mouth at bedtime. 12/03/19  Yes Matilde Haymaker, MD  OXYGEN Place 3 L into the nose continuous.    Yes [provider]  predniSONE (DELTASONE) 10 MG tablet Take  4 each am x 2 days,   2 each am x 2 days,  1 each am x 2 days and stop Patient taking differently: Take 10-40 mg by mouth See admin instructions. Tapered course ordered 03/17/2020 (with 11 refills): take 4 tablets (40 mg) by mouth daily for 2 days, then take 2 tablets (20 mg) daily for 2 days, then take 1 tablet (20 mg) for 2 days, then stop. 03/17/20  Yes Tanda Rockers, MD  valACYclovir (VALTREX) 1000 MG tablet TAKE 1 TABLET BY MOUTH TWICE DAILY FOR 3 DAYS AS NEEDED Patient taking differently: Take 1,000 mg by  mouth 2 (two) times daily as needed (for flare up).  05/24/19  Yes Shelly Bombard, MD  predniSONE (DELTASONE) 20 MG tablet Take 2 tablets (40 mg total) by mouth daily. Patient not taking: Reported on 04/21/2020 04/15/20   Virgel Manifold, MD    Allergies    Patient has no known allergies.  Review of Systems   Review of Systems  Unable to perform ROS: Acuity of condition  Constitutional: Negative for chills and fever.  HENT: Negative for congestion, rhinorrhea and sore throat.   Respiratory: Positive for shortness of breath and wheezing. Negative for cough and sputum production.   Cardiovascular: Negative for chest pain.  Gastrointestinal: Negative for abdominal pain and vomiting.  Skin: Negative for color change and rash.  Neurological: Negative for syncope and headaches.  Psychiatric/Behavioral: Negative.     Physical Exam Updated Vital Signs BP (!) 112/62   Pulse (!) 109   Temp (!) 97.1 F (36.2 C) (Tympanic)   Resp 18   Ht _0  (1.651 m)   Wt 45.7 kg   SpO2 100%   BMI 16.77 kg/m   Physical Exam Vitals and nursing note reviewed.  Constitutional:      General: She is in acute distress.     Appearance: She is underweight. She is ill-appearing.  HENT:     Head: Normocephalic and atraumatic.     Nose: Nose normal.  Eyes:     Extraocular Movements: Extraocular movements intact.     Conjunctiva/sclera: Conjunctivae normal.  Cardiovascular:     Rate and Rhythm: Regular rhythm. Tachycardia present.     Heart sounds: Normal heart sounds. No murmur heard.   Pulmonary:     Effort: Respiratory distress present.     Breath sounds: No stridor. Wheezing present.  Abdominal:     Palpations: Abdomen is soft.     Tenderness: There is no abdominal tenderness. There is no guarding or rebound.  Musculoskeletal:     Right lower leg: No edema.     Left lower leg: No edema.  Skin:    General: Skin is warm.     Findings: No rash.  Neurological:     General: No focal deficit  present.     Mental Status: She is alert. Mental status is at baseline.  Psychiatric:        Mood and Affect: Mood  normal.        Behavior: Behavior normal. Behavior is cooperative.     ED Results / Procedures / Treatments   Labs (all labs ordered are listed, but only abnormal results are displayed) Labs Reviewed  CBC - Abnormal; Notable for the following components:      Result Value   WBC 10.6 (*)    Hemoglobin 10.9 (*)    MCH 25.5 (*)    MCHC 29.0 (*)    RDW 18.0 (*)    All other components within normal limits  COMPREHENSIVE METABOLIC PANEL - Abnormal; Notable for the following components:   Glucose, Bld 175 (*)    Calcium 8.3 (*)    Total Protein 6.1 (*)    Albumin 3.2 (*)    All other components within normal limits  I-STAT VENOUS BLOOD GAS, ED - Abnormal; Notable for the following components:   pCO2, Ven 70.8 (*)    Bicarbonate 36.2 (*)    TCO2 38 (*)    Acid-Base Excess 8.0 (*)    Calcium, Ion 1.11 (*)    All other components within normal limits  I-STAT VENOUS BLOOD GAS, ED - Abnormal; Notable for the following components:   pO2, Ven 50.0 (*)    Bicarbonate 28.9 (*)    Calcium, Ion 1.12 (*)    All other components within normal limits  SARS CORONAVIRUS 2 BY RT PCR (HOSPITAL ORDER, [facility])  URINALYSIS, ROUTINE W REFLEX MICROSCOPIC    EKG None  Radiology DG Chest Portable 1 View  Result Date: 04/21/2020 CLINICAL DATA:  Shortness of breath EXAM: PORTABLE CHEST 1 VIEW COMPARISON:  None. FINDINGS: The heart size and mediastinal contours are within normal limits. There is hyperinflation of the upper lung zones with flattening of the hemidiaphragms. No large airspace consolidation or pleural effusion. Surgical clips are seen within the right axilla. The visualized skeletal structures are unremarkable. IMPRESSION: No active disease.  Findings of COPD. Electronically Signed   By: Prudencio Pair M.D.   On: 04/21/2020 19:07     Procedures Procedures (including critical care time)  Medications Ordered in ED Medications  albuterol (PROVENTIL,VENTOLIN) solution continuous neb (10 mg/hr Nebulization New Bag/Given 04/21/20 1727)    ED Course  I have reviewed the triage vital signs and the nursing notes.  Pertinent labs & imaging results that were available during my care of the patient were reviewed by me and considered in my medical decision making (see chart for details).    MDM Rules/Calculators/A&P                           Medical Decision Making: Mary Jenkins is a 64 y.o. female who presented to the ED today with respiratory distress. Pt with PMHx significant for COPD, remote breast cancer in remission. Pt denies cardiac history. Pt arrives via EMS in respiratory distress on non-rebreather. Pt apneic for ~12mn during transport requiring ventilation with BVM. PTA patient received albuterol, atrovent, solumedrol, Mag. Pt was immediately assessed by myself and supervising physician. Pt afebrile, tachycardic to HR 100s with 100% spO2 on non-rebreather. On exam pt with faint expiratory wheezing however likely decreased wheezing due to very reduce air movement in all lung fields. No LE edema noted. Patient quickly placed on BiPAP with duo-neb.  Obstructive lung disease exacerbation most likely etiology of presenting symptoms with markedly decreased air movement in all lung fields however wheezing appreciated in upper lung fields. Initial  VBG consistent with COPD exacerbation with pCO2 elevated to 70.8.Patient denied cardiac history, patient without chest pain at this time. Infectious pneumonia secondary to bacterial versus viral URI could be etiology of symptoms, however patient afebrile, no leukocytosis, no reported URI symptoms, CXR wo evidence of consolidation/infection. COVID negative.  Upon reassessing patient, with improving symptoms and de-escalated to nasal cannula 2L w SpO2 100%. Patient with  significantly improved air movement in all lung fields.  Repeat VBG obtained that demonstrated improvement with pCO2 56.4. Based on the above findings, I believe patient requires admission for continued management of acute COPD exacerbation. Patient admitted to family medicine service for further management and care.   Final Clinical Impression(s) / ED Diagnoses Final diagnoses:  COPD exacerbation [facility])    Rx / DC Orders ED Discharge Orders    None       Kennyth Lose, MD 04/21/20 2316    Little, Wenda Overland, MD 04/25/20 0149    Rex Kras, Wenda Overland, MD 04/25/20 [phone]

## 2020-04-21 NOTE — ED Notes (Signed)
Admitting doctor at  The bedside 

## 2020-04-22 ENCOUNTER — Encounter (HOSPITAL_COMMUNITY): Payer: Self-pay | Admitting: Family Medicine

## 2020-04-22 ENCOUNTER — Telehealth: Payer: Self-pay | Admitting: Pulmonary Disease

## 2020-04-22 DIAGNOSIS — Z9981 Dependence on supplemental oxygen: Secondary | ICD-10-CM | POA: Diagnosis not present

## 2020-04-22 DIAGNOSIS — E872 Acidosis: Secondary | ICD-10-CM | POA: Diagnosis present

## 2020-04-22 DIAGNOSIS — Z79899 Other long term (current) drug therapy: Secondary | ICD-10-CM | POA: Diagnosis not present

## 2020-04-22 DIAGNOSIS — Z7982 Long term (current) use of aspirin: Secondary | ICD-10-CM | POA: Diagnosis not present

## 2020-04-22 DIAGNOSIS — H268 Other specified cataract: Secondary | ICD-10-CM | POA: Diagnosis present

## 2020-04-22 DIAGNOSIS — Z9221 Personal history of antineoplastic chemotherapy: Secondary | ICD-10-CM | POA: Diagnosis not present

## 2020-04-22 DIAGNOSIS — R0603 Acute respiratory distress: Secondary | ICD-10-CM | POA: Diagnosis not present

## 2020-04-22 DIAGNOSIS — Z20822 Contact with and (suspected) exposure to covid-19: Secondary | ICD-10-CM | POA: Diagnosis present

## 2020-04-22 DIAGNOSIS — E785 Hyperlipidemia, unspecified: Secondary | ICD-10-CM | POA: Diagnosis present

## 2020-04-22 DIAGNOSIS — R1314 Dysphagia, pharyngoesophageal phase: Secondary | ICD-10-CM | POA: Diagnosis present

## 2020-04-22 DIAGNOSIS — Z7951 Long term (current) use of inhaled steroids: Secondary | ICD-10-CM | POA: Diagnosis not present

## 2020-04-22 DIAGNOSIS — K219 Gastro-esophageal reflux disease without esophagitis: Secondary | ICD-10-CM | POA: Diagnosis present

## 2020-04-22 DIAGNOSIS — I2723 Pulmonary hypertension due to lung diseases and hypoxia: Secondary | ICD-10-CM | POA: Diagnosis present

## 2020-04-22 DIAGNOSIS — J439 Emphysema, unspecified: Secondary | ICD-10-CM | POA: Diagnosis present

## 2020-04-22 DIAGNOSIS — Z8249 Family history of ischemic heart disease and other diseases of the circulatory system: Secondary | ICD-10-CM | POA: Diagnosis not present

## 2020-04-22 DIAGNOSIS — Z823 Family history of stroke: Secondary | ICD-10-CM | POA: Diagnosis not present

## 2020-04-22 DIAGNOSIS — J449 Chronic obstructive pulmonary disease, unspecified: Secondary | ICD-10-CM | POA: Diagnosis not present

## 2020-04-22 DIAGNOSIS — D649 Anemia, unspecified: Secondary | ICD-10-CM | POA: Diagnosis present

## 2020-04-22 DIAGNOSIS — Z9011 Acquired absence of right breast and nipple: Secondary | ICD-10-CM | POA: Diagnosis not present

## 2020-04-22 DIAGNOSIS — Z681 Body mass index (BMI) 19 or less, adult: Secondary | ICD-10-CM | POA: Diagnosis not present

## 2020-04-22 DIAGNOSIS — M47812 Spondylosis without myelopathy or radiculopathy, cervical region: Secondary | ICD-10-CM | POA: Diagnosis present

## 2020-04-22 DIAGNOSIS — J441 Chronic obstructive pulmonary disease with (acute) exacerbation: Secondary | ICD-10-CM

## 2020-04-22 DIAGNOSIS — Z853 Personal history of malignant neoplasm of breast: Secondary | ICD-10-CM | POA: Diagnosis not present

## 2020-04-22 DIAGNOSIS — E440 Moderate protein-calorie malnutrition: Secondary | ICD-10-CM | POA: Diagnosis present

## 2020-04-22 DIAGNOSIS — J9621 Acute and chronic respiratory failure with hypoxia: Secondary | ICD-10-CM | POA: Diagnosis present

## 2020-04-22 DIAGNOSIS — D72829 Elevated white blood cell count, unspecified: Secondary | ICD-10-CM | POA: Diagnosis present

## 2020-04-22 DIAGNOSIS — R1310 Dysphagia, unspecified: Secondary | ICD-10-CM | POA: Diagnosis not present

## 2020-04-22 DIAGNOSIS — Z833 Family history of diabetes mellitus: Secondary | ICD-10-CM | POA: Diagnosis not present

## 2020-04-22 LAB — URINALYSIS, ROUTINE W REFLEX MICROSCOPIC
Bacteria, UA: NONE SEEN
Bilirubin Urine: NEGATIVE
Glucose, UA: NEGATIVE mg/dL
Ketones, ur: NEGATIVE mg/dL
Leukocytes,Ua: NEGATIVE
Nitrite: NEGATIVE
Protein, ur: NEGATIVE mg/dL
Specific Gravity, Urine: 1.006 (ref 1.005–1.030)
pH: 7 (ref 5.0–8.0)

## 2020-04-22 LAB — RESPIRATORY PANEL BY PCR

## 2020-04-22 LAB — CBC
HCT: 36.5 % (ref 36.0–46.0)
Hemoglobin: 11 g/dL — ABNORMAL LOW (ref 12.0–15.0)
MCH: 26 pg (ref 26.0–34.0)
MCHC: 30.1 g/dL (ref 30.0–36.0)
MCV: 86.3 fL (ref 80.0–100.0)
Platelets: 285 10*3/uL (ref 150–400)
RBC: 4.23 MIL/uL (ref 3.87–5.11)
RDW: 18.4 % — ABNORMAL HIGH (ref 11.5–15.5)
WBC: 17.6 10*3/uL — ABNORMAL HIGH (ref 4.0–10.5)
nRBC: 0 % (ref 0.0–0.2)

## 2020-04-22 LAB — BASIC METABOLIC PANEL
Anion gap: 14 (ref 5–15)
BUN: 13 mg/dL (ref 8–23)
CO2: 26 mmol/L (ref 22–32)
Calcium: 9 mg/dL (ref 8.9–10.3)
Chloride: 101 mmol/L (ref 98–111)
Creatinine, Ser: 0.95 mg/dL (ref 0.44–1.00)
GFR calc Af Amer: >60 mL/min (ref >60)
GFR calc non Af Amer: >60 mL/min (ref >60)
Glucose, Bld: 142 mg/dL — ABNORMAL HIGH (ref 70–99)
Potassium: 3.9 mmol/L (ref 3.5–5.1)
Sodium: 141 mmol/L (ref 135–145)

## 2020-04-22 LAB — HIV ANTIBODY (ROUTINE TESTING W REFLEX): HIV Screen 4th Generation wRfx: NONREACTIVE

## 2020-04-22 LAB — D-DIMER, QUANTITATIVE: D-Dimer, Quant: <0.27 ug/mL-FEU (ref 0.00–0.50)

## 2020-04-22 MED ORDER — UMECLIDINIUM BROMIDE 62.5 MCG/INH IN AEPB
1.0000 | INHALATION_SPRAY | Freq: Every day | RESPIRATORY_TRACT | Status: DC
  Administered 2020-04-22 – 2020-04-24 (×3): 1 via RESPIRATORY_TRACT
  Filled 2020-04-22: qty 7

## 2020-04-22 MED ORDER — ENOXAPARIN SODIUM 30 MG/0.3ML ~~LOC~~ SOLN
30.0000 mg | Freq: Every day | SUBCUTANEOUS | Status: DC
  Administered 2020-04-22 – 2020-04-24 (×3): 30 mg via SUBCUTANEOUS
  Filled 2020-04-22 (×3): qty 0.3

## 2020-04-22 MED ORDER — FLUTICASONE FUROATE-VILANTEROL 200-25 MCG/INH IN AEPB
1.0000 | INHALATION_SPRAY | Freq: Every day | RESPIRATORY_TRACT | Status: DC
  Administered 2020-04-22 – 2020-04-24 (×3): 1 via RESPIRATORY_TRACT
  Filled 2020-04-22: qty 28

## 2020-04-22 MED ORDER — MELATONIN 5 MG PO TABS
5.0000 mg | ORAL_TABLET | Freq: Every day | ORAL | Status: DC
  Administered 2020-04-22 – 2020-04-24 (×3): 5 mg via ORAL
  Filled 2020-04-22 (×3): qty 1

## 2020-04-22 MED ORDER — IPRATROPIUM-ALBUTEROL 0.5-2.5 (3) MG/3ML IN SOLN
3.0000 mL | Freq: Four times a day (QID) | RESPIRATORY_TRACT | Status: DC
  Administered 2020-04-23 (×2): 3 mL via RESPIRATORY_TRACT
  Filled 2020-04-22 (×2): qty 3

## 2020-04-22 MED ORDER — IPRATROPIUM-ALBUTEROL 0.5-2.5 (3) MG/3ML IN SOLN
3.0000 mL | RESPIRATORY_TRACT | Status: DC
  Administered 2020-04-22 (×7): 3 mL via RESPIRATORY_TRACT
  Filled 2020-04-22 (×6): qty 3

## 2020-04-22 MED ORDER — PREDNISONE 20 MG PO TABS
40.0000 mg | ORAL_TABLET | Freq: Every day | ORAL | Status: DC
  Administered 2020-04-22 – 2020-04-25 (×4): 40 mg via ORAL
  Filled 2020-04-22 (×5): qty 2

## 2020-04-22 MED ORDER — RAMELTEON 8 MG PO TABS
8.0000 mg | ORAL_TABLET | Freq: Every day | ORAL | Status: DC
  Administered 2020-04-22 – 2020-04-23 (×2): 8 mg via ORAL
  Filled 2020-04-22 (×4): qty 1

## 2020-04-22 MED ORDER — MIRTAZAPINE 15 MG PO TABS
15.0000 mg | ORAL_TABLET | Freq: Every day | ORAL | Status: DC
  Administered 2020-04-22 – 2020-04-24 (×3): 15 mg via ORAL
  Filled 2020-04-22 (×3): qty 1

## 2020-04-22 MED ORDER — ASPIRIN EC 81 MG PO TBEC
81.0000 mg | DELAYED_RELEASE_TABLET | Freq: Every day | ORAL | Status: DC
  Administered 2020-04-22 – 2020-04-25 (×4): 81 mg via ORAL
  Filled 2020-04-22 (×4): qty 1

## 2020-04-22 MED ORDER — GABAPENTIN 100 MG PO CAPS
100.0000 mg | ORAL_CAPSULE | Freq: Three times a day (TID) | ORAL | Status: DC
  Administered 2020-04-22 – 2020-04-25 (×9): 100 mg via ORAL
  Filled 2020-04-22 (×9): qty 1

## 2020-04-22 MED ORDER — ENSURE ENLIVE PO LIQD
237.0000 mL | Freq: Three times a day (TID) | ORAL | Status: DC
  Administered 2020-04-22 – 2020-04-24 (×7): 237 mL via ORAL

## 2020-04-22 MED ORDER — SODIUM CHLORIDE 0.9 % IV SOLN: 250.0000 mL | INTRAVENOUS | Status: DC | PRN

## 2020-04-22 MED ORDER — PANTOPRAZOLE SODIUM 40 MG PO TBEC
40.0000 mg | DELAYED_RELEASE_TABLET | Freq: Every day | ORAL | Status: DC
  Administered 2020-04-23 – 2020-04-25 (×3): 40 mg via ORAL
  Filled 2020-04-22 (×3): qty 1

## 2020-04-22 MED ORDER — CYCLOSPORINE 0.05 % OP EMUL
1.0000 [drp] | Freq: Two times a day (BID) | OPHTHALMIC | Status: DC
  Administered 2020-04-22 – 2020-04-25 (×5): 1 [drp] via OPHTHALMIC
  Filled 2020-04-22 (×10): qty 1

## 2020-04-22 MED ORDER — ADULT MULTIVITAMIN W/MINERALS CH
1.0000 | ORAL_TABLET | Freq: Every day | ORAL | Status: DC
  Administered 2020-04-22 – 2020-04-25 (×4): 1 via ORAL
  Filled 2020-04-22 (×4): qty 1

## 2020-04-22 MED ORDER — SODIUM CHLORIDE 0.9 % IV SOLN
1.0000 g | Freq: Every day | INTRAVENOUS | Status: DC
  Administered 2020-04-22: 1 g via INTRAVENOUS
  Filled 2020-04-22 (×2): qty 10

## 2020-04-22 MED ORDER — ALBUTEROL SULFATE (2.5 MG/3ML) 0.083% IN NEBU: 2.5000 mg | INHALATION_SOLUTION | RESPIRATORY_TRACT | Status: DC | PRN

## 2020-04-22 MED ORDER — BUDESON-GLYCOPYRROL-FORMOTEROL 160-9-4.8 MCG/ACT IN AERO: 2.0000 | INHALATION_SPRAY | Freq: Two times a day (BID) | RESPIRATORY_TRACT | Status: DC

## 2020-04-22 MED ORDER — MELATONIN 3 MG PO TABS: 3.0000 mg | ORAL_TABLET | Freq: Every day | ORAL | Status: DC

## 2020-04-22 MED ORDER — LIP MEDEX EX OINT
TOPICAL_OINTMENT | CUTANEOUS | Status: DC | PRN
  Filled 2020-04-22: qty 7

## 2020-04-22 MED ORDER — SODIUM CHLORIDE 0.9% FLUSH: 3.0000 mL | INTRAVENOUS | Status: DC | PRN

## 2020-04-22 MED ORDER — SODIUM CHLORIDE 0.9% FLUSH
3.0000 mL | Freq: Two times a day (BID) | INTRAVENOUS | Status: DC
  Administered 2020-04-22 – 2020-04-25 (×8): 3 mL via INTRAVENOUS

## 2020-04-22 NOTE — ED Notes (Signed)
The pt appears comfortab,le at present

## 2020-04-22 NOTE — Evaluation (Signed)
Physical Therapy Evaluation Patient Details Name: Mary Jenkins MRN: [id number] DOB: 1955/11/05 Today's Date: 04/22/2020   History of Present Illness  Sophiah Rolin is a 64 y.o. female presenting with respiratory distress. PMH is significant for COPD Gold stage IV, GERD, h/o breast cancer, cervical spine arthritis, HLD, cataracts  Clinical Impression  Patient received in bed, reports she is still struggling with breathing. Has not slept but agreeable to PT assessment. She is mod independent with bed mobility. Transfers sit to stand with supervision. She transferred bed to [facility] and back with supervision. She is limited by sob. Strength and balance appear to be at baseline. She will continue to benefit from skilled PT to improve activity tolerance for safe return home.         Follow Up Recommendations Home health PT    Equipment Recommendations  None recommended by PT    Recommendations for Other Services       Precautions / Restrictions Precautions Precautions: None Precaution Comments: low fall Restrictions Weight Bearing Restrictions: No      Mobility  Bed Mobility Overal bed mobility: Modified Independent             General bed mobility comments: increased time needed due to SOB  Transfers Overall transfer level: Modified independent Equipment used: None             General transfer comment: transferred from bed to [facility] and back to bed  Ambulation/Gait Ambulation/Gait assistance: Supervision Gait Distance (Feet): 4 Feet Assistive device: None Gait Pattern/deviations: Step-through pattern Gait velocity: decreased   General Gait Details: steady when standing and taking steps, limited by sob.  Stairs            Wheelchair Mobility    Modified Rankin (Stroke Patients Only)       Balance Overall balance assessment: Independent                                           Pertinent Vitals/Pain Pain Assessment:  No/denies pain    Home Living Family/patient expects to be discharged to:: Private residence Living Arrangements: Children Available Help at Discharge: Family;Available PRN/intermittently Type of Home: Apartment Home Access: Level entry     Home Layout: One level Home Equipment: Cane - single point;Shower seat      Prior Function Level of Independence: Independent with assistive device(s)         Comments: Patient reports her daughter does cooking/cleaning, she ambulates independently in the home, uses cane for community mobility, but states she does not go out much.     Hand Dominance   Dominant Hand: Right    Extremity/Trunk Assessment   Upper Extremity Assessment Upper Extremity Assessment: Overall WFL for tasks assessed    Lower Extremity Assessment Lower Extremity Assessment: Overall WFL for tasks assessed    Cervical / Trunk Assessment Cervical / Trunk Assessment: Normal  Communication   Communication: No difficulties  Cognition   Behavior During Therapy: WFL for tasks assessed/performed Overall Cognitive Status: Within Functional Limits for tasks assessed                                        General Comments      Exercises     Assessment/Plan    PT Assessment Patient needs continued  PT services  PT Problem List Decreased mobility;Decreased activity tolerance;Cardiopulmonary status limiting activity       PT Treatment Interventions Gait training;Therapeutic activities;Therapeutic exercise;Patient/family education;Functional mobility training    PT Goals (Current goals can be found in the Care Plan section)  Acute Rehab PT Goals Patient Stated Goal: to breathe better PT Goal Formulation: With patient Time For Goal Achievement: 04/29/20 Potential to Achieve Goals: Good    Frequency Min 3X/week   Barriers to discharge        Co-evaluation               AM-PAC PT "6 Clicks" Mobility  Outcome Measure Help needed  turning from your back to your side while in a flat bed without using bedrails?: None Help needed moving from lying on your back to sitting on the side of a flat bed without using bedrails?: None Help needed moving to and from a bed to a chair (including a wheelchair)?: None Help needed standing up from a chair using your arms (e.g., wheelchair or bedside chair)?: None Help needed to walk in hospital room?: A Little Help needed climbing 3-5 steps with a railing? : A Little 6 Click Score: 22    End of Session Equipment Utilized During Treatment: Oxygen Activity Tolerance: Other (comment) (limited by sob) Patient left: in bed;with call bell/phone within reach;Other (comment) (with OT in room) Nurse Communication: Mobility status PT Visit Diagnosis: Difficulty in walking, not elsewhere classified (R26.2)    Time: 2081-3887 PT Time Calculation (min) (ACUTE ONLY): 20 min   Charges:   PT Evaluation $PT Eval Low Complexity: 1 Low PT Treatments $Therapeutic Activity: 8-22 mins        Brighton Pilley, PT, GCS 04/22/20,3:53 PM

## 2020-04-22 NOTE — ED Notes (Signed)
U NSUCCESSFUL ATTEMPT TO CALL REPORT  THEY DID NOT KNOW THEY WERE GETTING A PT  BED ASSIGNED FOR OVER ONE HOUR

## 2020-04-22 NOTE — Progress Notes (Addendum)
Family Medicine Teaching Service Daily Progress Note Intern Pager: [phone]  Patient name: Mary Jenkins Medical record number: [id number] Date of birth: [birth year] Age: 64 y.o. Gender: female  Primary Care Provider: Matilde Haymaker, MD Consultants: None  Code Status: Full  Pt Overview and Major Events to Date:  7/30 Admitted  Assessment and Plan: Mary Jenkins is a 64 y.o. female presenting with respiratory distress. PMH is significant for COPD Gold stage IV, GERD, h/o breast cancer, cervical spine arthritis, HLD, cataracts.  Acute respiratory distress  COPD Gold stage 4 Continued on home O2 of 3L satting at 100%. Although dyspneic with eating this morning on 2.5L. D-dimer <0.27. Will not a CTA chest as PE less likely given her clinical improvement with COPD exacerbation treatment and negative d-dimer. Follows closely with Dr. Melvyn Novas at [facility] last office visit 02/2020. Could consider pulm consult althougth patient at baseline home O2.   -Consider pulm consult - AM BMP/CBC - 3 month f/u Appt. 06/19/20 with Dr. Christinia Gully for at [facility]  -Albuterol nebulizer every 2 as needed -Continue budeson-glycopyrrol-formoterol 2 puffs twice daily -DuoNeb every 4 -Prednisone 40 mg daily x5 days -Ceftriaxone 1 g daily x5 days (7/31- ) - continuous pulse ox - O2 therapy prn, goal sats 88-92% - educated patient that it seems her steroid tapers were to be used prn, not continuously  GERD  Dysphagia No home meds.  Complains of feeling that she cannot swallow sometimes.  Says that sometimes she feels as though food or pill will be caught in her throat and she needs to drink water to let it "go down".  Denies choking on her food. -Speech consult for swallow eval -Consider Tums or Protonix  Cervical spine arthritis Home meds include aspirin 81.  No complaints regarding neck pain upon admission.  -Continue home medication  Cataracts Home meds include cyclosporine  drops.  -Continue restasis eyedrops   FEN/GI: Regular diet Prophylaxis: lovenox (CrCl 42.4 mL/min)  Disposition: Med tele, INP  Subjective:  Was doing fine until she started to eat breakfast became short of breath. Says that when she does not have the air on in the house, her breathing gets bad.   Objective: Temp:  [97.1 F (36.2 C)-97.7 F (36.5 C)] 97.7 F (36.5 C) (07/31 0443) Pulse Rate:  [95-117] 95 (07/31 0443) Resp:  [12-24] 24 (07/31 0443) BP: (96-135)/(62-92) 111/88 (07/31 0443) SpO2:  [99 %-100 %] 100 % (07/31 0443) FiO2 (%):  [30 %-32 %] 32 % (07/31 0413) Weight:  [45.7 kg-53.4 kg] 53.4 kg (07/31 0443) Physical Exam:  General: Appears short of breath, no acute distress. Age appropriate. Cardiac: RRR, distant heart sounds, no murmurs Respiratory: CTAB, mildly increased effort Extremities: No edema or cyanosis. Neuro: alert and oriented x4 Psych: normal affect  Laboratory: Recent Labs  Lab 04/15/20 2114 04/15/20 2114 04/21/20 1752 04/21/20 1752 04/21/20 1806 04/21/20 2132 04/22/20 0459  WBC 13.0*  --  10.6*  --   --   --  17.6*  HGB 10.7*   < > 10.9*   < > 12.6 12.9 11.0*  HCT 35.1*   < > 37.6   < > 37.0 38.0 36.5  PLT 265  --  274  --   --   --  285   < > = values in this interval not displayed.   Recent Labs  Lab 04/15/20 2114 04/15/20 2114 04/21/20 1752 04/21/20 1752 04/21/20 1806 04/21/20 2132 04/22/20 0459  NA 140   < > 141   < >  140 142 141  K 3.8   < > 4.2   < > 4.1 3.5 3.9  CL 100  --  103  --   --   --  101  CO2 31  --  28  --   --   --  26  BUN 16  --  11  --   --   --  13  CREATININE 0.78  --  0.98  --   --   --  0.95  CALCIUM 8.6*  --  8.3*  --   --   --  9.0  PROT  --   --  6.1*  --   --   --   --   BILITOT  --   --  0.8  --   --   --   --   ALKPHOS  --   --  55  --   --   --   --   ALT  --   --  20  --   --   --   --   AST  --   --  28  --   --   --   --   GLUCOSE 97  --  175*  --   --   --  142*   < > = values in this  interval not displayed.   Imaging/Diagnostic Tests: No new imaging.  Gerlene Fee, DO 04/22/2020, 7:43 AM PGY-2, [place] Intern pager: [phone], text pages welcome

## 2020-04-22 NOTE — Progress Notes (Addendum)
Family Medicine Teaching Service Daily Progress Note Intern Pager: [phone]  Patient name: Mary Jenkins           Medical record number: [id number] Date of birth: [birth year]        Age: 64 y.o.    Gender: female  Primary Care Provider: Matilde Haymaker, MD Consultants: None  Code Status: Full  Pt Overview and Major Events to Date:  7/30: Admitted 7/31: Ceftriaxone (7/31)  Assessment and Plan: Mary Jenkins a 64 y.o.femalepresenting with respiratory distress. PMH is significant forCOPDGold stage IV,GERD,h/obreast cancer, cervical spine arthritis, HLD, cataracts.  Acute on chronic respiratory failure  COPD stage 4, improving Reports going dyspnea on movement but overall feels better compared to admission. Feels better after nebulizers. On exam: poor AE bilaterally, no dyspnea or wheeze, no respiratory distress. Sats 100% 3L, RR 18, HR 77, BP 126/85 Seen by Pulmonology on 7/30 who recommended stopping azithromycin and ceftriaxone as no clear evidence of pneumonia but recommended continuing steroid taper. May need chronic steroids going forward. -Pulmonology following, appreciate recommendations  -Continuous pulse ox -AM BMP/CBC -Albuterol nebulizer Q2HPRN -Continuebudeson-glycopyrrol-formoterol2 puffs twice daily -DuoNeb Q4H -Prednisone 40 mg daily x5 days, will likely need chronic steroids -O2 therapy prn, goal sats 88-92% -3 month f/u Appt. 06/19/20 with Dr. Christinia Gully for [facility] -PT/OT -Ambulate with pulse ox when able  Normocytic anemia Hb 9.8 this morning, 11 on 7/31 -Monitor with daily CBC  GERD Dysphagia Denies symptoms this morning. -Speech consult for swallow eval -Continue Protinix   Cervical spine arthritis Home meds: Aspirin 88m once daily. -Continue aspirin 81 mg   Cataracts Home meds: cyclosporine drops. -Continue restasis eyedrops  FEN/GI:Regular diet Prophylaxis:lovenox (CrCl 42.4  mL/min)  Disposition: med-surg  Subjective:  Reports going dyspnea on movement but overall feels better compared to admission. Feels better after nebulizers. No other concerns.   Objective: Temp:  [97.7 F (36.5 C)-98.5 F (36.9 C)] 98.5 F (36.9 C) (07/31 1758) Pulse Rate:  [90-112] 92 (07/31 1758) Resp:  [12-24] 18 (07/31 1758) BP: (96-112)/(62-88) 112/77 (07/31 1758) SpO2:  [99 %-100 %] 99 % (07/31 2007) FiO2 (%):  [30 %-32 %] 30 % (07/31 1157) Weight:  [45.7 kg-53.4 kg] 53.4 kg (07/31 0443)   Physical Exam: General: Alert, no acute distress, pleasant  Cardio: Normal S1 and S2, RRR. No murmurs or rubs.   Pulm: poor AE bilaterally, no dyspnea or wheeze, no respiratory distress Abdomen: Bowel sounds normal. Abdomen soft and non-tender.  Extremities: No peripheral edema.  Neuro: Cranial nerves grossly intact  Laboratory: Recent Labs  Lab 04/15/20 2114 04/15/20 2114 04/21/20 1752 04/21/20 1752 04/21/20 1806 04/21/20 2132 04/22/20 0459  WBC 13.0*  --  10.6*  --   --   --  17.6*  HGB 10.7*   < > 10.9*   < > 12.6 12.9 11.0*  HCT 35.1*   < > 37.6   < > 37.0 38.0 36.5  PLT 265  --  274  --   --   --  285   < > = values in this interval not displayed.   Recent Labs  Lab 04/15/20 2114 04/15/20 2114 04/21/20 1752 04/21/20 1752 04/21/20 1806 04/21/20 2132 04/22/20 0459  NA 140   < > 141   < > 140 142 141  K 3.8   < > 4.2   < > 4.1 3.5 3.9  CL 100  --  103  --   --   --  101  CO2  31  --  28  --   --   --  26  BUN 16  --  11  --   --   --  13  CREATININE 0.78  --  0.98  --   --   --  0.95  CALCIUM 8.6*  --  8.3*  --   --   --  9.0  PROT  --   --  6.1*  --   --   --   --   BILITOT  --   --  0.8  --   --   --   --   ALKPHOS  --   --  55  --   --   --   --   ALT  --   --  20  --   --   --   --   AST  --   --  28  --   --   --   --   GLUCOSE 97  --  175*  --   --   --  142*   < > = values in this interval not displayed.      Imaging/Diagnostic Tests: DG Chest  Portable 1 View  Result Date: 04/21/2020 CLINICAL DATA:  Shortness of breath EXAM: PORTABLE CHEST 1 VIEW COMPARISON:  None. FINDINGS: The heart size and mediastinal contours are within normal limits. There is hyperinflation of the upper lung zones with flattening of the hemidiaphragms. No large airspace consolidation or pleural effusion. Surgical clips are seen within the right axilla. The visualized skeletal structures are unremarkable. IMPRESSION: No active disease.  Findings of COPD. Electronically Signed   By: Prudencio Pair M.D.   On: 04/21/2020 19:07    Lattie Haw, MD 04/22/2020, 8:53 PM PGY-2, [facility] Intern pager: [phone], text pages welcome

## 2020-04-22 NOTE — Progress Notes (Signed)
Initial Nutrition Assessment  RD working remotely.  DOCUMENTATION CODES:   Not applicable, suspect some degree of malnutrition but unable to confirm at this time  INTERVENTION:   - Ensure Enlive po TID, each supplement provides 350 kcal and 20 grams of protein  - MVI with minerals  NUTRITION DIAGNOSIS:   Increased nutrient needs related to chronic illness (COPD) as evidenced by estimated needs.  GOAL:   Patient will meet greater than or equal to 90% of their needs  MONITOR:   PO intake, Supplement acceptance, Labs, Weight trends  REASON FOR ASSESSMENT:   Consult COPD Protocol  ASSESSMENT:   64 year old female who presented on 7/30 in respiratory distress. PMH of COPD, GERD, breast cancer in remission, HLD.   Pt on a Regular diet with no meal completions recorded at this time.  RD attempted to speak with pt via phone call to room. Busy signal received x 2 so RD attempted again at a later time. Phone was answered then promptly hung back up. RD will attempt to obtain diet and weight history upon follow-up.  Per chart review, pt was seeing outpatient RD to assist with weight gain. Noted pt's weight has been trending up over the last 4 months (42.6 kg on 01/16/20 to 53.4 kg today).  RD will order oral nutrition supplements to aid pt in meeting kcal and protein needs during admission. Will also order daily MVI with minerals.  Given BMI of 19.59, suspect pt with some degree of malnutrition but RD unable to confirm without diet history or NFPE.  Medications reviewed and include: prednisone, IV abx  Labs reviewed: ionized calcium 1.12  NUTRITION - FOCUSED PHYSICAL EXAM:  Unable to complete at this time. RD working remotely.  Diet Order:   Diet Order            Diet regular Room service appropriate? Yes; Fluid consistency: Thin  Diet effective now                 EDUCATION NEEDS:   Not appropriate for education at this time  Skin:  Skin Assessment: Reviewed RN  Assessment  Last BM:  04/22/20  Height:   Ht Readings from Last 1 Encounters:  04/21/20 _0  (1.651 m)    Weight:   Wt Readings from Last 1 Encounters:  04/22/20 53.4 kg    Ideal Body Weight:  56.8 kg  BMI:  Body mass index is 19.59 kg/m.  Estimated Nutritional Needs:   Kcal:  1800-2000  Protein:  80-95 grams  Fluid:  >/= 1.8 L    Gaynell Face, MS, RD, LDN Inpatient Clinical Dietitian Please see AMiON for contact information.

## 2020-04-22 NOTE — Plan of Care (Signed)
Return to baseline

## 2020-04-22 NOTE — Progress Notes (Signed)
PT Cancellation Note  Patient Details Name: Mary Jenkins MRN: [id number] DOB: [birth year]   Cancelled Treatment:    Reason Eval/Treat Not Completed: Patient declined, no reason specified. States she has not slept at all, just got to room at 5am. Will re-attempt later today.    Evangelyn Crouse 04/22/2020, 9:51 AM

## 2020-04-22 NOTE — Telephone Encounter (Signed)
Evaluated Mary Jenkins while hospitalized, she is followed by Dr. Melvyn Novas.  Will you please arrange for her to be seen 1-2 weeks post hospital discharge (end of first week of August or second week will be fine).  Please call her with the appointment.  Thank you!   Noe Gens, MSN, NP-C [facility] 04/22/2020, 5:52 PM   Please see [email or web address] for pager details.

## 2020-04-22 NOTE — Evaluation (Signed)
Occupational Therapy Evaluation Patient Details Name: Mary Jenkins MRN: [id number] DOB: [birth year] Today's Date: 04/22/2020    History of Present Illness Bonni Neuser is a 64 y.o. female presenting with respiratory distress. PMH is significant for COPD Gold stage IV, GERD, h/o breast cancer, cervical spine arthritis, HLD, cataracts   Clinical Impression   This 64 yo female admitted with above presents to acute OT with PLOF of being able to do her own basic ADLs and ambulate throughout the house without AD. Currently pt gets SOB with minimal activity thus affecting her safety/independence with basic ADLs. She will continue to benefit from acute OT without need for follow up OT.    Follow Up Recommendations  No OT follow up;Supervision - Intermittent    Equipment Recommendations  3 in 1 bedside commode       Precautions / Restrictions Precautions Precautions: Fall Precaution Comments: low fall Restrictions Weight Bearing Restrictions: No      Mobility Bed Mobility Overal bed mobility: Modified Independent             General bed mobility comments: EOB to back in bed  Transfers Overall transfer level: Equipment used: None Transfers: Sit to/from Stand Sit to Stand: S         General transfer comment: transferred from bed to [facility] and back to bed    Balance Overall balance assessment: Mild deficits observed, not formally tested                                         ADL either performed or assessed with clinical judgement   ADL Overall ADL's : Needs assistance/impaired Eating/Feeding: Independent;Sitting   Grooming: Set up;Sitting   Upper Body Bathing: Set up;Sitting   Lower Body Bathing: Sit to/from stand;Supervison/ safety   Upper Body Dressing : Set up;Sitting   Lower Body Dressing: Sit to/from stand;Supervision/safety   Toilet Transfer: Stand-pivot;BSC;Supervision/safety   Toileting- Clothing Manipulation and  Hygiene: Sit to/from stand;Supervision/safety         General ADL Comments: Increased time for all tasks due to increased work of breathing with activity. Pt aware of purse lipped breathing technique for SOB.     Vision Patient Visual Report: No change from baseline              Pertinent Vitals/Pain Pain Assessment: No/denies pain     Hand Dominance Right   Extremity/Trunk Assessment Upper Extremity Assessment Upper Extremity Assessment: Overall WFL for tasks assessed     Communication Communication Communication: No difficulties   Cognition Arousal/Alertness: Awake/alert Behavior During Therapy: WFL for tasks assessed/performed Overall Cognitive Status: Within Functional Limits for tasks assessed                                                Home Living Family/patient expects to be discharged to:: Private residence Living Arrangements: Children Available Help at Discharge: Family;Available 24 hours/day Type of Home: Apartment Home Access: Level entry     Home Layout: One level     Bathroom Shower/Tub: Teacher, early years/pre: Handicapped height     Home Equipment: Cane - single point;Shower seat   Additional Comments: only does sponge baths due to shower makes her breathing worse (she has tried having vent fan on,  leaving bathroom door open, and cooler water)      Prior Functioning/Environment Level of Independence: Independent with assistive device(s)        Comments: Patient reports her daughter does cooking/cleaning, she ambulates independently in the home, uses cane for community mobility, but states she does not go out much.        OT Problem List: Decreased activity tolerance;Cardiopulmonary status limiting activity      OT Treatment/Interventions: Self-care/ADL training;DME and/or AE instruction;Patient/family education    OT Goals(Current goals can be found in the care plan section) Acute Rehab OT  Goals Patient Stated Goal: to be able to do things and not get so short of breath OT Goal Formulation: With patient Time For Goal Achievement: 05/06/20 Potential to Achieve Goals: Good  OT Frequency: Min 2X/week              AM-PAC OT "6 Clicks" Daily Activity     Outcome Measure Help from another person eating meals?: None Help from another person taking care of personal grooming?: A Little Help from another person toileting, which includes using toliet, bedpan, or urinal?: A Little Help from another person bathing (including washing, rinsing, drying)?: A Little Help from another person to put on and taking off regular upper body clothing?: A Little Help from another person to put on and taking off regular lower body clothing?: A Little 6 Click Score: 19   End of Session Equipment Utilized During Treatment: Oxygen (3 liters)  Activity Tolerance:  (limited by increased work of breathing with activity) Patient left: in bed;with call bell/phone within reach;with bed alarm set  OT Visit Diagnosis: Unsteadiness on feet (R26.81);Muscle weakness (generalized) (M62.81)                Time: 1470-9295 OT Time Calculation (min): 17 min Charges:  OT General Charges $OT Visit: 1 Visit OT Evaluation $OT Eval Moderate Complexity: 1 Mod  [address], OTR/L Acute [employer] Pager [phone] Office [phone]     Almon Register 04/22/2020, 4:49 PM

## 2020-04-22 NOTE — Consult Note (Signed)
NAME:  Mary Jenkins, MRN:  [id number], DOB:  20-Dec-1955, LOS: 0 ADMISSION DATE:  04/21/2020, CONSULTATION DATE:  04/22/20 REFERRING MD:  Dr. Nori Riis / FPTS, CHIEF COMPLAINT:  SOB   Brief History   64 y/o F admitted 7/30 with acute onset shortness of breath.   History of present illness   64 y/o F, former smoker (quit 2013), who presented to [facility] on 7/30 with reports of acute onset shortness of breath.    She is followed by Dr. Melvyn Novas for GOLD IV COPD on 3L O2 at baseline (increases to 4L with exertion), on Breztri.  EMS was activated 7/30 for difficulty breathing. She used her home nebulizer without relief of dyspnea.  On EMS arrival, she reportedly had agonal respirations and required BVM assistance with improvement.  She arrived to the ER on NRB. Prior to admit she received albuterol, atrovent, solumedrol and magnesium. CXR on admission was consistent with changes related to COPD, no acute process.  D-Dimer was negative. COVID testing negative on admit. VBG 7.3 / 70.  Follow up 7.3 / 56.  The patient was admitted for suspected COPD exacerbation.    PCCM consulted 7/31 for pulmonary evaluation.   She indicates she feels her troubles are related to her eating "spicy gummies".  She states she has been eating bags of the candies.  Ate 5-6 bags and a bucket full of them.  States she went to the restroom 7/30 at home and then became very dyspneic and had to call EMS. She thinks the spicy candies made her acid reflux worse.  She states she has gotten to the point where she gets SOB while she is eating and is having a difficult time finishing a meal.  She previously was on acid reflux medications but had been doing well and quit taking it.  She states every movement she is making right now she "gets winded".  She states she can normally move around the home without difficulty.  Denies fevers, chills, n/v/d, chest pain. She was recently has filled her PRN prednisone dosing and was confused on how to take  it.  She has been continuously taking it since June 25th.   Past Medical History  COPD - PFT's 08/2017 FEV1 0.51 (22%), ratio 32 Breast Cancer - 1997, s/p chemotherapy, mastectomy / breast reconstruction   HLD  GERD DDD  Significant Hospital Events   7/30 Admit   Consults:    Procedures:    Significant Diagnostic Tests:  D-Dimer 7/30 >> negative   Micro Data:  COVID 7/30 >> negative   Antimicrobials:  Rocephin 7/30 >>   Interim history/subjective:  As above.   Objective   Blood pressure 104/81, pulse 90, temperature 98.2 F (36.8 C), resp. rate 16, height _0  (1.651 m), weight 53.4 kg, SpO2 99 %.    Vent Mode: PCV;BIPAP FiO2 (%):  [30 %-32 %] 30 % Set Rate:  [12 bmp] 12 bmp PEEP:  [5 cmH20] 5 cmH20   Intake/Output Summary (Last 24 hours) at 04/22/2020 1652 Last data filed at 04/22/2020 0500 Gross per 24 hour  Intake 100 ml  Output 300 ml  Net -200 ml   Filed Weights   04/21/20 2128 04/22/20 0443  Weight: 45.7 kg 53.4 kg    Examination: General: thin, chronically ill appearing adult female lying in bed in NAD HEENT: MM pink/moist, dentures  Neuro: AAOx4, speech clear, MAE  CV: s1s2 RRR, no m/r/g PULM:  Prolonged expiratory phase, good air movement bilaterally, no wheezing  GI: soft, bsx4 active  Extremities: warm/dry, no edema  Skin: no rashes or lesions  Resolved Hospital Problem list     Assessment & Plan:   GOLD D COPD  Acute Exacerbation of COPD  Chronic Hypoxic Respiratory Failure  PFT's 08/2017 FEV1 0.51 (22%), ratio 32.  At baseline O2 needs.  Suspect GERD contributing to current exacerbation. D-Dimer negative. No indication of infection / infiltrate.   -stop antibiotics, no evidence of PNA -assess RVP -continue Breo, Incruse, Duoneb -add protonix, continue PPI at discharge  -continue prednisone 40 mg QD, plan to taper off.  She may be at a point where she needs a low dose of prednisone but this can be determined as outpatient if she  does not improve with GERD therapy   -outpatient pulmonary follow up with Dr. Melvyn Novas  GERD -discussed avoidance of spicy foods, chocolate, alcohol, tomato based foods, & caffiene -PPI as above  Moderate Protein Calorie Malnutrition  -Ensure TID -MVI   Best practice:  Diet: Regular  DVT prophylaxis: lovenox  GI prophylaxis: n/a Glucose control: per primary  Mobility: as tolerated  Code Status: Full Code Family Communication: Patient updated on plan of care 7/31.  Disposition: Per primary   Labs   CBC: Recent Labs  Lab 04/15/20 2114 04/21/20 1752 04/21/20 1806 04/21/20 2132 04/22/20 0459  WBC 13.0* 10.6*  --   --  17.6*  NEUTROABS 10.6*  --   --   --   --   HGB 10.7* 10.9* 12.6 12.9 11.0*  HCT 35.1* 37.6 37.0 38.0 36.5  MCV 86.7 87.9  --   --  86.3  PLT 265 274  --   --  010    Basic Metabolic Panel: Recent Labs  Lab 04/15/20 2114 04/21/20 1752 04/21/20 1806 04/21/20 2132 04/22/20 0459  NA 140 141 140 142 141  K 3.8 4.2 4.1 3.5 3.9  CL 100 103  --   --  101  CO2 31 28  --   --  26  GLUCOSE 97 175*  --   --  142*  BUN 16 11  --   --  13  CREATININE 0.78 0.98  --   --  0.95  CALCIUM 8.6* 8.3*  --   --  9.0   GFR: Estimated Creatinine Clearance: 51.1 mL/min (by C-G formula based on SCr of 0.95 mg/dL). Recent Labs  Lab 04/15/20 2114 04/21/20 1752 04/22/20 0459  WBC 13.0* 10.6* 17.6*    Liver Function Tests: Recent Labs  Lab 04/21/20 1752  AST 28  ALT 20  ALKPHOS 55  BILITOT 0.8  PROT 6.1*  ALBUMIN 3.2*   No results for input(s): LIPASE, AMYLASE in the last 168 hours. No results for input(s): AMMONIA in the last 168 hours.  ABG    Component Value Date/Time   PHART 7.359 06/13/2011 2003   PCO2ART 50.5 (H) 06/13/2011 2003   PO2ART 284.0 (H) 06/13/2011 2003   HCO3 28.9 (H) 04/21/2020 2132   TCO2 31 04/21/2020 2132   ACIDBASEDEF 1.0 01/05/2020 2135   O2SAT 80.0 04/21/2020 2132     Coagulation Profile: No results for input(s): INR, PROTIME  in the last 168 hours.  Cardiac Enzymes: No results for input(s): CKTOTAL, CKMB, CKMBINDEX, TROPONINI in the last 168 hours.  HbA1C: Hemoglobin A1C  Date/Time Value Ref Range Status  10/21/2014 03:22 PM 5.8  Final    CBG: No results for input(s): GLUCAP in the last 168 hours.  Review of Systems: Positives in [place]  Gen: Denies fever, chills, weight change, fatigue, night sweats HEENT: Denies blurred vision, double vision, hearing loss, tinnitus, sinus congestion, rhinorrhea, sore throat, neck stiffness, dysphagia PULM: Denies shortness of breath, cough, sputum production, hemoptysis, wheezing CV: Denies chest pain, edema, orthopnea, paroxysmal nocturnal dyspnea, palpitations GI: Denies abdominal pain, nausea, vomiting, diarrhea, hematochezia, melena, constipation, change in bowel habits GU: Denies dysuria, hematuria, polyuria, oliguria, urethral discharge Endocrine: Denies hot or cold intolerance, polyuria, polyphagia or appetite change Derm: Denies rash, dry skin, scaling or peeling skin change Heme: Denies easy bruising, bleeding, bleeding gums Neuro: Denies headache, numbness, weakness, slurred speech, loss of memory or consciousness  Past Medical History  She,  has a past medical history of Breast cancer ([place]), Cataract, immature, COPD (chronic obstructive pulmonary disease) ([place]), Cough (02/09/2015), DDD (degenerative disc disease), cervical, Exertional shortness of breath, Family history of adverse reaction to anesthesia, Full dentures, GERD (gastroesophageal reflux disease) (02/03/2012), High cholesterol, History of breast cancer, Personal history of chemotherapy (1997), and Runny nose (02/09/2015).   Surgical History    Past Surgical History:  Procedure Laterality Date  . BREAST CAPSULOTOMY WITH IMPLANT EXCHANGE Right 06/21/2013   Procedure: REVISION RIGHT BREAST RECONSTRUCTION/REMOVAL OF RIGHT IMPLANT/RIGHT BREAST CAPSULOTOMY WITH INSERT TISSUE EXPLANDER RIGHT BREAST/POSSIBLE  LEFT BREAST MASTOPEXY;  Surgeon: Cristine Polio, MD;  Location: [place];  Service: Plastics;  Laterality: Right;  . BREAST RECONSTRUCTION Right 06/21/2013   Procedure: BREAST RECONSTRUCTION;  Surgeon: Cristine Polio, MD;  Location: [place];  Service: Plastics;  Laterality: Right;  . BREAST RECONSTRUCTION Left 11/29/2013   Procedure: LEFT MASTOPEXY FOR RECONSTRUCTION;  Surgeon: Cristine Polio, MD;  Location: [place];  Service: Plastics;  Laterality: Left;  . BREAST RECONSTRUCTION Right 03/14/2014   Procedure: RECONSTRUCTION NIPPLE RIGHT BREAST;  Surgeon: Cristine Polio, MD;  Location: [place];  Service: Plastics;  Laterality: Right;  . BREAST SURGERY  97   implant  . CAPSULOTOMY Right 06/30/2017   Procedure: CAPSULOTOMY;  Surgeon: Cristine Polio, MD;  Location: [place];  Service: Plastics;  Laterality: Right;  . COLONOSCOPY    . MASTECTOMY, PARTIAL Right 1997   -node dissection  . REDUCTION MAMMAPLASTY Left   . REMOVAL OF TISSUE EXPANDER AND PLACEMENT OF IMPLANT Right 02/13/2015   Procedure: REMOVAL OF TISSUE  EXPANDER  PORT RIGHT BREAST;  Surgeon: Cristine Polio, MD;  Location: [place];  Service: Plastics;  Laterality: Right;  . REMOVAL OF TISSUE EXPANDER AND PLACEMENT OF IMPLANT Right 06/30/2017   Procedure: REMOVAL OF RIGHT TISSUE EXPANDER WITH PLACEMENT OF RIGHT GELL BREAST IMPLANTS;  Surgeon: Cristine Polio, MD;  Location: [place];  Service: Plastics;  Laterality: Right;  . SCAR REVISION Right 02/13/2015   Procedure: SCAR REVISION RIGHT BREAST;  Surgeon: Cristine Polio, MD;  Location: [place];  Service: Plastics;  Laterality: Right;  . TISSUE EXPANDER PLACEMENT Right 03/14/2014   Procedure: SALINE REMOVAL RIGHT TISSUE EXPANDER;  Surgeon: Cristine Polio, MD;  Location: [place];  Service: Plastics;  Laterality: Right;  .  TUBAL LIGATION  1980's     Social History   reports that she quit smoking about 7 years ago. She has never used smokeless tobacco. She reports that she does not drink alcohol and does not use drugs.   Family History   Her family history includes Allergic rhinitis in her daughter and son; Anesthesia problems in her sister; Cancer in her sister; Diabetes in her sister; Heart disease  in her brother, brother, and brother; Hypertension in her sister; Stroke in her mother; Tuberculosis in her daughter. There is no history of Breast cancer.   Allergies No Known Allergies   Home Medications  Prior to Admission medications   Medication Sig Start Date End Date Taking? Authorizing Provider  albuterol (PROAIR HFA) 108 (90 Base) MCG/ACT inhaler Inhale 2 puffs into the lungs every 6 (six) hours as needed for wheezing or shortness of breath. 03/06/20  Yes Matilde Haymaker, MD  albuterol (PROVENTIL) (2.5 MG/3ML) 0.083% nebulizer solution USE 3 ML IN NEBULIZER EVERY 6 HOURS AS NEEDED FOR  WHEEZING  OR  SHORTNESS  OF  BREATH Patient taking differently: Take 2.5 mg by nebulization every 6 (six) hours as needed for wheezing or shortness of breath.  06/14/19  Yes Tanda Rockers, MD  aspirin EC 81 MG tablet Take 81 mg by mouth daily. Swallow whole.   Yes [provider]  Budeson-Glycopyrrol-Formoterol (BREZTRI AEROSPHERE) 160-9-4.8 MCG/ACT AERO Inhale 2 puffs into the lungs 2 (two) times daily. 12/28/19  Yes Mullis, Kiersten P, DO  cycloSPORINE (RESTASIS) 0.05 % ophthalmic emulsion Place 1 drop into both eyes 2 (two) times daily.    Yes [provider]  diphenhydrAMINE HCl, Sleep, (ZZZQUIL PO) Take 2 capsules by mouth at bedtime as needed (sleep).   Yes [provider]  mirtazapine (REMERON) 15 MG tablet Take 1 tablet (15 mg total) by mouth at bedtime. 12/03/19  Yes Matilde Haymaker, MD  OXYGEN Place 3 L into the nose continuous.    Yes [provider]  predniSONE (DELTASONE) 10 MG tablet  Take  4 each am x 2 days,   2 each am x 2 days,  1 each am x 2 days and stop Patient taking differently: Take 10-40 mg by mouth See admin instructions. Tapered course ordered 03/17/2020 (with 11 refills): take 4 tablets (40 mg) by mouth daily for 2 days, then take 2 tablets (20 mg) daily for 2 days, then take 1 tablet (20 mg) for 2 days, then stop. 03/17/20  Yes Tanda Rockers, MD  valACYclovir (VALTREX) 1000 MG tablet TAKE 1 TABLET BY MOUTH TWICE DAILY FOR 3 DAYS AS NEEDED Patient taking differently: Take 1,000 mg by mouth 2 (two) times daily as needed (for flare up).  05/24/19  Yes Shelly Bombard, MD  predniSONE (DELTASONE) 20 MG tablet Take 2 tablets (40 mg total) by mouth daily. Patient not taking: Reported on 04/21/2020 04/15/20   Virgel Manifold, MD     Critical care time: n/a     Noe Gens, MSN, NP-C [facility] 04/22/2020, 4:53 PM   Please see [email or web address] for pager details.

## 2020-04-23 DIAGNOSIS — R1310 Dysphagia, unspecified: Secondary | ICD-10-CM

## 2020-04-23 DIAGNOSIS — R0603 Acute respiratory distress: Secondary | ICD-10-CM | POA: Diagnosis not present

## 2020-04-23 LAB — CBC
HCT: 32 % — ABNORMAL LOW (ref 36.0–46.0)
Hemoglobin: 9.8 g/dL — ABNORMAL LOW (ref 12.0–15.0)
MCH: 26.3 pg (ref 26.0–34.0)
MCHC: 30.6 g/dL (ref 30.0–36.0)
MCV: 85.8 fL (ref 80.0–100.0)
Platelets: 255 10*3/uL (ref 150–400)
RBC: 3.73 MIL/uL — ABNORMAL LOW (ref 3.87–5.11)
RDW: 18.2 % — ABNORMAL HIGH (ref 11.5–15.5)
WBC: 16 10*3/uL — ABNORMAL HIGH (ref 4.0–10.5)
nRBC: 0 % (ref 0.0–0.2)

## 2020-04-23 LAB — BASIC METABOLIC PANEL
Anion gap: 8 (ref 5–15)
BUN: 14 mg/dL (ref 8–23)
CO2: 31 mmol/L (ref 22–32)
Calcium: 8.7 mg/dL — ABNORMAL LOW (ref 8.9–10.3)
Chloride: 102 mmol/L (ref 98–111)
Creatinine, Ser: 0.9 mg/dL (ref 0.44–1.00)
GFR calc Af Amer: >60 mL/min (ref >60)
GFR calc non Af Amer: >60 mL/min (ref >60)
Glucose, Bld: 97 mg/dL (ref 70–99)
Potassium: 4 mmol/L (ref 3.5–5.1)
Sodium: 141 mmol/L (ref 135–145)

## 2020-04-23 MED ORDER — IPRATROPIUM-ALBUTEROL 0.5-2.5 (3) MG/3ML IN SOLN
3.0000 mL | Freq: Three times a day (TID) | RESPIRATORY_TRACT | Status: DC
  Administered 2020-04-23 – 2020-04-25 (×5): 3 mL via RESPIRATORY_TRACT
  Filled 2020-04-23 (×6): qty 3

## 2020-04-23 NOTE — Evaluation (Signed)
Clinical/Bedside Swallow Evaluation Patient Details  Name: Mary Jenkins MRN: [id number] Date of Birth: [birth year]  Today's Date: 04/23/2020 Time: SLP Start Time (ACUTE ONLY): 1451 SLP Stop Time (ACUTE ONLY): 1515 SLP Time Calculation (min) (ACUTE ONLY): 24 min  Past Medical History:  Past Medical History:  Diagnosis Date   Breast cancer ([place])    Cataract, immature    bilateral   COPD (chronic obstructive pulmonary disease) ([place])    no home O2   Cough 02/09/2015   DDD (degenerative disc disease), cervical    Exertional shortness of breath    states if she "takes her time" doing activities does not get SOB   Family history of adverse reaction to anesthesia    pt's sister has hx. of post-op N/V   Full dentures    GERD (gastroesophageal reflux disease) 02/03/2012   High cholesterol    no current med.   History of breast cancer    Personal history of chemotherapy 1997   Runny nose 02/09/2015   clear drainage, per pt.   Past Surgical History:  Past Surgical History:  Procedure Laterality Date   BREAST CAPSULOTOMY WITH IMPLANT EXCHANGE Right 06/21/2013   Procedure: REVISION RIGHT BREAST RECONSTRUCTION/REMOVAL OF RIGHT IMPLANT/RIGHT BREAST CAPSULOTOMY WITH INSERT TISSUE EXPLANDER RIGHT BREAST/POSSIBLE LEFT BREAST MASTOPEXY;  Surgeon: Cristine Polio, MD;  Location: [place];  Service: Plastics;  Laterality: Right;   BREAST RECONSTRUCTION Right 06/21/2013   Procedure: BREAST RECONSTRUCTION;  Surgeon: Cristine Polio, MD;  Location: [place];  Service: Plastics;  Laterality: Right;   BREAST RECONSTRUCTION Left 11/29/2013   Procedure: LEFT MASTOPEXY FOR RECONSTRUCTION;  Surgeon: Cristine Polio, MD;  Location: [place];  Service: Plastics;  Laterality: Left;   BREAST RECONSTRUCTION Right 03/14/2014   Procedure: RECONSTRUCTION NIPPLE RIGHT BREAST;  Surgeon: Cristine Polio, MD;  Location: [place];   Service: Plastics;  Laterality: Right;   BREAST SURGERY  97   implant   CAPSULOTOMY Right 06/30/2017   Procedure: CAPSULOTOMY;  Surgeon: Cristine Polio, MD;  Location: [place];  Service: Plastics;  Laterality: Right;   COLONOSCOPY     MASTECTOMY, PARTIAL Right 1997   -node dissection   REDUCTION MAMMAPLASTY Left    REMOVAL OF TISSUE EXPANDER AND PLACEMENT OF IMPLANT Right 02/13/2015   Procedure: REMOVAL OF TISSUE  EXPANDER  PORT RIGHT BREAST;  Surgeon: Cristine Polio, MD;  Location: [place];  Service: Plastics;  Laterality: Right;   REMOVAL OF TISSUE EXPANDER AND PLACEMENT OF IMPLANT Right 06/30/2017   Procedure: REMOVAL OF RIGHT TISSUE EXPANDER WITH PLACEMENT OF RIGHT GELL BREAST IMPLANTS;  Surgeon: Cristine Polio, MD;  Location: [place];  Service: Plastics;  Laterality: Right;   SCAR REVISION Right 02/13/2015   Procedure: SCAR REVISION RIGHT BREAST;  Surgeon: Cristine Polio, MD;  Location: [place];  Service: Plastics;  Laterality: Right;   TISSUE EXPANDER PLACEMENT Right 03/14/2014   Procedure: SALINE REMOVAL RIGHT TISSUE EXPANDER;  Surgeon: Cristine Polio, MD;  Location: [place];  Service: Plastics;  Laterality: Right;   TUBAL LIGATION  1980's   HPI:   64 year old woman with chronic hypoxic respiratory failure and secondary pulmonary hypertension in the setting of very severe COPD, gold D.  She is on 3 to 4 L/min at baseline.  She is admitted 04/21/20 with acute on chronic respiratory failure, quickly rebounded with bag mask ventilation and then treatment for possible acute exacerbation COPD.  She notes  that she had a dietary change with increased GERD that may have caused her breathing to worsen/flare; CXR on 04/21/20 indicated no active disease; findings of COPD; BSE ordered d/t pt c/o "food getting stuck intermittently during meals."  Assessment / Plan / Recommendation Clinical  Impression  Pt presents with  pharyngoesophageal dysphagia with multiple swallows noted during puree/solid consumption with liquid wash required to transition bolus into pharynx; pt c/o belching frequently and "food getting stuck" mid-chest during transition to stomach when eating/drinking intermittently; pt stated she recently began taking reflux medications again after a hx of GERD symptoms that had appeared to resolve and MD "took her off meds because she had improved with symptoms." She also stated some medications cause xerostomia which could contribute to decreased pharyngeal transitioning.  Recommend GI consult to assess esophageal function d/t pt's current esophageal symptoms and no overt s/s of aspiration noted during BSE.  ST will f/u x1 after assessment completed.  Pt educated re: esophageal precautions to follow during PO intake; pt in agreement and has been completing intermittently prior to BSE with good success such as alternating liquids/solids and taking smaller bites/sips.  Thank you for this consult. SLP Visit Diagnosis: Dysphagia, pharyngoesophageal phase (R13.14)    Aspiration Risk  Mild aspiration risk    Diet Recommendation   Regular/thin liquids w/ esophageal precautions in place  Medication Administration: Whole meds with liquid    Other  Recommendations Oral Care Recommendations: Oral care BID   Follow up Recommendations    GI consult    Frequency and Duration min 1 x/week  1 week       Prognosis Prognosis for Safe Diet Advancement: Good      Swallow Study   General Date of Onset: 04/22/20 HPI:  64 y.o. female presenting with respiratory distress. PMH is significant for COPD Gold stage IV, GERD, h/o breast cancer, cervical spine arthritis, HLD, cataracts Type of Study: Bedside Swallow Evaluation Previous Swallow Assessment: n/a Diet Prior to this Study: Regular;Thin liquids Temperature Spikes Noted: No Respiratory Status: Nasal cannula History of Recent  Intubation: No Behavior/Cognition: Alert;Cooperative Oral Cavity Assessment: Dry Oral Care Completed by SLP: No Oral Cavity - Dentition: Adequate natural dentition Vision: Functional for self-feeding Self-Feeding Abilities: Able to feed self Patient Positioning: Upright in bed Baseline Vocal Quality: Other (comment) (min hoarseness/intermittent) Volitional Cough: Strong Volitional Swallow: Able to elicit    Oral/Motor/Sensory Function Overall Oral Motor/Sensory Function: Within functional limits   Ice Chips Ice chips: Not tested   Thin Liquid Thin Liquid: Within functional limits Presentation: Cup;Straw    Nectar Thick Nectar Thick Liquid: Not tested   Honey Thick Honey Thick Liquid: Not tested   Puree Puree: Impaired Presentation: Self Fed Pharyngeal Phase Impairments: Multiple swallows   Solid     Solid: Impaired Presentation: Self Fed Pharyngeal Phase Impairments: Multiple swallows (liquid wash)      Elvina Sidle, M.S., CCC-SLP 04/23/2020,3:37 PM

## 2020-04-24 DIAGNOSIS — J449 Chronic obstructive pulmonary disease, unspecified: Secondary | ICD-10-CM

## 2020-04-24 LAB — CBC
HCT: 34.3 % — ABNORMAL LOW (ref 36.0–46.0)
Hemoglobin: 10.3 g/dL — ABNORMAL LOW (ref 12.0–15.0)
MCH: 25.8 pg — ABNORMAL LOW (ref 26.0–34.0)
MCHC: 30 g/dL (ref 30.0–36.0)
MCV: 85.8 fL (ref 80.0–100.0)
Platelets: 268 10*3/uL (ref 150–400)
RBC: 4 MIL/uL (ref 3.87–5.11)
RDW: 17.9 % — ABNORMAL HIGH (ref 11.5–15.5)
WBC: 14.5 10*3/uL — ABNORMAL HIGH (ref 4.0–10.5)
nRBC: 0 % (ref 0.0–0.2)

## 2020-04-24 LAB — BASIC METABOLIC PANEL
Anion gap: 6 (ref 5–15)
BUN: 12 mg/dL (ref 8–23)
CO2: 33 mmol/L — ABNORMAL HIGH (ref 22–32)
Calcium: 8.7 mg/dL — ABNORMAL LOW (ref 8.9–10.3)
Chloride: 100 mmol/L (ref 98–111)
Creatinine, Ser: 0.83 mg/dL (ref 0.44–1.00)
GFR calc Af Amer: >60 mL/min (ref >60)
GFR calc non Af Amer: >60 mL/min (ref >60)
Glucose, Bld: 112 mg/dL — ABNORMAL HIGH (ref 70–99)
Potassium: 3.9 mmol/L (ref 3.5–5.1)
Sodium: 139 mmol/L (ref 135–145)

## 2020-04-24 MED ORDER — ENOXAPARIN SODIUM 30 MG/0.3ML ~~LOC~~ SOLN: 30.0000 mg | Freq: Every day | SUBCUTANEOUS | Status: DC

## 2020-04-24 MED ORDER — ENOXAPARIN SODIUM 40 MG/0.4ML ~~LOC~~ SOLN: 40.0000 mg | Freq: Every day | SUBCUTANEOUS | Status: DC

## 2020-04-24 NOTE — Consult Note (Addendum)
                                                                           [facility] Consult: 10:36 AM 04/24/2020  LOS: 2 days    Referring Provider: Dr Neal  Primary Care Physician:  Frank, Peter, MD Primary Gastroenterologist:  Dr Kathya Wilz.       Reason for Consultation:  Dysphagia   HPI: Mary Jenkins is a 63 y.o. female.  PMH oxygen dependent COPD.  Breast cancer (mastectomy, chemo, reconstruction).  GERD.  DDD.  HLD.    10/2011 colonoscopy.  Average risk screening study.  No abnormal findings.  Recommend follow-up screening in 2023   Currrently admitted w COPD flare.  Despite taking Prednisone since 6/25.  About a month of dysphagia.  Senses food getting stuck at level of GE junction.  Particular triggers include solid food, bread, rice, spicy food.  She is able to swallow liquids and pudding consistencies.  When the foods appears to be getting stuck, she will swallow water until the food passes into her stomach.  Has not had any regurgitation issues.  At times when the food gets stuck, she feels short of breath.  Previously took acid suppressing medications but stopped taking this a long time ago because she was not having reflux symptoms. Denies sores or exudates in her mouth, tongue.  SLP bedside swallow eval: pharyngoesophageal dysphagia.  Recommend GI consult to assess esophageal function   Past Medical History:  Diagnosis Date  . Breast cancer (HCC)   . Cataract, immature    bilateral  . COPD (chronic obstructive pulmonary disease) (HCC)    no home O2  . Cough 02/09/2015  . DDD (degenerative disc disease), cervical   . Exertional shortness of breath    states if she "takes her time" doing activities does not get SOB  . Family history of adverse reaction to anesthesia    pt's sister has hx. of post-op N/V  . Full dentures   . GERD (gastroesophageal reflux  disease) 02/03/2012  . High cholesterol    no current med.  . History of breast cancer   . Personal history of chemotherapy 1997  . Runny nose 02/09/2015   clear drainage, per pt.    Past Surgical History:  Procedure Laterality Date  . BREAST CAPSULOTOMY WITH IMPLANT EXCHANGE Right 06/21/2013   Procedure: REVISION RIGHT BREAST RECONSTRUCTION/REMOVAL OF RIGHT IMPLANT/RIGHT BREAST CAPSULOTOMY WITH INSERT TISSUE EXPLANDER RIGHT BREAST/POSSIBLE LEFT BREAST MASTOPEXY;  Surgeon: Gerald Truesdale, MD;  Location: [facility];  Service: Plastics;  Laterality: Right;  . BREAST RECONSTRUCTION Right 06/21/2013   Procedure: BREAST RECONSTRUCTION;  Surgeon: Gerald Truesdale, MD;  Location: [facility];  Service: Plastics;  Laterality: Right;  . BREAST RECONSTRUCTION Left 11/29/2013   Procedure: LEFT MASTOPEXY FOR RECONSTRUCTION;  Surgeon: Gerald Truesdale, MD;  Location: [facility];  Service: Plastics;  Laterality: Left;  . BREAST RECONSTRUCTION Right 03/14/2014   Procedure: RECONSTRUCTION NIPPLE RIGHT BREAST;  Surgeon: Gerald Truesdale, MD;  Location: [facility];  Service: Plastics;  Laterality: Right;  . BREAST SURGERY  97   implant  . CAPSULOTOMY Right 06/30/2017   Procedure: CAPSULOTOMY;  Surgeon: Truesdale, Gerald,   MD;  Location: [facility];  Service: Plastics;  Laterality: Right;  . COLONOSCOPY    . MASTECTOMY, PARTIAL Right 1997   -node dissection  . REDUCTION MAMMAPLASTY Left   . REMOVAL OF TISSUE EXPANDER AND PLACEMENT OF IMPLANT Right 02/13/2015   Procedure: REMOVAL OF TISSUE  EXPANDER  PORT RIGHT BREAST;  Surgeon: Gerald Truesdale, MD;  Location: [facility];  Service: Plastics;  Laterality: Right;  . REMOVAL OF TISSUE EXPANDER AND PLACEMENT OF IMPLANT Right 06/30/2017   Procedure: REMOVAL OF RIGHT TISSUE EXPANDER WITH PLACEMENT OF RIGHT GELL BREAST IMPLANTS;  Surgeon: Truesdale, Gerald, MD;  Location: [facility];  Service: Plastics;  Laterality: Right;  . SCAR REVISION Right 02/13/2015   Procedure: SCAR REVISION RIGHT BREAST;  Surgeon: Gerald Truesdale, MD;  Location: [facility];  Service: Plastics;  Laterality: Right;  . TISSUE EXPANDER PLACEMENT Right 03/14/2014   Procedure: SALINE REMOVAL RIGHT TISSUE EXPANDER;  Surgeon: Gerald Truesdale, MD;  Location: [facility];  Service: Plastics;  Laterality: Right;  . TUBAL LIGATION  1980's    Prior to Admission medications   Medication Sig Start Date End Date Taking? Authorizing Provider  albuterol (PROAIR HFA) 108 (90 Base) MCG/ACT inhaler Inhale 2 puffs into the lungs every 6 (six) hours as needed for wheezing or shortness of breath. 03/06/20  Yes Frank, Peter, MD  albuterol (PROVENTIL) (2.5 MG/3ML) 0.083% nebulizer solution USE 3 ML IN NEBULIZER EVERY 6 HOURS AS NEEDED FOR  WHEEZING  OR  SHORTNESS  OF  BREATH Patient taking differently: Take 2.5 mg by nebulization every 6 (six) hours as needed for wheezing or shortness of breath.  06/14/19  Yes Wert, Michael B, MD  aspirin EC 81 MG tablet Take 81 mg by mouth daily. Swallow whole.   Yes [provider]  Budeson-Glycopyrrol-Formoterol (BREZTRI AEROSPHERE) 160-9-4.8 MCG/ACT AERO Inhale 2 puffs into the lungs 2 (two) times daily. 12/28/19  Yes Mullis, Kiersten P, DO  cycloSPORINE (RESTASIS) 0.05 % ophthalmic emulsion Place 1 drop into both eyes 2 (two) times daily.    Yes [provider]  diphenhydrAMINE HCl, Sleep, (ZZZQUIL PO) Take 2 capsules by mouth at bedtime as needed (sleep).   Yes [provider]  mirtazapine (REMERON) 15 MG tablet Take 1 tablet (15 mg total) by mouth at bedtime. 12/03/19  Yes Frank, Peter, MD  OXYGEN Place 3 L into the nose continuous.    Yes [provider]  predniSONE (DELTASONE) 10 MG tablet Take  4 each am x 2 days,   2 each am x 2 days,  1 each am x 2 days and stop Patient taking differently: Take 10-40 mg by  mouth See admin instructions. Tapered course ordered 03/17/2020 (with 11 refills): take 4 tablets (40 mg) by mouth daily for 2 days, then take 2 tablets (20 mg) daily for 2 days, then take 1 tablet (20 mg) for 2 days, then stop. 03/17/20  Yes Wert, Michael B, MD  valACYclovir (VALTREX) 1000 MG tablet TAKE 1 TABLET BY MOUTH TWICE DAILY FOR 3 DAYS AS NEEDED Patient taking differently: Take 1,000 mg by mouth 2 (two) times daily as needed (for flare up).  05/24/19  Yes Harper, Charles A, MD  predniSONE (DELTASONE) 20 MG tablet Take 2 tablets (40 mg total) by mouth daily. Patient not taking: Reported on 04/21/2020 04/15/20   Kohut, Stephen, MD    Scheduled Meds: . aspirin EC  81 mg Oral Daily  . cycloSPORINE    1 drop Both Eyes BID  . enoxaparin (LOVENOX) injection  30 mg Subcutaneous Daily  . feeding supplement (ENSURE ENLIVE)  237 mL Oral TID BM  . fluticasone furoate-vilanterol  1 puff Inhalation Daily   And  . umeclidinium bromide  1 puff Inhalation Daily  . gabapentin  100 mg Oral TID  . ipratropium-albuterol  3 mL Nebulization TID  . melatonin  5 mg Oral QHS  . mirtazapine  15 mg Oral QHS  . multivitamin with minerals  1 tablet Oral Daily  . pantoprazole  40 mg Oral Daily  . predniSONE  40 mg Oral Q breakfast  . ramelteon  8 mg Oral QHS  . sodium chloride flush  3 mL Intravenous Q12H   Infusions: . sodium chloride     PRN Meds: sodium chloride, albuterol, lip balm, sodium chloride flush   Allergies as of 04/21/2020  . (No Known Allergies)    Family History  Problem Relation Age of Onset  . Stroke Mother   . Heart disease Brother   . Heart disease Brother   . Anesthesia problems Sister        post-op N/V  . Cancer Sister        Endometrial CA  . Heart disease Brother   . Tuberculosis Daughter        not active  . Allergic rhinitis Daughter   . Allergic rhinitis Son   . Hypertension Sister   . Diabetes Sister   . Breast cancer Neg Hx     Social History   Socioeconomic  History  . Marital status: Single    Spouse name: Not on file  . Number of children: Not on file  . Years of education: Not on file  . Highest education level: Not on file  Occupational History  . Occupation: Disabled  Tobacco Use  . Smoking status: Former Smoker    Quit date: 07/23/2012    Years since quitting: 7.7  . Smokeless tobacco: Never Used  Vaping Use  . Vaping Use: Never used  Substance and Sexual Activity  . Alcohol use: No    Alcohol/week: 0.0 standard drinks    Comment: one beer every now and then  . Drug use: No  . Sexual activity: Not Currently    Partners: Male    Birth control/protection: Post-menopausal  Other Topics Concern  . Not on file  Social History Narrative   Current Social History 05/28/2019        Patient lives alone in a ground floor apartment which is 1 story. There are not steps up to the entrance the patient uses.       Patient's method of transportation is personal car.      The highest level of education was 11 th grade; currently working on getting GED!      The patient currently disabled.      Identified important Relationships are "My kids, my sister, and brothers."       Pets : None       Interests / Fun: "Go to my son's house and keep my 2 yo grandson."       Current Stressors: "People stress me out."       Religious / Personal Beliefs: "I serve an awesome God and believe in the power of prayer."       L. Ducatte, RN, BSN       Social Determinants of Health   Financial Resource Strain:   . Difficulty of Paying Living Expenses:     Food Insecurity:   . Worried About Running Out of Food in the Last Year:   . Ran Out of Food in the Last Year:   Transportation Needs:   . Lack of Transportation (Medical):   . Lack of Transportation (Non-Medical):   Physical Activity:   . Days of Exercise per Week:   . Minutes of Exercise per Session:   Stress:   . Feeling of Stress :   Social Connections:   . Frequency of Communication  with Friends and Family:   . Frequency of Social Gatherings with Friends and Family:   . Attends Religious Services:   . Active Member of Clubs or Organizations:   . Attends Club or Organization Meetings:   . Marital Status:   Intimate Partner Violence:   . Fear of Current or Ex-Partner:   . Emotionally Abused:   . Physically Abused:   . Sexually Abused:     REVIEW OF SYSTEMS: Constitutional: No profound fatigue. ENT:  No nose bleeds Pulm: Breathing is much improved since admission but still feels short of breath with activity but not at rest.  Cough abated. CV:  No palpitations, no LE edema.  No chest pain GU:  No hematuria, no frequency GI: See HPI.  No change in bowel habits. Heme: Denies unusual or excessive bleeding or bruising. Transfusions: None. Neuro:  No headaches, no peripheral tingling or numbness.  No syncope, no seizures. Derm:  No itching, no rash or sores.  Endocrine:  No sweats or chills.  No polyuria or dysuria Immunization: Has been vaccinated for COVID-19. Travel:  None beyond local counties in last few months.    PHYSICAL EXAM: Vital signs in last 24 hours: Vitals:   04/24/20 0759 04/24/20 0800  BP:  125/82  Pulse:  74  Resp:  20  Temp:  98.1 F (36.7 C)  SpO2: 100% 100%   Wt Readings from Last 3 Encounters:  04/22/20 53.4 kg  04/15/20 45.7 kg  03/17/20 45.6 kg    General: Pleasant, nonill appearing but somewhat frail.  Comfortable.  Alert. Head: No facial asymmetry or swelling.  No signs of head trauma. Eyes: No conjunctival pallor or scleral icterus.  EOMI Ears: Not hard of hearing Nose: No congestion or discharge. Mouth: Full dentures in place.  Mucosa moist, pink, clear.  No exudates.  Tongue midline. Neck: No JVD, no masses, no thyromegaly Lungs: Diminished but clear bilaterally.  No labored breathing.  No cough Heart: RRR.  No MRG.  S1, S2 present Abdomen: Soft.  Nondistended, nontender.  No HSM, masses, bruits, hernias..   Rectal:  Deferred Musc/Skeltl: No joint redness, swelling or gross deformity. Extremities: No CCE. Neurologic: Fully alert and oriented.  Good historian.  Moves all 4 limbs, strength not tested.  No tremors. Skin: No rash, no sores, no telangiectasia. Nodes: No cervical adenopathy Psych: Pleasant, cooperative, calm, fluid speech.  Intake/Output from previous day: 08/01 0701 - 08/02 0700 In: -  Out: 500 [Urine:500] Intake/Output this shift: Total I/O In: 240 [P.O.:240] Out: -   LAB RESULTS: Recent Labs    04/22/20 0459 04/23/20 0155 04/24/20 0059  WBC 17.6* 16.0* 14.5*  HGB 11.0* 9.8* 10.3*  HCT 36.5 32.0* 34.3*  PLT 285 255 268   BMET Lab Results  Component Value Date   NA 139 04/24/2020   NA 141 04/23/2020   NA 141 04/22/2020   K 3.9 04/24/2020   K 4.0 04/23/2020   K 3.9 04/22/2020   CL 100 04/24/2020     CL 102 04/23/2020   CL 101 04/22/2020   CO2 33 (H) 04/24/2020   CO2 31 04/23/2020   CO2 26 04/22/2020   GLUCOSE 112 (H) 04/24/2020   GLUCOSE 97 04/23/2020   GLUCOSE 142 (H) 04/22/2020   BUN 12 04/24/2020   BUN 14 04/23/2020   BUN 13 04/22/2020   CREATININE 0.83 04/24/2020   CREATININE 0.90 04/23/2020   CREATININE 0.95 04/22/2020   CALCIUM 8.7 (L) 04/24/2020   CALCIUM 8.7 (L) 04/23/2020   CALCIUM 9.0 04/22/2020   LFT Recent Labs    04/21/20 1752  PROT 6.1*  ALBUMIN 3.2*  AST 28  ALT 20  ALKPHOS 55  BILITOT 0.8   PT/INR Lab Results  Component Value Date   INR 1.04 06/02/2015   INR 0.96 12/07/2014   Hepatitis Panel No results for input(s): HEPBSAG, HCVAB, HEPAIGM, HEPBIGM in the last 72 hours. C-Diff No components found for: CDIFF Lipase  No results found for: LIPASE  Drugs of Abuse     Component Value Date/Time   LABOPIA NONE DETECTED 06/13/2011 2154   COCAINSCRNUR NONE DETECTED 06/13/2011 2154   LABBENZ NONE DETECTED 06/13/2011 2154   AMPHETMU NONE DETECTED 06/13/2011 2154   THCU NONE DETECTED 06/13/2011 2154   LABBARB NONE DETECTED  06/13/2011 2154      IMPRESSION:   *   Dysphagia primarily to solids.  Rule out esophageal dysmotility, esophageal spasm versus esophageal stricture.  ? candicial esophagitis in pt on Prednisone (would be atypical presentation). Hx GERD.  No previous EGD  *   COPD flare, ? Is dyphagia contributing??  *    [place] anemia  *   Normal screening colonosopy 2013.  No previous EGD.    *   Hx breast cancer, mastectomy, chemo, breast reconsruction.      PLAN:     *   EGD tomorrow   Sarah Gribbin  04/24/2020, 10:36 AM Phone [phone]  ________________________________________________________________________  [facility] MD note:  I personally examined the patient, reviewed the data and agree with the assessment and plan described above.  She's had predominantly solid food dysphagia for 1-2 months, seems to be getting more noticeable.  Mild weight loss over 6months, that has stablized in past 2-3 months.  Previous GERD on omeprazole, off for years. She has noticed heartburn is a problem again lately.   Likely acid related dysphagia however with recent steroids it certainly could be from candida infection. Neoplasm possible but less likely. We are planning for EGD tomorrow.  Mary Hatton, MD [facility] Pager [phone]   

## 2020-04-24 NOTE — Progress Notes (Signed)
SLP Cancellation Note  Patient Details Name: Arianny Pun MRN: [id number] DOB: 20-Jul-1956   Cancelled treatment:       Reason Eval/Treat Not Completed: Other (comment). Received new orders for SLP eval, pt still with ongoing GI work up. Will check in chart daily and f/u as needed.    Qunisha Bryk, Katherene Ponto 04/24/2020, 12:04 PM

## 2020-04-24 NOTE — Progress Notes (Signed)
Family Medicine Teaching Service Daily Progress Note Intern Pager: [phone]  Patient name: Mary Jenkins Medical record number: [id number] Date of birth: 12-15-1955 Age: 64 y.o. Gender: female  Primary Care Provider: Matilde Haymaker, MD Consultants: None Code Status: Full  Pt Overview and Major Events to Date: 7/30: Admitted 7/31: Ceftriaxone  Assessment and Plan: Mary Burbano Scottis a 64 y.o.femalepresenting with respiratory distress.She had agonal breaths and an apneic episode and was brought by EMS requiring bag valve mask. She was accidentally taking oral steroids chronically instead of doing the tapers, so will need a longer taper of steroids while here.  PMH is significant forCOPDGold stage IV,GERD,h/obreast cancer, cervical spine arthritis, HLD, cataracts.  Acute on chronic respiratory failure  COPD stage 4, improving Today pt feels better and says her breathing is better. Sats % 3L, RR , HR , BP  Seen by Pulmonology on 7/30 who recommended stopping azithromycin and ceftriaxone as no clear evidence of pneumonia but recommended continuing steroid taper. May need chronic steroids going forward. -Pulmonology following- 3 month f/u Appt. 9/27/21with Dr. Christinia Jenkins for at [facility]. Recommended by pulm NP to see her 1-2 weeks post hospital discharge -Continuous pulse ox -AM BMP/CBC -Albuterol nebulizer Q2HPRN -Continuebudeson-glycopyrrol-formoterol2 puffs twice daily -DuoNeb Q4H -Prednisone 40 mg daily x5 days, will likely need chronic steroids -O2 therapy prn, goal sats 88-92% -PT/OT- recommended acute OT without need for outpatient f/u  -Ambulate with pulse ox when able  Normocytic anemia Hb 10.3 this morning, 9.8 on 8/1 -Monitor with daily CBC  GERD Dysphagia -Speech consult for swallow eval: pt w/ pharyngoesophageal dysphagia w/ multiple swallows noted during puree/solid consumption. Recommend GI consult to assess esophageal function  -  GI to see pt later today  -Continue Protinix   Cervical spine arthritis Home meds: Aspirin 51m once daily. -Continue aspirin 81 mg   Cataracts Home meds: cyclosporine drops. -Continuerestasiseyedrops  FEN/GI:Regular diet Prophylaxis:lovenox (CrCl 42.4 mL/min)  Disposition: med-surg  Subjective:  Pt was receiving her albuterol nebulizer when I came into the room. She stated she was feeling better and able to breath better. She said she doesn't feel quite at her baseline yet but notes improvement.   Objective: Temp:  [97.8 F (36.6 C)-98 F (36.7 C)] 98 F (36.7 C) (08/01 2157) Pulse Rate:  [80-85] 82 (08/01 2157) Resp:  [14-20] 18 (08/01 2157) BP: (110-119)/(81) 110/81 (08/01 2157) SpO2:  [98 %-100 %] 99 % (08/01 2157) Physical Exam: General: alert and in no distress. Had just received breathing treatment  Cardiovascular: RRR. No murmurs, rubs, or gallops Respiratory: CTA. No wheezes, rales or rhonchi  Abdomen: soft, non distended.  Extremities: no edema. Pulses 2+ bilaterally in distal extremities   Laboratory: Recent Labs  Lab 04/22/20 0459 04/23/20 0155 04/24/20 0059  WBC 17.6* 16.0* 14.5*  HGB 11.0* 9.8* 10.3*  HCT 36.5 32.0* 34.3*  PLT 285 255 268   Recent Labs  Lab 04/21/20 1752 04/21/20 1806 04/22/20 0459 04/23/20 0155 04/24/20 0059  NA 141   < > 141 141 139  K 4.2   < > 3.9 4.0 3.9  CL 103   < > 101 102 100  CO2 28   < > 26 31 33*  BUN 11   < > _0 CREATININE 0.98   < > 0.95 0.90 0.83  CALCIUM 8.3*   < > 9.0 8.7* 8.7*  PROT 6.1*  --   --   --   --   BILITOT 0.8  --   --   --   --  ALKPHOS 55  --   --   --   --   ALT 20  --   --   --   --   AST 28  --   --   --   --   GLUCOSE 175*   < > 142* 97 112*   < > = values in this interval not displayed.   Imaging/Diagnostic Tests:  DG Chest 2 View  Result Date: 04/15/2020 CLINICAL DATA:  Dyspnea, COPD exacerbation   FINDINGS: Frontal and lateral views of the chest demonstrate a  stable cardiac silhouette. Lungs are hyperinflated with background emphysema. No airspace disease, effusion, or pneumothorax. No acute bony abnormalities. IMPRESSION: 1. Emphysema.  No acute process.   DG Chest Portable 1 View Result Date: 04/21/2020 CLINICAL DATA:  Shortness of breath   IMPRESSION: No active disease.  Findings of COPD.   Mary Stanley, DO 04/24/2020, 7:34 AM PGY-1, [facility] Intern pager: [phone], text pages welcome

## 2020-04-24 NOTE — Progress Notes (Signed)
Physical Therapy Treatment Patient Details Name: Mary Jenkins MRN: [id number] DOB: November 07, 1955 Today's Date: 04/24/2020    History of Present Illness Mary Jenkins is a 64 y.o. female presenting with respiratory distress. PMH is significant for COPD Gold stage IV, GERD, h/o breast cancer, cervical spine arthritis, HLD, cataracts    PT Comments    Patient received in bed, very pleasant and cooperative with PT. Able to significantly progress mobility today, and gait trained multiple laps in her room with no SOB noted and SPO2 no lower than 90% (and often in the mid-90s) on 3LPM O2, HR also WNL. Reports no concerns about return home once medically ready. Able to perform all functional transfers with independence/very distant S. Left sitting at EOB with all needs met, RN present and attending. If she continues to do this well at next therapy session, will plan for sign off due to high level of function and getting increasingly close to functional baseline.    Follow Up Recommendations  Home health PT     Equipment Recommendations  None recommended by PT    Recommendations for Other Services       Precautions / Restrictions Precautions Precautions: Fall Precaution Comments: low fall Restrictions Weight Bearing Restrictions: No    Mobility  Bed Mobility Overal bed mobility: Independent                Transfers Overall transfer level: Independent Equipment used: None Transfers: Sit to/from [employer] to Stand: Independent Stand pivot transfers: Modified independent (Device/Increase time)       General transfer comment: good safety awareness noted with all transfers, good hand placement and awareness of lines during mobility  Ambulation/Gait Ambulation/Gait assistance: Supervision Gait Distance (Feet): 40 Feet (x2) Assistive device: None Gait Pattern/deviations: Step-through pattern;WFL(Within Functional Limits) Gait velocity:  decreased   General Gait Details: steady when standing and at self selected pace, small amount of assist provided for line management otherwise no assistance needed   Stairs             Wheelchair Mobility    Modified Rankin (Stroke Patients Only)       Balance Overall balance assessment: Mild deficits observed, not formally tested                                          Cognition Arousal/Alertness: Awake/alert Behavior During Therapy: WFL for tasks assessed/performed Overall Cognitive Status: Within Functional Limits for tasks assessed                                        Exercises      General Comments General comments (skin integrity, edema, etc.): on 3LPM O2- SPO2 and HR both WNL with all activity. Sats no lower than 90% on 3LPM and often 95-96%.      Pertinent Vitals/Pain Pain Assessment: No/denies pain    Home Living                      Prior Function            PT Goals (current goals can now be found in the care plan section) Acute Rehab PT Goals Patient Stated Goal: to be able to do things and not get so short of breath PT Goal Formulation:  With patient Time For Goal Achievement: 04/29/20 Potential to Achieve Goals: Good Progress towards PT goals: Progressing toward goals    Frequency    Min 3X/week      PT Plan Current plan remains appropriate    Co-evaluation              AM-PAC PT "6 Clicks" Mobility   Outcome Measure  Help needed turning from your back to your side while in a flat bed without using bedrails?: None Help needed moving from lying on your back to sitting on the side of a flat bed without using bedrails?: None Help needed moving to and from a bed to a chair (including a wheelchair)?: None Help needed standing up from a chair using your arms (e.g., wheelchair or bedside chair)?: None Help needed to walk in hospital room?: None Help needed climbing 3-5 steps with a  railing? : A Little 6 Click Score: 23    End of Session Equipment Utilized During Treatment: Oxygen Activity Tolerance: Patient tolerated treatment well Patient left: in bed;with call bell/phone within reach;with nursing/sitter in room (sitting at EOB per her request) Nurse Communication: Mobility status PT Visit Diagnosis: Difficulty in walking, not elsewhere classified (R26.2)     Time: 1410-3013 PT Time Calculation (min) (ACUTE ONLY): 12 min  Charges:  $Gait Training: 8-22 mins                     Windell Norfolk, DPT, PN1   Supplemental Physical Therapist Mary Jenkins    Pager [phone] Acute Rehab Office [phone]

## 2020-04-24 NOTE — Progress Notes (Signed)
NAME:  Mary Jenkins, MRN:  [id number], DOB:  01-Jun-1956, LOS: 2 ADMISSION DATE:  04/21/2020, CONSULTATION DATE:  04/22/20 REFERRING MD:  Dr. Nori Riis / FPTS, CHIEF COMPLAINT:  SOB   Brief History   64 y/o F admitted 7/30 with acute onset shortness of breath.   History of present illness   64 y/o F, former smoker (quit 2013), who presented to [facility] on 7/30 with reports of acute onset shortness of breath.    She is followed by Dr. Melvyn Novas for GOLD IV COPD on 3L O2 at baseline (increases to 4L with exertion), on Breztri.  EMS was activated 7/30 for difficulty breathing. She used her home nebulizer without relief of dyspnea.  On EMS arrival, she reportedly had agonal respirations and required BVM assistance with improvement.  She arrived to the ER on NRB. Prior to admit she received albuterol, atrovent, solumedrol and magnesium. CXR on admission was consistent with changes related to COPD, no acute process.  D-Dimer was negative. COVID testing negative on admit. VBG 7.3 / 70.  Follow up 7.3 / 56.  The patient was admitted for suspected COPD exacerbation.    PCCM consulted 7/31 for pulmonary evaluation.   She indicates she feels her troubles are related to her eating "spicy gummies".  She states she has been eating bags of the candies.  Ate 5-6 bags and a bucket full of them.  States she went to the restroom 7/30 at home and then became very dyspneic and had to call EMS. She thinks the spicy candies made her acid reflux worse.  She states she has gotten to the point where she gets SOB while she is eating and is having a difficult time finishing a meal.  She previously was on acid reflux medications but had been doing well and quit taking it.  She states every movement she is making right now she "gets winded".  She states she can normally move around the home without difficulty.  Denies fevers, chills, n/v/d, chest pain. She was recently has filled her PRN prednisone dosing and was confused on how to take  it.  She has been continuously taking it since June 25th.   Past Medical History  COPD - PFT's 08/2017 FEV1 0.51 (22%), ratio 32 Breast Cancer - 1997, s/p chemotherapy, mastectomy / breast reconstruction   HLD  GERD DDD  Significant Hospital Events   7/30 Admit   Consults:   PCCM Procedures:    Significant Diagnostic Tests:  D-Dimer 7/30 >> negative   Micro Data:  COVID 7/30 >> negative  RVP 7/31> neg  Antimicrobials:  Rocephin 7/30   Interim history/subjective:  SpO2 100% on 2LNC  Objective   Blood pressure 125/82, pulse 74, temperature 98.1 F (36.7 C), temperature source Oral, resp. rate 20, height _0  (1.651 m), weight 53.4 kg, SpO2 100 %.        Intake/Output Summary (Last 24 hours) at 04/24/2020 0821 Last data filed at 04/23/2020 1600 Gross per 24 hour  Intake --  Output 500 ml  Net -500 ml   Filed Weights   04/21/20 2128 04/22/20 0443  Weight: 45.7 kg 53.4 kg    Examination: General: Chronically ill appearing, thin adult F. Reclined in bed NAD HEENT: NCAt pink mmm + dentures  Neuro: AAO x4 Following commands PERRLA CV: RRR s1s2 no rgm cap refill < 3 seconds  PULM:  CTA bilaterally. Symmetrical chest expansion. Even unlabored respirations on 2LNC  GI: soft  Thin ndnt  Extremities: symmetrical  bulk and tone no edema Skin: cdw no rash   Resolved Hospital Problem list     Assessment & Plan:   GOLD D COPD  Acute Exacerbation of COPD  Chronic Hypoxic Respiratory Failure on 3-4L [place] at home PFT's 08/2017 FEV1 0.51 (22%), ratio 32.  At baseline O2 needs.  Suspect GERD contributing to current exacerbation. D-Dimer negative. No indication of infection / infiltrate.   P -Continue supplemental O2. Baseline 3L  -Breo, incruse, duoneb, albuterol  -continue protonix, continue at discharge  -Prednisone 35m qD x 5 days, follow with taper -outpt pulm follow up needed with Dr. WMelvyn Novas May be at point where she requires low dose pred, but this can be determined  on outpatient basis if pt does not improve with GERD therapy   GERD -pt articulates dietary changes to include avoidance of spicy food, acidic foods, etoh, chocolate -PPI as above  Moderate Protein Calorie Malnutrition  -Ensure TID -MVI   Best practice:  Diet: Regular  DVT prophylaxis: lovenox  GI prophylaxis: n/a Glucose control: per primary  Mobility: as tolerated  Code Status: Full Code Family Communication: Patient updated 8/2 Disposition: Per primary   Labs   CBC: Recent Labs  Lab 04/21/20 1752 04/21/20 1752 04/21/20 1806 04/21/20 2132 04/22/20 0459 04/23/20 0155 04/24/20 0059  WBC 10.6*  --   --   --  17.6* 16.0* 14.5*  HGB 10.9*   < > 12.6 12.9 11.0* 9.8* 10.3*  HCT 37.6   < > 37.0 38.0 36.5 32.0* 34.3*  MCV 87.9  --   --   --  86.3 85.8 85.8  PLT 274  --   --   --  285 255 268   < > = values in this interval not displayed.    Basic Metabolic Panel: Recent Labs  Lab 04/21/20 1752 04/21/20 1752 04/21/20 1806 04/21/20 2132 04/22/20 0459 04/23/20 0155 04/24/20 0059  NA 141   < > 140 142 141 141 139  K 4.2   < > 4.1 3.5 3.9 4.0 3.9  CL 103  --   --   --  101 102 100  CO2 28  --   --   --  26 31 33*  GLUCOSE 175*  --   --   --  142* 97 112*  BUN 11  --   --   --  _0 CREATININE 0.98  --   --   --  0.95 0.90 0.83  CALCIUM 8.3*  --   --   --  9.0 8.7* 8.7*   < > = values in this interval not displayed.   GFR: Estimated Creatinine Clearance: 58.5 mL/min (by C-G formula based on SCr of 0.83 mg/dL). Recent Labs  Lab 04/21/20 1752 04/22/20 0459 04/23/20 0155 04/24/20 0059  WBC 10.6* 17.6* 16.0* 14.5*    Liver Function Tests: Recent Labs  Lab 04/21/20 1752  AST 28  ALT 20  ALKPHOS 55  BILITOT 0.8  PROT 6.1*  ALBUMIN 3.2*   No results for input(s): LIPASE, AMYLASE in the last 168 hours. No results for input(s): AMMONIA in the last 168 hours.  ABG    Component Value Date/Time   PHART 7.359 06/13/2011 2003   PCO2ART 50.5 (H)  06/13/2011 2003   PO2ART 284.0 (H) 06/13/2011 2003   HCO3 28.9 (H) 04/21/2020 2132   TCO2 31 04/21/2020 2132   ACIDBASEDEF 1.0 01/05/2020 2135   O2SAT 80.0 04/21/2020 2132     Coagulation Profile: No  results for input(s): INR, PROTIME in the last 168 hours.  Cardiac Enzymes: No results for input(s): CKTOTAL, CKMB, CKMBINDEX, TROPONINI in the last 168 hours.  HbA1C: Hemoglobin A1C  Date/Time Value Ref Range Status  10/21/2014 03:22 PM 5.8  Final    CBG: No results for input(s): GLUCAP in the last 168 hours.  Eliseo Gum MSN, AGACNP-BC [place] [phone] If no answer, [phone] 04/24/2020, 8:21 AM

## 2020-04-24 NOTE — Progress Notes (Signed)
Occupational Therapy Treatment Patient Details Name: Mary Jenkins MRN: [id number] DOB: April 29, 1956 Today's Date: 04/24/2020    History of present illness Mary Jenkins is a 64 y.o. female presenting with respiratory distress. PMH is significant for COPD Gold stage IV, GERD, h/o breast cancer, cervical spine arthritis, HLD, cataracts   OT comments  Pt progressing with OT goals though continues to be limited by decreased cardiopulmonary tolerance. Provided energy conservation handout with education provided to maximize independence with daily tasks. Pt limited by holding breath during tasks and increasing anxiety at times. Pt demonstrated ability to ambulate to/from bathroom without AD at Supervision level, Independent for hand hygiene at sink. Pt desats to 89% on 3 L O2, but recovered to 93% with seated rest break and cues for pursed lip breathing. Plan to assess carryover of energy conservation education during ADLs and plan for discharge from skilled OT services.    Follow Up Recommendations  No OT follow up;Supervision - Intermittent    Equipment Recommendations  3 in 1 bedside commode    Recommendations for Other Services      Precautions / Restrictions Precautions Precautions: Fall Precaution Comments: low fall Restrictions Weight Bearing Restrictions: No       Mobility Bed Mobility Overal bed mobility: Modified Independent                Transfers Overall transfer level: Needs assistance Equipment used: None Transfers: Sit to/from Stand;Stand Pivot Transfers Sit to Stand: Independent Stand pivot transfers: Supervision       General transfer comment: Supervision for stand pivot to ensure safety    Balance                                           ADL either performed or assessed with clinical judgement   ADL Overall ADL's : Needs assistance/impaired     Grooming: Independent;Standing;Wash/dry hands Grooming Details  (indicate cue type and reason): Independent for hand hygiene after task                 Toilet Transfer: Supervision/safety;Ambulation;Regular Toilet;Grab bars Toilet Transfer Details (indicate cue type and reason): Supervision for mobility to/from bathroom without AD. Assistance for O2 cord mgmt Toileting- Clothing Manipulation and Hygiene: Independent;Sit to/from stand       Functional mobility during ADLs: Supervision/safety General ADL Comments: Increased time required for tasks with intermittent cues for pursed lip breathing and other EC strategies     Vision       Perception     Praxis      Cognition Arousal/Alertness: Awake/alert Behavior During Therapy: WFL for tasks assessed/performed Overall Cognitive Status: Within Functional Limits for tasks assessed                                          Exercises     Shoulder Instructions       General Comments Pt received on 2 L O2 (wears 3 L O2 at baseline), stats at 96% at rest. Pt completed short distance mobility to/from bathroom on 3 L O2 with stats at 89% increasing to 93% within 45 seconds with seated rest break. Provided energy conservation handout with education on strategies to maximize ADL independence with pt mainly limited by holding breath during tasks and increasing anxiety during tasks.  Pertinent Vitals/ Pain       Pain Assessment: No/denies pain  Home Living                                          Prior Functioning/Environment              Frequency  Min 2X/week        Progress Toward Goals  OT Goals(current goals can now be found in the care plan section)  Progress towards OT goals: Progressing toward goals  Acute Rehab OT Goals Patient Stated Goal: to be able to do things and not get so short of breath OT Goal Formulation: With patient Time For Goal Achievement: 05/06/20 Potential to Achieve Goals: Good ADL Goals Pt Will Perform Grooming:  Independently;sitting Pt Will Perform Upper Body Bathing: Independently;sitting;standing Pt Will Perform Lower Body Bathing: Independently;sit to/from stand Pt Will Perform Upper Body Dressing: Independently;sitting Pt Will Perform Lower Body Dressing: Independently;sit to/from stand Pt Will Transfer to Toilet: Independently;ambulating;bedside commode Pt Will Perform Toileting - Clothing Manipulation and hygiene: Independently;sit to/from stand Additional ADL Goal #1: Pt will be aware of energy conservation strategies that may be of benefit to her  Plan Discharge plan remains appropriate    Co-evaluation                 AM-PAC OT "6 Clicks" Daily Activity     Outcome Measure   Help from another person eating meals?: None Help from another person taking care of personal grooming?: None Help from another person toileting, which includes using toliet, bedpan, or urinal?: A Little Help from another person bathing (including washing, rinsing, drying)?: A Little Help from another person to put on and taking off regular upper body clothing?: A Little Help from another person to put on and taking off regular lower body clothing?: A Little 6 Click Score: 20    End of Session Equipment Utilized During Treatment: Oxygen  OT Visit Diagnosis: Unsteadiness on feet (R26.81);Muscle weakness (generalized) (M62.81)   Activity Tolerance Patient tolerated treatment well   Patient Left in bed;with call bell/phone within reach   Nurse Communication Mobility status        Time: 1421-1450 OT Time Calculation (min): 29 min  Charges: OT General Charges $OT Visit: 1 Visit OT Treatments $Self Care/Home Management : 8-22 mins $Therapeutic Activity: 8-22 mins  Mary Jenkins, OTR/L   Mary Jenkins 04/24/2020, 3:03 PM

## 2020-04-24 NOTE — H&P (View-Only) (Signed)
[facility] Consult: 10:36 AM 04/24/2020  LOS: 2 days    Referring Provider: Dr Nori Riis  Primary Care Physician:  Matilde Haymaker, MD Primary Gastroenterologist:  Dr Ardis Hughs.       Reason for Consultation:  Dysphagia   HPI: Mary Jenkins is a 64 y.o. female.  PMH oxygen dependent COPD.  Breast cancer (mastectomy, chemo, reconstruction).  GERD.  DDD.  HLD.    10/2011 colonoscopy.  Average risk screening study.  No abnormal findings.  Recommend follow-up screening in 2023   Currrently admitted w COPD flare.  Despite taking Prednisone since 6/25.  About a month of dysphagia.  Senses food getting stuck at level of GE junction.  Particular triggers include solid food, bread, rice, spicy food.  She is able to swallow liquids and pudding consistencies.  When the foods appears to be getting stuck, she will swallow water until the food passes into her stomach.  Has not had any regurgitation issues.  At times when the food gets stuck, she feels short of breath.  Previously took acid suppressing medications but stopped taking this a long time ago because she was not having reflux symptoms. Denies sores or exudates in her mouth, tongue.  SLP bedside swallow eval: pharyngoesophageal dysphagia.  Recommend GI consult to assess esophageal function   Past Medical History:  Diagnosis Date  . Breast cancer ([employer])   . Cataract, immature    bilateral  . COPD (chronic obstructive pulmonary disease) ([place])    no home O2  . Cough 02/09/2015  . DDD (degenerative disc disease), cervical   . Exertional shortness of breath    states if she "takes her time" doing activities does not get SOB  . Family history of adverse reaction to anesthesia    pt's sister has hx. of post-op N/V  . Full dentures   . GERD (gastroesophageal reflux  disease) 02/03/2012  . High cholesterol    no current med.  Marland Kitchen History of breast cancer   . Personal history of chemotherapy 1997  . Runny nose 02/09/2015   clear drainage, per pt.    Past Surgical History:  Procedure Laterality Date  . BREAST CAPSULOTOMY WITH IMPLANT EXCHANGE Right 06/21/2013   Procedure: REVISION RIGHT BREAST RECONSTRUCTION/REMOVAL OF RIGHT IMPLANT/RIGHT BREAST CAPSULOTOMY WITH INSERT TISSUE EXPLANDER RIGHT BREAST/POSSIBLE LEFT BREAST MASTOPEXY;  Surgeon: Cristine Polio, MD;  Location: [place];  Service: Plastics;  Laterality: Right;  . BREAST RECONSTRUCTION Right 06/21/2013   Procedure: BREAST RECONSTRUCTION;  Surgeon: Cristine Polio, MD;  Location: [place];  Service: Plastics;  Laterality: Right;  . BREAST RECONSTRUCTION Left 11/29/2013   Procedure: LEFT MASTOPEXY FOR RECONSTRUCTION;  Surgeon: Cristine Polio, MD;  Location: [place];  Service: Plastics;  Laterality: Left;  . BREAST RECONSTRUCTION Right 03/14/2014   Procedure: RECONSTRUCTION NIPPLE RIGHT BREAST;  Surgeon: Cristine Polio, MD;  Location: [place];  Service: Plastics;  Laterality: Right;  . BREAST SURGERY  97   implant  . CAPSULOTOMY Right 06/30/2017   Procedure: CAPSULOTOMY;  Surgeon: Cristine Polio,  MD;  Location: [place];  Service: Plastics;  Laterality: Right;  . COLONOSCOPY    . MASTECTOMY, PARTIAL Right 1997   -node dissection  . REDUCTION MAMMAPLASTY Left   . REMOVAL OF TISSUE EXPANDER AND PLACEMENT OF IMPLANT Right 02/13/2015   Procedure: REMOVAL OF TISSUE  EXPANDER  PORT RIGHT BREAST;  Surgeon: Cristine Polio, MD;  Location: [place];  Service: Plastics;  Laterality: Right;  . REMOVAL OF TISSUE EXPANDER AND PLACEMENT OF IMPLANT Right 06/30/2017   Procedure: REMOVAL OF RIGHT TISSUE EXPANDER WITH PLACEMENT OF RIGHT GELL BREAST IMPLANTS;  Surgeon: Cristine Polio, MD;  Location: [place];  Service: Plastics;  Laterality: Right;  . SCAR REVISION Right 02/13/2015   Procedure: SCAR REVISION RIGHT BREAST;  Surgeon: Cristine Polio, MD;  Location: [place];  Service: Plastics;  Laterality: Right;  . TISSUE EXPANDER PLACEMENT Right 03/14/2014   Procedure: SALINE REMOVAL RIGHT TISSUE EXPANDER;  Surgeon: Cristine Polio, MD;  Location: [place];  Service: Plastics;  Laterality: Right;  . TUBAL LIGATION  1980's    Prior to Admission medications   Medication Sig Start Date End Date Taking? Authorizing Provider  albuterol (PROAIR HFA) 108 (90 Base) MCG/ACT inhaler Inhale 2 puffs into the lungs every 6 (six) hours as needed for wheezing or shortness of breath. 03/06/20  Yes Matilde Haymaker, MD  albuterol (PROVENTIL) (2.5 MG/3ML) 0.083% nebulizer solution USE 3 ML IN NEBULIZER EVERY 6 HOURS AS NEEDED FOR  WHEEZING  OR  SHORTNESS  OF  BREATH Patient taking differently: Take 2.5 mg by nebulization every 6 (six) hours as needed for wheezing or shortness of breath.  06/14/19  Yes Tanda Rockers, MD  aspirin EC 81 MG tablet Take 81 mg by mouth daily. Swallow whole.   Yes [provider]  Budeson-Glycopyrrol-Formoterol (BREZTRI AEROSPHERE) 160-9-4.8 MCG/ACT AERO Inhale 2 puffs into the lungs 2 (two) times daily. 12/28/19  Yes Mullis, Kiersten P, DO  cycloSPORINE (RESTASIS) 0.05 % ophthalmic emulsion Place 1 drop into both eyes 2 (two) times daily.    Yes [provider]  diphenhydrAMINE HCl, Sleep, (ZZZQUIL PO) Take 2 capsules by mouth at bedtime as needed (sleep).   Yes [provider]  mirtazapine (REMERON) 15 MG tablet Take 1 tablet (15 mg total) by mouth at bedtime. 12/03/19  Yes Matilde Haymaker, MD  OXYGEN Place 3 L into the nose continuous.    Yes [provider]  predniSONE (DELTASONE) 10 MG tablet Take  4 each am x 2 days,   2 each am x 2 days,  1 each am x 2 days and stop Patient taking differently: Take 10-40 mg by  mouth See admin instructions. Tapered course ordered 03/17/2020 (with 11 refills): take 4 tablets (40 mg) by mouth daily for 2 days, then take 2 tablets (20 mg) daily for 2 days, then take 1 tablet (20 mg) for 2 days, then stop. 03/17/20  Yes Tanda Rockers, MD  valACYclovir (VALTREX) 1000 MG tablet TAKE 1 TABLET BY MOUTH TWICE DAILY FOR 3 DAYS AS NEEDED Patient taking differently: Take 1,000 mg by mouth 2 (two) times daily as needed (for flare up).  05/24/19  Yes Shelly Bombard, MD  predniSONE (DELTASONE) 20 MG tablet Take 2 tablets (40 mg total) by mouth daily. Patient not taking: Reported on 04/21/2020 04/15/20   Virgel Manifold, MD    Scheduled Meds: . aspirin EC  81 mg Oral Daily  . cycloSPORINE  1 drop Both Eyes BID  . enoxaparin (LOVENOX) injection  30 mg Subcutaneous Daily  . feeding supplement (ENSURE ENLIVE)  237 mL Oral TID BM  . fluticasone furoate-vilanterol  1 puff Inhalation Daily   And  . umeclidinium bromide  1 puff Inhalation Daily  . gabapentin  100 mg Oral TID  . ipratropium-albuterol  3 mL Nebulization TID  . melatonin  5 mg Oral QHS  . mirtazapine  15 mg Oral QHS  . multivitamin with minerals  1 tablet Oral Daily  . pantoprazole  40 mg Oral Daily  . predniSONE  40 mg Oral Q breakfast  . ramelteon  8 mg Oral QHS  . sodium chloride flush  3 mL Intravenous Q12H   Infusions: . sodium chloride     PRN Meds: sodium chloride, albuterol, lip balm, sodium chloride flush   Allergies as of 04/21/2020  . (No Known Allergies)    Family History  Problem Relation Age of Onset  . Stroke Mother   . Heart disease Brother   . Heart disease Brother   . Anesthesia problems Sister        post-op N/V  . Cancer Sister        Endometrial CA  . Heart disease Brother   . Tuberculosis Daughter        not active  . Allergic rhinitis Daughter   . Allergic rhinitis Son   . Hypertension Sister   . Diabetes Sister   . Breast cancer Neg Hx     Social History   Socioeconomic  History  . Marital status: Single    Spouse name: Not on file  . Number of children: Not on file  . Years of education: Not on file  . Highest education level: Not on file  Occupational History  . Occupation: Disabled  Tobacco Use  . Smoking status: Former Smoker    Quit date: 07/23/2012    Years since quitting: 7.7  . Smokeless tobacco: Never Used  Vaping Use  . Vaping Use: Never used  Substance and Sexual Activity  . Alcohol use: No    Alcohol/week: 0.0 standard drinks    Comment: one beer every now and then  . Drug use: No  . Sexual activity: Not Currently    Partners: Male    Birth control/protection: Post-menopausal  Other Topics Concern  . Not on file  Social History Narrative   Current Social History 05/28/2019        Patient lives alone in a ground floor apartment which is 1 story. There are not steps up to the entrance the patient uses.       Patient's method of transportation is personal car.      The highest level of education was 11 th grade; currently working on getting GED!      The patient currently disabled.      Identified important Relationships are "My kids, my sister, and brothers."       Pets : None       Interests / Fun: "Go to my son's house and keep my 62 yo grandson."       Current Stressors: "People stress me out."       Religious / Personal Beliefs: "I serve an awesome God and believe in the power of prayer."       L. Ducatte, RN, BSN       Social Determinants of Health   Financial Resource Strain:   . Difficulty of Paying Living Expenses:  Food Insecurity:   . Worried About Charity fundraiser in the Last Year:   . Arboriculturist in the Last Year:   Transportation Needs:   . Film/video editor (Medical):   Marland Kitchen Lack of Transportation (Non-Medical):   Physical Activity:   . Days of Exercise per Week:   . Minutes of Exercise per Session:   Stress:   . Feeling of Stress :   Social Connections:   . Frequency of Communication  with Friends and Family:   . Frequency of Social Gatherings with Friends and Family:   . Attends Religious Services:   . Active Member of Clubs or Organizations:   . Attends Archivist Meetings:   Marland Kitchen Marital Status:   Intimate Partner Violence:   . Fear of Current or Ex-Partner:   . Emotionally Abused:   Marland Kitchen Physically Abused:   . Sexually Abused:     REVIEW OF SYSTEMS: Constitutional: No profound fatigue. ENT:  No nose bleeds Pulm: Breathing is much improved since admission but still feels short of breath with activity but not at rest.  Cough abated. CV:  No palpitations, no LE edema.  No chest pain GU:  No hematuria, no frequency GI: See HPI.  No change in bowel habits. Heme: Denies unusual or excessive bleeding or bruising. Transfusions: None. Neuro:  No headaches, no peripheral tingling or numbness.  No syncope, no seizures. Derm:  No itching, no rash or sores.  Endocrine:  No sweats or chills.  No polyuria or dysuria Immunization: Has been vaccinated for COVID-19. Travel:  None beyond local counties in last few months.    PHYSICAL EXAM: Vital signs in last 24 hours: Vitals:   04/24/20 0759 04/24/20 0800  BP:  125/82  Pulse:  74  Resp:  20  Temp:  98.1 F (36.7 C)  SpO2: 100% 100%   Wt Readings from Last 3 Encounters:  04/22/20 53.4 kg  04/15/20 45.7 kg  03/17/20 45.6 kg    General: Pleasant, nonill appearing but somewhat frail.  Comfortable.  Alert. Head: No facial asymmetry or swelling.  No signs of head trauma. Eyes: No conjunctival pallor or scleral icterus.  EOMI Ears: Not hard of hearing Nose: No congestion or discharge. Mouth: Full dentures in place.  Mucosa moist, pink, clear.  No exudates.  Tongue midline. Neck: No JVD, no masses, no thyromegaly Lungs: Diminished but clear bilaterally.  No labored breathing.  No cough Heart: RRR.  No MRG.  S1, S2 present Abdomen: Soft.  Nondistended, nontender.  No HSM, masses, bruits, hernias..   Rectal:  Deferred Musc/Skeltl: No joint redness, swelling or gross deformity. Extremities: No CCE. Neurologic: Fully alert and oriented.  Good historian.  Moves all 4 limbs, strength not tested.  No tremors. Skin: No rash, no sores, no telangiectasia. Nodes: No cervical adenopathy Psych: Pleasant, cooperative, calm, fluid speech.  Intake/Output from previous day: 08/01 0701 - 08/02 0700 In: -  Out: 500 [Urine:500] Intake/Output this shift: Total I/O In: 240 [P.O.:240] Out: -   LAB RESULTS: Recent Labs    04/22/20 0459 04/23/20 0155 04/24/20 0059  WBC 17.6* 16.0* 14.5*  HGB 11.0* 9.8* 10.3*  HCT 36.5 32.0* 34.3*  PLT 285 255 268   BMET Lab Results  Component Value Date   NA 139 04/24/2020   NA 141 04/23/2020   NA 141 04/22/2020   K 3.9 04/24/2020   K 4.0 04/23/2020   K 3.9 04/22/2020   CL 100 04/24/2020  CL 102 04/23/2020   CL 101 04/22/2020   CO2 33 (H) 04/24/2020   CO2 31 04/23/2020   CO2 26 04/22/2020   GLUCOSE 112 (H) 04/24/2020   GLUCOSE 97 04/23/2020   GLUCOSE 142 (H) 04/22/2020   BUN 12 04/24/2020   BUN 14 04/23/2020   BUN 13 04/22/2020   CREATININE 0.83 04/24/2020   CREATININE 0.90 04/23/2020   CREATININE 0.95 04/22/2020   CALCIUM 8.7 (L) 04/24/2020   CALCIUM 8.7 (L) 04/23/2020   CALCIUM 9.0 04/22/2020   LFT Recent Labs    04/21/20 1752  PROT 6.1*  ALBUMIN 3.2*  AST 28  ALT 20  ALKPHOS 55  BILITOT 0.8   PT/INR Lab Results  Component Value Date   INR 1.04 06/02/2015   INR 0.96 12/07/2014   Hepatitis Panel No results for input(s): HEPBSAG, HCVAB, HEPAIGM, HEPBIGM in the last 72 hours. C-Diff No components found for: CDIFF Lipase  No results found for: LIPASE  Drugs of Abuse     Component Value Date/Time   LABOPIA NONE DETECTED 06/13/2011 2154   COCAINSCRNUR NONE DETECTED 06/13/2011 2154   LABBENZ NONE DETECTED 06/13/2011 2154   AMPHETMU NONE DETECTED 06/13/2011 2154   THCU NONE DETECTED 06/13/2011 2154   LABBARB NONE DETECTED  06/13/2011 2154      IMPRESSION:   *   Dysphagia primarily to solids.  Rule out esophageal dysmotility, esophageal spasm versus esophageal stricture.  ? candicial esophagitis in pt on Prednisone (would be atypical presentation). Hx GERD.  No previous EGD  *   COPD flare, ? Is dyphagia contributing??  *    Elliott anemia  *   Normal screening colonosopy 2013.  No previous EGD.    *   Hx breast cancer, mastectomy, chemo, breast reconsruction.      PLAN:     *   EGD tomorrow   Azucena Freed  04/24/2020, 10:36 AM Phone [phone]  ________________________________________________________________________  Velora Heckler GI MD note:  I personally examined the patient, reviewed the data and agree with the assessment and plan described above.  She's had predominantly solid food dysphagia for 1-2 months, seems to be getting more noticeable.  Mild weight loss over 85month, that has stablized in past 2-3 months.  Previous GERD on omeprazole, off for years. She has noticed heartburn is a problem again lately.   Likely acid related dysphagia however with recent steroids it certainly could be from candida infection. Neoplasm possible but less likely. We are planning for EGD tomorrow.  DOwens Loffler MD [facility] Pager [phone]

## 2020-04-24 NOTE — Telephone Encounter (Signed)
Pt is currently still admitted in the hospital. Checked MW's schedule and he did not have any openings either this week or next week of August. I have scheduled pt a HFU with Derl Barrow, NP Wed. 8/11.  This appt will show up on pt's AVS once she is discharged from hospital. Nothing further needed.

## 2020-04-24 NOTE — Plan of Care (Signed)
  Problem: Education: Goal: Knowledge of disease or condition will improve Outcome: Not Progressing Goal: Knowledge of the prescribed therapeutic regimen will improve Outcome: Not Progressing Goal: Individualized Educational Video(s) Outcome: Not Progressing   Problem: Activity: Goal: Ability to tolerate increased activity will improve Outcome: Not Progressing Goal: Will verbalize the importance of balancing activity with adequate rest periods Outcome: Not Progressing   Problem: Respiratory: Goal: Ability to maintain a clear airway will improve Outcome: Not Progressing Goal: Levels of oxygenation will improve Outcome: Not Progressing Goal: Ability to maintain adequate ventilation will improve Outcome: Not Progressing

## 2020-04-25 ENCOUNTER — Encounter (HOSPITAL_COMMUNITY)
Admission: EM | Disposition: A | Discharge status: Home Health | Payer: Self-pay | Source: Home / Self Care | Attending: Family Medicine

## 2020-04-25 ENCOUNTER — Encounter (HOSPITAL_COMMUNITY): Payer: Self-pay | Admitting: Family Medicine

## 2020-04-25 ENCOUNTER — Inpatient Hospital Stay (HOSPITAL_COMMUNITY): Payer: Medicare Other | Admitting: Certified Registered Nurse Anesthetist

## 2020-04-25 HISTORY — PX: ESOPHAGOGASTRODUODENOSCOPY (EGD) WITH PROPOFOL: SHX5813

## 2020-04-25 LAB — BASIC METABOLIC PANEL
Anion gap: 10 (ref 5–15)
BUN: 12 mg/dL (ref 8–23)
CO2: 33 mmol/L — ABNORMAL HIGH (ref 22–32)
Calcium: 8.6 mg/dL — ABNORMAL LOW (ref 8.9–10.3)
Chloride: 96 mmol/L — ABNORMAL LOW (ref 98–111)
Creatinine, Ser: 0.71 mg/dL (ref 0.44–1.00)
GFR calc Af Amer: >60 mL/min (ref >60)
GFR calc non Af Amer: >60 mL/min (ref >60)
Glucose, Bld: 101 mg/dL — ABNORMAL HIGH (ref 70–99)
Potassium: 4.2 mmol/L (ref 3.5–5.1)
Sodium: 139 mmol/L (ref 135–145)

## 2020-04-25 LAB — CBC
HCT: 33.5 % — ABNORMAL LOW (ref 36.0–46.0)
Hemoglobin: 10.2 g/dL — ABNORMAL LOW (ref 12.0–15.0)
MCH: 26.1 pg (ref 26.0–34.0)
MCHC: 30.4 g/dL (ref 30.0–36.0)
MCV: 85.7 fL (ref 80.0–100.0)
Platelets: 254 10*3/uL (ref 150–400)
RBC: 3.91 MIL/uL (ref 3.87–5.11)
RDW: 17.9 % — ABNORMAL HIGH (ref 11.5–15.5)
WBC: 14.8 10*3/uL — ABNORMAL HIGH (ref 4.0–10.5)
nRBC: 0 % (ref 0.0–0.2)

## 2020-04-25 SURGERY — ESOPHAGOGASTRODUODENOSCOPY (EGD) WITH PROPOFOL
Anesthesia: Monitor Anesthesia Care

## 2020-04-25 MED ORDER — PROPOFOL 10 MG/ML IV BOLUS
INTRAVENOUS | Status: DC | PRN
  Administered 2020-04-25: 20 mg via INTRAVENOUS

## 2020-04-25 MED ORDER — PREDNISONE 20 MG PO TABS: 40.0000 mg | ORAL_TABLET | Freq: Every day | ORAL | 0 refills | Status: DC

## 2020-04-25 MED ORDER — PROPOFOL 500 MG/50ML IV EMUL
INTRAVENOUS | Status: DC | PRN
  Administered 2020-04-25: 120 ug/kg/min via INTRAVENOUS

## 2020-04-25 MED ORDER — LACTATED RINGERS IV SOLN: INTRAVENOUS | Status: DC | PRN

## 2020-04-25 MED ORDER — PANTOPRAZOLE SODIUM 40 MG PO TBEC: 40.0000 mg | DELAYED_RELEASE_TABLET | Freq: Every day | ORAL | 0 refills | Status: DC

## 2020-04-25 SURGICAL SUPPLY — 15 items

## 2020-04-25 NOTE — Anesthesia Preprocedure Evaluation (Signed)
Anesthesia Evaluation  Patient identified by MRN, date of birth, ID band Patient awake    Reviewed: Allergy & Precautions, NPO status , Patient's Chart, lab work & pertinent test results  History of Anesthesia Complications Negative for: history of anesthetic complications  Airway Mallampati: II  TM Distance: >3 FB Neck ROM: Full    Dental  (+) Edentulous Upper, Edentulous Lower   Pulmonary COPD,  COPD inhaler and oxygen dependent, former smoker,    Pulmonary exam normal        Cardiovascular negative cardio ROS Normal cardiovascular exam     Neuro/Psych Depression negative neurological ROS     GI/Hepatic Neg liver ROS, GERD  ,dysphagia   Endo/Other  negative endocrine ROS  Renal/GU negative Renal ROS  negative genitourinary   Musculoskeletal  (+) Arthritis ,   Abdominal   Peds  Hematology  (+) anemia ,   Anesthesia Other Findings   Reproductive/Obstetrics                            Anesthesia Physical Anesthesia Plan  ASA: IV  Anesthesia Plan: MAC   Post-op Pain Management:    Induction: Intravenous  PONV Risk Score and Plan: 2 and Propofol infusion, TIVA and Treatment may vary due to age or medical condition  Airway Management Planned: Natural Airway, Nasal Cannula and Simple Face Mask  Additional Equipment: None  Intra-op Plan:   Post-operative Plan:   Informed Consent: I have reviewed the patients History and Physical, chart, labs and discussed the procedure including the risks, benefits and alternatives for the proposed anesthesia with the patient or authorized representative who has indicated his/her understanding and acceptance.       Plan Discussed with:   Anesthesia Plan Comments:         Anesthesia Quick Evaluation

## 2020-04-25 NOTE — Transfer of Care (Signed)
Immediate Anesthesia Transfer of Care Note  Patient: Mary Jenkins  Procedure(s) Performed: ESOPHAGOGASTRODUODENOSCOPY (EGD) WITH PROPOFOL (N/A ) SAVORY DILATION (N/A )  Patient Location: PACU  Anesthesia Type:MAC  Level of Consciousness: awake, alert  and oriented  Airway & Oxygen Therapy: Patient Spontanous Breathing and Patient connected to nasal cannula oxygen  Post-op Assessment: Report given to RN and Post -op Vital signs reviewed and stable  Post vital signs: Reviewed and stable  Last Vitals:  Vitals Value Taken Time  BP    Temp    Pulse 81 04/25/20 0858  Resp 16 04/25/20 0858  SpO2 97 % 04/25/20 0858  Vitals shown include unvalidated device data.  Last Pain:  Vitals:   04/25/20 0745  TempSrc: Oral  PainSc: 0-No pain      Patients Stated Pain Goal: 0 (22/56/72 0919)  Complications: No complications documented.

## 2020-04-25 NOTE — Anesthesia Procedure Notes (Signed)
Procedure Name: MAC Date/Time: 04/25/2020 8:43 AM Performed by: Inda Coke, CRNA Pre-anesthesia Checklist: Patient identified, Emergency Drugs available, Suction available, Timeout performed and Patient being monitored Patient Re-evaluated:Patient Re-evaluated prior to induction Oxygen Delivery Method: Nasal cannula Induction Type: IV induction Dental Injury: Teeth and Oropharynx as per pre-operative assessment

## 2020-04-25 NOTE — Anesthesia Postprocedure Evaluation (Signed)
Anesthesia Post Note  Patient: Mary Jenkins  Procedure(s) Performed: ESOPHAGOGASTRODUODENOSCOPY (EGD) WITH PROPOFOL (N/A ) SAVORY DILATION (N/A )     Patient location during evaluation: Endoscopy Anesthesia Type: MAC Level of consciousness: awake and alert Pain management: pain level controlled Vital Signs Assessment: post-procedure vital signs reviewed and stable Respiratory status: spontaneous breathing, nonlabored ventilation and respiratory function stable Cardiovascular status: blood pressure returned to baseline and stable Postop Assessment: no apparent nausea or vomiting Anesthetic complications: no   No complications documented.  Last Vitals:  Vitals:   04/25/20 0910 04/25/20 0912  BP:  125/82  Pulse: 71 70  Resp: 13 10  Temp:    SpO2: 100% 100%    Last Pain:  Vitals:   04/25/20 0912  TempSrc:   PainSc: 0-No pain                 Lidia Collum

## 2020-04-25 NOTE — Op Note (Signed)
[facility] Patient Name: Mary Jenkins Procedure Date : 04/25/2020 MRN: [id number] Attending MD: Milus Banister , MD Date of Birth: 11/23/1955 CSN: [id number] Age: 64 Admit Type: Inpatient Procedure:                Upper GI endoscopy Indications:              Dysphagia, Heartburn Providers:                Milus Banister, MD, Benetta Spar RN, RN, Tyrone Apple, Technician, Cherylynn Ridges, Technician,                            Rejeana Brock, CRNA Referring MD:              Medicines:                Monitored Anesthesia Care Complications:            No immediate complications. Estimated blood loss:                            None. Estimated Blood Loss:     Estimated blood loss: none. Procedure:                Pre-Anesthesia Assessment:                           - Prior to the procedure, a History and Physical                            was performed, and patient medications and                            allergies were reviewed. The patient's tolerance of                            previous anesthesia was also reviewed. The risks                            and benefits of the procedure and the sedation                            options and risks were discussed with the patient.                            All questions were answered, and informed consent                            was obtained. Prior Anticoagulants: The patient has                            taken no previous anticoagulant or antiplatelet                            agents. ASA Grade  Assessment: II - A patient with                            mild systemic disease. After reviewing the risks                            and benefits, the patient was deemed in                            satisfactory condition to undergo the procedure.                           After obtaining informed consent, the endoscope was                            passed under direct vision. Throughout the                             procedure, the patient's blood pressure, pulse, and                            oxygen saturations were monitored continuously. The                            GIF-H190 ([id number]) Olympus gastroscope was                            introduced through the mouth, and advanced to the                            second part of duodenum. The upper GI endoscopy was                            accomplished without difficulty. The patient                            tolerated the procedure well. Scope In: Scope Out: Findings:      The esophagus was normal.      The stomach was normal.      The examined duodenum was normal. Impression:               - Normal UGI tract.                           - Dysphagia is likely from GERD. Recommendation:           - Return to hospital floor.                           - OK to d/c today from GI perspective. She had                            stopped PPI years ago but should probably be on  once daily PPI (omeprazole 41m) given her reflux                            symptoms and mild dysphagia. She should eat slowly,                            take small bites and chew her food very well.                           - Follow up in GI clinic PRN.                           - Please call, page with any further questions. Procedure Code(s):        --- Professional ---                           [id number] Esophagogastroduodenoscopy, flexible,                            transoral; diagnostic, including collection of                            specimen(s) by brushing or washing, when performed                            (separate procedure) Diagnosis Code(s):        --- Professional ---                           R13.10, Dysphagia, unspecified                           R12, Heartburn CPT copyright 2019 [employer]. All rights reserved. The codes documented in this report are preliminary and upon coder review  may  be revised to meet current compliance requirements. DMilus Banister MD 04/25/2020 8:54:16 AM This report has been signed electronically. Number of Addenda: 0

## 2020-04-25 NOTE — Plan of Care (Signed)
  Problem: Education: Goal: Knowledge of disease or condition will improve Outcome: Not Progressing Goal: Knowledge of the prescribed therapeutic regimen will improve Outcome: Not Progressing Goal: Individualized Educational Video(s) Outcome: Not Progressing   Problem: Activity: Goal: Ability to tolerate increased activity will improve Outcome: Not Progressing Goal: Will verbalize the importance of balancing activity with adequate rest periods Outcome: Not Progressing   Problem: Respiratory: Goal: Ability to maintain a clear airway will improve Outcome: Not Progressing Goal: Levels of oxygenation will improve Outcome: Not Progressing Goal: Ability to maintain adequate ventilation will improve Outcome: Not Progressing

## 2020-04-25 NOTE — Interval H&P Note (Signed)
History and Physical Interval Note:  04/25/2020 7:51 AM  Mary Jenkins  has presented today for surgery, with the diagnosis of Dysphagia.  The various methods of treatment have been discussed with the patient and family. After consideration of risks, benefits and other options for treatment, the patient has consented to  Procedure(s): ESOPHAGOGASTRODUODENOSCOPY (EGD) WITH PROPOFOL (N/A) SAVORY DILATION (N/A) as a surgical intervention.  The patient's history has been reviewed, patient examined, no change in status, stable for surgery.  I have reviewed the patient's chart and labs.  Questions were answered to the patient's satisfaction.     Milus Banister

## 2020-04-25 NOTE — Progress Notes (Signed)
SLP Cancellation Note  Patient Details Name: Kinesha Auten MRN: [id number] DOB: 1956/05/23   Cancelled treatment:       Reason Eval/Treat Not Completed: Medical issues which prohibited therapy. Pt NPO this morning with plans for EGD today. Will f/u as able.    Osie Bond., M.A. [facility] Pager [phone] Office [phone]  04/25/2020, 7:40 AM

## 2020-04-25 NOTE — Progress Notes (Signed)
Family Medicine Teaching Service Daily Progress Note Intern Pager: [phone]  Patient name: Mary Jenkins Medical record number: [id number] Date of birth: [birth year] Age: 64 y.o. Gender: female  Primary Care Provider: Matilde Haymaker, MD Consultants: None Code Status: Full  Pt Overview and Major Events to Date: 7/30:Admitted  Assessment and Plan: Mary Jenkins a 64 y.o.femalepresenting with respiratory distress.She had agonal breaths and an apneic episode and was brought by EMS requiring bag valve mask. She was accidentally taking oral steroids chronically instead of doing the tapers, so will need a longer taper of steroids while here.  PMH is significant forCOPDGold stage IV,GERD,h/obreast cancer, cervical spine arthritis, HLD, cataracts.  Acuteon chronic respiratory failure COPD stage 4, improving Pt feels well this morning and she feels breathing is improving. Sats100% 3L, RR 10 , HR 70, BP 125/82  Seen by Pulmonology on 7/30 who recommended stopping azithromycin and ceftriaxone as no clear evidence of pneumonia but recommended continuing steroid taper. May need chronic steroids going forward. -Pulmonology following- HFU scheduled with Derl Barrow NP Wed 8/11 at [facility].  -Continuous pulse ox -AM BMP/CBC -Albuterol nebulizerQ2HPRN -Continuebudeson-glycopyrrol-formoterol2 puffs twice daily -[id number] -Prednisone 40 mg daily x5 days, will likely need chronic steroids -O2 therapy prn, goal sats 88-92% -PT/OT- recommended acute OT without need for outpatient f/u. Per PT if she continues to do well at next session, will plan for sign off due to high level of function and getting close to functional baseline. -Ambulate with pulse ox when able  Normocytic anemia Hb 10.2 this morning, 10.3 on 8/2 -Monitor with daily CBC  GERD Dysphagia - s/p EGD this morning  - Speech consulted for swallow eval: pt w/ pharyngoesophageal dysphagia w/  multiple swallows noted during puree/solid consumption. Recommend GI consult to assess esophageal function  - GI consulted- likely acid related dysphagia to solids but with recent steroids it could be from candida. Neoplasm less likely. EGD completed  - Continue Protinix  Cervical spine arthritis Home meds: Aspirin 78m once daily. -Continueaspirin 81 mg  Cataracts Home meds:cyclosporine drops. -Continuerestasiseyedrops  FEN/GI:Regular diet Prophylaxis:lovenox (CrCl 42.4 mL/min)  Disposition: inpatient status   Subjective:  Pt had her EGD done this morning. Assessed pt when she returned and she stated she was feeling good. Denies shortness of breath, nausea, dizziness. Feels her breathing is improving. Denies any concerns. Currently satting 100% on 3L [place]    Objective: Temp:  [97.9 F (36.6 C)-98.2 F (36.8 C)] 98.2 F (36.8 C) (08/02 2112) Pulse Rate:  [73-96] 73 (08/02 2112) Resp:  [20] 20 (08/02 2112) BP: (96-125)/(77-82) 96/77 (08/02 2112) SpO2:  [97 %-100 %] 99 % (08/02 2112) Weight:  [50.2 kg] 50.2 kg (08/02 1626) Physical Exam: General: alert, well appearing. On 3L  Cardiovascular: RRR. No murmurs, rubs, gallops Respiratory: CTA bilaterally  Abdomen: soft, non-distended Extremities: no edema. Distal pulses 2+ bilaterally   Laboratory: Recent Labs  Lab 04/23/20 0155 04/24/20 0059 04/25/20 0105  WBC 16.0* 14.5* 14.8*  HGB 9.8* 10.3* 10.2*  HCT 32.0* 34.3* 33.5*  PLT 255 268 254   Recent Labs  Lab 04/21/20 1752 04/21/20 1806 04/23/20 0155 04/24/20 0059 04/25/20 0105  NA 141   < > 141 139 139  K 4.2   < > 4.0 3.9 4.2  CL 103   < > 102 100 96*  CO2 28   < > 31 33* 33*  BUN 11   < > _0 CREATININE 0.98   < > 0.90 0.83 0.71  CALCIUM 8.3*   < > 8.7* 8.7* 8.6*  PROT 6.1*  --   --   --   --   BILITOT 0.8  --   --   --   --   ALKPHOS 55  --   --   --   --   ALT 20  --   --   --   --   AST 28  --   --   --   --   GLUCOSE 175*   < > 97  112* 101*   < > = values in this interval not displayed.     Imaging/Diagnostic Tests:  Result Date: 04/15/2020 CLINICAL DATA:  Dyspnea, COPD exacerbation   FINDINGS: Frontal and lateral views of the chest demonstrate a stable cardiac silhouette. Lungs are hyperinflated with background emphysema. No airspace disease, effusion, or pneumothorax. No acute bony abnormalities. IMPRESSION: 1. Emphysema.  No acute process.   DG Chest Portable 1 View Result Date: 04/21/2020 CLINICAL DATA:  Shortness of breath   IMPRESSION: No active disease.  Findings of COPD.    Theone Stanley, DO 04/25/2020, 5:29 AM PGY-1, [place] Intern pager: [phone], text pages welcome

## 2020-04-25 NOTE — Discharge Instructions (Signed)
You were hospitalized at [facility] due to a COPD exacerbation.  We are so glad you are feeling better.  Be sure to follow-up with your appointments listed below.  Also please be sure to take your prednisone 70m tablet twice a day as instructed.  Return to the hospital if you develop difficulty breathing, weakness, or other concerning symptoms. Thank you for allowing [facility] take care of you.  Primary Care appointment w/ Dr. FPilar Plateon Monday 8/9 at 10:30am  Hospital follow up scheduled with BDerl [facility]arrowNP Wed [facility]   Take care, [facility] family medicine team

## 2020-04-25 NOTE — TOC Transition Note (Signed)
Transition of Care [facility]) - CM/SW Discharge Note   Patient Details  Name: Naiya Corral MRN: [id number] Date of Birth: [birth year]  Transition of Care [facility]) CM/SW Contact:  Joanne Chars, LCSW Phone Number: 04/25/2020, 2:22 PM   Clinical Narrative:   CSW spoke with pt regarding discharge needs. Pt would like [place] arranged and worked with [facility] approx 1 month ago. Choice provided.  Pt already working with [place] for O2 and would like to continue, needs rollator and 3n1.  Pt does have portable O2 here for ride home.  Pt lives at home with adult daughter, who will provide transportation home.  PCP in place, Dr Pilar Plate.  CSW spoke with Ashely at [facility], who reports they do not provide 3n1 and recommended CSW call other provider.  Zack at [facility] to provide DME.  No other needs.     Final next level of care: [place] Barriers to Discharge: Barriers Resolved   Patient Goals and CMS Choice Patient states their goals for this hospitalization and ongoing recovery are:: get back to where I was CMS Medicare.gov Compare Post Acute Care list provided to:: Patient Choice offered to / list presented to : Patient  Discharge Placement                       Discharge Plan and Services                DME Arranged: 3-N-1, Walker rolling with seat DME Agency: [facility] Date DME Agency Contacted: 04/25/20 Time DME Agency Contacted: 78 Representative spoke with at DME Agency: [place]: PT [place] Date [place]: 04/25/20 Time [place]: [place] Representative spoke with at [place]: [place]  Social Determinants of Health ([place]) Interventions     Readmission Risk Interventions No flowsheet data found.

## 2020-04-26 NOTE — Discharge Summary (Addendum)
[facility] Discharge Summary  Patient name: Mary Jenkins Medical record number: [id number] Date of birth: 05-21-1956 Age: 64 y.o. Gender: female Date of Admission: 04/21/2020  Date of Discharge: 04/25/20 Admitting Physician: Dickie La, MD  Primary Care Provider: Matilde Haymaker, MD Consultants:   Indication for Hospitalization: Acute respiratory distress   Discharge Diagnoses/Problem List:  Acute respiratory distress Dysphagia   Disposition: Home  Discharge Condition: Stable  Discharge Exam:  Temp:  [97.9 F (36.6 C)-98.2 F (36.8 C)] 98.2 F (36.8 C) (08/02 2112) Pulse Rate:  [73-96] 73 (08/02 2112) Resp:  [20] 20 (08/02 2112) BP: (96-125)/(77-82) 96/77 (08/02 2112) SpO2:  [97 %-100 %] 99 % (08/02 2112) Weight:  [50.2 kg] 50.2 kg (08/02 1626)  Physical Exam: General: alert, well appearing. On 3L  Cardiovascular: RRR. No murmurs, rubs, gallops Respiratory: CTA bilaterally  Abdomen: soft, non-distended Extremities: no edema. Distal pulses 2+ bilaterally   Brief Hospital Course:   Acute respiratory distress  Patient admitted for COPD exacerbation after not responding to nebulizer treatments at home.  Patient became apneic in ambulance, required about 1 minute of BVM support, responded well then placed on NRB. She received Atrovent, Solu-Medrol, and magnesium from EMS.  In ED, placed on BiPAP and given albuterol.  Patient had good response, switched to BiPAP with SpO2 100%.  Initial VBG with compensated respiratory acidosis, pH 7.317/70.8/42/36.2.  Venous blood gas showed PCO2 normalizing to 56.4, PO2 increasing, acid-base excess decreasing to 2, bicarb decreasing (last 28.9).  While in room for admission, vital signs 110s/70s, pulse 110s, SPO2 100%. Pt was able to speak in full sentences but work of breathing increased. Wells score 1.5 given tachycardia, but of note, patient had received CAT which could also cause tachycardia.  WBC 13.0 > 10.6,  hemoglobin 10.7 > 10.9 > 12.6 > 12.9.  Glucose 97 > 175. Patient maintained 98-100% on 3L [place] which is her baseline at home. Pulmonology recommended Prednisone 88m qD x 5 days, followed with taper. Patient will follow up outpatient with pulmonology and decision whether or not to start low-dose prednisone will be determined.  Dysphagia  Patient was experiencing difficulties with swallowing likely due to GERD. She had a speech consult for swallow evaluation which noted pharyngoesophageal dysphagia w/ multiple swallows noted during puree/solid consumption. Patient was consulted by GI and had an EGD done which was unremarkable.  GI recommended to start omeprazole 40 mg daily.  Issues for Follow Up:  Follow up with PCP Follow-up with GI and Pulmonology outpatient Start omeprazole 40 mg daily 4.   Prednisone 412mqD x 5 days, follow with taper 5.   Continue Breo, incruse, duoneb, albuterol    Significant Procedures:  EGD  Significant Labs and Imaging:  Recent Labs  Lab 04/23/20 0155 04/24/20 0059 04/25/20 0105  WBC 16.0* 14.5* 14.8*  HGB 9.8* 10.3* 10.2*  HCT 32.0* 34.3* 33.5*  PLT 255 268 254   Recent Labs  Lab 04/21/20 1752 04/21/20 1806 04/21/20 2132 04/21/20 2132 04/22/20 0459 04/22/20 0459 04/23/20 0155 04/23/20 0155 04/24/20 0059 04/25/20 0105  NA 141   < > 142  --  141  --  141  --  139 139  K 4.2   < > 3.5   < > 3.9   < > 4.0   < > 3.9 4.2  CL 103  --   --   --  101  --  102  --  100 96*  CO2 28  --   --   --  26  --  31  --  33* 33*  GLUCOSE 175*  --   --   --  142*  --  97  --  112* 101*  BUN 11  --   --   --  13  --  14  --  12 12  CREATININE 0.98  --   --   --  0.95  --  0.90  --  0.83 0.71  CALCIUM 8.3*  --   --   --  9.0  --  8.7*  --  8.7* 8.6*  ALKPHOS 55  --   --   --   --   --   --   --   --   --   AST 28  --   --   --   --   --   --   --   --   --   ALT 20  --   --   --   --   --   --   --   --   --   ALBUMIN 3.2*  --   --   --   --   --   --   --   --    --    < > = values in this interval not displayed.     Results/Tests Pending at Time of Discharge:   Result Date: 04/15/2020 CLINICAL DATA:  Dyspnea, COPD exacerbation   FINDINGS: Frontal and lateral views of the chest demonstrate a stable cardiac silhouette. Lungs are hyperinflated with background emphysema. No airspace disease, effusion, or pneumothorax. No acute bony abnormalities. IMPRESSION: 1. Emphysema.  No acute process.    DG Chest Portable 1 View Result Date: 04/21/2020 CLINICAL DATA:  Shortness of breath   IMPRESSION: No active disease.  Findings of COPD.   EGD normal     Discharge Medications:  Allergies as of 04/25/2020   No Known Allergies      Medication List     TAKE these medications    albuterol 108 (90 Base) MCG/ACT inhaler Commonly known as: ProAir HFA Inhale 2 puffs into the lungs every 6 (six) hours as needed for wheezing or shortness of breath. What changed: Another medication with the same name was removed. Continue taking this medication, and follow the directions you see here.   aspirin EC 81 MG tablet Take 81 mg by mouth daily. Swallow whole.   Breztri Aerosphere 160-9-4.8 MCG/ACT Aero Generic drug: Budeson-Glycopyrrol-Formoterol Inhale 2 puffs into the lungs 2 (two) times daily.   cycloSPORINE 0.05 % ophthalmic emulsion Commonly known as: RESTASIS Place 1 drop into both eyes 2 (two) times daily.   mirtazapine 15 MG tablet Commonly known as: Remeron Take 1 tablet (15 mg total) by mouth at bedtime.   OXYGEN Place 3 L into the nose continuous.   pantoprazole 40 MG tablet Commonly known as: PROTONIX Take 1 tablet (40 mg total) by mouth daily.   predniSONE 20 MG tablet Commonly known as: DELTASONE Take 2 tablets (40 mg total) by mouth daily with breakfast. What changed:  medication strength how much to take how to take this when to take this additional instructions Another medication with the same name was removed. Continue taking  this medication, and follow the directions you see here.   valACYclovir 1000 MG tablet Commonly known as: VALTREX TAKE 1 TABLET BY MOUTH TWICE DAILY FOR 3 DAYS AS NEEDED What changed:  how much to take how to take  this when to take this reasons to take this additional instructions   ZZZQUIL PO Take 2 capsules by mouth at bedtime as needed (sleep).        Discharge Instructions: Please refer to Patient Instructions section of EMR for full details.  Patient was counseled important signs and symptoms that should prompt return to medical care, changes in medications, dietary instructions, activity restrictions, and follow up appointments.   Follow-Up Appointments:  Follow-up Information     Matilde Haymaker, MD. Go on 05/01/2020.   Specialty: Family Medicine Why: Please arrive at 10:15 for your appointment with Dr. Pilar Plate at 10:30 AM  Contact information: [place] [id number] [phone]         Martyn Ehrich, NP. Go on 05/03/2020.   Specialty: Pulmonary Disease Why: Appointment at 11:30am Contact information: [place] [id number] [phone]         [facility] Follow up.   Specialty: Home Health Services Why: [facility] will call to schedule your appointment. Contact information: [place] [address] [place], [place], DO 04/26/2020, 5:32 PM PGY-1, [place] Upper-Level Resident Addendum I have independently interviewed and examined the patient. I have discussed the above with the original author and agree with their documentation. Please see also any attending notes.    Milus Banister, DO PGY-3, [place] [facility] 04/29/2020 6:58 AM  FPTS Service pager: [phone] (text pages welcome through [facility])

## 2020-05-01 ENCOUNTER — Ambulatory Visit (INDEPENDENT_AMBULATORY_CARE_PROVIDER_SITE_OTHER): Payer: Medicare Other | Admitting: Family Medicine

## 2020-05-01 ENCOUNTER — Other Ambulatory Visit: Payer: Self-pay

## 2020-05-01 VITALS — BP 132/64 | HR 117 | Ht 66.0 in

## 2020-05-01 DIAGNOSIS — K219 Gastro-esophageal reflux disease without esophagitis: Secondary | ICD-10-CM

## 2020-05-01 DIAGNOSIS — J449 Chronic obstructive pulmonary disease, unspecified: Secondary | ICD-10-CM

## 2020-05-01 DIAGNOSIS — D649 Anemia, unspecified: Secondary | ICD-10-CM | POA: Diagnosis not present

## 2020-05-01 MED ORDER — PREDNISONE 5 MG PO TABS: 5.0000 mg | ORAL_TABLET | Freq: Every day | ORAL | 0 refills | Status: DC

## 2020-05-01 NOTE — Patient Instructions (Addendum)
Prednisone: Take 1 tablet (20 mg) daily for 3 days.  Then take 1/2 tablet (10 mg) for 3 days. Then take 5 mg tablet for 3 days.  I recommend that you see pulmonology before filling your prescription for the 5 mg tablets.  Pulmonology may have different recommendations for your steroids and I would recommend following their advice.  Heartburn: Looks like you have improved a lot taking your Protonix.  Continue taking your Protonix.  You do not need to see a GI doctor unless you are having new symptoms.  Anemia: We will get some blood test today to look into this low blood number.  If it something that can be addressed with iron pills then we will give you some iron pills.

## 2020-05-01 NOTE — Assessment & Plan Note (Signed)
Likely anemia of chronic disease versus iron deficiency anemia.  Now obvious source of bleeding.  Will draw iron labs today.  If clearly iron deficient, will recommend repeat colonoscopy and start on oral iron.

## 2020-05-01 NOTE — Assessment & Plan Note (Signed)
Her respiratory status has remained stable since hospital discharge.  Today we discussed a steroid taper and ensure that she has close follow-up with pulmonology. -Starting tomorrow, take prednisone 20 mg for 3 days followed by prednisone 10 mg for 3 days followed by prednisone 5 mg for 3 days. -Follow-up with pulmonology on 8/11 -Continue Breztri at home

## 2020-05-01 NOTE — Assessment & Plan Note (Signed)
Well-controlled on Protonix. -Continue Protonix -No need for GI follow-up at this time.

## 2020-05-01 NOTE — Progress Notes (Signed)
° ° °  SUBJECTIVE:   CHIEF COMPLAINT / HPI:   Hospital follow-up  COPD exacerbation Overall, she feels that her shortness of breath has now worsened since hospital discharge.  She was discharged from the hospital on 40 mg of prednisone daily without a tapering schedule.  In addition to her prednisone, she is also taking her breast tree inhaler 2 times daily and occasionally uses her albuterol when she feels short of breath.  She is continued using her oxygen at home at 3 L.  She has follow-up with pulmonology on 8/11.  She does not currently smoke.  She has received her Covid vaccine.  GERD She has been taking Protonix since hospital discharge with good resolution of her GERD symptoms.  She has no new GERD symptoms at this time and no plan to follow-up with GI in the outpatient setting.  Anemia Hemoglobin 10.2 during her hospitalization.  She denies vaginal and rectal bleeding.  She is not currently taking any iron supplements.  PERTINENT  PMH / PSH: Chronic respiratory failure with home oxygen use  OBJECTIVE:   BP 132/64    Pulse (!) 117    Ht _0  (1.676 m)    SpO2 95%    BMI 17.87 kg/m    General: Chronically ill-appearing woman.  Seated in her rolling walker with to oxygen cannula in place. Respiratory: Breathing comfortably on 2 L nasal cannula.  Able to converse comfortably without significant effort.  Decreased air movement in lower fields.  No evidence of wheezing on my exam. Cardiac: Regular rate and rhythm.  Not tachycardic.  No murmurs/rubs. Skin: Warm, dry.  ASSESSMENT/PLAN:   GERD (gastroesophageal reflux disease) Well-controlled on Protonix. -Continue Protonix -No need for GI follow-up at this time.  Anemia Likely anemia of chronic disease versus iron deficiency anemia.  Now obvious source of bleeding.  Will draw iron labs today.  If clearly iron deficient, will recommend repeat colonoscopy and start on oral iron.  COPD GOLD IV Her respiratory status has remained  stable since hospital discharge.  Today we discussed a steroid taper and ensure that she has close follow-up with pulmonology. -Starting tomorrow, take prednisone 20 mg for 3 days followed by prednisone 10 mg for 3 days followed by prednisone 5 mg for 3 days. -Follow-up with pulmonology on 8/11 -Continue Breztri at home     Matilde Haymaker, MD [place]

## 2020-05-02 LAB — IRON AND TIBC
Iron Saturation: 24 % (ref 15–55)
Iron: 76 ug/dL (ref 27–139)
Total Iron Binding Capacity: 318 ug/dL (ref 250–450)
UIBC: 242 ug/dL (ref 118–369)

## 2020-05-02 LAB — FERRITIN: Ferritin: 85 ng/mL (ref 15–150)

## 2020-05-03 ENCOUNTER — Other Ambulatory Visit: Payer: Self-pay

## 2020-05-03 ENCOUNTER — Telehealth: Payer: Self-pay | Admitting: Primary Care

## 2020-05-03 ENCOUNTER — Encounter: Payer: Self-pay | Admitting: Primary Care

## 2020-05-03 ENCOUNTER — Ambulatory Visit (INDEPENDENT_AMBULATORY_CARE_PROVIDER_SITE_OTHER): Payer: Medicare Other | Admitting: Primary Care

## 2020-05-03 DIAGNOSIS — J449 Chronic obstructive pulmonary disease, unspecified: Secondary | ICD-10-CM

## 2020-05-03 DIAGNOSIS — J9611 Chronic respiratory failure with hypoxia: Secondary | ICD-10-CM

## 2020-05-03 DIAGNOSIS — K219 Gastro-esophageal reflux disease without esophagitis: Secondary | ICD-10-CM | POA: Diagnosis not present

## 2020-05-03 MED ORDER — PANTOPRAZOLE SODIUM 40 MG PO TBEC: 40.0000 mg | DELAYED_RELEASE_TABLET | Freq: Every day | ORAL | 6 refills | Status: AC

## 2020-05-03 MED ORDER — FAMOTIDINE 20 MG PO TABS: 20.0000 mg | ORAL_TABLET | Freq: Every day | ORAL | 5 refills | Status: DC

## 2020-05-03 MED ORDER — ALBUTEROL SULFATE (2.5 MG/3ML) 0.083% IN NEBU: 2.5000 mg | INHALATION_SOLUTION | Freq: Four times a day (QID) | RESPIRATORY_TRACT | 6 refills | Status: AC | PRN

## 2020-05-03 NOTE — Assessment & Plan Note (Signed)
-  Current diet is worsening her reflux symptoms, she is eating large quantity of red hots  - Continue Pantoprazole 46m daily; add famotidine 237mat bedtime - Given patient education for GERD diet

## 2020-05-03 NOTE — Progress Notes (Signed)
_0  ID: Mary Jenkins, female    DOB: 1955/11/28, 64 y.o.   MRN: [id number]  Chief Complaint  Patient presents with  . Follow-up    Referring provider: Matilde Haymaker, MD  HPI: 64 year old female, former smoker quit in 2013.  Past medical history significant for COPD Gold 4.  Patient of Dr. Melvyn Novas, last seen on 03/17/20. Maintained on Breztri and chronic oxygen 3L.   05/03/2020- Interim hx Patient presents today for hospital follow-up. She was admitted from 04/21/20-04/25/20 for COPD exacerbation. She was originally placed on BIPAP and given atrovent/solumedrol. ABGs showed compensated respiratory acidosis. She was discharge on prednisone 68m/day but intended taper instructions were not relayed to patient. She saw PCP yesterday and was given taper instructions for prednisone.  She is feeling well today, sometimes she has difficulty with her breathing when she over exerts herself. Her symptoms are exacerbated by the heat and spicy food. She eats large quantities of hot gummy candy.  CXR 04/15/20 showed emphysema with no acute process. She can not afford pulmonary rehab. She is receiving home physical therapy twice a week.    Significant testing/events:  Alpha one genotype 02/13/12 > MM PFT's  08/29/17   FEV1 0.51 (22 % ) ratio 32  p 5 % improvement from saba p ? prior to study with DLCO  24 % corrects to 33 % for alv volume    - referred to rehab again 11/26/2017 > could not afford it  10/25/2019  After extensive coaching inhaler device,  effectiveness =    75% (short ti)  Try breztri 2 bid  - Prednisone x 6 days prn as plan D 03/17/2020 >>>  No Known Allergies  Immunization History  Administered Date(s) Administered  . H1N1 06/14/2011  . Influenza Split 06/14/2011, 07/21/2012  . Influenza, Seasonal, Injecte, Preservative Fre 06/24/2015  . Influenza,inj,Quad PF,6+ Mos 07/05/2013, 06/06/2014, 06/02/2015, 05/31/2016, 06/16/2017, 05/22/2018, 06/15/2019  . Moderna SARS-COVID-2 Vaccination  12/13/2019, 01/10/2020  . Pneumococcal Conjugate-13 10/14/2011  . Pneumococcal Polysaccharide-23 05/31/2016  . Tdap 02/03/2012, 08/14/2015    Past Medical History:  Diagnosis Date  . Breast cancer ([facility]   . Cataract, immature    bilateral  . COPD (chronic obstructive pulmonary disease) ([place]    no home O2  . Cough 02/09/2015  . DDD (degenerative disc disease), cervical   . Exertional shortness of breath    states if she "takes her time" doing activities does not get SOB  . Family history of adverse reaction to anesthesia    pt's sister has hx. of post-op N/V  . Full dentures   . GERD (gastroesophageal reflux disease) 02/03/2012  . High cholesterol    no current med.  .Marland KitchenHistory of breast cancer   . Personal history of chemotherapy 1997  . Runny nose 02/09/2015   clear drainage, per pt.    Tobacco History: Social History   Tobacco Use  Smoking Status Former Smoker  . Quit date: 07/23/2012  . Years since quitting: 7.7  Smokeless Tobacco Never Used   Counseling given: Not Answered   Outpatient Medications Prior to Visit  Medication Sig Dispense Refill  . albuterol (PROAIR HFA) 108 (90 Base) MCG/ACT inhaler Inhale 2 puffs into the lungs every 6 (six) hours as needed for wheezing or shortness of breath. 18 g 3  . aspirin EC 81 MG tablet Take 81 mg by mouth daily. Swallow whole.    . Budeson-Glycopyrrol-Formoterol (BREZTRI AEROSPHERE) 160-9-4.8 MCG/ACT AERO Inhale 2 puffs into the lungs 2 (two) times  daily. 10.7 g 0  . cycloSPORINE (RESTASIS) 0.05 % ophthalmic emulsion Place 1 drop into both eyes 2 (two) times daily.     . mirtazapine (REMERON) 15 MG tablet Take 1 tablet (15 mg total) by mouth at bedtime. 60 tablet 2  . OXYGEN Place 3 L into the nose continuous.     . predniSONE (DELTASONE) 20 MG tablet Take 2 tablets (40 mg total) by mouth daily with breakfast. 30 tablet 0  . predniSONE (DELTASONE) 5 MG tablet Take 1 tablet (5 mg total) by mouth daily with breakfast. 3 tablet  0  . valACYclovir (VALTREX) 1000 MG tablet TAKE 1 TABLET BY MOUTH TWICE DAILY FOR 3 DAYS AS NEEDED (Patient taking differently: Take 1,000 mg by mouth 2 (two) times daily as needed (for flare up). ) 30 tablet 5  . diphenhydrAMINE HCl, Sleep, (ZZZQUIL PO) Take 2 capsules by mouth at bedtime as needed (sleep).    . pantoprazole (PROTONIX) 40 MG tablet Take 1 tablet (40 mg total) by mouth daily. 30 tablet 0   No facility-administered medications prior to visit.    Review of Systems  Review of Systems  Constitutional: Negative.   HENT: Negative.  Congestion: Reflux.   Respiratory: Negative.        Dyspnea  Cardiovascular: Negative.     Physical Exam  BP 98/60 (BP Location: Right Arm, Cuff Size: Normal)   Pulse 98   Temp 98.1 F (36.7 C) (Temporal)   Ht 5' 5.5" (1.664 m)   Wt 113 lb 9.6 oz (51.5 kg)   SpO2 92%   BMI 18.62 kg/m  Physical Exam Constitutional:      Appearance: Normal appearance. She is normal weight.     Comments: Thin female  HENT:     Head: Normocephalic and atraumatic.  Cardiovascular:     Rate and Rhythm: Normal rate and regular rhythm.  Pulmonary:     Effort: Pulmonary effort is normal.     Breath sounds: Normal breath sounds. No wheezing, rhonchi or rales.     Comments: Clear, diminished  Musculoskeletal:        General: Normal range of motion.     Comments: In WC  Skin:    General: Skin is warm and dry.  Neurological:     General: No focal deficit present.     Mental Status: She is oriented to person, place, and time. Mental status is at baseline.  Psychiatric:        Mood and Affect: Mood normal.        Behavior: Behavior normal.        Thought Content: Thought content normal.        Judgment: Judgment normal.      Lab Results:  CBC    Component Value Date/Time   WBC 14.8 (H) 04/25/2020 0105   RBC 3.91 04/25/2020 0105   HGB 10.2 (L) 04/25/2020 0105   HGB 11.5 02/23/2020 1358   HCT 33.5 (L) 04/25/2020 0105   HCT 35.3 02/23/2020 1358     PLT 254 04/25/2020 0105   PLT 228 02/23/2020 1358   MCV 85.7 04/25/2020 0105   MCV 82 02/23/2020 1358   MCH 26.1 04/25/2020 0105   MCHC 30.4 04/25/2020 0105   RDW 17.9 (H) 04/25/2020 0105   RDW 15.2 02/23/2020 1358   LYMPHSABS 1.2 04/15/2020 2114   LYMPHSABS 1.6 02/23/2020 1358   MONOABS 1.0 04/15/2020 2114   EOSABS 0.0 04/15/2020 2114   EOSABS 0.2 02/23/2020 1358   BASOSABS  0.0 04/15/2020 2114   BASOSABS 0.1 02/23/2020 1358    BMET    Component Value Date/Time   NA 139 04/25/2020 0105   NA 140 12/03/2019 1148   K 4.2 04/25/2020 0105   CL 96 (L) 04/25/2020 0105   CO2 33 (H) 04/25/2020 0105   GLUCOSE 101 (H) 04/25/2020 0105   BUN 12 04/25/2020 0105   BUN 10 12/03/2019 1148   CREATININE 0.71 04/25/2020 0105   CREATININE 1.00 09/12/2015 1723   CALCIUM 8.6 (L) 04/25/2020 0105   GFRNONAA >60 04/25/2020 0105   GFRNONAA 73 12/07/2014 1130   GFRAA >60 04/25/2020 0105   GFRAA 84 12/07/2014 1130    BNP    Component Value Date/Time   BNP 35.3 01/05/2020 2127    ProBNP    Component Value Date/Time   PROBNP 96.9 06/14/2011 0240    Imaging: DG Chest 2 View  Result Date: 04/15/2020 CLINICAL DATA:  Dyspnea, COPD exacerbation EXAM: CHEST - 2 VIEW COMPARISON:  01/05/2020 FINDINGS: Frontal and lateral views of the chest demonstrate a stable cardiac silhouette. Lungs are hyperinflated with background emphysema. No airspace disease, effusion, or pneumothorax. No acute bony abnormalities. IMPRESSION: 1. Emphysema.  No acute process. Electronically Signed   By: Randa Ngo M.D.   On: 04/15/2020 20:19   DG Chest Portable 1 View  Result Date: 04/21/2020 CLINICAL DATA:  Shortness of breath EXAM: PORTABLE CHEST 1 VIEW COMPARISON:  None. FINDINGS: The heart size and mediastinal contours are within normal limits. There is hyperinflation of the upper lung zones with flattening of the hemidiaphragms. No large airspace consolidation or pleural effusion. Surgical clips are seen within  the right axilla. The visualized skeletal structures are unremarkable. IMPRESSION: No active disease.  Findings of COPD. Electronically Signed   By: Prudencio Pair M.D.   On: 04/21/2020 19:07     Assessment & Plan:   COPD GOLD IV - Frequent exacerbations- she has had four ED visits with two hospital admissions for COPD. Most recent admission was in July for AECOPD, discharged on 24m prednisone daily. She is doing better, continues to have dyspnea on exertion especially with heat/humiditity. GERD symptoms contributing to exacerbations.  - Continue Breztri two puffs twice daily; Albuterol hfa/ nebulizer every 6 hours for shortness of breath or wheezing  - Continue prednisone taper as instructed by PCP  - Use Incentive spirometer twice daily - May consider adding medication called Daliresp at next visit if you continue to have exacerbations of your COPD and GERD is controlled - discuss with Dr. WMelvyn Novas   GERD (gastroesophageal reflux disease) - Current diet is worsening her reflux symptoms, she is eating large quantity of red hots  - Continue Pantoprazole 470mdaily; add famotidine 2074mt bedtime - Given patient education for GERD diet    EliMartyn EhrichP 05/03/2020

## 2020-05-03 NOTE — Patient Instructions (Addendum)
Nice meeting you today Mary Jenkins  Recommendations: - Continue Breztri two puffs twice daily - Continue nebulizer every 6 hours for shortness of breath/wheezing  - Continue prednisone taper as instructed by PCP  - Continue to use Incentive spirometer twice daily - May consider adding medication called Daliresp at next visit if you continue to have exacerbations of your COPD and GERD is controlled - discuss with Dr. Melvyn Novas   GERD: - Continue Pantoprazole 68m daily - Add Pepcid at bedtime 249m - Follow GERD diet instructions below   Follow-up: Already scheduled for fu with Dr. WeMelvyn Novasext month    Food Choices for Gastroesophageal Reflux Disease, Adult When you have gastroesophageal reflux disease (GERD), the foods you eat and your eating habits are very important. Choosing the right foods can help ease your discomfort. Think about working with a nutrition specialist (dietitian) to help you make good choices. What are tips for following this plan?  Meals  Choose healthy foods that are low in fat, such as fruits, vegetables, whole grains, low-fat dairy products, and lean meat, fish, and poultry.  Eat small meals often instead of 3 large meals a day. Eat your meals slowly, and in a place where you are relaxed. Avoid bending over or lying down until 2-3 hours after eating.  Avoid eating meals 2-3 hours before bed.  Avoid drinking a lot of liquid with meals.  Cook foods using methods other than frying. Bake, grill, or broil food instead.  Avoid or limit: ? Chocolate. ? Peppermint or spearmint. ? Alcohol. ? Pepper. ? Black and decaffeinated coffee. ? Black and decaffeinated tea. ? Bubbly (carbonated) soft drinks. ? Caffeinated energy drinks and soft drinks.  Limit high-fat foods such as: ? Fatty meat or fried foods. ? Whole milk, cream, butter, or ice cream. ? Nuts and nut butters. ? Pastries, donuts, and sweets made with butter or shortening.  Avoid foods that cause symptoms.  These foods may be different for everyone. Common foods that cause symptoms include: ? Tomatoes. ? Oranges, lemons, and limes. ? Peppers. ? Spicy food. ? Onions and garlic. ? Vinegar. Lifestyle  Maintain a healthy weight. Ask your doctor what weight is healthy for you. If you need to lose weight, work with your doctor to do so safely.  Exercise for at least 30 minutes for 5 or more days each week, or as told by your doctor.  Wear loose-fitting clothes.  Do not smoke. If you need help quitting, ask your doctor.  Sleep with the head of your bed higher than your feet. Use a wedge under the mattress or blocks under the bed frame to raise the head of the bed. Summary  When you have gastroesophageal reflux disease (GERD), food and lifestyle choices are very important in easing your symptoms.  Eat small meals often instead of 3 large meals a day. Eat your meals slowly, and in a place where you are relaxed.  Limit high-fat foods such as fatty meat or fried foods.  Avoid bending over or lying down until 2-3 hours after eating.  Avoid peppermint and spearmint, caffeine, alcohol, and chocolate. This information is not intended to replace advice given to you by your health care provider. Make sure you discuss any questions you have with your health care provider. Document Revised: 12/31/2018 Document Reviewed: 10/15/2016 Elsevier Patient Education  20[place]

## 2020-05-03 NOTE — Assessment & Plan Note (Addendum)
-  Frequent exacerbations- she has had four ED visits with two hospital admissions for COPD. Most recent admission was in July for AECOPD, discharged on 60m prednisone daily. She is doing better, continues to have dyspnea on exertion especially with heat/humiditity. GERD symptoms contributing to exacerbations.  - Continue Breztri two puffs twice daily; Albuterol hfa/ nebulizer every 6 hours for shortness of breath or wheezing  - Continue prednisone taper as instructed by PCP  - Use Incentive spirometer twice daily - May consider adding medication called Daliresp at next visit if you continue to have exacerbations of your COPD and GERD is controlled - discuss with Dr. WMelvyn Novas

## 2020-05-03 NOTE — Telephone Encounter (Signed)
Spoke with the pt  She states that she spoke with [facility] today about ordering POC  Order was not placed  Can we go ahead and put this in? thanks

## 2020-05-04 NOTE — Telephone Encounter (Signed)
yes

## 2020-05-04 NOTE — Telephone Encounter (Signed)
Order sent to [facility] and pt made aware °

## 2020-05-15 ENCOUNTER — Other Ambulatory Visit: Payer: Self-pay | Admitting: Family Medicine

## 2020-05-15 DIAGNOSIS — D619 Aplastic anemia, unspecified: Secondary | ICD-10-CM

## 2020-05-15 DIAGNOSIS — D509 Iron deficiency anemia, unspecified: Secondary | ICD-10-CM

## 2020-05-15 MED ORDER — FERROUS SULFATE 324 (65 FE) MG PO TBEC: 1.0000 | DELAYED_RELEASE_TABLET | Freq: Every day | ORAL | 1 refills | Status: AC

## 2020-05-17 ENCOUNTER — Telehealth: Payer: Self-pay | Admitting: Primary Care

## 2020-05-17 MED ORDER — ROFLUMILAST 500 MCG PO TABS: 500.0000 ug | ORAL_TABLET | Freq: Every day | ORAL | 11 refills | Status: AC

## 2020-05-17 MED ORDER — DALIRESP 250 MCG PO TABS: 1.0000 | ORAL_TABLET | Freq: Every day | ORAL | 0 refills | Status: DC

## 2020-05-17 NOTE — Telephone Encounter (Signed)
Called and spoke with pt. Pt stated that she does want to begin taking Daliresp as discussed at last OV. Beth, please advise if you want to go ahead and send starter dose of 292mg to pharmacy and then work pt up to the 5091m.  [facility]

## 2020-05-17 NOTE — Telephone Encounter (Signed)
Spoke with the pt and rx was sent to pharm after verifying pharm

## 2020-05-17 NOTE — Telephone Encounter (Signed)
Yes, please start Daliresp 284mg once daily x 28 days; then increase to 5010m daily

## 2020-05-18 ENCOUNTER — Telehealth: Payer: Self-pay | Admitting: Primary Care

## 2020-05-18 NOTE — Telephone Encounter (Signed)
The daliresp was sent to her pharmacy 05/17/20- sent to [place] x 1

## 2020-05-19 ENCOUNTER — Other Ambulatory Visit: Payer: Self-pay | Admitting: Family Medicine

## 2020-05-22 NOTE — Telephone Encounter (Signed)
There isn't, we would need to start prior authorization or help her with patient assistance   Cc: pharamcy

## 2020-05-22 NOTE — Telephone Encounter (Signed)
Spoke with patient regarding prior message that her insurance is not covering [place].Mary Jenkins patient was wondering is there is alternative or generic that she can try. Mary Jenkins please advise.

## 2020-05-22 NOTE — Telephone Encounter (Signed)
Called and spoke with pt letting her know the info stated by [facility] that we would try to do a PA to see if we could get med approved and she verbalized understanding.  I see that both daliresp 268mg and daliresp 5059m were sent to the pharmacy for pt.  Rachael, do you know if a PA would need to be done for both doses?

## 2020-05-22 NOTE — Telephone Encounter (Signed)
Calling because daliresp is not covered by her insurance and would like to know if there is an alternate or generic that she can get instead. Pt can be reached at [phone]

## 2020-05-23 NOTE — Telephone Encounter (Signed)
[facility] only has 536mg dose in their system. Per test claim, Prior authorization is required. Likely same for 2535m dose.

## 2020-05-23 NOTE — Telephone Encounter (Signed)
We have received a PA from pt's pharmacy on both the 277mg as well as the 5041m.. these have been initiated via cover my meds.  Medication name and strength: Daliresp 25039mProvider: BetNikolskialVirgietient insurance ID: [id number]: [phone]: [id number] the PA started on CMM?  yes If yes, please enter the Key: [id number] Timeframe for approval/denial: This request has received a Favorable outcome.  Medication name and strength: Daliresp 500m69mrovider: BethOscodalmRiverdaleient insurance ID: [id number]: [phone]: [phone] the PA started on CMM?  yes If yes, please enter the Key: [id number] Timeframe for approval/denial: Information regarding your request This medication or product was previously approved on [id number] 2020-05-23 to 2020-09-22. **Please note: Formulary lowering, tiering exception, cost reduction and/or pre-benefit determination review (including prospective Medicare hospice reviews) requests cannot be requested using this method of submission. Please contact us [phone].  Called and spoke with pt letting her know that the PA was approved and she verbalized understanding. Nothing further needed.

## 2020-05-24 ENCOUNTER — Telehealth: Payer: Self-pay | Admitting: Primary Care

## 2020-05-24 NOTE — Telephone Encounter (Signed)
Spoke with the pt  She states that she got a call 05/23/20 from [place] stating that her Daliresp was approved, however now the pharm says they do not even have it  I called [facility] and was advised that they DO have the rx for pt and it is ready  I spoke with the pt and notified her of this  Nothing further needed

## 2020-05-25 NOTE — Telephone Encounter (Signed)
Chart review.

## 2020-05-30 ENCOUNTER — Ambulatory Visit: Payer: Medicare Other

## 2020-06-13 ENCOUNTER — Telehealth: Payer: Self-pay | Admitting: Internal Medicine

## 2020-06-13 NOTE — Telephone Encounter (Signed)
We can try but no guarantee they have a POC that can do this or be reiumbursed for it s going thru a visit to prove it's adequate for her needs

## 2020-06-13 NOTE — Telephone Encounter (Signed)
Called and spoke with pt who is requesting an order to be sent to [facility] for a POC on continuous flow. Pt said that she uses 3L continuous with exertion.  Dr. Melvyn Novas, please advise if you are okay with [place] placing this order to DME.

## 2020-06-14 NOTE — Telephone Encounter (Signed)
Called and spoke with pt stating to her the info from [facility]. Pt said that she does have an upcoming appt with MW 9/27 and will wait until that visit to further discuss this. Nothing further needed.

## 2020-06-19 ENCOUNTER — Ambulatory Visit (INDEPENDENT_AMBULATORY_CARE_PROVIDER_SITE_OTHER): Payer: Medicare Other | Admitting: Internal Medicine

## 2020-06-19 ENCOUNTER — Other Ambulatory Visit: Payer: Self-pay

## 2020-06-19 ENCOUNTER — Encounter: Payer: Self-pay | Admitting: Internal Medicine

## 2020-06-19 DIAGNOSIS — J449 Chronic obstructive pulmonary disease, unspecified: Secondary | ICD-10-CM | POA: Diagnosis not present

## 2020-06-19 DIAGNOSIS — J9612 Chronic respiratory failure with hypercapnia: Secondary | ICD-10-CM

## 2020-06-19 DIAGNOSIS — J9611 Chronic respiratory failure with hypoxia: Secondary | ICD-10-CM

## 2020-06-19 MED ORDER — PREDNISONE 10 MG PO TABS: ORAL_TABLET | ORAL | 0 refills | Status: DC

## 2020-06-19 NOTE — Assessment & Plan Note (Addendum)
04/13/18  Patient Saturations on Room Air at Rest = 92% Patient Saturations on [employer] while Ambulating = 87% Patient Saturations on 3 pulse Liters of oxygen while Ambulating = 93% - HC03   04/25/20  = 33  -  06/19/2020   Walked 3lpm POC  approx   125 ft  @ slow pace  stopped due to  Sob with sats 87% - 06/19/2020 referred to [facility] for best fit eval    Again advised: Make sure you check your oxygen saturations at highest level of activity to be sure it stays over 90% and adjust  02 flow upward to maintain this level if needed but remember to turn it back to previous settings when you stop (to conserve your supply).     May not be able to achieve the goal of 90% with activity on any POC but certainly not the one she's presently using so referred back to [facility] for best fit          Each maintenance medication was reviewed in detail including emphasizing most importantly the difference between maintenance and prns and under what circumstances the prns are to be triggered using an action plan format where appropriate.  Total time for H and P, chart review, counseling, teaching device  directly observing portions of ambulatory 02 saturation study/  and generating customized AVS unique to this office visit / charting  > 30 min

## 2020-06-19 NOTE — Progress Notes (Signed)
Subjective:    Patient ID: Mary Jenkins, female    DOB: November 25, 1955  MRN: [id number]    Brief patient profile:   64  yobf quit smoking  07/23/12 not taking breathing medications at that point and subsequently placed on multiple meds and still sob just getting dressed so referred from the medicine clinic at [facility] 02/13/2012 for pulmonary eval with documented GOLD IV COPD 02/2013    History of Present Illness  02/13/2012 1st pulmonary cc progressive worse x 6 months doe x 50-100 ft can't do a grocery store where could do before quit. No real variability, cough is better with no excess or purulent sputum.  Not using saba daytime because finds if she's holding still doesn't need it as oftern. Some better p last  Prednisone rx. Not much variability. rec Stop atrovent and advair Start symbiocort Take 2 puffs first thing in am and then another 2 puffs about 12 hours later.  Take after only am dose Spiriva Protonix 40 mg Take 30-60 min before first meal of the day  GERD diet Only use your albuterol (Plan B= ventolin puffer,  Plan C is nebulizer) as a rescue medication to be used if you can't catch your breath by resting or doing a relaxed purse lip breathing pattern. The less you use it, the better it will work when you need it. Ok to use rescue up to every 4 hours if doing poorly.       12/18/2017  f/u ov/Jamiesha Victoria re:  COPD IV/ symb/ spiriva tapering pred  Chief Complaint  Patient presents with  . Follow-up    SOB with activity, productive cough -yellow thick mucus, slight wheezing at night more   Dyspnea:  MMRC3 = can't walk 100 yards even at a slow pace at a flat grade s stopping due to sob  Cough: congested cough  Sleep: ok flat most noct SABA use:  Not using  In any form  Rec Work on inhaler technique:  Finish prednisone as you  plan  For cough > mucinex dm up to 1200 mg daily and use flutter valve as much as possible  Whenever cough flares should be on priloec 20 mg Take 30- 60 min before  your first and last meals of the day then ok when cough better for a week to leave the prilosec back off  Please schedule a follow up office visit in 4 weeks, sooner if needed  with all medications /inhalers/ solutions in hand so we can verify exactly what you are taking. This includes all medications from all doctors and over the counters     01/15/2018  f/u ov/Jmarion Christiano re: copd iv/ did not bring meds  Chief Complaint  Patient presents with  . Follow-up    Reports increased SOB since weather has changed. Reports her daughter used some kind of incense mist and her breathing has been worse every since. No improvement noted with neb or inhaler.   Dyspnea:  Still MMRC3 = can't walk 100 yards even at a slow pace at a flat grade s stopping due to sob   Cough: worse with certain foods / exp to incense / non prod Sleep:   No 02 / flat ok  SABA use:  Last used 2 weeks prior to OV   Feels much better on prednisone  And worse off it  Taking ppi hs       07/14/2018  f/u ov/Syrus Nakama re:  Copd GOLD IV/ 02 dep / ? slt flare p  uri exp  Chief Complaint  Patient presents with  . Follow-up    Increased SOB recently- relates to her grandson having a cold. She has some chest tightness. She is using her albuterol inhaler 2 x daily and rarely use her neb.    Dyspnea:  Foodlion on 3lpm poc but does not check sats Cough: min cough / nothing purulent Sleeping: flat / no pillows SABA use: as above/ chest tightness better p saba though hfa ti too short - see a/p  02: 2lpm at hs / 3lpm poc  / going back to school and present POC too noisy so turns it off then rec Spiriva should be 4 puffs of the blue or 2 pffs of the green  Tipped  spiriva each am Prednisone 10 mg take  4 each am x 2 days,   2 each am x 2 days,  1 each am x 2 days and stop   Continue omeprazole 20 mg Take 30-60 min before first meal of the day but add another dose 30 min supper for respiratory flares Plan B = Backup Only use your albuterol (ventolin)  as a rescue medication  Plan C = Crisis - only use your albuterol nebulizer if you first try Plan B and it fails to help > ok to use the nebulizer up to every 4 hours but if start needing it regularly call for immediate appointment   10/21/2018  f/u ov/Vickki Igou re:  Copd GOLD IV /  Chief Complaint  Patient presents with  . Follow-up    Pt c/o increased cough and SOB over the past month.  She is using the ventolin inhaler multiple times per day. She rarely uses neb.    Dyspnea:  Last food lion x few months Cough: more congestion/ esp in am and hs  Sleeping: prop up 30 degrees vs before  SABA use: as above, much more since ran out of spiriva  02: 2lpm hs,  None sitting,  2-3 poc  rec Plan A = Automatic = symbicort / spiriva 2 pffs of each first thing in am then 12 hours later symbicort Plan B = Backup Only use your albuterol inhaler as a rescue medication Plan C = Crisis - only use your albuterol nebulizer if you first try Plan B and it fails to help > ok to use the nebulizer up to every 4 hours but if start needing it regularly call for immediate appointment Prednisone 10 mg take  4 each am x 2 days,   2 each am x 2 days,  1 each am x 2 days and stop    Date of Admission: 01/11/2019    Date of Discharge: 01/12/2019    Discharge Diagnosis: 1.  Acute exacerbation of COPD    TAKE these medications   albuterol (2.5 MG/3ML) 0.083% nebulizer solution Commonly known as:  PROVENTIL USE 3 ML IN NEBULIZER  EVERY 6 HOURS AS NEEDED FOR WHEEZING OR SHORTNESS OF BREATH What changed:  See the new instructions.   Ventolin HFA 108 (90 Base) MCG/ACT inhaler Generic drug:  albuterol INHALE 2 PUFFS BY MOUTH EVERY 6 HOURS AS NEEDED FOR WHEEZING What changed:    how much to take  how to take this  when to take this  reasons to take this  additional instructions   aspirin EC 81 MG tablet Take 81 mg by mouth daily.   diclofenac sodium 1 % Gel Commonly known as:  Voltaren Apply 2 g  topically 4 (four) times daily.  What changed:    when to take this  reasons to take this   omeprazole 20 MG capsule Commonly known as:  PRILOSEC Take 1 capsule (20 mg total) by mouth 2 (two) times daily before a meal.   OXYGEN Place 2-3 L into the nose 2 (two) times a day. 2lpm with rest and 3 with exertion   predniSONE 20 MG tablet Commonly known as:  DELTASONE Take 2 tablets (40 mg total) by mouth daily with breakfast. Start taking on:  January 13, 2019   pregabalin 75 MG capsule Commonly known as:  Lyrica Take 1 capsule (75 mg total) by mouth 2 (two) times daily.   Restasis 0.05 % ophthalmic emulsion Generic drug:  cycloSPORINE Place 2 drops into both eyes daily.   Symbicort 160-4.5 MCG/ACT inhaler Generic drug:  budesonide-formoterol Inhale 2 puffs by mouth twice daily   Tiotropium Bromide Monohydrate 2.5 MCG/ACT Aers Commonly known as:  Spiriva Respimat Inhale 2 puffs into the lungs daily.   valACYclovir 1000 MG tablet Commonly known as:  VALTREX TAKE ONE TABLET BY MOUTH TWICE DAILY FOR 3 DAYS AS NEEDED What changed:    how much to take  how to take this  when to take this  reasons to take this  additional instructions       Disposition and follow-up:   Ms.Emmaclaire KAELAH HAYASHI was discharged from [facility] in [place] condition.  At the hospital follow up visit please address:  1.  Acute exacerbation of COPD: Ensure compliance with nebulizer and inhaler.  Abstain from secondhand smoke.  2.  Labs / imaging needed at time of follow-up: None  3.  Pending labs/ test needing follow-up: None  Follow-up Appointments:  Hospital Course by problem list: 1. Acute exacerbation of COPDGOLD IV: Ms. Rzasa is a 64 year old African-American woman with oxygen dependent COPDGOLD IV on 2L [place],GERD, cervical degenerative joint disease who presented to [facility] emergency department on November 20, 2018 with a 1 day history of worsening  dyspnea on exertion however she denied cough, sputum production, fevers, chills, sick contact, URI symptoms, chest pain, lightheadedness,calf pain.  Her acute onset dyspnea did not appear to be secondary to pneumonia, pulmonary embolism, acute coronary syndrome or COVID-19.  She did report of exposure to secondhand smoking.  On route to the hospital she received intramuscular epinephrine, albuterol, DuoNebs, Solu-Medrol, magnesium sulfate complete resolution of symptoms.  During her hospitalization she maintain excellent oxygen saturation on her home supplemental oxygen.  She was continued on inhalers and discharged on a 5-day course of prednisone.             01/22/2019  f/u ov/Nesha Counihan re: GOLD IV copd/ 02 dep/ s/p admit p 5 days of increased ventolin need and did not call for appt as rec prior to 911 and says "wasn't able to get to nebulizer in time" which denies using at all  in the 5 days leading up to admit and was exposed to daughter's cig smoke may have tipped her over.  Chief Complaint  Patient presents with  . Acute Visit    Pt c/o chest tightness since d/c from hospital 01/12/2019.   breathing was improved when arrived home as was the chest tightness but worsened p completed p finished prednisone course which was much  shorter than usual  Dyspnea:  Able to get room to room on 2lpm does not titrate  Cough: none Sleeping: able to lie flat bed on one pillow ok  SABA use: not understanding  how/ when to use various inhalers  = proair, dulera/combivent/ventolin 02: 2lpm hs  rec  Plan A = Automatic = symbicort 160 Take 2 puffs first thing in am and then another 2 puffs about 12 hours later and spiriva 2 pffs each am  Work on inhaler technique:    Plan B = Backup Only use your albuterol (proair or ventolin) inhaler Plan C = Crisis - only use your albuterol nebulizer if you first try Plan B and it fails to help > ok to use the nebulizer up to every 4 hours but if start needing it regularly  call for immediate appointment Prednisone 10 mg Take 4 for three days 3 for three days 2 for three days 1 for three days and stop Omeprazole 20 mg Take 30- 60 min before your first and last meals of the day     04/21/2019  f/u ov/Trevion Hoben re:  [place] Chief Complaint  Patient presents with  . Follow-up    Breathing is overall doing well. She uses her albuterol inhaler about once per wk on average. She has not been using her neb.    Dyspnea:  Very sedentary  But doing FL  On 3lpm poc  = MMRC3 = can't walk 100 yards even at a slow pace at a flat grade s stopping due to sob   Cough: none  Sleeping: ok flat/ one pillow  SABA use: much need for saba  02: 3lpm cont at home and pulsed when out  rec No change rx    09/02/19 aecopd rx pred/ zpak > much better   10/25/2019  f/u ov/Shonya Sumida re: GOLD IV having trouble keeping up with spiriva and symb Chief Complaint  Patient presents with  . Follow-up    Breathing has been progressively worse over the past 2 months. She states she sometimes gets SOB just eating. She is using her proair 5 x per day on average and albuterol neb about 2 x per wk.   Dyspnea:  Across the room  = MMRC4  = sob if tries to leave home or while getting dressed   Cough: smoker's rattle, worse in am but denies smoking  Sleeping: flat, / one pillow SABA use: as above  02: 3lpm 24/7  rec Work on inhaler technique  Plan A = Automatic = Always=   Breztri Take 2 puffs first thing in am and then another 2 puffs about 12 hours later.  Prednisone 10 mg take  4 each am x 2 days,   2 each am x 2 days,  1 each am x 2 days and stop  Plan B = Backup (to supplement plan A, not to replace it) Only use your albuterol inhaler (proair)as a rescue medication Plan C = Crisis (instead of Plan B but only if Plan B stops working) - only use your albuterol nebulizer if you first try Plan B and it fails to help > ok to use the nebulizer up to every 4 hours but if start needing it regularly call for  immediate appointment   12/06/2019  f/u ov/Antonia Culbertson re:  GOLD IV / 02 dep maint on breztri 2 bid  Chief Complaint  Patient presents with  . Follow-up    Breathing has improved some since the last visit. She is using her albuterol inhaler 1-2 x per wk. She has used neb only once since the last visit.   Dyspnea:  Room to room s walker on 3lpm with activity   Cough: no  purulent / excess mucus  Sleeping: flat/ one pillow SABA use: as above 02: 3lpm at hs/ not at rest / up to 3lpm with activity, typically none at rest with sats 94%   rec No change in medications We will try to get you some home Respiratory Rehabilitation help.    03/17/2020  f/u ov/Sherre Wooton re:  GOLD IV / 02 dep Breztri  Chief Complaint  Patient presents with  . Follow-up    Pt states breathing has been slightly worse since her last visit and has had to go to ED x 2 for SOB. She has cough with clear sputum. She states that she rarely uses her albuterol.    Dyspnea:  Room to room / not checking sats  Cough: minimal mucoid  Sleeping: ok flat one pillow  SABA use: poor hfa/ uses neb sev times a day  02: 3lpm hs and does not use daytime prn inappropriately  (takes it off when's she's sob and takes "the other machine instead " = neb pred always helps reduce saba dep in past rec No 02 needed at rest unless you have air hunger or sats less than 90%  Work on inhaler technique:   Plan A = Automatic = Always=   Breztri Take 2 puffs first thing in am and then another 2 puffs about 12 hours later.  Work on inhaler technique:   Plan B = Backup (to supplement plan A, not to replace it) Only use your albuterol inhaler as a rescue medication   Plan C = Crisis (instead of Plan B but only if Plan B stops working) - only use your albuterol nebulizer if you first try Plan B and it fails to help > ok to use the nebulizer up to every 4 hours but if start needing it regularly call for immediate appointment Plan D = Deltasone - go ahead if C not  working great  - Prednisone 10 mg take  4 each am x 2 days,   2 each am x 2 days,  1 each am x 2 days and stop  Please schedule a follow up visit in 3 months but call sooner if needed       06/19/2020  f/u ov/Javier Gell re: GOLD IV / 02 dep/ breztri Chief Complaint  Patient presents with  . Follow-up  Dyspnea:  Rollator/ room to room x 2 minutes but not checking sats while walking  Cough: none Sleeping: flat one pillow SABA use: not needing  02: 3lpm hs/ 3lpm at rest 98% / when on POC 3lpm dropping to 87% slow pace     No obvious day to day or daytime variability or assoc excess/ purulent sputum or mucus plugs or hemoptysis or cp or chest tightness, subjective wheeze or overt sinus or hb symptoms.   Sleeping as above  without nocturnal  or early am exacerbation  of respiratory  c/o's or need for noct saba. Also denies any obvious fluctuation of symptoms with weather or environmental changes or other aggravating or alleviating factors except as outlined above   No unusual exposure hx or h/o childhood pna/ asthma or knowledge of premature birth.  Current Allergies, Complete Past Medical History, Past Surgical History, Family History, and Social History were reviewed in [employer] record.  ROS  The following are not active complaints unless bolded Hoarseness, sore throat, dysphagia, dental problems, itching, sneezing,  nasal congestion or discharge of excess mucus or purulent secretions, ear ache,   fever, chills, sweats, unintended  wt loss or wt gain, classically pleuritic or exertional cp,  orthopnea pnd or arm/hand swelling  or leg swelling, presyncope, palpitations, abdominal pain, anorexia, nausea, vomiting, diarrhea  or change in bowel habits or change in bladder habits, change in stools or change in urine, dysuria, hematuria,  rash, arthralgias, visual complaints, headache, numbness, weakness or ataxia or problems with walking or coordination,  change in mood or   memory.        Current Meds  Medication Sig  . albuterol (PROAIR HFA) 108 (90 Base) MCG/ACT inhaler Inhale 2 puffs into the lungs every 6 (six) hours as needed for wheezing or shortness of breath.  Marland Kitchen albuterol (PROVENTIL) (2.5 MG/3ML) 0.083% nebulizer solution Take 3 mLs (2.5 mg total) by nebulization every 6 (six) hours as needed for wheezing or shortness of breath.  Marland Kitchen aspirin EC 81 MG tablet Take 81 mg by mouth daily. Swallow whole.  . Budeson-Glycopyrrol-Formoterol (BREZTRI AEROSPHERE) 160-9-4.8 MCG/ACT AERO Inhale 2 puffs into the lungs 2 (two) times daily.  . cycloSPORINE (RESTASIS) 0.05 % ophthalmic emulsion Place 1 drop into both eyes 2 (two) times daily.   . famotidine (PEPCID) 20 MG tablet Take 1 tablet (20 mg total) by mouth at bedtime.  . ferrous sulfate 324 (65 Fe) MG TBEC Take 1 tablet (325 mg total) by mouth daily. OK to take every other day if it is constipating.  . mirtazapine (REMERON) 15 MG tablet TAKE 1 TABLET BY MOUTH AT BEDTIME  . OXYGEN Place 3 L into the nose continuous.   . pantoprazole (PROTONIX) 40 MG tablet Take 1 tablet (40 mg total) by mouth daily.  . predniSONE (DELTASONE) 20 MG tablet Take 2 tablets (40 mg total) by mouth daily with breakfast.  . predniSONE (DELTASONE) 5 MG tablet Take 1 tablet (5 mg total) by mouth daily with breakfast.  . Roflumilast (DALIRESP) 250 MCG TABS Take 1 tablet by mouth daily.  . roflumilast (DALIRESP) 500 MCG TABS tablet Take 1 tablet (500 mcg total) by mouth daily.  . valACYclovir (VALTREX) 1000 MG tablet TAKE 1 TABLET BY MOUTH TWICE DAILY FOR 3 DAYS AS NEEDED (Patient taking differently: Take 1,000 mg by mouth 2 (two) times daily as needed (for flare up). )                    Objective:   Physical Exam   Thin amb bf nad   06/19/2020    112 03/17/2020    100 12/06/2019    94 10/25/2019      95  04/21/2019    99  01/22/2019      94 10/21/2018    99  Wt 120 02/13/2012  > 119 03/13/2012 > 04/22/2012 117 > 07/21/2012 122>  12/02/2012  131> 03/22/2013  131 > 04/19/2013  129  > 07/19/2013 131 > 06/06/2014 129 >  11/11/2014  132> 02/28/2015   129 > 01/16/2016  115 >  02/29/2016  110 > 05/31/2016  115  > 11/21/2016   117 > 02/21/2017   107 > 06/16/2017 102 > 08/25/2017   106 > 11/26/2017 99 > 12/18/2017 >  01/15/2018  98 > 07/14/2018 104    Vital signs reviewed  06/19/2020  - Note at rest 02 sats  90% on 3lpm POC      Report : full dentures   HEENT : pt wearing mask not removed for exam due to covid -19 concerns.    NECK :  without JVD/Nodes/TM/ nl carotid upstrokes bilaterally  LUNGS: no acc muscle use,  Mod barrel  contour chest wall with bilateral  Distant bs s audible wheeze and  without cough on insp or exp maneuvers and mod  Hyperresonant  to  percussion bilaterally     CV:  RRR  no s3 or murmur or increase in P2, and no edema   ABD:  soft and nontender with pos mid insp Hoover's  in the supine position. No bruits or organomegaly appreciated, bowel sounds nl  MS:     ext warm without deformities, calf tenderness, cyanosis or clubbing No obvious joint restrictions   SKIN: warm and dry without lesions    NEURO:  alert, approp, nl sensorium with  no motor or cerebellar deficits apparent.        I personally reviewed images and agree with radiology impression as follows:  CXR:   Portable  04/21/20  No active disease.  Findings of COPD.      Assessment & Plan:

## 2020-06-19 NOTE — Patient Instructions (Addendum)
Make sure you check your oxygen saturations at highest level of activity to be sure it stays over 90% and adjust  02 flow upward to maintain this level if needed but remember to turn it back to previous settings when you stop (to conserve your supply).   Plan A = Automatic = Always=   Breztri Take 2 puffs first thing in am and then another 2 puffs about 12 hours later.   Work on inhaler technique:  relax and gently blow all the way out then take a nice smooth deep breath back in, triggering the inhaler at same time you start breathing in.  Hold for up to 5 seconds if you can. Blow out thru nose. Rinse and gargle with water when done   Plan B = Backup (to supplement plan A, not to replace it) Only use your albuterol inhaler as a rescue medication to be used if you can't catch your breath by resting or doing a relaxed purse lip breathing pattern.  - The less you use it, the better it will work when you need it. - Ok to use the inhaler up to 2 puffs  every 4 hours if you must but call for appointment if use goes up over your usual need - Don't leave home without it !!  (think of it like the spare tire for your car)   Plan C = Crisis (instead of Plan B but only if Plan B stops working) - only use your albuterol nebulizer if you first try Plan B and it fails to help > ok to use the nebulizer up to every 4 hours but if start needing it regularly call for immediate appointment   Plan D = Deltasone - go ahead if C not working great  - Prednisone 10 mg take  4 each am x 2 days,   2 each am x 2 days,  1 each am x 2 days and stop   Please schedule a follow up visit in 6 months but call sooner if needed

## 2020-06-19 NOTE — Assessment & Plan Note (Signed)
Quit smoking 06/2012 PFT performed on 06/27/2011. Poor quality.    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 prior to study with DLCO  24 % corrects to 33 % for alv volume    - referred to rehab again 11/26/2017 > could not afford it  - 10/25/2019  Try breztri 2 bid  - Prednisone x 6 days prn as plan D 03/17/2020 >>> - 06/19/2020  After extensive coaching inhaler device,  effectiveness =  75%  > continue breztri and increased daliresp to 500 mg daily if tolerates       Group D in terms of symptom/risk and laba/lama/ICS  therefore appropriate rx at this point >>>  Continue breztri and daliresp with prn saba  I spent extra time with pt today reviewing appropriate use of albuterol for prn use on exertion with the following points: 1) saba is for relief of sob that does not improve by walking a slower pace or resting but rather if the pt does not improve after trying this first. 2) If the pt is convinced, as many are, that saba helps recover from activity faster then it's easy to tell if this is the case by re-challenging : ie stop, take the inhaler, then p 5 minutes try the exact same activity (intensity of workload) that just caused the symptoms and see if they are substantially diminished or not after saba 3) if there is an activity that reproducibly causes the symptoms, try the saba 15 min before the activity  on alternate days   If in fact the saba really does help, then fine to continue to use it prn but advised may need to look closer at the maintenance regimen being used to achieve better control of airways disease with exertion.

## 2020-07-11 ENCOUNTER — Ambulatory Visit (INDEPENDENT_AMBULATORY_CARE_PROVIDER_SITE_OTHER): Payer: Medicare Other

## 2020-07-11 ENCOUNTER — Other Ambulatory Visit: Payer: Self-pay

## 2020-07-11 DIAGNOSIS — Z23 Encounter for immunization: Secondary | ICD-10-CM

## 2020-07-11 NOTE — Progress Notes (Signed)
Flu Vaccine administered LD without complication.   See admin for details.

## 2020-08-10 ENCOUNTER — Ambulatory Visit: Payer: Medicare Other

## 2020-08-11 ENCOUNTER — Emergency Department (HOSPITAL_COMMUNITY): Payer: Medicare Other

## 2020-08-11 ENCOUNTER — Encounter (HOSPITAL_COMMUNITY): Payer: Self-pay | Admitting: Emergency Medicine

## 2020-08-11 ENCOUNTER — Inpatient Hospital Stay (HOSPITAL_COMMUNITY)
Admission: EM | Admit: 2020-08-11 | Discharge: 2020-08-16 | DRG: 543 | Disposition: A | Discharge status: Home Health | Payer: Medicare Other | Attending: Family Medicine | Admitting: Family Medicine

## 2020-08-11 ENCOUNTER — Other Ambulatory Visit: Payer: Self-pay

## 2020-08-11 DIAGNOSIS — J449 Chronic obstructive pulmonary disease, unspecified: Secondary | ICD-10-CM | POA: Diagnosis present

## 2020-08-11 DIAGNOSIS — Z515 Encounter for palliative care: Secondary | ICD-10-CM

## 2020-08-11 DIAGNOSIS — D649 Anemia, unspecified: Secondary | ICD-10-CM | POA: Diagnosis present

## 2020-08-11 DIAGNOSIS — Z833 Family history of diabetes mellitus: Secondary | ICD-10-CM

## 2020-08-11 DIAGNOSIS — M4854XA Collapsed vertebra, not elsewhere classified, thoracic region, initial encounter for fracture: Secondary | ICD-10-CM | POA: Diagnosis not present

## 2020-08-11 DIAGNOSIS — Z823 Family history of stroke: Secondary | ICD-10-CM

## 2020-08-11 DIAGNOSIS — F329 Major depressive disorder, single episode, unspecified: Secondary | ICD-10-CM | POA: Diagnosis present

## 2020-08-11 DIAGNOSIS — S22000A Wedge compression fracture of unspecified thoracic vertebra, initial encounter for closed fracture: Secondary | ICD-10-CM | POA: Diagnosis present

## 2020-08-11 DIAGNOSIS — K219 Gastro-esophageal reflux disease without esophagitis: Secondary | ICD-10-CM | POA: Diagnosis present

## 2020-08-11 DIAGNOSIS — H269 Unspecified cataract: Secondary | ICD-10-CM | POA: Diagnosis present

## 2020-08-11 DIAGNOSIS — Z9981 Dependence on supplemental oxygen: Secondary | ICD-10-CM

## 2020-08-11 DIAGNOSIS — Z8049 Family history of malignant neoplasm of other genital organs: Secondary | ICD-10-CM

## 2020-08-11 DIAGNOSIS — Z20822 Contact with and (suspected) exposure to covid-19: Secondary | ICD-10-CM | POA: Diagnosis present

## 2020-08-11 DIAGNOSIS — Z79899 Other long term (current) drug therapy: Secondary | ICD-10-CM

## 2020-08-11 DIAGNOSIS — J9612 Chronic respiratory failure with hypercapnia: Secondary | ICD-10-CM | POA: Diagnosis present

## 2020-08-11 DIAGNOSIS — M8588 Other specified disorders of bone density and structure, other site: Secondary | ICD-10-CM | POA: Diagnosis present

## 2020-08-11 DIAGNOSIS — M549 Dorsalgia, unspecified: Secondary | ICD-10-CM | POA: Diagnosis present

## 2020-08-11 DIAGNOSIS — R52 Pain, unspecified: Secondary | ICD-10-CM

## 2020-08-11 DIAGNOSIS — Z9221 Personal history of antineoplastic chemotherapy: Secondary | ICD-10-CM

## 2020-08-11 DIAGNOSIS — Z7952 Long term (current) use of systemic steroids: Secondary | ICD-10-CM

## 2020-08-11 DIAGNOSIS — Z8249 Family history of ischemic heart disease and other diseases of the circulatory system: Secondary | ICD-10-CM

## 2020-08-11 DIAGNOSIS — M79652 Pain in left thigh: Secondary | ICD-10-CM | POA: Diagnosis present

## 2020-08-11 DIAGNOSIS — Z853 Personal history of malignant neoplasm of breast: Secondary | ICD-10-CM

## 2020-08-11 DIAGNOSIS — Z7982 Long term (current) use of aspirin: Secondary | ICD-10-CM

## 2020-08-11 DIAGNOSIS — M47812 Spondylosis without myelopathy or radiculopathy, cervical region: Secondary | ICD-10-CM | POA: Diagnosis present

## 2020-08-11 DIAGNOSIS — Z9011 Acquired absence of right breast and nipple: Secondary | ICD-10-CM

## 2020-08-11 DIAGNOSIS — J9611 Chronic respiratory failure with hypoxia: Secondary | ICD-10-CM | POA: Diagnosis present

## 2020-08-11 DIAGNOSIS — Z7951 Long term (current) use of inhaled steroids: Secondary | ICD-10-CM

## 2020-08-11 DIAGNOSIS — Z7189 Other specified counseling: Secondary | ICD-10-CM

## 2020-08-11 DIAGNOSIS — M810 Age-related osteoporosis without current pathological fracture: Secondary | ICD-10-CM | POA: Diagnosis present

## 2020-08-11 DIAGNOSIS — Z87891 Personal history of nicotine dependence: Secondary | ICD-10-CM

## 2020-08-11 DIAGNOSIS — G8929 Other chronic pain: Secondary | ICD-10-CM | POA: Diagnosis present

## 2020-08-11 MED ORDER — OXYCODONE-ACETAMINOPHEN 5-325 MG PO TABS
1.0000 | ORAL_TABLET | ORAL | Status: DC | PRN
  Administered 2020-08-11: 1 via ORAL
  Filled 2020-08-11 (×2): qty 1

## 2020-08-11 NOTE — ED Triage Notes (Signed)
Pt presents to ED BIB [facility] from home. Pt c/o L thigh pain x2w. Pt denies swelling, reports that it is warm and pain to touch. L pedal pulse +1.

## 2020-08-12 ENCOUNTER — Emergency Department (HOSPITAL_COMMUNITY): Payer: Medicare Other

## 2020-08-12 DIAGNOSIS — M79652 Pain in left thigh: Secondary | ICD-10-CM | POA: Diagnosis present

## 2020-08-12 DIAGNOSIS — M8588 Other specified disorders of bone density and structure, other site: Secondary | ICD-10-CM | POA: Diagnosis present

## 2020-08-12 DIAGNOSIS — R52 Pain, unspecified: Secondary | ICD-10-CM | POA: Diagnosis not present

## 2020-08-12 DIAGNOSIS — M4854XA Collapsed vertebra, not elsewhere classified, thoracic region, initial encounter for fracture: Secondary | ICD-10-CM | POA: Diagnosis present

## 2020-08-12 DIAGNOSIS — Z79899 Other long term (current) drug therapy: Secondary | ICD-10-CM | POA: Diagnosis not present

## 2020-08-12 DIAGNOSIS — Z853 Personal history of malignant neoplasm of breast: Secondary | ICD-10-CM | POA: Diagnosis not present

## 2020-08-12 DIAGNOSIS — Z9221 Personal history of antineoplastic chemotherapy: Secondary | ICD-10-CM | POA: Diagnosis not present

## 2020-08-12 DIAGNOSIS — J9612 Chronic respiratory failure with hypercapnia: Secondary | ICD-10-CM | POA: Diagnosis present

## 2020-08-12 DIAGNOSIS — S22000A Wedge compression fracture of unspecified thoracic vertebra, initial encounter for closed fracture: Secondary | ICD-10-CM | POA: Diagnosis not present

## 2020-08-12 DIAGNOSIS — R609 Edema, unspecified: Secondary | ICD-10-CM

## 2020-08-12 DIAGNOSIS — M810 Age-related osteoporosis without current pathological fracture: Secondary | ICD-10-CM | POA: Diagnosis present

## 2020-08-12 DIAGNOSIS — M549 Dorsalgia, unspecified: Secondary | ICD-10-CM | POA: Diagnosis present

## 2020-08-12 DIAGNOSIS — Z515 Encounter for palliative care: Secondary | ICD-10-CM

## 2020-08-12 DIAGNOSIS — Z87891 Personal history of nicotine dependence: Secondary | ICD-10-CM | POA: Diagnosis not present

## 2020-08-12 DIAGNOSIS — F329 Major depressive disorder, single episode, unspecified: Secondary | ICD-10-CM | POA: Diagnosis present

## 2020-08-12 DIAGNOSIS — H269 Unspecified cataract: Secondary | ICD-10-CM | POA: Diagnosis present

## 2020-08-12 DIAGNOSIS — M546 Pain in thoracic spine: Secondary | ICD-10-CM | POA: Diagnosis not present

## 2020-08-12 DIAGNOSIS — Z9981 Dependence on supplemental oxygen: Secondary | ICD-10-CM | POA: Diagnosis not present

## 2020-08-12 DIAGNOSIS — Z7951 Long term (current) use of inhaled steroids: Secondary | ICD-10-CM | POA: Diagnosis not present

## 2020-08-12 DIAGNOSIS — K219 Gastro-esophageal reflux disease without esophagitis: Secondary | ICD-10-CM | POA: Diagnosis present

## 2020-08-12 DIAGNOSIS — Z8049 Family history of malignant neoplasm of other genital organs: Secondary | ICD-10-CM | POA: Diagnosis not present

## 2020-08-12 DIAGNOSIS — Z823 Family history of stroke: Secondary | ICD-10-CM | POA: Diagnosis not present

## 2020-08-12 DIAGNOSIS — Z20822 Contact with and (suspected) exposure to covid-19: Secondary | ICD-10-CM | POA: Diagnosis present

## 2020-08-12 DIAGNOSIS — G8929 Other chronic pain: Secondary | ICD-10-CM | POA: Diagnosis present

## 2020-08-12 DIAGNOSIS — Z7189 Other specified counseling: Secondary | ICD-10-CM

## 2020-08-12 DIAGNOSIS — D649 Anemia, unspecified: Secondary | ICD-10-CM | POA: Diagnosis present

## 2020-08-12 DIAGNOSIS — Z7982 Long term (current) use of aspirin: Secondary | ICD-10-CM | POA: Diagnosis not present

## 2020-08-12 DIAGNOSIS — J449 Chronic obstructive pulmonary disease, unspecified: Secondary | ICD-10-CM

## 2020-08-12 DIAGNOSIS — J9611 Chronic respiratory failure with hypoxia: Secondary | ICD-10-CM | POA: Diagnosis present

## 2020-08-12 DIAGNOSIS — M47812 Spondylosis without myelopathy or radiculopathy, cervical region: Secondary | ICD-10-CM | POA: Diagnosis present

## 2020-08-12 DIAGNOSIS — Z9011 Acquired absence of right breast and nipple: Secondary | ICD-10-CM | POA: Diagnosis not present

## 2020-08-12 LAB — COMPREHENSIVE METABOLIC PANEL
ALT: 14 U/L (ref 0–44)
AST: 23 U/L (ref 15–41)
Albumin: 3.8 g/dL (ref 3.5–5.0)
Alkaline Phosphatase: 51 U/L (ref 38–126)
Anion gap: 13 (ref 5–15)
BUN: 15 mg/dL (ref 8–23)
CO2: 28 mmol/L (ref 22–32)
Calcium: 9.2 mg/dL (ref 8.9–10.3)
Chloride: 101 mmol/L (ref 98–111)
Creatinine, Ser: 0.88 mg/dL (ref 0.44–1.00)
GFR, Estimated: >60 mL/min (ref >60)
Glucose, Bld: 89 mg/dL (ref 70–99)
Potassium: 3.6 mmol/L (ref 3.5–5.1)
Sodium: 142 mmol/L (ref 135–145)
Total Bilirubin: 0.5 mg/dL (ref 0.3–1.2)
Total Protein: 6.9 g/dL (ref 6.5–8.1)

## 2020-08-12 LAB — CBC WITH DIFFERENTIAL/PLATELET
Abs Immature Granulocytes: 0.06 10*3/uL (ref 0.00–0.07)
Basophils Absolute: 0 10*3/uL (ref 0.0–0.1)
Basophils Relative: 0 %
Eosinophils Absolute: 0.1 10*3/uL (ref 0.0–0.5)
Eosinophils Relative: 1 %
HCT: 38.5 % (ref 36.0–46.0)
Hemoglobin: 11.3 g/dL — ABNORMAL LOW (ref 12.0–15.0)
Immature Granulocytes: 1 %
Lymphocytes Relative: 30 %
Lymphs Abs: 2.7 10*3/uL (ref 0.7–4.0)
MCH: 25.3 pg — ABNORMAL LOW (ref 26.0–34.0)
MCHC: 29.4 g/dL — ABNORMAL LOW (ref 30.0–36.0)
MCV: 86.1 fL (ref 80.0–100.0)
Monocytes Absolute: 0.9 10*3/uL (ref 0.1–1.0)
Monocytes Relative: 10 %
Neutro Abs: 5.3 10*3/uL (ref 1.7–7.7)
Neutrophils Relative %: 58 %
Platelets: 320 10*3/uL (ref 150–400)
RBC: 4.47 MIL/uL (ref 3.87–5.11)
RDW: 15.3 % (ref 11.5–15.5)
WBC: 9.1 10*3/uL (ref 4.0–10.5)
nRBC: 0 % (ref 0.0–0.2)

## 2020-08-12 LAB — MRSA PCR SCREENING: MRSA by PCR: NEGATIVE

## 2020-08-12 LAB — RESPIRATORY PANEL BY RT PCR (FLU A&B, COVID)
Influenza A by PCR: NEGATIVE
Influenza B by PCR: NEGATIVE
SARS Coronavirus 2 by RT PCR: NEGATIVE

## 2020-08-12 MED ORDER — ROFLUMILAST 500 MCG PO TABS
500.0000 ug | ORAL_TABLET | Freq: Every day | ORAL | Status: DC
  Administered 2020-08-13 – 2020-08-16 (×4): 500 ug via ORAL
  Filled 2020-08-12 (×5): qty 1

## 2020-08-12 MED ORDER — FENTANYL CITRATE (PF) 100 MCG/2ML IJ SOLN
50.0000 ug | Freq: Once | INTRAMUSCULAR | Status: AC
  Administered 2020-08-12: 50 ug via INTRAVENOUS
  Filled 2020-08-12: qty 2

## 2020-08-12 MED ORDER — IOHEXOL 350 MG/ML SOLN
60.0000 mL | Freq: Once | INTRAVENOUS | Status: AC | PRN
  Administered 2020-08-12: 60 mL via INTRAVENOUS

## 2020-08-12 MED ORDER — FERROUS SULFATE 325 (65 FE) MG PO TABS
325.0000 mg | ORAL_TABLET | Freq: Every day | ORAL | Status: DC
  Administered 2020-08-13 – 2020-08-16 (×4): 325 mg via ORAL
  Filled 2020-08-12 (×4): qty 1

## 2020-08-12 MED ORDER — ASPIRIN EC 81 MG PO TBEC
81.0000 mg | DELAYED_RELEASE_TABLET | Freq: Every day | ORAL | Status: DC
  Administered 2020-08-13 – 2020-08-16 (×4): 81 mg via ORAL
  Filled 2020-08-12 (×4): qty 1

## 2020-08-12 MED ORDER — ONDANSETRON HCL 4 MG/2ML IJ SOLN: 4.0000 mg | Freq: Once | INTRAMUSCULAR | Status: AC

## 2020-08-12 MED ORDER — MORPHINE SULFATE (PF) 2 MG/ML IV SOLN
1.0000 mg | INTRAVENOUS | Status: DC | PRN
  Administered 2020-08-12 – 2020-08-13 (×3): 1 mg via INTRAVENOUS
  Filled 2020-08-12 (×3): qty 1

## 2020-08-12 MED ORDER — ALBUTEROL SULFATE HFA 108 (90 BASE) MCG/ACT IN AERS
2.0000 | INHALATION_SPRAY | Freq: Four times a day (QID) | RESPIRATORY_TRACT | Status: DC | PRN
  Filled 2020-08-12: qty 6.7

## 2020-08-12 MED ORDER — ACETAMINOPHEN 325 MG PO TABS
650.0000 mg | ORAL_TABLET | Freq: Four times a day (QID) | ORAL | Status: DC
  Administered 2020-08-12 – 2020-08-16 (×11): 650 mg via ORAL
  Filled 2020-08-12 (×12): qty 2

## 2020-08-12 MED ORDER — METHYLPREDNISOLONE SODIUM SUCC 125 MG IJ SOLR
125.0000 mg | Freq: Once | INTRAMUSCULAR | Status: AC
  Administered 2020-08-12: 125 mg via INTRAVENOUS
  Filled 2020-08-12: qty 2

## 2020-08-12 MED ORDER — ALBUTEROL SULFATE HFA 108 (90 BASE) MCG/ACT IN AERS
4.0000 | INHALATION_SPRAY | Freq: Once | RESPIRATORY_TRACT | Status: AC
  Administered 2020-08-12: 4 via RESPIRATORY_TRACT
  Filled 2020-08-12: qty 6.7

## 2020-08-12 MED ORDER — PANTOPRAZOLE SODIUM 40 MG PO TBEC
40.0000 mg | DELAYED_RELEASE_TABLET | Freq: Every day | ORAL | Status: DC
  Administered 2020-08-13 – 2020-08-16 (×4): 40 mg via ORAL
  Filled 2020-08-12 (×4): qty 1

## 2020-08-12 MED ORDER — ONDANSETRON HCL 4 MG/2ML IJ SOLN
INTRAMUSCULAR | Status: AC
  Administered 2020-08-12: 4 mg via INTRAVENOUS
  Filled 2020-08-12: qty 2

## 2020-08-12 MED ORDER — BUDESON-GLYCOPYRROL-FORMOTEROL 160-9-4.8 MCG/ACT IN AERO: 2.0000 | INHALATION_SPRAY | Freq: Two times a day (BID) | RESPIRATORY_TRACT | Status: DC

## 2020-08-12 MED ORDER — MOMETASONE FURO-FORMOTEROL FUM 100-5 MCG/ACT IN AERO
2.0000 | INHALATION_SPRAY | Freq: Two times a day (BID) | RESPIRATORY_TRACT | Status: DC
  Administered 2020-08-12 – 2020-08-16 (×8): 2 via RESPIRATORY_TRACT
  Filled 2020-08-12: qty 8.8

## 2020-08-12 MED ORDER — CYCLOBENZAPRINE HCL 10 MG PO TABS
5.0000 mg | ORAL_TABLET | Freq: Once | ORAL | Status: AC
  Administered 2020-08-12: 5 mg via ORAL
  Filled 2020-08-12: qty 1

## 2020-08-12 MED ORDER — FERROUS SULFATE 324 (65 FE) MG PO TBEC: 1.0000 | DELAYED_RELEASE_TABLET | Freq: Every day | ORAL | Status: DC

## 2020-08-12 MED ORDER — KETOROLAC TROMETHAMINE 15 MG/ML IJ SOLN
15.0000 mg | Freq: Once | INTRAMUSCULAR | Status: AC
  Administered 2020-08-12: 15 mg via INTRAVENOUS
  Filled 2020-08-12: qty 1

## 2020-08-12 MED ORDER — ONDANSETRON HCL 4 MG/2ML IJ SOLN
4.0000 mg | Freq: Once | INTRAMUSCULAR | Status: AC
  Administered 2020-08-12: 4 mg via INTRAVENOUS

## 2020-08-12 MED ORDER — UMECLIDINIUM BROMIDE 62.5 MCG/INH IN AEPB
1.0000 | INHALATION_SPRAY | Freq: Every day | RESPIRATORY_TRACT | Status: DC
  Administered 2020-08-12 – 2020-08-16 (×4): 1 via RESPIRATORY_TRACT
  Filled 2020-08-12: qty 7

## 2020-08-12 MED ORDER — ENOXAPARIN SODIUM 40 MG/0.4ML ~~LOC~~ SOLN
40.0000 mg | SUBCUTANEOUS | Status: DC
  Administered 2020-08-12 – 2020-08-15 (×4): 40 mg via SUBCUTANEOUS
  Filled 2020-08-12 (×4): qty 0.4

## 2020-08-12 MED ORDER — CYCLOSPORINE 0.05 % OP EMUL
1.0000 [drp] | Freq: Two times a day (BID) | OPHTHALMIC | Status: DC
  Administered 2020-08-12 – 2020-08-16 (×8): 1 [drp] via OPHTHALMIC
  Filled 2020-08-12 (×9): qty 1

## 2020-08-12 NOTE — Progress Notes (Signed)
Orthopedic Tech Progress Note Patient Details:  Mary Jenkins 1956-06-16 [id number] Semi Rigid TLSO brace ordered from [facility] Patient ID: Miki Kins, female   DOB: April 10, 1956, 64 y.o.   MRN: [id number]   Tammy Sours 08/12/2020, 1:48 PM

## 2020-08-12 NOTE — ED Provider Notes (Signed)
Medical Decision Making: Care of patient assumed from Dr. Dayna Barker at 0730.  Agree with history, physical exam and plan.  See their note for further details.  Briefly, The pt p/w eye swelling, history of COPD, got breathing treatment, also had chest pain back pain.  CT PE study ordered.  Ultrasound of the thigh ordered.   Current plan is as follows: Imaging and [place]  Ultrasound negative for DVT.  PE scan negative for PE.  Patient still having significant back pain, additional medications given but unsuccessful in treating pain.  Imaging is obtained and CT scan shows compression fracture of the T11.  I consulted neurosurgery for recommendations, due to her breathing difficulties in the setting of COPD they recommend a semirigid TLSO brace.  I had multiple conversations with his family and they are reluctant to get admitted or to go to a skilled nursing facility/rehabilitation facility.  They do agree to admission for further consultation with primary care specialist team as well as physical therapy for further recommendations.  Patient agrees with this plan status family.  They are admitted to the family medicine service.  I personally reviewed and interpreted all labs/imaging.      Breck Coons, MD 08/13/20 [phone]

## 2020-08-12 NOTE — Progress Notes (Signed)
Pt arrived to the unit as transfer from the ED. Patient pleasant but having back pain of 8 out of 10. Patient pulled over the bed from stretcher. VS stable. Possessions at the bed side are gown, cell phone, phone charger, gold necklace and gold bracelet.   No pain meds on MAR for Pt's back pain. FMTS paged waiting on response.   Bed in lowest position, call bell in reach.

## 2020-08-12 NOTE — H&P (Addendum)
[facility] Admission History and Physical Service Pager: [phone]  Patient name: Mary Jenkins Medical record number: [id number] Date of birth: 10/16/1955 Age: 64 y.o. Gender: female  Primary Care Provider: Matilde Haymaker, MD Consultants: None Code Status: Full  Preferred Emergency Contact: Daughter  Chief Complaint: Thigh pain and back pain  Assessment and Plan: Angelo Caroll is a 64 y.o. female presenting with Thigh pain and back pain. PMH is significant for COPD GOLD IV, Anemia, MDD, GERD, H/O breast Cancer, Cervical spine arthritis and Cataract.  Severe Back pain Patient has history of chronic back pain. Patient states this morning she was sitting on wheelchair for 4 hours and she felt extreme pain in her back when she got up for bathroom. Patient states she could not stand after that. She denies any H/O trauma. Denies any tingling in her lower extremities. She denies any weakness in her legs, bowel/bladder incontinence, saddle anesthesia. Neuro examination normal except for mildly decreased sensation on left lower extremity, but normal motor. CT [place] spine showed osteopenia with T11 superior endplate compression fracture suspected to be acute or subacute with No retropulsion or complicating features. NeuroSurgery consulted and does not recommend surgery at this time. Recommendations includes TLSO brace pain control, rehab evaluation and follow-up in 2 to 3 weeks. EDP consulted for admission for pain control and physical therapy evaluation for SNF placement. -Admit to FPTS. UNIT - inpatient in anticipation for SNF placement, Attending Dr. Erin Hearing -Neurosurgery Consulted. Appreciate recommendations. -Tylenol Q6H for pain. -1 mg IV morphine q 4 hours PRN  -Up with assistance. -Continue TLSO brace. -PT/OT eval and management. -Rehab evaluation and placement.  -F/U with NS 2-3 weeks after D/C  Thigh pain, resolved Pt presented to the ED with  L thigh pain x2 weeks . Pt denies swelling, reports that it was warm and pain to touch. Femur xray without acute findings. DVT is ruled out by negative Lower extremity ultrasound. PE ruled out by negative CTA Chest. On admission evaluation, patient reports that her pain has been resolved and now she has been having pain only in the back.  -Monitor Vitals according to Floor. -Up with Assistance. -Tylenol Q6H for Pain   COPD GOLD IV Pt has H/o COPD GOLD IV. Followed by pulmonology outpatient. Patient's daughter reports that pt has had difficulty just moving around the house due to dyspnea. No acute worsening, but patient reports she cannot take care of herself and needs assistance in rehab place for few weeks. Per pulmonary outpatient notes, patient with COPD action plan. Recently started steroid burst 3 days ago. Home medications include: albuterol, breztri 2 puffs BID, daliresp 500 mg daily. Pt on 3L of Home O2.  Receives PT twice a week. Pt is a former smoker.  -Continue O2 at 3L. -Maintain O2 saturation 88-92% -Continue albuterol inhaler and nebulization solution. -Continue breztri.  -PT/OT eval and treat.   Anemia, normocytic  Pt's Hb 11.3, MCV- 86.1, Chronically low Hb.  Likely anemia of chronic disease. Her last Iron studies was 05/01/20 with Normal Iron of 76 and TIBC of 318. Pt is on ferrous sulfate 325 mg daily. -Continue iron 325 mg daily.  GERD (gastroesophageal reflux disease) Patient takes Protonix 40 mg daily. -Continue Protonix  H/o Breast cancer Patient has history of right-sided breast cancer 23 years ago. She underwent a unilateral mastectomy with breast reconstruction and 6 months of chemotherapy.  She no longer sees an oncologist  FEN/GI: Normal diet Prophylaxis: Enoxaparin  Disposition: Med Surg. Anticipate  discharge to SNF.   History of Present Illness:  Mary Jenkins is a 64 y.o. female presenting with thigh pain and back pain. Pt states her thigh pain started  2 weeks ago, denies swelling, reports that it was warm and pain to touch. Pt states it has resolved now. Pt now c/o back pain. Patient states this morning she was sitting on wheelchair for 4 hours and she felt extreme pain in her back when she got up for bathroom. Patient states she could not stand. No H/O trauma. Denies any tingling in her lower extremities. She denies any weakness in her legs.  Denies any headache, chest pain, shortness of breath, diarrhea, nausea, dizziness and abdominal pain.  Patient states she cannot take care of herself and needs assistance in rehab place for few weeks.  Review Of Systems: Per HPI with the following additions:   Review of Systems  Constitutional: Negative for chills and fever.  Respiratory: Negative for cough.   Cardiovascular: Negative for chest pain.  Gastrointestinal: Negative for abdominal pain, diarrhea, nausea and vomiting.  Musculoskeletal: Positive for back pain.  Neurological: Negative for dizziness and headaches.    Patient Active Problem List   Diagnosis Date Noted  . Compression fracture of body of thoracic vertebra ([place]) 08/12/2020  . Dysphagia 04/23/2020  . Acute respiratory distress 04/22/2020  . Chills (without fever) 02/23/2020  . Shortness of breath 12/27/2019  . Screening for hyperlipidemia 12/03/2019  . Bruising 07/21/2019  . Healthcare maintenance 05/23/2018  . Chronic respiratory failure with hypoxia and hypercapnia ([place]) 04/13/2018  . Weight loss 08/02/2017  . Severe episode of recurrent major depressive disorder, without psychotic features ([place]) 04/25/2017  . Encounter for HCV screening test for low risk patient 03/10/2017  . History of hyperkalemia 02/13/2016  . Skin lesion of back 11/15/2014  . Atypical chest pain 10/21/2014  . Cataract 10/21/2014  . Dyslipidemia, goal to be determined 04/22/2014  . Lipoma 10/07/2012  . Cervical spine arthritis 08/27/2012  . History of breast cancer 07/23/2012  . GERD  (gastroesophageal reflux disease) 02/03/2012  . Anemia 07/29/2011  . COPD GOLD IV 06/24/2011  . Liver cyst 06/24/2011  . Insomnia 06/24/2011    Past Medical History: Past Medical History:  Diagnosis Date  . Breast cancer ([place])   . Cataract, immature    bilateral  . COPD (chronic obstructive pulmonary disease) ([place])    no home O2  . Cough 02/09/2015  . DDD (degenerative disc disease), cervical   . Exertional shortness of breath    states if she "takes her time" doing activities does not get SOB  . Family history of adverse reaction to anesthesia    pt's sister has hx. of post-op N/V  . Full dentures   . GERD (gastroesophageal reflux disease) 02/03/2012  . High cholesterol    no current med.  Marland Kitchen History of breast cancer   . Personal history of chemotherapy 1997  . Runny nose 02/09/2015   clear drainage, per pt.    Past Surgical History: Past Surgical History:  Procedure Laterality Date  . BREAST CAPSULOTOMY WITH IMPLANT EXCHANGE Right 06/21/2013   Procedure: REVISION RIGHT BREAST RECONSTRUCTION/REMOVAL OF RIGHT IMPLANT/RIGHT BREAST CAPSULOTOMY WITH INSERT TISSUE EXPLANDER RIGHT BREAST/POSSIBLE LEFT BREAST MASTOPEXY;  Surgeon: Cristine Polio, MD;  Location: [place];  Service: Plastics;  Laterality: Right;  . BREAST RECONSTRUCTION Right 06/21/2013   Procedure: BREAST RECONSTRUCTION;  Surgeon: Cristine Polio, MD;  Location: [place];  Service: Plastics;  Laterality: Right;  .  BREAST RECONSTRUCTION Left 11/29/2013   Procedure: LEFT MASTOPEXY FOR RECONSTRUCTION;  Surgeon: Cristine Polio, MD;  Location: [place];  Service: Plastics;  Laterality: Left;  . BREAST RECONSTRUCTION Right 03/14/2014   Procedure: RECONSTRUCTION NIPPLE RIGHT BREAST;  Surgeon: Cristine Polio, MD;  Location: [place];  Service: Plastics;  Laterality: Right;  . BREAST SURGERY  97   implant  . CAPSULOTOMY Right 06/30/2017   Procedure:  CAPSULOTOMY;  Surgeon: Cristine Polio, MD;  Location: [place];  Service: Plastics;  Laterality: Right;  . COLONOSCOPY    . ESOPHAGOGASTRODUODENOSCOPY (EGD) WITH PROPOFOL N/A 04/25/2020   Procedure: ESOPHAGOGASTRODUODENOSCOPY (EGD) WITH PROPOFOL;  Surgeon: Milus Banister, MD;  Location: [facility];  Service: Endoscopy;  Laterality: N/A;  . MASTECTOMY, PARTIAL Right 1997   -node dissection  . REDUCTION MAMMAPLASTY Left   . REMOVAL OF TISSUE EXPANDER AND PLACEMENT OF IMPLANT Right 02/13/2015   Procedure: REMOVAL OF TISSUE  EXPANDER  PORT RIGHT BREAST;  Surgeon: Cristine Polio, MD;  Location: [place];  Service: Plastics;  Laterality: Right;  . REMOVAL OF TISSUE EXPANDER AND PLACEMENT OF IMPLANT Right 06/30/2017   Procedure: REMOVAL OF RIGHT TISSUE EXPANDER WITH PLACEMENT OF RIGHT GELL BREAST IMPLANTS;  Surgeon: Cristine Polio, MD;  Location: [place];  Service: Plastics;  Laterality: Right;  . SCAR REVISION Right 02/13/2015   Procedure: SCAR REVISION RIGHT BREAST;  Surgeon: Cristine Polio, MD;  Location: [place];  Service: Plastics;  Laterality: Right;  . TISSUE EXPANDER PLACEMENT Right 03/14/2014   Procedure: SALINE REMOVAL RIGHT TISSUE EXPANDER;  Surgeon: Cristine Polio, MD;  Location: [place];  Service: Plastics;  Laterality: Right;  . TUBAL LIGATION  1980's    Social History: Social History   Tobacco Use  . Smoking status: Former Smoker    Quit date: 07/23/2012    Years since quitting: 8.0  . Smokeless tobacco: Never Used  Vaping Use  . Vaping Use: Never used  Substance Use Topics  . Alcohol use: No    Alcohol/week: 0.0 standard drinks    Comment: one beer every now and then  . Drug use: No   Additional social history: Former Smoker Please also refer to relevant sections of EMR.  Family History: Family History  Problem Relation Age of Onset  . Stroke Mother   . Heart disease  Brother   . Heart disease Brother   . Anesthesia problems Sister        post-op N/V  . Cancer Sister        Endometrial CA  . Heart disease Brother   . Tuberculosis Daughter        not active  . Allergic rhinitis Daughter   . Allergic rhinitis Son   . Hypertension Sister   . Diabetes Sister   . Breast cancer Neg Hx      Allergies and Medications: No Known Allergies No current facility-administered medications on file prior to encounter.   Current Outpatient Medications on File Prior to Encounter  Medication Sig Dispense Refill  . albuterol (PROAIR HFA) 108 (90 Base) MCG/ACT inhaler Inhale 2 puffs into the lungs every 6 (six) hours as needed for wheezing or shortness of breath. 18 g 3  . albuterol (PROVENTIL) (2.5 MG/3ML) 0.083% nebulizer solution Take 3 mLs (2.5 mg total) by nebulization every 6 (six) hours as needed for wheezing or shortness of breath. 75 mL 6  . aspirin EC 81 MG tablet Take 81  mg by mouth daily. Swallow whole.    . Budeson-Glycopyrrol-Formoterol (BREZTRI AEROSPHERE) 160-9-4.8 MCG/ACT AERO Inhale 2 puffs into the lungs 2 (two) times daily. 10.7 g 0  . cycloSPORINE (RESTASIS) 0.05 % ophthalmic emulsion Place 1 drop into both eyes 2 (two) times daily.     . ferrous sulfate 324 (65 Fe) MG TBEC Take 1 tablet (325 mg total) by mouth daily. OK to take every other day if it is constipating. 60 tablet 1  . OXYGEN Place 3 L into the nose continuous.     . pantoprazole (PROTONIX) 40 MG tablet Take 1 tablet (40 mg total) by mouth daily. 30 tablet 6  . roflumilast (DALIRESP) 500 MCG TABS tablet Take 1 tablet (500 mcg total) by mouth daily. 30 tablet 11  . valACYclovir (VALTREX) 1000 MG tablet TAKE 1 TABLET BY MOUTH TWICE DAILY FOR 3 DAYS AS NEEDED (Patient taking differently: Take 1,000 mg by mouth 2 (two) times daily as needed (for flare up). ) 30 tablet 5  . famotidine (PEPCID) 20 MG tablet Take 1 tablet (20 mg total) by mouth at bedtime. (Patient not taking: Reported on  08/12/2020) 30 tablet 5  . mirtazapine (REMERON) 15 MG tablet TAKE 1 TABLET BY MOUTH AT BEDTIME (Patient not taking: Reported on 08/12/2020) 60 tablet 0  . predniSONE (DELTASONE) 10 MG tablet Take  4 each am x 2 days,   2 each am x 2 days,  1 each am x 2 days and stop (Patient not taking: Reported on 08/12/2020) 14 tablet 0    Objective: BP 127/82 (BP Location: Left Arm)   Pulse 74   Temp 98.1 F (36.7 C) (Oral)   Resp 18   SpO2 100%  Exam: General: Not in acute distress.  breathing comfortably at 3 L oxygen via nasal canula. Oriented x4 Neck: Trachea central, no swelling. Cardiovascular: S1, S2 normal, no murmurs rubs and gallops Respiratory: Breath Sounds equal clear, Chest brace in place. Gastrointestinal: No swelling, Soft, nontender MSK: No edema, No swelling and redness in thighs. Derm: soft, warm Neuro: CN 2-12- intact.  Strength bilateral extremities equal. Sensation mildly decreased in left leg but normal in Thighs.  Psych: Mood and affect- Normal  Labs and Imaging: CBC BMET  Recent Labs  Lab 08/12/20 0427  WBC 9.1  HGB 11.3*  HCT 38.5  PLT 320   Recent Labs  Lab 08/12/20 0427  NA 142  K 3.6  CL 101  CO2 28  BUN 15  CREATININE 0.88  GLUCOSE 89  CALCIUM 9.2     EKG: Sinus rhythm Right atrial enlargement Borderline right axis deviation Nonspecific T abnormalities, lateral lead Qtc- [place], MD 08/12/2020, 2:41 PM PGY-1, [place] Intern pager: [phone], text pages welcome  FPTS Upper-Level Resident Addendum I have independently interviewed and examined the patient. I have discussed the above with the original author and agree with their documentation. My edits for correction/addition/clarification are in - green. Please see also any attending notes.  [place] Service pager: [phone] (text pages welcome through [facility])  Wilber Oliphant, M.D.  PGY-3 08/12/2020 8:10 PM

## 2020-08-12 NOTE — Progress Notes (Addendum)
Family Medicine Teaching Service Daily Progress Note Intern Pager: [phone]  Patient name: Mary Jenkins Medical record number: [id number] Date of birth: Jun 23, 1956 Age: 64 y.o. Gender: female  Primary Care Provider: Matilde Haymaker, MD Consultants: Neurosurgery Code Status: Full code  Pt Overview and Major Events to Date:  08/12/2020: Patient admitted with severe thigh pain, found to have T11 compression fracture, placed in brace by neurosurgery, recommended for discharge to SNF,   Assessment and Plan: Mary Jenkins is a 64 y.o. female  who presented with thigh pain and back pain. PMH is significant for COPD GOLD IV, Anemia, MDD, GERD, H/O breast Cancer, Cervical spine arthritis and Cataract.  T11 compression fracture in setting of osteopenia Patient received morphine 1-2 mg every 6 hours overnight.  This morning patient reports her pain was persistent overnight and not improved via morphine.  She states that pain remain 10/10 with the morphine overnight.  She states that the fentanyl could pain from 10 to a 5 out of 10. -DC morphine - fentanyl 50 mcg every 2 hours as needed for breakthrough -start oxycodone 54m every 4 hours - continue Tylenol q 6 hours  -Neurosurgery recommended TLSO brace with outpatient follow-up in 2-3 weeks following discharge, signed off - PT/OT recommending SNF placement at time of discharge  COPD, GOLD IV Home medications include: albuterol, breztri 2 puffs BID, daliresp 500 mg daily. Pt on 3L of Home O2. Overnight, patient remained on 3-4 liters of supplemental oxygen. On exam this morning, patient is breathing without signs of respiratory distress on supplemental oxygen via nasal cannula.  - continue daliresp, albuterol and dulera with incruse ellipta   Anemia, normocytic   Hb on admission 11.3, MCV 86.1, Chronically low Hb ranging from. Pt is on ferrous sulfate 325 mg daily for home medication. -Continue iron 325 mg daily.  GERD  Continue  Protonix 459mdaily    FEN/GI: regular diet   PPx: Lovenox    Status is: Inpatient  Remains inpatient appropriate because:Ongoing active pain requiring inpatient pain management   Dispo: The patient is from: Home              Anticipated d/c is to: SNF              Anticipated d/c date is: 2 days              Patient currently is not medically stable to d/c.  Subjective:  Patient reports that her pain is is persistent and was not improved with morphine.  Patient states that fentanyl worked better and decreased her pain by half.  She denies any weakness in her lower extremities.  States that the pain in her thighs is resolved.  She denies any worsened shortness of breath.  Objective: Temp:  [97.9 F (36.6 C)-99 F (37.2 C)] 98.4 F (36.9 C) (11/21 0519) Pulse Rate:  [65-95] 73 (11/21 0519) Resp:  [7-30] 16 (11/21 0519) BP: (113-150)/(73-129) 136/93 (11/21 0519) SpO2:  [98 %-100 %] 100 % (11/21 0519) FiO2 (%):  [4 %] 4 % (11/20 2307) Weight:  [49.9 kg] 49.9 kg (11/20 1714)  Physical Exam: General: Female appearing stated age, lying calmly in bed, lying still and attempting to limit movement Cardiovascular: Regular rate and rhythm Respiratory: Decreased breath sounds bilaterally Extremities: Moves extremities with normal range of motion Neuro: Patient with normal sensation in bilateral lower extremities, 4/5 strength in bilateral lower extremities likely limited secondary to pain, patient demonstrates normal range of motion of bilateral upper extremities,  patient alert and oriented x3  Laboratory: Recent Labs  Lab 08/12/20 0427  WBC 9.1  HGB 11.3*  HCT 38.5  PLT 320   Recent Labs  Lab 08/12/20 0427  NA 142  K 3.6  CL 101  CO2 28  BUN 15  CREATININE 0.88  CALCIUM 9.2  PROT 6.9  BILITOT 0.5  ALKPHOS 51  ALT 14  AST 23  GLUCOSE 89    CT Angio Chest PE W and/or Wo Contrast  Result Date: 08/12/2020 CLINICAL DATA:  Increased shortness of breath this  morning EXAM: CT ANGIOGRAPHY CHEST WITH CONTRAST TECHNIQUE: Multidetector CT imaging of the chest was performed using the standard protocol during bolus administration of intravenous contrast. Multiplanar CT image reconstructions and MIPs were obtained to evaluate the vascular anatomy. CONTRAST:  103m OMNIPAQUE IOHEXOL 350 MG/ML SOLN COMPARISON:  12/27/2019 FINDINGS: Cardiovascular: Satisfactory opacification of the pulmonary arteries to the segmental level. No evidence of pulmonary embolism. Normal heart size. No pericardial effusion. Mediastinum/Nodes: Negative for adenopathy or mass Lungs/Pleura: Advanced centrilobular/panlobular emphysema. 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 There is no edema, consolidation, effusion, or pneumothorax. Calcified pulmonary nodules at the left lung base. No worrisome nodule Upper Abdomen: No acute finding Musculoskeletal: No acute or aggressive finding. Other: Right mastectomy and axillary dissection. Right breast reconstruction. Review of the MIP images confirms the above findings. IMPRESSION: 1. Negative for pulmonary embolism or other acute finding. 2. Advanced COPD Aortic Atherosclerosis (ICD10-I70.0). Electronically Signed   By: JMonte FantasiaM.D.   On: 08/12/2020 08:25   CT Lumbar Spine Wo Contrast  Result Date: 08/12/2020 CLINICAL DATA:  64year old female with 2 weeks of low back pain. No known injury. Remote history of breast cancer. EXAM: CT LUMBAR SPINE WITHOUT CONTRAST TECHNIQUE: Multidetector CT imaging of the lumbar spine was performed without intravenous contrast administration. Multiplanar CT image reconstructions were also generated. COMPARISON:  CTA chest today reported separately. Chest CTA 12/27/2019. CT Abdomen and Pelvis 12/08/2017. FINDINGS: Segmentation: Normal. Alignment: Stable straightening of lumbar lordosis since 2019. Vertebrae: Osteopenia. Lumbar levels appear intact, with stable lumbar vertebral height compared to  2019. Visible sacrum and SI joints appear intact. Mostly included T11 vertebral level is remarkable for a superior endplate deformity with up to 30% loss of vertebral body height which is much more conspicuous than on the CTA earlier today (series 7, image 34). The superior endplate fracture appears mildly comminuted an un healed (series 4, image 2). But with no retropulsion or complicating features. T11 posterior elements appear intact. Paraspinal and other soft tissues: Lower chest is stable from that reported earlier today. Visible abdominal viscera appear stable since 2019, with symmetric excretion of IV contrast from the kidneys to nondilated ureters. Aortoiliac calcified atherosclerosis. Negative lower thoracic and lumbar paraspinal soft tissues. Disc levels: Lumbar spine degeneration appears generally mild for age and not significantly changed from 2019. Disc bulging is most pronounced at L4 where trace vacuum disc is apparent. There is up to mild spinal stenosis at L2-L3 and L4-L5. IMPRESSION: 1. Osteopenia with T11 superior endplate compression fracture suspected to be acute or subacute. 30% loss of vertebral body height No retropulsion or complicating features. If specific therapy such as vertebral augmentation is desired, Lumbar MRI without contrast or Nuclear Medicine Whole-body Bone Scan would confirm acuity and candidacy for vertebroplasty. 2. No acute osseous abnormality in the lumbar spine, and lumbar spine degeneration appears stable since 2019. 3. Aortic Atherosclerosis (ICD10-I70.0) and Emphysema (ICD10-J43.9). Electronically Signed   By: HGenevie Ann  M.D.   On: 08/12/2020 12:52   VAS [place] LOWER EXTREMITY VENOUS (DVT) (ONLY [facility] & [facility])  Result Date: 08/12/2020  Lower Venous DVT Study Indications: Edema.  Performing Technologist: [name] RVS  Examination Guidelines: A complete evaluation includes B-mode imaging, spectral Doppler, color Doppler, and power Doppler as needed of all accessible portions  of each vessel. Bilateral testing is considered an integral part of a complete examination. Limited examinations for reoccurring indications may be performed as noted. The reflux portion of the exam is performed with the patient in reverse Trendelenburg.  +-----+---------------+---------+-----------+----------+--------------+ RIGHTCompressibilityPhasicitySpontaneityPropertiesThrombus Aging +-----+---------------+---------+-----------+----------+--------------+ CFV  Full           Yes      Yes                                 +-----+---------------+---------+-----------+----------+--------------+   +---------+---------------+---------+-----------+----------+--------------+ LEFT     CompressibilityPhasicitySpontaneityPropertiesThrombus Aging +---------+---------------+---------+-----------+----------+--------------+ CFV      Full           Yes      Yes                                 +---------+---------------+---------+-----------+----------+--------------+ SFJ      Full                                                        +---------+---------------+---------+-----------+----------+--------------+ FV Prox  Full                                                        +---------+---------------+---------+-----------+----------+--------------+ FV Mid   Full                                                        +---------+---------------+---------+-----------+----------+--------------+ FV DistalFull                                                        +---------+---------------+---------+-----------+----------+--------------+ PFV      Full                                                        +---------+---------------+---------+-----------+----------+--------------+ POP      Full           Yes      Yes                                 +---------+---------------+---------+-----------+----------+--------------+ PTV      Full                                                         +---------+---------------+---------+-----------+----------+--------------+  PERO     Full                                                        +---------+---------------+---------+-----------+----------+--------------+     Summary: RIGHT: - No evidence of deep vein thrombosis in the lower extremity. No indirect evidence of obstruction proximal to the inguinal ligament.  LEFT: - There is no evidence of deep vein thrombosis in the lower extremity.  - No cystic structure found in the popliteal fossa.  *See table(s) above for measurements and observations. Electronically signed by Deitra Mayo MD on 08/12/2020 at 7:57:22 PM.    Final     Eulis Foster, MD 08/13/2020, 6:27 AM PGY-2, [place] Intern pager: [phone], text pages welcome

## 2020-08-12 NOTE — ED Notes (Signed)
Patient transported to CT

## 2020-08-12 NOTE — Progress Notes (Signed)
Lower extremity venous has been completed.   Preliminary results in CV Proc.   [name] 08/12/2020 10:22 AM

## 2020-08-12 NOTE — ED Notes (Signed)
Pt is in radiology at this time.

## 2020-08-12 NOTE — ED Notes (Signed)
Pt was having difficulty breathing when nurse entered room. Breathing was labored. C/O back pain and pain with respirations. Dr. Dayna Barker notified.

## 2020-08-12 NOTE — Consult Note (Signed)
Consultation Note Date: 08/12/2020   Patient Name: Mary Jenkins  DOB: [birth year]  MRN: [id number]  Age / Sex: 64 y.o., female  PCP: Matilde Haymaker, MD Referring Physician: Lind Covert, MD  Reason for Consultation: Establishing goals of care  HPI/Patient Profile: 64 y.o. female  with past medical history of advanced COPD (GOLD IV), breast cancer (1997, s/p right mastectomy), and anemia. She presented to the emergency department on 08/11/2020 with left thigh pain. She is unable to ambulate.  ED Course: CT lumbar spine shows T11 compression fracture. Patient will be admitted for PT, pain management, palliative, and neuro consults.   Clinical Assessment and Goals of Care: I have reviewed medical records including EPIC notes, labs and imaging, and met at bedside with patient  to discuss diagnosis, prognosis, GOC, EOL wishes, disposition, and options.  I introduced Palliative Medicine as specialized medical care for people living with serious illness. It focuses on providing relief from the symptoms and stress of a serious illness.   We discussed a brief life review of the patient. She has 2 children - Quillian Quince and [place]ue. She was divorced and raised them as a single mother. 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   As far as functional status, she reports dyspnea and fatigue with even minimal exertion. She appears dyspneic even with talking. She reports difficulty ambulating.   We discussed her current illness and what it means in the larger context of her ongoing co-morbidities.  Natural disease trajectory of COPD was discussed. Patient understands she has a terminal condition. She is also very aware that her disease is advanced.   I attempted to elicit values and goals of care important to the patient. She  states she wants to "not be in pain".  She is willing to go to SNF for rehab for short-term with the goal of regaining strength enough to go home.    The difference between aggressive medical intervention and comfort care was considered in light of the patient's goals of care.  We did discuss code status. Encouraged patient to consider DNR/DNI status understanding evidenced based poor outcomes in similar hospitalized patients, as the cause of the arrest is likely associated with chronic/terminal disease rather than a reversible acute cardio-pulmonary event. She agrees that DNR is appropriate however she states her daughter will not agree. I gently reassure patient that this is her decision, not her daughter's. Patient shares that her daughter became very upset when she previously made herself a DNR, and she does not want to argue about it with her again.   I offered to speak with her daughter about code status. Patient states her daughter is "hot-headed" and I (this NP) will not be able to change her mind. She urges me not to push the issue so as not to anger and upset her daughter. I inquire as to if her daughter is perhaps in denial that her condition is  terminal - patient agrees that she probably is.   Hospice and Palliative Care services outpatient were explained and offered. Patient states she is not ready for hospice at this time and that I (this NP) was speaking very negatively. I gently remind patient that she herself stated her condition is terminal and that her goal was to "not be in pain", and that this is very consistent with the hospice philosophy.   Questions and concerns were addressed.  The patient was encouraged to call with questions or concerns.    Primary decision maker: Patient can make her own medical decisions, but will not make any decisions that oppose her daughter.     SUMMARY OF RECOMMENDATIONS   - full code, full scope treatment   - patient herself does not want CPR or  intubation but is not willing to agree to DNR status so as not to upset and/or anger her daughter - patient is willing to go to SNF for rehab for short-term with the goal of regaining strength enough to go home. - patient verbalizes that her condition is terminal and she wants to "not be in pain", but she is not interested in hospice at this time.  - recommend outpatient palliative at discharge - PMT will continue to follow  Code Status/Advance Care Planning:  Full code   Symptom Management:   Per primary team  Morphine 1 mg IV every 4 hours pen severe pain  Palliative Prophylaxis:   Frequent Pain Assessment  Additional Recommendations (Limitations, Scope, Preferences):  Full Scope Treatment  Psycho-social/Spiritual:   Created space and opportunity for patient to express thoughts and feelings regarding patient's current medical situation.   Emotional support provided   Prognosis:   < 6 months would not be surprising  Discharge Planning: To Be Determined      Primary Diagnoses: Present on Admission: . Compression fracture of body of thoracic vertebra (HCC) . Back pain   I have reviewed the medical record, interviewed the patient and family, and examined the patient. The following aspects are pertinent.  Past Medical History:  Diagnosis Date  . Breast cancer ([place])   . Cataract, immature    bilateral  . COPD (chronic obstructive pulmonary disease) ([place])    no home O2  . Cough 02/09/2015  . DDD (degenerative disc disease), cervical   . Exertional shortness of breath    states if she "takes her time" doing activities does not get SOB  . Family history of adverse reaction to anesthesia    pt's sister has hx. of post-op N/V  . Full dentures   . GERD (gastroesophageal reflux disease) 02/03/2012  . High cholesterol    no current med.  Marland Kitchen History of breast cancer   . Personal history of chemotherapy 1997  . Runny nose 02/09/2015   clear drainage, per pt.       Family History  Problem Relation Age of Onset  . Stroke Mother   . Heart disease Brother   . Heart disease Brother   . Anesthesia problems Sister        post-op N/V  . Cancer Sister        Endometrial CA  . Heart disease Brother   . Tuberculosis Daughter        not active  . Allergic rhinitis Daughter   . Allergic rhinitis Son   . Hypertension Sister   . Diabetes Sister   . Breast cancer Neg Hx    Scheduled Meds: . acetaminophen  650  mg Oral Q6H  . aspirin EC  81 mg Oral Daily  . Budeson-Glycopyrrol-Formoterol  2 puff Inhalation BID  . cycloSPORINE  1 drop Both Eyes BID  . enoxaparin (LOVENOX) injection  40 mg Subcutaneous Q24H  . ferrous sulfate  1 tablet Oral Daily  . pantoprazole  40 mg Oral Daily  . roflumilast  500 mcg Oral Daily   Continuous Infusions: PRN Meds:.albuterol Medications Prior to Admission:  Prior to Admission medications   Medication Sig Start Date End Date Taking? Authorizing Provider  albuterol (PROAIR HFA) 108 (90 Base) MCG/ACT inhaler Inhale 2 puffs into the lungs every 6 (six) hours as needed for wheezing or shortness of breath. 03/06/20  Yes Matilde Haymaker, MD  albuterol (PROVENTIL) (2.5 MG/3ML) 0.083% nebulizer solution Take 3 mLs (2.5 mg total) by nebulization every 6 (six) hours as needed for wheezing or shortness of breath. 05/03/20  Yes Martyn Ehrich, NP  aspirin EC 81 MG tablet Take 81 mg by mouth daily. Swallow whole.   Yes [provider]  Budeson-Glycopyrrol-Formoterol (BREZTRI AEROSPHERE) 160-9-4.8 MCG/ACT AERO Inhale 2 puffs into the lungs 2 (two) times daily. 12/28/19  Yes Mullis, Kiersten P, DO  cycloSPORINE (RESTASIS) 0.05 % ophthalmic emulsion Place 1 drop into both eyes 2 (two) times daily.    Yes [provider]  ferrous sulfate 324 (65 Fe) MG TBEC Take 1 tablet (325 mg total) by mouth daily. OK to take every other day if it is constipating. 05/15/20  Yes Matilde Haymaker, MD  OXYGEN Place 3 L into the nose  continuous.    Yes [provider]  pantoprazole (PROTONIX) 40 MG tablet Take 1 tablet (40 mg total) by mouth daily. 05/03/20  Yes Martyn Ehrich, NP  roflumilast (DALIRESP) 500 MCG TABS tablet Take 1 tablet (500 mcg total) by mouth daily. 05/17/20  Yes Martyn Ehrich, NP  valACYclovir (VALTREX) 1000 MG tablet TAKE 1 TABLET BY MOUTH TWICE DAILY FOR 3 DAYS AS NEEDED Patient taking differently: Take 1,000 mg by mouth 2 (two) times daily as needed (for flare up).  05/24/19  Yes Shelly Bombard, MD   No Known Allergies Review of Systems  Respiratory: Positive for shortness of breath.   Musculoskeletal: Positive for back pain.    Physical Exam Constitutional:      General: She is not in acute distress. Cardiovascular:     Rate and Rhythm: Normal rate and regular rhythm.  Pulmonary:     Effort: Accessory muscle usage present.     Comments: Appears dyspneic with talking Neurological:     Mental Status: She is alert and oriented to person, place, and time.     Vital Signs: BP 124/79   Pulse 81   Temp 98.1 F (36.7 C) (Oral)   Resp 10   SpO2 100%  Pain Scale: 0-10   Pain Score: 6    SpO2: SpO2: 100 % O2 Device:SpO2: 100 % O2 Flow Rate: .O2 Flow Rate (L/min): 3 L/min  IO: Intake/output summary: No intake or output data in the 24 hours ending 08/12/20 1655   Palliative Assessment/Data: PPS 40%     Time In: 16:00 Time Out: 16:55 Time Total: 55 minutes Greater than 50%  of this time was spent counseling and coordinating care related to the above assessment and plan.  Signed by: Lavena Bullion, NP   Please contact Palliative Medicine Team phone at [phone] for questions and concerns.  For individual provider: See Shea Evans

## 2020-08-12 NOTE — TOC Initial Note (Addendum)
Transition of Care [facility]) - Initial/Assessment Note    Patient Details  Name: Mary Jenkins MRN: [id number] Date of Birth: 1956-06-02  Transition of [facility]) CM/SW Contact:    Verdell Carmine, RN Phone Number: 08/12/2020, 2:12 PM  Clinical Narrative:                 Saw patient in ED has chronic COPD on oxygen at 3LPM at home. Lives in apartment with daughter . Presents with thigh pain, has compression fracture  Cannot ambulate,  Wants to go home, but daughter is having a difficult time caring for her with no support had advanced home health previously. Suggested palliative come and speak with them regarding [place]. Spoke about SNF being a bridge to home. Not a permanent thing. Patient could not tolerate CIR, she has to stop for 20 minutes to rest for breathing after walking to her car to go to a doctor appointment. ESCOPD. Will be admitted for PT consult , palliative , pain management and neuro consult. Patient currently is refusing SNF because of COVID, she is vaccinated however knows if she gets COVID it would be all over" because of COPD. She and daughter will speak more about placement. Agreed to palliative consult. discussed what home health could offer and the need for respite care with other family for daughter when she returns home.  Expected Discharge Plan: Skilled Nursing Facility Barriers to Discharge: Continued Medical Work up   Patient Goals and CMS Choice        Expected Discharge Plan and Services Expected Discharge Plan: [place] In-house Referral: Clinical Social Work Discharge Planning Services: CM Consult Post Acute Care Choice: Durable Medical Equipment (has walker at home) Living arrangements for the past 2 months: Apartment                                      Prior Living Arrangements/Services Living arrangements for the past 2 months: Apartment Lives with:: Adult Children Patient language and need for interpreter reviewed::  Yes Do you feel safe going back to the place where you live?: Yes      Need for Family Participation in Patient Care: Yes (Comment) Care giver support system in place?: Yes (comment)   Criminal Activity/Legal Involvement Pertinent to Current Situation/Hospitalization: No - Comment as needed  Activities of Daily Living      Permission Sought/Granted      Share Information with NAME: Daughter           Emotional Assessment   Attitude/Demeanor/Rapport: Engaged Affect (typically observed): Frustrated Orientation: : Oriented to Self, Oriented to Place, Oriented to  Time, Oriented to Situation Alcohol / Substance Use: Not Applicable Psych Involvement: No (comment)  Admission diagnosis:  thigh pain Patient Active Problem List   Diagnosis Date Noted  . Dysphagia 04/23/2020  . Acute respiratory distress 04/22/2020  . Chills (without fever) 02/23/2020  . Shortness of breath 12/27/2019  . Screening for hyperlipidemia 12/03/2019  . Bruising 07/21/2019  . Healthcare maintenance 05/23/2018  . Chronic respiratory failure with hypoxia and hypercapnia ([place]) 04/13/2018  . Weight loss 08/02/2017  . Severe episode of recurrent major depressive disorder, without psychotic features ([place]) 04/25/2017  . Encounter for HCV screening test for low risk patient 03/10/2017  . History of hyperkalemia 02/13/2016  . Skin lesion of back 11/15/2014  . Atypical chest pain 10/21/2014  . Cataract 10/21/2014  . Dyslipidemia, goal  to be determined 04/22/2014  . Lipoma 10/07/2012  . Cervical spine arthritis 08/27/2012  . History of breast cancer 07/23/2012  . GERD (gastroesophageal reflux disease) 02/03/2012  . Anemia 07/29/2011  . COPD GOLD IV 06/24/2011  . Liver cyst 06/24/2011  . Insomnia 06/24/2011   PCP:  Matilde Haymaker, MD Pharmacy:   [place]), [place] - [address] [place] ([place]) [place] [address] Phone: [phone] Fax:  [phone]     Social Determinants of Health (SDOH) Interventions    Readmission Risk Interventions No flowsheet data found.

## 2020-08-12 NOTE — Consult Note (Signed)
Providing Compassionate, Quality Care - Together  Neurosurgery Consult  Referring physician: Dr. Ron Parker Reason for referral: T11 compression fracture  Chief Complaint: Back pain, leg pain  History of Present Illness: This is a 64 year old female with end-stage COPD and presented with mid back pain as well as difficulty ambulating and thigh pain. She chronically takes steroids for her end-stage COPD. She denies any recent trauma. She denies any numbness or tingling in her lower extremities. She states her thigh pain is improved and now she mainly has mid back pain. She currently has a TLSO brace on. She denies any weakness in her legs.  CT of the chest was performed to rule out PE which was negative, CT L spine showed T11 compression fracture without retropulsion or subluxation.  Medications: I have reviewed the patient's current medications. Allergies: No Known Allergies  History reviewed. No pertinent family history. Social History:  has no history on file for tobacco use, alcohol use, and drug use.  ROS: 14 point review of systems was obtained which all pertinent positives and negatives are listed in HPI above  Physical Exam:  Vital signs in last 24 hours: Temp:  [98 F (36.7 C)-98.3 F (36.8 C)] 98 F (36.7 C) (07/25 1814) Pulse Rate:  [58-128] 65 (07/26 0746) Resp:  [11-18] 14 (07/26 0217) BP: (138-182)/(65-125) 153/88 (07/26 0700) SpO2:  [91 %-98 %] 96 % (07/26 0746) PE: AOx3 No acute distress Does get short of breath while talking Wearing TLSO Moves all extremities equally Sensory intact light touch throughout I did not test her thoracic tenderness due to currently wearing her TLSO Face symmetric Communicating appropriately    Impression/Assessment:  64 year old female with  1. T11 compression fracture, unknown acuity -Likely secondary to chronic steroid use/end-stage COPD  Plan:  -TLSO for pain -Pain control -Rehab eval -No acute surgical intervention.  Patient is currently being evaluated by palliative for possible goals of care. I do not believe she would tolerate any surgical intervention. -Can follow-up in my office in approximately 2 to 3 weeks. -will signoff   Thank you for allowing me to participate in this patient's care.  Please do not hesitate to call with questions or concerns.   Elwin Sleight, [facility] Cell: [phone]

## 2020-08-12 NOTE — ED Provider Notes (Signed)
[facility] EMERGENCY DEPARTMENT Provider Note   CSN: [id number] Arrival date & time: 08/11/20  2012     History No chief complaint on file.   Mary Jenkins is a 64 y.o. female.  At the time my evaluation the patient had been in the waiting room for approximately 7/2 hours.  Apparently she initially came in with some left thigh pain that been going on for a couple weeks.  She states that she thought that it was slightly warm and tender.  No swelling.  Patient apparently got dyspneic on the way back from the waiting room and then was also complaining of back pain.  She states that she has lower thoracic back pain bilaterally.  Radiates out.  She states it may be from sit up for so long.  She states that her shortness of breath is pretty significant whenever she exerts herself.  She states this is unchanged over the last couple days however has been worsening over the last couple months.  She has had a cough.  She has had no fever.  No chest pain otherwise.  No history of blood clots not on anticoagulation.  Has not done anything for her symptoms.        Past Medical History:  Diagnosis Date  . Breast cancer ([place])   . Cataract, immature    bilateral  . COPD (chronic obstructive pulmonary disease) ([place])    no home O2  . Cough 02/09/2015  . DDD (degenerative disc disease), cervical   . Exertional shortness of breath    states if she "takes her time" doing activities does not get SOB  . Family history of adverse reaction to anesthesia    pt's sister has hx. of post-op N/V  . Full dentures   . GERD (gastroesophageal reflux disease) 02/03/2012  . High cholesterol    no current med.  Marland Kitchen History of breast cancer   . Personal history of chemotherapy 1997  . Runny nose 02/09/2015   clear drainage, per pt.    Patient Active Problem List   Diagnosis Date Noted  . Dysphagia 04/23/2020  . Acute respiratory distress 04/22/2020  . Chills (without fever) 02/23/2020   . Shortness of breath 12/27/2019  . Screening for hyperlipidemia 12/03/2019  . Bruising 07/21/2019  . Healthcare maintenance 05/23/2018  . Chronic respiratory failure with hypoxia and hypercapnia ([place]) 04/13/2018  . Weight loss 08/02/2017  . Severe episode of recurrent major depressive disorder, without psychotic features ([place]) 04/25/2017  . Encounter for HCV screening test for low risk patient 03/10/2017  . History of hyperkalemia 02/13/2016  . Skin lesion of back 11/15/2014  . Atypical chest pain 10/21/2014  . Cataract 10/21/2014  . Dyslipidemia, goal to be determined 04/22/2014  . Lipoma 10/07/2012  . Cervical spine arthritis 08/27/2012  . History of breast cancer 07/23/2012  . GERD (gastroesophageal reflux disease) 02/03/2012  . Anemia 07/29/2011  . COPD GOLD IV 06/24/2011  . Liver cyst 06/24/2011  . Insomnia 06/24/2011    Past Surgical History:  Procedure Laterality Date  . BREAST CAPSULOTOMY WITH IMPLANT EXCHANGE Right 06/21/2013   Procedure: REVISION RIGHT BREAST RECONSTRUCTION/REMOVAL OF RIGHT IMPLANT/RIGHT BREAST CAPSULOTOMY WITH INSERT TISSUE EXPLANDER RIGHT BREAST/POSSIBLE LEFT BREAST MASTOPEXY;  Surgeon: Cristine Polio, MD;  Location: [facility];  Service: Plastics;  Laterality: Right;  . BREAST RECONSTRUCTION Right 06/21/2013   Procedure: BREAST RECONSTRUCTION;  Surgeon: Cristine Polio, MD;  Location: [place] [facility];  Service: Plastics;  Laterality: Right;  .  BREAST RECONSTRUCTION Left 11/29/2013   Procedure: LEFT MASTOPEXY FOR RECONSTRUCTION;  Surgeon: Cristine Polio, MD;  Location: [facility];  Service: Plastics;  Laterality: Left;  . BREAST RECONSTRUCTION Right 03/14/2014   Procedure: RECONSTRUCTION NIPPLE RIGHT BREAST;  Surgeon: Cristine Polio, MD;  Location: [facility];  Service: Plastics;  Laterality: Right;  . BREAST SURGERY  97   implant  . CAPSULOTOMY Right 06/30/2017   Procedure: CAPSULOTOMY;   Surgeon: Cristine Polio, MD;  Location: [facility];  Service: Plastics;  Laterality: Right;  . COLONOSCOPY    . ESOPHAGOGASTRODUODENOSCOPY (EGD) WITH PROPOFOL N/A 04/25/2020   Procedure: ESOPHAGOGASTRODUODENOSCOPY (EGD) WITH PROPOFOL;  Surgeon: Milus Banister, MD;  [facility] ENDOSCOPY;  Service: Endoscopy;  Laterality: N/A;  . MASTECTOMY, PARTIAL Right 1997   -node dissection  . REDUCTION MAMMAPLASTY Left   . REMOVAL OF TISSUE EXPANDER AND PLACEMENT OF IMPLANT Right 02/13/2015   Procedure: REMOVAL OF TISSUE  EXPANDER  PORT RIGHT BREAST;  Surgeon: Cristine Polio, MD;  Location: [facility];  Service: Plastics;  Laterality: Right;  . REMOVAL OF TISSUE EXPANDER AND PLACEMENT OF IMPLANT Right 06/30/2017   Procedure: REMOVAL OF RIGHT TISSUE EXPANDER WITH PLACEMENT OF RIGHT GELL BREAST IMPLANTS;  Surgeon: Cristine Polio, MD;  Location: [facility];  Service: Plastics;  Laterality: Right;  . SCAR REVISION Right 02/13/2015   Procedure: SCAR REVISION RIGHT BREAST;  Surgeon: Cristine Polio, MD;  Location: [facility];  Service: Plastics;  Laterality: Right;  . TISSUE EXPANDER PLACEMENT Right 03/14/2014   Procedure: SALINE REMOVAL RIGHT TISSUE EXPANDER;  Surgeon: Cristine Polio, MD;  Location: [facility];  Service: Plastics;  Laterality: Right;  . TUBAL LIGATION  1980's     OB History   No obstetric history on file.     Family History  Problem Relation Age of Onset  . Stroke Mother   . Heart disease Brother   . Heart disease Brother   . Anesthesia problems Sister        post-op N/V  . Cancer Sister        Endometrial CA  . Heart disease Brother   . Tuberculosis Daughter        not active  . Allergic rhinitis Daughter   . Allergic rhinitis Son   . Hypertension Sister   . Diabetes Sister   . Breast cancer Neg Hx     Social History   Tobacco Use  . Smoking status: Former Smoker    Quit date: 07/23/2012      Years since quitting: 8.0  . Smokeless tobacco: Never Used  Vaping Use  . Vaping Use: Never used  Substance Use Topics  . Alcohol use: No    Alcohol/week: 0.0 standard drinks    Comment: one beer every now and then  . Drug use: No    Home Medications Prior to Admission medications   Medication Sig Start Date End Date Taking? Authorizing Provider  albuterol (PROAIR HFA) 108 (90 Base) MCG/ACT inhaler Inhale 2 puffs into the lungs every 6 (six) hours as needed for wheezing or shortness of breath. 03/06/20   Matilde Haymaker, MD  albuterol (PROVENTIL) (2.5 MG/3ML) 0.083% nebulizer solution Take 3 mLs (2.5 mg total) by nebulization every 6 (six) hours as needed for wheezing or shortness of breath. 05/03/20   Martyn Ehrich, NP  aspirin EC 81 MG tablet Take 81 mg by mouth daily. Swallow whole.    [provider]  Budeson-Glycopyrrol-Formoterol (BREZTRI AEROSPHERE) 160-9-4.8 MCG/ACT AERO Inhale 2 puffs into the lungs 2 (two) times daily. 12/28/19   Mullis, Kiersten P, DO  cycloSPORINE (RESTASIS) 0.05 % ophthalmic emulsion Place 1 drop into both eyes 2 (two) times daily.     [provider]  famotidine (PEPCID) 20 MG tablet Take 1 tablet (20 mg total) by mouth at bedtime. 05/03/20   Martyn Ehrich, NP  ferrous sulfate 324 (65 Fe) MG TBEC Take 1 tablet (325 mg total) by mouth daily. OK to take every other day if it is constipating. 05/15/20   Matilde Haymaker, MD  mirtazapine (REMERON) 15 MG tablet TAKE 1 TABLET BY MOUTH AT BEDTIME 05/22/20   Matilde Haymaker, MD  OXYGEN Place 3 L into the nose continuous.     [provider]  pantoprazole (PROTONIX) 40 MG tablet Take 1 tablet (40 mg total) by mouth daily. 05/03/20   Martyn Ehrich, NP  predniSONE (DELTASONE) 10 MG tablet Take  4 each am x 2 days,   2 each am x 2 days,  1 each am x 2 days and stop 06/19/20   Tanda Rockers, MD  roflumilast (DALIRESP) 500 MCG TABS tablet Take 1 tablet (500 mcg total) by mouth daily. 05/17/20    Martyn Ehrich, NP  valACYclovir (VALTREX) 1000 MG tablet TAKE 1 TABLET BY MOUTH TWICE DAILY FOR 3 DAYS AS NEEDED Patient taking differently: Take 1,000 mg by mouth 2 (two) times daily as needed (for flare up).  05/24/19   Shelly Bombard, MD    Allergies    Patient has no known allergies.  Review of Systems   Review of Systems  All other systems reviewed and are negative.   Physical Exam Updated Vital Signs BP (!) 120/94 (BP Location: Left Arm)   Pulse 85   Temp 98.1 F (36.7 C) (Oral)   Resp 12   SpO2 100%   Physical Exam Vitals and nursing note reviewed.  Constitutional:      Appearance: She is well-developed.  HENT:     Head: Normocephalic and atraumatic.     Nose: Nose normal. No congestion or rhinorrhea.     Mouth/Throat:     Mouth: Mucous membranes are moist.     Pharynx: Oropharynx is clear.  Eyes:     Pupils: Pupils are equal, round, and reactive to light.  Cardiovascular:     Rate and Rhythm: Normal rate and regular rhythm.  Pulmonary:     Effort: No respiratory distress.     Breath sounds: No stridor.  Abdominal:     General: There is no distension.  Musculoskeletal:        General: No swelling or tenderness. Normal range of motion.     Cervical back: Normal range of motion.  Skin:    General: Skin is warm and dry.  Neurological:     General: No focal deficit present.     Mental Status: She is alert.     ED Results / Procedures / Treatments   Labs (all labs ordered are listed, but only abnormal results are displayed) Labs Reviewed  RESPIRATORY PANEL BY RT PCR (FLU A&B, COVID)  CBC WITH DIFFERENTIAL/PLATELET  COMPREHENSIVE METABOLIC PANEL    EKG None  Radiology DG Chest Portable 1 View  Result Date: 08/12/2020 CLINICAL DATA:  Shortness of breath. EXAM: PORTABLE CHEST 1 VIEW COMPARISON:  April 21, 2020 FINDINGS: The heart size and mediastinal contours are within normal limits. The lungs are hyperinflated. Both lungs  are clear.  Radiopaque surgical clips are again seen overlying the lateral aspect of the upper right hemithorax. The visualized skeletal structures are unremarkable. IMPRESSION: No active disease. Electronically Signed   By: Virgina Norfolk M.D.   On: 08/12/2020 03:59   DG FEMUR MIN 2 VIEWS LEFT  Result Date: 08/11/2020 CLINICAL DATA:  Left femur pain EXAM: LEFT FEMUR 2 VIEWS COMPARISON:  None. FINDINGS: There is no evidence of fracture or other focal bone lesions. Soft tissues are unremarkable. IMPRESSION: Negative. Electronically Signed   By: Rolm Baptise M.D.   On: 08/11/2020 20:56    Procedures Procedures (including critical care time)  Medications Ordered in ED Medications  oxyCODONE-acetaminophen (PERCOCET/ROXICET) 5-325 MG per tablet 1 tablet (1 tablet Oral Given 08/11/20 2028)  albuterol (VENTOLIN HFA) 108 (90 Base) MCG/ACT inhaler 4 puff (has no administration in time range)  fentaNYL (SUBLIMAZE) injection 50 mcg (has no administration in time range)  methylPREDNISolone sodium succinate (SOLU-MEDROL) 125 mg/2 mL injection 125 mg (125 mg Intravenous Given 08/12/20 0342)    ED Course  I have reviewed the triage vital signs and the nursing notes.  Pertinent labs & imaging results that were available during my care of the patient were reviewed by me and considered in my medical decision making (see chart for details).    MDM Rules/Calculators/A&P                         Will evaluate for her pain. Will also provide symptomatic treatment and workup for her back pain (likely positional form sitting in a chair) and sob (likely from waiting as well). Consider possible PE.  Care transferred pending CT scans, reevaluation and final disposition  Final Clinical Impression(s) / ED Diagnoses Final diagnoses:  Pain    Rx / DC Orders ED Discharge Orders    None       Desi Rowe, Corene Cornea, MD 08/13/20 2318

## 2020-08-13 DIAGNOSIS — S22000A Wedge compression fracture of unspecified thoracic vertebra, initial encounter for closed fracture: Secondary | ICD-10-CM

## 2020-08-13 MED ORDER — OXYCODONE HCL ER 10 MG PO T12A
10.0000 mg | EXTENDED_RELEASE_TABLET | Freq: Two times a day (BID) | ORAL | Status: DC
  Administered 2020-08-13 – 2020-08-15 (×3): 10 mg via ORAL
  Filled 2020-08-13 (×3): qty 1

## 2020-08-13 MED ORDER — FENTANYL CITRATE (PF) 100 MCG/2ML IJ SOLN: 50.0000 ug | INTRAMUSCULAR | Status: DC | PRN

## 2020-08-13 MED ORDER — FENTANYL CITRATE (PF) 100 MCG/2ML IJ SOLN
50.0000 ug | INTRAMUSCULAR | Status: DC | PRN
  Administered 2020-08-13: 50 ug via INTRAVENOUS
  Filled 2020-08-13: qty 2

## 2020-08-13 MED ORDER — OXYCODONE HCL 5 MG PO TABS: 10.0000 mg | ORAL_TABLET | ORAL | Status: DC | PRN

## 2020-08-13 MED ORDER — OXYCODONE HCL 5 MG PO TABS
5.0000 mg | ORAL_TABLET | ORAL | Status: DC | PRN
  Administered 2020-08-14 – 2020-08-16 (×4): 5 mg via ORAL
  Filled 2020-08-13 (×4): qty 1

## 2020-08-13 NOTE — Hospital Course (Addendum)
Mary Jenkins is a 64 y.o. female  who presented with thigh pain and back pain. PMH is significant for COPD GOLD IV, Anemia, MDD, GERD, H/O breast Cancer, Cervical spine arthritis and Cataract.   T11 compression fracture in setting of osteopenia Patient found to have compression fracture likely secondary to chronic steroid use for COPD.  Patient initially treated with fentanyl in the ED and transitioned to morphine overnight for for hospitalization.  Attempted oral pain management with oxycodone and fentanyl for breakthrough pain.  Patient was evaluated by neurosurgery in the ED and placed in TLSO brace with plan for follow-up as outpatient once patient is discharged.  Physical therapy evaluated patient and recommending home health PT. Began course of nasal calcitonin to relieve osteogenic pain; sent home with script for one spray per day for 4-6 weeks. Recommend PCP follow up.   Goals of care for COPD, Gold 4 Patient spoke with palliative care regarding her goals of care for her end-stage COPD.  Patient was continued on home inhaler medications while admitted.  Patient plans to remain full code and not ready for hospice.  Her goal is to improve strength in with goal of going home.

## 2020-08-14 DIAGNOSIS — S22000A Wedge compression fracture of unspecified thoracic vertebra, initial encounter for closed fracture: Secondary | ICD-10-CM | POA: Diagnosis not present

## 2020-08-14 MED ORDER — CALCITONIN (SALMON) 200 UNIT/ACT NA SOLN
1.0000 | Freq: Every day | NASAL | Status: DC
  Administered 2020-08-14 – 2020-08-16 (×3): 1 via NASAL
  Filled 2020-08-14: qty 3.7

## 2020-08-14 NOTE — Progress Notes (Addendum)
Family Medicine Teaching Service Daily Progress Note Intern Pager: [phone]  Patient name: Mary Jenkins Medical record number: [id number] Date of birth: [birth year] Age: 64 y.o. Gender: female  Primary Care Provider: Matilde Haymaker, MD  Pt Overview and Major Events to Date:  08/12/2020: Patient admitted with severe thigh pain, found to have T11 compression fracture, placed in brace by neurosurgery, recommended for discharge to SNF,    Assessment and Plan: Mary Jenkins is a 64 y.o. female  who presented with thigh pain and back pain. PMH is significant for COPD GOLD IV, Anemia, MDD, GERD, H/O breast Cancer, Cervical spine arthritis and Cataract.   T11 compression fracture in setting of osteopenia She c/o of severe pain in back. Rates pain as 8/10.  She states brace and pain killers helps with pain. -continue oxycodone 83m every 12 hrs and 5 mg every 4 hrs PRN. - continue Tylenol q 6 hours  -Continue TLSO brace with Neurosurgeon outpatient follow-up in 2-3 weeks following discharge. Neurosurgery signed off. - PT/OT recommending SNF placement at time of discharge  -Start Calcitonin nasal spray for osteoporosis.  COPD, GOLD IV Home medications include: albuterol, breztri 2 puffs BID, daliresp 500 mg daily. Pt on 3L of Home O2. Saturation 97% at 3L of O2 via nasal cannula. This morning denies any SOB. Breathing comfortably at 3L. - continue daliresp, albuterol and dulera with incruse ellipta    Anemia, normocytic   Hb on admission 11.3, MCV 86.1, Chronically low Hb.Her last Iron studies was 05/01/20 with Normal Iron of 76 and TIBC of 318. Pt is on ferrous sulfate 325 mg daily for home medication. -Continue iron 325 mg daily.   GERD  Continue Protonix 412mdaily      FEN/GI: regular diet    PPx: Lovenox     Disposition: Med Surg To SNF at D/c  Subjective:  She c/o of severe pain in back. Rates pain as 8/10.  She states brace and pain killers helps with pain. Denies any  shortness of breath and chest pain.  Objective: Temp:  [97.8 F (36.6 C)-98.3 F (36.8 C)] 97.8 F (36.6 C) (11/22 0440) Pulse Rate:  [63-83] 63 (11/22 0440) Resp:  [16-20] 16 (11/22 0440) BP: (100-134)/(65-97) 103/77 (11/22 0440) SpO2:  [92 %-100 %] 97 % (11/22 0440) FiO2 (%):  [3 %] 3 % (11/21 2003) Physical Exam: General: Not in acute distress.  breathing comfortably at 3 L oxygen via nasal canula.  Cardiovascular: S1, S2 normal, no murmurs rubs and gallops Respiratory: Breath Sounds equal clear. Gastrointestinal: No swelling, Soft, nontender, BS +nt Extremities: No edema, B/L pulses equal.  Neuro- No focal deficit. B/L muscle Strength good.  Laboratory: Recent Labs  Lab 08/12/20 0427  WBC 9.1  HGB 11.3*  HCT 38.5  PLT 320   Recent Labs  Lab 08/12/20 0427  NA 142  K 3.6  CL 101  CO2 28  BUN 15  CREATININE 0.88  CALCIUM 9.2  PROT 6.9  BILITOT 0.5  ALKPHOS 51  ALT 14  AST 23  GLUCOSE 89      Imaging/Diagnostic Tests: CT Angio Chest PE W and/or Wo Contrast  Result Date: 08/12/2020 CLINICAL DATA:  Increased shortness of breath this morning EXAM: CT ANGIOGRAPHY CHEST WITH CONTRAST TECHNIQUE: Multidetector CT imaging of the chest was performed using the standard protocol during bolus administration of intravenous contrast. Multiplanar CT image reconstructions and MIPs were obtained to evaluate the vascular anatomy. CONTRAST:  6063mMNIPAQUE IOHEXOL 350 MG/ML SOLN COMPARISON:  12/27/2019 FINDINGS: Cardiovascular: Satisfactory opacification of the pulmonary arteries to the segmental level. No evidence of pulmonary embolism. Normal heart size. No pericardial effusion. Mediastinum/Nodes: Negative for adenopathy or mass Lungs/Pleura: Advanced centrilobular/panlobular emphysema. 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 There is no edema, consolidation, effusion, or pneumothorax. Calcified pulmonary nodules at the left lung base. No worrisome nodule  Upper Abdomen: No acute finding Musculoskeletal: No acute or aggressive finding. Other: Right mastectomy and axillary dissection. Right breast reconstruction. Review of the MIP images confirms the above findings. IMPRESSION: 1. Negative for pulmonary embolism or other acute finding. 2. Advanced COPD Aortic Atherosclerosis (ICD10-I70.0). Electronically Signed   By: Monte Fantasia M.D.   On: 08/12/2020 08:25   CT Lumbar Spine Wo Contrast  Result Date: 08/12/2020 CLINICAL DATA:  64 year old female with 2 weeks of low back pain. No known injury. Remote history of breast cancer. EXAM: CT LUMBAR SPINE WITHOUT CONTRAST TECHNIQUE: Multidetector CT imaging of the lumbar spine was performed without intravenous contrast administration. Multiplanar CT image reconstructions were also generated. COMPARISON:  CTA chest today reported separately. Chest CTA 12/27/2019. CT Abdomen and Pelvis 12/08/2017. FINDINGS: Segmentation: Normal. Alignment: Stable straightening of lumbar lordosis since 2019. Vertebrae: Osteopenia. Lumbar levels appear intact, with stable lumbar vertebral height compared to 2019. Visible sacrum and SI joints appear intact. Mostly included T11 vertebral level is remarkable for a superior endplate deformity with up to 30% loss of vertebral body height which is much more conspicuous than on the CTA earlier today (series 7, image 34). The superior endplate fracture appears mildly comminuted an un healed (series 4, image 2). But with no retropulsion or complicating features. T11 posterior elements appear intact. Paraspinal and other soft tissues: Lower chest is stable from that reported earlier today. Visible abdominal viscera appear stable since 2019, with symmetric excretion of IV contrast from the kidneys to nondilated ureters. Aortoiliac calcified atherosclerosis. Negative lower thoracic and lumbar paraspinal soft tissues. Disc levels: Lumbar spine degeneration appears generally mild for age and not  significantly changed from 2019. Disc bulging is most pronounced at L4 where trace vacuum disc is apparent. There is up to mild spinal stenosis at L2-L3 and L4-L5. IMPRESSION: 1. Osteopenia with T11 superior endplate compression fracture suspected to be acute or subacute. 30% loss of vertebral body height No retropulsion or complicating features. If specific therapy such as vertebral augmentation is desired, Lumbar MRI without contrast or Nuclear Medicine Whole-body Bone Scan would confirm acuity and candidacy for vertebroplasty. 2. No acute osseous abnormality in the lumbar spine, and lumbar spine degeneration appears stable since 2019. 3. Aortic Atherosclerosis (ICD10-I70.0) and Emphysema (ICD10-J43.9). Electronically Signed   By: Genevie Ann M.D.   On: 08/12/2020 12:52   DG Chest Portable 1 View  Result Date: 08/12/2020 CLINICAL DATA:  Shortness of breath. EXAM: PORTABLE CHEST 1 VIEW COMPARISON:  April 21, 2020 FINDINGS: The heart size and mediastinal contours are within normal limits. The lungs are hyperinflated. Both lungs are clear. Radiopaque surgical clips are again seen overlying the lateral aspect of the upper right hemithorax. The visualized skeletal structures are unremarkable. IMPRESSION: No active disease. Electronically Signed   By: Virgina Norfolk M.D.   On: 08/12/2020 03:59   DG FEMUR MIN 2 VIEWS LEFT  Result Date: 08/11/2020 CLINICAL DATA:  Left femur pain EXAM: LEFT FEMUR 2 VIEWS COMPARISON:  None. FINDINGS: There is no evidence of fracture or other focal bone lesions. Soft tissues are unremarkable. IMPRESSION: Negative. Electronically Signed   By: Rolm Baptise M.D.  On: 08/11/2020 20:56   VAS [place] LOWER EXTREMITY VENOUS (DVT) (ONLY [facility] & [facility])  Result Date: 08/12/2020  Lower Venous DVT Study Indications: Edema.  Performing Technologist: [name] RVS  Examination Guidelines: A complete evaluation includes B-mode imaging, spectral Doppler, color Doppler, and power Doppler as needed of  all accessible portions of each vessel. Bilateral testing is considered an integral part of a complete examination. Limited examinations for reoccurring indications may be performed as noted. The reflux portion of the exam is performed with the patient in reverse Trendelenburg.  +-----+---------------+---------+-----------+----------+--------------+ RIGHTCompressibilityPhasicitySpontaneityPropertiesThrombus Aging +-----+---------------+---------+-----------+----------+--------------+ CFV  Full           Yes      Yes                                 +-----+---------------+---------+-----------+----------+--------------+   +---------+---------------+---------+-----------+----------+--------------+ LEFT     CompressibilityPhasicitySpontaneityPropertiesThrombus Aging +---------+---------------+---------+-----------+----------+--------------+ CFV      Full           Yes      Yes                                 +---------+---------------+---------+-----------+----------+--------------+ SFJ      Full                                                        +---------+---------------+---------+-----------+----------+--------------+ FV Prox  Full                                                        +---------+---------------+---------+-----------+----------+--------------+ FV Mid   Full                                                        +---------+---------------+---------+-----------+----------+--------------+ FV DistalFull                                                        +---------+---------------+---------+-----------+----------+--------------+ PFV      Full                                                        +---------+---------------+---------+-----------+----------+--------------+ POP      Full           Yes      Yes                                 +---------+---------------+---------+-----------+----------+--------------+ PTV      Full                                                         +---------+---------------+---------+-----------+----------+--------------+  PERO     Full                                                        +---------+---------------+---------+-----------+----------+--------------+     Summary: RIGHT: - No evidence of deep vein thrombosis in the lower extremity. No indirect evidence of obstruction proximal to the inguinal ligament.  LEFT: - There is no evidence of deep vein thrombosis in the lower extremity.  - No cystic structure found in the popliteal fossa.  *See table(s) above for measurements and observations. Electronically signed by Deitra Mayo MD on 08/12/2020 at 7:57:22 PM.    Final     Armando Reichert, MD 08/14/2020, 5:56 AM PGY-1, [place] Intern pager: [phone], text pages welcome

## 2020-08-14 NOTE — Progress Notes (Signed)
Pt refused scheduled Oxycontin extended release stating that it made her feel nauseous all day. Patient stated she is okay with just taking her schedule tylenol. Will continue to monitor patients pain level.

## 2020-08-14 NOTE — Progress Notes (Signed)
Pt and pt's daughter requesting PT consult.   Justice Rocher, RN

## 2020-08-15 DIAGNOSIS — R52 Pain, unspecified: Secondary | ICD-10-CM | POA: Diagnosis not present

## 2020-08-15 MED ORDER — LIP MEDEX EX OINT
1.0000 "application " | TOPICAL_OINTMENT | CUTANEOUS | Status: DC | PRN
  Filled 2020-08-15: qty 7

## 2020-08-15 MED ORDER — ONDANSETRON 4 MG PO TBDP
4.0000 mg | ORAL_TABLET | ORAL | Status: DC | PRN
  Administered 2020-08-15 – 2020-08-16 (×5): 4 mg via ORAL
  Filled 2020-08-15 (×5): qty 1

## 2020-08-15 MED ORDER — ALBUTEROL SULFATE (2.5 MG/3ML) 0.083% IN NEBU: 2.5000 mg | INHALATION_SOLUTION | RESPIRATORY_TRACT | Status: DC | PRN

## 2020-08-15 MED ORDER — ALBUTEROL SULFATE (2.5 MG/3ML) 0.083% IN NEBU
INHALATION_SOLUTION | RESPIRATORY_TRACT | Status: AC
  Administered 2020-08-15: 2.5 mg
  Filled 2020-08-15: qty 3

## 2020-08-15 NOTE — Evaluation (Signed)
Physical Therapy Evaluation Patient Details Name: Mary Jenkins MRN: [id number] DOB: 1956-08-23 Today's Date: 08/15/2020   History of Present Illness  64 yo female admitted to ED on 11/19 with L thigh pain and numbness, back pain x2 weeks. Chest CT - for acute PE, + T11 compression fracture. PMH includes breast cancer, COPD, cervical DDD, GERD.  Clinical Impression   Pt presents with generalized weakness, mild to moderate back pain with mobility, increased time and effort to mobilize, and decreased activity tolerance vs baseline. Pt to benefit from acute PT to address deficits. Pt ambulated room distance x2, with and without AD, with close guard for safety. Pt required no physical assist for mobility, and has 24/7 assist at home between daughter (works 3rd shift) and sister. PT recommending HHPT to return to PLOF and maximize safety with mobility. PT to progress mobility as tolerated, and will continue to follow acutely.      Follow Up Recommendations Home health PT;Supervision for mobility/OOB    Equipment Recommendations  None recommended by PT    Recommendations for Other Services       Precautions / Restrictions Precautions Precautions: Fall;Back Precaution Booklet Issued: Yes (comment) Precaution Comments: back precautions reviewed for pt safety and comfort - BLT rules, log roll in and out of bed Required Braces or Orthoses: Spinal Brace Spinal Brace: Thoracolumbosacral orthotic;Other (comment) Spinal Brace Comments: for comfort, applied in sitting Restrictions Weight Bearing Restrictions: No      Mobility  Bed Mobility Overal bed mobility: Needs Assistance Bed Mobility: Rolling;Sidelying to Sit Rolling: Supervision Sidelying to sit: Supervision       General bed mobility comments: for safety, increased time and effort    Transfers Overall transfer level: Needs assistance Equipment used: None;Rolling walker (2 wheeled) Transfers: Sit to/from [employer] Sit to Stand: Min guard Stand pivot transfers: Min guard       General transfer comment: for safety, increased time to rise and steady. Transfer to [facility] at bedside with increased time, STS x2 from EOB and BSC.  Ambulation/Gait Ambulation/Gait assistance: Min guard Gait Distance (Feet): 25 Feet (x2) Assistive device: Rolling walker (2 wheeled);None Gait Pattern/deviations: Step-through pattern;Decreased stride length Gait velocity: decr   General Gait Details: Min guard for safety, first 25 ft with RW and last 25 ft without. Verbal cuing for upright posture, slow and steady gait.  Stairs            Wheelchair Mobility    Modified Rankin (Stroke Patients Only)       Balance Overall balance assessment: Needs assistance Sitting-balance support: No upper extremity supported;Feet supported Sitting balance-Leahy Scale: Good     Standing balance support: No upper extremity supported;During functional activity Standing balance-Leahy Scale: Fair                               Pertinent Vitals/Pain Pain Assessment: Faces Faces Pain Scale: Hurts a little bit Pain Location: back Pain Descriptors / Indicators: Grimacing Pain Intervention(s): Limited activity within patient's tolerance;Monitored during session    Home Living Family/patient expects to be discharged to:: Private residence Living Arrangements: Children;Other relatives (sister) Available Help at Discharge: Family;Available 24 hours/day Type of Home: Apartment Home Access: Level entry     Home Layout: One level Home Equipment: Cane - single point;Shower seat;Walker - 4 wheels      Prior Function Level of Independence: Needs assistance   Gait / Transfers Assistance Needed: Pt reports using  rollator in community, does not use AD in the house  ADL's / Homemaking Assistance Needed: Pt's daughter bathes and cooks for her, independent in dressing self with increased time. Pt does not  drive        Hand Dominance   Dominant Hand: Right    Extremity/Trunk Assessment   Upper Extremity Assessment Upper Extremity Assessment: Overall WFL for tasks assessed    Lower Extremity Assessment Lower Extremity Assessment: Generalized weakness    Cervical / Trunk Assessment Cervical / Trunk Assessment: Normal  Communication   Communication: No difficulties  Cognition Arousal/Alertness: Awake/alert Behavior During Therapy: WFL for tasks assessed/performed Overall Cognitive Status: Within Functional Limits for tasks assessed                                        General Comments General comments (skin integrity, edema, etc.): on 3LO2 at baseline, SpO2 98% post-ambulation    Exercises     Assessment/Plan    PT Assessment Patient needs continued PT services  PT Problem List Decreased strength;Decreased mobility;Decreased safety awareness;Decreased knowledge of precautions;Decreased activity tolerance;Decreased balance;Pain;Decreased knowledge of use of DME;Cardiopulmonary status limiting activity       PT Treatment Interventions DME instruction;Therapeutic activities;Therapeutic exercise;Gait training;Patient/family education;Balance training;Functional mobility training    PT Goals (Current goals can be found in the Care Plan section)  Acute Rehab PT Goals Patient Stated Goal: decrease back pain PT Goal Formulation: With patient Time For Goal Achievement: 08/29/20 Potential to Achieve Goals: Good    Frequency Min 3X/week   Barriers to discharge        Co-evaluation               AM-PAC PT "6 Clicks" Mobility  Outcome Measure Help needed turning from your back to your side while in a flat bed without using bedrails?: None Help needed moving from lying on your back to sitting on the side of a flat bed without using bedrails?: None Help needed moving to and from a bed to a chair (including a wheelchair)?: A Little Help needed  standing up from a chair using your arms (e.g., wheelchair or bedside chair)?: A Little Help needed to walk in hospital room?: A Little Help needed climbing 3-5 steps with a railing? : A Little 6 Click Score: 20    End of Session Equipment Utilized During Treatment: Back brace;Oxygen Activity Tolerance: Patient tolerated treatment well Patient left: in chair;with call bell/phone within reach Nurse Communication: Mobility status PT Visit Diagnosis: Muscle weakness (generalized) (M62.81);Other abnormalities of gait and mobility (R26.89)    Time: 9702-6378 PT Time Calculation (min) (ACUTE ONLY): 25 min   Charges:   PT Evaluation $PT Eval Low Complexity: 1 Low PT Treatments $Gait Training: 8-22 mins        Barrie Sigmund E, PT Acute Rehabilitation Services Pager [phone]  Office [phone]   Hammad Finkler D Elonda Husky 08/15/2020, 11:27 AM

## 2020-08-15 NOTE — Progress Notes (Signed)
Family Medicine Teaching Service Daily Progress Note Intern Pager: [phone]  Patient name: Mary Jenkins Medical record number: [id number] Date of birth: 10-Apr-1956 Age: 64 y.o. Gender: female  Primary Care Provider: Matilde Haymaker, MD Consultants: None Code Status: Full  Pt Overview and Major Events to Date:  Admission on 08/13/20  Pt Overview and Major Events to Date: 08/12/2020: Patient admitted with severe thigh pain, found to have T11 compression fracture, placed in brace by neurosurgery,recommended for discharge to SNF,  Assessment and Plan: Mary Krahl Scottis a 64 y.o.femalewho presentedwith thigh pain and back pain. PMH is significant forCOPDGOLD IV, Anemia, MDD, GERD, H/O breast Cancer, Cervical spine arthritis and Cataract.  T11 compression fracture in setting of osteopenia She C/o pain in her back. Pt states her pain is 8/10 in intensity. She states Oxycodone does help her with her pain but It causes nausea if she doesn't eat something with it. Pt was feeling nauseous from Oxycodone. Zofran was Prescribed for nausea yesterday. Pt recommended Home health PT. Still waiting for OT eval.   -continue oxycodone 78m every 12 hrs and 5 mg every 4 hrs PRN.  - continueTylenol q 6 hours -Continue TLSO bracewith Neurosurgeon outpatient follow-up in 2-3 weeks following discharge. Neurosurgery signed off. - OT F/U -Transition for care call for D/C planning. -We'll talk to family about D/C planning. -Continue Calcitonin nasal spray for osteoporosis.  COPD,GOLD IV Home medications include: albuterol, breztri 2 puffs BID, daliresp 500 mg daily.Pt on 3L of Home O2.Saturation 98% at 3L of O2 via nasal cannula. This morning denies any SOB. Breathing comfortably at 3L O2. - continue daliresp, albuterol and dulera with incruse ellipta  Anemia, normocytic Hbon admission11.3, MCV 86.1, Chronically low Hb.Her last Iron studies was 05/01/20 with Normal Iron of 76  and TIBC of 318. Pt is onferrous sulfate 325 mg dailyfor home medication. -Continueiron325 mg daily.  GERD  Continue Protonix 431mdaily   FEN/GI:regular diet  PPXJO:ITGPQDI Disposition: Med Surg To SNF at D/c  Subjective:  She C/o pain in her back. Pt states her pain is 8/10 in intensity. She states Oxycodone does help her with her pain but It causes nausea if she doesn't eat something with it. Pt was feeling nauseous from Oxycodone. Zofran was Prescribed for nausea yesterday. She denies any Chest pain, SOB, abdominal pain, nausea, vomiting and abdominal pain.  Objective: Temp:  [97.3 F (36.3 C)-98.7 F (37.1 C)] 98.1 F (36.7 C) (11/23 0326) Pulse Rate:  [70-78] 74 (11/23 0326) Resp:  [18-20] 20 (11/22 0759) BP: (112-134)/(77-89) 132/79 (11/23 0326) SpO2:  [95 %-100 %] 95 % (11/23 0326) FiO2 (%):  [32 %] 32 % (11/22 0758) Physical Exam: General:Not in acutedistress. breathingcomfortably at 3 L oxygen via nasal canula.  Cardiovascular:S1, S2 normal, no murmurs rubs and gallops Respiratory:BreathSounds equal and clear b/l Gastrointestinal:No swelling,Soft, nontender, BS +nt Extremities: No edema, B/L pulses equal.  Neuro- No focal deficit. Laboratory: Recent Labs  Lab 08/12/20 0427  WBC 9.1  HGB 11.3*  HCT 38.5  PLT 320   Recent Labs  Lab 08/12/20 0427  NA 142  K 3.6  CL 101  CO2 28  BUN 15  CREATININE 0.88  CALCIUM 9.2  PROT 6.9  BILITOT 0.5  ALKPHOS 51  ALT 14  AST 23  GLUCOSE 89      Imaging/Diagnostic Tests: No new results.  DoArmando ReichertMD 08/15/2020, 5:41 AM PGY-1, [place] [facility] pager: [phone] pages welcome

## 2020-08-15 NOTE — Progress Notes (Signed)
Patient requested nausea medication to be able to take pain medication (oxycodone). This nurse paged teaching service MD and got an order for zofran.

## 2020-08-16 ENCOUNTER — Telehealth: Payer: Self-pay

## 2020-08-16 ENCOUNTER — Other Ambulatory Visit: Payer: Self-pay

## 2020-08-16 DIAGNOSIS — M546 Pain in thoracic spine: Secondary | ICD-10-CM

## 2020-08-16 DIAGNOSIS — Z515 Encounter for palliative care: Secondary | ICD-10-CM | POA: Diagnosis not present

## 2020-08-16 DIAGNOSIS — J449 Chronic obstructive pulmonary disease, unspecified: Secondary | ICD-10-CM | POA: Diagnosis not present

## 2020-08-16 DIAGNOSIS — S22000A Wedge compression fracture of unspecified thoracic vertebra, initial encounter for closed fracture: Secondary | ICD-10-CM | POA: Diagnosis not present

## 2020-08-16 MED ORDER — OXYCODONE HCL 5 MG PO TABS: 5.0000 mg | ORAL_TABLET | Freq: Four times a day (QID) | ORAL | 0 refills | Status: DC | PRN

## 2020-08-16 MED ORDER — CALCITONIN (SALMON) 200 UNIT/ACT NA SOLN: 1.0000 | Freq: Every day | NASAL | 0 refills | Status: AC

## 2020-08-16 MED ORDER — CALCITONIN (SALMON) 200 UNIT/ACT NA SOLN
1.0000 | Freq: Every day | NASAL | 0 refills | Status: DC
Start: 2020-08-16 — End: 2020-08-16

## 2020-08-16 MED ORDER — OXYCODONE HCL 5 MG PO TABS: 5.0000 mg | ORAL_TABLET | Freq: Four times a day (QID) | ORAL | 0 refills | Status: AC | PRN

## 2020-08-16 MED ORDER — OXYCODONE HCL ER 10 MG PO T12A: 10.0000 mg | EXTENDED_RELEASE_TABLET | Freq: Two times a day (BID) | ORAL | 0 refills | Status: DC | PRN

## 2020-08-16 MED ORDER — OXYCODONE HCL ER 10 MG PO T12A: 10.0000 mg | EXTENDED_RELEASE_TABLET | Freq: Two times a day (BID) | ORAL | 0 refills | Status: AC | PRN

## 2020-08-16 MED ORDER — NALOXONE HCL 4 MG/0.1ML NA LIQD: 1.0000 | Freq: Once | NASAL | 0 refills | Status: AC

## 2020-08-16 NOTE — Progress Notes (Signed)
Pt called saying she was short of breath after getting up to bedside commode, pt observed sitting up in bed in tripod position.  Pt denies chest pain, O2 saturation at 97%, no change in lung sounds from prior assessment.  This nurse called respiratory therapy who came and gave nebulizer treatment which had a positive effect.  Will continue to monitor patient condition and work of breathing.

## 2020-08-16 NOTE — Progress Notes (Addendum)
Daily Progress Note   Patient Name: Mary Jenkins       Date: 08/16/2020 DOB: [birth year]  Age: 64 y.o. MRN#: [id number] Attending Physician: Lind Covert, MD Primary Care Physician: Matilde Haymaker, MD Admit Date: 08/11/2020  Reason for Follow-up: continued GOC discussion  Subjective: Patient is sitting on the side of the bed, dressed. She states she is waiting for staff to help her put her brace on so she can go home.   Patient's daughter is at bedside. She is on the phone and does not engage in conversation.  I provided patient with an advanced directive packet and a MOST form for her to review. I explained each in detail and encouraged her to complete these documents in the near future.   Length of Stay: 4  Current Medications: Scheduled Meds:  . acetaminophen  650 mg Oral Q6H  . aspirin EC  81 mg Oral Daily  . calcitonin (salmon)  1 spray Alternating Nares Daily  . cycloSPORINE  1 drop Both Eyes BID  . enoxaparin (LOVENOX) injection  40 mg Subcutaneous Q24H  . ferrous sulfate  325 mg Oral Q breakfast  . mometasone-formoterol  2 puff Inhalation BID   And  . umeclidinium bromide  1 puff Inhalation Daily  . oxyCODONE  10 mg Oral Q12H  . pantoprazole  40 mg Oral Daily  . roflumilast  500 mcg Oral Daily    Continuous Infusions:   PRN Meds: albuterol, albuterol, lip balm, ondansetron, oxyCODONE  Physical Exam Constitutional:      General: She is not in acute distress. HENT:     Head: Normocephalic and atraumatic.  Pulmonary:     Effort: Pulmonary effort is normal.  Neurological:     Mental Status: She is alert and oriented to person, place, and time.             Vital Signs: BP 128/83 (BP Location: Right Arm)   Pulse 78   Temp 97.8 F (36.6 C) (Oral)    Resp 15   Ht _0  (1.676 m)   Wt 49.9 kg   SpO2 100%   BMI 17.75 kg/m  SpO2: SpO2: 100 % O2 Device: O2 Device: Nasal Cannula O2 Flow Rate: O2 Flow Rate (L/min): 3 L/min  Intake/output summary:   Intake/Output Summary (Last 24 hours) at  08/16/2020 1310 Last data filed at 08/16/2020 0700 Gross per 24 hour  Intake 490 ml  Output --  Net 490 ml   LBM: Last BM Date:  (PTA) Baseline Weight: Weight: 49.9 kg Most recent weight: Weight: 49.9 kg       Palliative Assessment/Data: PPS 50%       Palliative Care Assessment & Plan   HPI/Patient Profile: 64 y.o. female  with past medical history of advanced COPD (GOLD IV), breast cancer (1997, s/p right mastectomy), and anemia. She presented to the emergency department on 08/11/2020 with left thigh pain. She is unable to ambulate.  ED Course: CT lumbar spine shows T11 compression fracture. Patient will be admitted for PT, pain management, palliative, and neuro consults.    Assessment: - COPD (GOLD IV) - T11 compression fracture in the setting of osteopenia - back pain secondary to compression fracture - anemia  Recommendations/Plan: - full code, full scope treatment - patient herself does not want CPR or intubation but is not willing to agree to DNR status  - patient has verbalized understanding that her condition is terminal and she wants "not to be in pain" but is not interested in hospice at this time - encouraged patient to complete advanced directive documents  Goals of Care and Additional Recommendations:  Limitations on Scope of Treatment: Full Scope Treatment  Code Status: Full code  Prognosis:   difficult to determine, but less than 6 months would not be surprising  Discharge Planning:  Home with [place] was discussed with nursing  Thank you for allowing the Palliative Medicine Team to assist in the care of this patient.   Total Time 15 minutes Prolonged Time Billed  no       Greater  than 50%  of this time was spent counseling and coordinating care related to the above assessment and plan.  Lavena Bullion, NP  Please contact Palliative Medicine Team phone at [phone] for questions and concerns.

## 2020-08-16 NOTE — Discharge Summary (Signed)
[facility] Discharge Summary  Patient name: Mary Jenkins Medical record number: [id number] Date of birth: Jan 30, 1956 Age: 64 y.o. Gender: female Date of Admission: 08/11/2020  Date of Discharge: 08/16/20 Admitting Physician: Armando Reichert, MD  Primary Care Provider: Matilde Haymaker, MD Consultants: Dr. Reatha Armour  Indication for Hospitalization: Thigh pain and Back pain  Discharge Diagnoses/Problem List:  T11 Compression Fracture COPD Gold IV MDD H/O breast Cancer Cervical spine arthritis Cataract  Disposition: Home  Discharge Condition: Stable D/C on TLSO Brace.  Discharge Exam:  General:Not in acutedistress. breathingcomfortably at 3 L oxygen via nasal canula.  Cardiovascular:S1, S2 normal, no murmurs rubs and gallops Respiratory:BreathSounds equal and clear b/l, No rhonchi, rales and wheezing. Gastrointestinal:No swelling,Soft, nontender, BS +nt Extremities:No edema, B/L pulses equal.   Brief Hospital Course:  64 year old female presented to the emergency department with severe back pain.  Work-up in the ED was significant for T11 compression fracture.  Neurosurgery was consulted by emergency room provider and recommended TLSO brace, pain control, follow-up outpatient.  Patient was admitted for pain control and possible SNF placement.  Patient was evaluated by physical therapy and Occupational Therapy who recommended home health [place] therapy and Occupational Therapy at discharge.  Her pain was ultimately well controlled on MS Contin 10 mg every 12 hours as well as oxycodone 5 mg as needed.  Patient was also started on nasal calcitonin to help osteogenic pain.  Patient was sent home with prescriptions for these 3 medications as well as Narcan.   Issues for Follow Up:  1. Can follow-up with neurosurgery Dr. Reatha Armour in 2-3 weeks. 2. Follow up with PCP. 3. Continue taking calcitonin nasally  Significant Procedures: None  Significant Labs  and Imaging:  Recent Labs  Lab 08/12/20 0427  WBC 9.1  HGB 11.3*  HCT 38.5  PLT 320   Recent Labs  Lab 08/12/20 0427  NA 142  K 3.6  CL 101  CO2 28  GLUCOSE 89  BUN 15  CREATININE 0.88  CALCIUM 9.2  ALKPHOS 51  AST 23  ALT 14  ALBUMIN 3.8    Results/Tests Pending at Time of Discharge: None  Discharge Medications:  Allergies as of 08/16/2020   No Known Allergies     Medication List    TAKE these medications   albuterol 108 (90 Base) MCG/ACT inhaler Commonly known as: ProAir HFA Inhale 2 puffs into the lungs every 6 (six) hours as needed for wheezing or shortness of breath.   albuterol (2.5 MG/3ML) 0.083% nebulizer solution Commonly known as: PROVENTIL Take 3 mLs (2.5 mg total) by nebulization every 6 (six) hours as needed for wheezing or shortness of breath.   aspirin EC 81 MG tablet Take 81 mg by mouth daily. Swallow whole.   Breztri Aerosphere 160-9-4.8 MCG/ACT Aero Generic drug: Budeson-Glycopyrrol-Formoterol Inhale 2 puffs into the lungs 2 (two) times daily.   calcitonin (salmon) 200 UNIT/ACT nasal spray Commonly known as: MIACALCIN/FORTICAL Place 1 spray into alternate nostrils daily.   cycloSPORINE 0.05 % ophthalmic emulsion Commonly known as: RESTASIS Place 1 drop into both eyes 2 (two) times daily.   ferrous sulfate 324 (65 Fe) MG Tbec Take 1 tablet (325 mg total) by mouth daily. OK to take every other day if it is constipating.   naloxone 4 MG/0.1ML Liqd nasal spray kit Commonly known as: NARCAN Place 1 spray into the nose once for 1 dose.   oxyCODONE 5 MG immediate release tablet Commonly known as: Oxy IR/ROXICODONE Take 1 tablet (5  mg total) by mouth every 6 (six) hours as needed for up to 7 days for moderate pain.   oxyCODONE 10 mg 12 hr tablet Commonly known as: OXYCONTIN Take 1 tablet (10 mg total) by mouth every 12 (twelve) hours as needed for up to 7 days.   OXYGEN Place 3 L into the nose continuous.   pantoprazole 40 MG  tablet Commonly known as: PROTONIX Take 1 tablet (40 mg total) by mouth daily.   roflumilast 500 MCG Tabs tablet Commonly known as: DALIRESP Take 1 tablet (500 mcg total) by mouth daily.   valACYclovir 1000 MG tablet Commonly known as: VALTREX TAKE 1 TABLET BY MOUTH TWICE DAILY FOR 3 DAYS AS NEEDED What changed:   how much to take  how to take this  when to take this  reasons to take this  additional instructions            Durable Medical Equipment  (From admission, onward)         Start     Ordered   08/16/20 0841  For home use only DME Shower stool  Once        08/16/20 0840          Discharge Instructions: Please refer to Patient Instructions section of EMR for full details.  Patient was counseled important signs and symptoms that should prompt return to medical care, changes in medications, dietary instructions, activity restrictions, and follow up appointments.   Follow-Up Appointments: F/U with neurosurgery Dr. Reatha Armour in 2-3 weeks.  Follow-up Information    Daisy Floro, DO. Go on 08/23/2020.   Specialty: Family Medicine Why: At 1:50 PM.  Please arrive by 1:35 PM Contact information: [address] [place] [address] [phone]        Dawley, Theodoro Doing, DO. Schedule an appointment as soon as possible for a visit in 3 week(s).   Why: Call to make an appointment for hospital follow up with the neurosurgeon.  Contact information: [address] [place] [address] [phone]               Armando Reichert, MD 08/16/2020, 2:18 PM PGY-1, [place] Upper-Level Resident Addendum I have discussed the above with the original author and agree with their documentation. My edits for correction/addition/clarification are included. Please see also any attending notes.   Wilber Oliphant, M.D.  PGY-3 08/16/2020 2:18 PM

## 2020-08-16 NOTE — TOC Transition Note (Signed)
Transition of Care (TOC) - CM/SW Discharge Note Marvetta Gibbons RN,BSN Transitions of Care Unit 4NP (non trauma) - RN Case Manager See Treatment Team for direct Phone #  Patient Details  Name: Mary Jenkins MRN: [id number] Date of Birth: November 27, 1955  Transition of Care [facility]) CM/SW Contact:  Dawayne Patricia, RN Phone Number: 08/16/2020, 3:36 PM   Clinical Narrative:    Pt stable for transition home today, orders place for DME and HH needs- CM in to speak with pt at bedside- address, phone # and PCP confirmed with pt in epic- pt does report she will need shower chair- transportation will be here shortly after lunch.  Discussed HH needs- list provided for [facility] choice Per CMS guidelines from medicare.gov website with star ratings (copy placed in shadow chart)- per pt she would like to use [facility] if possible if unable to accept then pt does not have a preference and will defer to this writer to secure an agency.  Call made to [employer]ME line for DME need-- shower chair to be delivered to room prior to discharge.   Call made to [facility] for [facility] referral- they are able to accept with a slight delay in start of care due to holiday- will try to start pt over the weekend on Sunday. Attempted to call pt to inform- however no answer and mailbox full.    Final next level of care: [place] Barriers to Discharge: Barriers Resolved   Patient Goals and CMS Choice Patient states their goals for this hospitalization and ongoing recovery are:: return home and do for self CMS Medicare.gov Compare Post Acute Care list provided to:: Patient Choice offered to / list presented to : Patient  Discharge Placement               home with [facility]        Discharge Plan and Services In-house Referral: Clinical Social Work Discharge Planning Services: CM Consult Post Acute Care Choice: Durable Medical Equipment, Home Health          DME Arranged: Shower stool DME Agency:  [facility] Date DME Agency Contacted: 08/16/20 Time DME Agency Contacted: 77 Representative spoke with at DME Agency: Adela Lank HH Arranged: PT, OT   Date [place]: 08/16/20      Social Determinants of Health (SDOH) Interventions     Readmission Risk Interventions No flowsheet data found.

## 2020-08-16 NOTE — Discharge Instructions (Addendum)
Dear Mary Jenkins,   Thank you for letting us participate in your care! In this section, you will find a brief hospital admission summary of why you were admitted to the hospital, what happened during your admission, your diagnosis/diagnoses, and recommended follow up.   You were admitted because you were experiencing thigh pain and back pain.  You were diagnosed a fracture (a break) in one of the bones in your spine.  The spine surgeons recommended TLSO back brace and pain medicaions.  The pain medications for your fracture include nasal calcitonin which she will spray 1 spray in alternate nostrils daily.  We also started you on OxyContin 10 mg every 12 hours and immediate release oxycodone 5 mg every 6 hours as needed for pain.  You were also seen by Neurosurgeon and physical therapy. They recommended TLSO brace and Home physical & occupational therapy.   Your symptoms improved and you were discharged from the hospital for meeting this goal.    POST-HOSPITAL & CARE INSTRUCTIONS 1. Continue wearing your brace daily. 2. Follow up with Neuro surgeon 2-3 weeks after Discharge. 3. Continue Home physical and occupational therapy 3 times per week 4. Follow up with Pulmonology for COPD. 5. Please let your primary care provider know of any changes that were made while you were hospitalized.  6. Please see medications section of this packet for any medication changes.   DOCTOR'S APPOINTMENT & FOLLOW UP CARE INSTRUCTIONS  Future Appointments  Date Time Provider [place]  12/18/2020 11:15 AM Tanda Rockers, MD [facility] None     Thank you for choosing [facility]! Take care and be well!  [facility]  [place], [place] [address] [phone]

## 2020-08-16 NOTE — Evaluation (Signed)
Occupational Therapy Evaluation Patient Details Name: Mary Jenkins MRN: [id number] DOB: April 04, 1956 Today's Date: 08/16/2020    History of Present Illness 64 yo female admitted to ED on 11/19 with L thigh pain and numbness, back pain x2 weeks. Chest CT - for acute PE, + T11 compression fracture. PMH includes breast cancer, COPD, cervical DDD, GERD.   Clinical Impression   This 64 y/o female presents with the above. PTA pt reports independence with household mobility tasks, daughter assisting with bathing ADL. Pt limited due to pain this AM (RN in to provide pain meds during session). She requires up to minA for bed mobility, modA for LB ADL. Further education provided during session on back precautions, brace wear, safety and compensatory techniques for completing ADL and functional transfers with pt verbalizing understanding. Pt reports plans to return home with daughter who can assist with ADL/iADL PRN. Pt to benefit from continued acute OT services and currently recommend follow up [facility] services after discharge to maximize her safety and independence with ADL and mobility.     Follow Up Recommendations  Home health OT;Supervision/Assistance - 24 hour    Equipment Recommendations  Tub/shower seat           Precautions / Restrictions Precautions Precautions: Fall;Back Precaution Booklet Issued: Yes (comment) Precaution Comments: back precautions reviewed for pt safety and comfort - BLT rules, log roll in and out of bed Required Braces or Orthoses: Spinal Brace Spinal Brace: Thoracolumbosacral orthotic;Other (comment) Spinal Brace Comments: for comfort, applied in sitting Restrictions Weight Bearing Restrictions: No      Mobility Bed Mobility Overal bed mobility: Needs Assistance Bed Mobility: Rolling;Sidelying to Sit;Sit to Sidelying Rolling: Supervision Sidelying to sit: Supervision     Sit to sidelying: Min assist General bed mobility comments: pt did require  light assist for LEs when returning to sidelying given back pain    Transfers                 General transfer comment: pt declined due to pain (RN gave pain meds during session), noted minguard for transfers yesterday    Balance Overall balance assessment: Needs assistance Sitting-balance support: No upper extremity supported;Feet supported Sitting balance-Leahy Scale: Good                                     ADL either performed or assessed with clinical judgement   ADL Overall ADL's : Needs assistance/impaired Eating/Feeding: Sitting;Set up   Grooming: Supervision/safety;Sitting   Upper Body Bathing: Supervision/ safety;Sitting   Lower Body Bathing: Moderate assistance;Sit to/from stand   Upper Body Dressing : Supervision/safety;Sitting   Lower Body Dressing: Moderate assistance;Sit to/from stand Lower Body Dressing Details (indicate cue type and reason): pt requests assist to don socks, however able to perform figure 4 when reviewing compensatory strategies - educated to use this method for LB tasks            Tub/Shower Transfer Details (indicate cue type and reason): educated in use of shower seat for bathing task for added safety and ease of task completion, pt initially stating plans to take a bath as she normally does, but in discussion pt agreeable that getting down into the tub would be difficult given her back pain. she is agreeable to use of DME for bathing task    General ADL Comments: much of session spent on review of safety and compensatory strategies for performing ADL while  incorporating back precautions                          Pertinent Vitals/Pain Pain Assessment: Faces Faces Pain Scale: Hurts whole lot Pain Location: back with certain movements Pain Descriptors / Indicators: Grimacing;Moaning;Sharp Pain Intervention(s): Monitored during session;RN gave pain meds during session;Repositioned     Hand Dominance Right    Extremity/Trunk Assessment Upper Extremity Assessment Upper Extremity Assessment: Overall WFL for tasks assessed   Lower Extremity Assessment Lower Extremity Assessment: Defer to PT evaluation   Cervical / Trunk Assessment Cervical / Trunk Assessment: Normal   Communication Communication Communication: No difficulties   Cognition Arousal/Alertness: Awake/alert Behavior During Therapy: WFL for tasks assessed/performed Overall Cognitive Status: Within Functional Limits for tasks assessed                                     General Comments       Exercises     Shoulder Instructions      Home Living Family/patient expects to be discharged to:: Private residence Living Arrangements: Children;Other relatives (sister) Available Help at Discharge: Family;Available 24 hours/day Type of Home: Apartment Home Access: Level entry     Home Layout: One level     Bathroom Shower/Tub: Teacher, early years/pre: Handicapped height     Home Equipment: Cane - single point;Bedside commode;Walker - 4 wheels          Prior Functioning/Environment Level of Independence: Needs assistance  Gait / Transfers Assistance Needed: Pt reports using rollator in community, does not use AD in the house ADL's / Homemaking Assistance Needed: Pt's daughter bathes and cooks for her, independent in dressing self with increased time. Pt does not drive            OT Problem List: Decreased strength;Decreased range of motion;Decreased activity tolerance;Impaired balance (sitting and/or standing);Pain;Decreased knowledge of precautions;Decreased knowledge of use of DME or AE      OT Treatment/Interventions: Self-care/ADL training;Therapeutic exercise;Energy conservation;DME and/or AE instruction;Therapeutic activities;Patient/family education;Balance training    OT Goals(Current goals can be found in the care plan section) Acute Rehab OT Goals Patient Stated Goal: decrease  back pain OT Goal Formulation: With patient Time For Goal Achievement: 08/30/20 Potential to Achieve Goals: Good  OT Frequency: Min 2X/week   Barriers to D/C:            Co-evaluation              AM-PAC OT "6 Clicks" Daily Activity     Outcome Measure Help from another person eating meals?: A Little Help from another person taking care of personal grooming?: A Little Help from another person toileting, which includes using toliet, bedpan, or urinal?: A Lot Help from another person bathing (including washing, rinsing, drying)?: A Lot Help from another person to put on and taking off regular upper body clothing?: A Little Help from another person to put on and taking off regular lower body clothing?: A Lot 6 Click Score: 15   End of Session Nurse Communication: Mobility status  Activity Tolerance: Patient limited by pain;Patient tolerated treatment well Patient left: in bed;with call bell/phone within reach  OT Visit Diagnosis: Other abnormalities of gait and mobility (R26.89);Pain Pain - part of body:  (back)                Time: 0762-2633 OT Time Calculation (min): 12  min Charges:  OT General Charges $OT Visit: 1 Visit OT Evaluation $OT Eval Low Complexity: [place], OT Acute Rehabilitation Services Pager [phone] Office [phone]   Raymondo Band 08/16/2020, 8:23 AM

## 2020-08-16 NOTE — Progress Notes (Signed)
The patient was evaluated by me this morning.   She seems to be doing well. Pain responded well to her current regimen, although it makes her feel nauseous.  As discussed with her, we will adjust her pain regimen prior to d/c home.  Need outpatient neurosurgery f/u in the next 2-3 weeks.  She is at her baseline from her COPD standpoint. Home regimen and outpatient pulm management  Likely d/c home today pending PT's recommendation.  I will cosign the resident's note once it is completed.

## 2020-08-16 NOTE — Care Management Important Message (Signed)
Important Message  Patient Details  Name: Mary Jenkins MRN: [id number] Date of Birth: 23-Jan-1956   Medicare Important Message Given:  Yes   Patient left prior to IM delivery . Document mailed to the patient address.   Arvine Clayburn 08/16/2020, 4:33 PM

## 2020-08-16 NOTE — Telephone Encounter (Signed)
Patient calls nurse line reporting she can not pick up the Oxycodone prescriptions sent in by our inpatient team. I called the pharmacy and they informed me Oxycodone 68m is on back order, however should be in soon. Oxycodone 13mis not being covered by her insurance, pharmacy informed me she could pay cash price or attempt a PA. I called the patient to inform, she said she was fine with waiting until next week for the PA process.

## 2020-08-16 NOTE — Progress Notes (Addendum)
Pt refused scheduled tylenol and Oxycontin stating that she was not in any pain and did not want to feel sick from them if she did not have any current pain.  Will continue to monitor patient's pain level

## 2020-08-16 NOTE — Progress Notes (Signed)
Pt and daughter given DC instructions and phone numbers to call for follow up apts. No questions or concerns verbalized by daughter or pt. Pt taken to vehicle via wc by ns. Katherina Right RN

## 2020-08-21 NOTE — Telephone Encounter (Signed)
I'm a little confused if I need to send this rx or not.  It looks like I don't need to.  Does that sound correct to you? -Collier Salina

## 2020-08-23 ENCOUNTER — Other Ambulatory Visit: Payer: Self-pay

## 2020-08-23 ENCOUNTER — Ambulatory Visit (INDEPENDENT_AMBULATORY_CARE_PROVIDER_SITE_OTHER): Payer: Medicare Other | Admitting: Family Medicine

## 2020-08-23 ENCOUNTER — Encounter: Payer: Self-pay | Admitting: Family Medicine

## 2020-08-23 VITALS — BP 100/65 | HR 128 | Ht 66.0 in

## 2020-08-23 DIAGNOSIS — S22000A Wedge compression fracture of unspecified thoracic vertebra, initial encounter for closed fracture: Secondary | ICD-10-CM | POA: Diagnosis not present

## 2020-08-23 DIAGNOSIS — D649 Anemia, unspecified: Secondary | ICD-10-CM | POA: Diagnosis not present

## 2020-08-23 NOTE — Patient Instructions (Addendum)
Thank you for coming in to see [place] today! Please see below to review our plan for today's visit:  1. Please make sure you follow up with your doctor on Monday, Dec 6th. 2. Please consider repeat colonoscopy if you have dark, tarry stools or if your blood levels decrease.    Please call the clinic at [phone] if your symptoms worsen or you have any concerns. It was our pleasure to serve you!   Dr. Milus Banister [facility]

## 2020-08-23 NOTE — Progress Notes (Signed)
SUBJECTIVE:   CHIEF COMPLAINT / HPI:   Hospital follow-up: Patient is a 64 year old female who presented to the emergency department 08/12/2020 with severe back pain for which she was admitted to the hospital. Work-up in the ED was significant for T11 compression fracture. Neurosurgery was consulted by emergency room provider and recommended TLSO brace, pain control, follow-up outpatient.  Patient was admitted for pain control and possible SNF placement.  Patient was evaluated by physical therapy and Occupational Therapy who recommended home health [facility] therapy and Occupational Therapy at discharge.  Her pain was ultimately well controlled on MS Contin 10 mg every 12 hours as well as oxycodone 5 mg as needed, and also started on nasal calcitonin.  She presents to the clinic today for follow-up.  Today Mary Jenkins reports that she is doing well. She reports that she has very little pain as long as she is in her brace, however when she takes it off it can be very painful.  She has been compliant with her nasal calcitonin.  She does not like to take MS Contin or oxycodone because the medications make her feel very drowsy and groggy.  She reports having follow-up with neurosurgery on Monday, 08/28/2020.   Mild Anemia: Patient has a history of mild anemia with normal iron studies, patient started on iron, asked to recheck in 6-8 weeks.  Repeat hemoglobin slightly improved from 10.2 in August to 11.3 in November.  Patient previously referred to GI for colonoscopy.  She reports she had a colonoscopy about 7 years ago, reports the results were normal and was asked to follow-up in 10 years.  She denies having any blood in her stools, or pitch black/tarry stools.  She denies having any vaginal bleeding.  I informed the patient that anemia could be a sign of cancer such as colon or uterine, and informed her that a colonoscopy could be the first step to investigating this possibility.  She respectfully declined,  informed me that she has had anemia for many years.  I instructed her that if she changes her mind to please let us know.   PERTINENT  PMH / PSH:  Anemia, cataracts, MDD, COPD, chronic respiratory failure with hypoxia on oxygen   OBJECTIVE:   BP 100/65   Pulse (!) 128   Ht _0  (1.676 m)   SpO2 100%   BMI 17.75 kg/m    Physical exam: General: Pleasant patient, no apparent distress, nontoxic-appearing Respiratory: Baseline work of breathing on supplemental oxygen while wearing TLSO brace Cardio: RRR, S1-S2 present, no murmurs appreciated, brisk capillary refill appreciated Neuro: Cranial nerves II-XII intact, sensation intact to bilateral upper and lower extremities, strength at baseline and symmetrical in bilateral upper and lower extremities, no focal neurological deficits appreciated   ASSESSMENT/PLAN:   Compression fracture of body of thoracic vertebra ([place]) -Patient instructed to take medications as she needs to for pain -Congratulated on her compliance with nasal calcitonin, encouraged further compliance -Patient to follow-up with neurosurgery Monday, 08/28/2020 -Can follow-up with [place] as needed  Anemia Patient with history of anemia of chronic disease, most recent iron level within normal limits.  Patient denies any active source of bleeding.  Continues to take oral ferrous sulfate with slight improvement of hemoglobin from 10.2 up to 11.3. -Encouraged to get repeat colonoscopy, patient declined.  Patient encouraged to reconsider, will continue to follow. -Encouraged to continue to take ferrous sulfate every other day     Daisy Floro, [facility]

## 2020-08-24 NOTE — Telephone Encounter (Signed)
Spoke with patient and she was able to pick up her pain prescriptions. Nothing further is needed from [place].

## 2020-08-25 NOTE — Telephone Encounter (Signed)
Called patient to follow-up. Patient was able to get prescriptions. Nothing further needed.

## 2020-08-27 NOTE — Assessment & Plan Note (Signed)
-  Patient instructed to take medications as she needs to for pain -Congratulated on her compliance with nasal calcitonin, encouraged further compliance -Patient to follow-up with neurosurgery Monday, 08/28/2020 -Can follow-up with [place] as needed

## 2020-08-27 NOTE — Assessment & Plan Note (Signed)
Patient with history of anemia of chronic disease, most recent iron level within normal limits.  Patient denies any active source of bleeding.  Continues to take oral ferrous sulfate with slight improvement of hemoglobin from 10.2 up to 11.3. -Encouraged to get repeat colonoscopy, patient declined.  Patient encouraged to reconsider, will continue to follow. -Encouraged to continue to take ferrous sulfate every other day

## 2020-09-09 ENCOUNTER — Emergency Department (HOSPITAL_COMMUNITY): Payer: Medicare Other

## 2020-09-09 ENCOUNTER — Inpatient Hospital Stay (HOSPITAL_COMMUNITY)
Admission: EM | Admit: 2020-09-09 | Discharge: 2020-09-15 | DRG: 190 | Disposition: A | Discharge status: Home Health | Payer: Medicare Other | Attending: Family Medicine | Admitting: Family Medicine

## 2020-09-09 DIAGNOSIS — D638 Anemia in other chronic diseases classified elsewhere: Secondary | ICD-10-CM | POA: Diagnosis present

## 2020-09-09 DIAGNOSIS — J9621 Acute and chronic respiratory failure with hypoxia: Secondary | ICD-10-CM | POA: Diagnosis present

## 2020-09-09 DIAGNOSIS — M8588 Other specified disorders of bone density and structure, other site: Secondary | ICD-10-CM | POA: Diagnosis present

## 2020-09-09 DIAGNOSIS — Z833 Family history of diabetes mellitus: Secondary | ICD-10-CM | POA: Diagnosis not present

## 2020-09-09 DIAGNOSIS — Z8049 Family history of malignant neoplasm of other genital organs: Secondary | ICD-10-CM

## 2020-09-09 DIAGNOSIS — Z79899 Other long term (current) drug therapy: Secondary | ICD-10-CM | POA: Diagnosis not present

## 2020-09-09 DIAGNOSIS — F419 Anxiety disorder, unspecified: Secondary | ICD-10-CM | POA: Diagnosis present

## 2020-09-09 DIAGNOSIS — K219 Gastro-esophageal reflux disease without esophagitis: Secondary | ICD-10-CM | POA: Diagnosis present

## 2020-09-09 DIAGNOSIS — Z7951 Long term (current) use of inhaled steroids: Secondary | ICD-10-CM

## 2020-09-09 DIAGNOSIS — R0602 Shortness of breath: Secondary | ICD-10-CM | POA: Diagnosis present

## 2020-09-09 DIAGNOSIS — Z7982 Long term (current) use of aspirin: Secondary | ICD-10-CM

## 2020-09-09 DIAGNOSIS — Z9011 Acquired absence of right breast and nipple: Secondary | ICD-10-CM

## 2020-09-09 DIAGNOSIS — Z823 Family history of stroke: Secondary | ICD-10-CM

## 2020-09-09 DIAGNOSIS — Z8249 Family history of ischemic heart disease and other diseases of the circulatory system: Secondary | ICD-10-CM

## 2020-09-09 DIAGNOSIS — M47812 Spondylosis without myelopathy or radiculopathy, cervical region: Secondary | ICD-10-CM | POA: Diagnosis present

## 2020-09-09 DIAGNOSIS — Z9981 Dependence on supplemental oxygen: Secondary | ICD-10-CM | POA: Diagnosis not present

## 2020-09-09 DIAGNOSIS — J441 Chronic obstructive pulmonary disease with (acute) exacerbation: Secondary | ICD-10-CM | POA: Diagnosis present

## 2020-09-09 DIAGNOSIS — Z87891 Personal history of nicotine dependence: Secondary | ICD-10-CM | POA: Diagnosis not present

## 2020-09-09 DIAGNOSIS — Z9221 Personal history of antineoplastic chemotherapy: Secondary | ICD-10-CM | POA: Diagnosis not present

## 2020-09-09 DIAGNOSIS — M8468XD Pathological fracture in other disease, other site, subsequent encounter for fracture with routine healing: Secondary | ICD-10-CM | POA: Diagnosis present

## 2020-09-09 DIAGNOSIS — J9601 Acute respiratory failure with hypoxia: Secondary | ICD-10-CM

## 2020-09-09 DIAGNOSIS — Z20822 Contact with and (suspected) exposure to covid-19: Secondary | ICD-10-CM | POA: Diagnosis present

## 2020-09-09 DIAGNOSIS — Z853 Personal history of malignant neoplasm of breast: Secondary | ICD-10-CM | POA: Diagnosis not present

## 2020-09-09 DIAGNOSIS — E43 Unspecified severe protein-calorie malnutrition: Secondary | ICD-10-CM | POA: Insufficient documentation

## 2020-09-09 LAB — CBC WITH DIFFERENTIAL/PLATELET
Abs Immature Granulocytes: 0.03 10*3/uL (ref 0.00–0.07)
Basophils Absolute: 0.1 10*3/uL (ref 0.0–0.1)
Basophils Relative: 1 %
Eosinophils Absolute: 0.1 10*3/uL (ref 0.0–0.5)
Eosinophils Relative: 2 %
HCT: 38.4 % (ref 36.0–46.0)
Hemoglobin: 11.3 g/dL — ABNORMAL LOW (ref 12.0–15.0)
Immature Granulocytes: 0 %
Lymphocytes Relative: 41 %
Lymphs Abs: 3.6 10*3/uL (ref 0.7–4.0)
MCH: 25.1 pg — ABNORMAL LOW (ref 26.0–34.0)
MCHC: 29.4 g/dL — ABNORMAL LOW (ref 30.0–36.0)
MCV: 85.1 fL (ref 80.0–100.0)
Monocytes Absolute: 0.7 10*3/uL (ref 0.1–1.0)
Monocytes Relative: 8 %
Neutro Abs: 4.3 10*3/uL (ref 1.7–7.7)
Neutrophils Relative %: 48 %
Platelets: 378 10*3/uL (ref 150–400)
RBC: 4.51 MIL/uL (ref 3.87–5.11)
RDW: 16.3 % — ABNORMAL HIGH (ref 11.5–15.5)
WBC: 8.8 10*3/uL (ref 4.0–10.5)
nRBC: 0 % (ref 0.0–0.2)

## 2020-09-09 LAB — COMPREHENSIVE METABOLIC PANEL
ALT: 13 U/L (ref 0–44)
AST: 23 U/L (ref 15–41)
Albumin: 3.6 g/dL (ref 3.5–5.0)
Alkaline Phosphatase: 68 U/L (ref 38–126)
Anion gap: 13 (ref 5–15)
BUN: 10 mg/dL (ref 8–23)
CO2: 23 mmol/L (ref 22–32)
Calcium: 9.3 mg/dL (ref 8.9–10.3)
Chloride: 102 mmol/L (ref 98–111)
Creatinine, Ser: 0.89 mg/dL (ref 0.44–1.00)
GFR, Estimated: >60 mL/min (ref >60)
Glucose, Bld: 218 mg/dL — ABNORMAL HIGH (ref 70–99)
Potassium: 4.1 mmol/L (ref 3.5–5.1)
Sodium: 138 mmol/L (ref 135–145)
Total Bilirubin: 0.6 mg/dL (ref 0.3–1.2)
Total Protein: 6.8 g/dL (ref 6.5–8.1)

## 2020-09-09 LAB — RESP PANEL BY RT-PCR (FLU A&B, COVID) ARPGX2
Influenza A by PCR: NEGATIVE
Influenza B by PCR: NEGATIVE
SARS Coronavirus 2 by RT PCR: NEGATIVE

## 2020-09-09 LAB — LACTIC ACID, PLASMA
Lactic Acid, Venous: 2.8 mmol/L (ref 0.5–1.9)
Lactic Acid, Venous: 2.9 mmol/L (ref 0.5–1.9)

## 2020-09-09 LAB — PROTIME-INR
INR: 0.9 (ref 0.8–1.2)
Prothrombin Time: 11.9 seconds (ref 11.4–15.2)

## 2020-09-09 LAB — TROPONIN I (HIGH SENSITIVITY)
Troponin I (High Sensitivity): 4 ng/L (ref <18)
Troponin I (High Sensitivity): 5 ng/L (ref <18)

## 2020-09-09 LAB — BRAIN NATRIURETIC PEPTIDE: B Natriuretic Peptide: 21.9 pg/mL (ref 0.0–100.0)

## 2020-09-09 MED ORDER — PREDNISONE 20 MG PO TABS
40.0000 mg | ORAL_TABLET | Freq: Every day | ORAL | Status: DC
  Administered 2020-09-10: 09:00:00 40 mg via ORAL
  Filled 2020-09-09: qty 2

## 2020-09-09 MED ORDER — PANTOPRAZOLE SODIUM 40 MG PO TBEC
40.0000 mg | DELAYED_RELEASE_TABLET | Freq: Every day | ORAL | Status: DC
  Administered 2020-09-10 – 2020-09-15 (×6): 40 mg via ORAL
  Filled 2020-09-09 (×6): qty 1

## 2020-09-09 MED ORDER — LACTATED RINGERS IV BOLUS
500.0000 mL | Freq: Once | INTRAVENOUS | Status: AC
  Administered 2020-09-09: 19:00:00 500 mL via INTRAVENOUS

## 2020-09-09 MED ORDER — ROFLUMILAST 500 MCG PO TABS
500.0000 ug | ORAL_TABLET | Freq: Every day | ORAL | Status: DC
  Administered 2020-09-10 – 2020-09-15 (×6): 500 ug via ORAL
  Filled 2020-09-09 (×6): qty 1

## 2020-09-09 MED ORDER — CYCLOSPORINE 0.05 % OP EMUL
1.0000 [drp] | Freq: Two times a day (BID) | OPHTHALMIC | Status: DC
  Administered 2020-09-10 – 2020-09-15 (×12): 1 [drp] via OPHTHALMIC
  Filled 2020-09-09 (×14): qty 1

## 2020-09-09 MED ORDER — ONDANSETRON HCL 4 MG/2ML IJ SOLN
4.0000 mg | Freq: Once | INTRAMUSCULAR | Status: AC
  Administered 2020-09-09: 19:00:00 4 mg via INTRAVENOUS
  Filled 2020-09-09: qty 2

## 2020-09-09 MED ORDER — UMECLIDINIUM BROMIDE 62.5 MCG/INH IN AEPB
1.0000 | INHALATION_SPRAY | Freq: Every day | RESPIRATORY_TRACT | Status: DC
  Administered 2020-09-11 – 2020-09-15 (×5): 1 via RESPIRATORY_TRACT
  Filled 2020-09-09 (×2): qty 7

## 2020-09-09 MED ORDER — CALCITONIN (SALMON) 200 UNIT/ACT NA SOLN
1.0000 | Freq: Every day | NASAL | Status: DC
  Administered 2020-09-10 – 2020-09-15 (×6): 1 via NASAL
  Filled 2020-09-09 (×2): qty 3.7

## 2020-09-09 MED ORDER — ALBUTEROL SULFATE HFA 108 (90 BASE) MCG/ACT IN AERS: 2.0000 | INHALATION_SPRAY | Freq: Four times a day (QID) | RESPIRATORY_TRACT | Status: DC | PRN

## 2020-09-09 MED ORDER — ROFLUMILAST 500 MCG PO TABS: 500.0000 ug | ORAL_TABLET | Freq: Every day | ORAL | Status: DC

## 2020-09-09 MED ORDER — FLUTICASONE FUROATE-VILANTEROL 200-25 MCG/INH IN AEPB
1.0000 | INHALATION_SPRAY | Freq: Every day | RESPIRATORY_TRACT | Status: DC
  Administered 2020-09-11 – 2020-09-15 (×5): 1 via RESPIRATORY_TRACT
  Filled 2020-09-09 (×2): qty 28

## 2020-09-09 MED ORDER — BUDESON-GLYCOPYRROL-FORMOTEROL 160-9-4.8 MCG/ACT IN AERO: 2.0000 | INHALATION_SPRAY | Freq: Two times a day (BID) | RESPIRATORY_TRACT | Status: DC

## 2020-09-09 MED ORDER — ASPIRIN EC 81 MG PO TBEC
81.0000 mg | DELAYED_RELEASE_TABLET | Freq: Every day | ORAL | Status: DC
  Administered 2020-09-10 – 2020-09-15 (×6): 81 mg via ORAL
  Filled 2020-09-09 (×6): qty 1

## 2020-09-09 MED ORDER — LORAZEPAM 2 MG/ML IJ SOLN
1.0000 mg | Freq: Once | INTRAMUSCULAR | Status: AC
  Administered 2020-09-09: 19:00:00 1 mg via INTRAVENOUS

## 2020-09-09 MED ORDER — ENOXAPARIN SODIUM 40 MG/0.4ML ~~LOC~~ SOLN
40.0000 mg | SUBCUTANEOUS | Status: DC
  Administered 2020-09-10 – 2020-09-15 (×7): 40 mg via SUBCUTANEOUS
  Filled 2020-09-09 (×11): qty 0.4

## 2020-09-09 MED ORDER — LORAZEPAM 2 MG/ML IJ SOLN
INTRAMUSCULAR | Status: AC
  Filled 2020-09-09: qty 1

## 2020-09-09 MED ORDER — ALBUTEROL SULFATE (2.5 MG/3ML) 0.083% IN NEBU: 2.5000 mg | INHALATION_SOLUTION | Freq: Four times a day (QID) | RESPIRATORY_TRACT | Status: DC | PRN

## 2020-09-09 MED ORDER — MAGNESIUM SULFATE 2 GM/50ML IV SOLN
2.0000 g | Freq: Once | INTRAVENOUS | Status: AC
  Administered 2020-09-09: 19:00:00 2 g via INTRAVENOUS

## 2020-09-09 MED ORDER — ALBUTEROL (5 MG/ML) CONTINUOUS INHALATION SOLN
10.0000 mg/h | INHALATION_SOLUTION | Freq: Once | RESPIRATORY_TRACT | Status: AC
  Administered 2020-09-09: 19:00:00 10 mg/h via RESPIRATORY_TRACT
  Filled 2020-09-09: qty 20

## 2020-09-09 MED ORDER — IPRATROPIUM-ALBUTEROL 0.5-2.5 (3) MG/3ML IN SOLN
3.0000 mL | Freq: Once | RESPIRATORY_TRACT | Status: AC
  Administered 2020-09-09: 19:00:00 3 mL via RESPIRATORY_TRACT
  Filled 2020-09-09: qty 3

## 2020-09-09 MED ORDER — SODIUM CHLORIDE 0.9 % IV SOLN
1.0000 g | INTRAVENOUS | Status: DC
  Administered 2020-09-09 – 2020-09-11 (×3): 1 g via INTRAVENOUS
  Filled 2020-09-09 (×3): qty 10

## 2020-09-09 MED ORDER — FERROUS SULFATE 325 (65 FE) MG PO TABS
325.0000 mg | ORAL_TABLET | Freq: Every day | ORAL | Status: DC
  Administered 2020-09-10 – 2020-09-15 (×6): 325 mg via ORAL
  Filled 2020-09-09 (×6): qty 1

## 2020-09-09 NOTE — ED Triage Notes (Signed)
Pt bib ems from home in Resp Distress. Pt with hx COPD. On scene pt with RR 50's, with obvious work of breathing. Lungs diminished in all lobes. Pt arrives on CPAP with oxygen 96%. BP 150/100. Given 20m albuterol, 0.556matrovent and 12560molumedrol.

## 2020-09-09 NOTE — ED Notes (Signed)
Paged Attending for RN

## 2020-09-09 NOTE — H&P (Signed)
[facility] Admission History and Physical Service Pager: [phone]  Patient name: Mary Jenkins Medical record number: [id number] Date of birth: [birth year] Age: 64 y.o. Gender: female  Primary Care Provider: Matilde Haymaker, MD Consultants: none Code Status: full Preferred Emergency Contact: daughter [name] [phone]  Chief Complaint: COPD exacerbation  Assessment and Plan: Mary Jenkins is a 64 y.o. female with PMH significant for COPD GOLD IV, Anemia, MDD, GERD, H/O breast Cancer, h/o thoracic vertebral compression fx, Cervical spine arthritis and Cataract.  Acute respiratory distress on COPD GOLD IV- given history of phlegm change, likely exacerbation of COPD. Less likely pneumonia given no active disease on cxray or fever. However, LA is elevated 2.8>2.9. Considering PE, but wells score is low risk and no signs of DVT. Followed by pulmonology outpatient. Home medications include: albuterol, breztri 2 puffs BID, daliresp 500 mg daily. Pt baseline 3L of Home O2. Pt is a former smoker. respiratory panel neg for flu or covid. Patient feels much improved on Bipap.  - admit to progressive, attending Dr. Andria Frames - D-dimer as negative predictor, cta chest if grossly positive - repeat LA - maintenance fluids - Bipap PRN and wean as tolerated -Maintain O2 saturation 88-92% - continue prednisone - CTX -Continue albuterol inhaler and nebulization solution. -Continue breztri - continuous pulse ox -PT/OT eval and treat - reach out to FYI her pulmonologist am/Mon  Vertebral compression fx- associated with osteopenia. Dx on recent admission 07/2020 and still undergoing pain treatment and TSLO brace. Following up with neurosurgery OP. - continue salmon calcitonin nasal spray as respiratory status will allow - tylenol PRN - tramadol PRN - incentive spirometer   Normocytic Anemia- chronic. stable. hgb 11.3 on admission. Likely anemia of chronic  disease. Pt is on ferrous sulfate 325 mg daily. -Continue iron 325 mg daily.  GERD- chronic, stable. home meds: Protonix 40 mg daily. -Continue Protonix  H/o Breast cancer- with  Patient has history of right-sided breast cancer 23 years ago. She underwent a unilateral mastectomy with breast reconstruction and 6 months of chemotherapy.  She no longer sees an oncologist  FEN/GI: Normal diet Prophylaxis: Enoxaparin  Disposition: pending respiratory improvement  History of Present Illness:  Mary Jenkins is a 64 y.o. female presenting with acute worsening of shortness of breath.  Mary Jenkins has a baseline use of 3 L nasal cannula.  Approximately 2 weeks ago she noticed that she was having a harder time breathing and increased her oxygen to 4 L which has maintained her oxygen saturations and resolved her symptoms of dyspnea.  She was still able to participate in physical therapy without any limitations during this time.  However, patient has had an acute worsening for the past 2 days and had troubles taking deep breaths or exerting herself.  She has been adherent with her home breathing treatments.  Denies any loss of consciousness or syncope.  She states that her cough has not changed from baseline but she has increased amount of sputum which is similar in character to her usual and feels sinus congestion.  No other sick symptoms such as runny nose, sore throat, nausea, vomiting, diarrhea, abdominal pain.  Denies fevers or rashes.  She has back pain from recent thoracic vertebrae compression fracture which may be impacting her ability to take deep breaths as well.  Her last appointment with her pulmonologist was September and follows up with them in March 2022. Patient takes ASA 81 mg daily.  She denies any lower extremity swelling  or redness and does not have any episodes of prolonged travel and her recent history however, states that she is sedentary at her baseline.  Review Of Systems:  Per HPI with the following additions:  Review of Systems  Constitutional: Positive for activity change. Negative for diaphoresis and fever.  HENT: Positive for congestion. Negative for rhinorrhea and sore throat.   Respiratory: Positive for cough, chest tightness, shortness of breath and wheezing.   Cardiovascular: Positive for chest pain.  Gastrointestinal: Negative for diarrhea and nausea.  Musculoskeletal: Positive for back pain.  Neurological: Negative for dizziness and syncope.     Patient Active Problem List   Diagnosis Date Noted  . Compression fracture of body of thoracic vertebra ([place]) 08/12/2020  . Back pain 08/12/2020  . Pain   . Chronic obstructive pulmonary disease ([place])   . Palliative care by specialist   . Goals of care, counseling/discussion   . Dysphagia 04/23/2020  . Acute respiratory distress 04/22/2020  . Chills (without fever) 02/23/2020  . Shortness of breath 12/27/2019  . Screening for hyperlipidemia 12/03/2019  . Bruising 07/21/2019  . Healthcare maintenance 05/23/2018  . Chronic respiratory failure with hypoxia and hypercapnia ([place]) 04/13/2018  . Weight loss 08/02/2017  . Severe episode of recurrent major depressive disorder, without psychotic features ([place]) 04/25/2017  . Encounter for HCV screening test for low risk patient 03/10/2017  . History of hyperkalemia 02/13/2016  . Skin lesion of back 11/15/2014  . Atypical chest pain 10/21/2014  . Cataract 10/21/2014  . Dyslipidemia, goal to be determined 04/22/2014  . Lipoma 10/07/2012  . Cervical spine arthritis 08/27/2012  . History of breast cancer 07/23/2012  . GERD (gastroesophageal reflux disease) 02/03/2012  . Anemia 07/29/2011  . COPD GOLD IV 06/24/2011  . Liver cyst 06/24/2011  . Insomnia 06/24/2011    Past Medical History: Past Medical History:  Diagnosis Date  . Breast cancer ([place])   . Cataract, immature    bilateral  . COPD (chronic obstructive pulmonary disease) ([place])    no home  O2  . Cough 02/09/2015  . DDD (degenerative disc disease), cervical   . Exertional shortness of breath    states if she "takes her time" doing activities does not get SOB  . Family history of adverse reaction to anesthesia    pt's sister has hx. of post-op N/V  . Full dentures   . GERD (gastroesophageal reflux disease) 02/03/2012  . High cholesterol    no current med.  Marland Kitchen History of breast cancer   . Personal history of chemotherapy 1997  . Runny nose 02/09/2015   clear drainage, per pt.    Past Surgical History: Past Surgical History:  Procedure Laterality Date  . BREAST CAPSULOTOMY WITH IMPLANT EXCHANGE Right 06/21/2013   Procedure: REVISION RIGHT BREAST RECONSTRUCTION/REMOVAL OF RIGHT IMPLANT/RIGHT BREAST CAPSULOTOMY WITH INSERT TISSUE EXPLANDER RIGHT BREAST/POSSIBLE LEFT BREAST MASTOPEXY;  Surgeon: Cristine Polio, MD;  Location: [place];  Service: Plastics;  Laterality: Right;  . BREAST RECONSTRUCTION Right 06/21/2013   Procedure: BREAST RECONSTRUCTION;  Surgeon: Cristine Polio, MD;  Location: [place];  Service: Plastics;  Laterality: Right;  . BREAST RECONSTRUCTION Left 11/29/2013   Procedure: LEFT MASTOPEXY FOR RECONSTRUCTION;  Surgeon: Cristine Polio, MD;  Location: [place];  Service: Plastics;  Laterality: Left;  . BREAST RECONSTRUCTION Right 03/14/2014   Procedure: RECONSTRUCTION NIPPLE RIGHT BREAST;  Surgeon: Cristine Polio, MD;  Location: [place];  Service: Plastics;  Laterality: Right;  . BREAST  SURGERY  97   implant  . CAPSULOTOMY Right 06/30/2017   Procedure: CAPSULOTOMY;  Surgeon: Cristine Polio, MD;  Location: [place];  Service: Plastics;  Laterality: Right;  . COLONOSCOPY    . ESOPHAGOGASTRODUODENOSCOPY (EGD) WITH PROPOFOL N/A 04/25/2020   Procedure: ESOPHAGOGASTRODUODENOSCOPY (EGD) WITH PROPOFOL;  Surgeon: Milus Banister, MD;  [facility] ENDOSCOPY;  Service: Endoscopy;   Laterality: N/A;  . MASTECTOMY, PARTIAL Right 1997   -node dissection  . REDUCTION MAMMAPLASTY Left   . REMOVAL OF TISSUE EXPANDER AND PLACEMENT OF IMPLANT Right 02/13/2015   Procedure: REMOVAL OF TISSUE  EXPANDER  PORT RIGHT BREAST;  Surgeon: Cristine Polio, MD;  Location: [place];  Service: Plastics;  Laterality: Right;  . REMOVAL OF TISSUE EXPANDER AND PLACEMENT OF IMPLANT Right 06/30/2017   Procedure: REMOVAL OF RIGHT TISSUE EXPANDER WITH PLACEMENT OF RIGHT GELL BREAST IMPLANTS;  Surgeon: Cristine Polio, MD;  Location: [place];  Service: Plastics;  Laterality: Right;  . SCAR REVISION Right 02/13/2015   Procedure: SCAR REVISION RIGHT BREAST;  Surgeon: Cristine Polio, MD;  Location: [place];  Service: Plastics;  Laterality: Right;  . TISSUE EXPANDER PLACEMENT Right 03/14/2014   Procedure: SALINE REMOVAL RIGHT TISSUE EXPANDER;  Surgeon: Cristine Polio, MD;  Location: [place];  Service: Plastics;  Laterality: Right;  . TUBAL LIGATION  1980's    Social History: Social History   Tobacco Use  . Smoking status: Former Smoker    Quit date: 07/23/2012    Years since quitting: 8.1  . Smokeless tobacco: Never Used  Vaping Use  . Vaping Use: Never used  Substance Use Topics  . Alcohol use: No    Alcohol/week: 0.0 standard drinks    Comment: one beer every now and then  . Drug use: No   Additional social history:  Please also refer to relevant sections of EMR.  Family History: Family History  Problem Relation Age of Onset  . Stroke Mother   . Heart disease Brother   . Heart disease Brother   . Anesthesia problems Sister        post-op N/V  . Cancer Sister        Endometrial CA  . Heart disease Brother   . Tuberculosis Daughter        not active  . Allergic rhinitis Daughter   . Allergic rhinitis Son   . Hypertension Sister   . Diabetes Sister   . Breast cancer Neg Hx     Allergies and  Medications: No Known Allergies No current facility-administered medications on file prior to encounter.   Current Outpatient Medications on File Prior to Encounter  Medication Sig Dispense Refill  . albuterol (PROAIR HFA) 108 (90 Base) MCG/ACT inhaler Inhale 2 puffs into the lungs every 6 (six) hours as needed for wheezing or shortness of breath. 18 g 3  . albuterol (PROVENTIL) (2.5 MG/3ML) 0.083% nebulizer solution Take 3 mLs (2.5 mg total) by nebulization every 6 (six) hours as needed for wheezing or shortness of breath. 75 mL 6  . aspirin EC 81 MG tablet Take 81 mg by mouth daily. Swallow whole.    . Budeson-Glycopyrrol-Formoterol (BREZTRI AEROSPHERE) 160-9-4.8 MCG/ACT AERO Inhale 2 puffs into the lungs 2 (two) times daily. 10.7 g 0  . calcitonin, salmon, (MIACALCIN/FORTICAL) 200 UNIT/ACT nasal spray Place 1 spray into alternate nostrils daily. 3.7 mL 0  . cycloSPORINE (RESTASIS) 0.05 % ophthalmic emulsion Place 1 drop into both eyes 2 (  two) times daily.     . ferrous sulfate 324 (65 Fe) MG TBEC Take 1 tablet (325 mg total) by mouth daily. OK to take every other day if it is constipating. 60 tablet 1  . OXYGEN Place 3 L into the nose continuous.     . pantoprazole (PROTONIX) 40 MG tablet Take 1 tablet (40 mg total) by mouth daily. 30 tablet 6  . roflumilast (DALIRESP) 500 MCG TABS tablet Take 1 tablet (500 mcg total) by mouth daily. 30 tablet 11  . valACYclovir (VALTREX) 1000 MG tablet TAKE 1 TABLET BY MOUTH TWICE DAILY FOR 3 DAYS AS NEEDED (Patient taking differently: Take 1,000 mg by mouth 2 (two) times daily as needed (for flare up). ) 30 tablet 5    Objective: BP 104/64   Pulse 99   Temp (!) 97 F (36.1 C) (Temporal)   Resp 15   SpO2 100%  Exam: General: Resting in bed with BiPAP on Eyes: PERRL ENTM: Negative nasal drainage Neck: Negative cervical lymphadenitis or tenderness to palpation Cardiovascular: RRR, no murmur appreciated Respiratory: Increased work of breathing,  currently on BiPAP and able to speak in full sentences, scattered wheezes throughout with good air movement to bases Gastrointestinal: Negative for tenderness to palpation, soft, nondistended MSK: Thin build, no lower extremity edema Derm: No rashes or lesions Neuro: Alert and oriented x4, no focal deficits Psych: Normal affect and mood  Labs and Imaging: CBC BMET  Recent Labs  Lab 09/09/20 1838  WBC 8.8  HGB 11.3*  HCT 38.4  PLT 378   Recent Labs  Lab 09/09/20 1838  NA 138  K 4.1  CL 102  CO2 23  BUN 10  CREATININE 0.89  GLUCOSE 218*  CALCIUM 9.3     EKG: Sinus tachycardia  DG Chest Port 1 View  Result Date: 09/09/2020 CLINICAL DATA:  Respiratory failure.  COPD. EXAM: PORTABLE CHEST 1 VIEW COMPARISON:  08/12/2020 chest radiograph. FINDINGS: Surgical clips overlie the right axilla. Right breast prosthesis. Stable cardiomediastinal silhouette with normal heart size. No pneumothorax. No pleural effusion. Hyperinflated lungs and severe emphysema. No pulmonary edema. No acute consolidative airspace disease. IMPRESSION: Hyperinflated lungs and severe emphysema, compatible with reported COPD. No acute cardiopulmonary disease. Electronically Signed   By: Ilona Sorrel M.D.   On: 09/09/2020 18:58    Richarda Osmond, DO 09/09/2020, 9:02 PM PGY-3, [place] [facility] pager: [phone], text pages welcome

## 2020-09-09 NOTE — ED Notes (Signed)
Called main lab, stated that they have not received repeat troponin and repeat lactic acid. Lab stated that previous lab tech reported to this current lab tech that there were tube station problems and that several labs were not received from the ED. These labs were drawn by this tech and sent down at 2037.

## 2020-09-09 NOTE — Progress Notes (Signed)
RT took pt off of BIPAP and placed her on 4 LPM (home regimen). Pt states she is feeling better. Pt able to expectorate moderate amount of clear thick sputum. Pt tolerating well. RT will continue to monitor.

## 2020-09-09 NOTE — ED Provider Notes (Signed)
[facility] EMERGENCY DEPARTMENT Provider Note   CSN: [id number] Arrival date & time: 09/09/20  1824     History Chief Complaint  Patient presents with  . Respiratory Distress    Mary Jenkins is a 64 y.o. female past medical history of COPD on 4 L chronic nasal cannula, presenting to the ED via EMS with acute respiratory failure.  Patient was noted to be in respiratory distress on EMS arrival, with respiratory rate 55, increased work of breathing.  She was trialed on CPAP, however was too fatigued and placed on BiPAP with improved sats.  Heart rates in 140s on arrival.  She was given 0.5 of ipratropium and 5 mg of albuterol, 125 of Solu-Medrol prior to arrival by EMS.  Level 5 caveat secondary to acute respiratory distress.   The history is provided by the EMS personnel.       Past Medical History:  Diagnosis Date  . Breast cancer ([place])   . Cataract, immature    bilateral  . COPD (chronic obstructive pulmonary disease) ([place])    no home O2  . Cough 02/09/2015  . DDD (degenerative disc disease), cervical   . Exertional shortness of breath    states if she "takes her time" doing activities does not get SOB  . Family history of adverse reaction to anesthesia    pt's sister has hx. of post-op N/V  . Full dentures   . GERD (gastroesophageal reflux disease) 02/03/2012  . High cholesterol    no current med.  Marland Kitchen History of breast cancer   . Personal history of chemotherapy 1997  . Runny nose 02/09/2015   clear drainage, per pt.    Patient Active Problem List   Diagnosis Date Noted  . SOB (shortness of breath) 09/09/2020  . Compression fracture of body of thoracic vertebra ([place]) 08/12/2020  . Back pain 08/12/2020  . Pain   . Chronic obstructive pulmonary disease ([place])   . Palliative care by specialist   . Goals of care, counseling/discussion   . Dysphagia 04/23/2020  . Acute respiratory distress 04/22/2020  . Chills (without fever) 02/23/2020  .  Shortness of breath 12/27/2019  . Screening for hyperlipidemia 12/03/2019  . Bruising 07/21/2019  . Healthcare maintenance 05/23/2018  . Chronic respiratory failure with hypoxia and hypercapnia ([place]) 04/13/2018  . Weight loss 08/02/2017  . Severe episode of recurrent major depressive disorder, without psychotic features ([place]) 04/25/2017  . Encounter for HCV screening test for low risk patient 03/10/2017  . History of hyperkalemia 02/13/2016  . Skin lesion of back 11/15/2014  . Atypical chest pain 10/21/2014  . Cataract 10/21/2014  . Dyslipidemia, goal to be determined 04/22/2014  . Lipoma 10/07/2012  . Cervical spine arthritis 08/27/2012  . History of breast cancer 07/23/2012  . GERD (gastroesophageal reflux disease) 02/03/2012  . Anemia 07/29/2011  . COPD GOLD IV 06/24/2011  . Liver cyst 06/24/2011  . Insomnia 06/24/2011    Past Surgical History:  Procedure Laterality Date  . BREAST CAPSULOTOMY WITH IMPLANT EXCHANGE Right 06/21/2013   Procedure: REVISION RIGHT BREAST RECONSTRUCTION/REMOVAL OF RIGHT IMPLANT/RIGHT BREAST CAPSULOTOMY WITH INSERT TISSUE EXPLANDER RIGHT BREAST/POSSIBLE LEFT BREAST MASTOPEXY;  Surgeon: Cristine Polio, MD;  Location: [facility];  Service: Plastics;  Laterality: Right;  . BREAST RECONSTRUCTION Right 06/21/2013   Procedure: BREAST RECONSTRUCTION;  Surgeon: Cristine Polio, MD;  Location: [facility];  Service: Plastics;  Laterality: Right;  . BREAST RECONSTRUCTION Left 11/29/2013   Procedure: LEFT MASTOPEXY FOR  RECONSTRUCTION;  Surgeon: Cristine Polio, MD;  Location: [facility];  Service: Plastics;  Laterality: Left;  . BREAST RECONSTRUCTION Right 03/14/2014   Procedure: RECONSTRUCTION NIPPLE RIGHT BREAST;  Surgeon: Cristine Polio, MD;  Location: [facility];  Service: Plastics;  Laterality: Right;  . BREAST SURGERY  97   implant  . CAPSULOTOMY Right 06/30/2017   Procedure: CAPSULOTOMY;  Surgeon:  Cristine Polio, MD;  Location: [facility];  Service: Plastics;  Laterality: Right;  . COLONOSCOPY    . ESOPHAGOGASTRODUODENOSCOPY (EGD) WITH PROPOFOL N/A 04/25/2020   Procedure: ESOPHAGOGASTRODUODENOSCOPY (EGD) WITH PROPOFOL;  Surgeon: Milus Banister, MD;  Location: [facility] ENDOSCOPY;  Service: Endoscopy;  Laterality: N/A;  . MASTECTOMY, PARTIAL Right 1997   -node dissection  . REDUCTION MAMMAPLASTY Left   . REMOVAL OF TISSUE EXPANDER AND PLACEMENT OF IMPLANT Right 02/13/2015   Procedure: REMOVAL OF TISSUE  EXPANDER  PORT RIGHT BREAST;  Surgeon: Cristine Polio, MD;  Location: [facility];  Service: Plastics;  Laterality: Right;  . REMOVAL OF TISSUE EXPANDER AND PLACEMENT OF IMPLANT Right 06/30/2017   Procedure: REMOVAL OF RIGHT TISSUE EXPANDER WITH PLACEMENT OF RIGHT GELL BREAST IMPLANTS;  Surgeon: Cristine Polio, MD;  Location: [facility];  Service: Plastics;  Laterality: Right;  . SCAR REVISION Right 02/13/2015   Procedure: SCAR REVISION RIGHT BREAST;  Surgeon: Cristine Polio, MD;  Location: [facility];  Service: Plastics;  Laterality: Right;  . TISSUE EXPANDER PLACEMENT Right 03/14/2014   Procedure: SALINE REMOVAL RIGHT TISSUE EXPANDER;  Surgeon: Cristine Polio, MD;  Location: [facility];  Service: Plastics;  Laterality: Right;  . TUBAL LIGATION  1980's     OB History   No obstetric history on file.     Family History  Problem Relation Age of Onset  . Stroke Mother   . Heart disease Brother   . Heart disease Brother   . Anesthesia problems Sister        post-op N/V  . Cancer Sister        Endometrial CA  . Heart disease Brother   . Tuberculosis Daughter        not active  . Allergic rhinitis Daughter   . Allergic rhinitis Son   . Hypertension Sister   . Diabetes Sister   . Breast cancer Neg Hx     Social History   Tobacco Use  . Smoking status: Former Smoker    Quit date: 07/23/2012    Years  since quitting: 8.1  . Smokeless tobacco: Never Used  Vaping Use  . Vaping Use: Never used  Substance Use Topics  . Alcohol use: No    Alcohol/week: 0.0 standard drinks    Comment: one beer every now and then  . Drug use: No    Home Medications Prior to Admission medications   Medication Sig Start Date End Date Taking? Authorizing Provider  albuterol (PROAIR HFA) 108 (90 Base) MCG/ACT inhaler Inhale 2 puffs into the lungs every 6 (six) hours as needed for wheezing or shortness of breath. 03/06/20  Yes Matilde Haymaker, MD  albuterol (PROVENTIL) (2.5 MG/3ML) 0.083% nebulizer solution Take 3 mLs (2.5 mg total) by nebulization every 6 (six) hours as needed for wheezing or shortness of breath. 05/03/20  Yes Martyn Ehrich, NP  aspirin EC 81 MG tablet Take 81 mg by mouth daily. Swallow whole.   Yes [provider]  Budeson-Glycopyrrol-Formoterol (BREZTRI AEROSPHERE) 160-9-4.8 MCG/ACT AERO Inhale 2 puffs into  the lungs 2 (two) times daily. 12/28/19  Yes Mullis, Kiersten P, DO  calcitonin, salmon, (MIACALCIN/FORTICAL) 200 UNIT/ACT nasal spray Place 1 spray into alternate nostrils daily. 08/16/20  Yes Ezequiel Essex, MD  cycloSPORINE (RESTASIS) 0.05 % ophthalmic emulsion Place 1 drop into both eyes 2 (two) times daily.    Yes [provider]  ferrous sulfate 324 (65 Fe) MG TBEC Take 1 tablet (325 mg total) by mouth daily. OK to take every other day if it is constipating. Patient taking differently: Take 1 tablet by mouth every other day. 05/15/20  Yes Matilde Haymaker, MD  naloxone  [facility]) 4 MG/0.1ML LIQD nasal spray kit Place 1 spray into the nose once as needed (opioid overdose).   Yes [provider]  oxyCODONE (OXY IR/ROXICODONE) 5 MG immediate release tablet Take 5 mg by mouth every 4 (four) hours as needed for severe pain.   Yes [provider]  OXYGEN Place 4 L into the nose continuous.   Yes [provider]  pantoprazole (PROTONIX) 40 MG tablet Take 1  tablet (40 mg total) by mouth daily. 05/03/20  Yes Martyn Ehrich, NP  roflumilast (DALIRESP) 500 MCG TABS tablet Take 1 tablet (500 mcg total) by mouth daily. 05/17/20  Yes Martyn Ehrich, NP  valACYclovir (VALTREX) 1000 MG tablet TAKE 1 TABLET BY MOUTH TWICE DAILY FOR 3 DAYS AS NEEDED Patient taking differently: Take 1,000 mg by mouth See admin instructions. Take one tablet (1000 mg) by mouth twice daily for 3 days - as needed for outbreaks 05/24/19  Yes Shelly Bombard, MD    Allergies    Patient has no known allergies.  Review of Systems   Review of Systems  Unable to perform ROS: Severe respiratory distress    Physical Exam Updated Vital Signs BP 99/84   Pulse (!) 54   Temp (!) 97 F (36.1 C) (Temporal)   Resp 14   SpO2 100%   Physical Exam Vitals and nursing note reviewed.  Constitutional:      General: She is in acute distress.     Appearance: She is well-developed and well-nourished.  HENT:     Head: Normocephalic and atraumatic.  Eyes:     Conjunctiva/sclera: Conjunctivae normal.  Cardiovascular:     Rate and Rhythm: Regular rhythm. Tachycardia present.     Comments: No cyanosis Pulmonary:     Comments: Increased resp effort with accessory muscle usage. Lung sounds diminished b/l.  Abdominal:     General: Bowel sounds are normal.     Palpations: Abdomen is soft.     Tenderness: There is no abdominal tenderness.  Musculoskeletal:     Right lower leg: No edema.     Left lower leg: No edema.  Skin:    General: Skin is warm.  Neurological:     Mental Status: She is alert.  Psychiatric:        Mood and Affect: Mood and affect normal.        Behavior: Behavior normal.     ED Results / Procedures / Treatments   Labs (all labs ordered are listed, but only abnormal results are displayed) Labs Reviewed  COMPREHENSIVE METABOLIC PANEL - Abnormal; Notable for the following components:      Result Value   Glucose, Bld 218 (*)    All other components  within normal limits  CBC WITH DIFFERENTIAL/PLATELET - Abnormal; Notable for the following components:   Hemoglobin 11.3 (*)    MCH 25.1 (*)  MCHC 29.4 (*)    RDW 16.3 (*)    All other components within normal limits  LACTIC ACID, PLASMA - Abnormal; Notable for the following components:   Lactic Acid, Venous 2.8 (*)    All other components within normal limits  LACTIC ACID, PLASMA - Abnormal; Notable for the following components:   Lactic Acid, Venous 2.9 (*)    All other components within normal limits  RESP PANEL BY RT-PCR (FLU A&B, COVID) ARPGX2  BRAIN NATRIURETIC PEPTIDE  PROTIME-INR  HIV ANTIBODY (ROUTINE TESTING W REFLEX)  TROPONIN I (HIGH SENSITIVITY)  TROPONIN I (HIGH SENSITIVITY)    EKG EKG Interpretation  Date/Time:  Saturday September 09 2020 18:28:01 EST Ventricular Rate:  144 PR Interval:    QRS Duration: 67 QT Interval:  274 QTC Calculation: 424 R Axis:   90 Text Interpretation: Sinus tachycardia rate related changes, otherwise similar to previous Confirmed by Charlesetta Shanks [phone]) on 09/09/2020 7:12:02 PM   Radiology DG Chest Port 1 View  Result Date: 09/09/2020 CLINICAL DATA:  Respiratory failure.  COPD. EXAM: PORTABLE CHEST 1 VIEW COMPARISON:  08/12/2020 chest radiograph. FINDINGS: Surgical clips overlie the right axilla. Right breast prosthesis. Stable cardiomediastinal silhouette with normal heart size. No pneumothorax. No pleural effusion. Hyperinflated lungs and severe emphysema. No pulmonary edema. No acute consolidative airspace disease. IMPRESSION: Hyperinflated lungs and severe emphysema, compatible with reported COPD. No acute cardiopulmonary disease. Electronically Signed   By: Ilona Sorrel M.D.   On: 09/09/2020 18:58    Procedures .Critical Care Performed by: Deon Ivey, [place] N, PA-C Authorized by: Finn Altemose, [place] N, PA-C   Critical care provider statement:    Critical care time (minutes):  45   Critical care time was exclusive of:   Separately billable procedures and treating other patients and teaching time   Critical care was necessary to treat or prevent imminent or life-threatening deterioration of the following conditions:  Respiratory failure   Critical care was time spent personally by me on the following activities:  Discussions with consultants, evaluation of patient's response to treatment, examination of patient, ordering and performing treatments and interventions, ordering and review of laboratory studies, ordering and review of radiographic studies, pulse oximetry, re-evaluation of patient's condition, obtaining history from patient or surrogate and review of old charts   I assumed direction of critical care for this patient from another provider in my specialty: no     (including critical care time)  Medications Ordered in ED Medications  LORazepam (ATIVAN) 2 MG/ML injection (has no administration in time range)  aspirin EC tablet 81 mg (0 mg Oral Hold 09/09/20 2214)  pantoprazole (PROTONIX) EC tablet 40 mg (has no administration in time range)  ferrous sulfate tablet 325 mg (0 mg Oral Hold 09/09/20 2214)  albuterol (PROVENTIL) (2.5 MG/3ML) 0.083% nebulizer solution 2.5 mg (has no administration in time range)  Budeson-Glycopyrrol-Formoterol 160-9-4.8 MCG/ACT AERO 2 puff (has no administration in time range)  roflumilast (DALIRESP) tablet 500 mcg (0 mcg Oral Hold 09/09/20 2214)  cycloSPORINE (RESTASIS) 0.05 % ophthalmic emulsion 1 drop (has no administration in time range)  enoxaparin (LOVENOX) injection 40 mg (has no administration in time range)  cefTRIAXone (ROCEPHIN) 1 g in sodium chloride 0.9 % 100 mL IVPB (has no administration in time range)  predniSONE (DELTASONE) tablet 40 mg (has no administration in time range)  ondansetron (ZOFRAN) injection 4 mg (4 mg Intravenous Given 09/09/20 1842)  LORazepam (ATIVAN) injection 1 mg (1 mg Intravenous Given 09/09/20 1841)  albuterol (PROVENTIL,VENTOLIN)  solution  continuous neb (10 mg/hr Nebulization Given 09/09/20 1929)  ipratropium-albuterol (DUONEB) 0.5-2.5 (3) MG/3ML nebulizer solution 3 mL (3 mLs Nebulization Given 09/09/20 1929)  magnesium sulfate IVPB 2 g 50 mL (0 g Intravenous Stopped 09/09/20 1921)  lactated ringers bolus 500 mL (0 mLs Intravenous Stopped 09/09/20 2030)  lactated ringers bolus 500 mL (0 mLs Intravenous Stopped 09/09/20 2030)    ED Course  I have reviewed the triage vital signs and the nursing notes.  Pertinent labs & imaging results that were available during my care of the patient were reviewed by me and considered in my medical decision making (see chart for details).  Clinical Course as of 09/09/20 2230  Sat Sep 09, 2020  1843 Patient presenting via EMS for acute respiratory distress.  Patient was initially placed on CPAP, however was very fatigued to therefore placed on BiPAP.  She wass given DuoNeb, 125 Solu-Medrol.  Patient denies fever or other infectious symptoms.  Symptoms began today, feels like her COPD.  4 L nasal cannula at baseline.  Followed by Dr. Melvyn Novas with pulmonology. [JR]  1938 Patient's resp rate and HR improved. She has improved effort as well. BP soft, LR bolus running. [JR]  2100 Dr. Myrtie Hawk with Family Practice to admit. [JR]    Clinical Course User Index [JR] Teneka Malmberg, [place] N, PA-C   MDM Rules/Calculators/A&P                          Patient presenting to the ED in acute respiratory distress, as detailed in above HPI.  She has increased respiratory effort and rate on evaluation with significantly diminished breath sounds bilaterally.  She is given IV magnesium and placed on BiPAP upon evaluation with respiratory technician at bedside.  She becomes nauseated and began dry heaving.  This is treated with Zofran and a small dose of Ativan.  She is also given additional continuous albuterol neb, and closely monitored.  17:30: Patient's BP downtrending, LR bolus ordered. Will continue to  monitor. Portable CXR personally reviewed and interpreted by myself, showing hyperinflation, no obvious infiltrate, no PTX.  She is stabilized on BiPAP with improved respiratory effort and rate, as well as improve heart rate. BP normalizing as well with IVF. She is able to provide additional history once more stabilized, states yesterday she was feeling tightness in her chest and shortness of breath.  She treated with breathing treatments, however did not provide enough relief.  She is having some pain in tightness in the center of her chest that radiated to her mid back though does report recent spine injury and could not tell if this was related.  Symptoms significantly worsened today. No infectious symptoms.  She feels improvement on BiPAP, after additional neb treatment and magnesium.   Labs are reassuring with no leukocytosis, trop wnl, BNP wnl, metabolic panel with hyperglycemia, lactic acidosis of 2.9. Covid swab is negative. Presentation likely 2/t COPD exacerbation.   Patient will be admitted for acute respiratory failure 2/t COPD exacerbation.   Final Clinical Impression(s) / ED Diagnoses Final diagnoses:  Acute respiratory failure with hypoxia ([place])  COPD exacerbation [facility])    Rx / DC Orders ED Discharge Orders    None       Breean Nannini, [place] N, PA-C 09/09/20 2230    Charlesetta Shanks, MD 09/09/20 2322

## 2020-09-09 NOTE — Progress Notes (Signed)
Patient came via EMS on CPAP for respiratory distress breathing 55 bpm, placed on Servo I non invasive mode, patient WOB improved to 27 bpm, patient stated that she's feeling better placed on above settings, MD in room and aware, SATS 100%, RR 27 bpm, HR 143 bpm, will continue to monitor patient.

## 2020-09-10 ENCOUNTER — Other Ambulatory Visit: Payer: Self-pay

## 2020-09-10 ENCOUNTER — Encounter (HOSPITAL_COMMUNITY): Payer: Self-pay | Admitting: Student in an Organized Health Care Education/Training Program

## 2020-09-10 DIAGNOSIS — J9601 Acute respiratory failure with hypoxia: Secondary | ICD-10-CM

## 2020-09-10 LAB — CBC
HCT: 34 % — ABNORMAL LOW (ref 36.0–46.0)
Hemoglobin: 10.3 g/dL — ABNORMAL LOW (ref 12.0–15.0)
MCH: 25.3 pg — ABNORMAL LOW (ref 26.0–34.0)
MCHC: 30.3 g/dL (ref 30.0–36.0)
MCV: 83.5 fL (ref 80.0–100.0)
Platelets: 297 10*3/uL (ref 150–400)
RBC: 4.07 MIL/uL (ref 3.87–5.11)
RDW: 16.1 % — ABNORMAL HIGH (ref 11.5–15.5)
WBC: 7.9 10*3/uL (ref 4.0–10.5)
nRBC: 0 % (ref 0.0–0.2)

## 2020-09-10 LAB — BASIC METABOLIC PANEL
Anion gap: 13 (ref 5–15)
BUN: 11 mg/dL (ref 8–23)
CO2: 25 mmol/L (ref 22–32)
Calcium: 9.2 mg/dL (ref 8.9–10.3)
Chloride: 101 mmol/L (ref 98–111)
Creatinine, Ser: 0.73 mg/dL (ref 0.44–1.00)
GFR, Estimated: >60 mL/min (ref >60)
Glucose, Bld: 149 mg/dL — ABNORMAL HIGH (ref 70–99)
Potassium: 4.5 mmol/L (ref 3.5–5.1)
Sodium: 139 mmol/L (ref 135–145)

## 2020-09-10 LAB — BLOOD GAS, ARTERIAL
Acid-Base Excess: 0.6 mmol/L (ref 0.0–2.0)
Bicarbonate: 25.7 mmol/L (ref 20.0–28.0)
Drawn by: 336832
FIO2: 30
O2 Saturation: 99.3 %
Patient temperature: 37
pCO2 arterial: 48.7 mmHg — ABNORMAL HIGH (ref 32.0–48.0)
pH, Arterial: 7.341 — ABNORMAL LOW (ref 7.350–7.450)
pO2, Arterial: 186 mmHg — ABNORMAL HIGH (ref 83.0–108.0)

## 2020-09-10 LAB — HIV ANTIBODY (ROUTINE TESTING W REFLEX): HIV Screen 4th Generation wRfx: NONREACTIVE

## 2020-09-10 LAB — LACTIC ACID, PLASMA
Lactic Acid, Venous: 3.5 mmol/L (ref 0.5–1.9)
Lactic Acid, Venous: 2 mmol/L (ref 0.5–1.9)

## 2020-09-10 LAB — D-DIMER, QUANTITATIVE: D-Dimer, Quant: 0.41 ug/mL-FEU (ref 0.00–0.50)

## 2020-09-10 MED ORDER — HYDROXYZINE HCL 25 MG PO TABS
25.0000 mg | ORAL_TABLET | ORAL | Status: DC | PRN
  Administered 2020-09-10 – 2020-09-11 (×2): 25 mg via ORAL
  Filled 2020-09-10 (×2): qty 1

## 2020-09-10 MED ORDER — SODIUM CHLORIDE 0.9 % IV SOLN: INTRAVENOUS | Status: AC

## 2020-09-10 MED ORDER — METHYLPREDNISOLONE SODIUM SUCC 125 MG IJ SOLR
60.0000 mg | Freq: Every day | INTRAMUSCULAR | Status: DC
  Administered 2020-09-10: 14:00:00 60 mg via INTRAVENOUS
  Filled 2020-09-10: qty 2

## 2020-09-10 MED ORDER — ACETAMINOPHEN 500 MG PO TABS
1000.0000 mg | ORAL_TABLET | Freq: Three times a day (TID) | ORAL | Status: DC | PRN
  Administered 2020-09-10 – 2020-09-12 (×4): 1000 mg via ORAL
  Filled 2020-09-10 (×4): qty 2

## 2020-09-10 MED ORDER — IPRATROPIUM-ALBUTEROL 0.5-2.5 (3) MG/3ML IN SOLN
3.0000 mL | Freq: Three times a day (TID) | RESPIRATORY_TRACT | Status: DC
  Administered 2020-09-11 – 2020-09-15 (×14): 3 mL via RESPIRATORY_TRACT
  Filled 2020-09-10 (×14): qty 3

## 2020-09-10 MED ORDER — ALBUTEROL SULFATE (2.5 MG/3ML) 0.083% IN NEBU
2.5000 mg | INHALATION_SOLUTION | RESPIRATORY_TRACT | Status: DC | PRN
  Administered 2020-09-10: 09:00:00 2.5 mg via RESPIRATORY_TRACT
  Filled 2020-09-10: qty 3

## 2020-09-10 MED ORDER — METHYLPREDNISOLONE SODIUM SUCC 125 MG IJ SOLR
60.0000 mg | Freq: Two times a day (BID) | INTRAMUSCULAR | Status: AC
  Administered 2020-09-11 – 2020-09-12 (×4): 60 mg via INTRAVENOUS
  Filled 2020-09-10 (×5): qty 2

## 2020-09-10 MED ORDER — AZITHROMYCIN 500 MG PO TABS
500.0000 mg | ORAL_TABLET | Freq: Every day | ORAL | Status: AC
  Administered 2020-09-10 – 2020-09-14 (×5): 500 mg via ORAL
  Filled 2020-09-10: qty 2
  Filled 2020-09-10: qty 1
  Filled 2020-09-10: qty 2
  Filled 2020-09-10: qty 1
  Filled 2020-09-10: qty 2

## 2020-09-10 MED ORDER — IPRATROPIUM-ALBUTEROL 0.5-2.5 (3) MG/3ML IN SOLN
3.0000 mL | RESPIRATORY_TRACT | Status: DC
  Administered 2020-09-10 (×3): 3 mL via RESPIRATORY_TRACT
  Filled 2020-09-10 (×3): qty 3

## 2020-09-10 MED ORDER — RAMELTEON 8 MG PO TABS
8.0000 mg | ORAL_TABLET | Freq: Every day | ORAL | Status: DC
  Administered 2020-09-10 – 2020-09-14 (×5): 8 mg via ORAL
  Filled 2020-09-10 (×6): qty 1

## 2020-09-10 MED ORDER — IPRATROPIUM-ALBUTEROL 0.5-2.5 (3) MG/3ML IN SOLN
3.0000 mL | RESPIRATORY_TRACT | Status: DC | PRN
  Filled 2020-09-10: qty 3

## 2020-09-10 MED ORDER — TRAMADOL HCL 50 MG PO TABS
50.0000 mg | ORAL_TABLET | Freq: Three times a day (TID) | ORAL | Status: DC | PRN
  Administered 2020-09-10 – 2020-09-14 (×3): 50 mg via ORAL
  Filled 2020-09-10 (×3): qty 1

## 2020-09-10 NOTE — Progress Notes (Signed)
Pt has arrived to the unit. MD notified.

## 2020-09-10 NOTE — Progress Notes (Signed)
Family Medicine Teaching Service Daily Progress Note Intern Pager: [phone]  Patient name: Mary Jenkins Medical record number: [id number] Date of birth: 18-May-1956 Age: 64 y.o. Gender: female  Primary Care Provider: Matilde Haymaker, MD Consultants: None  Code Status: Full   Pt Overview and Major Events to Date:  12/18: Admitted  Assessment and Plan:  Mary Jenkins a 64 y.o.femalewith PMH significant forCOPDGOLD IV, Anemia, MDD, GERD, H/O breast Cancer, h/o thoracic vertebral compression fx, Cervical spine arthritis and Cataract.  Acute respiratory distress on COPDGOLD IV- On examination today moderate to severe increased work of breathing, poor air entry bilaterally with some mild wheeze. Received BIPAP overnight.  When I went to see her this morning RN was about to put her back on BiPAP. Sats 100% on 4L, BP 104/68, RR 14, HR 74. Admitted overnight. WBC wnl, no documented fevers. LA2.8>2.9>3.5>2. D-dimer neg. No need for CTA as patient is low risk for PE. CXR: Hyperinflated lungs and severe emphysema, compatible with reported COPD. S/p Magnesium sulphate, duonebs, 1L LR.  -ABG stat -CTX 1g Q24H -Continue albuterol neb Q4PRN -Prednisone 60m daily for 5 days  -Breo Ellipta, Incruse Ellipta 1 puff daily -Daliresp 525m daily  -Trend LA -Normal saline 100 cc/hr - Bipap PRN and wean as tolerated -Maintain O2 saturation88-92%   -PT/OTeval and treat -Speak with her pulmonologist 12/20 re f/u appointment   Vertebral compression fx Associated with osteopenia. Dx on recent admission 07/2020 and still undergoing pain treatment and TSLO brace. Following up with neurosurgery OP. -Continue salmon calcitonin nasal spray as respiratory status will allow -Tylenol 100038m8PRN -Tramadol 2m78mPRN -Encourage use of incentive spirometer  Normocytic Anemia, chronic, stable  Hb 11.3 on admission. Likely anemia of chronic disease.  Home meds:ferrous sulfate 325 mg  daily. -Continueferrous sulfate325 mg daily.  GERD, chronic, stable Home meds:Protonix 40 mg daily. -Continue Protonix 40mg2mly   H/o Breast cancer Patient has history of right-sided breast cancer 23 years ago.Sheunderwent a unilateral mastectomy with breast reconstruction and 6 months of chemotherapy.She no longer seesan oncologist  FEN/GI: Normal diet PPx: Lovenox   Status is: Inpatient  Remains inpatient appropriate because:Hemodynamically unstable, Ongoing diagnostic testing needed not appropriate for outpatient work up, IV treatments appropriate due to intensity of illness or inability to take PO and Inpatient level of care appropriate due to severity of illness   Dispo: The patient is from: Home              Anticipated d/c is to: Home              Anticipated d/c date is: 3 days              Patient currently is not medically stable to d/c.   Subjective:  Patient feels like she still struggling to breathe, felt better on the BiPAP.  Denies chest pain.  She would like her daughter to bring in her back brace.  Objective: Temp:  [97 F (36.1 C)-99 F (37.2 C)] 98.2 F (36.8 C) (12/19 0410) Pulse Rate:  [53-146] 75 (12/19 0923) Resp:  [10-24] 14 (12/19 0923) BP: (88-148)/(64-96) 118/72 (12/19 0800) SpO2:  [97 %-100 %] 100 % (12/19 0923) FiO2 (%):  [30 %-100 %] 30 % (12/19 0923) Weight:  [46.5 kg-46.9 kg] 46.5 kg (12/19 0410)  Physical Exam: General: unwell appearing, frail female, nasal cannula Cardiovascular: S1 and S2 present, no murmurs Respiratory: Poor air entry bilaterally, mild wheeze, moderate to severe work of breathing Extremities: No peripheral edema  Laboratory: Recent Labs  Lab 09/09/20 1838 09/10/20 0345  WBC 8.8 7.9  HGB 11.3* 10.3*  HCT 38.4 34.0*  PLT 378 297   Recent Labs  Lab 09/09/20 1838 09/10/20 0345  NA 138 139  K 4.1 4.5  CL 102 101  CO2 23 25  BUN 10 11  CREATININE 0.89 0.73  CALCIUM 9.3 9.2  PROT 6.8  --    BILITOT 0.6  --   ALKPHOS 68  --   ALT 13  --   AST 23  --   GLUCOSE 218* 149*      Imaging/Diagnostic Tests: DG Chest Port 1 View  Result Date: 09/09/2020 CLINICAL DATA:  Respiratory failure.  COPD. EXAM: PORTABLE CHEST 1 VIEW COMPARISON:  08/12/2020 chest radiograph. FINDINGS: Surgical clips overlie the right axilla. Right breast prosthesis. Stable cardiomediastinal silhouette with normal heart size. No pneumothorax. No pleural effusion. Hyperinflated lungs and severe emphysema. No pulmonary edema. No acute consolidative airspace disease. IMPRESSION: Hyperinflated lungs and severe emphysema, compatible with reported COPD. No acute cardiopulmonary disease. Electronically Signed   By: Ilona Sorrel M.D.   On: 09/09/2020 18:58    Lattie Haw, MD 09/10/2020, 10:41 AM PGY-2, [place] Intern pager: [phone], text pages welcome

## 2020-09-10 NOTE — Progress Notes (Signed)
Patient has order for ambulatory pulse ox, not able to complete the order due to patient being on Bi-Pap currently.

## 2020-09-10 NOTE — Hospital Course (Addendum)
Mary Jenkins is a 64 y.o. female who presented with acute respiratory distress 2/2 COPD exacerbation. PMH significant for COPD GOLD IV, Anemia, MDD, GERD, H/O breast Cancer, h/o thoracic vertebral compression fx, Cervical spine arthritis and Cataract. Below is her hospital course listed by problem. Refer to the H&P for additional information.   Acute Respiratory Distress 2/2 COPD Exacerbation Patient admitted with tachypnea and respiratory distress presumed to be a COPD exacerbation. CXR negative for acute findings, negative D-dimer, patient afebrile with normal WBC and negative respiratory panel; LA elevated to 2.9. In the ED, patient was given 2g of magnesium and started on BiPAP, CTX (12/18-12/21), and Azithromycin (12/19-12/23). Started on steroids and discharged with prolonged 2-week steroid taper. Home COPD medications were continued. Patient was weaned as tolerated to her baseline 3-4L.   Vertebral Compression Fx Patient was continued on salmon calcitonin nasal spray. Pain control additionally with Tylenol and Tramadol. Evaluated and treated by PT and OT while hospitalized. Recommended for home heatlh OT/supervision-assistance 24 hour. Patient apparently denied home health PT.   Other chronic problems were stable and treated with home medications as appropriate.  Anxiety She was seen by her PCP in the inpatient service and discussed symptoms of anxiety.  She is previously been on mirtazapine at night but is not currently taking any mirtazapine.  We will plan to send home with a short supply of Atarax and discuss restarting mirtazapine or an SSRI in the outpatient setting.  Issues for Follow-up Consider adding LAMA in addition to LABA due to severity  Monitor home oxygen requirements Schedule follow-up appointment with PCP to discuss anxiety Consider bisphophonates orally, fosomax weekly. Does not need DEXA scan, has diagnostic fracture Ensure close follow up with Dr Melvyn Novas,  Pulmonologist. Next f/u is on Jan 31st.

## 2020-09-10 NOTE — Progress Notes (Signed)
Called daughter about back brace

## 2020-09-10 NOTE — Progress Notes (Signed)
S: Patient continues to feel short of breath. Also notes some heart palpitations. She is currently only having pain in her back. States that Tylenol has worked well for her in the past. She has taken oxycontin before but she does not like the way it made her feel.   O:  Blood pressure 114/79, pulse 89, temperature 98.2 F (36.8 C), temperature source Oral, resp. rate 16, height 5' 6" (1.676 m), weight 46.5 kg, SpO2 100 %. Gen: Pleasant, mildly anxious-appearing Resp: Poor air movement diffusely, on BiPAP saturating >95% Cardio: RRR, no murmur  A/P:  64 y/o female with hx COPD, thoracic vertebral compression fx admitted for PNA. Still on BiPAP, saturating >95%. Patient hesitant to come off BiPAP. Per RN, anxiety may be a contributing factor. Lungs with poor air movement throughout. Patient will benefit from continued duonebs, steroids, etc. Plan per prior note with the following additions:  - Hydroxyzine 25 mg q4h PRN anxiety - Azithromycin 571m daily  - Increase methylprednisolone to q12h

## 2020-09-11 DIAGNOSIS — E43 Unspecified severe protein-calorie malnutrition: Secondary | ICD-10-CM | POA: Insufficient documentation

## 2020-09-11 MED ORDER — HYDROXYZINE HCL 25 MG PO TABS: 25.0000 mg | ORAL_TABLET | Freq: Four times a day (QID) | ORAL | 0 refills | Status: AC | PRN

## 2020-09-11 MED ORDER — ENSURE ENLIVE PO LIQD
237.0000 mL | Freq: Three times a day (TID) | ORAL | Status: DC
  Administered 2020-09-11 – 2020-09-15 (×8): 237 mL via ORAL

## 2020-09-11 MED ORDER — LOPERAMIDE HCL 2 MG PO CAPS
2.0000 mg | ORAL_CAPSULE | Freq: Once | ORAL | Status: AC
  Administered 2020-09-11: 22:00:00 2 mg via ORAL
  Filled 2020-09-11: qty 1

## 2020-09-11 MED ORDER — ADULT MULTIVITAMIN W/MINERALS CH
1.0000 | ORAL_TABLET | Freq: Every day | ORAL | Status: DC
  Administered 2020-09-11 – 2020-09-15 (×5): 1 via ORAL
  Filled 2020-09-11 (×5): qty 1

## 2020-09-11 NOTE — Evaluation (Signed)
Physical Therapy Evaluation Patient Details Name: Mary Jenkins MRN: [id number] DOB: Apr 30, 1956 Today's Date: 09/11/2020   History of Present Illness  64 yo female presenting to ED via EMS with acute respiratory failure. Placed on BiPAP. Recent admission 07/2020 with T11 compression fx and requires lumbar corset for mobility. PMH including COPD on 4L home O2, DDD cervical, cataracts, breast cancer, and Anemia.  Clinical Impression  Prior to admission, pt lives with her daughter, requires assist for ADL's and is a limited household ambulator. Pt presents with decreased cardiopulmonary endurance, mild balance deficits and weakness. Pt ambulating 50 feet with no assistive device and min guard assist. SpO2 98-100% on 4L O2, HR peak 121 bpm. Recommend HHPT at discharge to maximize functional mobility.     Follow Up Recommendations Home health PT;Supervision for mobility/OOB    Equipment Recommendations  None recommended by PT    Recommendations for Other Services       Precautions / Restrictions Precautions Precautions: Back Precaution Booklet Issued: No Precaution Comments: Reveiwed back precautions; pt able to recall with increased time Required Braces or Orthoses: Spinal Brace Spinal Brace: Lumbar corset;Applied in sitting position Restrictions Weight Bearing Restrictions: No      Mobility  Bed Mobility Overal bed mobility: Modified Independent Bed Mobility: Rolling;Sidelying to Sit;Sit to Sidelying Rolling: Min guard Sidelying to sit: Min guard     Sit to sidelying: Min guard General bed mobility comments: Min Guard A for safety    Transfers Overall transfer level: Needs assistance Equipment used: None Transfers: Sit to/from Stand Sit to Stand: Supervision         General transfer comment: Supervision for safety  Ambulation/Gait Ambulation/Gait assistance: Min Gaffer (Feet): 50 Feet Assistive device: None Gait  Pattern/deviations: Step-through pattern;Decreased stride length;Narrow base of support Gait velocity: decreased   General Gait Details: Pt requiring mostly supervision, one mild lateral LOB requiring min guard assist to correct.  Stairs            Wheelchair Mobility    Modified Rankin (Stroke Patients Only)       Balance Overall balance assessment: Needs assistance Sitting-balance support: No upper extremity supported;Feet supported Sitting balance-Leahy Scale: Good     Standing balance support: No upper extremity supported;During functional activity Standing balance-Leahy Scale: Fair                               Pertinent Vitals/Pain Pain Assessment: Faces Faces Pain Scale: Hurts a little bit Pain Location: Back Pain Descriptors / Indicators: Discomfort Pain Intervention(s): Monitored during session    Home Living Family/patient expects to be discharged to:: Private residence Living Arrangements: Children Available Help at Discharge: Family;Available 24 hours/day Type of Home: Apartment Home Access: Level entry     Home Layout: One level Home Equipment: Cane - single point;Bedside commode;Walker - 4 wheels Additional Comments: 4L home O2    Prior Function Level of Independence: Needs assistance   Gait / Transfers Assistance Needed: Pt reports using rollator in community, does not use AD in the house  ADL's / Homemaking Assistance Needed: Daughter assists with bathing and cooking. Daugher has been assisting with dressing since compression fx. Does not drive  Comments: Attempting to get an aide for ADLs. had HHPT and HHOT     Hand Dominance   Dominant Hand: Right    Extremity/Trunk Assessment   Upper Extremity Assessment Upper Extremity Assessment: Defer to OT evaluation    Lower  Extremity Assessment Lower Extremity Assessment: Overall WFL for tasks assessed    Cervical / Trunk Assessment Cervical / Trunk Assessment: Other  exceptions Cervical / Trunk Exceptions: T11 compression fx  Communication   Communication: No difficulties  Cognition Arousal/Alertness: Awake/alert Behavior During Therapy: WFL for tasks assessed/performed Overall Cognitive Status: Within Functional Limits for tasks assessed                                        General Comments General comments (skin integrity, edema, etc.): At rest: HR 120, SpO2 98% on 4L, and RR 15-24. With activity: HR 130-140s, SpO2 87% on 4L, and RR 30s.    Exercises General Exercises - Lower Extremity Long Arc Quad: Both;10 reps;Seated Hip Flexion/Marching: Both;10 reps;Seated   Assessment/Plan    PT Assessment Patient needs continued PT services  PT Problem List Decreased activity tolerance;Decreased balance;Decreased mobility       PT Treatment Interventions DME instruction;Gait training;Functional mobility training;Therapeutic activities;Therapeutic exercise;Balance training;Patient/family education    PT Goals (Current goals can be found in the Care Plan section)  Acute Rehab PT Goals Patient Stated Goal: Go home when I am better PT Goal Formulation: With patient Time For Goal Achievement: 09/25/20 Potential to Achieve Goals: Good    Frequency Min 3X/week   Barriers to discharge        Co-evaluation               AM-PAC PT "6 Clicks" Mobility  Outcome Measure Help needed turning from your back to your side while in a flat bed without using bedrails?: None Help needed moving from lying on your back to sitting on the side of a flat bed without using bedrails?: None Help needed moving to and from a bed to a chair (including a wheelchair)?: None Help needed standing up from a chair using your arms (e.g., wheelchair or bedside chair)?: None Help needed to walk in hospital room?: A Little Help needed climbing 3-5 steps with a railing? : A Little 6 Click Score: 22    End of Session Equipment Utilized During Treatment:  Oxygen Activity Tolerance: Patient tolerated treatment well Patient left: in chair;with call bell/phone within reach Nurse Communication: Mobility status PT Visit Diagnosis: Unsteadiness on feet (R26.81);Difficulty in walking, not elsewhere classified (R26.2)    Time: 0479-9872 PT Time Calculation (min) (ACUTE ONLY): 20 min   Charges:   PT Evaluation $PT Eval Moderate Complexity: 1 Mod          Wyona Almas, PT, DPT Acute Rehabilitation Services Pager [phone] Office [phone]   Deno Etienne 09/11/2020, 12:35 PM

## 2020-09-11 NOTE — Progress Notes (Signed)
Initial Nutrition Assessment  DOCUMENTATION CODES:   Underweight,Severe malnutrition in context of chronic illness  INTERVENTION:   -Ensure Enlive po TID, each supplement provides 350 kcal and 20 grams of protein -MVI with minerals daily  NUTRITION DIAGNOSIS:   Severe Malnutrition related to chronic illness (COPD) as evidenced by severe fat depletion,severe muscle depletion,energy intake < 75% for > or equal to 1 month.  GOAL:   Patient will meet greater than or equal to 90% of their needs  MONITOR:   PO intake,Supplement acceptance,Labs,Weight trends,Skin,I & O's  REASON FOR ASSESSMENT:   Consult Assessment of nutrition requirement/status  ASSESSMENT:   Addaline Jenkins is a 64 y.o. female with PMH significant for COPD GOLD IV, Anemia, MDD, GERD, H/O breast Cancer, h/o thoracic vertebral compression fx, Cervical spine arthritis and Cataract.  Pt admitted with COPD exacerbation.  Reviewed I/O's: -527 ml x 24 hours and -338 ml since admission  UOP: 1.4 L x 24 hours  Spoke with pt at bedside, who reports experiencing a general decline in health over the past 4 months, secondary to multiple hospitalizations. Per pt, she has a very limited diet due to poor appetite and food intolerances. She shares that foods such as spaghetti and pizza often exacerbate her breathing, so she avoids them. Pt reports she eats 2-3 meals per day (Breakfast: eggs and toast (which she sometimes skips), Lunch: 1/2 frozen dinner; Dinner: other 1/2 of frozen dinner). Pt consumes an Ensure supplement daily with every meal due to poor oral intake.   Per pt, she consumed about 25% of her breakfast today.   Reviewed wt hx; pt has experienced a 5.9% wt loss over the past 6 months, which not is significant for time frame. Per pt, she has lost about 18 pounds in the past 4 months.   Discussed with pt importance of good meal and supplement intake to promote healing. Pt requesting Ensure and reports desire  to continue these at home. She is able to have supplements delivered to her home through her insurance company.   Medications reviewed and include ferrous sulfate and solu-medrol.  Labs reviewed.   NUTRITION - FOCUSED PHYSICAL EXAM:  Flowsheet Row Most Recent Value  Orbital Region Moderate depletion  Upper Arm Region Severe depletion  Thoracic and Lumbar Region Moderate depletion  Buccal Region Severe depletion  Temple Region Severe depletion  Clavicle Bone Region Severe depletion  Clavicle and Acromion Bone Region Severe depletion  Scapular Bone Region Severe depletion  Dorsal Hand Severe depletion  Patellar Region Severe depletion  Anterior Thigh Region Severe depletion  Posterior Calf Region Severe depletion  Edema (RD Assessment) None  Hair Reviewed  Eyes Reviewed  Mouth Reviewed  Skin Reviewed  Nails Reviewed       Diet Order:   Diet Order            Diet regular Room service appropriate? Yes; Fluid consistency: Thin  Diet effective now                 EDUCATION NEEDS:   Education needs have been addressed  Skin:  Skin Assessment: Reviewed RN Assessment  Last BM:  09/11/20  Height:   Ht Readings from Last 1 Encounters:  09/10/20 _0  (1.676 m)    Weight:   Wt Readings from Last 1 Encounters:  09/11/20 42.9 kg    Ideal Body Weight:  59.1 kg  BMI:  Body mass index is 15.27 kg/m.  Estimated Nutritional Needs:   Kcal:  6962-9528  Protein:  90-105 grams  Fluid:  > 1.7 L    Loistine Chance, RD, LDN, [place] Registered Dietitian II Certified Diabetes Care and Education Specialist Please refer to [facility] for RD and/or RD on-call/weekend/after hours pager

## 2020-09-11 NOTE — Evaluation (Signed)
Occupational Therapy Evaluation Patient Details Name: Mary Jenkins MRN: [id number] DOB: [birth year] Today's Date: 09/11/2020    History of Present Illness 64 yo female presenting to ED via EMS with acute respiratory failure. Placed on BiPAP. Recent admission 07/2020 with T11 compression fx and requires lumbar corset for mobility. PMH including COPD on 4L home O2, DDD cervical, cataracts, breast cancer, and Anemia.    Clinical Impression   PTA, pt was living with her daughter and required assistance for bathing, dressing, and IADLs; prior to T11 fx, pt was able to dress herself. Pt currently requiring Min A for UB ADLs, Min-Max A for LB ADLs, and Min Guard A for toileting. Pt presenting with poor activity tolerance as seen by elevated HR and RR with minimal activity. Pt would benefit from further acute OT to facilitate safe dc. Recommend dc to home with HHOT for further OT to optimize safety, independence with ADLs, and return to PLOF.      Follow Up Recommendations  Home health OT;Supervision/Assistance - 24 hour ([place] aide)    Equipment Recommendations  None recommended by OT (Pt's brother plans on ordering her a shower chair)    Recommendations for Other Services PT consult     Precautions / Restrictions Precautions Precautions: Back Precaution Booklet Issued: No Precaution Comments: Reveiwed back precautions; pt able to recall with increased time Required Braces or Orthoses: Spinal Brace Spinal Brace: Lumbar corset;Applied in sitting position Restrictions Weight Bearing Restrictions: No      Mobility Bed Mobility Overal bed mobility: Needs Assistance Bed Mobility: Rolling;Sidelying to Sit;Sit to Sidelying Rolling: Min guard Sidelying to sit: Min guard     Sit to sidelying: Min guard General bed mobility comments: Min Guard A for safety    Transfers Overall transfer level: Needs assistance Equipment used: None Transfers: Sit to/from Stand Sit to Stand: Min  guard         General transfer comment: Min Guard A    Balance Overall balance assessment: Needs assistance Sitting-balance support: No upper extremity supported;Feet supported Sitting balance-Leahy Scale: Fair     Standing balance support: No upper extremity supported;During functional activity Standing balance-Leahy Scale: Good                             ADL either performed or assessed with clinical judgement   ADL Overall ADL's : Needs assistance/impaired Eating/Feeding: Set up;Sitting   Grooming: Wash/dry hands;Wash/dry face;Sitting;Standing;Min guard;Supervision/safety Grooming Details (indicate cue type and reason): Pt washing her hands while standing at sink with mIn Guard A for safety. Pt presenting with poor activity tolerance and HR elevating to 130-140s with minimal activity. Returned to EOB and pt then washing her face Upper Body Bathing: Minimal assistance;Sitting   Lower Body Bathing: Minimal assistance;Sit to/from stand   Upper Body Dressing : Minimal assistance;Sitting   Lower Body Dressing: Maximal assistance;Sit to/from stand Lower Body Dressing Details (indicate cue type and reason): Max A for donning socks Toilet Transfer: Min Statistician Details (indicate cue type and reason): Min Guard A for safety Toileting- Clothing Manipulation and Hygiene: Min guard;Sitting/lateral lean Toileting - Clothing Manipulation Details (indicate cue type and reason): Adhering to back precautions     Functional mobility during ADLs: Min guard General ADL Comments: Pt performing toileting and then hand hygiene at sink. Pt requiring seated rest breaks between each task due to elevated HR and RR.     Vision Baseline Vision/History: Wears glasses Wears Glasses: At  all times Patient Visual Report: No change from baseline       Perception     Praxis      Pertinent Vitals/Pain Pain Assessment: Faces Faces Pain Scale: Hurts little  more Pain Location: Back Pain Descriptors / Indicators: Discomfort Pain Intervention(s): Monitored during session;Limited activity within patient's tolerance;Repositioned     Hand Dominance Right   Extremity/Trunk Assessment Upper Extremity Assessment Upper Extremity Assessment: Overall WFL for tasks assessed   Lower Extremity Assessment Lower Extremity Assessment: Defer to PT evaluation   Cervical / Trunk Assessment Cervical / Trunk Assessment: Other exceptions Cervical / Trunk Exceptions: T11 compression fx   Communication Communication Communication: No difficulties   Cognition Arousal/Alertness: Awake/alert Behavior During Therapy: WFL for tasks assessed/performed Overall Cognitive Status: Within Functional Limits for tasks assessed                                     General Comments  At rest: HR 120, SpO2 98% on 4L, and RR 15-24. With activity: HR 130-140s, SpO2 87% on 4L, and RR 30s.    Exercises     Shoulder Instructions      Home Living Family/patient expects to be discharged to:: Private residence Living Arrangements: Children Available Help at Discharge: Family;Available 24 hours/day Type of Home: Apartment Home Access: Level entry     Home Layout: One level     Bathroom Shower/Tub: Teacher, early years/pre: Handicapped height Bathroom Accessibility: Yes   Home Equipment: Cane - single point;Bedside commode;Walker - 4 wheels   Additional Comments: 4L home O2      Prior Functioning/Environment Level of Independence: Needs assistance  Gait / Transfers Assistance Needed: Pt reports using rollator in community, does not use AD in the house ADL's / Homemaking Assistance Needed: Daughter assists with bathing and cooking. Daugher has been assisting with dressing since compression fx. Does not drive   Comments: Attempting to get an aide for ADLs. had HHPT and HHOT        OT Problem List: Decreased strength;Decreased range of  motion;Decreased activity tolerance;Impaired balance (sitting and/or standing);Decreased safety awareness;Decreased knowledge of use of DME or AE;Decreased knowledge of precautions;Cardiopulmonary status limiting activity;Pain      OT Treatment/Interventions: Self-care/ADL training;Therapeutic exercise;Energy conservation;DME and/or AE instruction;Therapeutic activities;Patient/family education    OT Goals(Current goals can be found in the care plan section) Acute Rehab OT Goals Patient Stated Goal: Go home when I am better OT Goal Formulation: With patient Time For Goal Achievement: 09/25/20 Potential to Achieve Goals: Good  OT Frequency: Min 2X/week   Barriers to D/C:            Co-evaluation              AM-PAC OT "6 Clicks" Daily Activity     Outcome Measure Help from another person eating meals?: None Help from another person taking care of personal grooming?: A Little Help from another person toileting, which includes using toliet, bedpan, or urinal?: A Little Help from another person bathing (including washing, rinsing, drying)?: A Little Help from another person to put on and taking off regular upper body clothing?: A Little Help from another person to put on and taking off regular lower body clothing?: A Little 6 Click Score: 19   End of Session Equipment Utilized During Treatment: Oxygen (4L) Nurse Communication: Mobility status  Activity Tolerance: Patient limited by fatigue Patient left: in bed;with call  bell/phone within reach (elevated HOB to simulate chair position)  OT Visit Diagnosis: Unsteadiness on feet (R26.81);Other abnormalities of gait and mobility (R26.89);Muscle weakness (generalized) (M62.81);Pain Pain - part of body:  (Back)                Time: 1540-0867 OT Time Calculation (min): 23 min Charges:  OT General Charges $OT Visit: 1 Visit OT Evaluation $OT Eval Moderate Complexity: 1 Mod OT Treatments $Self Care/Home Management : 8-22  mins  Maryama Kuriakose MSOT, OTR/L Acute Rehab Pager: [phone] Office: [place] 09/11/2020, 9:49 AM

## 2020-09-11 NOTE — Progress Notes (Signed)
Interim Progress Note  S: In to check on patient this evening. States that she is feeling improved. Was able to eat today. Still feels tight but feels that she is able to breathe better. Has been off of the BiPAP since 8AM. Would like to discuss options to control anxiety.   O: Blood pressure 120/79, pulse 90, temperature 98 F (36.7 C), temperature source Oral, resp. rate 17, height 5' 6" (1.676 m), weight 42.9 kg, SpO2 100 %. Gen: Pleasant, NAD Resp: on 4L [place], CTAB with decreased air movement b/l bases, without wheezing/rhonchi/rales Card: RRR, without murmur   A/P:  64 y/o female with hx COPD, thoracic vertebral compression fx admitted for PNA. Overall improving, now on 4L  which is her baseline O2 use at home. Saturating 100%. Better air movement compared to examination last night. Possible component of anxiety that will be managed with hydroxyzine inpatient. Patient to follow up outpatient to further discuss anxiety management. - Plan per prior progress note

## 2020-09-11 NOTE — Progress Notes (Signed)
Family Medicine Teaching Service Daily Progress Note Intern Pager: [phone]  Patient name: Mary Jenkins Medical record number: [id number] Date of birth: [birth year] Age: 64 y.o. Gender: female  Primary Care Provider: Matilde Haymaker, MD Consultants: None  Code Status: Full   Pt Overview and Major Events to Date:  12/18: Admitted  Assessment and Plan: Mary Mickiewicz Scottis a 64 y.o.femalewith PMH significant forCOPDGOLD IV, Anemia, MDD, GERD, H/O breast Cancer, h/o thoracic vertebral compression fx, Cervical spine arthritis and Cataract.  Acute respiratory distress on COPDGOLD IV- Patient currently on 4L [place] with increased WOB, was on BiPAP overnight due to anxiety (per patient). Sats 100% on 4L. WBC wnl, afebrile. LA 2.8>2.9>3.5>2.0. S/p mag sulfate, duonebs, 1L LR, 1 dose prednisone 36m. ABG 7.341, pCO2 48.7, pO2 186. - CTX 1g Q24H - Azithromycin 5936mdaily (12/19-) - Continue albuterol neb Q4PRN - Methylprednisolone 6076m12h (day 2/5) - Breo Ellipta, Incruse Ellipta 1 puff daily - Daliresp 64m4maily  - Trend LA - Normal saline 100 cc/hr - Bipap PRN and wean as tolerated - Maintain O2 saturation88-92% - PT/OTeval and treat - Speak with her pulmonologist before D/c re f/u appointment   Vertebral compression fx Associated with osteopenia. Dx on recent admission 07/2020 and still undergoing pain treatment and TSLO brace. Following up with neurosurgery OP. - Continue salmon calcitonin nasal spray as respiratory status will allow - Tylenol 1000mg64mRN - Tramadol 64mg 45mN - Encourage use of incentive spirometer  Normocytic Anemia, chronic, stable  Hgb 12/19 10.3 Likely anemia of chronic disease.  - Continueferrous sulfate325 mg daily. - Monitor with CBC tomorrow  GERD, chronic, stable - Continue Protonix 40mg d65m   H/o Breast cancer Prior right mastectomy with reconstruction and 6 months of chemo. No current oncologist  FEN/GI: Normal diet PPx:  Lovenox   Status is: Inpatient  Remains inpatient appropriate because:Hemodynamically unstable, Ongoing diagnostic testing needed not appropriate for outpatient work up, IV treatments appropriate due to intensity of illness or inability to take PO and Inpatient level of care appropriate due to severity of illness   Dispo: The patient is from: Home              Anticipated d/c is to: Home              Anticipated d/c date is: 3 days              Patient currently is not medically stable to d/c.   Subjective:  Patient reports that she is doing better than when she was admitted. She states she was on "the machine" overnight mainly due to her anxiety but has been off of it since 8am. She has no other complaints currently.  Objective: Temp:  [98 F (36.7 C)-98.5 F (36.9 C)] 98.5 F (36.9 C) (12/20 0355) Pulse Rate:  [63-96] 86 (12/20 0355) Resp:  [13-20] 16 (12/20 0355) BP: (91-135)/(55-102) 131/85 (12/20 0355) SpO2:  [99 %-100 %] 100 % (12/20 0355) FiO2 (%):  [30 %] 30 % (12/20 0355) Weight:  [42.9 kg] 42.9 kg (12/20 0100)  Physical Exam: General: frail female, pleasant Cardiovascular: tachycardia at 110 on tele, no m/r/g,  Respiratory: Poor air movement bilaterally, mild diffuse wheeze, increased WOB with exaggerated chest movement  Extremities: no peripheral edema, no calf tenderness to palpation  Laboratory: Recent Labs  Lab 09/09/20 1838 09/10/20 0345  WBC 8.8 7.9  HGB 11.3* 10.3*  HCT 38.4 34.0*  PLT 378 297   Recent Labs  Lab 09/09/20 1838 09/10/20 0345  NA 138 139  K 4.1 4.5  CL 102 101  CO2 23 25  BUN 10 11  CREATININE 0.89 0.73  CALCIUM 9.3 9.2  PROT 6.8  --   BILITOT 0.6  --   ALKPHOS 68  --   ALT 13  --   AST 23  --   GLUCOSE 218* 149*      Imaging/Diagnostic Tests: DG Chest Port 1 View  Result Date: 09/09/2020 CLINICAL DATA:  Respiratory failure.  COPD. EXAM: PORTABLE CHEST 1 VIEW COMPARISON:  08/12/2020 chest radiograph. FINDINGS:  Surgical clips overlie the right axilla. Right breast prosthesis. Stable cardiomediastinal silhouette with normal heart size. No pneumothorax. No pleural effusion. Hyperinflated lungs and severe emphysema. No pulmonary edema. No acute consolidative airspace disease. IMPRESSION: Hyperinflated lungs and severe emphysema, compatible with reported COPD. No acute cardiopulmonary disease. Electronically Signed   By: Ilona Sorrel M.D.   On: 09/09/2020 18:58    Rise Patience, DO 09/11/2020, 6:22 AM PGY-2, [place] Intern pager: [phone], text pages welcome

## 2020-09-11 NOTE — TOC Transition Note (Signed)
Transition of Care [facility]) - CM/SW Discharge Note   Patient Details  Name: Mary Jenkins MRN: [id number] Date of Birth: Mar 01, 1956  Transition of Care [facility]) CM/SW Contact:  Zenon Mayo, RN Phone Number: 09/11/2020, 4:05 PM   Clinical Narrative:    NCM spoke with patient at bedside, offered choice, she states she is active with [facility] and would like to continue with [facility] but would like to have [place] and [facility], she does not want HHPT because she feels like she already knows what to do because she just had hhpt before she cam back hospital.     Final next level of care: [place] Barriers to Discharge: Continued Medical Work up   Patient Goals and CMS Choice Patient states their goals for this hospitalization and ongoing recovery are:: be obediant and do what she is suppose to do. CMS Medicare.gov Compare Post Acute Care list provided to:: Patient Choice offered to / list presented to : Patient  Discharge Placement                       Discharge Plan and Services                  DME Agency: NA       HH Arranged: OT,Nurse's Aide [place] Agency: [facility] Date [place]: 09/11/20 Time [place]: [place] Representative spoke with at [place]: Amy  Social Determinants of Health ([place]) Interventions     Readmission Risk Interventions No flowsheet data found.

## 2020-09-12 LAB — CBC
HCT: 30.9 % — ABNORMAL LOW (ref 36.0–46.0)
Hemoglobin: 9.9 g/dL — ABNORMAL LOW (ref 12.0–15.0)
MCH: 26.1 pg (ref 26.0–34.0)
MCHC: 32 g/dL (ref 30.0–36.0)
MCV: 81.5 fL (ref 80.0–100.0)
Platelets: 286 10*3/uL (ref 150–400)
RBC: 3.79 MIL/uL — ABNORMAL LOW (ref 3.87–5.11)
RDW: 16.6 % — ABNORMAL HIGH (ref 11.5–15.5)
WBC: 11.2 10*3/uL — ABNORMAL HIGH (ref 4.0–10.5)
nRBC: 0 % (ref 0.0–0.2)

## 2020-09-12 LAB — BASIC METABOLIC PANEL
Anion gap: 7 (ref 5–15)
BUN: 10 mg/dL (ref 8–23)
CO2: 32 mmol/L (ref 22–32)
Calcium: 9.3 mg/dL (ref 8.9–10.3)
Chloride: 101 mmol/L (ref 98–111)
Creatinine, Ser: 0.67 mg/dL (ref 0.44–1.00)
GFR, Estimated: >60 mL/min (ref >60)
Glucose, Bld: 116 mg/dL — ABNORMAL HIGH (ref 70–99)
Potassium: 4.1 mmol/L (ref 3.5–5.1)
Sodium: 140 mmol/L (ref 135–145)

## 2020-09-12 MED ORDER — PREDNISONE 50 MG PO TABS: 50.0000 mg | ORAL_TABLET | Freq: Every day | ORAL | Status: DC

## 2020-09-12 MED ORDER — PREDNISONE 20 MG PO TABS: 20.0000 mg | ORAL_TABLET | Freq: Every day | ORAL | Status: DC

## 2020-09-12 MED ORDER — PREDNISONE 10 MG PO TABS: 10.0000 mg | ORAL_TABLET | Freq: Every day | ORAL | Status: DC

## 2020-09-12 MED ORDER — PREDNISONE 5 MG PO TABS: 2.5000 mg | ORAL_TABLET | Freq: Every day | ORAL | Status: DC

## 2020-09-12 MED ORDER — PREDNISONE 50 MG PO TABS
60.0000 mg | ORAL_TABLET | Freq: Every day | ORAL | Status: AC
  Administered 2020-09-13 – 2020-09-15 (×3): 60 mg via ORAL
  Filled 2020-09-12 (×3): qty 1

## 2020-09-12 MED ORDER — PREDNISONE 20 MG PO TABS: 30.0000 mg | ORAL_TABLET | Freq: Every day | ORAL | Status: DC

## 2020-09-12 MED ORDER — PREDNISONE 5 MG PO TABS: 5.0000 mg | ORAL_TABLET | Freq: Every day | ORAL | Status: DC

## 2020-09-12 MED ORDER — PREDNISONE 1 MG PO TABS: 1.0000 mg | ORAL_TABLET | Freq: Every day | ORAL | Status: DC

## 2020-09-12 MED ORDER — PREDNISONE 20 MG PO TABS: 40.0000 mg | ORAL_TABLET | Freq: Every day | ORAL | Status: DC

## 2020-09-12 NOTE — Progress Notes (Signed)
Physical Therapy Treatment Patient Details Name: Mary Jenkins MRN: [id number] DOB: 05/27/1956 Today's Date: 09/12/2020    History of Present Illness 64 yo female presenting to ED via EMS with acute respiratory failure. Placed on BiPAP. Recent admission 07/2020 with T11 compression fx and requires lumbar corset for mobility. PMH including COPD on 4L home O2, DDD cervical, cataracts, breast cancer, and Anemia.    PT Comments    Pt making steady progress towards her physical therapy goals. Session focused on seated exercises for BLE strengthening, sit to stands, and functional mobility. Pt ambulating 150 feet with no assistive device at a supervision level. SpO2 98-100% on 4L O2, HR 87-131 bpm, BP 115/94 post mobility. Will continue to progress as tolerated.    Follow Up Recommendations  Supervision for mobility/OOB;No PT follow up (pt declining f/u HHPT)     Equipment Recommendations  None recommended by PT    Recommendations for Other Services       Precautions / Restrictions Precautions Precautions: Back Precaution Booklet Issued: No Precaution Comments: Reveiwed back precautions; pt able to recall with increased time Required Braces or Orthoses: Spinal Brace Spinal Brace: Lumbar corset;Applied in sitting position Restrictions Weight Bearing Restrictions: No    Mobility  Bed Mobility Overal bed mobility: Modified Independent                Transfers Overall transfer level: Independent Equipment used: None                Ambulation/Gait Ambulation/Gait assistance: Supervision Gait Distance (Feet): 150 Feet Assistive device: None Gait Pattern/deviations: Step-through pattern;Decreased stride length;Narrow base of support Gait velocity: decreased   General Gait Details: Supervision for safety, mild dynamic instability   Stairs             Wheelchair Mobility    Modified Rankin (Stroke Patients Only)       Balance Overall balance  assessment: Needs assistance Sitting-balance support: No upper extremity supported;Feet supported Sitting balance-Leahy Scale: Good     Standing balance support: No upper extremity supported;During functional activity Standing balance-Leahy Scale: Fair                              Cognition Arousal/Alertness: Awake/alert Behavior During Therapy: WFL for tasks assessed/performed Overall Cognitive Status: Within Functional Limits for tasks assessed                                        Exercises General Exercises - Lower Extremity Long Arc Quad: Both;10 reps;Seated Hip Flexion/Marching: Both;10 reps;Seated Other Exercises Other Exercises: x5 Sit to Stands    General Comments        Pertinent Vitals/Pain Pain Assessment: Faces Faces Pain Scale: Hurts a little bit Pain Location: Back Pain Descriptors / Indicators: Discomfort Pain Intervention(s): Monitored during session    Home Living                      Prior Function            PT Goals (current goals can now be found in the care plan section) Acute Rehab PT Goals Patient Stated Goal: Go home when I am better PT Goal Formulation: With patient Time For Goal Achievement: 09/25/20 Potential to Achieve Goals: Good Progress towards PT goals: Progressing toward goals    Frequency    Min  3X/week      PT Plan Current plan remains appropriate    Co-evaluation              AM-PAC PT "6 Clicks" Mobility   Outcome Measure  Help needed turning from your back to your side while in a flat bed without using bedrails?: None Help needed moving from lying on your back to sitting on the side of a flat bed without using bedrails?: None Help needed moving to and from a bed to a chair (including a wheelchair)?: None Help needed standing up from a chair using your arms (e.g., wheelchair or bedside chair)?: None Help needed to walk in hospital room?: A Little Help needed climbing  3-5 steps with a railing? : A Little 6 Click Score: 22    End of Session Equipment Utilized During Treatment: Oxygen Activity Tolerance: Patient tolerated treatment well Patient left: with call bell/phone within reach;Other (comment);with family/visitor present (seated EOB) Nurse Communication: Mobility status PT Visit Diagnosis: Unsteadiness on feet (R26.81);Difficulty in walking, not elsewhere classified (R26.2)     Time: 8377-9396 PT Time Calculation (min) (ACUTE ONLY): 18 min  Charges:  $Therapeutic Activity: 8-22 mins                     Wyona Almas, PT, DPT Acute Rehabilitation Services Pager [phone] Office [phone]    Deno Etienne 09/12/2020, 10:04 AM

## 2020-09-12 NOTE — Progress Notes (Signed)
Family Medicine Teaching Service Daily Progress Note Intern Pager: [phone]  Patient name: Mary Jenkins Medical record number: [id number] Date of birth: [birth year] Age: 64 y.o. Gender: female  Primary Care Provider: Matilde Haymaker, MD Consultants: none Code Status: full  Pt Overview and Major Events to Date:  12/18: Admitted  Assessment and Plan: Fiorella Hanahan Scottis a 65 y.o.femalewithPMH significant forCOPDGOLD IV, Anemia, MDD, GERD, H/O breast Cancer,h/o thoracic vertebral compression fx,Cervical spine arthritis and Cataract.  Acute respiratory distress 2/2 COPDGOLD IV exacerbation  slightly improved symptoms today.  Decreased air movement but no wheezing on exam.  Sats 100% on 4L. At home she uses 3-4 L and is usually satting > 95%.  - stopping CTX  - Azithromycin 564m daily (12/19-12/23) - Continue albuterol neb Q4PRN - Methylprednisolone 686mq12h. Last dose this afternoon. (day 2/5) - switch to prednisone taper tomorrow.  Taper over two weeks total. - Breo Ellipta, Incruse Ellipta 1 puff daily - Daliresp 5033mdaily  - Bipap PRN and wean as tolerated - Maintain O2 saturation88-92% - PT/OTeval and treat - Speak with her pulmonologist before D/c re f/u appointment   Vertebral compression fx Associated with osteopenia. Dx on recent admission 07/2020 and still undergoing pain treatment and TSLO brace. Following up with neurosurgery OP. - Continue salmon calcitonin nasal spray as respiratory status will allow - discuss w/ PCP long term bisphosphate treatment on followup.   - Tylenol 1000m20mPRN - Tramadol 50mg33mRN - Encourage use of incentive spirometer  NormocyticAnemia, chronic, stable  Hgb 12/19 10.3Likely anemia of chronic disease.  - Continueferrous sulfate325 mg daily. - Monitor with CBC tomorrow  GERD,chronic, stable - Continue Protonix 40mg 57my   H/o Breast cancer Prior right mastectomy with reconstruction and 6 months of  chemo. No current oncologist  FEN/GI: Normal diet PPx: Lovenox   Disposition: home  Subjective:  Pt states her breathing is better today.  States she took he bipap off last night but feels she should have left it on. Pt has no questions.  She usually sats in the mid to high 90s at home on 3-4L.  She has never tried titrating down.    Objective: Temp:  [98 F (36.7 C)-98.5 F (36.9 C)] 98.5 F (36.9 C) (12/21 0044) Pulse Rate:  [65-98] 65 (12/21 0400) Resp:  [14-21] 14 (12/21 0400) BP: (106-127)/(57-93) 108/65 (12/21 0400) SpO2:  [100 %] 100 % (12/21 0400) FiO2 (%):  [36 %] 36 % (12/20 1419) Weight:  [46.6 kg] 46.6 kg (12/21 0129) Physical Exam: General: alert, oriented.  No acute distress.  Cardiovascular: RRR.  No murmurs.  Respiratory: decreased air movement diffusely.  No wheezes or crackles.   Abdomen: soft, nontender.   Extremities: no edema.    Laboratory: Recent Labs  Lab 09/09/20 1838 09/10/20 0345 09/12/20 0305  WBC 8.8 7.9 11.2*  HGB 11.3* 10.3* 9.9*  HCT 38.4 34.0* 30.9*  PLT 378 297 286   Recent Labs  Lab 09/09/20 1838 09/10/20 0345 09/12/20 0305  NA 138 139 140  K 4.1 4.5 4.1  CL 102 101 101  CO2 23 25 32  BUN _0 CREATININE 0.89 0.73 0.67  CALCIUM 9.3 9.2 9.3  PROT 6.8  --   --   BILITOT 0.6  --   --   ALKPHOS 68  --   --   ALT 13  --   --   AST 23  --   --   GLUCOSE 218* 149* 116*  Benay Pike, MD 09/12/2020, 7:54 AM PGY-3, [place] Intern pager: [phone], text pages welcome

## 2020-09-13 LAB — CBC
HCT: 34 % — ABNORMAL LOW (ref 36.0–46.0)
Hemoglobin: 10.3 g/dL — ABNORMAL LOW (ref 12.0–15.0)
MCH: 25.4 pg — ABNORMAL LOW (ref 26.0–34.0)
MCHC: 30.3 g/dL (ref 30.0–36.0)
MCV: 84 fL (ref 80.0–100.0)
Platelets: 285 10*3/uL (ref 150–400)
RBC: 4.05 MIL/uL (ref 3.87–5.11)
RDW: 16.5 % — ABNORMAL HIGH (ref 11.5–15.5)
WBC: 10.3 10*3/uL (ref 4.0–10.5)
nRBC: 0 % (ref 0.0–0.2)

## 2020-09-13 NOTE — Care Management Important Message (Signed)
Important Message  Patient Details  Name: Naika Noto MRN: [id number] Date of Birth: 1956/05/05   Medicare Important Message Given:  Yes     Shelda Altes 09/13/2020, 10:01 AM

## 2020-09-13 NOTE — Progress Notes (Signed)
Family Medicine Teaching Service Daily Progress Note Intern Pager: [phone]  Patient name: Mary Jenkins Medical record number: [id number] Date of birth: August 24, 1956 Age: 64 y.o. Gender: female  Primary Care Provider: Matilde Haymaker, MD Consultants: none Code Status: full  Pt Overview and Major Events to Date:  12/18: Admitted  Assessment and Plan: [name] a 64 y.o.femalewithPMH significant forCOPDGOLD IV, Anemia, MDD, GERD, H/O breast Cancer,h/o thoracic vertebral compression fx,Cervical spine arthritis and Cataract.  Acute respiratory distress 2/2 COPDGOLD IV exacerbation  better today. . Sats 100% on 4L. At home she uses 3-4 L and is usually satting > 95%.  - Azithromycin 554m daily (12/19-12/23) - Continue albuterol neb Q4PRN - starting taper with prednisone today for 15 days  total.  - Breo Ellipta, Incruse Ellipta 1 puff daily - Daliresp 557m daily  - Bipap PRN and wean as tolerated - Maintain O2 saturation88-92% - PT/OTeval and treat - pt declining HHPT - Speak with her pulmonologist before D/c re f/u appointment   Vertebral compression fx Associated with osteopenia. Dx on recent admission 07/2020 and still undergoing pain treatment and TSLO brace. Following up with neurosurgery OP. - Continue salmon calcitonin nasal spray as respiratory status will allow - discuss w/ PCP long term bisphosphate treatment on followup.   - Tylenol 100064m8PRN - Tramadol 1m79mPRN - Encourage use of incentive spirometer  NormocyticAnemia, chronic, stable  Hgb 12/19 10.3Likely anemia of chronic disease.  - Continueferrous sulfate325 mg daily. - Monitor with CBC tomorrow  GERD,chronic, stable - Continue Protonix 40mg69mly   H/o Breast cancer Prior right mastectomy with reconstruction and 6 months of chemo. No current oncologist  FEN/GI: Normal diet PPx: Lovenox   Disposition: home  Subjective:  Pt feels better today.  She had to use  the bipap yesterday evening but took it off and increased her oxygen to 5L and feels fine now.  Discussed the goal oxygen saturation for someone with copd with patient.  Pt also states she needs a referral for supervision at home when her daughter is working at night.    Objective: Temp:  [97.7 F (36.5 C)-98.8 F (37.1 C)] 98.8 F (37.1 C) (12/22 0742) Pulse Rate:  [84-102] 97 (12/22 0743) Resp:  [12-20] 12 (12/22 0743) BP: (103-121)/(70-93) 110/73 (12/22 0742) SpO2:  [100 %] 100 % (12/22 0743) Weight:  [46.4 kg] 46.4 kg (12/22 0503) Physical Exam: General: alert oriented.  No acute distress.   Cardiovascular: RRR. No murmurs.  Respiratory: decreased air movement.  Diffusely.  No wheezes.     Abdomen: soft, nontender.   Extremities: no edema.    Laboratory: Recent Labs  Lab 09/10/20 0345 09/12/20 0305 09/13/20 0331  WBC 7.9 11.2* 10.3  HGB 10.3* 9.9* 10.3*  HCT 34.0* 30.9* 34.0*  PLT 297 286 285   Recent Labs  Lab 09/09/20 1838 09/10/20 0345 09/12/20 0305  NA 138 139 140  K 4.1 4.5 4.1  CL 102 101 101  CO2 23 25 32  BUN _0 CREATININE 0.89 0.73 0.67  CALCIUM 9.3 9.2 9.3  PROT 6.8  --   --   BILITOT 0.6  --   --   ALKPHOS 68  --   --   ALT 13  --   --   AST 23  --   --   GLUCOSE 218* 149* 116*     OlsonBenay Pike12/22/2021, 8:00 AM PGY-3, [facility] [place] pager: [phone] pages welcome

## 2020-09-13 NOTE — Discharge Summary (Signed)
Mary Jenkins  Patient name: Mary Jenkins Medical record number: [id number] Date of birth: [birth year] Age: 64 y.o. Gender: female Date of Admission: 09/09/2020  Date of Discharge: 09/15/20 Admitting Physician: Richarda Osmond, DO  Primary Care Provider: Matilde Haymaker, MD Consultants: None  Indication for Hospitalization: Acute hypoxic respiratory distress 2/2 COPD exacerbation  Discharge Diagnoses/Problem List:  Acute hypoxic respiratory failure COPD exacerbation Vertebral fracture Anxiety  Normocytic Anemia  GERD  Disposition: Home  Discharge Condition: Stable  Discharge Exam:  General: female appearing stated age in no acute distress Cardio: Normal S1 and S2, no S3 or S4. Rhythm is regular. No murmurs or rubs.  Bilateral radial pulses palpable Pulm: restricted aeration, no crackles, no wheezing, not in acute respiratory distress, increased WOB with deep breathing otherwise comfortable while sitting in bed with [place] 4 L in place  Abdomen: Bowel sounds normal. Abdomen soft and non-tender, nondistended.  Extremities: No peripheral edema. Warm/ well perfused.  Neuro: pt alert and oriented x4    Brief Hospital Course:  Mary Jenkins is a 64 y.o. female who presented with acute respiratory distress 2/2 COPD exacerbation. PMH significant for COPD GOLD IV, Anemia, MDD, GERD, H/O breast Cancer, h/o thoracic vertebral compression fx, Cervical spine arthritis and Cataract. Below is her hospital course listed by problem. Refer to the H&P for additional information.   Acute Respiratory Distress 2/2 COPD Exacerbation Patient admitted with tachypnea and respiratory distress presumed to be a COPD exacerbation. CXR negative for acute findings, negative D-dimer, patient afebrile with normal WBC and negative respiratory panel; LA elevated to 2.9. In the ED, patient was given 2g of magnesium and started on BiPAP, CTX (12/18-12/21), and  Azithromycin (12/19-12/23). Started on steroids and discharged with prolonged 2-week steroid taper. Home COPD medications were continued. Patient was weaned as tolerated to her baseline 3-4L.   Vertebral Compression Fx Patient was continued on salmon calcitonin nasal spray. Pain control additionally with Tylenol and Tramadol. Evaluated and treated by PT and OT while hospitalized. Recommended for home heatlh OT/supervision-assistance 24 hour. Patient apparently denied home health PT.   Other chronic problems were stable and treated with home medications as appropriate.  Anxiety She was seen by her PCP in the inpatient service and discussed symptoms of anxiety.  She is previously been on mirtazapine at night but is not currently taking any mirtazapine.  We will plan to send home with a short supply of Atarax and discuss restarting mirtazapine or an SSRI in the outpatient setting.  Issues for Follow-up 1. Consider adding LAMA in addition to LABA due to severity  2. Monitor home oxygen requirements 3. Schedule follow-up appointment with PCP to discuss anxiety 4. Consider bisphophonates orally, fosomax weekly. Does not need DEXA scan, has diagnostic fracture 5. Ensure close follow up with Dr Melvyn Novas, Pulmonologist. Next f/u is on Jan 31st. 6. Chronic care mgmt referral for sitter while daughter is working.    Significant Procedures: None  Significant Labs and Imaging:  Recent Labs  Lab 09/10/20 0345 09/12/20 0305 09/13/20 0331  WBC 7.9 11.2* 10.3  HGB 10.3* 9.9* 10.3*  HCT 34.0* 30.9* 34.0*  PLT 297 286 285   Recent Labs  Lab 09/09/20 1838 09/10/20 0345 09/12/20 0305  NA 138 139 140  K 4.1 4.5 4.1  CL 102 101 101  CO2 23 25 32  GLUCOSE 218* 149* 116*  BUN _0 CREATININE 0.89 0.73 0.67  CALCIUM 9.3 9.2 9.3  ALKPHOS 68  --   --  AST 23  --   --   ALT 13  --   --   ALBUMIN 3.6  --   --    D-dimer 09/10/20: 0.41 (WNL)  DG Chest 1-View 08/30/20 FINDINGS: Surgical clips  overlie the right axilla. Right breast prosthesis. Stable cardiomediastinal silhouette with normal heart size. No pneumothorax. No pleural effusion. Hyperinflated lungs and severe emphysema. No pulmonary edema. No acute consolidative airspace disease. IMPRESSION: Hyperinflated lungs and severe emphysema, compatible with reported COPD. No acute cardiopulmonary disease.   Results/Tests Pending at Time of Discharge: None  Discharge Medications:  Allergies as of 09/15/2020   No Known Allergies     Medication List    TAKE these medications   albuterol 108 (90 Base) MCG/ACT inhaler Commonly known as: ProAir HFA Inhale 2 puffs into the lungs every 6 (six) hours as needed for wheezing or shortness of breath.   albuterol (2.5 MG/3ML) 0.083% nebulizer solution Commonly known as: PROVENTIL Take 3 mLs (2.5 mg total) by nebulization every 6 (six) hours as needed for wheezing or shortness of breath.   aspirin EC 81 MG tablet Take 81 mg by mouth daily. Swallow whole.   Breztri Aerosphere 160-9-4.8 MCG/ACT Aero Generic drug: Budeson-Glycopyrrol-Formoterol Inhale 2 puffs into the lungs 2 (two) times daily.   calcitonin (salmon) 200 UNIT/ACT nasal spray Commonly known as: MIACALCIN/FORTICAL Place 1 spray into alternate nostrils daily.   cycloSPORINE 0.05 % ophthalmic emulsion Commonly known as: RESTASIS Place 1 drop into both eyes 2 (two) times daily.   ferrous sulfate 324 (65 Fe) MG Tbec Take 1 tablet (325 mg total) by mouth daily. OK to take every other day if it is constipating. What changed:   when to take this  additional instructions   hydrOXYzine 25 MG tablet Commonly known as: ATARAX/VISTARIL Take 1 tablet (25 mg total) by mouth every 6 (six) hours as needed for anxiety.   Narcan 4 MG/0.1ML Liqd nasal spray kit Generic drug: naloxone Place 1 spray into the nose once as needed (opioid overdose).   oxyCODONE 5 MG immediate release tablet Commonly known as: Oxy  IR/ROXICODONE Take 5 mg by mouth every 4 (four) hours as needed for severe pain.   OXYGEN Place 4 L into the nose continuous.   pantoprazole 40 MG tablet Commonly known as: PROTONIX Take 1 tablet (40 mg total) by mouth daily.   predniSONE 20 MG tablet Commonly known as: Deltasone 12/25 take 2 and a half tablets (49m); 12/26 take 2 tablets (414m; 12/27 take 1 and a half tablets (308m 12/28 take 1 tablet (59m43m12/29 take half a tablet (10mg62mpredniSONE 1 MG tablet Commonly known as: DELTASONE 09/21/20 take 5 tablets (5mg);75m/31/21 and 09/23/20 take two and a half tablets (2.5mg); 23m/22 and 09/25/20 take one tablet (1mg); T47m stop   roflumilast 500 MCG Tabs tablet Commonly known as: DALIRESP Take 1 tablet (500 mcg total) by mouth daily.   valACYclovir 1000 MG tablet Commonly known as: VALTREX TAKE 1 TABLET BY MOUTH TWICE DAILY FOR 3 DAYS AS NEEDED What changed:   how much to take  how to take this  when to take this  additional instructions       Discharge Instructions: Please refer to Patient Instructions section of EMR for full details.  Patient was counseled important signs and symptoms that should prompt return to medical care, changes in medications, dietary instructions, activity restrictions, and follow up appointments.   Follow-Up Appointments:  Follow-up Information    [facility]   Why: HHOT, [facility] Contact information: [place]  [place]. Go on 09/25/2020.   Why: Please arrive by 9:55 AM for your appt  Contact information: [place]       Tanda Rockers, MD. Go on 10/23/2020.   Specialty: Pulmonary Disease Why: Please arrive by 1:15 PM for you appt  Contact information: [place] [address] [phone]               Eulis Foster, MD 09/15/2020, 1:10 PM PGY-2, [place]

## 2020-09-13 NOTE — Progress Notes (Signed)
Occupational Therapy Treatment Patient Details Name: Mary Jenkins MRN: [id number] DOB: 08-04-1956 Today's Date: 09/13/2020    History of present illness 64 yo female presenting to ED via EMS with acute respiratory failure. Placed on BiPAP. Recent admission 07/2020 with T11 compression fx and requires lumbar corset for mobility. PMH including COPD on 4L home O2, DDD cervical, cataracts, breast cancer, and Anemia.   OT comments  Patient making steady progress to all goal areas.  Primary deficit is DOE and decreased activity tolerance.  She is needing setup and supervision with LB ADL sit/stand level.  Patient able to sit figure four to complete dressing.  She did not require an assistive device for mobility, but did need 3 seated rest breaks.  O2 and HR monitored.  Patient will continue to be seen in the acute setting.  [place] OT has been recommended.    Follow Up Recommendations  Home health OT;Supervision/Assistance - 24 hour    Equipment Recommendations  None recommended by OT    Recommendations for Other Services      Precautions / Restrictions Precautions Precautions: Back Required Braces or Orthoses: Spinal Brace Spinal Brace: Lumbar corset;Applied in sitting position Restrictions Weight Bearing Restrictions: No       Mobility Bed Mobility   Bed Mobility: Rolling Rolling: Modified independent (Device/Increase time)            Transfers Overall transfer level: Needs assistance Equipment used: None Transfers: Sit to/from Stand Sit to Stand: Supervision              Balance   Sitting-balance support: No upper extremity supported;Feet supported Sitting balance-Leahy Scale: Good     Standing balance support: No upper extremity supported;During functional activity Standing balance-Leahy Scale: Fair                             ADL either performed or assessed with clinical judgement   ADL                       Lower Body Dressing:  Sit to/from stand;Supervision/safety   Toilet Transfer: Supervision/safety;Ambulation   Toileting- Clothing Manipulation and Hygiene: Independent;Sit to/from stand       Functional mobility during ADLs: Supervision/safety                                                                                     General Comments HR up 132 BPM with mobility.  O2 sats 99% on 5L O2 via [place]    Pertinent Vitals/ Pain       Faces Pain Scale: Hurts a little bit Pain Location: Back Pain Descriptors / Indicators: Discomfort Pain Intervention(s): Monitored during session;Repositioned                                                          Frequency  Min 2X/week        Progress Toward Goals  OT Goals(current goals  can now be found in the care plan section)  Progress towards OT goals: Progressing toward goals  Acute Rehab OT Goals Patient Stated Goal: I think I'm going to need some help at home OT Goal Formulation: With patient Time For Goal Achievement: 09/25/20 Potential to Achieve Goals: Good  Plan Discharge plan remains appropriate    Co-evaluation                 AM-PAC OT "6 Clicks" Daily Activity     Outcome Measure   Help from another person eating meals?: None Help from another person taking care of personal grooming?: A Little Help from another person toileting, which includes using toliet, bedpan, or urinal?: A Little Help from another person bathing (including washing, rinsing, drying)?: A Little Help from another person to put on and taking off regular upper body clothing?: A Little Help from another person to put on and taking off regular lower body clothing?: A Little 6 Click Score: 19    End of Session Equipment Utilized During Treatment: Oxygen  OT Visit Diagnosis: Unsteadiness on feet (R26.81);Other abnormalities of gait and mobility (R26.89);Muscle weakness (generalized) (M62.81);Pain    Activity Tolerance Patient limited by fatigue   Patient Left in chair;with call bell/phone within reach   Nurse Communication          Time: [phone] OT Time Calculation (min): 19 min  Charges: OT General Charges $OT Visit: 1 Visit OT Treatments $Self Care/Home Management : 8-22 mins  09/13/2020  Rich, OTR/L  Acute Rehabilitation Services  Office:  [phone]    Metta Clines 09/13/2020, 10:43 AM

## 2020-09-13 NOTE — Progress Notes (Signed)
Nutrition Follow-up  DOCUMENTATION CODES:   Underweight,Severe malnutrition in context of chronic illness  INTERVENTION:   -Continue Ensure Enlive po TID, each supplement provides 350 kcal and 20 grams of protein -Continue MVI with minerals daily  NUTRITION DIAGNOSIS:   Severe Malnutrition related to chronic illness (COPD) as evidenced by severe fat depletion,severe muscle depletion,energy intake < 75% for > or equal to 1 month.  Ongoing  GOAL:   Patient will meet greater than or equal to 90% of their needs  Progressing   MONITOR:   PO intake,Supplement acceptance,Labs,Weight trends,Skin,I & O's  REASON FOR ASSESSMENT:   Consult Assessment of nutrition requirement/status  ASSESSMENT:   Mary Jenkins is a 64 y.o. female with PMH significant for COPD GOLD IV, Anemia, MDD, GERD, H/O breast Cancer, h/o thoracic vertebral compression fx, Cervical spine arthritis and Cataract.  Reviewed I/O's: -140 ml x 24 hours and -194 ml since admission  UOP: 600 ml x 24 hours  Per MD notes, pt required Bi-pap yesterday, however, now maintaining saturations on oxygen. Pt is refusing home health services.   Spoke with RN, who reports pt with fair appetite (documented meal completions 50-100%). Rn confirms pt is drinking her Ensure supplements.   Medications reviewed and include ferrous sulfate and prednisone.   Labs reviewed.   Diet Order:   Diet Order            Diet regular Room service appropriate? Yes; Fluid consistency: Thin  Diet effective now                 EDUCATION NEEDS:   Education needs have been addressed  Skin:  Skin Assessment: Reviewed RN Assessment  Last BM:  09/11/20  Height:   Ht Readings from Last 1 Encounters:  09/10/20 _0  (1.676 m)    Weight:   Wt Readings from Last 1 Encounters:  09/13/20 46.4 kg    Ideal Body Weight:  59.1 kg  BMI:  Body mass index is 16.5 kg/m.  Estimated Nutritional Needs:   Kcal:   1700-1900  Protein:  90-105 grams  Fluid:  > 1.7 L    Loistine Chance, RD, LDN, [place] Registered Dietitian II Certified Diabetes Care and Education Specialist Please refer to [facility] for RD and/or RD on-call/weekend/after hours pager

## 2020-09-14 MED ORDER — ONDANSETRON HCL 4 MG/2ML IJ SOLN: 4.0000 mg | Freq: Three times a day (TID) | INTRAMUSCULAR | Status: DC | PRN

## 2020-09-14 NOTE — Progress Notes (Addendum)
Physical Therapy Treatment Patient Details Name: Mary Jenkins MRN: [id number] DOB: [birth year] Today's Date: 09/14/2020    History of Present Illness 64 yo female presenting to ED via EMS with acute respiratory failure. Placed on BiPAP. Recent admission 07/2020 with T11 compression fx and requires lumbar corset for mobility. PMH including COPD on 4L home O2, DDD cervical, cataracts, breast cancer, and Anemia.    PT Comments    Pt met her physical therapy goals during her inpatient stay. Ambulating 250 feet with no assistive device or physical assist. Per pt request, pt bumped up to 4L O2 with mobility where she maintained 100% SpO2, HR peak 136 bpm. Pt with good self awareness of deficits and adapting for compensation if necessary. No further acute PT needs. Thank you for this consult.     Follow Up Recommendations  Supervision for mobility/OOB;No PT follow up (pt declining f/u HHPT)     Equipment Recommendations  None recommended by PT    Recommendations for Other Services       Precautions / Restrictions Precautions Precautions: Back Precaution Booklet Issued: No Required Braces or Orthoses: Spinal Brace Spinal Brace: Lumbar corset;Applied in sitting position Restrictions Weight Bearing Restrictions: No    Mobility  Bed Mobility Overal bed mobility: Modified Independent                Transfers Overall transfer level: Independent Equipment used: None                Ambulation/Gait Ambulation/Gait assistance: Modified independent (Device/Increase time) ((increased time)) Gait Distance (Feet): 250 Feet Assistive device: None Gait Pattern/deviations: Step-through pattern;Decreased stride length;Narrow base of support Gait velocity: decreased   General Gait Details: Slow and steady pace, no physical assist required   Stairs             Wheelchair Mobility    Modified Rankin (Stroke Patients Only)       Balance Overall balance  assessment: Needs assistance Sitting-balance support: No upper extremity supported;Feet supported Sitting balance-Leahy Scale: Good     Standing balance support: No upper extremity supported;During functional activity Standing balance-Leahy Scale: Good                              Cognition Arousal/Alertness: Awake/alert Behavior During Therapy: WFL for tasks assessed/performed Overall Cognitive Status: Within Functional Limits for tasks assessed                                        Exercises      General Comments        Pertinent Vitals/Pain Pain Assessment: Faces Faces Pain Scale: No hurt    Home Living                      Prior Function            PT Goals (current goals can now be found in the care plan section) Acute Rehab PT Goals Patient Stated Goal: Go home when I am better PT Goal Formulation: With patient Time For Goal Achievement: 09/25/20 Potential to Achieve Goals: Good Progress towards PT goals: Goals met/education completed, patient discharged from PT    Frequency    Min 3X/week      PT Plan Current plan remains appropriate    Co-evaluation  AM-PAC PT "6 Clicks" Mobility   Outcome Measure  Help needed turning from your back to your side while in a flat bed without using bedrails?: None Help needed moving from lying on your back to sitting on the side of a flat bed without using bedrails?: None Help needed moving to and from a bed to a chair (including a wheelchair)?: None Help needed standing up from a chair using your arms (e.g., wheelchair or bedside chair)?: None Help needed to walk in hospital room?: None Help needed climbing 3-5 steps with a railing? : A Little 6 Click Score: 23    End of Session Equipment Utilized During Treatment: Oxygen Activity Tolerance: Patient tolerated treatment well Patient left: in chair Nurse Communication: Mobility status PT Visit Diagnosis:  Unsteadiness on feet (R26.81);Difficulty in walking, not elsewhere classified (R26.2)     Time: 0960-4540 PT Time Calculation (min) (ACUTE ONLY): 25 min  Charges:  $Therapeutic Activity: 23-37 mins                     Wyona Almas, PT, DPT Acute Rehabilitation Services Pager [phone] Office [phone]    Deno Etienne 09/14/2020, 10:34 AM

## 2020-09-14 NOTE — Progress Notes (Signed)
Family Medicine Teaching Service Daily Progress Note Intern Pager: [phone]  Patient name: Mary Jenkins Medical record number: [id number] Date of birth: 1956/02/04 Age: 64 y.o. Gender: female  Primary Care Provider: Matilde Haymaker, MD Consultants: None Code Status: full  Pt Overview and Major Events to Date:  12/18: Admitted  Assessment and Plan: Lauria Depoy Scottis a 64 y.o.femalewithPMH significant forCOPDGOLD IV, Anemia, MDD, GERD, H/O breast Cancer,h/o thoracic vertebral compression fx,Cervical spine arthritis and Cataract.  Acute respiratory distress 2/2 COPDGOLD IV exacerbation, home oxygen 3-4L Pt is now off BiPAP, feeling better compared to admission but would like to stay another day to prevent readmission. Sats 100% on 5L today. I turned her oxygen down to 3L and sats were 100%. She said she needs 5L on ambulation. On exam: increased WOB which is patient's baseline, reduced AE as bases but improved compared to previous exam. -Continue Bipap PRN and wean as tolerated - Maintain O2 saturation88-92% - Azithromycin 589m daily (12/19-12/23) -Continue albuterol neb Q4PRN -Continue prednisone taper for 15 days, finishing on 09/24/20. - Breo Ellipta, Incruse Ellipta 1 puff daily - Daliresp 5074m daily  - PT/OTeval and treat - pt declining HHPT  Vertebral compression fx Associated with osteopenia. Dx on recent admission 07/2020 and still undergoing pain treatment and TSLO brace. Following up with neurosurgery OP. - Continue salmon calcitonin nasal spray as respiratory status will allow - Discuss w/ PCP long term bisphosphate treatment on follow up.   - Tylenol 10007m8PRN - Tramadol 44m46mPRN - Encourage use of incentive spirometer  NormocyticAnemia, chronic, stable  Hb 10.3 on 12/22.Likely anemia of chronic disease.  -Continueferrous sulfate325 mg daily. - Monitor with CBC   GERD,chronic, stable -Continue Protonix 40mg37mly   H/o Breast  cancer Prior right mastectomy with reconstruction and 6 months of chemo. No current oncologist  FEN/GI: Normal diet PPx: Lovenox   Status is: Inpatient  Remains inpatient appropriate because:Inpatient level of care appropriate due to severity of illness   Dispo: The patient is from: Home              Anticipated d/c is to: Home              Anticipated d/c date is: 1 day              Patient currently is not medically stable to d/c.   Subjective:  Patient improved compared to admission however does not feel she is ready to go home. She would like to stay in another day to make sure she is really ready before to prevent a readmission back to hospital.    Objective: Temp:  [97.7 F (36.5 C)-98.5 F (36.9 C)] 97.7 F (36.5 C) (12/23 0741) Pulse Rate:  [80-98] 98 (12/23 0741) Resp:  [18] 18 (12/22 2103) BP: (95-111)/(59-80) 111/76 (12/23 0741) SpO2:  [100 %] 100 % (12/23 0816)  Physical Exam: General: Alert, able to speak in full sentences, pleasant, nasal cannula 3L Cardio: Normal S1 and S2, RRR, no r/m/g Pulm: increased WOB, reduced AE as bases but improved compared to previous exams, no wheeze or crackles Extremities: No peripheral edema.  Neuro: Cranial nerves grossly intact  Laboratory: Recent Labs  Lab 09/10/20 0345 09/12/20 0305 09/13/20 0331  WBC 7.9 11.2* 10.3  HGB 10.3* 9.9* 10.3*  HCT 34.0* 30.9* 34.0*  PLT 297 286 285   Recent Labs  Lab 09/09/20 1838 09/10/20 0345 09/12/20 0305  NA 138 139 140  K 4.1 4.5 4.1  CL 102 101 101  CO2 23 25 32  BUN _0 CREATININE 0.89 0.73 0.67  CALCIUM 9.3 9.2 9.3  PROT 6.8  --   --   BILITOT 0.6  --   --   ALKPHOS 68  --   --   ALT 13  --   --   AST 23  --   --   GLUCOSE 218* 149* 116*     Imaging/Diagnostic Tests: No results found.  Lattie Haw, MD 09/14/2020, 9:05 AM PGY-2, [place] Intern pager: [phone], text pages welcome

## 2020-09-15 MED ORDER — PREDNISONE 1 MG PO TABS: ORAL_TABLET | ORAL | 0 refills | Status: AC

## 2020-09-15 MED ORDER — PREDNISONE 20 MG PO TABS: ORAL_TABLET | ORAL | 0 refills | Status: AC

## 2020-09-15 NOTE — Discharge Instructions (Signed)
You were admitted to the hospital for a COPD exacerbation and treated with antibiotics, steroids and albuterol to help with your breathing.   We have prescribed continued steroids that will end on 09/24/20. Please follow this taper to try and control the inflammation in your lungs.   We have arranged follow up at the [facility] medicine center on 09/25/20 as well as pulmonary follow up on 10/23/20.

## 2020-09-15 NOTE — TOC Transition Note (Signed)
Transition of Care (TOC) - CM/SW Discharge Note Marvetta Gibbons RN, BSN Transitions of Care Unit 4E- RN Case Manager See Treatment Team for direct phone # [place] coverage 3E   Patient Details  Name: Kamisha Ell MRN: [id number] Date of Birth: 1956-03-25  Transition of Care [facility]) CM/SW Contact:  Dawayne Patricia, RN Phone Number: 09/15/2020, 1:29 PM   Clinical Narrative:    Pt stable for transition home today, [place] services have been arranged with [facility] - orders placed and referral made per previous CM note   Final next level of care: Home w Home Health Services Barriers to Discharge: Barriers Resolved   Patient Goals and CMS Choice Patient states their goals for this hospitalization and ongoing recovery are:: be obediant and do what she is suppose to do. CMS Medicare.gov Compare Post Acute Care list provided to:: Patient Choice offered to / list presented to : Patient  Discharge Placement                       Discharge Plan and Services   Discharge Planning Services: CM Consult Post Acute Care Choice: Home Health            DME Agency: NA       HH Arranged: OT,Nurse's Aide [place] Agency: [facility] Date [place]: 09/11/20 Time [place]: [id number] Representative spoke with at [place]: Amy  Social Determinants of Health ([place]) Interventions     Readmission Risk Interventions Readmission Risk Prevention Plan 09/15/2020  Transportation Screening Complete  PCP or Specialist Appt within 3-5 Days Complete  HRI or [place] Complete  Social Work Consult for [place] Planning/Counseling Complete  Palliative Care Screening Not Applicable  Medication Review Press photographer) Complete  Some recent data might be hidden

## 2020-09-18 ENCOUNTER — Other Ambulatory Visit: Payer: Self-pay | Admitting: Family Medicine

## 2020-09-18 ENCOUNTER — Telehealth: Payer: Self-pay

## 2020-09-18 DIAGNOSIS — J441 Chronic obstructive pulmonary disease with (acute) exacerbation: Secondary | ICD-10-CM

## 2020-09-18 NOTE — Telephone Encounter (Signed)
Patient calls nurse line requesting referral for home health nursing aide services. Patient is requesting to use [facility].   To PCP  Talbot Grumbling, RN

## 2020-09-18 NOTE — Telephone Encounter (Signed)
Order placed

## 2020-09-19 ENCOUNTER — Encounter (HOSPITAL_COMMUNITY): Payer: Self-pay | Admitting: Emergency Medicine

## 2020-09-19 ENCOUNTER — Inpatient Hospital Stay (HOSPITAL_COMMUNITY)
Admission: EM | Admit: 2020-09-19 | Discharge: 2020-10-24 | DRG: 190 | Disposition: E | Payer: Medicare Other | Attending: Family Medicine | Admitting: Family Medicine

## 2020-09-19 DIAGNOSIS — K219 Gastro-esophageal reflux disease without esophagitis: Secondary | ICD-10-CM | POA: Diagnosis present

## 2020-09-19 DIAGNOSIS — Z66 Do not resuscitate: Secondary | ICD-10-CM | POA: Diagnosis present

## 2020-09-19 DIAGNOSIS — Z8249 Family history of ischemic heart disease and other diseases of the circulatory system: Secondary | ICD-10-CM

## 2020-09-19 DIAGNOSIS — Z7952 Long term (current) use of systemic steroids: Secondary | ICD-10-CM

## 2020-09-19 DIAGNOSIS — J441 Chronic obstructive pulmonary disease with (acute) exacerbation: Secondary | ICD-10-CM | POA: Diagnosis not present

## 2020-09-19 DIAGNOSIS — M4850XA Collapsed vertebra, not elsewhere classified, site unspecified, initial encounter for fracture: Secondary | ICD-10-CM | POA: Diagnosis present

## 2020-09-19 DIAGNOSIS — Z87891 Personal history of nicotine dependence: Secondary | ICD-10-CM

## 2020-09-19 DIAGNOSIS — T380X5A Adverse effect of glucocorticoids and synthetic analogues, initial encounter: Secondary | ICD-10-CM | POA: Diagnosis present

## 2020-09-19 DIAGNOSIS — Z515 Encounter for palliative care: Secondary | ICD-10-CM

## 2020-09-19 DIAGNOSIS — R0602 Shortness of breath: Secondary | ICD-10-CM | POA: Diagnosis not present

## 2020-09-19 DIAGNOSIS — R682 Dry mouth, unspecified: Secondary | ICD-10-CM | POA: Diagnosis present

## 2020-09-19 DIAGNOSIS — E876 Hypokalemia: Secondary | ICD-10-CM | POA: Diagnosis present

## 2020-09-19 DIAGNOSIS — D72829 Elevated white blood cell count, unspecified: Secondary | ICD-10-CM | POA: Diagnosis present

## 2020-09-19 DIAGNOSIS — Z833 Family history of diabetes mellitus: Secondary | ICD-10-CM

## 2020-09-19 DIAGNOSIS — J9611 Chronic respiratory failure with hypoxia: Secondary | ICD-10-CM | POA: Diagnosis present

## 2020-09-19 DIAGNOSIS — Z9011 Acquired absence of right breast and nipple: Secondary | ICD-10-CM

## 2020-09-19 DIAGNOSIS — M8580 Other specified disorders of bone density and structure, unspecified site: Secondary | ICD-10-CM | POA: Diagnosis present

## 2020-09-19 DIAGNOSIS — Z9221 Personal history of antineoplastic chemotherapy: Secondary | ICD-10-CM

## 2020-09-19 DIAGNOSIS — Z7982 Long term (current) use of aspirin: Secondary | ICD-10-CM

## 2020-09-19 DIAGNOSIS — F332 Major depressive disorder, recurrent severe without psychotic features: Secondary | ICD-10-CM | POA: Diagnosis present

## 2020-09-19 DIAGNOSIS — Z8049 Family history of malignant neoplasm of other genital organs: Secondary | ICD-10-CM

## 2020-09-19 DIAGNOSIS — J9612 Chronic respiratory failure with hypercapnia: Secondary | ICD-10-CM | POA: Diagnosis present

## 2020-09-19 DIAGNOSIS — E785 Hyperlipidemia, unspecified: Secondary | ICD-10-CM | POA: Diagnosis present

## 2020-09-19 DIAGNOSIS — M5030 Other cervical disc degeneration, unspecified cervical region: Secondary | ICD-10-CM | POA: Diagnosis present

## 2020-09-19 DIAGNOSIS — K5900 Constipation, unspecified: Secondary | ICD-10-CM | POA: Diagnosis not present

## 2020-09-19 DIAGNOSIS — Z823 Family history of stroke: Secondary | ICD-10-CM

## 2020-09-19 DIAGNOSIS — R110 Nausea: Secondary | ICD-10-CM | POA: Diagnosis present

## 2020-09-19 DIAGNOSIS — Z20822 Contact with and (suspected) exposure to covid-19: Secondary | ICD-10-CM | POA: Diagnosis present

## 2020-09-19 DIAGNOSIS — Z79899 Other long term (current) drug therapy: Secondary | ICD-10-CM

## 2020-09-19 DIAGNOSIS — Z853 Personal history of malignant neoplasm of breast: Secondary | ICD-10-CM

## 2020-09-19 DIAGNOSIS — M47812 Spondylosis without myelopathy or radiculopathy, cervical region: Secondary | ICD-10-CM | POA: Diagnosis present

## 2020-09-19 DIAGNOSIS — E7800 Pure hypercholesterolemia, unspecified: Secondary | ICD-10-CM | POA: Diagnosis present

## 2020-09-19 DIAGNOSIS — F419 Anxiety disorder, unspecified: Secondary | ICD-10-CM | POA: Diagnosis present

## 2020-09-19 DIAGNOSIS — D638 Anemia in other chronic diseases classified elsewhere: Secondary | ICD-10-CM | POA: Diagnosis present

## 2020-09-19 DIAGNOSIS — J9621 Acute and chronic respiratory failure with hypoxia: Secondary | ICD-10-CM | POA: Diagnosis present

## 2020-09-19 LAB — BASIC METABOLIC PANEL
Anion gap: 11 (ref 5–15)
BUN: <5 mg/dL — ABNORMAL LOW (ref 8–23)
CO2: 29 mmol/L (ref 22–32)
Calcium: 9 mg/dL (ref 8.9–10.3)
Chloride: 99 mmol/L (ref 98–111)
Creatinine, Ser: 0.72 mg/dL (ref 0.44–1.00)
GFR, Estimated: >60 mL/min (ref >60)
Glucose, Bld: 150 mg/dL — ABNORMAL HIGH (ref 70–99)
Potassium: 3.8 mmol/L (ref 3.5–5.1)
Sodium: 139 mmol/L (ref 135–145)

## 2020-09-19 LAB — CBC
HCT: 35 % — ABNORMAL LOW (ref 36.0–46.0)
Hemoglobin: 11.1 g/dL — ABNORMAL LOW (ref 12.0–15.0)
MCH: 26.4 pg (ref 26.0–34.0)
MCHC: 31.7 g/dL (ref 30.0–36.0)
MCV: 83.1 fL (ref 80.0–100.0)
Platelets: 332 10*3/uL (ref 150–400)
RBC: 4.21 MIL/uL (ref 3.87–5.11)
RDW: 17.2 % — ABNORMAL HIGH (ref 11.5–15.5)
WBC: 12.1 10*3/uL — ABNORMAL HIGH (ref 4.0–10.5)
nRBC: 0 % (ref 0.0–0.2)

## 2020-09-19 NOTE — ED Triage Notes (Signed)
Pt arrives via [facility] for increasing sob, recently discharged on 12/24 with pna. Diminished lung sounds, wears 4L at baseline, sats 100%. 20gIV forearm, pt used neb/inhaler prior to EMS arrival. Currently on prednisone but no abx.

## 2020-09-20 ENCOUNTER — Emergency Department (HOSPITAL_COMMUNITY): Payer: Medicare Other

## 2020-09-20 DIAGNOSIS — J441 Chronic obstructive pulmonary disease with (acute) exacerbation: Principal | ICD-10-CM

## 2020-09-20 DIAGNOSIS — Z7189 Other specified counseling: Secondary | ICD-10-CM

## 2020-09-20 DIAGNOSIS — J9621 Acute and chronic respiratory failure with hypoxia: Secondary | ICD-10-CM | POA: Diagnosis not present

## 2020-09-20 DIAGNOSIS — R0600 Dyspnea, unspecified: Secondary | ICD-10-CM

## 2020-09-20 DIAGNOSIS — Z66 Do not resuscitate: Secondary | ICD-10-CM | POA: Diagnosis not present

## 2020-09-20 DIAGNOSIS — Z515 Encounter for palliative care: Secondary | ICD-10-CM | POA: Diagnosis not present

## 2020-09-20 LAB — I-STAT ARTERIAL BLOOD GAS, ED
Acid-Base Excess: 10 mmol/L — ABNORMAL HIGH (ref 0.0–2.0)
Bicarbonate: 33.3 mmol/L — ABNORMAL HIGH (ref 20.0–28.0)
Calcium, Ion: 1.16 mmol/L (ref 1.15–1.40)
HCT: 34 % — ABNORMAL LOW (ref 36.0–46.0)
Hemoglobin: 11.6 g/dL — ABNORMAL LOW (ref 12.0–15.0)
O2 Saturation: 100 %
Patient temperature: 98.4
Potassium: 3.3 mmol/L — ABNORMAL LOW (ref 3.5–5.1)
Sodium: 138 mmol/L (ref 135–145)
TCO2: 34 mmol/L — ABNORMAL HIGH (ref 22–32)
pCO2 arterial: 36.9 mmHg (ref 32.0–48.0)
pH, Arterial: 7.563 — ABNORMAL HIGH (ref 7.350–7.450)
pO2, Arterial: 149 mmHg — ABNORMAL HIGH (ref 83.0–108.0)

## 2020-09-20 LAB — RESP PANEL BY RT-PCR (FLU A&B, COVID) ARPGX2
Influenza A by PCR: NEGATIVE
Influenza B by PCR: NEGATIVE
SARS Coronavirus 2 by RT PCR: NEGATIVE

## 2020-09-20 LAB — HIV ANTIBODY (ROUTINE TESTING W REFLEX): HIV Screen 4th Generation wRfx: NONREACTIVE

## 2020-09-20 LAB — D-DIMER, QUANTITATIVE: D-Dimer, Quant: 0.3 ug/mL-FEU (ref 0.00–0.50)

## 2020-09-20 MED ORDER — BIOTENE DRY MOUTH MT LIQD: 15.0000 mL | OROMUCOSAL | Status: DC | PRN

## 2020-09-20 MED ORDER — LORAZEPAM 2 MG/ML IJ SOLN
1.0000 mg | Freq: Once | INTRAMUSCULAR | Status: AC
  Administered 2020-09-20: 11:00:00 1 mg via INTRAVENOUS
  Filled 2020-09-20: qty 1

## 2020-09-20 MED ORDER — LORAZEPAM 2 MG/ML IJ SOLN
1.0000 mg | INTRAMUSCULAR | Status: DC | PRN
  Administered 2020-09-22 (×2): 1 mg via INTRAVENOUS
  Administered 2020-09-23: 2 mg via INTRAVENOUS
  Administered 2020-09-24 (×3): 1 mg via INTRAVENOUS
  Administered 2020-09-25: 2 mg via INTRAVENOUS
  Administered 2020-09-25 – 2020-09-27 (×2): 1 mg via INTRAVENOUS
  Filled 2020-09-20 (×9): qty 1

## 2020-09-20 MED ORDER — SODIUM CHLORIDE 0.9 % IV BOLUS
1000.0000 mL | Freq: Once | INTRAVENOUS | Status: AC
  Administered 2020-09-20: 07:00:00 1000 mL via INTRAVENOUS

## 2020-09-20 MED ORDER — GLYCOPYRROLATE 1 MG PO TABS
1.0000 mg | ORAL_TABLET | ORAL | Status: DC | PRN
  Filled 2020-09-20: qty 1

## 2020-09-20 MED ORDER — MORPHINE BOLUS VIA INFUSION
2.0000 mg | INTRAVENOUS | Status: DC | PRN
  Administered 2020-09-23 – 2020-09-29 (×4): 2 mg via INTRAVENOUS
  Administered 2020-09-29: 4 mg via INTRAVENOUS
  Administered 2020-09-29: 2 mg via INTRAVENOUS
  Filled 2020-09-20: qty 4

## 2020-09-20 MED ORDER — ACETAMINOPHEN 650 MG RE SUPP: 650.0000 mg | Freq: Four times a day (QID) | RECTAL | Status: DC | PRN

## 2020-09-20 MED ORDER — ALBUTEROL SULFATE (2.5 MG/3ML) 0.083% IN NEBU
2.5000 mg | INHALATION_SOLUTION | RESPIRATORY_TRACT | Status: DC | PRN
  Administered 2020-09-22: 2.5 mg via RESPIRATORY_TRACT

## 2020-09-20 MED ORDER — ASPIRIN EC 81 MG PO TBEC
81.0000 mg | DELAYED_RELEASE_TABLET | Freq: Every day | ORAL | Status: DC
  Administered 2020-09-20: 18:00:00 81 mg via ORAL
  Filled 2020-09-20: qty 1

## 2020-09-20 MED ORDER — PANTOPRAZOLE SODIUM 40 MG PO TBEC
40.0000 mg | DELAYED_RELEASE_TABLET | Freq: Every day | ORAL | Status: DC
  Administered 2020-09-20: 18:00:00 40 mg via ORAL
  Filled 2020-09-20: qty 1

## 2020-09-20 MED ORDER — ALBUTEROL (5 MG/ML) CONTINUOUS INHALATION SOLN
10.0000 mg/h | INHALATION_SOLUTION | RESPIRATORY_TRACT | Status: DC
  Administered 2020-09-20: 06:00:00 10 mg/h via RESPIRATORY_TRACT
  Filled 2020-09-20: qty 20

## 2020-09-20 MED ORDER — ROFLUMILAST 500 MCG PO TABS
500.0000 ug | ORAL_TABLET | Freq: Every day | ORAL | Status: DC
  Administered 2020-09-21 – 2020-09-24 (×4): 500 ug via ORAL
  Filled 2020-09-20 (×10): qty 1

## 2020-09-20 MED ORDER — ONDANSETRON 4 MG PO TBDP
4.0000 mg | ORAL_TABLET | Freq: Four times a day (QID) | ORAL | Status: DC | PRN
  Filled 2020-09-20: qty 1

## 2020-09-20 MED ORDER — MORPHINE 100MG IN NS 100ML (1MG/ML) PREMIX INFUSION
2.0000 mg/h | INTRAVENOUS | Status: DC
  Administered 2020-09-20 – 2020-09-22 (×2): 2 mg/h via INTRAVENOUS
  Administered 2020-09-24: 3 mg/h via INTRAVENOUS
  Administered 2020-09-25: 4 mg/h via INTRAVENOUS
  Administered 2020-09-26 – 2020-09-28 (×4): 6 mg/h via INTRAVENOUS
  Administered 2020-09-29: 8 mg/h via INTRAVENOUS
  Administered 2020-09-29: 7 mg/h via INTRAVENOUS
  Administered 2020-09-29: 6 mg/h via INTRAVENOUS
  Filled 2020-09-20 (×10): qty 100

## 2020-09-20 MED ORDER — LORAZEPAM 2 MG/ML IJ SOLN: 1.0000 mg | INTRAMUSCULAR | Status: DC | PRN

## 2020-09-20 MED ORDER — GLYCOPYRROLATE 0.2 MG/ML IJ SOLN
0.2000 mg | INTRAMUSCULAR | Status: DC | PRN
  Filled 2020-09-20: qty 1

## 2020-09-20 MED ORDER — POLYVINYL ALCOHOL 1.4 % OP SOLN
1.0000 [drp] | Freq: Four times a day (QID) | OPHTHALMIC | Status: DC | PRN
  Filled 2020-09-20: qty 15

## 2020-09-20 MED ORDER — METHYLPREDNISOLONE SODIUM SUCC 125 MG IJ SOLR
125.0000 mg | Freq: Once | INTRAMUSCULAR | Status: AC
  Administered 2020-09-20: 04:00:00 125 mg via INTRAVENOUS
  Filled 2020-09-20: qty 2

## 2020-09-20 MED ORDER — HYDROXYZINE HCL 25 MG PO TABS: 25.0000 mg | ORAL_TABLET | Freq: Four times a day (QID) | ORAL | Status: DC | PRN

## 2020-09-20 MED ORDER — SODIUM CHLORIDE 0.9 % IV SOLN
2.0000 g | Freq: Two times a day (BID) | INTRAVENOUS | Status: DC
  Administered 2020-09-20 – 2020-09-21 (×3): 2 g via INTRAVENOUS
  Filled 2020-09-20 (×3): qty 2

## 2020-09-20 MED ORDER — MORPHINE SULFATE (PF) 4 MG/ML IV SOLN
4.0000 mg | INTRAVENOUS | Status: DC
  Filled 2020-09-20: qty 1

## 2020-09-20 MED ORDER — UMECLIDINIUM BROMIDE 62.5 MCG/INH IN AEPB
1.0000 | INHALATION_SPRAY | Freq: Every day | RESPIRATORY_TRACT | Status: DC
  Administered 2020-09-21 – 2020-09-25 (×5): 1 via RESPIRATORY_TRACT
  Filled 2020-09-20: qty 7

## 2020-09-20 MED ORDER — ONDANSETRON HCL 4 MG/2ML IJ SOLN
4.0000 mg | Freq: Four times a day (QID) | INTRAMUSCULAR | Status: DC | PRN
  Administered 2020-09-21 (×2): 4 mg via INTRAVENOUS
  Filled 2020-09-20 (×2): qty 2

## 2020-09-20 MED ORDER — SODIUM CHLORIDE 0.9 % IV SOLN: 250.0000 mL | INTRAVENOUS | Status: DC | PRN

## 2020-09-20 MED ORDER — ALBUTEROL SULFATE (2.5 MG/3ML) 0.083% IN NEBU
INHALATION_SOLUTION | RESPIRATORY_TRACT | Status: AC
  Administered 2020-09-20 (×2): 2.5 mg via RESPIRATORY_TRACT
  Filled 2020-09-20: qty 3

## 2020-09-20 MED ORDER — AEROCHAMBER PLUS FLO-VU LARGE MISC
1.0000 | Freq: Once | Status: AC
  Administered 2020-09-20: 05:00:00 1

## 2020-09-20 MED ORDER — ALBUTEROL SULFATE (2.5 MG/3ML) 0.083% IN NEBU
2.5000 mg | INHALATION_SOLUTION | RESPIRATORY_TRACT | Status: DC
  Administered 2020-09-20: 13:00:00 2.5 mg via RESPIRATORY_TRACT

## 2020-09-20 MED ORDER — POTASSIUM CHLORIDE CRYS ER 20 MEQ PO TBCR
40.0000 meq | EXTENDED_RELEASE_TABLET | Freq: Once | ORAL | Status: AC
  Administered 2020-09-20: 16:00:00 40 meq via ORAL
  Filled 2020-09-20: qty 2

## 2020-09-20 MED ORDER — FLUTICASONE FUROATE-VILANTEROL 100-25 MCG/INH IN AEPB: 1.0000 | INHALATION_SPRAY | Freq: Every day | RESPIRATORY_TRACT | Status: DC

## 2020-09-20 MED ORDER — MORPHINE SULFATE (PF) 2 MG/ML IV SOLN
2.0000 mg | INTRAVENOUS | Status: AC
  Administered 2020-09-20: 20:00:00 2 mg via INTRAVENOUS
  Filled 2020-09-20: qty 1

## 2020-09-20 MED ORDER — ALBUTEROL (5 MG/ML) CONTINUOUS INHALATION SOLN
10.0000 mg/h | INHALATION_SOLUTION | Freq: Once | RESPIRATORY_TRACT | Status: AC
  Administered 2020-09-20: 09:00:00 10 mg/h via RESPIRATORY_TRACT

## 2020-09-20 MED ORDER — ACETAMINOPHEN 325 MG PO TABS
650.0000 mg | ORAL_TABLET | Freq: Four times a day (QID) | ORAL | Status: DC | PRN
  Filled 2020-09-20: qty 2

## 2020-09-20 MED ORDER — IPRATROPIUM BROMIDE HFA 17 MCG/ACT IN AERS
2.0000 | INHALATION_SPRAY | Freq: Once | RESPIRATORY_TRACT | Status: AC
  Administered 2020-09-20: 04:00:00 2 via RESPIRATORY_TRACT
  Filled 2020-09-20: qty 12.9

## 2020-09-20 MED ORDER — FLUTICASONE FUROATE-VILANTEROL 200-25 MCG/INH IN AEPB
1.0000 | INHALATION_SPRAY | Freq: Every day | RESPIRATORY_TRACT | Status: DC
  Administered 2020-09-21 – 2020-09-25 (×6): 1 via RESPIRATORY_TRACT
  Filled 2020-09-20: qty 28

## 2020-09-20 MED ORDER — PREDNISONE 20 MG PO TABS
40.0000 mg | ORAL_TABLET | Freq: Every day | ORAL | Status: DC
  Administered 2020-09-21: 08:00:00 40 mg via ORAL
  Filled 2020-09-20: qty 2

## 2020-09-20 MED ORDER — GLYCOPYRROLATE 0.2 MG/ML IJ SOLN
0.2000 mg | INTRAMUSCULAR | Status: DC | PRN
  Administered 2020-09-27 – 2020-09-29 (×5): 0.2 mg via INTRAVENOUS
  Filled 2020-09-20 (×4): qty 1

## 2020-09-20 MED ORDER — FERROUS SULFATE 325 (65 FE) MG PO TABS: 325.0000 mg | ORAL_TABLET | ORAL | Status: DC

## 2020-09-20 MED ORDER — CYCLOSPORINE 0.05 % OP EMUL
1.0000 [drp] | Freq: Two times a day (BID) | OPHTHALMIC | Status: DC
  Administered 2020-09-21 – 2020-09-29 (×13): 1 [drp] via OPHTHALMIC
  Filled 2020-09-20 (×22): qty 1

## 2020-09-20 MED ORDER — SODIUM CHLORIDE 0.9% FLUSH
3.0000 mL | Freq: Two times a day (BID) | INTRAVENOUS | Status: DC
  Administered 2020-09-22 – 2020-09-29 (×6): 3 mL via INTRAVENOUS

## 2020-09-20 MED ORDER — SODIUM CHLORIDE 0.9% FLUSH: 3.0000 mL | INTRAVENOUS | Status: DC | PRN

## 2020-09-20 MED ORDER — IPRATROPIUM BROMIDE 0.02 % IN SOLN
0.5000 mg | Freq: Once | RESPIRATORY_TRACT | Status: AC
  Administered 2020-09-20: 09:00:00 0.5 mg via RESPIRATORY_TRACT
  Filled 2020-09-20: qty 2.5

## 2020-09-20 MED ORDER — ALBUTEROL SULFATE (2.5 MG/3ML) 0.083% IN NEBU
2.5000 mg | INHALATION_SOLUTION | RESPIRATORY_TRACT | Status: DC
  Administered 2020-09-20 – 2020-09-21 (×7): 2.5 mg via RESPIRATORY_TRACT
  Filled 2020-09-20 (×8): qty 3

## 2020-09-20 MED ORDER — ENOXAPARIN SODIUM 40 MG/0.4ML ~~LOC~~ SOLN
40.0000 mg | SUBCUTANEOUS | Status: DC
  Administered 2020-09-20: 40 mg via SUBCUTANEOUS
  Filled 2020-09-20: qty 0.4

## 2020-09-20 MED ORDER — ALBUTEROL SULFATE HFA 108 (90 BASE) MCG/ACT IN AERS
8.0000 | INHALATION_SPRAY | Freq: Once | RESPIRATORY_TRACT | Status: AC
  Administered 2020-09-20: 04:00:00 8 via RESPIRATORY_TRACT
  Filled 2020-09-20: qty 6.7

## 2020-09-20 NOTE — Consult Note (Signed)
Consultation Note Date: 09/20/2020   Patient Name: Mary Jenkins  DOB: 02-01-1956  MRN: [id number]  Age / Sex: 64 y.o., female  PCP: Matilde Haymaker, MD Referring Physician: Martyn Malay, MD  Reason for Consultation: Establishing goals of care  HPI/Patient Profile: 64 y.o. female  with past medical history of advanced COPD (GOLD IV), breast cancer (1997, s/p right mastectomy), anemia, and vertebral compression fracture secondary to osetopenia presenting to the emergency department on 12/28/2021with shortness of breath and chest tightness. She was recently hospitalized 12/18--12/24 with COPD exacerbation.  ED Course: Dyspneic on exam, normal chest x-ray, no hypercarbia noted on ABG, negative D-dimer. Admitted to IM teaching service for acute on chronic hypoxemic respiratory failure likely due to worsening/exacerbation of COPD. Palliative Medicine was consulted per patient request.   Clinical Assessment and Goals of Care: I have reviewed medical records including EPIC notes, labs and imaging, and met at bedside with patient in the ED to discuss diagnosis, prognosis, GOC, EOL wishes, disposition, and options.  Patient is known to me from her admission in November. At that time, she clearly understood that her condition was ultimately terminal at some point but was not ready to pursue hospice care. I express that it seems she is "in a different place" now, and she agrees.   Patient shares with me how difficult it has been for her living with this advanced state of COPD. She is tearful at times, making statements including "I can't go on living like this" and "I just want to go to sleep and not wake up".   I asked patient if she had shared these feelings with her daughter, who in the past has been resistant to a comfort approach. Patient states that yes, her daughter seems to be supportive of her wishes to be  comfortable.   Patient is agreeable to hospice care at home. However, she expresses the need to have someone at home with her 24/7. Her daughter lives with her but works Monday-Friday. Discussed that home hospice provided only intermittent care.   During my visit, patient needs to void and I assist her to the [facility]. This proves to be an extremely strenuous task for her - she is very dyspneic and unable to speak for sometime after getting back to bed.   18:15 - I speak with daughter [name] by phone. Whe I mention that patient has expressed wanting to go home with hospice, [name] does not think this is a viable option. I offer to meet with her at bedside.   18:50 - I meet at bedside on 3E with patient and daughter. We discussed the issue of code status. Emphasizing that in the event of cardiopulmonary arrest, aggressive resuscitation efforts were unlikely to be successful and could cause additional pain. I expressed a recommendation for having a DNR order in place, to which patient and daughter agrees.   Patient and daughter both express the desire for her not to suffer any longer.   Discussed transitioning to comfort care while in the hospital,  and what that would look like--keeping her clean and dry, no labs, no artificial hydration or feeding, minimizing of medications, comfort feeds, medication for pain and dyspnea as needed.   Patient and daughter both agree that comfort will be be the main goal of care at this point.    Primary decision maker: Patient with support from daughter [name]. Mare Ferrari [phone]    SUMMARY OF RECOMMENDATIONS    Full comfort measures initiated  Code status changed to DNR/DNI   Added orders for symptom management at EOL as well as discontinued orders that were not focused on comfort  Unrestricted visitation orders were placed per current [facility]  EOL visitation policy   Provide frequent assessments and administer PRN medications as clinically  necessary to ensure EOL comfort  May place foley for comfort  PMT will continue to follow holistically  Code Status/Advance Care Planning:  DNR/DNI  Symptom Management:   Morphine infusion for pain and/or dyspnea  Lorazepam (ATIVAN) prn for anxiety  Glycopyrrolate (ROBINUL) for excessive secretions  Ondansetron (ZOFRAN) prn for nausea  Polyvinyl alcohol (LIQUIFILM TEARS) prn for dry eyes  Antiseptic oral rinse (BIOTENE) prn for dry mouth   Palliative Prophylaxis:   Turn/reposition for comfort, frequent pain assessment  Additional Recommendations (Limitations, Scope, Preferences):  Full comfort care  Psycho-social/Spiritual:   Created space and opportunity for patient and family to express thoughts and feelings regarding patient's current medical situation.   Emotional support provided   Prognosis:   Difficult to determine, possibly days  Discharge Planning: to be determined      Primary Diagnoses: Present on Admission: . COPD exacerbation ([place]) . Chronic respiratory failure with hypoxia and hypercapnia (HCC)   I have reviewed the medical record, interviewed the patient and family, and examined the patient. The following aspects are pertinent.  Past Medical History:  Diagnosis Date  . Breast cancer ([place])   . Cataract, immature    bilateral  . COPD (chronic obstructive pulmonary disease) ([place])    no home O2  . Cough 02/09/2015  . DDD (degenerative disc disease), cervical   . Exertional shortness of breath    states if she "takes her time" doing activities does not get SOB  . Family history of adverse reaction to anesthesia    pt's sister has hx. of post-op N/V  . Full dentures   . GERD (gastroesophageal reflux disease) 02/03/2012  . High cholesterol    no current med.  Marland Kitchen History of breast cancer   . Personal history of chemotherapy 1997  . Runny nose 02/09/2015   clear drainage, per pt.   Social History   Socioeconomic History  . Marital  status: Single    Spouse name: Not on file  . Number of children: Not on file  . Years of education: Not on file  . Highest education level: Not on file  Occupational History  . Occupation: Disabled  Tobacco Use  . Smoking status: Former Smoker    Quit date: 07/23/2012    Years since quitting: 8.1  . Smokeless tobacco: Never Used  Vaping Use  . Vaping Use: Never used  Substance and Sexual Activity  . Alcohol use: No    Alcohol/week: 0.0 standard drinks    Comment: one beer every now and then  . Drug use: No  . Sexual activity: Not Currently    Partners: Male    Birth control/protection: Post-menopausal  Other Topics Concern  . Not on file  Social History Narrative   Current Social  History 05/28/2019        Patient lives alone in a ground floor apartment which is 1 story. There are not steps up to the entrance the patient uses.       Patient's method of transportation is personal car.      The highest level of education was 11 th grade; currently working on getting GED!      The patient currently disabled.      Identified important Relationships are "My kids, my sister, and brothers."       Pets : None       Interests / Fun: "Go to my son's house and keep my 32 yo grandson."       Current Stressors: "People stress me out."       Religious / Personal Beliefs: "I serve an awesome God and believe in the power of prayer."       L. Ducatte, RN, BSN       Social Determinants of Health   Financial Resource Strain: Not on file  Food Insecurity: Not on file  Transportation Needs: Not on file  Physical Activity: Not on file  Stress: Not on file  Social Connections: Not on file   Family History  Problem Relation Age of Onset  . Stroke Mother   . Heart disease Brother   . Heart disease Brother   . Anesthesia problems Sister        post-op N/V  . Cancer Sister        Endometrial CA  . Heart disease Brother   . Tuberculosis Daughter        not active  . Allergic  rhinitis Daughter   . Allergic rhinitis Son   . Hypertension Sister   . Diabetes Sister   . Breast cancer Neg Hx    Scheduled Meds: . albuterol  2.5 mg Nebulization Q4H  . aspirin EC  81 mg Oral Daily  . cycloSPORINE  1 drop Both Eyes BID  . enoxaparin (LOVENOX) injection  40 mg Subcutaneous Q24H  . [START ON 09/21/2020] ferrous sulfate  325 mg Oral QODAY  . fluticasone furoate-vilanterol  1 puff Inhalation Daily  . pantoprazole  40 mg Oral Daily  . [START ON 09/21/2020] predniSONE  40 mg Oral Q breakfast  . roflumilast  500 mcg Oral Daily  . sodium chloride flush  3 mL Intravenous Q12H  . umeclidinium bromide  1 puff Inhalation Daily   Continuous Infusions: . sodium chloride    . ceFEPime (MAXIPIME) IV Stopped (09/20/20 1617)   PRN Meds:.sodium chloride, albuterol, hydrOXYzine, sodium chloride flush Medications Prior to Admission:  Prior to Admission medications   Medication Sig Start Date End Date Taking? Authorizing Provider  albuterol (PROAIR HFA) 108 (90 Base) MCG/ACT inhaler Inhale 2 puffs into the lungs every 6 (six) hours as needed for wheezing or shortness of breath. 03/06/20  Yes Matilde Haymaker, MD  albuterol (PROVENTIL) (2.5 MG/3ML) 0.083% nebulizer solution Take 3 mLs (2.5 mg total) by nebulization every 6 (six) hours as needed for wheezing or shortness of breath. 05/03/20  Yes Martyn Ehrich, NP  aspirin EC 81 MG tablet Take 81 mg by mouth daily. Swallow whole.   Yes [provider]  Budeson-Glycopyrrol-Formoterol (BREZTRI AEROSPHERE) 160-9-4.8 MCG/ACT AERO Inhale 2 puffs into the lungs 2 (two) times daily. 12/28/19  Yes Mullis, Kiersten P, DO  calcitonin, salmon, (MIACALCIN/FORTICAL) 200 UNIT/ACT nasal spray Place 1 spray into alternate nostrils daily. 08/16/20  Yes Ezequiel Essex, MD  cycloSPORINE (  RESTASIS) 0.05 % ophthalmic emulsion Place 1 drop into both eyes 2 (two) times daily.    Yes [provider]  ferrous sulfate 324 (65 Fe) MG TBEC Take 1  tablet (325 mg total) by mouth daily. OK to take every other day if it is constipating. Patient taking differently: Take 1 tablet by mouth every other day. 05/15/20  Yes Matilde Haymaker, MD  hydrOXYzine (ATARAX/VISTARIL) 25 MG tablet Take 1 tablet (25 mg total) by mouth every 6 (six) hours as needed for anxiety. 09/11/20  Yes Matilde Haymaker, MD  naloxone [facility]) 4 MG/0.1ML LIQD nasal spray kit Place 1 spray into the nose once as needed (opioid overdose).   Yes [provider]  oxyCODONE (OXY IR/ROXICODONE) 5 MG immediate release tablet Take 5 mg by mouth every 4 (four) hours as needed for severe pain.   Yes [provider]  OXYGEN Place 4 L into the nose continuous.   Yes [provider]  pantoprazole (PROTONIX) 40 MG tablet Take 1 tablet (40 mg total) by mouth daily. 05/03/20  Yes Martyn Ehrich, NP  predniSONE (DELTASONE) 1 MG tablet 09/21/20 take 5 tablets (17m); 09/22/20 and 09/23/20 take two and a half tablets (2.557m; 09/24/20 and 09/25/20 take one tablet (79m34m Then stop 09/15/20  Yes Simmons-Robinson, MakRiki SheerD  predniSONE (DELTASONE) 20 MG tablet 12/25 take 2 and a half tablets (22m58m12/26 take 2 tablets (40mg62m2/27 take 1 and a half tablets (30mg)30m/28 take 1 tablet (20mg);179m29 take half a tablet (10mg) 153m/21  Yes Simmons-Robinson, Makiera, MD  roflumilast (DALIRESP) 500 MCG TABS tablet Take 1 tablet (500 mcg total) by mouth daily. 05/17/20  Yes Walsh, EMartyn EhrichlACYclovir (VALTREX) 1000 MG tablet TAKE 1 TABLET BY MOUTH TWICE DAILY FOR 3 DAYS AS NEEDED Patient taking differently: Take 1,000 mg by mouth See admin instructions. Take one tablet (1000 mg) by mouth twice daily for 3 days - as needed for outbreaks 05/24/19  Yes Harper, Shelly Bombardo Known Allergies Review of Systems  Constitutional: Positive for fatigue.  Respiratory: Positive for shortness of breath.   Neurological: Positive for weakness.    Physical Exam Vitals reviewed.   Constitutional:      General: She is in acute distress.     Appearance: She is ill-appearing.  Cardiovascular:     Rate and Rhythm: Tachycardia present.  Pulmonary:     Effort: Tachypnea and accessory muscle usage present.  Neurological:     Mental Status: She is alert and oriented to person, place, and time.     Motor: Weakness and tremor present.  Psychiatric:        Mood and Affect: Mood is anxious. Affect is tearful.     Vital Signs: BP 91/76   Pulse (!) 106   Temp 98.2 F (36.8 C) (Oral)   Resp 17   Ht _0  (1.676 m)   Wt 46.3 kg   SpO2 100%   BMI 16.46 kg/m          SpO2: SpO2: 100 % O2 Device:SpO2: 100 % O2 Flow Rate: .O2 Flow Rate (L/min): 4 L/min  IO: Intake/output summary: No intake or output data in the 24 hours ending 09/20/20 1758  LBM:   Baseline Weight: Weight: 46.3 kg Most recent weight: Weight: 46.3 kg      Palliative Assessment/Data: PPS 20%     Time In: 17:58 Time Out: 19:45 Time Total: 107 minutes Prolonged time billed: yes  Greater than  50%  of this time was spent counseling and coordinating care related to the above assessment and plan.  Signed by: Lavena Bullion, NP   Please contact Palliative Medicine Team phone at [phone] for questions and concerns.  For individual provider: See Shea Evans

## 2020-09-20 NOTE — ED Notes (Signed)
Dinner Tray Ordered @ 1714.

## 2020-09-20 NOTE — ED Notes (Signed)
Palliative at the bedside.

## 2020-09-20 NOTE — ED Provider Notes (Signed)
Patient signed out to reassess breathing, copd exacerbation.  Patient received continuous albuterol neb. atrovent txs as well. Solumedrol iv.   Reviewed nursing notes and prior charts for additional history.   Remains on continuous pulse ox and cardiac monitoring. Sinus tach.   After continuous neb, slightly improved air movement, remains dyspneic with minimal activity/moving on stretcher or talking. Labs reviewed/interpreted by me - ddimer is normal. CXR reviewed/interpreted by me - no pna.   Patient denies chest pain. Afebrile.   Persistent wheezing and poor air movement.   Additional continuous alb neb over 1 hr. Additional atrovent tx. Additional reassessments.   Although air movement mildly improved from prior, pt remains dyspneic, wheezing, and feels unable to return home. Family practice service consulted for admission.  Results for orders placed or performed during the hospital encounter of 09/06/2020  Resp Panel by RT-PCR (Flu A&B, Covid) Nasopharyngeal Swab   Specimen: Nasopharyngeal Swab; Nasopharyngeal(NP) swabs in vial transport medium  Result Value Ref Range   SARS Coronavirus 2 by RT PCR NEGATIVE NEGATIVE   Influenza A by PCR NEGATIVE NEGATIVE   Influenza B by PCR NEGATIVE NEGATIVE  Basic metabolic panel  Result Value Ref Range   Sodium 139 135 - 145 mmol/L   Potassium 3.8 3.5 - 5.1 mmol/L   Chloride 99 98 - 111 mmol/L   CO2 29 22 - 32 mmol/L   Glucose, Bld 150 (H) 70 - 99 mg/dL   BUN <5 (L) 8 - 23 mg/dL   Creatinine, Ser 0.72 0.44 - 1.00 mg/dL   Calcium 9.0 8.9 - 10.3 mg/dL   GFR, Estimated >60 >60 mL/min   Anion gap 11 5 - 15  CBC  Result Value Ref Range   WBC 12.1 (H) 4.0 - 10.5 K/uL   RBC 4.21 3.87 - 5.11 MIL/uL   Hemoglobin 11.1 (L) 12.0 - 15.0 g/dL   HCT 35.0 (L) 36.0 - 46.0 %   MCV 83.1 80.0 - 100.0 fL   MCH 26.4 26.0 - 34.0 pg   MCHC 31.7 30.0 - 36.0 g/dL   RDW 17.2 (H) 11.5 - 15.5 %   Platelets 332 150 - 400 K/uL   nRBC 0.0 0.0 - 0.2 %  D-dimer,  quantitative (not at [facility])  Result Value Ref Range   D-Dimer, Quant 0.30 0.00 - 0.50 ug/mL-FEU  I-Stat arterial blood gas, [facility] ED)  Result Value Ref Range   pH, Arterial 7.563 (H) 7.350 - 7.450   pCO2 arterial 36.9 32.0 - 48.0 mmHg   pO2, Arterial 149 (H) 83.0 - 108.0 mmHg   Bicarbonate 33.3 (H) 20.0 - 28.0 mmol/L   TCO2 34 (H) 22 - 32 mmol/L   O2 Saturation 100.0 %   Acid-Base Excess 10.0 (H) 0.0 - 2.0 mmol/L   Sodium 138 135 - 145 mmol/L   Potassium 3.3 (L) 3.5 - 5.1 mmol/L   Calcium, Ion 1.16 1.15 - 1.40 mmol/L   HCT 34.0 (L) 36.0 - 46.0 %   Hemoglobin 11.6 (L) 12.0 - 15.0 g/dL   Patient temperature 98.4 F    Collection site Radial    Drawn by RT    Sample type ARTERIAL    DG Chest 2 View  Result Date: 09/20/2020 CLINICAL DATA:  Shortness of breath EXAM: CHEST - 2 VIEW COMPARISON:  09/09/2020 FINDINGS: The lungs are hyperinflated with diffuse interstitial prominence. No focal airspace consolidation or pulmonary edema. No pleural effusion or pneumothorax. Normal cardiomediastinal contours. IMPRESSION: COPD without acute airspace disease. Electronically Signed  By: Ulyses Jarred M.D.   On: 09/20/2020 02:25   DG Chest Port 1 View  Result Date: 09/09/2020 CLINICAL DATA:  Respiratory failure.  COPD. EXAM: PORTABLE CHEST 1 VIEW COMPARISON:  08/12/2020 chest radiograph. FINDINGS: Surgical clips overlie the right axilla. Right breast prosthesis. Stable cardiomediastinal silhouette with normal heart size. No pneumothorax. No pleural effusion. Hyperinflated lungs and severe emphysema. No pulmonary edema. No acute consolidative airspace disease. IMPRESSION: Hyperinflated lungs and severe emphysema, compatible with reported COPD. No acute cardiopulmonary disease. Electronically Signed   By: Ilona Sorrel M.D.   On: 09/09/2020 18:58   CRITICAL CARE RE: acute on chr resp failure w hypoxia/dyspnea, copd exacerbation requiring multiple continuous neb treatments, iv steroids.  Performed by: Mirna Mires Total critical care time: 35 minutes Critical care time was exclusive of separately billable procedures and treating other patients. Critical care was necessary to treat or prevent imminent or life-threatening deterioration. Critical care was time spent personally by me on the following activities: development of treatment plan with patient and/or surrogate as well as nursing, discussions with consultants, evaluation of patient's response to treatment, examination of patient, obtaining history from patient or surrogate, ordering and performing treatments and interventions, ordering and review of laboratory studies, ordering and review of radiographic studies, pulse oximetry and re-evaluation of patient's condition.    Lajean Saver, MD 09/20/20 1018

## 2020-09-20 NOTE — Discharge Instructions (Signed)
Please use your spacer if you notice better efficacy of your inhaler puffs. Please follow up with your pulmonologist.

## 2020-09-20 NOTE — ED Provider Notes (Addendum)
[facility] EMERGENCY DEPARTMENT Provider Note  CSN: [id number] Arrival date & time: 08/27/2020 1346  Chief Complaint(s) Shortness of Breath  HPI Mary Jenkins is a 64 y.o. female with a past medical history listed below including COPD on 4 L nasal cannula at home who presents to the emergency department with 1 day of gradually worsening shortness of breath.  Patient was recently admitted over the holiday for COPD exacerbation requiring antibiotics.  Patient sent home on steroids.  Reports that she was doing well up until today. Is endorsing continued productive cough. No chest pain. Shortness of breath worse with coughing and exertion. Patient reports using her home nebulizers and inhalers without relief. Reports that she has a sensation of burping but cannot get it up. Denies any other physical complaints.  HPI  Past Medical History Past Medical History:  Diagnosis Date  . Breast cancer ([place])   . Cataract, immature    bilateral  . COPD (chronic obstructive pulmonary disease) ([place])    no home O2  . Cough 02/09/2015  . DDD (degenerative disc disease), cervical   . Exertional shortness of breath    states if she "takes her time" doing activities does not get SOB  . Family history of adverse reaction to anesthesia    pt's sister has hx. of post-op N/V  . Full dentures   . GERD (gastroesophageal reflux disease) 02/03/2012  . High cholesterol    no current med.  Marland Kitchen History of breast cancer   . Personal history of chemotherapy 1997  . Runny nose 02/09/2015   clear drainage, per pt.   Patient Active Problem List   Diagnosis Date Noted  . Protein-calorie malnutrition, severe 09/11/2020  . Acute respiratory failure with hypoxia ([place])   . SOB (shortness of breath) 09/09/2020  . Compression fracture of body of thoracic vertebra ([place]) 08/12/2020  . Back pain 08/12/2020  . Pain   . Chronic obstructive pulmonary disease ([place])   . Palliative care by specialist    . Goals of care, counseling/discussion   . Dysphagia 04/23/2020  . Acute respiratory distress 04/22/2020  . Chills (without fever) 02/23/2020  . Shortness of breath 12/27/2019  . Screening for hyperlipidemia 12/03/2019  . Bruising 07/21/2019  . COPD exacerbation ([place]) 01/11/2019  . Healthcare maintenance 05/23/2018  . Chronic respiratory failure with hypoxia and hypercapnia ([place]) 04/13/2018  . Weight loss 08/02/2017  . Severe episode of recurrent major depressive disorder, without psychotic features ([place]) 04/25/2017  . Encounter for HCV screening test for low risk patient 03/10/2017  . History of hyperkalemia 02/13/2016  . Skin lesion of back 11/15/2014  . Atypical chest pain 10/21/2014  . Cataract 10/21/2014  . Dyslipidemia, goal to be determined 04/22/2014  . Lipoma 10/07/2012  . Cervical spine arthritis 08/27/2012  . History of breast cancer 07/23/2012  . GERD (gastroesophageal reflux disease) 02/03/2012  . Anemia 07/29/2011  . COPD GOLD IV 06/24/2011  . Liver cyst 06/24/2011  . Insomnia 06/24/2011   Home Medication(s) Prior to Admission medications   Medication Sig Start Date End Date Taking? Authorizing Provider  albuterol (PROAIR HFA) 108 (90 Base) MCG/ACT inhaler Inhale 2 puffs into the lungs every 6 (six) hours as needed for wheezing or shortness of breath. 03/06/20  Yes Matilde Haymaker, MD  albuterol (PROVENTIL) (2.5 MG/3ML) 0.083% nebulizer solution Take 3 mLs (2.5 mg total) by nebulization every 6 (six) hours as needed for wheezing or shortness of breath. 05/03/20  Yes Martyn Ehrich, NP  aspirin  EC 81 MG tablet Take 81 mg by mouth daily. Swallow whole.   Yes [provider]  Budeson-Glycopyrrol-Formoterol (BREZTRI AEROSPHERE) 160-9-4.8 MCG/ACT AERO Inhale 2 puffs into the lungs 2 (two) times daily. 12/28/19  Yes Mullis, Kiersten P, DO  calcitonin, salmon, (MIACALCIN/FORTICAL) 200 UNIT/ACT nasal spray Place 1 spray into alternate nostrils daily. 08/16/20  Yes  Ezequiel Essex, MD  cycloSPORINE (RESTASIS) 0.05 % ophthalmic emulsion Place 1 drop into both eyes 2 (two) times daily.    Yes [provider]  ferrous sulfate 324 (65 Fe) MG TBEC Take 1 tablet (325 mg total) by mouth daily. OK to take every other day if it is constipating. Patient taking differently: Take 1 tablet by mouth every other day. 05/15/20  Yes Matilde Haymaker, MD  hydrOXYzine (ATARAX/VISTARIL) 25 MG tablet Take 1 tablet (25 mg total) by mouth every 6 (six) hours as needed for anxiety. 09/11/20  Yes Matilde Haymaker, MD  naloxone [facility]) 4 MG/0.1ML LIQD nasal spray kit Place 1 spray into the nose once as needed (opioid overdose).   Yes [provider]  oxyCODONE (OXY IR/ROXICODONE) 5 MG immediate release tablet Take 5 mg by mouth every 4 (four) hours as needed for severe pain.   Yes [provider]  OXYGEN Place 4 L into the nose continuous.   Yes [provider]  pantoprazole (PROTONIX) 40 MG tablet Take 1 tablet (40 mg total) by mouth daily. 05/03/20  Yes Martyn Ehrich, NP  predniSONE (DELTASONE) 1 MG tablet 09/21/20 take 5 tablets (62m); 09/22/20 and 09/23/20 take two and a half tablets (2.529m; 09/24/20 and 09/25/20 take one tablet (40m67m Then stop 09/15/20  Yes Simmons-Robinson, MakRiki SheerD  predniSONE (DELTASONE) 20 MG tablet 12/25 take 2 and a half tablets (59m36m12/26 take 2 tablets (40mg80m2/27 take 1 and a half tablets (30mg)75m/28 take 1 tablet (20mg);62m29 take half a tablet (10mg) 183m/21  Yes Simmons-Robinson, Makiera, MD  roflumilast (DALIRESP) 500 MCG TABS tablet Take 1 tablet (500 mcg total) by mouth daily. 05/17/20  Yes Walsh, EMartyn EhrichlACYclovir (VALTREX) 1000 MG tablet TAKE 1 TABLET BY MOUTH TWICE DAILY FOR 3 DAYS AS NEEDED Patient taking differently: Take 1,000 mg by mouth See admin instructions. Take one tablet (1000 mg) by mouth twice daily for 3 days - as needed for outbreaks 05/24/19  Yes Harper, Shelly Bombard                                                                                                                                 Past Surgical History Past Surgical History:  Procedure Laterality Date  . BREAST CAPSULOTOMY WITH IMPLANT EXCHANGE Right 06/21/2013   Procedure: REVISION RIGHT BREAST RECONSTRUCTION/REMOVAL OF RIGHT IMPLANT/RIGHT BREAST CAPSULOTOMY WITH INSERT TISSUE EXPLANDER RIGHT BREAST/POSSIBLE LEFT BREAST MASTOPEXY;  Surgeon: Gerald TCristine Polioocation: [place]: Plastics;  Laterality: Right;  . BREAST  RECONSTRUCTION Right 06/21/2013   Procedure: BREAST RECONSTRUCTION;  Surgeon: Cristine Polio, MD;  Location: [place];  Service: Plastics;  Laterality: Right;  . BREAST RECONSTRUCTION Left 11/29/2013   Procedure: LEFT MASTOPEXY FOR RECONSTRUCTION;  Surgeon: Cristine Polio, MD;  Location: [place];  Service: Plastics;  Laterality: Left;  . BREAST RECONSTRUCTION Right 03/14/2014   Procedure: RECONSTRUCTION NIPPLE RIGHT BREAST;  Surgeon: Cristine Polio, MD;  Location: [place];  Service: Plastics;  Laterality: Right;  . BREAST SURGERY  97   implant  . CAPSULOTOMY Right 06/30/2017   Procedure: CAPSULOTOMY;  Surgeon: Cristine Polio, MD;  Location: [place];  Service: Plastics;  Laterality: Right;  . COLONOSCOPY    . ESOPHAGOGASTRODUODENOSCOPY (EGD) WITH PROPOFOL N/A 04/25/2020   Procedure: ESOPHAGOGASTRODUODENOSCOPY (EGD) WITH PROPOFOL;  Surgeon: Milus Banister, MD;  Location: [facility] ENDOSCOPY;  Service: Endoscopy;  Laterality: N/A;  . MASTECTOMY, PARTIAL Right 1997   -node dissection  . REDUCTION MAMMAPLASTY Left   . REMOVAL OF TISSUE EXPANDER AND PLACEMENT OF IMPLANT Right 02/13/2015   Procedure: REMOVAL OF TISSUE  EXPANDER  PORT RIGHT BREAST;  Surgeon: Cristine Polio, MD;  Location: [place];  Service: Plastics;  Laterality: Right;  . REMOVAL OF TISSUE EXPANDER AND PLACEMENT OF  IMPLANT Right 06/30/2017   Procedure: REMOVAL OF RIGHT TISSUE EXPANDER WITH PLACEMENT OF RIGHT GELL BREAST IMPLANTS;  Surgeon: Cristine Polio, MD;  Location: [place];  Service: Plastics;  Laterality: Right;  . SCAR REVISION Right 02/13/2015   Procedure: SCAR REVISION RIGHT BREAST;  Surgeon: Cristine Polio, MD;  Location: [place];  Service: Plastics;  Laterality: Right;  . TISSUE EXPANDER PLACEMENT Right 03/14/2014   Procedure: SALINE REMOVAL RIGHT TISSUE EXPANDER;  Surgeon: Cristine Polio, MD;  Location: [place];  Service: Plastics;  Laterality: Right;  . TUBAL LIGATION  1980's   Family History Family History  Problem Relation Age of Onset  . Stroke Mother   . Heart disease Brother   . Heart disease Brother   . Anesthesia problems Sister        post-op N/V  . Cancer Sister        Endometrial CA  . Heart disease Brother   . Tuberculosis Daughter        not active  . Allergic rhinitis Daughter   . Allergic rhinitis Son   . Hypertension Sister   . Diabetes Sister   . Breast cancer Neg Hx     Social History Social History   Tobacco Use  . Smoking status: Former Smoker    Quit date: 07/23/2012    Years since quitting: 8.1  . Smokeless tobacco: Never Used  Vaping Use  . Vaping Use: Never used  Substance Use Topics  . Alcohol use: No    Alcohol/week: 0.0 standard drinks    Comment: one beer every now and then  . Drug use: No   Allergies Patient has no known allergies.  Review of Systems Review of Systems All other systems are reviewed and are negative for acute change except as noted in the HPI  Physical Exam Vital Signs  I have reviewed the triage vital signs BP (!) 129/101 (BP Location: Right Arm)   Pulse 96   Temp 98.4 F (36.9 C) (Oral)   Resp 20   Ht _0  (1.676 m)   Wt 46.3 kg   SpO2 100%   BMI 16.46 kg/m   Physical Exam Vitals  reviewed.  Constitutional:      General: She is not in acute  distress.    Appearance: She is well-developed and well-nourished. She is not diaphoretic.  HENT:     Head: Normocephalic and atraumatic.     Nose: Nose normal.  Eyes:     General: No scleral icterus.       Right eye: No discharge.        Left eye: No discharge.     Extraocular Movements: EOM normal.     Conjunctiva/sclera: Conjunctivae normal.     Pupils: Pupils are equal, round, and reactive to light.  Cardiovascular:     Rate and Rhythm: Regular rhythm. Tachycardia present.     Heart sounds: No murmur heard. No friction rub. No gallop.   Pulmonary:     Effort: Tachypnea, accessory muscle usage and prolonged expiration present.     Breath sounds: Normal breath sounds. Decreased air movement (in all lung fields) present. No stridor. No wheezing, rhonchi or rales.  Abdominal:     General: There is no distension.     Palpations: Abdomen is soft.     Tenderness: There is no abdominal tenderness.  Musculoskeletal:        General: No tenderness or edema.     Cervical back: Normal range of motion and neck supple.  Skin:    General: Skin is warm and dry.     Findings: No erythema or rash.  Neurological:     Mental Status: She is alert and oriented to person, place, and time.  Psychiatric:        Mood and Affect: Mood and affect normal.     ED Results and Treatments Labs (all labs ordered are listed, but only abnormal results are displayed) Labs Reviewed  BASIC METABOLIC PANEL - Abnormal; Notable for the following components:      Result Value   Glucose, Bld 150 (*)    BUN <5 (*)    All other components within normal limits  CBC - Abnormal; Notable for the following components:   WBC 12.1 (*)    Hemoglobin 11.1 (*)    HCT 35.0 (*)    RDW 17.2 (*)    All other components within normal limits  I-STAT ARTERIAL BLOOD GAS, ED - Abnormal; Notable for the following components:   pH, Arterial 7.563 (*)    pO2, Arterial 149 (*)    Bicarbonate 33.3 (*)    TCO2 34 (*)     Acid-Base Excess 10.0 (*)    Potassium 3.3 (*)    HCT 34.0 (*)    Hemoglobin 11.6 (*)    All other components within normal limits  RESP PANEL BY RT-PCR (FLU A&B, COVID) ARPGX2  D-DIMER, QUANTITATIVE (NOT AT [facility])                                                                                                                         EKG  EKG Interpretation  Date/Time:  Tuesday  September 19 2020 13:45:10 EST Ventricular Rate:  113 PR Interval:  130 QRS Duration: 64 QT Interval:  308 QTC Calculation: 422 R Axis:   92 Text Interpretation: Sinus tachycardia Right atrial enlargement Rightward axis Pulmonary disease pattern Abnormal ECG When compared with ECG of 09/09/2020 HEART RATE has decreased Confirmed by Delora Fuel ([id number]) on 09/03/2020 11:45:31 PM      Radiology DG Chest 2 View  Result Date: 09/20/2020 CLINICAL DATA:  Shortness of breath EXAM: CHEST - 2 VIEW COMPARISON:  09/09/2020 FINDINGS: The lungs are hyperinflated with diffuse interstitial prominence. No focal airspace consolidation or pulmonary edema. No pleural effusion or pneumothorax. Normal cardiomediastinal contours. IMPRESSION: COPD without acute airspace disease. Electronically Signed   By: Ulyses Jarred M.D.   On: 09/20/2020 02:25    Pertinent labs & imaging results that were available during my care of the patient were reviewed by me and considered in my medical decision making (see chart for details).  Medications Ordered in ED Medications  albuterol (PROVENTIL,VENTOLIN) solution continuous neb (10 mg/hr Nebulization New Bag/Given 09/20/20 0614)  sodium chloride 0.9 % bolus 1,000 mL (has no administration in time range)  albuterol (VENTOLIN HFA) 108 (90 Base) MCG/ACT inhaler 8 puff (8 puffs Inhalation Given 09/20/20 0428)  ipratropium (ATROVENT HFA) inhaler 2 puff (2 puffs Inhalation Given 09/20/20 0429)  AeroChamber Plus Flo-Vu Large MISC 1 each (1 each Other Given 09/20/20 0524)  methylPREDNISolone sodium  succinate (SOLU-MEDROL) 125 mg/2 mL injection 125 mg (125 mg Intravenous Given 09/20/20 0428)                                                                                                                                    Procedures .1-3 Lead EKG Interpretation Performed by: Fatima Blank, MD Authorized by: Fatima Blank, MD     Interpretation: abnormal     ECG rate:  124   ECG rate assessment: tachycardic     Rhythm: sinus tachycardia     Ectopy: none     Conduction: normal   .Critical Care Performed by: Fatima Blank, MD Authorized by: Fatima Blank, MD   Critical care provider statement:    Critical care time (minutes):  45   Critical care was necessary to treat or prevent imminent or life-threatening deterioration of the following conditions:  Respiratory failure   Critical care was time spent personally by me on the following activities:  Discussions with consultants, evaluation of patient's response to treatment, examination of patient, ordering and performing treatments and interventions, ordering and review of laboratory studies, ordering and review of radiographic studies, pulse oximetry, re-evaluation of patient's condition, obtaining history from patient or surrogate and review of old charts    (including critical care time)  Medical Decision Making / ED Course I have reviewed the nursing notes for this encounter and the patient's prior records (if available in EHR or on provided paperwork).   [place]  Elda Jenkins was evaluated in Emergency Department on 09/20/2020 for the symptoms described in the history of present illness. She was evaluated in the context of the global COVID-19 pandemic, which necessitated consideration that the patient might be at risk for infection with the SARS-CoV-2 virus that causes COVID-19. Institutional protocols and algorithms that pertain to the evaluation of patients at risk for COVID-19 are in a state of  rapid change based on information released by regulatory bodies including the CDC and federal and state organizations. These policies and algorithms were followed during the patient's care in the ED.  Consistent with COPD exacerbation. She is on her 4LNC and satting well, but does have increased WOB and decreased air movement in all lung fields. CXR w/o PNA or PTx. CBC with leukocytosis - likely from steroids. BMP reassuring.  Given solumedrol IV, albuterol and atrovent puffs.  No relief with puffs. Will get COVID, ABG, and give nebs.   ABG reassuring w/o respiratory acidosis.  Will get dimer given persistent tachycardia. Has had negative dimers recently. Also giving IVF.  6:49 AM Reports some improvement with nebs.   Plan to follow up dimer and reassess 1 hour after Neb completion. If dimer negative and patient's WOB is improved, I feel she would be appropriate for discharge home.   Patient care turned over to Dr Ashok Cordia. Patient case and results discussed in detail; please see their note for further ED managment.         Final Clinical Impression(s) / ED Diagnoses Final diagnoses:  COPD exacerbation ([place])      This chart was dictated using voice recognition software.  Despite best efforts to proofread,  errors can occur which can change the documentation meaning.     Fatima Blank, MD 09/20/20 [id number]    Fatima Blank, MD 09/21/20 [phone]

## 2020-09-20 NOTE — H&P (Addendum)
[facility] Admission History and Physical Service Pager: [phone]  Patient name: Mary Jenkins Medical record number: [id number] Date of birth: 1956-05-14 Age: 64 y.o. Gender: female  Primary Care Provider: Matilde Haymaker, MD Consultants: None Code Status: Full  Preferred Emergency Contact:. Contact Information    Name Relation Home Work [place] Daughter [phone]  [phone]   Yamilett, Anastos [phone]  [phone]     Chief Complaint: Shortness of breath   Assessment and Plan: Mary Jenkins is a 64 y.o. female presenting with shortness of breath and chest tightness . PMH is significant for COPD GOLD IV, anemia, MDD, GERD, h/o breast cancer, h/o thoracic vertebral compression fx, cervical spine arthritis, cataract.  Acute on chronic respiratory distress  COPD GOLD IV Patient was recently admitted on 12/18 for COPD exacerbation that required BiPAP, and treated with antibiotics and prednisone taper. She was discharged on 12/24 with steroids and home medications. Patient states her tightness never fully resolved and slowly worsened even with medication compliance. She reports she is on 4L of O2 at baseline (prior notes state 3L), tachycardic to 130 on exam, and tachypneic. In the ED, patient received CAT x1hr, atrovent, solumedrol IV x1, ativan 33m.  Patient is currently on 4.5 L with O2 sat of 100%.  Respiratory panel and COVID negative. ABG pH 7.563, pO2 149, Bicarb 33.3. D-dimer 0.30. CXR without acute disease, leukocytosis noted (patient recently on steroids), Home meds: albuterol, Breztri 2 puffs BID, Daliresp 500 mg daily.  We do not have Breztri on formulary and will substitute with Breo Ellipta and Incruse Ellipta.  Recommendation for antibiotic in the light of recent antibiotic use with ceftriaxone azithromycin and cefepime for 5 days; normally dosed q8h, but due to patient's CrCl <60 the dose is q12h. No previous  echocardiogram. Will obtain to rule out other causes of SOB with normal ABG.  - admit to progressive, attending Dr. BOwens Shark- PT/OT eval and treat - Continuous O2 monitoring with O2 PRN to maintain sats 88-92% - Continuous cardiac monitoring - Albuterol q2h PRN - Breo Ellipta and Incruse Ellipta 1 puff daily each - Daliresp 5042m daily - Cefepime 2 g Q12H x5 days per pharmacy - Prednisone 4048m4 days (day 1/5 for total steroids) - Incentive spirometry encouraged - hydroxyzine 15m50mh PRN for anxiety  Sinus tachycardia Patient is s/p CAT x 1hr and atrovent. She does report feeling her heart racing but it is not contributing to her shortness of breath.  - continuous cardiac monitoring - F/u repeat EKG   Hypokalemia In the ED, patient found to have potassium of 3.3. Patient received 2 treatments with CAT, which likely contributed to the decrease in potassium from 3.8 on 12/28. - Repleted with 40 mEq Kdur - Monitor with daily BMP and further repletion as appropriate  Normocytic anemia Hgb on admission 11.6, baseline appears to be around 11. Home meds include iron 315mg44mly. Likely anemia of chronic disease. -Continue iron supplementation 325 mg daily  GERD Combination of Protonix 40 mg daily. -Continue Protonix  Vertebral compression fx Associated with osteopenia and recently diagnosed on admission in November 2021.  Following with neurosurgery outpatient. Prescribed oxycodone 08/22/20 #28 (patient reports not using at home on med rec with pharmacy) and nasal calcitonin.  - Monitor for pain  FEN/GI: Regular diet Prophylaxis: Lovenox  Disposition: admit to progressive  History of Present Illness:  Mary Jenkins 64 y.46 female presenting with shortness of breath and chest  tightness. She states that she has not felt at her baseline since she was discharged on 12/24. Patient reports that she uses a baseline of 4L at home (though previously documented as 3L). She says  that she still feels very tight in her chest. She has been compliant with her medications including her steroids since her discharge. She has had several episodes of her "body warming up" with subsequent shallow and faster breathing.  Today she went to the bathroom and began having shallower breathing due to the exertion; when she got back into bed she was still struggling quite a bit. Her daughter became concerned and called the ambulance.  Review Of Systems: Per HPI with the following additions:   Review of Systems  Constitutional: Negative for chills, diaphoresis and fever.  Respiratory: Positive for cough, chest tightness and shortness of breath. Negative for choking and wheezing.   Cardiovascular: Negative for chest pain, palpitations and leg swelling.  Gastrointestinal: Negative for abdominal distention, abdominal pain, constipation, diarrhea, nausea and vomiting.  Endocrine: Negative for polyuria.  Genitourinary: Negative for dysuria, frequency and urgency.  Neurological: Negative for weakness, light-headedness and numbness.     Patient Active Problem List   Diagnosis Date Noted  . Protein-calorie malnutrition, severe 09/11/2020  . Acute respiratory failure with hypoxia ([place])   . SOB (shortness of breath) 09/09/2020  . Compression fracture of body of thoracic vertebra ([place]) 08/12/2020  . Back pain 08/12/2020  . Pain   . Chronic obstructive pulmonary disease ([place])   . Palliative care by specialist   . Goals of care, counseling/discussion   . Dysphagia 04/23/2020  . Acute respiratory distress 04/22/2020  . Chills (without fever) 02/23/2020  . Shortness of breath 12/27/2019  . Screening for hyperlipidemia 12/03/2019  . Bruising 07/21/2019  . COPD exacerbation ([place]) 01/11/2019  . Healthcare maintenance 05/23/2018  . Chronic respiratory failure with hypoxia and hypercapnia ([place]) 04/13/2018  . Weight loss 08/02/2017  . Severe episode of recurrent major depressive disorder,  without psychotic features ([place]) 04/25/2017  . Encounter for HCV screening test for low risk patient 03/10/2017  . History of hyperkalemia 02/13/2016  . Skin lesion of back 11/15/2014  . Atypical chest pain 10/21/2014  . Cataract 10/21/2014  . Dyslipidemia, goal to be determined 04/22/2014  . Lipoma 10/07/2012  . Cervical spine arthritis 08/27/2012  . History of breast cancer 07/23/2012  . GERD (gastroesophageal reflux disease) 02/03/2012  . Anemia 07/29/2011  . COPD GOLD IV 06/24/2011  . Liver cyst 06/24/2011  . Insomnia 06/24/2011    Past Medical History: Past Medical History:  Diagnosis Date  . Breast cancer ([place])   . Cataract, immature    bilateral  . COPD (chronic obstructive pulmonary disease) ([place])    no home O2  . Cough 02/09/2015  . DDD (degenerative disc disease), cervical   . Exertional shortness of breath    states if she "takes her time" doing activities does not get SOB  . Family history of adverse reaction to anesthesia    pt's sister has hx. of post-op N/V  . Full dentures   . GERD (gastroesophageal reflux disease) 02/03/2012  . High cholesterol    no current med.  Marland Kitchen History of breast cancer   . Personal history of chemotherapy 1997  . Runny nose 02/09/2015   clear drainage, per pt.    Past Surgical History: Past Surgical History:  Procedure Laterality Date  . BREAST CAPSULOTOMY WITH IMPLANT EXCHANGE Right 06/21/2013   Procedure: REVISION RIGHT BREAST RECONSTRUCTION/REMOVAL  OF RIGHT IMPLANT/RIGHT BREAST CAPSULOTOMY WITH INSERT TISSUE EXPLANDER RIGHT BREAST/POSSIBLE LEFT BREAST MASTOPEXY;  Surgeon: Cristine Polio, MD;  Location: [facility];  Service: Plastics;  Laterality: Right;  . BREAST RECONSTRUCTION Right 06/21/2013   Procedure: BREAST RECONSTRUCTION;  Surgeon: Cristine Polio, MD;  Location: [place];  Service: Plastics;  Laterality: Right;  . BREAST RECONSTRUCTION Left 11/29/2013   Procedure: LEFT MASTOPEXY FOR  RECONSTRUCTION;  Surgeon: Cristine Polio, MD;  Location: [facility];  Service: Plastics;  Laterality: Left;  . BREAST RECONSTRUCTION Right 03/14/2014   Procedure: RECONSTRUCTION NIPPLE RIGHT BREAST;  Surgeon: Cristine Polio, MD;  Location: [facility];  Service: Plastics;  Laterality: Right;  . BREAST SURGERY  97   implant  . CAPSULOTOMY Right 06/30/2017   Procedure: CAPSULOTOMY;  Surgeon: Cristine Polio, MD;  Location: [facility];  Service: Plastics;  Laterality: Right;  . COLONOSCOPY    . ESOPHAGOGASTRODUODENOSCOPY (EGD) WITH PROPOFOL N/A 04/25/2020   Procedure: ESOPHAGOGASTRODUODENOSCOPY (EGD) WITH PROPOFOL;  Surgeon: Milus Banister, MD;  [facility] ENDOSCOPY;  Service: Endoscopy;  Laterality: N/A;  . MASTECTOMY, PARTIAL Right 1997   -node dissection  . REDUCTION MAMMAPLASTY Left   . REMOVAL OF TISSUE EXPANDER AND PLACEMENT OF IMPLANT Right 02/13/2015   Procedure: REMOVAL OF TISSUE  EXPANDER  PORT RIGHT BREAST;  Surgeon: Cristine Polio, MD;  Location: [facility];  Service: Plastics;  Laterality: Right;  . REMOVAL OF TISSUE EXPANDER AND PLACEMENT OF IMPLANT Right 06/30/2017   Procedure: REMOVAL OF RIGHT TISSUE EXPANDER WITH PLACEMENT OF RIGHT GELL BREAST IMPLANTS;  Surgeon: Cristine Polio, MD;  Location: [facility];  Service: Plastics;  Laterality: Right;  . SCAR REVISION Right 02/13/2015   Procedure: SCAR REVISION RIGHT BREAST;  Surgeon: Cristine Polio, MD;  Location: [facility];  Service: Plastics;  Laterality: Right;  . TISSUE EXPANDER PLACEMENT Right 03/14/2014   Procedure: SALINE REMOVAL RIGHT TISSUE EXPANDER;  Surgeon: Cristine Polio, MD;  Location: [facility];  Service: Plastics;  Laterality: Right;  . TUBAL LIGATION  1980's    Social History: Social History   Tobacco Use  . Smoking status: Former Smoker    Quit date: 07/23/2012    Years since quitting: 8.1  .  Smokeless tobacco: Never Used  Vaping Use  . Vaping Use: Never used  Substance Use Topics  . Alcohol use: No    Alcohol/week: 0.0 standard drinks    Comment: one beer every now and then  . Drug use: No   Additional social history:   Please also refer to relevant sections of EMR.  Family History: Family History  Problem Relation Age of Onset  . Stroke Mother   . Heart disease Brother   . Heart disease Brother   . Anesthesia problems Sister        post-op N/V  . Cancer Sister        Endometrial CA  . Heart disease Brother   . Tuberculosis Daughter        not active  . Allergic rhinitis Daughter   . Allergic rhinitis Son   . Hypertension Sister   . Diabetes Sister   . Breast cancer Neg Hx      Allergies and Medications: No Known Allergies No current facility-administered medications on file prior to encounter.   Current Outpatient Medications on File Prior to Encounter  Medication Sig Dispense Refill  . albuterol (PROAIR HFA) 108 (90 Base) MCG/ACT inhaler Inhale  2 puffs into the lungs every 6 (six) hours as needed for wheezing or shortness of breath. 18 g 3  . albuterol (PROVENTIL) (2.5 MG/3ML) 0.083% nebulizer solution Take 3 mLs (2.5 mg total) by nebulization every 6 (six) hours as needed for wheezing or shortness of breath. 75 mL 6  . aspirin EC 81 MG tablet Take 81 mg by mouth daily. Swallow whole.    . Budeson-Glycopyrrol-Formoterol (BREZTRI AEROSPHERE) 160-9-4.8 MCG/ACT AERO Inhale 2 puffs into the lungs 2 (two) times daily. 10.7 g 0  . calcitonin, salmon, (MIACALCIN/FORTICAL) 200 UNIT/ACT nasal spray Place 1 spray into alternate nostrils daily. 3.7 mL 0  . cycloSPORINE (RESTASIS) 0.05 % ophthalmic emulsion Place 1 drop into both eyes 2 (two) times daily.     . ferrous sulfate 324 (65 Fe) MG TBEC Take 1 tablet (325 mg total) by mouth daily. OK to take every other day if it is constipating. (Patient taking differently: Take 1 tablet by mouth every other day.) 60 tablet  1  . hydrOXYzine (ATARAX/VISTARIL) 25 MG tablet Take 1 tablet (25 mg total) by mouth every 6 (six) hours as needed for anxiety. 15 tablet 0  . naloxone (NARCAN) 4 MG/0.1ML LIQD nasal spray kit Place 1 spray into the nose once as needed (opioid overdose).    Marland Kitchen oxyCODONE (OXY IR/ROXICODONE) 5 MG immediate release tablet Take 5 mg by mouth every 4 (four) hours as needed for severe pain.    . OXYGEN Place 4 L into the nose continuous.    . pantoprazole (PROTONIX) 40 MG tablet Take 1 tablet (40 mg total) by mouth daily. 30 tablet 6  . predniSONE (DELTASONE) 1 MG tablet 09/21/20 take 5 tablets (52m); 09/22/20 and 09/23/20 take two and a half tablets (2.520m; 09/24/20 and 09/25/20 take one tablet (12m44m Then stop 12 tablet 0  . predniSONE (DELTASONE) 20 MG tablet 12/25 take 2 and a half tablets (105m75m12/26 take 2 tablets (40mg15m2/27 take 1 and a half tablets (30mg)40m/28 take 1 tablet (20mg);66m29 take half a tablet (10mg) 8312mlet 0  . roflumilast (DALIRESP) 500 MCG TABS tablet Take 1 tablet (500 mcg total) by mouth daily. 30 tablet 11  . valACYclovir (VALTREX) 1000 MG tablet TAKE 1 TABLET BY MOUTH TWICE DAILY FOR 3 DAYS AS NEEDED (Patient taking differently: Take 1,000 mg by mouth See admin instructions. Take one tablet (1000 mg) by mouth twice daily for 3 days - as needed for outbreaks) 30 tablet 5    Objective: BP 104/74   Pulse (!) 127   Temp 98.2 F (36.8 C) (Oral)   Resp 20   Ht _0  (1.676 m)   Wt 46.3 kg   SpO2 100%   BMI 16.46 kg/m  Exam: General -- oriented x3, pleasant and cooperative. HEENT -- normocephalic, PERRLA, EOMI Neck -- supple; no bruits. Integument -- intact. No rash, erythema, or ecchymoses.  Chest -- decreased air movement bilaterally, no wheezes appreciated, on 4.5L [place] Cardiac -- tachycardic at 130 on tele, no m/r/g appreciated .  Abdomen -- soft, nontender. No masses palpable. Bowel sounds present. Genital, rectal and breast exam -- deferred. Neuro -- A&Ox4,   Extremeties - no tenderness or edema noted. Dorsalis pedis pulses present and symmetrical.    Labs and Imaging: CBC BMET  Recent Labs  Lab 09/11/2020 1345 09/20/20 0620  WBC 12.1*  --   HGB 11.1* 11.6*  HCT 35.0* 34.0*  PLT 332  --    Recent Labs  Lab 08/23/2020  1345 09/20/20 0620  NA 139 138  K 3.8 3.3*  CL 99  --   CO2 29  --   BUN <5*  --   CREATININE 0.72  --   GLUCOSE 150*  --   CALCIUM 9.0  --      EKG: Sinus tachycardia, difficult to examine image of EKG--will get repeat   DG Chest 2 View  Result Date: 09/20/2020 CLINICAL DATA:  Shortness of breath EXAM: CHEST - 2 VIEW COMPARISON:  09/09/2020 FINDINGS: The lungs are hyperinflated with diffuse interstitial prominence. No focal airspace consolidation or pulmonary edema. No pleural effusion or pneumothorax. Normal cardiomediastinal contours. IMPRESSION: COPD without acute airspace disease. Electronically Signed   By: Ulyses Jarred M.D.   On: 09/20/2020 02:25     [place]ise Patience, DO 09/20/2020, 11:59 AM PGY-1, [place] Intern pager: [phone], text pages welcome   FPTS Upper-Level Resident Addendum I have independently interviewed and examined the patient. I have discussed the above with the original author and agree with their documentation. My edits for correction/addition/clarification are in - dark green. Please see also any attending notes.  [place] Service pager: [phone] (text pages welcome through AMION)  Wilber Oliphant, M.D.  PGY-3 09/20/2020 5:01 PM

## 2020-09-21 DIAGNOSIS — K219 Gastro-esophageal reflux disease without esophagitis: Secondary | ICD-10-CM | POA: Diagnosis present

## 2020-09-21 DIAGNOSIS — J441 Chronic obstructive pulmonary disease with (acute) exacerbation: Secondary | ICD-10-CM | POA: Diagnosis present

## 2020-09-21 DIAGNOSIS — Z833 Family history of diabetes mellitus: Secondary | ICD-10-CM | POA: Diagnosis not present

## 2020-09-21 DIAGNOSIS — Z515 Encounter for palliative care: Secondary | ICD-10-CM | POA: Diagnosis not present

## 2020-09-21 DIAGNOSIS — Z853 Personal history of malignant neoplasm of breast: Secondary | ICD-10-CM | POA: Diagnosis not present

## 2020-09-21 DIAGNOSIS — F332 Major depressive disorder, recurrent severe without psychotic features: Secondary | ICD-10-CM | POA: Diagnosis present

## 2020-09-21 DIAGNOSIS — M4850XA Collapsed vertebra, not elsewhere classified, site unspecified, initial encounter for fracture: Secondary | ICD-10-CM | POA: Diagnosis present

## 2020-09-21 DIAGNOSIS — Z7189 Other specified counseling: Secondary | ICD-10-CM | POA: Diagnosis not present

## 2020-09-21 DIAGNOSIS — T380X5A Adverse effect of glucocorticoids and synthetic analogues, initial encounter: Secondary | ICD-10-CM | POA: Diagnosis present

## 2020-09-21 DIAGNOSIS — M8580 Other specified disorders of bone density and structure, unspecified site: Secondary | ICD-10-CM | POA: Diagnosis present

## 2020-09-21 DIAGNOSIS — R110 Nausea: Secondary | ICD-10-CM | POA: Diagnosis present

## 2020-09-21 DIAGNOSIS — Z9221 Personal history of antineoplastic chemotherapy: Secondary | ICD-10-CM | POA: Diagnosis not present

## 2020-09-21 DIAGNOSIS — J9621 Acute and chronic respiratory failure with hypoxia: Secondary | ICD-10-CM | POA: Diagnosis present

## 2020-09-21 DIAGNOSIS — D638 Anemia in other chronic diseases classified elsewhere: Secondary | ICD-10-CM | POA: Diagnosis present

## 2020-09-21 DIAGNOSIS — F419 Anxiety disorder, unspecified: Secondary | ICD-10-CM | POA: Diagnosis present

## 2020-09-21 DIAGNOSIS — M47812 Spondylosis without myelopathy or radiculopathy, cervical region: Secondary | ICD-10-CM | POA: Diagnosis present

## 2020-09-21 DIAGNOSIS — Z66 Do not resuscitate: Secondary | ICD-10-CM | POA: Diagnosis present

## 2020-09-21 DIAGNOSIS — Z9011 Acquired absence of right breast and nipple: Secondary | ICD-10-CM | POA: Diagnosis not present

## 2020-09-21 DIAGNOSIS — Z20822 Contact with and (suspected) exposure to covid-19: Secondary | ICD-10-CM | POA: Diagnosis present

## 2020-09-21 DIAGNOSIS — Z87891 Personal history of nicotine dependence: Secondary | ICD-10-CM | POA: Diagnosis not present

## 2020-09-21 DIAGNOSIS — J9612 Chronic respiratory failure with hypercapnia: Secondary | ICD-10-CM | POA: Diagnosis present

## 2020-09-21 DIAGNOSIS — E876 Hypokalemia: Secondary | ICD-10-CM | POA: Diagnosis present

## 2020-09-21 DIAGNOSIS — Z79899 Other long term (current) drug therapy: Secondary | ICD-10-CM | POA: Diagnosis not present

## 2020-09-21 DIAGNOSIS — J9611 Chronic respiratory failure with hypoxia: Secondary | ICD-10-CM | POA: Diagnosis not present

## 2020-09-21 DIAGNOSIS — D72829 Elevated white blood cell count, unspecified: Secondary | ICD-10-CM | POA: Diagnosis present

## 2020-09-21 DIAGNOSIS — M5030 Other cervical disc degeneration, unspecified cervical region: Secondary | ICD-10-CM | POA: Diagnosis present

## 2020-09-21 DIAGNOSIS — R0602 Shortness of breath: Secondary | ICD-10-CM | POA: Diagnosis present

## 2020-09-21 MED ORDER — ALBUTEROL SULFATE (2.5 MG/3ML) 0.083% IN NEBU
2.5000 mg | INHALATION_SOLUTION | Freq: Four times a day (QID) | RESPIRATORY_TRACT | Status: DC
  Filled 2020-09-21: qty 3

## 2020-09-21 MED ORDER — PROCHLORPERAZINE MALEATE 5 MG PO TABS
5.0000 mg | ORAL_TABLET | Freq: Four times a day (QID) | ORAL | Status: DC | PRN
  Filled 2020-09-21 (×2): qty 1

## 2020-09-21 NOTE — Progress Notes (Signed)
Family Medicine Teaching Service Daily Progress Note Intern Pager: [phone]  Patient name: Mary Jenkins Medical record number: [id number] Date of birth: 1956/02/28 Age: 64 y.o. Gender: female  Primary Care Provider: Matilde Haymaker, MD Consultants: Palliative Code Status: DNR, comfort care  Pt Overview and Major Events to Date:  12/30 - Admitted for COPD exacerbation 12/30 - transitioned to comfort care  Assessment and Plan: Mary Jenkins is a 64 year old woman who was admitted to the hospital for COPD exacerbation.  Since admission, she has decided to transition to comfort care.  Her previous medical history is significant for chronic hypoxic respiratory failure, breast cancer, depression, anxiety.  Transition to comfort care Mary Jenkins had an extended conversation with palliative care which involved the palliative care team and the family of Mary Jenkins.  It was decided to transition to comfort care.  Comfort measures have been placed and we will avoid further investigations for reversible problems and focus are energy on comfort measures. -Morphine for air hunger/anxiety -Zofran as needed -Ativan -Glycopyrrolate  COPD exacerbation Breathing comfortably on room air.  This is likely in part due to her anxiolytics and morphine none that she has been transition to comfort care.  We will continue treating her for COPD exacerbation without taking further steps to monitor or investigate. -No follow-up BMP, CBC -Continue cefepime, second day of 5 -Continue prednisone, secondary to 5 -Breo Ellipta daily -Incruse Ellipta daily -DuoNebs every 4 hours scheduled, every 2 as needed  Hypokalemia No further work-up or treatment due to transition to comfort care.  GERD -Protonix  Vertebral compression fracture -Pain medicine as above  FEN/GI: regular PPx: None  Disposition: Will likely expire in the hospital unless she can be stabilized and transitioned to home hospice in the next  several days.  Subjective:  Transitioned to comfort care overnight, significantly more comfortable with morphine boluses. After several more morphine boluses, she was transitioned to a morphine drip overnight which self with air hunger and decreased anxiety. This morning, she notes that she feels comfortable and has no specific complaints or concerns.  Objective: Temp:  [98.1 F (36.7 C)-98.4 F (36.9 C)] 98.1 F (36.7 C) (12/30 0011) Pulse Rate:  [83-154] 94 (12/30 0011) Resp:  [9-29] 17 (12/30 0011) BP: (91-141)/(57-130) 133/73 (12/30 0011) SpO2:  [99 %-100 %] 100 % (12/30 0011)  Physical Exam: General: Alert and cooperative and appears to be in no acute distress. Lying in bed comfortably. No acute distress. Nasal cannula in place. Pulm: Breathing comfortably with nasal cannula in place. No acute distress. Neuro: Cranial nerves grossly intact   Laboratory: Recent Labs  Lab 09/11/2020 1345 09/20/20 0620  WBC 12.1*  --   HGB 11.1* 11.6*  HCT 35.0* 34.0*  PLT 332  --    Recent Labs  Lab 08/27/2020 1345 09/20/20 0620  NA 139 138  K 3.8 3.3*  CL 99  --   CO2 29  --   BUN <5*  --   CREATININE 0.72  --   CALCIUM 9.0  --   GLUCOSE 150*  --     Imaging/Diagnostic Tests: No results found.   Matilde Haymaker, MD 09/21/2020, 2:25 AM PGY-3, [place] Intern pager: [phone], text pages welcome

## 2020-09-21 NOTE — Progress Notes (Signed)
Went to see patient regarding nausea.  Patient reports that the nausea has improved at this time.  We discussed switching medications and patient is open to it.  We reached out to palliative care and they are planning on seeing her either tonight or tomorrow.  They recommended Compazine or Haldol for the nausea if the Zofran is not working.  They also said that we could switch to Dilaudid drip.  We have added Compazine 5 mg every 6 as needed for nausea.  I also discussed possible home hospice with the patient.  The patient reports that she does not want to be discharged to go home with home hospice.  We will have further discussions with palliative care regarding dispo plans.

## 2020-09-22 DIAGNOSIS — J9611 Chronic respiratory failure with hypoxia: Secondary | ICD-10-CM | POA: Diagnosis not present

## 2020-09-22 DIAGNOSIS — Z515 Encounter for palliative care: Secondary | ICD-10-CM | POA: Diagnosis not present

## 2020-09-22 DIAGNOSIS — R52 Pain, unspecified: Secondary | ICD-10-CM

## 2020-09-22 DIAGNOSIS — J9621 Acute and chronic respiratory failure with hypoxia: Secondary | ICD-10-CM | POA: Diagnosis not present

## 2020-09-22 DIAGNOSIS — J441 Chronic obstructive pulmonary disease with (acute) exacerbation: Secondary | ICD-10-CM | POA: Diagnosis not present

## 2020-09-22 DIAGNOSIS — Z789 Other specified health status: Secondary | ICD-10-CM

## 2020-09-22 MED ORDER — PROCHLORPERAZINE EDISYLATE 10 MG/2ML IJ SOLN
5.0000 mg | INTRAMUSCULAR | Status: DC | PRN
  Administered 2020-09-22 – 2020-09-23 (×2): 5 mg via INTRAVENOUS
  Filled 2020-09-22 (×4): qty 1

## 2020-09-22 MED ORDER — HYDROCORTISONE 1 % EX CREA
TOPICAL_CREAM | Freq: Four times a day (QID) | CUTANEOUS | Status: DC | PRN
  Filled 2020-09-22: qty 28

## 2020-09-22 MED ORDER — ALBUTEROL SULFATE (2.5 MG/3ML) 0.083% IN NEBU
2.5000 mg | INHALATION_SOLUTION | Freq: Three times a day (TID) | RESPIRATORY_TRACT | Status: DC
  Administered 2020-09-22 – 2020-09-26 (×12): 2.5 mg via RESPIRATORY_TRACT
  Filled 2020-09-22 (×12): qty 3

## 2020-09-22 NOTE — Progress Notes (Signed)
Family Medicine Teaching Service Daily Progress Note Intern Pager: [phone]  Patient name: Mary Jenkins Medical record number: [id number] Date of birth: [birth year] Age: 64 y.o. Gender: female  Primary Care Provider: Matilde Haymaker, MD Consultants: Palliative Code Status: DNR, comfort care  Pt Overview and Major Events to Date:  12/30 - Admitted for COPD exacerbation 12/30 - transitioned to comfort care  Assessment and Plan: Mary Jenkins is a 64 year old woman who was admitted to the hospital for COPD exacerbation.  Since admission, she has decided to transition to comfort care.  Her previous medical history is significant for chronic hypoxic respiratory failure, breast cancer, depression, anxiety.  Transition to comfort care Ms. Vickroy had an extended conversation with palliative care which involved the palliative care team and the family of Ms. Moldovan.  It was decided to transition to comfort care.  Comfort measures have been placed and we will avoid further investigations for reversible problems and focus are energy on comfort measures.  -Morphine for air hunger/anxiety -Transition to Compazine from Zofran for nausea -Ativan -Glycopyrrolate  COPD exacerbation Breathing comfortably on room air and having apneic spells. This is likely in part due to her anxiolytics and morphine none that she has been transition to comfort care. COPD exacerbation treatments discontinued yesterday -Breo Ellipta daily -Incruse Ellipta daily -DuoNebs every 4 hours scheduled, every 2 as needed  Hypokalemia No further work-up or treatment due to transition to comfort care.  GERD -Protonix  Vertebral compression fracture -Pain medicine as above  FEN/GI: regular PPx: None  Disposition: Plan for inpatient hospice at this time  Subjective:  Some nausea overnight.  IV Compazine provided.  This is helpful per patient report.  This morning, she notes that she does have some mild shortness of breath  but no nausea or abdominal pain at present.  Objective: Temp:  [98.3 F (36.8 C)-98.6 F (37 C)] 98.6 F (37 C) (12/31 2006) Pulse Rate:  [90-95] 95 (12/31 2101) Resp:  [16-18] 16 (12/31 2101) BP: (123-130)/(63-68) 130/63 (12/31 2006) SpO2:  [94 %-100 %] 99 % (12/31 2101)  Physical Exam: General: Resting in bed comfortably.  Nasal cannula in place.  No acute distress.  Fatigued but responsive to questions. Respiratory: 4 L nasal cannula.  No significant labored breathing.  Low respiratory rate.   Laboratory: Recent Labs  Lab 08/31/2020 1345 09/20/20 0620  WBC 12.1*  --   HGB 11.1* 11.6*  HCT 35.0* 34.0*  PLT 332  --    Recent Labs  Lab 08/23/2020 1345 09/20/20 0620  NA 139 138  K 3.8 3.3*  CL 99  --   CO2 29  --   BUN <5*  --   CREATININE 0.72  --   CALCIUM 9.0  --   GLUCOSE 150*  --     Imaging/Diagnostic Tests: No results found.   Matilde Haymaker, MD 09/22/2020, 11:44 PM PGY-2, [place] Intern pager: [phone], text pages welcome

## 2020-09-22 NOTE — Plan of Care (Signed)
  Problem: Elimination: Goal: Will not experience complications related to bowel motility Outcome: Completed/Met Goal: Will not experience complications related to urinary retention Outcome: Completed/Met   Problem: Pain Managment: Goal: General experience of comfort will improve Outcome: Completed/Met   Problem: Skin Integrity: Goal: Risk for impaired skin integrity will decrease Outcome: Completed/Met

## 2020-09-22 NOTE — Progress Notes (Signed)
Family Medicine Teaching Service Daily Progress Note Intern Pager: [phone]  Patient name: Mary Jenkins Medical record number: [id number] Date of birth: 05-Oct-1955 Age: 64 y.o. Gender: female  Primary Care Provider: Matilde Haymaker, MD Consultants: Palliative Code Status: DNR, comfort care  Pt Overview and Major Events to Date:  12/30 - Admitted for COPD exacerbation 12/30 - transitioned to comfort care  Assessment and Plan: Mary Jenkins is a 64 year old woman who was admitted to the hospital for COPD exacerbation.  Since admission, she has decided to transition to comfort care.  Her previous medical history is significant for chronic hypoxic respiratory failure, breast cancer, depression, anxiety.  Transition to comfort care Ms. Backs had an extended conversation with palliative care which involved the palliative care team and the family of Mary Jenkins.  It was decided to transition to comfort care.  Comfort measures have been placed and we will avoid further investigations for reversible problems and focus are energy on comfort measures. Discussed if the patient was comfortable with going home with home hospice and patient reported she does not wish to go home. -Morphine for air hunger/anxiety -Transition to Compazine from Zofran for nausea -Ativan -Glycopyrrolate  COPD exacerbation Breathing comfortably on room air and having apneic spells. This is likely in part due to her anxiolytics and morphine none that she has been transition to comfort care. COPD exacerbation treatments discontinued yesterday -No follow-up BMP, CBC -Discontinue cefepime and prednisone -Breo Ellipta daily -Incruse Ellipta daily -DuoNebs every 4 hours scheduled, every 2 as needed  Hypokalemia No further work-up or treatment due to transition to comfort care.  GERD -Protonix  Vertebral compression fracture -Pain medicine as above  FEN/GI: regular PPx: None  Disposition: Plan for inpatient hospice  at this time  Subjective:  Patient is now comfort care. She is feeling okay at this time and feels like her breathing is doing "okay". During our discussion she had a 15-second apneic spell. No needs or concerns at this time.  Objective: Temp:  [97.2 F (36.2 C)-98.3 F (36.8 C)] 98.3 F (36.8 C) (12/30 2242) Pulse Rate:  [84-96] 96 (12/30 2242) Resp:  [16-18] 18 (12/30 2242) BP: (116-135)/(96-100) 135/96 (12/30 2242) SpO2:  [98 %-100 %] 100 % (12/30 2242)  Physical Exam: General: Resting comfortably in bed, intermittently falling asleep while talking with me Pulm: Breathing comfortably, nasal cannula is in place at this time. Occasional apneic spells while talking Neuro: Cranial nerves grossly intact   Laboratory: Recent Labs  Lab 08/24/2020 1345 09/20/20 0620  WBC 12.1*  --   HGB 11.1* 11.6*  HCT 35.0* 34.0*  PLT 332  --    Recent Labs  Lab 09/22/2020 1345 09/20/20 0620  NA 139 138  K 3.8 3.3*  CL 99  --   CO2 29  --   BUN <5*  --   CREATININE 0.72  --   CALCIUM 9.0  --   GLUCOSE 150*  --     Imaging/Diagnostic Tests: No results found.   Gifford Shave, MD 09/22/2020, 7:22 AM PGY-2, [place] Intern pager: [phone], text pages welcome

## 2020-09-22 NOTE — Progress Notes (Signed)
Daily Progress Note   Patient Name: Mary Jenkins       Date: 09/22/2020 DOB: Feb 17, 1956  Age: 64 y.o. MRN#: [id number] Attending Physician: Martyn Malay, MD Primary Care Physician: Matilde Haymaker, MD Admit Date: 09/18/2020  Reason for Consultation/Follow-up: Non pain symptom management, Pain control, Psychosocial/spiritual support and Terminal Care  Subjective: Chart review performed. Received report from primary RN - no acute concerns. RN states patient has minimal PO intake - only accepting small bites and small sips of liquid.  Went to visit patient at bedside - no family/visitors present. Patient was lying in bed asleep - she did wake to voice/gentle touch but remained lethargic throughout visit. No signs or non-verbal gestures of pain or discomfort noted. No respiratory distress, increased work of breathing, or secretions noted. Patient did endorse having neck pain - morphine bolus provided. Encouraged her to notify RN of any pain/discomfort. She denied shortness of breath. Patient stated "I just want to sleep." Explained that the extra dose of morphine would likely make her more sleepy.   During visit, noted a 30 second apneic episode. Patient respiratory rate was 3.   12:20 PM  Called daughter to provide update and to answer any questions. Explained that the patient was not stable for transfer at this time and we would continue to provided EOL comfort care in house - she was agreeable with this. Prognostication was discussed per daughters request - explained the patient likely had hours to days with her current respiratory rate. She wanted to know if family from out of town should be called in - I encouraged her to start notifying family so if they desired to visit, they could.  Explained that the patient was having some wakeful moments, which could be comforting for those visiting, but I don't know how much longer that would last. Mary Jenkins expressed that she would start notifying family out of town and expressed appreciation for phone call this afternoon.   All questions and concerns addressed. Encouraged to call with questions and/or concerns. PMT number provided.   Length of Stay: 1  Current Medications: Scheduled Meds:  . albuterol  2.5 mg Nebulization TID  . cycloSPORINE  1 drop Both Eyes BID  . fluticasone furoate-vilanterol  1 puff Inhalation Daily  . roflumilast  500 mcg Oral Daily  . sodium  chloride flush  3 mL Intravenous Q12H  . umeclidinium bromide  1 puff Inhalation Daily    Continuous Infusions: . sodium chloride    . morphine 2 mg/hr (09/20/20 2353)    PRN Meds: sodium chloride, acetaminophen **OR** acetaminophen, albuterol, antiseptic oral rinse, glycopyrrolate **OR** glycopyrrolate **OR** glycopyrrolate, LORazepam, morphine, polyvinyl alcohol, prochlorperazine, sodium chloride flush  Physical Exam Vitals and nursing note reviewed.  Constitutional:      General: She is not in acute distress.    Appearance: She is ill-appearing.  Pulmonary:     Effort: No respiratory distress.  Skin:    General: Skin is warm and dry.  Neurological:     Mental Status: She is lethargic.     Motor: Weakness present.  Psychiatric:        Attention and Perception: Attention normal.        Behavior: Behavior is cooperative.             Vital Signs: BP (!) 135/96 (BP Location: Right Arm)   Pulse 96   Temp 98.3 F (36.8 C) (Oral)   Resp 18   Ht _0  (1.676 m)   Wt 46.3 kg   SpO2 100%   BMI 16.46 kg/m  SpO2: SpO2: 100 % O2 Device: O2 Device: Nasal Cannula O2 Flow Rate: O2 Flow Rate (L/min): 4 L/min  Intake/output summary:   Intake/Output Summary (Last 24 hours) at 09/22/2020 1114 Last data filed at 09/22/2020 0900 Gross per 24 hour  Intake  473.54 ml  Output 300 ml  Net 173.54 ml   LBM: Last BM Date: 09/20/20 Baseline Weight: Weight: 46.3 kg Most recent weight: Weight: 46.3 kg       Palliative Assessment/Data: PPS 20%      Patient Active Problem List   Diagnosis Date Noted  . COPD with acute exacerbation ([place]) 09/21/2020  . Protein-calorie malnutrition, severe 09/11/2020  . Acute respiratory failure with hypoxia ([place])   . SOB (shortness of breath) 09/09/2020  . Compression fracture of body of thoracic vertebra ([place]) 08/12/2020  . Back pain 08/12/2020  . Pain   . Chronic obstructive pulmonary disease ([place])   . Palliative care by specialist   . Goals of care, counseling/discussion   . Dysphagia 04/23/2020  . Acute respiratory distress 04/22/2020  . Chills (without fever) 02/23/2020  . Shortness of breath 12/27/2019  . Screening for hyperlipidemia 12/03/2019  . Bruising 07/21/2019  . COPD exacerbation ([place]) 01/11/2019  . Healthcare maintenance 05/23/2018  . Chronic respiratory failure with hypoxia and hypercapnia ([place]) 04/13/2018  . Weight loss 08/02/2017  . Severe episode of recurrent major depressive disorder, without psychotic features ([place]) 04/25/2017  . Encounter for HCV screening test for low risk patient 03/10/2017  . History of hyperkalemia 02/13/2016  . Skin lesion of back 11/15/2014  . Atypical chest pain 10/21/2014  . Cataract 10/21/2014  . Dyslipidemia, goal to be determined 04/22/2014  . Lipoma 10/07/2012  . Cervical spine arthritis 08/27/2012  . History of breast cancer 07/23/2012  . GERD (gastroesophageal reflux disease) 02/03/2012  . Anemia 07/29/2011  . COPD GOLD IV 06/24/2011  . Liver cyst 06/24/2011  . Insomnia 06/24/2011    Palliative Care Assessment & Plan   Patient Profile: 64 y.o. female  with past medical history of advanced COPD (GOLD IV), breast cancer (1997, s/p right mastectomy), anemia, and vertebral compression fracture secondary to osetopenia presenting to the  emergency department on 12/28/2021with shortness of breath and chest tightness. She was recently hospitalized 12/18--12/24 with COPD  exacerbation.  ED Course: Dyspneic on exam, normal chest x-ray, no hypercarbia noted on ABG, negative D-dimer. Admitted to IM teaching service for acute on chronic hypoxemic respiratory failure likely due to worsening/exacerbation of COPD. Palliative Medicine was consulted per patient request.   Assessment: COPD exacerbation Hypokalemia Vertebral compression fracture  Recommendations/Plan: Continue full comfort measures - patient likely has hours to days Continue DNR/DNI as previously documented Patient is not stable enough for transfer - anticipate hospital death Continue current EOL medication regimen, patient appears comfortable  Provide frequent assessments and administer PRN medications as clinically necessary to ensure EOL comfort PMT will continue to follow holistically   Goals of Care and Additional Recommendations:  Limitations on Scope of Treatment: Full Comfort Care  Code Status:    Code Status Orders  (From admission, onward)         Start     Ordered   09/20/20 1919  Do not attempt resuscitation (DNR)  Continuous       Question Answer Comment  In the event of cardiac or respiratory ARREST Do not call a "code blue"   In the event of cardiac or respiratory ARREST Do not perform Intubation, CPR, defibrillation or ACLS   In the event of cardiac or respiratory ARREST Use medication by any route, position, wound care, and other measures to relive pain and suffering. May use oxygen, suction and manual treatment of airway obstruction as needed for comfort.      09/20/20 1919        Code Status History    Date Active Date Inactive Code Status Order ID Comments User Context   09/20/2020 1435 09/20/2020 1919 Full Code [id number]  Wilber Oliphant, MD ED   09/09/2020 2218 09/15/2020 1953 Full Code [id number]  Richarda Osmond, DO ED    09/09/2020 2147 09/09/2020 2218 Full Code [id number]  Richarda Osmond, DO ED   08/12/2020 1604 08/16/2020 1854 Full Code [id number]  Armando Reichert, MD ED   04/22/2020 0004 04/25/2020 2131 Full Code [id number]  Ezequiel Essex, MD ED   12/27/2019 2110 12/28/2019 1914 Full Code [id number]  Carollee Leitz, MD ED   01/11/2019 1332 01/12/2019 1435 Full Code [id number]  Jean Rosenthal, MD ED   02/01/2018 1454 02/02/2018 1954 DNR [id number]  Lorella Nimrod, MD ED   03/12/2013 1852 03/13/2013 1748 Full Code [id number]  Trish Fountain, MD Inpatient   Advance Care Planning Activity       Prognosis:   Hours - Days  Discharge Planning:  Anticipated Hospital Death  Care plan was discussed with primary RN, Dr. Caron Presume, patient, patient's daughter  Thank you for allowing the Palliative Medicine Team to assist in the care of this patient.   Total Time 25 minutes Prolonged Time Billed  no       Greater than 50%  of this time was spent counseling and coordinating care related to the above assessment and plan.  Lin Landsman, NP  Please contact Palliative Medicine Team phone at [phone] for questions and concerns.

## 2020-09-23 DIAGNOSIS — J441 Chronic obstructive pulmonary disease with (acute) exacerbation: Secondary | ICD-10-CM | POA: Diagnosis not present

## 2020-09-23 DIAGNOSIS — J9611 Chronic respiratory failure with hypoxia: Secondary | ICD-10-CM

## 2020-09-23 DIAGNOSIS — Z515 Encounter for palliative care: Secondary | ICD-10-CM | POA: Diagnosis not present

## 2020-09-23 DIAGNOSIS — J9612 Chronic respiratory failure with hypercapnia: Secondary | ICD-10-CM

## 2020-09-23 DIAGNOSIS — J9621 Acute and chronic respiratory failure with hypoxia: Secondary | ICD-10-CM | POA: Diagnosis not present

## 2020-09-23 NOTE — Progress Notes (Signed)
Pt continues to appear comfortable with morphine drip at 2 and family is at bedside. Obtained comfort cart for family and showed them where dept beverages were.

## 2020-09-23 NOTE — Progress Notes (Signed)
Pt arrived  Via bed, with family at bedside.  Morphine drip at 2, pt appears comfortable.

## 2020-09-23 DEATH — deceased

## 2020-09-24 DIAGNOSIS — J9611 Chronic respiratory failure with hypoxia: Secondary | ICD-10-CM | POA: Diagnosis not present

## 2020-09-24 DIAGNOSIS — J441 Chronic obstructive pulmonary disease with (acute) exacerbation: Secondary | ICD-10-CM | POA: Diagnosis not present

## 2020-09-24 DIAGNOSIS — J9621 Acute and chronic respiratory failure with hypoxia: Secondary | ICD-10-CM | POA: Diagnosis not present

## 2020-09-24 DIAGNOSIS — J9612 Chronic respiratory failure with hypercapnia: Secondary | ICD-10-CM | POA: Diagnosis not present

## 2020-09-24 MED ORDER — DIPHENHYDRAMINE HCL 50 MG/ML IJ SOLN
12.5000 mg | Freq: Four times a day (QID) | INTRAMUSCULAR | Status: DC | PRN
  Filled 2020-09-24: qty 1

## 2020-09-24 MED ORDER — CHLORHEXIDINE GLUCONATE CLOTH 2 % EX PADS
6.0000 | MEDICATED_PAD | Freq: Every day | CUTANEOUS | Status: DC
  Administered 2020-09-24 – 2020-09-26 (×2): 6 via TOPICAL

## 2020-09-24 NOTE — Progress Notes (Signed)
Pt appears comfortable with morphine drip at 3 and Ativan as needed.  Booklet explaining the dying process given to patient's daughter and she was appreciative.  Comfort cart ordered for room.

## 2020-09-24 NOTE — Progress Notes (Signed)
Family Medicine Teaching Service Daily Progress Note Intern Pager: [phone]  Patient name: Mary Jenkins Medical record number: [id number] Date of birth: Aug 26, 1956 Age: 65 y.o. Gender: female  Primary Care Provider: Matilde Haymaker, MD Consultants: Palliative Code Status: DNR, comfort care  Pt Overview and Major Events to Date:  12/30 - Admitted for COPD exacerbation 12/30 - transitioned to comfort care  Assessment and Plan: Mary Jenkins is a 65 year old woman who was admitted to the hospital for COPD exacerbation.  Since admission, she has decided to transition to comfort care.  Her previous medical history is significant for chronic hypoxic respiratory failure, breast cancer, depression, anxiety.   Transition to comfort care Mary Jenkins had an extended conversation with palliative care which involved the palliative care team and the family of Mary Jenkins.  It was decided to transition to comfort care.  Comfort measures have been placed and we will avoid further investigations for reversible problems and focus are energy on comfort measures.  -Morphine for air hunger/anxiety -Transition to Compazine from Zofran for nausea -Ativan -Glycopyrrolate  COPD exacerbation Breathing comfortably on [place] and having apneic spells. This is likely in part due to her anxiolytics and morphine none that she has been transition to comfort care. COPD exacerbation treatments discontinued when pt transitioned to hospice care.  -Breo Ellipta daily -Incruse Ellipta daily -DuoNebs TID, every 2 hr as needed - Roflumilast daily   Hypokalemia No further work-up or treatment due to transition to comfort care.  GERD -Protonix  Vertebral compression fracture -Pain medicine as above  FEN/GI: regular PPx: None  Disposition: Plan for inpatient hospice at this time  Subjective:  Patient able to shake her head when asked if she is in pain.   Objective: Temp:  [98.6 F (37 C)-98.8 F (37.1 C)] 98.6 F  (37 C) (01/02 0400) Pulse Rate:  [98-106] 106 (01/02 0400) Resp:  [7-18] 18 (01/02 0400) BP: (112-114)/(77-82) 112/77 (01/02 0400) SpO2:  [94 %-100 %] 94 % (01/02 0400)  Physical Exam: GEN: alert, resting in bed RESP: moving air bilaterally, 4L [place], non-labored breathing, normal rate NEURO: alert, responsive to questions SKIN: warm and dry    Laboratory: Recent Labs  Lab 09/17/2020 1345 09/20/20 0620  WBC 12.1*  --   HGB 11.1* 11.6*  HCT 35.0* 34.0*  PLT 332  --    Recent Labs  Lab 09/20/2020 1345 09/20/20 0620  NA 139 138  K 3.8 3.3*  CL 99  --   CO2 29  --   BUN <5*  --   CREATININE 0.72  --   CALCIUM 9.0  --   GLUCOSE 150*  --     Imaging/Diagnostic Tests: No results found.   Lyndee Hensen, DO  09/24/2020, 8:01 AM PGY-2, [place] Intern pager: [phone], text pages welcome

## 2020-09-24 NOTE — Plan of Care (Signed)
  Problem: Education: Goal: Knowledge of General Education information will improve Description: Including pain rating scale, medication(s)/side effects and non-pharmacologic comfort measures Outcome: Progressing   Problem: Health Behavior/Discharge Planning: Goal: Ability to manage health-related needs will improve Outcome: Progressing   Problem: Clinical Measurements: Goal: Ability to maintain clinical measurements within normal limits will improve Outcome: Progressing Goal: Will remain free from infection Outcome: Progressing Goal: Diagnostic test results will improve Outcome: Progressing Goal: Respiratory complications will improve Outcome: Progressing Goal: Cardiovascular complication will be avoided Outcome: Progressing   Problem: Activity: Goal: Risk for activity intolerance will decrease Outcome: Progressing   Problem: Nutrition: Goal: Adequate nutrition will be maintained Outcome: Progressing   Problem: Coping: Goal: Level of anxiety will decrease Outcome: Progressing   Problem: Safety: Goal: Ability to remain free from injury will improve Outcome: Progressing   Problem: Education: Goal: Knowledge of the prescribed therapeutic regimen will improve Outcome: Progressing   Problem: Coping: Goal: Ability to identify and develop effective coping behavior will improve Outcome: Progressing   Problem: Clinical Measurements: Goal: Quality of life will improve Outcome: Progressing   Problem: Respiratory: Goal: Verbalizations of increased ease of respirations will increase Outcome: Progressing   Problem: Role Relationship: Goal: Family's ability to cope with current situation will improve Outcome: Progressing Goal: Ability to verbalize concerns, feelings, and thoughts to partner or family member will improve Outcome: Progressing   Problem: Pain Management: Goal: Satisfaction with pain management regimen will improve Outcome: Progressing

## 2020-09-25 ENCOUNTER — Ambulatory Visit: Payer: Medicare Other

## 2020-09-25 DIAGNOSIS — J9621 Acute and chronic respiratory failure with hypoxia: Secondary | ICD-10-CM | POA: Diagnosis not present

## 2020-09-25 DIAGNOSIS — J441 Chronic obstructive pulmonary disease with (acute) exacerbation: Secondary | ICD-10-CM | POA: Diagnosis not present

## 2020-09-25 DIAGNOSIS — J9611 Chronic respiratory failure with hypoxia: Secondary | ICD-10-CM | POA: Diagnosis not present

## 2020-09-25 DIAGNOSIS — Z515 Encounter for palliative care: Secondary | ICD-10-CM | POA: Diagnosis not present

## 2020-09-25 MED ORDER — SENNA 8.6 MG PO TABS
2.0000 | ORAL_TABLET | Freq: Every day | ORAL | Status: DC
  Administered 2020-09-25: 17.2 mg via ORAL
  Filled 2020-09-25: qty 2

## 2020-09-25 MED ORDER — POLYETHYLENE GLYCOL 3350 17 G PO PACK
17.0000 g | PACK | Freq: Every day | ORAL | Status: DC
  Administered 2020-09-25: 17 g via ORAL
  Filled 2020-09-25: qty 1

## 2020-09-25 NOTE — TOC Initial Note (Signed)
Transition of Care [facility]) - Initial/Assessment Note    Patient Details  Name: Mary Jenkins MRN: [id number] Date of Birth: [birth year]  Transition of Care [facility]) CM/SW Contact:    Marilu Favre, RN Phone Number: 09/25/2020, 2:26 PM  Clinical Narrative:                 Went to room to speak to patient and daughter [name] . Patient's sister at bedside , stated [name] went to car to get something and will return shortly.   When NCM returned to room [name] had been to room but had to leave. Called [name] [phone], [name] stated she had talked to palliative team and her mother is not stable for transfer.   NCM secure chatted Dr Manus Rudd and Almyra Free with palliative.   Expected Discharge Plan: [place]     Patient Goals and CMS Choice        Expected Discharge Plan and Services Expected Discharge Plan: [place]       Living arrangements for the past 2 months: Single Family Home                   DME Agency: NA                  Prior Living Arrangements/Services Living arrangements for the past 2 months: Single Family Home                     Activities of Daily Living      Permission Sought/Granted                  Emotional Assessment              Admission diagnosis:  COPD exacerbation ([place]) [J44.1] Acute on chronic respiratory failure with hypoxia ([place]) [J96.21] COPD with acute exacerbation ([place]) [J44.1] Patient Active Problem List   Diagnosis Date Noted  . COPD with acute exacerbation ([place]) 09/21/2020  . Protein-calorie malnutrition, severe 09/11/2020  . Acute respiratory failure with hypoxia ([place])   . SOB (shortness of breath) 09/09/2020  . Compression fracture of body of thoracic vertebra ([place]) 08/12/2020  . Back pain 08/12/2020  . Pain   . Chronic obstructive pulmonary disease ([place])   . Palliative care by specialist   . Goals of care, counseling/discussion   . Dysphagia  04/23/2020  . Acute respiratory distress 04/22/2020  . Chills (without fever) 02/23/2020  . Shortness of breath 12/27/2019  . Screening for hyperlipidemia 12/03/2019  . Bruising 07/21/2019  . COPD exacerbation ([place]) 01/11/2019  . Healthcare maintenance 05/23/2018  . Chronic respiratory failure with hypoxia and hypercapnia ([place]) 04/13/2018  . Weight loss 08/02/2017  . Severe episode of recurrent major depressive disorder, without psychotic features ([place]) 04/25/2017  . Encounter for HCV screening test for low risk patient 03/10/2017  . History of hyperkalemia 02/13/2016  . Skin lesion of back 11/15/2014  . Atypical chest pain 10/21/2014  . Cataract 10/21/2014  . Dyslipidemia, goal to be determined 04/22/2014  . Lipoma 10/07/2012  . Cervical spine arthritis 08/27/2012  . History of breast cancer 07/23/2012  . GERD (gastroesophageal reflux disease) 02/03/2012  . Anemia 07/29/2011  . COPD GOLD IV 06/24/2011  . Liver cyst 06/24/2011  . Insomnia 06/24/2011   PCP:  Matilde Haymaker, MD Pharmacy:   [facility] ([place]), [place] - [address] [place] [address] Phone: [phone] Fax: [phone]  Social Determinants of Health (SDOH) Interventions    Readmission Risk Interventions Readmission Risk Prevention Plan 09/15/2020  Transportation Screening Complete  PCP or Specialist Appt within 3-5 Days Complete  HRI or [place] Complete  Social Work Consult for [place] Planning/Counseling Complete  Palliative Care Screening Not Applicable  Medication Review [employer]) Complete  Some recent data might be hidden

## 2020-09-25 NOTE — Progress Notes (Signed)
Family Medicine Teaching Service Daily Progress Note Intern Pager: [phone]  Patient name: Mary Jenkins Medical record number: [id number] Date of birth: [birth year] Age: 65 y.o. Gender: female  Primary Care Provider: Matilde Haymaker, MD Consultants: Palliative Code Status: DNR, Comfort Care  Pt Overview and Major Events to Date:  12/30 - Admitted for COPD exacerbation 12/30 - transitioned to comfort care  Assessment and Plan: [name] is a 65 year old woman who was admitted to the hospital for COPD exacerbation.  Since admission, she has decided to transition to comfort care.  Her previous medical history is significant for chronic hypoxic respiratory failure, breast cancer, depression, anxiety.  Transition to comfort care Ms. Christiansen had an extended conversation with palliative care which involved the palliative care team and the family of Ms. Wenzlick.  It was decided to transition to comfort care.  Comfort measures have been placed and we will avoid further investigations for reversible problems and focus are energy on comfort measures.  -Continue Morphine drip for air hunger/anxiety -ContinueCompazine for nausea -Ativan q4hr PRN -Glycopyrrolate - Start Miralax daily for constipation - Plan to transfer to outpatient   COPD exacerbation Breathing comfortably on [place] and having apneic spells. This is likely in part due to her anxiolytics and morphine none that she has been transition to comfort care. COPD exacerbation treatments discontinued when pt transitioned to hospice care.  -Breo Ellipta daily -Incruse Ellipta daily -DuoNebs TID, every 2 hr as needed - Roflumilast daily   Addendum:  Seen by Palliative care later in day.  Noted to have RR of 4.  Did not feel patient could handle transport out of hospital so will planning on keeping here.  GERD -Protonix  Vertebral compression fracture -Pain medicine as above  FEN/GI: regular PPx: None  Status is:  Inpatient  Remains inpatient appropriate because:Inpatient level of care appropriate due to severity of illness   Dispo: The patient is from: Home              Anticipated d/c is to: Hospice              Anticipated d/c date is: > 3 days              Patient currently is not medically stable to d/c.    Subjective:  Spoke with patient and patient's daughter this morning.  Discussed possibility of transfer to [facility] and they were ammenable to this plan.  Objective: Temp:  [98.4 F (36.9 C)] 98.4 F (36.9 C) (01/03 0353) Pulse Rate:  [100] 100 (01/03 0353) Resp:  [18] 18 (01/03 0353) BP: (114)/(73) 114/73 (01/03 0353) SpO2:  [96 %-97 %] 96 % (01/03 0729) Physical Exam:  Physical Exam Constitutional:      General: She is not in acute distress. HENT:     Head: Normocephalic and atraumatic.     Mouth/Throat:     Mouth: Mucous membranes are moist.  Cardiovascular:     Rate and Rhythm: Normal rate and regular rhythm.  Pulmonary:     Breath sounds: Decreased breath sounds present.     Comments: Lung exam limited due to patient being unable to take deep breaths due to cough. Abdominal:     General: Bowel sounds are normal.     Palpations: Abdomen is soft.  Skin:    General: Skin is warm.  Neurological:     Mental Status: She is alert and oriented to person, place, and time.     Laboratory: Recent Labs  Lab 09/16/2020  1345 09/20/20 0620  WBC 12.1*  --   HGB 11.1* 11.6*  HCT 35.0* 34.0*  PLT 332  --    Recent Labs  Lab 09/09/2020 1345 09/20/20 0620  NA 139 138  K 3.8 3.3*  CL 99  --   CO2 29  --   BUN <5*  --   CREATININE 0.72  --   CALCIUM 9.0  --   GLUCOSE 150*  --      Imaging/Diagnostic Tests:  No new imaging  Delora Fuel, MD 09/25/2020, 3:06 PM PGY-1, [place] Intern pager: [phone], text pages welcome

## 2020-09-25 NOTE — Plan of Care (Signed)
Patient remains on IV morphine. Prn Ativan administered twice for anxiety. Daughter at bedside and supportive. Problem: Education: Goal: Knowledge of General Education information will improve Description: Including pain rating scale, medication(s)/side effects and non-pharmacologic comfort measures Outcome: Progressing   Problem: Health Behavior/Discharge Planning: Goal: Ability to manage health-related needs will improve Outcome: Progressing   Problem: Clinical Measurements: Goal: Ability to maintain clinical measurements within normal limits will improve Outcome: Progressing Goal: Will remain free from infection Outcome: Progressing Goal: Diagnostic test results will improve Outcome: Progressing Goal: Respiratory complications will improve Outcome: Progressing Goal: Cardiovascular complication will be avoided Outcome: Progressing   Problem: Activity: Goal: Risk for activity intolerance will decrease Outcome: Progressing   Problem: Nutrition: Goal: Adequate nutrition will be maintained Outcome: Progressing   Problem: Coping: Goal: Level of anxiety will decrease Outcome: Progressing   Problem: Safety: Goal: Ability to remain free from injury will improve Outcome: Progressing   Problem: Education: Goal: Knowledge of the prescribed therapeutic regimen will improve Outcome: Progressing   Problem: Coping: Goal: Ability to identify and develop effective coping behavior will improve Outcome: Progressing   Problem: Clinical Measurements: Goal: Quality of life will improve Outcome: Progressing   Problem: Respiratory: Goal: Verbalizations of increased ease of respirations will increase Outcome: Progressing   Problem: Role Relationship: Goal: Family's ability to cope with current situation will improve Outcome: Progressing Goal: Ability to verbalize concerns, feelings, and thoughts to partner or family member will improve Outcome: Progressing   Problem: Pain  Management: Goal: Satisfaction with pain management regimen will improve Outcome: Progressing

## 2020-09-25 NOTE — Progress Notes (Signed)
Daily Progress Note   Patient Name: Mary Jenkins       Date: 09/25/2020 DOB: April 25, 1956  Age: 65 y.o. MRN#: [id number] Attending Physician: Dickie La, MD Primary Care Physician: Matilde Haymaker, MD Admit Date: 08/31/2020  Reason for Follow-up: Symptom check, end of life care  Subjective: Patient appears comfortable. Remains on morphine infusion at 3 mg/hr. Daughter Mary Jenkins is at bedside. She reports patient has not had anything to eat or drink today.    Patient appears to be having long periods of apnea - Mary Jenkins reports she has noticed that as well.  I measure patient's respiratory rate (using the stopwatch on my phone) as 4 breaths per minute.   Mary Jenkins and I discuss the possibility to transfer to [facility]. The main reason family desired transfer is because [employer] allows children to visit, and patient wanted to see her 44 year old grandson. We discussed that with that low respiratory rate, patient is at higher risk of dying during transport. Mary Jenkins expresses that she is not willing to take that risk.  Length of Stay: 4  Current Medications: Scheduled Meds:  . albuterol  2.5 mg Nebulization TID  . Chlorhexidine Gluconate Cloth  6 each Topical Daily  . cycloSPORINE  1 drop Both Eyes BID  . fluticasone furoate-vilanterol  1 puff Inhalation Daily  . polyethylene glycol  17 g Oral Daily  . roflumilast  500 mcg Oral Daily  . senna  2 tablet Oral Daily  . sodium chloride flush  3 mL Intravenous Q12H  . umeclidinium bromide  1 puff Inhalation Daily    Continuous Infusions: . sodium chloride    . morphine 4 mg/hr (09/25/20 0941)    PRN Meds: sodium chloride, acetaminophen **OR** acetaminophen, albuterol, antiseptic oral rinse, diphenhydrAMINE, glycopyrrolate **OR**  glycopyrrolate **OR** glycopyrrolate, hydrocortisone cream, LORazepam, morphine, polyvinyl alcohol, prochlorperazine, prochlorperazine, sodium chloride flush  Physical Exam Vitals reviewed.  Constitutional:      General: She is not in acute distress.    Appearance: She is ill-appearing.  Pulmonary:     Comments: Decreased respiratory rate with long periods of apnea Neurological:     Comments: Does not awaken to voice during my assessment             Vital Signs: BP 114/73 (BP Location: Right Arm)  Pulse 100   Temp 98.4 F (36.9 C) (Oral)   Resp 18   Ht _0  (1.676 m)   Wt 46.3 kg   SpO2 96%   BMI 16.46 kg/m  SpO2: SpO2: 96 % O2 Device: O2 Device: Nasal Cannula O2 Flow Rate: O2 Flow Rate (L/min): 4 L/min  Intake/output summary:   Intake/Output Summary (Last 24 hours) at 09/25/2020 1443 Last data filed at 09/25/2020 2992 Gross per 24 hour  Intake 129.17 ml  Output 400 ml  Net -270.83 ml   LBM: Last BM Date: 09/11/20 Baseline Weight: Weight: 46.3 kg Most recent weight: Weight: 46.3 kg       Palliative Assessment/Data: PPS 10%       Palliative Care Assessment & Plan   Patient Profile: 65 y.o.femalewith past medical history of advanced COPD (GOLD IV), breast cancer (1997, s/p right mastectomy), anemia, and vertebral compression fracture secondary to osetopenia presenting to the emergency departmenton 12/28/2021withshortness of breath and chest tightness.She was recently hospitalized 12/18--12/24 with COPD exacerbation.  ED Course:Dyspneic on exam, normal chest x-ray, no hypercarbia noted on ABG, negative D-dimer. Admitted to IM teaching service for acute on chronic hypoxemic respiratory failure likely due to worsening/exacerbation of COPD. Palliative Medicine was consulted per patient request.   Assessment: COPD exacerbation Hypokalemia Vertebral compression fracture End of Life care  Recommendations/Plan:  Continue full comfort measures  DNR/DNI as  previously documented  Patient is not stable enough for transfer to [place] morphine infusion - patient appears comfortable  Provided frequent assessments and administer PRN medications as clinically indicated to ensure comfort at EOL   Patient will likely need robinul as EOL becomes more imminent  PMT will continue to follow  Goals of Care and Additional Recommendations:  Limitations on Scope of Treatment: Full Comfort Care  Code Status: DNR/DNI  Prognosis:   Hours - Days  Discharge Planning:  Anticipated Hospital Death  Care plan was discussed with Dr. Manus Rudd and [facility]  Thank you for allowing the Palliative Medicine Team to assist in the care of this patient.   Total Time 25 minutes Prolonged Time Billed  no       Greater than 50%  of this time was spent counseling and coordinating care related to the above assessment and plan.  Lavena Bullion, NP  Please contact Palliative Medicine Team phone at [phone] for questions and concerns.

## 2020-09-26 DIAGNOSIS — J9621 Acute and chronic respiratory failure with hypoxia: Secondary | ICD-10-CM | POA: Diagnosis not present

## 2020-09-26 DIAGNOSIS — J441 Chronic obstructive pulmonary disease with (acute) exacerbation: Secondary | ICD-10-CM | POA: Diagnosis not present

## 2020-09-26 DIAGNOSIS — Z515 Encounter for palliative care: Secondary | ICD-10-CM | POA: Diagnosis not present

## 2020-09-26 MED ORDER — ALBUTEROL SULFATE (2.5 MG/3ML) 0.083% IN NEBU
2.5000 mg | INHALATION_SOLUTION | Freq: Two times a day (BID) | RESPIRATORY_TRACT | Status: DC
  Administered 2020-09-26: 2.5 mg via RESPIRATORY_TRACT
  Filled 2020-09-26: qty 3

## 2020-09-26 MED ORDER — ALBUTEROL SULFATE (2.5 MG/3ML) 0.083% IN NEBU: 2.5000 mg | INHALATION_SOLUTION | RESPIRATORY_TRACT | Status: DC | PRN

## 2020-09-26 NOTE — Care Management Important Message (Signed)
Important Message  Patient Details  Name: Mary Jenkins MRN: [id number] Date of Birth: 07/02/1956   Medicare Important Message Given:  No  Patient at end of life out of respect no IM given will mail to patient home address.    Tijuana Scheidegger 09/26/2020, 1:15 PM

## 2020-09-26 NOTE — Progress Notes (Signed)
This RN called in by pts daughter, [name], due to pt "moaning and groaning in pain and anxious." Pt did seem uncomfortable and fidgety. PRN given and morphine increased to 63m/hr. Will continue to monitor pt and reassess at a later time.

## 2020-09-26 NOTE — Progress Notes (Signed)
Family Medicine Teaching Service Daily Progress Note Intern Pager: [phone]  Patient name: Mary Jenkins Medical record number: [id number] Date of birth: 08/13/1956 Age: 65 y.o. Gender: female  Primary Care Provider: Matilde Haymaker, MD Consultants: None Code Status: DNR, Comfort care  Pt Overview and Major Events to Date:  12/30 - Admitted for COPD exacerbation 12/30 - transitioned to comfort care  Assessment and Plan: Mary Jenkins is a 65 year old woman who was admitted to the hospital for COPD exacerbation. Since admission, she has decided to transition to comfort care. Her previous medical history is significant for chronic hypoxic respiratory failure, breast cancer, depression, anxiety.  Transition to comfort care Mary Jenkins had an extended conversation with palliative care which involved the palliative care team and the family of Mary Jenkins. It was decided to transition to comfort care. Yesterday seen by Palliative care who did not believe Mary Jenkins could be safely transported to a Hospice facility.  Daughter in agreement -Palliative and ToC following, appreciate recs -Continue Morphine drip for air hunger/anxiety -Continue Compazine for nausea -Ativan q4hr PRN -Glycopyrrolate - Miralax daily for constipation   FEN/GI: Regular PPx: None   Status is: Inpatient  Remains inpatient appropriate because:Inpatient level of care appropriate due to severity of illness   Dispo: The patient is from: Home              Anticipated d/c is to: Hospice              Anticipated d/c date is: > 3 days              Patient currently is not medically stable to d/c.    Subjective:  NAOE  Objective: Temp:  [97.5 F (36.4 C)] 97.5 F (36.4 C) (01/04 0444) Pulse Rate:  [108] 108 (01/04 0444) Resp:  [9] 9 (01/04 0444) BP: (149)/(77) 149/77 (01/04 0444) SpO2:  [92 %-97 %] 92 % (01/04 0845) Physical Exam:  Physical Exam Constitutional:      Comments: Patient resting  comfortably on exam, did not disturb  HENT:     Head: Normocephalic and atraumatic.  Cardiovascular:     Rate and Rhythm: Normal rate and regular rhythm.  Pulmonary:     Breath sounds: Decreased breath sounds present.  Skin:    General: Skin is warm.     Laboratory: Recent Labs  Lab 09/13/2020 1345 09/20/20 0620  WBC 12.1*  --   HGB 11.1* 11.6*  HCT 35.0* 34.0*  PLT 332  --    Recent Labs  Lab 09/03/2020 1345 09/20/20 0620  NA 139 138  K 3.8 3.3*  CL 99  --   CO2 29  --   BUN <5*  --   CREATININE 0.72  --   CALCIUM 9.0  --   GLUCOSE 150*  --      Imaging/Diagnostic Tests: No new imaging  Mary Fuel, MD 09/26/2020, 12:56 PM PGY-1, [place] Intern pager: [phone], text pages welcome

## 2020-09-27 DIAGNOSIS — Z515 Encounter for palliative care: Secondary | ICD-10-CM | POA: Diagnosis not present

## 2020-09-27 DIAGNOSIS — R0602 Shortness of breath: Secondary | ICD-10-CM

## 2020-09-27 DIAGNOSIS — Z7189 Other specified counseling: Secondary | ICD-10-CM | POA: Diagnosis not present

## 2020-09-27 DIAGNOSIS — J441 Chronic obstructive pulmonary disease with (acute) exacerbation: Secondary | ICD-10-CM | POA: Diagnosis not present

## 2020-09-27 DIAGNOSIS — J9621 Acute and chronic respiratory failure with hypoxia: Secondary | ICD-10-CM | POA: Diagnosis not present

## 2020-09-27 MED ORDER — LORAZEPAM 2 MG/ML IJ SOLN
1.0000 mg | INTRAMUSCULAR | Status: DC | PRN
  Administered 2020-09-27 – 2020-09-29 (×6): 1 mg via INTRAVENOUS
  Filled 2020-09-27 (×6): qty 1

## 2020-09-27 MED ORDER — POLYETHYLENE GLYCOL 3350 17 G PO PACK: 17.0000 g | PACK | Freq: Every day | ORAL | Status: DC | PRN

## 2020-09-27 NOTE — Progress Notes (Signed)
Nutrition Brief Note  Chart reviewed. Pt now transitioning to comfort care.  No further nutrition interventions warranted at this time.  Please re-consult as needed.   Loistine Chance, RD, LDN, [place] Registered Dietitian II Certified Diabetes Care and Education Specialist Please refer to [facility] for RD and/or RD on-call/weekend/after hours pager

## 2020-09-27 NOTE — Progress Notes (Signed)
Daily Progress Note   Patient Name: Mary Jenkins       Date: 09/27/2020 DOB: Oct 24, 1955  Age: 65 y.o. MRN#: [id number] Attending Physician: Dickie La, MD Primary Care Physician: Matilde Haymaker, MD Admit Date: 08/29/2020  Reason for Consultation/Follow-up: Non pain symptom management, Pain control, Psychosocial/spiritual support and Terminal Care  Subjective: Chart review performed. Received report from primary RN - no acute concerns. RN has been increasing morphine PRN for EOL comfort - patient is now at 30m/hr. RN reports patient has no oral intake.   Went to visit patient at bedside - daughter/[name] and Haddly were present. Patient was lying in bed - she did not wake or respond to voice/gentle touch. No signs or non-verbal gestures of pain or discomfort noted. No respiratory distress, increased work of breathing, or secretions noted. Patient did, however, have very shallow respirations with apneic episodes up to 20 seconds - RR was 8. She was on 3L [place].  Emotional support provided to family at bedside. Natural trajectory at EOL was reviewed. Gently discussed weaning oxygen and managing shortness of breath/symptoms that may arise with medications. Daughter was agreeable to wean O2 while I was present - O2 adjusted to 2L and morphine bolus provided to cover any symptoms during weaning process. Therapeutic listening provided to family as they were given space to express their thoughts and feelings regarding the patient's current medical situation.   All questions and concerns addressed. Encouraged to call with questions and/or concerns. PMT card provided.  Discussed symptom management plan with RN: wean O2 and provide opioid and/or benzo to manage shortness of breath, increased work of  breathing, respiratory rate >22.   Length of Stay: 6  Current Medications: Scheduled Meds:  . Chlorhexidine Gluconate Cloth  6 each Topical Daily  . cycloSPORINE  1 drop Both Eyes BID  . fluticasone furoate-vilanterol  1 puff Inhalation Daily  . polyethylene glycol  17 g Oral Daily  . roflumilast  500 mcg Oral Daily  . senna  2 tablet Oral Daily  . sodium chloride flush  3 mL Intravenous Q12H  . umeclidinium bromide  1 puff Inhalation Daily    Continuous Infusions: . sodium chloride    . morphine 6 mg/hr (09/27/20 0616)    PRN Meds: sodium chloride, acetaminophen **OR** acetaminophen, albuterol, antiseptic oral rinse, diphenhydrAMINE,  glycopyrrolate **OR** glycopyrrolate **OR** glycopyrrolate, hydrocortisone cream, LORazepam, morphine, polyvinyl alcohol, prochlorperazine, prochlorperazine, sodium chloride flush  Physical Exam Vitals and nursing note reviewed.  Constitutional:      General: She is not in acute distress.    Appearance: She is ill-appearing.     Comments: Frail appearing  Pulmonary:     Effort: No respiratory distress.     Comments: Shallow respirations  Skin:    General: Skin is cool and dry.  Neurological:     Mental Status: She is unresponsive.     Motor: Weakness present.             Vital Signs: BP (!) 149/77 (BP Location: Left Arm)   Pulse (!) 108   Temp (!) 97.5 F (36.4 C)   Resp (!) 9   Ht _0  (1.676 m)   Wt 46.3 kg   SpO2 92%   BMI 16.46 kg/m  SpO2: SpO2: 92 % O2 Device: O2 Device: Nasal Cannula O2 Flow Rate: O2 Flow Rate (L/min): 3 L/min  Intake/output summary: No intake or output data in the 24 hours ending 09/27/20 1032 LBM: Last BM Date: 09/11/20 Baseline Weight: Weight: 46.3 kg Most recent weight: Weight: 46.3 kg       Palliative Assessment/Data: PPS 10%    Flowsheet Rows   Flowsheet Row Most Recent Value  Intake Tab   Referral Department --  [fmts]  Unit at Time of Referral ER  Palliative Care Primary Diagnosis  Pulmonary  Date Notified 09/20/20  Palliative Care Type Return patient Palliative Care  Reason for referral Clarify Goals of Care  Date of Admission 09/11/2020  Date first seen by Palliative Care 09/20/20  # of days Palliative referral response time 0 Day(s)  # of days IP prior to Palliative referral 1  Clinical Assessment   Psychosocial & Spiritual Assessment   Palliative Care Outcomes       Patient Active Problem List   Diagnosis Date Noted  . COPD with acute exacerbation ([place]) 09/21/2020  . Protein-calorie malnutrition, severe 09/11/2020  . Acute respiratory failure with hypoxia ([place])   . SOB (shortness of breath) 09/09/2020  . Compression fracture of body of thoracic vertebra ([place]) 08/12/2020  . Back pain 08/12/2020  . Pain   . Chronic obstructive pulmonary disease ([place])   . Palliative care by specialist   . End of life care   . Dysphagia 04/23/2020  . Acute respiratory distress 04/22/2020  . Chills (without fever) 02/23/2020  . Shortness of breath 12/27/2019  . Screening for hyperlipidemia 12/03/2019  . Bruising 07/21/2019  . COPD exacerbation ([place]) 01/11/2019  . Healthcare maintenance 05/23/2018  . Acute on chronic respiratory failure with hypoxia ([place]) 04/13/2018  . Weight loss 08/02/2017  . Severe episode of recurrent major depressive disorder, without psychotic features ([place]) 04/25/2017  . Encounter for HCV screening test for low risk patient 03/10/2017  . History of hyperkalemia 02/13/2016  . Skin lesion of back 11/15/2014  . Atypical chest pain 10/21/2014  . Cataract 10/21/2014  . Dyslipidemia, goal to be determined 04/22/2014  . Lipoma 10/07/2012  . Cervical spine arthritis 08/27/2012  . History of breast cancer 07/23/2012  . GERD (gastroesophageal reflux disease) 02/03/2012  . Anemia 07/29/2011  . COPD GOLD IV 06/24/2011  . Liver cyst 06/24/2011  . Insomnia 06/24/2011    Palliative Care Assessment & Plan   Patient Profile: 65 y.o.femalewith past  medical history of advanced COPD (GOLD IV), breast cancer (1997, s/p right mastectomy), anemia, and vertebral  compression fracture secondary to osetopenia presenting to the emergency departmenton 12/28/2021withshortness of breath and chest tightness.She was recently hospitalized 12/18--12/24 with COPD exacerbation.  ED Course:Dyspneic on exam, normal chest x-ray, no hypercarbia noted on ABG, negative D-dimer. Admitted to IM teaching service for acute on chronic hypoxemic respiratory failure likely due to worsening/exacerbation of COPD. Palliative Medicine was consulted per patient request.  Assessment: COPD exacerbation Hypokalemia Vertebral compression fracture End of Life care  Recommendations/Plan:  Continue full comfort measures - likely has hours to days, anticipate hospital death  Continue DNR/DNI as previously documented  Wean oxygen to off for EOL. Do not escalate/titrate. Give benzos and/or opioids for shortness of breath, dyspnea, increased work of breathing or respiratory rate over 22  Discontinued scheduled daily inhalers - manage symptoms per above  Continue current EOL medication regimen, patient appears comfortable  Provide frequent assessments and administer PRN medications as clinically necessary to ensure EOL comfort PMT will continue to follow holistically   Goals of Care and Additional Recommendations:  Limitations on Scope of Treatment: Full Comfort Care  Code Status:    Code Status Orders  (From admission, onward)         Start     Ordered   09/20/20 1919  Do not attempt resuscitation (DNR)  Continuous       Question Answer Comment  In the event of cardiac or respiratory ARREST Do not call a "code blue"   In the event of cardiac or respiratory ARREST Do not perform Intubation, CPR, defibrillation or ACLS   In the event of cardiac or respiratory ARREST Use medication by any route, position, wound care, and other measures to relive pain and suffering.  May use oxygen, suction and manual treatment of airway obstruction as needed for comfort.      09/20/20 1919        Code Status History    Date Active Date Inactive Code Status Order ID Comments User Context   09/20/2020 1435 09/20/2020 1919 Full Code [id number]  Wilber Oliphant, MD ED   09/09/2020 2218 09/15/2020 1953 Full Code [id number]  Richarda Osmond, DO ED   09/09/2020 2147 09/09/2020 2218 Full Code [id number]  Richarda Osmond, DO ED   08/12/2020 1604 08/16/2020 1854 Full Code [id number]  Armando Reichert, MD ED   04/22/2020 0004 04/25/2020 2131 Full Code [id number]  Ezequiel Essex, MD ED   12/27/2019 2110 12/28/2019 1914 Full Code [id number]  Carollee Leitz, MD ED   01/11/2019 1332 01/12/2019 1435 Full Code [id number]  Jean Rosenthal, MD ED   02/01/2018 1454 02/02/2018 1954 DNR [id number]  Lorella Nimrod, MD ED   03/12/2013 1852 03/13/2013 1748 Full Code [id number]  Trish Fountain, MD Inpatient   Advance Care Planning Activity       Prognosis:   Hours - Days  Discharge Planning:  Anticipated Hospital Death  Care plan was discussed with primary RN, daughter/[name], family member/Haddley  Thank you for allowing the Palliative Medicine Team to assist in the care of this patient.   Total Time 35 minutes Prolonged Time Billed  no       Greater than 50%  of this time was spent counseling and coordinating care related to the above assessment and plan.  Lin Landsman, NP  Please contact Palliative Medicine Team phone at [phone] for questions and concerns.

## 2020-09-27 NOTE — Progress Notes (Signed)
Family Medicine Teaching Service Daily Progress Note Intern Pager: [phone]  Patient name: Mary Jenkins Medical record number: [id number] Date of birth: Dec 16, 1955 Age: 65 y.o. Gender: female  Primary Care Provider: Matilde Haymaker, MD Consultants: None Code Status: DNR,   Pt Overview and Major Events to Date:  12/30 - Admitted for COPD exacerbation 12/30 - transitioned to comfort care  Assessment and Plan: Mary Jenkins is a 65 year old woman who was admitted to the hospital for COPD exacerbation. Since admission, she has decided to transition to comfort care. Her previous medical history is significant for chronic hypoxic respiratory failure, breast cancer, depression, anxiety.   Transition to comfort care Ms. Leisinger had an extended conversation with palliative care which involved the palliative care team and the family of Ms. Haseley. It was decided to transition to comfort care. Palliative care does not believe Ms Teodoro could be safely transported to a Hospice facility.  Daughter in agreement.  Morphine drip increased to 38m/hour.   -Palliative and ToC following, appreciate recs -ContinueMorphinedripfor air hunger/anxiety -Ativanq4hr PRN -Glycopyrrolate - D/C scheduled inhalers - Wean off O2  FEN/GI: Regular PPx: None   Status is: Inpatient  Remains inpatient appropriate because:Ongoing active pain requiring inpatient pain management   Dispo: The patient is from: Home              Patient currently is not medically stable to d/c.    Subjective:  NAOE  Objective: Temp:  [97.8 F (36.6 C)] 97.8 F (36.6 C) (01/05 1310) Pulse Rate:  [108] 108 (01/05 1310) Resp:  [19] 19 (01/05 1310) BP: (123)/(81) 123/81 (01/05 1310) SpO2:  [100 %] 100 % (01/05 1310) Physical Exam:  Constitutional:      Comments: Patient resting comfortably on exam, did not disturb  HENT:     Head: Normocephalic and atraumatic.  Cardiovascular:     Rate and Rhythm: Normal rate and  regular rhythm.  Pulmonary:     Breath sounds: Decreased breath sounds present.  Skin:    General: Skin is warm.   Laboratory: No results for input(s): WBC, HGB, HCT, PLT in the last 168 hours. No results for input(s): NA, K, CL, CO2, BUN, CREATININE, CALCIUM, PROT, BILITOT, ALKPHOS, ALT, AST, GLUCOSE in the last 168 hours.  Invalid input(s): LABALBU   Imaging/Diagnostic Tests:  No new imaging  MDelora Fuel MD 09/27/2020, 3:21 PM PGY-1, CMount CrawfordIntern pager: [phone] text pages welcome

## 2020-09-28 DIAGNOSIS — J9621 Acute and chronic respiratory failure with hypoxia: Secondary | ICD-10-CM | POA: Diagnosis not present

## 2020-09-28 NOTE — Progress Notes (Signed)
Palliative Medicine RN Note: Symptom check. Pt was resting comfortably with apnea when I arrived in the room but began to struggle to breathe with tachypnea after a few minutes. Her daughter reports that she responds well to Ativan, and family is coming to visit soon. RN Mendel Ryder will bring a dose. Plan for PMT to follow up tomorrow.  Marjie Skiff Tashica Provencio, RN, BSN, [facility] Palliative Medicine Team 09/28/2020 12:10 PM Office [phone]

## 2020-09-28 NOTE — Progress Notes (Signed)
Family Medicine Teaching Service Daily Progress Note Intern Pager: [phone]  Patient name: Mary Jenkins Medical record number: [id number] Date of birth: 06-03-1956 Age: 65 y.o. Gender: female  Primary Care Provider: Matilde Haymaker, MD Consultants: None Code Status: DNR  Pt Overview and Major Events to Date:  12/30 - Admitted for COPD exacerbation 12/30 - transitioned to comfort care  Assessment and Plan: Mary Jenkins is a 65 year old woman who was admitted to the hospital for COPD exacerbation. Since admission, she has decided to transition to comfort care. Her previous medical history is significant for chronic hypoxic respiratory failure, breast cancer, depression, anxiety.  Transition to comfort care Mary Jenkins had an extended conversation with palliative care which involved the palliative care team and the family of Mary Jenkins. It was decided to transition to comfort care. Palliative care does not believe Mary Jenkins could be safely transported to a Hospice facility. Daughter in agreement.  Morphine drip currently 65m/hour.  Mary Jenkins some agonal breathing on exam today.  Spoke with nurse about giving repeat Ativan dose    -Palliative and ToC following, appreciate recs -ContinueMorphinedripfor air hunger/anxiety -Ativanq4hr PRN -Glycopyrrolate - Wean off O2   FEN/GI:Regular PDJM:EQAS  Status is: Inpatient  Remains inpatient appropriate because:Ongoing active pain requiring inpatient pain management   Dispo: The patient is from: Home  Patient currently is not medically stable to d/c.     Subjective:  NAOE  Objective: Temp:  [97.8 F (36.6 C)-98.1 F (36.7 C)] 98.1 F (36.7 C) (01/06 0642) Pulse Rate:  [108-128] 128 (01/06 0642) Resp:  [15-19] 15 (01/06 0642) BP: (102-123)/(64-81) 102/64 (01/06 0642) SpO2:  [94 %-100 %] 94 % (01/06 03419 Physical Exam:  Constitutional:  Comments: Patient resting comfortably on exam HENT:   Head: Normocephalicand atraumatic.  Cardiovascular:  Rate and Rhythm: Normal rateand regular rhythm.  Pulmonary:  Breath sounds: Decreased breath soundspresent. Also has agonal breathing. Skin: General: Skin is warm.    MDelora Fuel MD 09/28/2020, 11:46 AM PGY-1, CMarbleheadIntern pager: [phone] text pages welcome

## 2020-09-29 DIAGNOSIS — Z515 Encounter for palliative care: Secondary | ICD-10-CM | POA: Diagnosis not present

## 2020-09-29 DIAGNOSIS — J9621 Acute and chronic respiratory failure with hypoxia: Secondary | ICD-10-CM | POA: Diagnosis not present

## 2020-09-29 DIAGNOSIS — Z66 Do not resuscitate: Secondary | ICD-10-CM | POA: Diagnosis not present

## 2020-09-29 DIAGNOSIS — J441 Chronic obstructive pulmonary disease with (acute) exacerbation: Secondary | ICD-10-CM | POA: Diagnosis not present

## 2020-10-04 ENCOUNTER — Telehealth: Payer: Self-pay | Admitting: Family Medicine

## 2020-10-04 NOTE — Telephone Encounter (Signed)
Death Certificate was dropped off by [facility] and has been placed in PCP's box for completion. Please use blue or black ink. Please return to [facility] once completed.

## 2020-10-06 NOTE — Telephone Encounter (Signed)
[facility] has been called to pick-up DC. [facility] Admin has been notified

## 2020-10-23 ENCOUNTER — Inpatient Hospital Stay: Payer: Medicare Other | Admitting: Internal Medicine

## 2020-10-24 NOTE — Death Summary Note (Addendum)
[facility] Death Summary  Patient name: Mary Jenkins Medical record number: [id number] Date of birth: [birth year] Age: 65 y.o. Gender: female Date of Admission: Oct 12, 2020  Date of Death: 10-22-20 Admitting Physician: Rise Patience, DO  Primary Care Provider: Matilde Haymaker, MD Consultants: Palliative Care  Indication for Hospitalization: COPD Exacerbation  Brief Hospital Course:  Mary Jenkins is a 65 year old woman who was admitted to the hospital for COPD exacerbation.  Since admission, she has decided to transition to comfort care.  Her previous medical history is significant for chronic hypoxic respiratory failure, breast cancer, depression, anxiety.   Acute on chronic respiratory distress  COPD GOLD IV Patient was recently admitted on 12/18 for COPD exacerbation that required BiPAP, and treated with antibiotics and prednisone taper. She was discharged on 12/24 with steroids and home medications.  In the ED, Dyspneic on exam, normal chest x-ray, no hypercarbia noted on ABG, negative D-dimer. Admitted to IM teaching service for acute on chronic hypoxemic respiratory failure likely due to worsening/exacerbation of COPD.  Started on 4.5 L O2. Palliative Medicine was consulted per patient request and patint requested to be transitioned to comfort care.  Patient initially continued on [place] and Incruse Elipta.  Was started on Morphine and Ativan for air hunger and Compazine for nausea.  Patient was evaluated by Palliative on 1/3 for possible D/C to [facility] hospice but did not believe she would survive trip.  Stopped giving breathing treatments and began to wean down O2.  Patient passed away on 22-Oct-2020 at 1755.         Delora Fuel, MD 10/12/2020, 2:30 AM PGY-1, [place]

## 2020-10-24 NOTE — Progress Notes (Signed)
Family Medicine Teaching Service Daily Progress Note Intern Pager: [phone]  Patient name: Mary Jenkins Medical record number: [id number] Date of birth: Feb 06, 1956 Age: 65 y.o. Gender: female  Primary Care Provider: Matilde Haymaker, MD Consultants: None Code Status: DNR  Pt Overview and Major Events to Date:  12/30 - Admitted for COPD exacerbation 12/30 - transitioned to comfort care  Assessment and Plan: Mary Jenkins is a 65 year old woman who was admitted to the hospital for COPD exacerbation. Since admission, she has decided to transition to comfort care. Her previous medical history is significant for chronic hypoxic respiratory failure, breast cancer, depression, anxiety.  Transition to comfort care Mary Jenkins had an extended conversation with palliative care which involved the palliative care team and the family of Mary Jenkins. It was decided to transition to comfort care. Palliative care does not believe Mary Jenkins could be safely transported to a Hospice facility. Daughter in agreement.  Morphine drip currently 67m/hour.  -Palliative and ToC following, appreciate recs -ContinueMorphinedripfor air hunger/anxiety -Ativanq4hr PRN -Glycopyrrolate -Continue comfort measures  FEN/GI:Regular [facility]:EYEM  Status is: Inpatient  Remains inpatient appropriate because:Ongoing active pain requiring inpatient pain management   Dispo: The patient is from: Home  Patient currently is not medically stable to d/c.   Subjective:  No acute distress, resting comfortably  Objective: Temp:  [98.6 F (37 C)] 98.6 F (37 C) (01/07 0528) Pulse Rate:  [126] 126 (01/07 0528) Resp:  [15] 15 (01/07 0528) BP: (109)/(67) 109/67 (01/07 0528) SpO2:  [93 %] 93 % (01/07 0528) Physical Exam: General: Patient resting comfortably on exam in no apparent distress Heart: S1, S2 present with no murmurs appreciated Lungs: Patient with some reduced breath sounds but no apparent  distress   WLurline Del DO 102-11-2020 7:02 AM PGY-2, CBayfieldIntern pager: [phone] text pages welcome

## 2020-10-24 NOTE — Progress Notes (Addendum)
Daily Progress Note   Patient Name: Mary Jenkins       Date: 10/06/20 DOB: 1956/01/21  Age: 65 y.o. MRN#: [id number] Attending Physician: Leeanne Rio, MD Primary Care Physician: Matilde Haymaker, MD Admit Date: 08/30/2020  Reason for Follow-up: End of life care, symptom management, psychosocial support  Subjective: Patient appears comfortable. Morphine infusion is at 8 mg/hr. Breathing is unlabored, but respirations are uneven at times.   Multiple family members present at bedside. We discuss that end of life is imminent at this point, likely within 24 hours. Provide education and counseling on expectations at EOL. Discussed that patient may require additional medication to ensure comfort in the last hours/minutes leading up to death.   Family expresses appreciation for PMT support.   Length of Stay: 8  Current Medications: Scheduled Meds:   cycloSPORINE  1 drop Both Eyes BID   sodium chloride flush  3 mL Intravenous Q12H    Continuous Infusions:  sodium chloride     morphine 8 mg/hr (2020/10/06 1503)    PRN Meds: sodium chloride, acetaminophen **OR** acetaminophen, albuterol, antiseptic oral rinse, diphenhydrAMINE, glycopyrrolate **OR** glycopyrrolate **OR** glycopyrrolate, hydrocortisone cream, LORazepam, morphine, polyethylene glycol, polyvinyl alcohol, prochlorperazine, prochlorperazine, sodium chloride flush         Vital Signs: BP 109/67 (BP Location: Right Arm)    Pulse (!) 126    Temp 98.6 F (37 C) (Axillary)    Resp 15    Ht _0  (1.676 m)    Wt 46.3 kg    SpO2 93%    BMI 16.46 kg/m  SpO2: SpO2: 93 % O2 Device: O2 Device: Nasal Cannula O2 Flow Rate: O2 Flow Rate (L/min): 1 L/min  Intake/output summary:   Intake/Output Summary (Last 24 hours) at  10-06-20 1629 Last data filed at 10-06-2020 0530 Gross per 24 hour  Intake 420.67 ml  Output 300 ml  Net 120.67 ml   LBM: Last BM Date: 09/11/20 Baseline Weight: Weight: 46.3 kg Most recent weight: Weight: 46.3 kg       Palliative Assessment/Data: PPS 10%       Palliative Care Assessment & Plan   Patient Profile: 65 y.o.femalewith past medical history of advanced COPD (GOLD IV), breast cancer (1997, s/p right mastectomy), anemia, and vertebral compression fracture secondary to osetopenia presenting to the emergency departmenton  12/28/2021withshortness of breath and chest tightness.She was recently hospitalized 12/18--12/24 with COPD exacerbation.  ED Course:Dyspneic on exam, normal chest x-ray, no hypercarbia noted on ABG, negative D-dimer. Admitted to IM teaching service for acute on chronic hypoxemic respiratory failure likely due to worsening/exacerbation of COPD. Palliative Medicine was consulted per patient request.  Assessment: COPD exacerbation Hypokalemia Vertebral compression fracture End of Life care  Recommendations/Plan:  Continue full comfort measures  DNR/DNI as previously documented  Patient is not stable enough for transfer to [place] morphine infusion - patient appears comfortable  Provided frequent assessments and administer PRN medications as clinically indicated to ensure comfort at EOL   PMT will continue to follow   Prognosis:   hours to days  Discharge Planning:  Anticipated hospital death   Thank you for allowing the Palliative Medicine Team to assist in the care of this patient.   Total Time 15 minutes Prolonged Time Billed  no       Greater than 50%  of this time was spent counseling and coordinating care related to the above assessment and plan.  Lavena Bullion, NP  Please contact Palliative Medicine Team phone at [phone] for questions and concerns.

## 2020-10-24 NOTE — Progress Notes (Signed)
Resident for family medicine notified of pt passing.

## 2020-10-24 NOTE — Progress Notes (Signed)
Pt passed away at 1755 with myself and sister at bedside.

## 2020-10-24 NOTE — Hospital Course (Signed)
Mary Jenkins is a 65 year old woman who was admitted to the hospital for COPD exacerbation.  Since admission, she has decided to transition to comfort care.  Her previous medical history is significant for chronic hypoxic respiratory failure, breast cancer, depression, anxiety.   Acute on chronic respiratory distress  COPD GOLD IV Patient was recently admitted on 12/18 for COPD exacerbation that required BiPAP, and treated with antibiotics and prednisone taper. She was discharged on 12/24 with steroids and home medications.  In the ED, Dyspneic on exam, normal chest x-ray, no hypercarbia noted on ABG, negative D-dimer. Admitted to IM teaching service for acute on chronic hypoxemic respiratory failure likely due to worsening/exacerbation of COPD.  Started on 4.5 L O2. Palliative Medicine was consulted per patient request and patint requested to be transitioned to comfort care.  Patient initially continued on [place] and Incruse Elipta.  Was started on Morphine and Ativan for air hunger and Compazine for nausea.  Patient was evaluated by Palliative on 1/3 for possible D/C to [facility] hosp[facility] but did not believe she would survive trip.  Stopped giving breathing treatments and began to wean down O2.  Patient passed away on October 08, 2020 at 1755.

## 2020-10-24 NOTE — Progress Notes (Signed)
Pt appears comfortable at present.

## 2020-10-24 NOTE — Progress Notes (Signed)
Eye prep complete.  Foley removed and IV and Morphine drip 120 cc wasted and witnessed by Lupe Carney, RN.

## 2020-10-24 DEATH — deceased

## 2020-12-18 ENCOUNTER — Ambulatory Visit: Payer: Medicare Other | Admitting: Internal Medicine

## 2021-04-05 IMAGING — CT CT ANGIO CHEST
2 of 7 series · 19 of 46 positions shown · IV contrast (APPLIED)
Comparison: 12/27/2019

CLINICAL DATA: Increased shortness of breath this morning

EXAM:
CT ANGIOGRAPHY CHEST WITH CONTRAST
TECHNIQUE: Multidetector CT imaging of the chest was performed using the
standard protocol during bolus administration of intravenous
contrast. Multiplanar CT image reconstructions and MIPs were
obtained to evaluate the vascular anatomy.
CONTRAST:  60mL OMNIPAQUE IOHEXOL 350 MG/ML SOLN

[Series 7: thins · axial · 0.56mm/px · z∈[-135,+146]mm · 16 of 453 slices shown]
[im 26/453  lung]
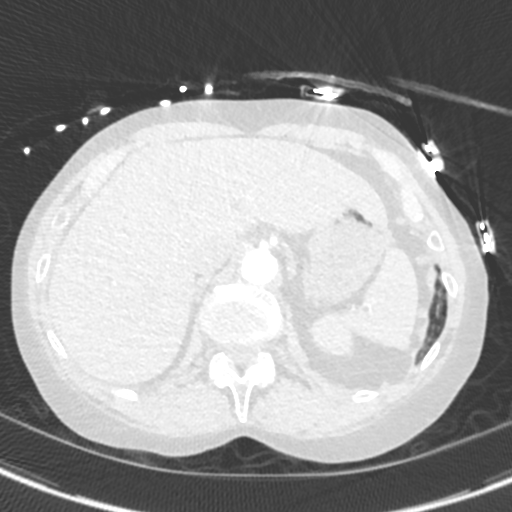
[im 51/453  soft-tissue]
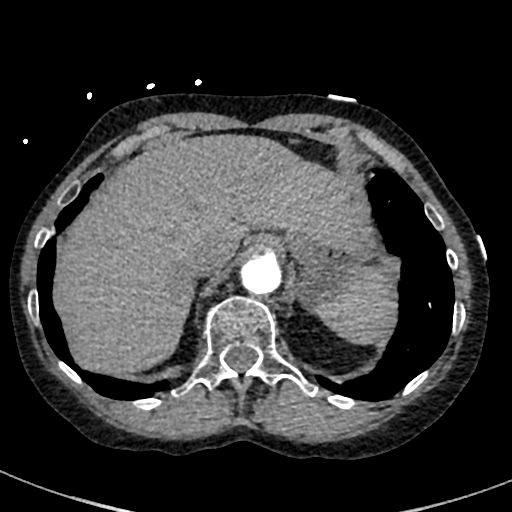
[im 76/453  lung]
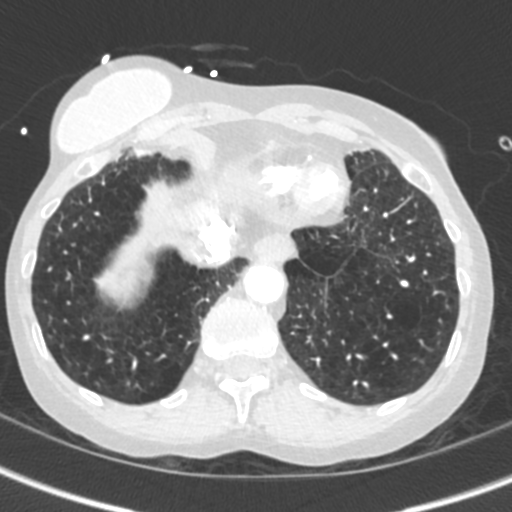
[im 101/453  soft-tissue]
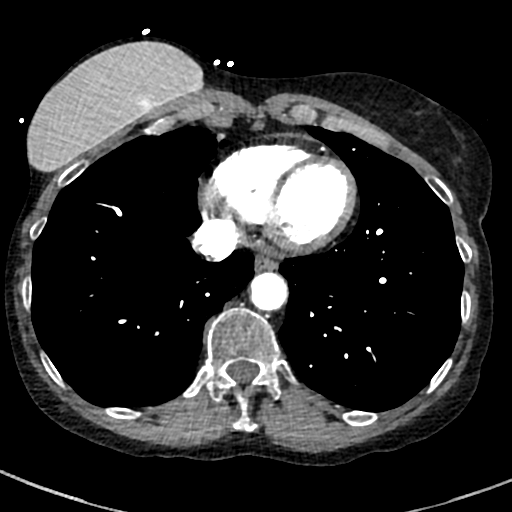
[im 126/453  lung]
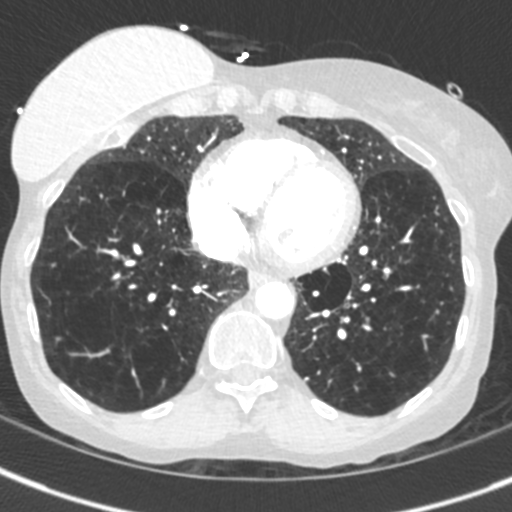
[im 151/453  soft-tissue]
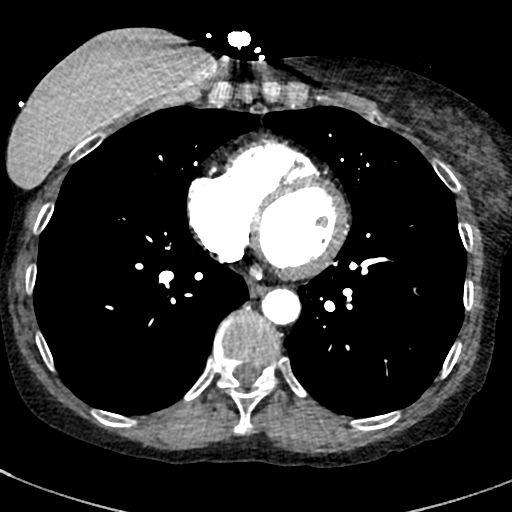
[im 176/453  lung]
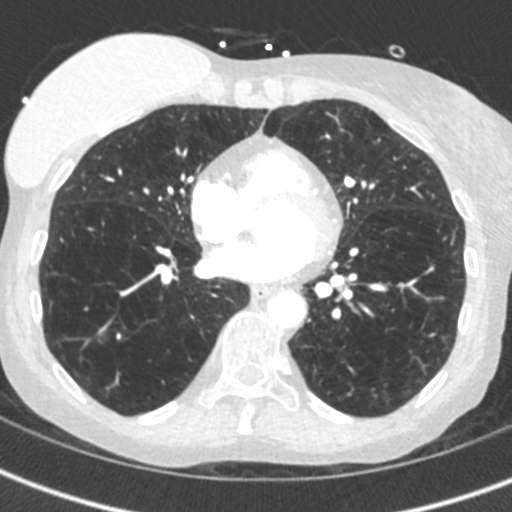
[im 201/453  soft-tissue]
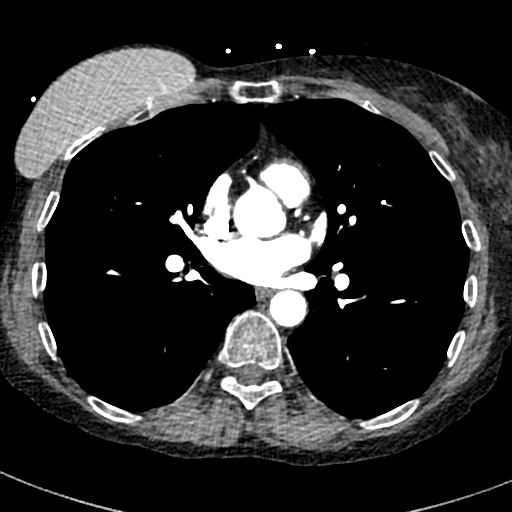
[im 252/453  lung]
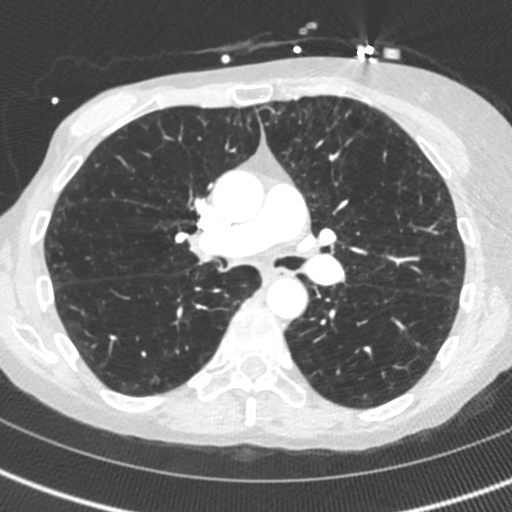
[im 277/453  soft-tissue]
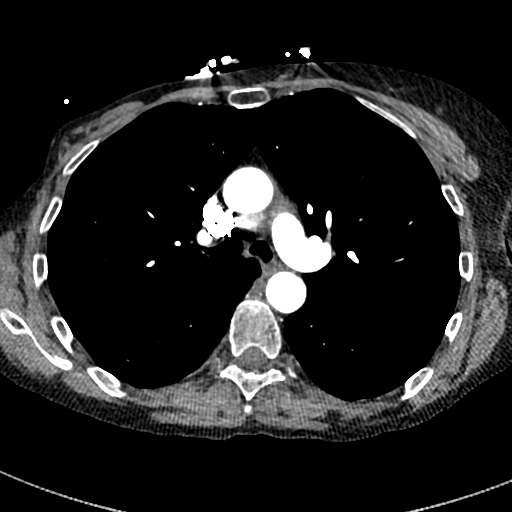
[im 302/453  lung]
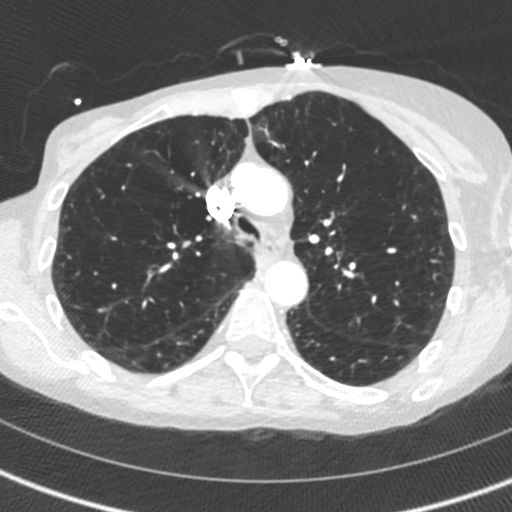
[im 327/453  soft-tissue]
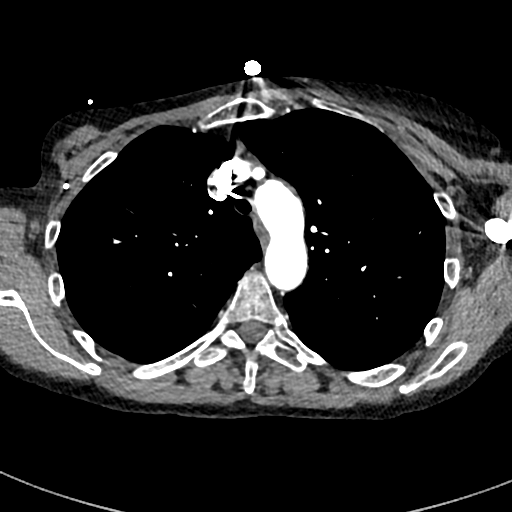
[im 352/453  lung]
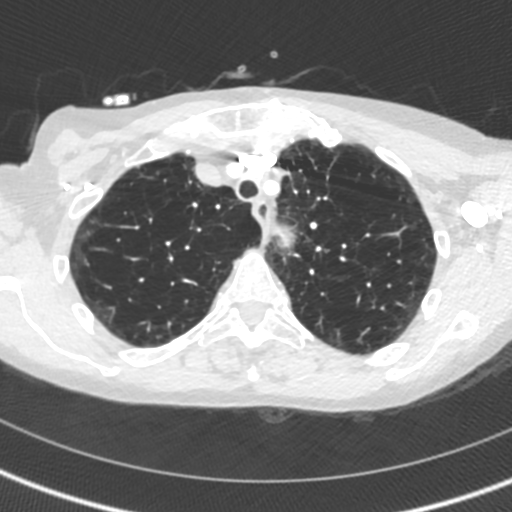
[im 377/453  soft-tissue]
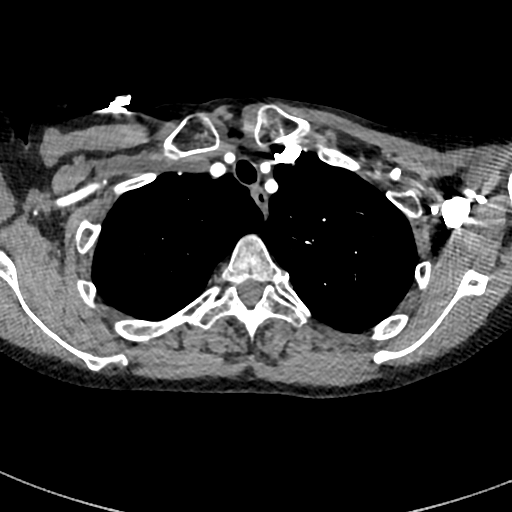
[im 402/453  lung]
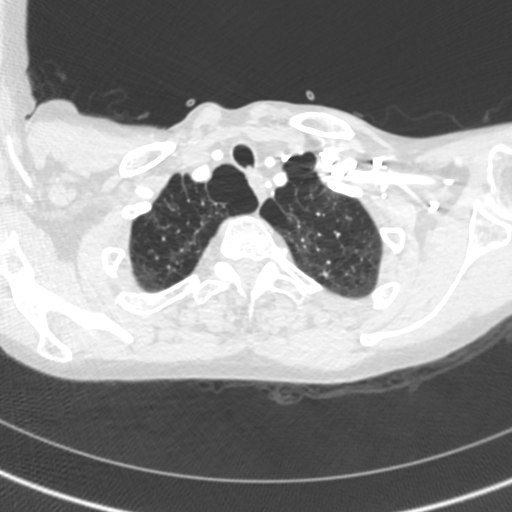
[im 427/453  soft-tissue]
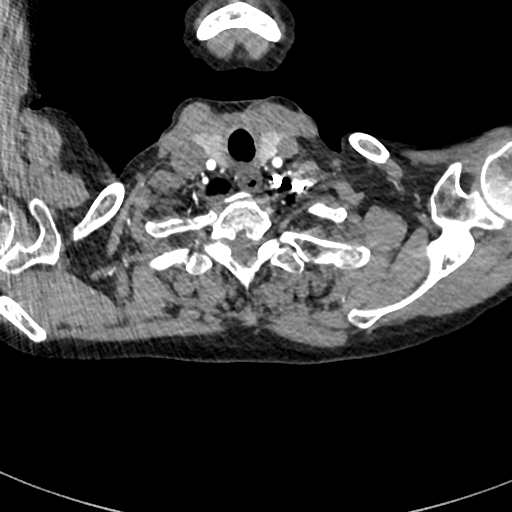

[Series 8: cor · coronal · 0.60mm/px · 3 of 112 slices shown]
[im 28/112  soft-tissue]
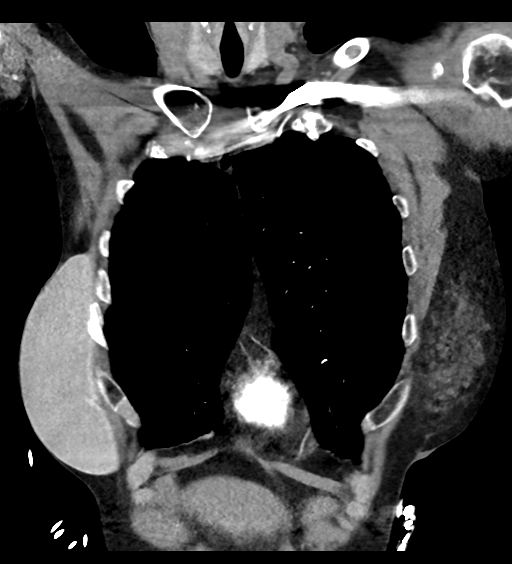
[im 56/112  soft-tissue]
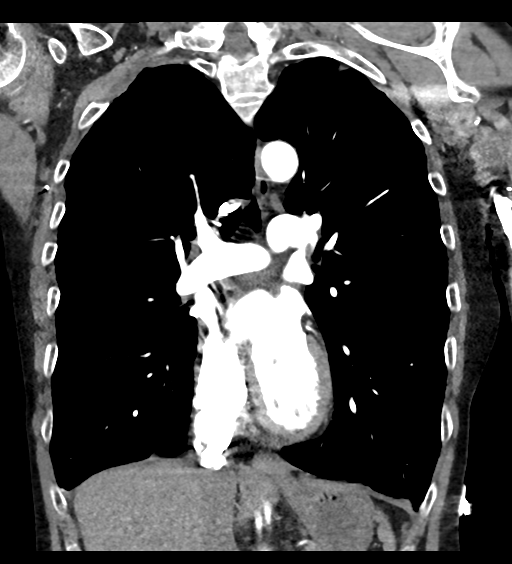
[im 84/112  soft-tissue]
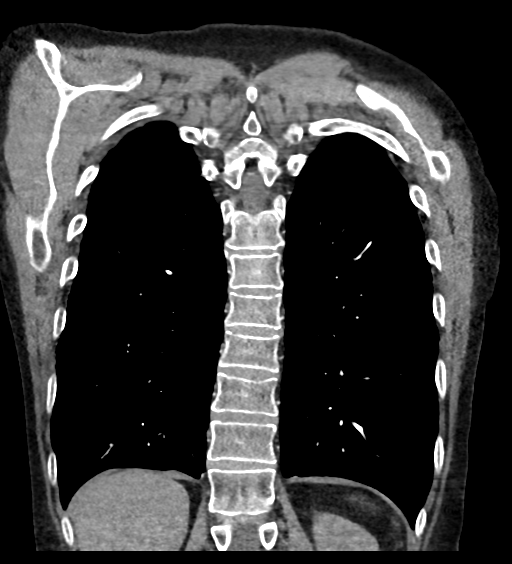

[19 of 46 positions shown; findings below may reference images not displayed]

FINDINGS: Cardiovascular: Satisfactory opacification of the pulmonary arteries
to the segmental level. No evidence of pulmonary embolism. Normal
heart size. No pericardial effusion.

Mediastinum/Nodes: Negative for adenopathy or mass

Lungs/Pleura: Advanced centrilobular/panlobular emphysema. Diffuse
airway thickening with airway collapse (bronchomalacia). There is no
edema, consolidation, effusion, or pneumothorax. Calcified pulmonary
nodules at the left lung base. No worrisome nodule

Upper Abdomen: No acute finding

Musculoskeletal: No acute or aggressive finding.

Other: Right mastectomy and axillary dissection. Right breast
reconstruction.

Review of the MIP images confirms the above findings.
IMPRESSION: 1. Negative for pulmonary embolism or other acute finding.
2. Advanced COPD

Aortic Atherosclerosis ([id number]).

## 2021-04-05 IMAGING — DX DG CHEST 1V PORT
1 series · 2 of 2 positions shown · non-contrast
Comparison: April 21, 2020

CLINICAL DATA: Shortness of breath.

EXAM:
PORTABLE CHEST 1 VIEW

[Series 1: chest · 0.14mm/px · 2 of 2 slices shown]
[im 1/2]
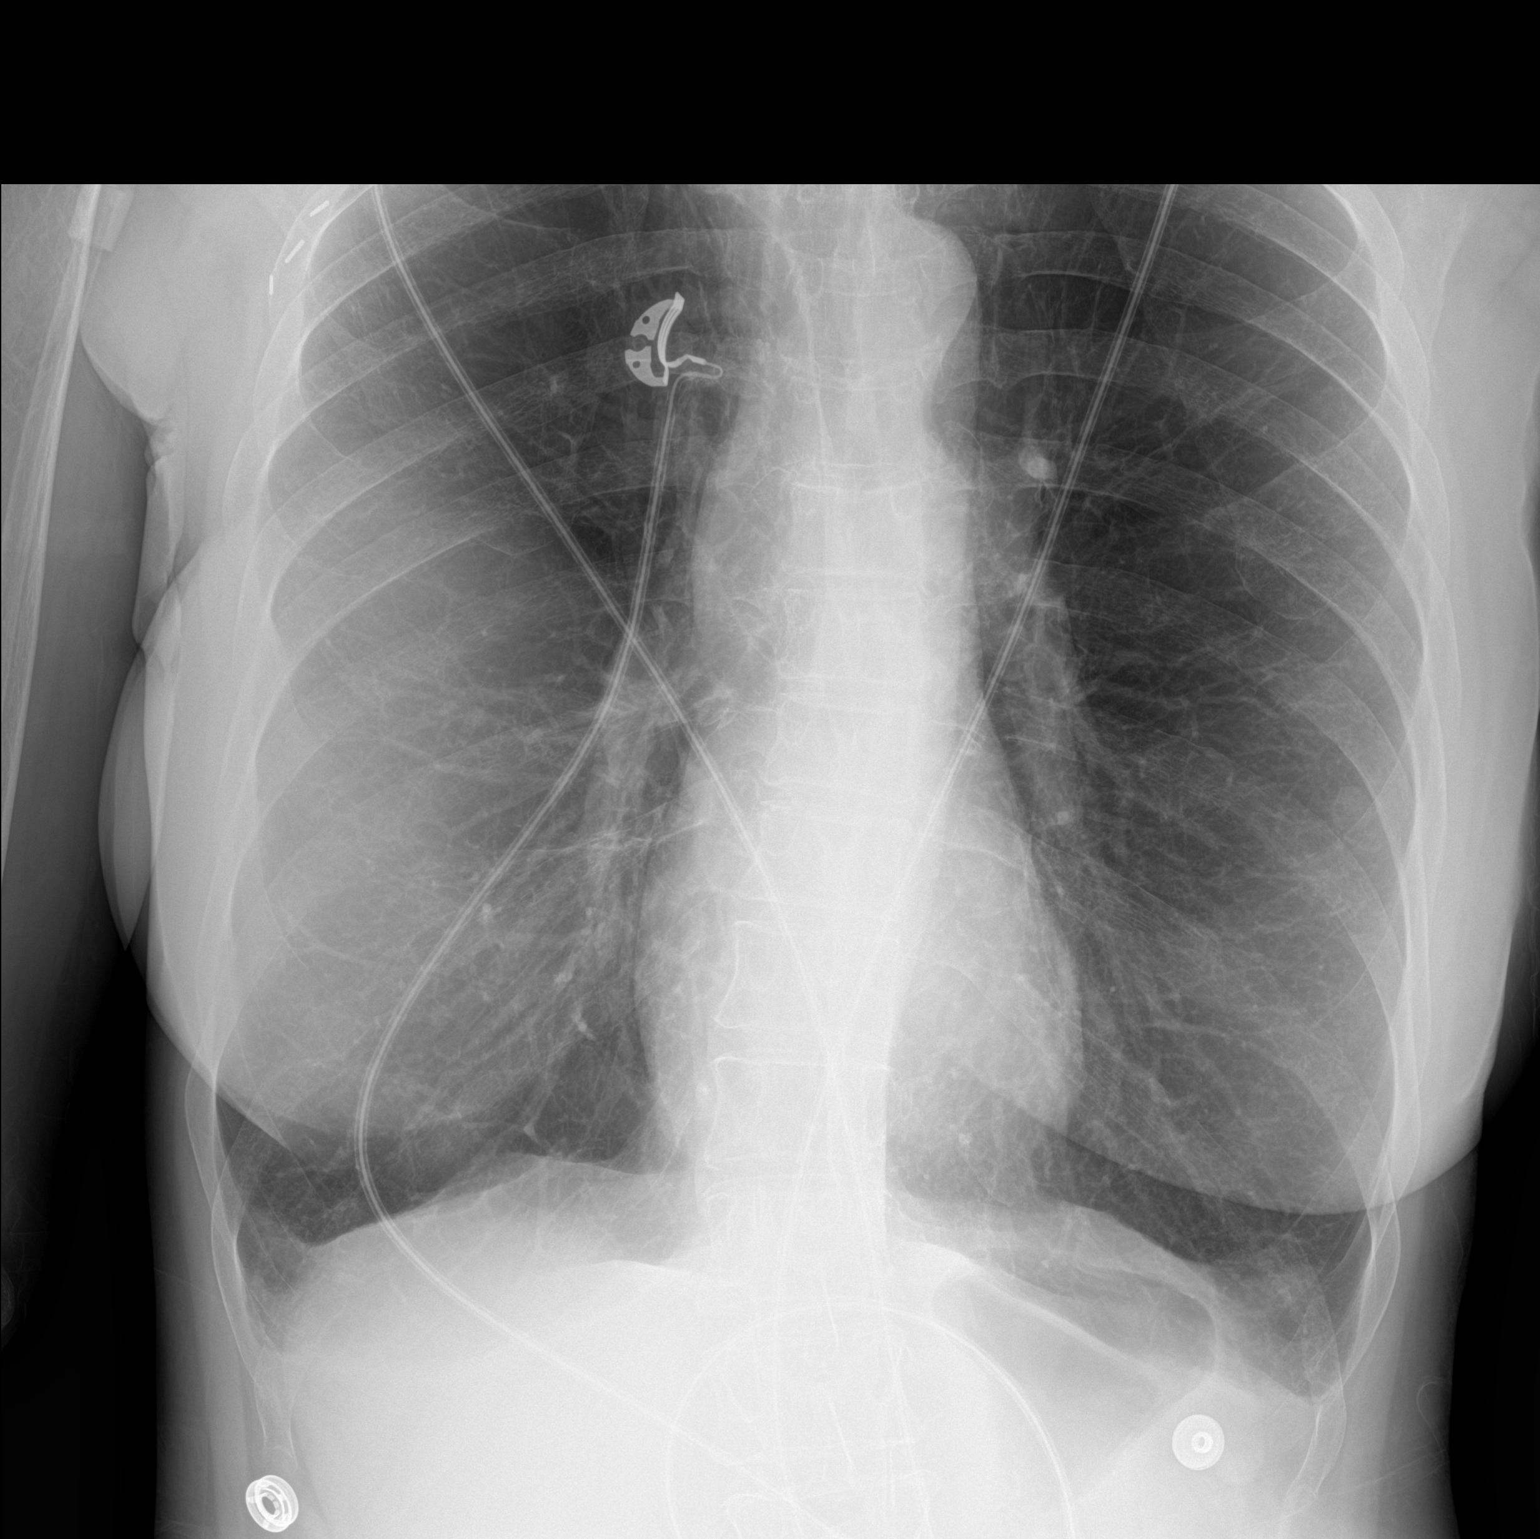
[im 2/2]
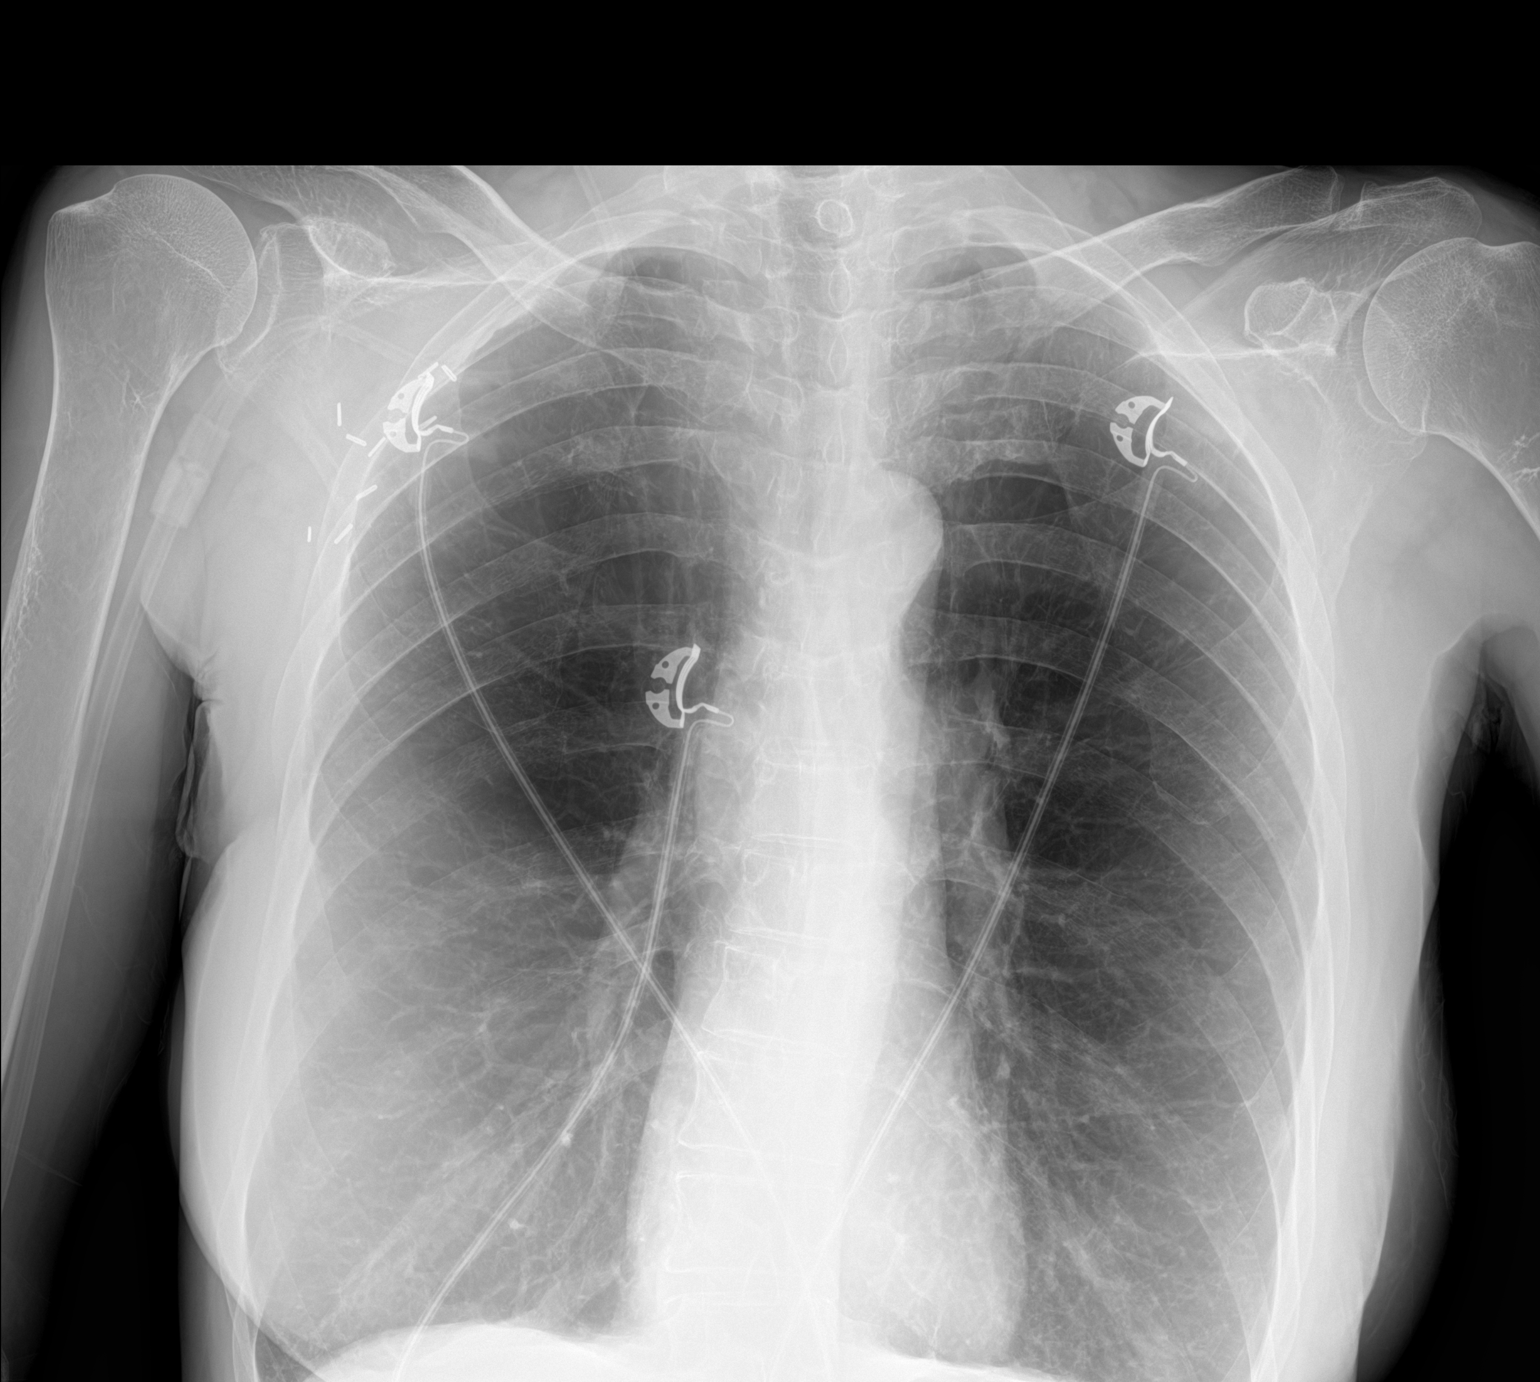

[2 of 2 positions shown; findings below may reference images not displayed]

FINDINGS: The heart size and mediastinal contours are within normal limits.
The lungs are hyperinflated. Both lungs are clear. Radiopaque
surgical clips are again seen overlying the lateral aspect of the
upper right hemithorax. The visualized skeletal structures are
unremarkable.
IMPRESSION: No active disease.
